# Patient Record
Sex: Male | Born: 1960 | Race: White | Hispanic: No | Marital: Single | Smoking: Current every day smoker
Health system: Southern US, Community
[De-identification: ages and names within clinical notes are randomized; demographics above are authoritative.]

## PROBLEM LIST (undated history)

## (undated) DIAGNOSIS — R519 Headache, unspecified: Secondary | ICD-10-CM

## (undated) DIAGNOSIS — K2210 Ulcer of esophagus without bleeding: Secondary | ICD-10-CM

## (undated) DIAGNOSIS — R972 Elevated prostate specific antigen [PSA]: Secondary | ICD-10-CM

## (undated) DIAGNOSIS — D126 Benign neoplasm of colon, unspecified: Secondary | ICD-10-CM

## (undated) DIAGNOSIS — K219 Gastro-esophageal reflux disease without esophagitis: Secondary | ICD-10-CM

## (undated) DIAGNOSIS — I2510 Atherosclerotic heart disease of native coronary artery without angina pectoris: Secondary | ICD-10-CM

## (undated) DIAGNOSIS — Z720 Tobacco use: Secondary | ICD-10-CM

## (undated) DIAGNOSIS — I739 Peripheral vascular disease, unspecified: Secondary | ICD-10-CM

## (undated) DIAGNOSIS — M1990 Unspecified osteoarthritis, unspecified site: Secondary | ICD-10-CM

## (undated) DIAGNOSIS — Z8719 Personal history of other diseases of the digestive system: Secondary | ICD-10-CM

## (undated) DIAGNOSIS — J449 Chronic obstructive pulmonary disease, unspecified: Secondary | ICD-10-CM

## (undated) DIAGNOSIS — R634 Abnormal weight loss: Secondary | ICD-10-CM

## (undated) DIAGNOSIS — E119 Type 2 diabetes mellitus without complications: Secondary | ICD-10-CM

## (undated) DIAGNOSIS — E785 Hyperlipidemia, unspecified: Secondary | ICD-10-CM

## (undated) DIAGNOSIS — M549 Dorsalgia, unspecified: Secondary | ICD-10-CM

## (undated) DIAGNOSIS — I10 Essential (primary) hypertension: Secondary | ICD-10-CM

## (undated) DIAGNOSIS — Z8739 Personal history of other diseases of the musculoskeletal system and connective tissue: Secondary | ICD-10-CM

## (undated) HISTORY — PX: LUMBAR DISC SURGERY: SHX700

## (undated) HISTORY — DX: Ulcer of esophagus without bleeding: K22.10

## (undated) HISTORY — DX: Peripheral vascular disease, unspecified: I73.9

## (undated) HISTORY — PX: BACK SURGERY: SHX140

## (undated) HISTORY — DX: Tobacco use: Z72.0

## (undated) HISTORY — PX: SHOULDER SURGERY: SHX246

## (undated) HISTORY — PX: LUMBAR FUSION: SHX111

## (undated) HISTORY — PX: PELVIC ABCESS DRAINAGE: SHX2189

## (undated) HISTORY — DX: Hyperlipidemia, unspecified: E78.5

## (undated) HISTORY — PX: TOTAL HIP ARTHROPLASTY: SHX124

## (undated) HISTORY — DX: Elevated prostate specific antigen (PSA): R97.20

## (undated) HISTORY — DX: Abnormal weight loss: R63.4

## (undated) HISTORY — DX: Chronic obstructive pulmonary disease, unspecified: J44.9

## (undated) HISTORY — DX: Dorsalgia, unspecified: M54.9

## (undated) HISTORY — DX: Benign neoplasm of colon, unspecified: D12.6

## (undated) HISTORY — DX: Atherosclerotic heart disease of native coronary artery without angina pectoris: I25.10

---

## 2001-05-15 ENCOUNTER — Emergency Department (HOSPITAL_COMMUNITY): Admission: EM | Admit: 2001-05-15 | Discharge: 2001-05-15 | Payer: Self-pay | Admitting: Emergency Medicine

## 2001-05-15 ENCOUNTER — Encounter: Payer: Self-pay | Admitting: Emergency Medicine

## 2001-05-16 ENCOUNTER — Encounter (HOSPITAL_COMMUNITY): Admission: RE | Admit: 2001-05-16 | Discharge: 2001-06-15 | Payer: Self-pay | Admitting: Emergency Medicine

## 2001-05-23 ENCOUNTER — Ambulatory Visit (HOSPITAL_COMMUNITY): Admission: RE | Admit: 2001-05-23 | Discharge: 2001-05-23 | Payer: Self-pay | Admitting: Internal Medicine

## 2001-05-24 DIAGNOSIS — K2210 Ulcer of esophagus without bleeding: Secondary | ICD-10-CM

## 2001-05-24 HISTORY — DX: Ulcer of esophagus without bleeding: K22.10

## 2001-05-24 HISTORY — PX: ESOPHAGOGASTRODUODENOSCOPY: SHX1529

## 2001-06-12 ENCOUNTER — Encounter: Admission: RE | Admit: 2001-06-12 | Discharge: 2001-09-10 | Payer: Self-pay | Admitting: Pulmonary Disease

## 2001-07-15 ENCOUNTER — Encounter: Payer: Self-pay | Admitting: Emergency Medicine

## 2001-07-15 ENCOUNTER — Emergency Department (HOSPITAL_COMMUNITY): Admission: EM | Admit: 2001-07-15 | Discharge: 2001-07-15 | Payer: Self-pay | Admitting: Emergency Medicine

## 2001-10-06 ENCOUNTER — Ambulatory Visit (HOSPITAL_COMMUNITY): Admission: RE | Admit: 2001-10-06 | Discharge: 2001-10-06 | Payer: Self-pay | Admitting: Pulmonary Disease

## 2001-10-17 ENCOUNTER — Ambulatory Visit (HOSPITAL_COMMUNITY): Admission: RE | Admit: 2001-10-17 | Discharge: 2001-10-17 | Payer: Self-pay | Admitting: Pulmonary Disease

## 2001-10-22 ENCOUNTER — Encounter: Payer: Self-pay | Admitting: Neurosurgery

## 2001-10-24 ENCOUNTER — Observation Stay (HOSPITAL_COMMUNITY): Admission: RE | Admit: 2001-10-24 | Discharge: 2001-10-25 | Payer: Self-pay | Admitting: Neurosurgery

## 2001-10-24 ENCOUNTER — Encounter: Payer: Self-pay | Admitting: Neurosurgery

## 2002-01-02 ENCOUNTER — Ambulatory Visit (HOSPITAL_COMMUNITY): Admission: RE | Admit: 2002-01-02 | Discharge: 2002-01-02 | Payer: Self-pay | Admitting: Neurosurgery

## 2002-01-02 ENCOUNTER — Encounter: Payer: Self-pay | Admitting: Neurosurgery

## 2002-01-08 ENCOUNTER — Encounter (HOSPITAL_COMMUNITY): Admission: RE | Admit: 2002-01-08 | Discharge: 2002-02-07 | Payer: Self-pay | Admitting: Neurosurgery

## 2002-03-23 ENCOUNTER — Emergency Department (HOSPITAL_COMMUNITY): Admission: EM | Admit: 2002-03-23 | Discharge: 2002-03-23 | Payer: Self-pay | Admitting: Emergency Medicine

## 2002-03-23 ENCOUNTER — Encounter: Payer: Self-pay | Admitting: Emergency Medicine

## 2002-04-27 ENCOUNTER — Ambulatory Visit (HOSPITAL_COMMUNITY): Admission: RE | Admit: 2002-04-27 | Discharge: 2002-04-27 | Payer: Self-pay | Admitting: Neurosurgery

## 2002-04-27 ENCOUNTER — Encounter: Payer: Self-pay | Admitting: Neurosurgery

## 2002-06-17 ENCOUNTER — Encounter: Payer: Self-pay | Admitting: Neurosurgery

## 2002-06-17 ENCOUNTER — Encounter: Admission: RE | Admit: 2002-06-17 | Discharge: 2002-06-17 | Payer: Self-pay | Admitting: Neurosurgery

## 2002-07-28 ENCOUNTER — Observation Stay (HOSPITAL_COMMUNITY): Admission: RE | Admit: 2002-07-28 | Discharge: 2002-07-29 | Payer: Self-pay | Admitting: Neurosurgery

## 2002-07-28 ENCOUNTER — Encounter: Payer: Self-pay | Admitting: Neurosurgery

## 2002-08-02 ENCOUNTER — Emergency Department (HOSPITAL_COMMUNITY): Admission: EM | Admit: 2002-08-02 | Discharge: 2002-08-02 | Payer: Self-pay | Admitting: Emergency Medicine

## 2002-08-02 ENCOUNTER — Encounter: Payer: Self-pay | Admitting: Emergency Medicine

## 2002-08-03 ENCOUNTER — Encounter: Payer: Self-pay | Admitting: Internal Medicine

## 2002-08-03 ENCOUNTER — Ambulatory Visit (HOSPITAL_COMMUNITY): Admission: RE | Admit: 2002-08-03 | Discharge: 2002-08-03 | Payer: Self-pay | Admitting: Internal Medicine

## 2002-09-09 ENCOUNTER — Ambulatory Visit (HOSPITAL_COMMUNITY): Admission: RE | Admit: 2002-09-09 | Discharge: 2002-09-09 | Payer: Self-pay | Admitting: Pulmonary Disease

## 2002-09-23 ENCOUNTER — Ambulatory Visit (HOSPITAL_COMMUNITY): Admission: RE | Admit: 2002-09-23 | Discharge: 2002-09-23 | Payer: Self-pay | Admitting: Neurosurgery

## 2002-09-23 ENCOUNTER — Encounter: Payer: Self-pay | Admitting: Neurosurgery

## 2002-10-07 ENCOUNTER — Ambulatory Visit (HOSPITAL_COMMUNITY): Admission: RE | Admit: 2002-10-07 | Discharge: 2002-10-07 | Payer: Self-pay | Admitting: Pulmonary Disease

## 2003-03-23 ENCOUNTER — Ambulatory Visit (HOSPITAL_COMMUNITY): Admission: RE | Admit: 2003-03-23 | Discharge: 2003-03-23 | Payer: Self-pay | Admitting: Neurosurgery

## 2003-03-23 ENCOUNTER — Encounter: Payer: Self-pay | Admitting: Neurosurgery

## 2003-03-28 ENCOUNTER — Encounter: Payer: Self-pay | Admitting: Emergency Medicine

## 2003-03-28 ENCOUNTER — Emergency Department (HOSPITAL_COMMUNITY): Admission: EM | Admit: 2003-03-28 | Discharge: 2003-03-28 | Payer: Self-pay | Admitting: Emergency Medicine

## 2003-07-07 ENCOUNTER — Encounter
Admission: RE | Admit: 2003-07-07 | Discharge: 2003-10-05 | Payer: Self-pay | Admitting: Physical Medicine and Rehabilitation

## 2004-02-01 ENCOUNTER — Ambulatory Visit (HOSPITAL_COMMUNITY): Admission: RE | Admit: 2004-02-01 | Discharge: 2004-02-01 | Payer: Self-pay | Admitting: Neurosurgery

## 2004-02-16 ENCOUNTER — Observation Stay (HOSPITAL_COMMUNITY): Admission: RE | Admit: 2004-02-16 | Discharge: 2004-02-17 | Payer: Self-pay | Admitting: Neurosurgery

## 2004-12-28 ENCOUNTER — Ambulatory Visit (HOSPITAL_COMMUNITY): Admission: RE | Admit: 2004-12-28 | Discharge: 2004-12-28 | Payer: Self-pay | Admitting: Neurosurgery

## 2005-01-25 ENCOUNTER — Encounter: Admission: RE | Admit: 2005-01-25 | Discharge: 2005-01-25 | Payer: Self-pay | Admitting: Neurosurgery

## 2005-04-11 ENCOUNTER — Emergency Department (HOSPITAL_COMMUNITY): Admission: EM | Admit: 2005-04-11 | Discharge: 2005-04-11 | Payer: Self-pay | Admitting: Emergency Medicine

## 2005-06-08 ENCOUNTER — Ambulatory Visit (HOSPITAL_COMMUNITY): Admission: RE | Admit: 2005-06-08 | Discharge: 2005-06-08 | Payer: Self-pay | Admitting: Pulmonary Disease

## 2005-09-28 ENCOUNTER — Ambulatory Visit (HOSPITAL_COMMUNITY): Admission: RE | Admit: 2005-09-28 | Discharge: 2005-09-28 | Payer: Self-pay | Admitting: Neurosurgery

## 2005-11-06 ENCOUNTER — Ambulatory Visit (HOSPITAL_COMMUNITY): Admission: RE | Admit: 2005-11-06 | Discharge: 2005-11-06 | Payer: Self-pay | Admitting: Neurosurgery

## 2006-02-25 ENCOUNTER — Ambulatory Visit (HOSPITAL_COMMUNITY): Admission: RE | Admit: 2006-02-25 | Discharge: 2006-02-25 | Payer: Self-pay | Admitting: Pulmonary Disease

## 2008-09-13 ENCOUNTER — Ambulatory Visit (HOSPITAL_COMMUNITY): Admission: RE | Admit: 2008-09-13 | Discharge: 2008-09-13 | Payer: Self-pay | Admitting: Pulmonary Disease

## 2008-10-04 ENCOUNTER — Ambulatory Visit (HOSPITAL_COMMUNITY): Admission: RE | Admit: 2008-10-04 | Discharge: 2008-10-04 | Payer: Self-pay | Admitting: Neurosurgery

## 2008-10-12 ENCOUNTER — Inpatient Hospital Stay (HOSPITAL_COMMUNITY): Admission: RE | Admit: 2008-10-12 | Discharge: 2008-10-14 | Payer: Self-pay | Admitting: Neurosurgery

## 2008-11-17 ENCOUNTER — Encounter (HOSPITAL_COMMUNITY): Admission: RE | Admit: 2008-11-17 | Discharge: 2008-12-17 | Payer: Self-pay | Admitting: Neurosurgery

## 2009-03-11 ENCOUNTER — Emergency Department (HOSPITAL_COMMUNITY): Admission: EM | Admit: 2009-03-11 | Discharge: 2009-03-11 | Payer: Self-pay | Admitting: Internal Medicine

## 2009-07-13 ENCOUNTER — Ambulatory Visit (HOSPITAL_COMMUNITY): Admission: RE | Admit: 2009-07-13 | Discharge: 2009-07-13 | Payer: Self-pay | Admitting: Orthopedic Surgery

## 2009-09-26 ENCOUNTER — Emergency Department (HOSPITAL_COMMUNITY): Admission: EM | Admit: 2009-09-26 | Discharge: 2009-09-26 | Payer: Self-pay | Admitting: Emergency Medicine

## 2010-08-27 ENCOUNTER — Encounter: Payer: Self-pay | Admitting: Orthopedic Surgery

## 2010-08-27 ENCOUNTER — Encounter: Payer: Self-pay | Admitting: Neurosurgery

## 2010-09-13 ENCOUNTER — Inpatient Hospital Stay (HOSPITAL_COMMUNITY)
Admission: EM | Admit: 2010-09-13 | Discharge: 2010-09-17 | DRG: 728 | Disposition: A | Discharge status: Home Health | Payer: Medicare Other | Attending: Pulmonary Disease | Admitting: Pulmonary Disease

## 2010-09-13 DIAGNOSIS — J449 Chronic obstructive pulmonary disease, unspecified: Secondary | ICD-10-CM | POA: Diagnosis present

## 2010-09-13 DIAGNOSIS — M542 Cervicalgia: Secondary | ICD-10-CM | POA: Diagnosis present

## 2010-09-13 DIAGNOSIS — J4489 Other specified chronic obstructive pulmonary disease: Secondary | ICD-10-CM | POA: Diagnosis present

## 2010-09-13 DIAGNOSIS — N498 Inflammatory disorders of other specified male genital organs: Principal | ICD-10-CM | POA: Diagnosis present

## 2010-09-13 DIAGNOSIS — G8929 Other chronic pain: Secondary | ICD-10-CM | POA: Diagnosis present

## 2010-09-13 DIAGNOSIS — M549 Dorsalgia, unspecified: Secondary | ICD-10-CM | POA: Diagnosis present

## 2010-09-13 DIAGNOSIS — E119 Type 2 diabetes mellitus without complications: Secondary | ICD-10-CM | POA: Diagnosis present

## 2010-09-13 DIAGNOSIS — F17200 Nicotine dependence, unspecified, uncomplicated: Secondary | ICD-10-CM | POA: Diagnosis present

## 2010-09-13 LAB — URINALYSIS, ROUTINE W REFLEX MICROSCOPIC
Hgb urine dipstick: NEGATIVE
Specific Gravity, Urine: 1.015 (ref 1.005–1.030)
Urine Glucose, Fasting: >1000 mg/dL — AB
Urobilinogen, UA: 0.2 mg/dL (ref 0.0–1.0)
pH: 5.5 (ref 5.0–8.0)

## 2010-09-13 LAB — CBC
HCT: 42.7 % (ref 39.0–52.0)
RDW: 12.4 % (ref 11.5–15.5)

## 2010-09-13 LAB — DIFFERENTIAL
Basophils Relative: 0 % (ref 0–1)
Eosinophils Absolute: 0.2 10*3/uL (ref 0.0–0.7)
Eosinophils Relative: 2 % (ref 0–5)
Lymphocytes Relative: 21 % (ref 12–46)

## 2010-09-13 LAB — URINE MICROSCOPIC-ADD ON

## 2010-09-13 LAB — BASIC METABOLIC PANEL
Calcium: 9.1 mg/dL (ref 8.4–10.5)
GFR calc Af Amer: >60 mL/min (ref >60)
GFR calc non Af Amer: >60 mL/min (ref >60)
Glucose, Bld: 241 mg/dL — ABNORMAL HIGH (ref 70–99)
Sodium: 134 mEq/L — ABNORMAL LOW (ref 135–145)

## 2010-09-14 LAB — GLUCOSE, CAPILLARY
Glucose-Capillary: 333 mg/dL — ABNORMAL HIGH (ref 70–99)
Glucose-Capillary: 271 mg/dL — ABNORMAL HIGH (ref 70–99)

## 2010-09-15 LAB — GLUCOSE, CAPILLARY
Glucose-Capillary: 196 mg/dL — ABNORMAL HIGH (ref 70–99)
Glucose-Capillary: 224 mg/dL — ABNORMAL HIGH (ref 70–99)
Glucose-Capillary: 183 mg/dL — ABNORMAL HIGH (ref 70–99)
Glucose-Capillary: 280 mg/dL — ABNORMAL HIGH (ref 70–99)

## 2010-09-15 LAB — SURGICAL PCR SCREEN: Staphylococcus aureus: NEGATIVE

## 2010-09-16 LAB — GLUCOSE, CAPILLARY: Glucose-Capillary: 231 mg/dL — ABNORMAL HIGH (ref 70–99)

## 2010-09-17 LAB — CULTURE, ROUTINE-ABSCESS

## 2010-09-19 LAB — CULTURE, BLOOD (ROUTINE X 2): Culture: NO GROWTH

## 2010-09-20 LAB — ANAEROBIC CULTURE

## 2010-09-27 NOTE — Op Note (Signed)
  NAMEAADIN, GAUT                 ACCOUNT NO.:  [id number]  MEDICAL RECORD NO.:  [id number]           PATIENT TYPE:  I  LOCATION:  A308                          FACILITY:  [facility]  PHYSICIAN:  Ky Barban, M.D.DATE OF BIRTH:  Dec 05, 1960  DATE OF PROCEDURE: DATE OF DISCHARGE:                              OPERATIVE REPORT   PREOPERATIVE DIAGNOSIS:  Left scrotal abscess.  POSTOPERATIVE DIAGNOSIS:  Left scrotal abscess.  PROCEDURE:  I and D of scrotal abscess.  ANESTHESIA:  General.  PROCEDURE IN DETAIL:  The patient under general anesthesia after usual prep and drape.  The fluctuant mass which is in the left scrotal upper end was incised, purulent material about 10 mL came out, sent for cultures.  There is lot of induration around it, but no other collection of abscess.  The wound was packed.  Dressing applied.  The patient left the operating room in satisfactory condition.     Ky Barban, M.D.     MIJ/MEDQ  D:  09/15/2010  T:  09/16/2010  Job:  [id number]  Electronically Signed by Alleen Borne M.D. on 09/27/2010 11:56:28 AM

## 2010-09-27 NOTE — Consult Note (Signed)
  [name], [name]                 ACCOUNT NO.:  [id number]  MEDICAL RECORD NO.:  [id number]           PATIENT TYPE:  LOCATION:                                 FACILITY:  PHYSICIAN:  Ky Barban, M.D.    DATE OF BIRTH:  DATE OF CONSULTATION:  09/14/2010 DATE OF DISCHARGE:                                CONSULTATION   CHIEF COMPLAINT:  Left scrotal abscess.  HISTORY:  A 50 year old gentleman presented with swelling of his left hemiscrotum over the last 2-3 days, came to the emergency room, he is diabetic, takes oral medications, and he is admitted in the hospital for further management and I was asked to see him today.  No fever, chills or history of any urological problem; only problem he has non-insulin dependent diabetes.  PHYSICAL EXAMINATION:  VITAL SIGNS:  Blood pressure 150/80 and temperature is normal. ABDOMEN:  Soft, flat.  Liver, spleen, kidneys not palpable.  No CVA tenderness. EXTERNAL GENITALIA:  Circumcised, meatus adequate.  Testicles feeling normal.  There is tenderness and swelling of the left scrotum near its junction with the left thigh and there is a fluctuant mass with some redness on top and draining sinus.  Testicle appears to be normal. RECTAL:  Deferred.  LAB DATA:  WBC count is 11,000, hematocrit 42.7.  PAST SURGICAL HISTORY:  Surgery on his right shoulder.  SOCIAL HISTORY:  He does smoke, occasionally drinks.  IMPRESSION:  Left scrotal abscess.  Culture from the site has been done which is pending and I want to give him antibiotic today.  We just started him on insulin also.  I will do an incision and drainage of this abscess, although it is not a big abscess, but it needs to be drained, so that it can start healing.  He has been started on vancomycin. Continue vancomycin until the culture report comes back.  I appreciate Dr. Juanetta Gosling for letting to see this patient.     Ky Barban, M.D.     MIJ/MEDQ  D:  09/14/2010  T:   09/15/2010  Job:  [id number]  cc:   Ramon Dredge L. Juanetta Gosling, M.D. Fax: [phone]  Electronically Signed by Alleen Borne M.D. on 09/27/2010 11:56:24 AM

## 2010-10-02 NOTE — Progress Notes (Signed)
  NAMEAVROHOM, MCKELVIN                 ACCOUNT NO.:  [id number]  MEDICAL RECORD NO.:  [id number]           PATIENT TYPE:  LOCATION:                                 FACILITY:  PHYSICIAN:  Lawernce Earll L. Juanetta Gosling, M.D.DATE OF BIRTH:  1961-07-19  DATE OF PROCEDURE: DATE OF DISCHARGE:                                PROGRESS NOTE   Mr. Towell says he feels great and wants to go home.  He has had very little drainage from his wound.  His exam shows temperature is 97.7, pulse 61, respirations 18, blood pressure 153/83, and O2 sats 90% on room air.  His chest is clear.  His heart is regular.  His abdomen is soft.  ASSESSMENT:  I think it is okay for him to be discharged.  He will have daily dressings by [facility], continue with his meds and treatments. I have already done his discharge summary yesterday.  There are no changes.     Wayne Wicklund L. Juanetta Gosling, M.D.     ELH/MEDQ  D:  09/17/2010  T:  09/18/2010  Job:  [id number]  Electronically Signed by Kari Baars M.D. on 10/02/2010 08:48:15 AM

## 2010-10-02 NOTE — Discharge Summary (Signed)
  NAMELATRAVION, GRAVES                 ACCOUNT NO.:  [id number]  MEDICAL RECORD NO.:  [id number]           PATIENT TYPE:  I  LOCATION:  A308                          FACILITY:  [facility]  PHYSICIAN:  Graysen Woodyard L. Juanetta Gosling, M.D.DATE OF BIRTH:  1961-01-11  DATE OF ADMISSION:  09/13/2010 DATE OF DISCHARGE:  LH                              DISCHARGE SUMMARY   HISTORY:  Mr. Mages is a 50 year old who came to the emergency room because of scrotal abscess.  FINAL DISCHARGE DIAGNOSES: 1. Recurrent scrotal abscess. 2. Diabetes. 3. Chronic back and neck pain, status post multiple surgeries. 4. Chronic obstructive pulmonary disease. 5. History of osteoarthritis of the shoulder, status post surgery.  Mr. Eberwein is a 50 year old who came to the emergency room with a scrotal abscess.  He has had these in the past.  He had called my office about 2 or 3 days ago, was started on doxycycline because he was not able to get to the office, but he has continued having trouble, came to the emergency room and was found to have a scrotal abscess.  His exam showed a well-developed, well-nourished male, in no acute distress.  His temperature was 101.  His neck was supple.  His chest was relatively clear with some rhonchi.  Heart was regular in abscessing his scrotum.  Assessment then is that he had a scrotal abscess.  He was started on intravenous antibiotics, had consultation with Dr. Jerre Simon, the urologist and was taken to surgery with drainage of the abscess on the pants.  He has much improved since then and is being discharged home on Augmentin 875 mg b.i.d. x7 days, Actos 45 mg daily, Crestor 20 mg daily, glyburide 5 mg t.i.d., metformin 2 tablets b.i.d. 500 mg and Tylox 5/500 four times a day as needed for pain.  He is learned how to give himself injections of insulin while he was in the hospital as needed and he may need to do that at a later date.  At this point, he is much better and he is being discharged  home in improved condition.     Louden Houseworth L. Juanetta Gosling, M.D.     ELH/MEDQ  D:  09/16/2010  T:  09/17/2010  Job:  [id number]  Electronically Signed by Kari Baars M.D. on 10/02/2010 08:47:55 AM

## 2010-10-02 NOTE — Progress Notes (Signed)
  NAMEJAIDEEP, POLLACK                 ACCOUNT NO.:  [id number]  MEDICAL RECORD NO.:  [id number]           PATIENT TYPE:  I  LOCATION:  A308                          FACILITY:  [facility]  PHYSICIAN:  Daquana Paddock L. Juanetta Gosling, M.D.DATE OF BIRTH:  26-May-1961  DATE OF PROCEDURE: DATE OF DISCHARGE:                                PROGRESS NOTE   Mr. Dingee was admitted last night with what appears to be a scrotal abscess.  He is diabetic, has chronic low back pain, he has some COPD. He says he feels a little better.  He is still having significant pain.  PHYSICAL EXAMINATION:  GENERAL:  His exam shows that he looks a little more comfortable. VITAL SIGNS:  His temperature is 97.5, pulse 57, respirations 18, blood pressure 126/75, O2 sat is 92% on room air. CHEST:  Clear.  He does look more comfortable.  ASSESSMENT:  I think he is better, but still with problems.  He has diabetes, which is being treated.  He has chronic obstructive pulmonary disease, tobacco use disorder.  The abscess, and I have asked for Urology consultation.     Yoshiye Kraft L. Juanetta Gosling, M.D.     ELH/MEDQ  D:  09/14/2010  T:  09/15/2010  Job:  [id number]  Electronically Signed by Kari Baars M.D. on 10/02/2010 08:48:03 AM

## 2010-10-02 NOTE — Progress Notes (Signed)
  [name], [name]                 ACCOUNT NO.:  [id number]  MEDICAL RECORD NO.:  [id number]           PATIENT TYPE:  I  LOCATION:  A308                          FACILITY:  [facility]  PHYSICIAN:  Carlesha Seiple L. Juanetta Gosling, M.D.DATE OF BIRTH:  28-Jan-1961  DATE OF PROCEDURE:  09/15/2010 DATE OF DISCHARGE:                                PROGRESS NOTE   [name] is set for his surgery.  He is to have drainage of the scrotal abscess.  He has no new complaints.  Says he is feeling okay.  He is still having some pain in his groin.  His exam shows his temperature is 97.8, pulse 72, respirations 16, blood pressure 136/86, and O2 sats 98% on room air.  He is awake and alert, looks fairly comfortable.  His chest shows rhonchi bilaterally.  His heart is regular without gallop. His abdomen is soft.  ASSESSMENT:  I think he is overall somewhat better.  He is continuing to receive IV antibiotics, etc.  He is going to have surgery later today.     Linton Stolp L. Juanetta Gosling, M.D.     ELH/MEDQ  D:  09/16/2010  T:  09/16/2010  Job:  [id number]  Electronically Signed by Kari Baars M.D. on 10/02/2010 08:48:07 AM

## 2010-10-02 NOTE — Progress Notes (Signed)
  NAMELINVILLE, DECAROLIS                 ACCOUNT NO.:  [id number]  MEDICAL RECORD NO.:  [id number]           PATIENT TYPE:  LOCATION:                                 FACILITY:  PHYSICIAN:  Daimien Patmon L. Juanetta Gosling, M.D.DATE OF BIRTH:  10/18/1960  DATE OF PROCEDURE: DATE OF DISCHARGE:                                PROGRESS NOTE   [name] is better, he says.  He is hopeful that he is going to be discharged home and I will go ahead and make arrangements in case Dr. Jerre Simon thinks it is okay for him to go.  I am not sure how long he is going to remain in the hospital after his surgery, so depending on what Dr. Rudean Haskell opinion is, I may have him able to be discharged later today.  He says he feels well.  His blood sugars are better.  He has been doing diabetic teaching and he has learned how to give himself insulin if that is needed at home.  He has much less problems with his sugar now.  His breathing is doing okay.  He has no other new complaints.  His exam shows that he is awake and alert.  His chest is clear.  Heart is regular.  His abdomen is soft.  I did not examine his surgical wound.  ASSESSMENT:  He had surgery for an abscess in his scrotum.  He seems to be doing okay and I do not think there is anything to add at this point. He has diabetes which is stable.  He has COPD which is stable.  My plan then is going to be to have him ready for discharge if he is okay.  Continue with everything else and follow him either at home or here in the hospital depending on whether he is discharged or not.     [name] L. Juanetta Gosling, M.D.     ELH/MEDQ  D:  09/16/2010  T:  09/17/2010  Job:  [id number]  Electronically Signed by Kari Baars M.D. on 10/02/2010 08:48:11 AM

## 2010-10-02 NOTE — H&P (Signed)
  [name], [name]                 ACCOUNT NO.:  [id number]  MEDICAL RECORD NO.:  [id number]           PATIENT TYPE:  I  LOCATION:  A308                          FACILITY:  [facility]  PHYSICIAN:  Anivea Velasques L. Juanetta Gosling, M.D.DATE OF BIRTH:  October 31, 1960  DATE OF ADMISSION:  09/13/2010 DATE OF DISCHARGE:  LH                             HISTORY & PHYSICAL   REASON FOR ADMISSION:  Scrotal abscess.  HISTORY:  [name] is a 50 year old who came to the emergency room because of a scrotal abscess.  He has had this for several days.  He called my office, was advised to come in and was started on doxycycline in the meantime.  Yesterday, he said he got up and his abscess burst so we thought it did not need any further attention, but then throughout today he had more fevers, his blood sugar has gone and he has had increasing problems with his blood sugar.  He says it is very painful.  His past medical history is positive for diabetes, chronic back and neck pain.  He has had multiple surgeries on his back and neck.  He has a history of COPD and he also has surgery on his right shoulder.  His social history shows that he smokes about a packet of cigarettes daily.  Drinks alcohol occasionally.  He is not married.  His review of systems except as mentioned is negative.  His medication list is not available yet.  His physical examination shows a well-developed, well-nourished male who is in no acute distress.  His temperature is about 101, blood pressure 120/70, pulse 90, respirations 16.  He is afebrile.  His temperature is as mentioned.  His mucous membranes are slightly dry.  His neck is supple without masses.  His chest is fairly clear with some rhonchi. Heart is regular without murmur, gallop or rub.  Abdomen is soft.  He has an abscess.  Assessment then is that he has a abscess in his genital region with him being diabetic.  Of course, this is a quite worrisome, so he is going to be admitted  for IV antibiotics and I will ask for Urology consultation.     Domenique Southers L. Juanetta Gosling, M.D.    ELH/MEDQ  D:  09/13/2010  T:  09/14/2010  Job:  [id number]  Electronically Signed by Kari Baars M.D. on 10/02/2010 08:48:00 AM

## 2010-10-25 LAB — DIFFERENTIAL
Basophils Absolute: 0.1 10*3/uL (ref 0.0–0.1)
Lymphocytes Relative: 24 % (ref 12–46)
Neutro Abs: 5.4 10*3/uL (ref 1.7–7.7)
Neutrophils Relative %: 63 % (ref 43–77)

## 2010-10-25 LAB — POCT CARDIAC MARKERS
CKMB, poc: <1 ng/mL — ABNORMAL LOW (ref 1.0–8.0)
Myoglobin, poc: 23.6 ng/mL (ref 12–200)

## 2010-10-25 LAB — COMPREHENSIVE METABOLIC PANEL
BUN: 17 mg/dL (ref 6–23)
CO2: 27 mEq/L (ref 19–32)
Chloride: 98 mEq/L (ref 96–112)
Creatinine, Ser: 0.83 mg/dL (ref 0.4–1.5)
GFR calc non Af Amer: >60 mL/min (ref >60)
Total Bilirubin: 0.4 mg/dL (ref 0.3–1.2)

## 2010-10-25 LAB — CBC
HCT: 46 % (ref 39.0–52.0)
MCV: 89.4 fL (ref 78.0–100.0)
Platelets: 175 10*3/uL (ref 150–400)
RBC: 5.14 MIL/uL (ref 4.22–5.81)
WBC: 8.6 10*3/uL (ref 4.0–10.5)

## 2010-10-25 LAB — D-DIMER, QUANTITATIVE: D-Dimer, Quant: 0.76 ug/mL-FEU — ABNORMAL HIGH (ref 0.00–0.48)

## 2010-10-31 ENCOUNTER — Inpatient Hospital Stay (HOSPITAL_COMMUNITY)
Admission: EM | Admit: 2010-10-31 | Discharge: 2010-11-02 | DRG: 728 | Disposition: A | Discharge status: Home / Self Care | Payer: Medicare Other | Attending: Pulmonary Disease | Admitting: Pulmonary Disease

## 2010-10-31 DIAGNOSIS — L02219 Cutaneous abscess of trunk, unspecified: Secondary | ICD-10-CM | POA: Diagnosis present

## 2010-10-31 DIAGNOSIS — J449 Chronic obstructive pulmonary disease, unspecified: Secondary | ICD-10-CM | POA: Diagnosis present

## 2010-10-31 DIAGNOSIS — E119 Type 2 diabetes mellitus without complications: Secondary | ICD-10-CM | POA: Diagnosis present

## 2010-10-31 DIAGNOSIS — E785 Hyperlipidemia, unspecified: Secondary | ICD-10-CM | POA: Diagnosis present

## 2010-10-31 DIAGNOSIS — G8929 Other chronic pain: Secondary | ICD-10-CM | POA: Diagnosis present

## 2010-10-31 DIAGNOSIS — N498 Inflammatory disorders of other specified male genital organs: Principal | ICD-10-CM | POA: Diagnosis present

## 2010-10-31 DIAGNOSIS — IMO0002 Reserved for concepts with insufficient information to code with codable children: Secondary | ICD-10-CM | POA: Diagnosis present

## 2010-10-31 DIAGNOSIS — J4489 Other specified chronic obstructive pulmonary disease: Secondary | ICD-10-CM | POA: Diagnosis present

## 2010-10-31 LAB — BASIC METABOLIC PANEL
Calcium: 9.2 mg/dL (ref 8.4–10.5)
Creatinine, Ser: 0.86 mg/dL (ref 0.4–1.5)
GFR calc non Af Amer: >60 mL/min (ref >60)
Glucose, Bld: 390 mg/dL — ABNORMAL HIGH (ref 70–99)
Sodium: 131 mEq/L — ABNORMAL LOW (ref 135–145)

## 2010-10-31 LAB — URINALYSIS, ROUTINE W REFLEX MICROSCOPIC
Bilirubin Urine: NEGATIVE
Ketones, ur: NEGATIVE mg/dL
Nitrite: NEGATIVE
Protein, ur: NEGATIVE mg/dL
Specific Gravity, Urine: 1.02 (ref 1.005–1.030)
Urobilinogen, UA: 0.2 mg/dL (ref 0.0–1.0)

## 2010-10-31 LAB — DIFFERENTIAL
Basophils Absolute: 0 10*3/uL (ref 0.0–0.1)
Basophils Relative: 0 % (ref 0–1)
Eosinophils Relative: 1 % (ref 0–5)
Monocytes Absolute: 1.1 10*3/uL — ABNORMAL HIGH (ref 0.1–1.0)

## 2010-10-31 LAB — CBC
HCT: 45.3 % (ref 39.0–52.0)
MCH: 31.5 pg (ref 26.0–34.0)
MCHC: 34.7 g/dL (ref 30.0–36.0)
RDW: 12.6 % (ref 11.5–15.5)

## 2010-10-31 LAB — URINE MICROSCOPIC-ADD ON

## 2010-11-01 LAB — BASIC METABOLIC PANEL
BUN: 7 mg/dL (ref 6–23)
CO2: 26 mEq/L (ref 19–32)
Chloride: 102 mEq/L (ref 96–112)
Glucose, Bld: 207 mg/dL — ABNORMAL HIGH (ref 70–99)
Potassium: 3.7 mEq/L (ref 3.5–5.1)
Sodium: 136 mEq/L (ref 135–145)

## 2010-11-01 LAB — GLUCOSE, CAPILLARY: Glucose-Capillary: 214 mg/dL — ABNORMAL HIGH (ref 70–99)

## 2010-11-02 LAB — CBC
HCT: 46.2 % (ref 39.0–52.0)
MCV: 91.5 fL (ref 78.0–100.0)
Platelets: 169 10*3/uL (ref 150–400)
RBC: 5.05 MIL/uL (ref 4.22–5.81)
RDW: 12.5 % (ref 11.5–15.5)
WBC: 9 10*3/uL (ref 4.0–10.5)

## 2010-11-02 LAB — GLUCOSE, CAPILLARY
Glucose-Capillary: 203 mg/dL — ABNORMAL HIGH (ref 70–99)
Glucose-Capillary: 98 mg/dL (ref 70–99)

## 2010-11-02 LAB — DIFFERENTIAL
Basophils Absolute: 0 10*3/uL (ref 0.0–0.1)
Lymphocytes Relative: 27 % (ref 12–46)
Lymphs Abs: 2.5 10*3/uL (ref 0.7–4.0)
Neutrophils Relative %: 60 % (ref 43–77)

## 2010-11-04 LAB — CULTURE, ROUTINE-ABSCESS

## 2010-11-05 LAB — CULTURE, BLOOD (ROUTINE X 2): Culture: NO GROWTH

## 2010-11-06 LAB — CULTURE, ROUTINE-ABSCESS

## 2010-11-06 NOTE — Op Note (Signed)
  [name], BAUMGARDNER                 ACCOUNT NO.:  [id number]  MEDICAL RECORD NO.:  [id number]           PATIENT TYPE:  I  LOCATION:  A320                          FACILITY:  [facility]  PHYSICIAN:  Ky Barban, M.D.DATE OF BIRTH:  May 24, 1961  DATE OF PROCEDURE:  11/02/2010 DATE OF DISCHARGE:  11/02/2010                              OPERATIVE REPORT   PREOPERATIVE DIAGNOSIS:  Left scrotal abscess.  POSTOPERATIVE DIAGNOSIS:  Left scrotal abscess.  PROCEDURE:  I and D and packing of his scrotal abscess.  ANESTHESIA:  General.  The patient under general endotracheal anesthesia.  Few weeks ago, he underwent I and D of the same abscess, he was doing fine, but apparently the opening closed on and he developed the abscess again.  He was started on IV vancomycin.  The abscess drainage has subsided.  The swelling is still there.  Redness has subsided.  I went ahead and opened up the previously made incision, that is only 1 or 2 mL of pus-like like fluid came out for which cultures I have taken.  There was some necrotic tissue, which I cleaned off.  The whole area size of a small oranges indurated.  I think it is because of this infection, but I do not feel any other collection.  The wound was packed again with Iodoform gauze, dressing applied and the patient left the operating room in satisfactory condition.     Ky Barban, M.D.     MIJ/MEDQ  D:  11/02/2010  T:  11/03/2010  Job:  [id number]  cc:   Ramon Dredge L. Juanetta Gosling, M.D. Fax: [phone]  Electronically Signed by Alleen Borne M.D. on 11/06/2010 01:10:00 PM

## 2010-11-07 LAB — ANAEROBIC CULTURE

## 2010-11-09 NOTE — Progress Notes (Signed)
  NAMEBRYANN, MCNEALY                 ACCOUNT NO.:  [id number]  MEDICAL RECORD NO.:  [id number]           PATIENT TYPE:  I  LOCATION:  A320                          FACILITY:  [facility]  PHYSICIAN:  Alica Shellhammer L. Juanetta Gosling, M.D.DATE OF BIRTH:  Oct 11, 1960  DATE OF PROCEDURE: DATE OF DISCHARGE:                                PROGRESS NOTE   Mr. Mcnease was admitted last night with another abscess in his scrotum. He has also got some cellulitis of the surrounding tissue.  Overall, he seems to be doing a little bit better.  This morning, he is afebrile with a temperature of 98.1, pulse 74, respirations 20, blood pressure 132/74, O2 sat 94% on room air.  His sugar 214.  Chest is clear.  Heart is regular.  Abdomen soft.  His scrotum is a little better based on the description from last night.  White count is 12,000, hemoglobin 15.7, platelets 149.  ASSESSMENT:  He has got a scrotal abscess.  PLAN:  We are waiting for consultation from Dr. Jerre Simon.  I think he is probably going to have to have surgical drainage again.     Caitlyn Buchanan L. Juanetta Gosling, M.D.     ELH/MEDQ  D:  11/01/2010  T:  11/01/2010  Job:  [id number]  Electronically Signed by Kari Baars M.D. on 11/09/2010 08:50:55 AM

## 2010-11-09 NOTE — Progress Notes (Signed)
  [name], [name]                 ACCOUNT NO.:  [id number]  MEDICAL RECORD NO.:  [id number]           PATIENT TYPE:  I  LOCATION:  A320                          FACILITY:  [facility]  PHYSICIAN:  Tayron Hunnell L. Juanetta Gosling, M.D.DATE OF BIRTH:  1961-04-05  DATE OF PROCEDURE: DATE OF DISCHARGE:                                PROGRESS NOTE   [name] is set for surgery this morning.  He has no new complaints.  PHYSICAL EXAMINATION:  Temperature is 97.6, pulse 67, respirations 20, blood pressure 141/88, O2 sats 90% on room air.  He looks better.  He still has some cellulitis around the area of the abscess, but overall I think he is improved.  ASSESSMENT:  He is better.  PLAN:  He is going to continue with his IV antibiotics, have surgery today.     Aveya Beal L. Juanetta Gosling, M.D.     ELH/MEDQ  D:  11/02/2010  T:  11/02/2010  Job:  [id number]  Electronically Signed by [name] M.D. on 11/09/2010 08:50:58 AM

## 2010-11-09 NOTE — Progress Notes (Signed)
  NAMEORBIN, MAYEUX                 ACCOUNT NO.:  [id number]  MEDICAL RECORD NO.:  [id number]           PATIENT TYPE:  I  LOCATION:  A320                          FACILITY:  [facility]  PHYSICIAN:  Shayli Altemose L. Juanetta Gosling, M.D.DATE OF BIRTH:  [birth year]  DATE OF PROCEDURE: DATE OF DISCHARGE:  11/02/2010                                PROGRESS NOTE   Mr. Coles has had his surgery.  Dr. Jerre Simon said it is okay for him to go home.  He has pain medication and he has doxycycline at home.  He is going to see Dr. Jerre Simon on Monday, so I think that is reasonable.  I will plan to go ahead and discharge him.  Please see discharge summary for details.     Naima Veldhuizen L. Juanetta Gosling, M.D.     ELH/MEDQ  D:  11/02/2010  T:  11/03/2010  Job:  [id number]  Electronically Signed by Kari Baars M.D. on 11/09/2010 08:51:02 AM

## 2010-11-09 NOTE — H&P (Signed)
  NAMEBOONE, GEAR                 ACCOUNT NO.:  [id number]  MEDICAL RECORD NO.:  [id number]           PATIENT TYPE:  I  LOCATION:  A320                          FACILITY:  [facility]  PHYSICIAN:  Melvyn Novas, MDDATE OF BIRTH:  [birth year]  DATE OF ADMISSION:  10/31/2010 DATE OF DISCHARGE:  LH                             HISTORY & PHYSICAL   The patient is a 50 year old white male, patient of Dr. Juanetta Gosling, who was recently hospitalized due to scrotal abscess with an incision and drainage performed by Dr. Jerre Simon and apparently he was doing well, discharged on Bactrim; and over the last 3 days had redness, tenderness, and spreading erythema in the scrotum around the site of incision up to the anterior pubic area.  He denies any burning, dysuria, frequency, or hematuria.  He denies any fever, chills, however, he is admitted for culture, intravenous antibiotics in the form of vancomycin and blood cultures and surgical consult with Urology for possible surgical intervention when they deem clinically appropriate.  PAST MEDICAL HISTORY:  Significant for COPD, type 2 diabetes, chronic back pain, and degenerative disk disease.  PAST SURGICAL HISTORY:  Remarkable for cervical spinal fusion as well as L-spinal fusion preceded by lumbar laminectomy, right shoulder surgery I believe for rotator cuff issues, and aforementioned I and D of the scrotum.  CURRENT MEDICATIONS: 1. Actos 30 mg p.o. daily. 2. Doxycycline 100 mg p.o. b.i.d. started today via telephone. 3. Glyburide 10 mg p.o. daily. 4. Metformin 500 mg p.o. b.i.d. 5. Crestor 10 mg p.o. daily.  He has no known allergies.  SOCIAL HISTORY:  He is married.  He smokes 1-1/2 packs per day for 35 years.  PHYSICAL EXAMINATION:  VITAL SIGNS:  Temperature 98.4, blood pressure 154/88, pulse 93 and regular, respiratory rate is 20, O2 sat is 93%. EYES:  Extraocular movements intact.  Sclerae clear.  Conjunctivae pink. NECK:  No JVD,  no carotid bruits.  No thyromegaly or thyroid bruits. LUNGS:  Prolonged inspiratory and expiratory phase, scattered rhonchi. No rales or wheeze appreciable. HEART:  Regular rhythm.  No murmurs, gallops, heaves, or rubs. ABDOMEN:  Soft and nontender.  Bowel sounds normoactive.  No guarding, rebound, mass, or organomegaly. EXTERNAL GENITALIA:  Anterior pubic area reveals diffuse erythema and tenderness.  Scrotal area reveals swelling, tenderness, erythema, and induration consistent with abscess. EXTREMITIES:  No clubbing, cyanosis, or edema. NEUROLOGIC:  Cranial nerves grossly intact.  The patient moves all four extremities.  Plantars downgoing.  IMPRESSION: 1. Recurrent scrotal abscess. 2. Expanding cellulitis to anterior pubic area. 3. Diabetes. 4. Chronic obstructive pulmonary disease. 5. Hyperlipidemia.  The plan at present is to perform blood cultures, give IV vancomycin per protocol, obtain surgical consultation with Dr. Jerre Simon for consideration of surgical incision and drainage if they deem clinically necessary and monitoring of glucoses.  I will make further recommendations as the database expands.     Melvyn Novas, MD     RMD/MEDQ  D:  10/31/2010  T:  11/01/2010  Job:  [id number]  Electronically Signed by Oval Linsey MD on 11/09/2010 06:47:00 AM

## 2010-11-09 NOTE — Discharge Summary (Signed)
  [name], [name]                 ACCOUNT NO.:  [id number]  MEDICAL RECORD NO.:  [id number]           PATIENT TYPE:  I  LOCATION:  A320                          FACILITY:  [facility]  PHYSICIAN:  Zula Hovsepian L. Juanetta Gosling, M.D.DATE OF BIRTH:  Oct 22, 1960  DATE OF ADMISSION:  10/31/2010 DATE OF DISCHARGE:  03/29/2012LH                              DISCHARGE SUMMARY   FINAL DISCHARGE DIAGNOSES: 1. Recurrent scrotal abscess. 2. Chronic obstructive pulmonary disease. 3. Diabetes. 4. Chronic low back pain. 5. Degenerative disk disease. 6. Hyperlipidemia.  HISTORY:  [name] is a 50 year old who came to the emergency room after having had a scrotal abscess.  He had an I and D done.  He was discharged home on Bactrim, but then about 6 weeks after his discharge he had increasing redness.  Apparently, he has had multiple episodes of scrotal abscess in the past.  Exam on admission showed that he was awake and alert.  Temperature 98.4.  He had swelling of the scrotum with some induration.  HOSPITAL COURSE:  He was started on vancomycin.  Dr. Jerre Simon took him to the operating room on the 29th and drained the area again and said that it was okay for Mr. Akhtar to be discharged home.  If he had antibiotics, pain medication, etc. at home which he does, so I am going to discharge him home on his previous medications which are metformin 500 mg 2 tablets b.i.d., doxycycline 100 mg b.i.d., Crestor 10 mg daily.  It is listed as hydrocodone/APAP, but it is actually Tylox which is oxycodone APAP 5/500 q.i.d. p.r.n. pain, albuterol inhaler on a p.r.n. basis, glyburide 5 mg he takes two in the morning one in the evening and Actos 30 mg daily.  He will follow with Dr. Jerre Simon on November 06, 2010, and he will follow in my office in about 2 weeks.     Pama Roskos L. Juanetta Gosling, M.D.     ELH/MEDQ  D:  11/02/2010  T:  11/03/2010  Job:  [id number]  cc:   Ky Barban, M.D. Fax: [phone]  Electronically Signed by  Kari Baars M.D. on 11/09/2010 08:50:52 AM

## 2010-11-16 LAB — CBC
Hemoglobin: 16.9 g/dL (ref 13.0–17.0)
MCHC: 35.1 g/dL (ref 30.0–36.0)
MCV: 90.5 fL (ref 78.0–100.0)
RBC: 5.31 MIL/uL (ref 4.22–5.81)
WBC: 8 10*3/uL (ref 4.0–10.5)

## 2010-11-16 LAB — BASIC METABOLIC PANEL WITH GFR
BUN: 9 mg/dL (ref 6–23)
CO2: 25 meq/L (ref 19–32)
Calcium: 9.8 mg/dL (ref 8.4–10.5)
Chloride: 103 meq/L (ref 96–112)
Creatinine, Ser: 0.77 mg/dL (ref 0.4–1.5)
GFR calc non Af Amer: >60 mL/min
Glucose, Bld: 189 mg/dL — ABNORMAL HIGH (ref 70–99)
Potassium: 4.6 meq/L (ref 3.5–5.1)
Sodium: 137 meq/L (ref 135–145)

## 2010-11-16 LAB — GLUCOSE, CAPILLARY
Glucose-Capillary: 184 mg/dL — ABNORMAL HIGH (ref 70–99)
Glucose-Capillary: 189 mg/dL — ABNORMAL HIGH (ref 70–99)
Glucose-Capillary: 258 mg/dL — ABNORMAL HIGH (ref 70–99)

## 2010-11-16 LAB — ABO/RH: ABO/RH(D): O POS

## 2010-11-16 LAB — TYPE AND SCREEN: Antibody Screen: NEGATIVE

## 2010-12-19 NOTE — Discharge Summary (Signed)
[name], [name]                 ACCOUNT NO.:  [id number]   MEDICAL RECORD NO.:  [id number]          PATIENT TYPE:  INP   LOCATION:  3011                         FACILITY:  [facility]   PHYSICIAN:  Hilda Lias, M.D.   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   FINAL DIAGNOSIS:  Recurrent L3-L4 herniated disk with osteophyte  compromising the L3 nerve root.   CLINICAL HISTORY:  [name] is a gentleman who in the past underwent  twice diskectomy at the level of L3-4 in the left side extra foramina.  The patient did well, but later he had been complaining of more pain.  X-  ray showed that he has a large osteophytes compromising the L3 nerve  root as well as scar tissue and possibility of recurrent disk.  Because  of that surgery was advised.  Laboratory normal.   COURSE IN THE HOSPITAL:  The patient was taken to surgery and  decompression of the thecal sac as well as the L3-L4 nerve root in the  left side with diskectomy from the level was done.  Pedicle screw was  achieved.  Today, based on 48-hour history, much better.  He feels that  he is ready to go home.  We discharged him, to be followed by me.   CONDITION ON DISCHARGE:  Improvement.   MEDICATIONS:  Percocet, diazepam, and Neurontin.   DIET:  Regular, although he is to have a modified carbohydrate diet.   ACTIVITY:  Not to drive for 3 weeks.   FOLLOWUP:  To be seen by me in 4 weeks.           ______________________________  Hilda Lias, M.D.     EB/MEDQ  D:  10/14/2008  T:  10/14/2008  Job:  [id number]

## 2010-12-19 NOTE — H&P (Signed)
NAMECACE, OSORTO                 ACCOUNT NO.:  [id number]   MEDICAL RECORD NO.:  [id number]          PATIENT TYPE:  INP   LOCATION:  3011                         FACILITY:  [facility]   PHYSICIAN:  Hilda Lias, M.D.   DATE OF BIRTH:  10/16/1960   DATE OF ADMISSION:  10/12/2008  DATE OF DISCHARGE:                              HISTORY & PHYSICAL   [name] is a gentleman who underwent cervical diskectomy for  myelopathy as well as a lumbar diskectomy at the L2-3, x2.  Nevertheless, the patient did well after surgery in the lumbar area, but  he continued to have more pain down to the left leg associated with  squeezing of the left thigh.  He has failed with every single  conservative treatment including epidural injection and medication.  We  did a myelogram which showed that he has a scar tissue __________but a  large osteophyte compromising the L3 nerve root.  Thus he wanted to  proceed with surgery.   PAST MEDICAL HISTORY:  Lumbar diskectomy x2, cervical diskectomy at 3-4  for myelopathy.   REVIEW OF SYSTEMS:  Positive for diabetes, hypertension, chronic  bronchitis.   SOCIAL HISTORY:  Smokes and he does not drink.   FAMILY HISTORY:  Unremarkable.   PHYSICAL EXAMINATION:  HEAD, EARS, NOSE, AND THROAT:  Normal.  NECK:  There is a scar anteriorly.  He has good flexibility.  LUNGS:  There are some mild rhonchi bilaterally.  CARDIOVASCULAR:  Unremarkable.  ABDOMEN:  Unremarkable.  EXTREMITIES:  Unremarkable.  NEUROLOGIC:  He has weakness of the left quadriceps and iliopsoas.  Femoral stretch maneuver is positive on the left side, negative on the  right side.  The MRI and myelogram showed that he has osteophyte in the  level of 3-4, scar tissue __________.   IMPRESSION:  Recurrent 3-4 herniated disk.  Osteophyte compromising the  foramen.   RECOMMENDATIONS:  The patient is to be admitted.  The procedure will be  decompression on the left side with facetectomy, decompression  of the L3  and L4 nerve root and transverse graft.  We are going to use pedicle  screw for L3-L4 bilaterally.  Also, posterolateral __________.  The  patient knows about the risks such as infection, CSF leak, no  improvement whatsoever, and need for surgery.            ______________________________  Hilda Lias, M.D.     EB/MEDQ  D:  10/12/2008  T:  10/13/2008  Job:  [id number]

## 2010-12-19 NOTE — Op Note (Signed)
[name], [name]                 ACCOUNT NO.:  [id number]   MEDICAL RECORD NO.:  [id number]          PATIENT TYPE:  INP   LOCATION:  3011                         FACILITY:  [facility]   PHYSICIAN:  Hilda Lias, M.D.   DATE OF BIRTH:  1961-04-09   DATE OF PROCEDURE:  10/12/2008  DATE OF DISCHARGE:                               OPERATIVE REPORT   PREOPERATIVE DIAGNOSIS:  Recurrent left L3-L4 extraforaminal herniated  disk with a foraminal stenosis secondary to osteophyte scar tissue.   POSTOPERATIVE DIAGNOSIS:  Recurrent left L3-L4 extraforaminal herniated  disk with a foraminal stenosis secondary to osteophyte scar tissue.   PROCEDURES:  1. Left L3 facetectomy.  2. Left L3 hemi-laminectomy.  3. Removal of spinous process.  4. Left L3-L4 total diskectomy with a fusion using a single cage      __________ type with autograft and BMP.  5. Pedicle screws in L3-L4.  6. Posterolateral arthrodesis L3-4 on right side.  7. Lysis of adhesion.  8. Decompression of the L3 and L4 nerve root.  9. Microscope.  10.Cell Saver.  11.C-arm.   SURGEON:  Hilda Lias, MD   ASSISTANT:  Coletta Memos, MD   CLINICAL HISTORY:  Mr. Shrewsberry is a gentleman who in the past underwent  cervical fusion because of myelopathy and lately he has had 2 T3-4  extraforaminal diskectomy.  Nevertheless, he came back with the same  problem of back and left leg pain associated with weakness.  X-rays  showed that he has a recurrent herniated disk associated with scar  tissue and osteophyte compromising the L3 nerve root.  Surgery was  advised and we talked about decompression versus fusion.  At the end, we  agreed because this was the third intervention of the same area to  proceed with fusion.   DESCRIPTION OF PROCEDURE:  The patient was taken to the OR and he was  positioned in a prone manner.  The back was cleaned with DuraPrep.  Midline incision from L2 to L3-L4 was made and muscle was retracted  laterally.  In  the right side we went all the way laterally until we  were able to see the transverse process of L3-4.  On the left side we  went laterally to see the same findings.  At the level of L3-4 in the  left side was mostly quite a bit of scar tissue up to the point there  was almost no anatomy.  We proceeded with removal of the spinous process  of L3, the lamina of L3, and then the facet of L3.  We did lysis of  adhesions.  The L4 nerve root was cleaned easily.  The L3 was more  difficult secondary to adhesion.  We went all the way laterally until we  were able to open up and used lysis of adhesions to decompress the nerve  root.  At the end, there was nothing compromising to this itself,  although quite a bit of scar tissue was still surrounding the nerve.  There was no osteophyte at the end, then we entered the disk space from  the left side.  Total gross diskectomy laterally and medial was  achieved.  Then, a cage of U-shape with autograft and BMP inside was  introduced.  It was 13 x 25.  The rest of this the disk was filled up  with autograft.  Then using the C-arm first in AP view and later on  lateral view, we were able to brought the pedicle L3-4.  At the end, we  introduced 4 screws.  Prior to introduction of the screws, it was felt  the bone itself to be sure that was surrounded by bone.  These screws  were 6.5 x 45.  They were connected with rod and caps..  In the right  side, we went laterally.  We drilled the lateral aspect of the facet of  L3-4 as well as transverse process and autograft and BMP was used for  arthrodesis.  Valsalva maneuver was negative.  The area was irrigated  and closed with Vicryl and Steri-Strips.           ______________________________  Hilda Lias, M.D.     EB/MEDQ  D:  10/12/2008  T:  10/13/2008  Job:  [id number]

## 2010-12-22 NOTE — Op Note (Signed)
[facility]  Patient:    [name], [name] Visit Number: [id number] MRN: [id number]          Service Type: SUR Location: 3000 3018 01 Attending Physician:  Danella Penton Dictated by:   Tanya Nones. Jeral Fruit, M.D. Proc. Date: 10/24/01 Admit Date:  10/24/2001 Discharge Date: 10/25/2001                             Operative Report  PREOPERATIVE DIAGNOSIS:  C3-C4 herniated disc with myelopathy and gliosis of the spinal cord.  POSTOPERATIVE DIAGNOSIS:  C3-C4 herniated disc with myelopathy and gliosis of the spinal cord.  PROCEDURE:  Anterior C3-C4 diskectomy with decompression of spinal cord, Allograft plate and microscopic surgery.  SURGEON:  Tanya Nones. Jeral Fruit, M.D.  ASSISTANT:  Cristi Loron, M.D.  INDICATIONS:  Mr. Bloyd is a 50 year old gentleman who is obese, hypertensive and diuretic who had been seen by me because of weakness in the upper extremity and difficulty to walk.  The x-ray showed that he has a severe case of herniated disc at L3-L4 with changes in the spinal cord.  Surgery was advised.  Risks were explained in the History and Physical.  DESCRIPTION OF PROCEDURE:  The patient was taken to the OR and intubation was done in neutral position.  Traction with 10 pounds was applied to the neck. The left side of the neck was prepped with Betadine and transverse incision was made through the skin, platysma and down to cervical spine.  X-rays showed indeed we were at the level C3-C4.  Then we opened the anterior ligament and with the microscope, we did a total diskectomy.  The patient had quite a bit of bone spurs and some herniated disc going below the posterior ligament.  The posterior ligament was opened and decompression of the spinal cord was achieved with bilateral foraminotomy.  The cord at this level was found to be flat.  Having done this, the end plate was drilled and a piece of bone graft of 8 mm iliac crest was  inserted followed by a plate with four screws.  X-ray of the spine showed good position of the graft and the plate.  From then on, the area was irrigated and closed with Vicryl and Steri-Strips. Dictated by: Charlton Haws. Jeral Fruit, M.D. Attending Physician:  Danella Penton DD:  10/24/01 TD:  10/27/01 Job: [id number]

## 2010-12-22 NOTE — Op Note (Signed)
[facility]  Patient:    [name], REAMER Visit Number: [id number] MRN: [id number]          Service Type: DSU Location: DAY Attending Physician:  Jonathon Bellows Dictated by:   Roetta Sessions, M.D. Admit Date:  05/23/2001   CC:         Kari Baars, M.D.   Operative Report  PROCEDURE:  Diagnostic esophagogastroduodenoscopy.  ENDOSCOPIST:  Roetta Sessions, M.D.  INDICATION FOR PROCEDURE:  Patient is a 50 year old gentleman with a one-week history of progressing odynophagia and dysphagia.  It is notable that this gentleman starting taking Indocin 24 to 48 hours prior to the onset of his symptoms and he may not have gotten one of his Indocin dosings into his stomach the night before the onset of his symptoms, as he took a small sip of water and immediately went to bed.  EGD is now being done to further evaluate his symptoms.  Approach had been discussed with Mr. Finklea in the office yesterday.  Potential risks, benefits and alternatives have been reviewed, questions answered and he is agreeable.  Please see my dictated consultation note for more information.  PROCEDURE NOTE:  O2 saturation, blood pressure, pulse and respirations were monitored throughout the entire procedure.  CONSCIOUS SEDATION:  IV Versed and Demerol; Cetacaine spray for topical oropharyngeal anesthesia.  INSTRUMENT:  Olympus gastroscope.  FINDINGS:  Examination of the tubular esophagus revealed a distal esophageal ulcer, approximately a 5-mm ulcer.  There were multiple distal erosions.  The mucosa of the last 2 cm of the esophagus appeared quite inflamed.  There was no Barretts esophagus; no ring stricture or neoplasm; there was no candidal esophagitis.  More proximal mucosa appeared normal.  EG junction was easily traversed.  Stomach:  Gastric cavity was empty and insufflated well with air.  Thorough examination of the gastric mucosa including a retroflexed view of the  proximal stomach and esophagogastric junction demonstrated only a small-to-moderate-size hiatal hernia.  Pyloric channel was patent and easily traversed.  Duodenum:  The bulb and second portion appeared normal.  Therapeutic/diagnostic maneuvers performed:  None.  The patient tolerated the procedure well and was reacted at endoscopy.  IMPRESSION:  Distal esophageal erosion consistent with an ulcer most consistent with pill-induced injury.  Small-to-moderate-size hiatal hernia. Remainder of upper gastrointestinal tract appeared normal.  RECOMMENDATIONS: 1. Prevacid 30 mg orally daily, contents of each capsule dissolved in 4 ounces    of apple juice, 30 minutes before breakfast. 2. Tylenol No. 3 elixir two teaspoons q.6h. p.r.n. pain. 3. Carafate 1 g slurries q.i.d. x 5 days.  I told Mr. Peruski that his symptoms will likely get better but will take a number of days to steadily and slowly improve.  He should be feeling considerably better, however, in two to three days.  He should refrain from taking Indocin.  In the future, he should take adequate amounts of liquids when ingesting pills and should stay upright for 30 minutes.  His outlook should be excellent.  If he is not much better by the middle of next week, he is to let me know.  I would like to thank Dr. Kari Baars for allowing me to see this nice gentleman today. Dictated by:   Roetta Sessions, M.D. Attending Physician:  Jonathon Bellows DD:  05/24/01 TD:  05/24/01 Job: [id number]

## 2010-12-22 NOTE — Consult Note (Signed)
[facility]  Patient:    [name], [name] Visit Number: [id number] MRN: [id number]          Service Type: REC Location: SPCL Attending Physician:  Hilario Quarry Dictated by:   Roetta Sessions, M.D. Proc. Date: 05/22/01 Admit Date:  05/16/2001   CC:         Kari Baars, M.D.                          Consultation Report  DATE OF BIRTH:  10/30/1960  REASON FOR CONSULTATION:  Dysphagia, odynophagia.  HISTORY OF PRESENT ILLNESS:  [name] is a pleasant 50 year old gentleman referred through the courtesy of Dr. Shaune Pollack to evaluate a one-week history of esophageal dysphagia and prominent symptoms of odynophagia.  [name] tells me he started having a flare of gout in his right ankle a little over one week ago.  Was referred to Dr. Hilda Lias by Dr. Juanetta Gosling, was started on Indocin.  It sounds like within 24-48 hours of being on Indocin, he started having a retrosternal burning and pain, certainly worse when he consumed solids and liquids.  Also perception that food and liquids are hanging behind his xiphoid process although, apparently, he has regurgitated some.  It sounds as though he has been able to get some things down.  He has not had any trouble controlling his oral secretions.  He also tells me that his symptoms started rather acutely one week ago.  He says two weeks ago and prior to that he had absolutely no problems with odynophagia or dysphagia, had rarely had any typical reflux symptoms.  He thinks he has lost some weight over the past week but is unable to quantify it.  Has not had any melena or rectal bleeding.  No diarrhea or constipation. He denies any abdominal pain.  He does have a long-term alcohol and tobacco exposure, in the way of drinking about eight beers weekly and smoking a pack of cigarettes per day and has done so for some time.  PAST MEDICAL HISTORY:  Unremarkable except for recent diagnosis of gout.  PAST  SURGICAL HISTORY:  None.  CURRENT MEDICATIONS: 1. Colchicine 0.6 mg daily. 2. Tylox p.r.n. 3. Indocin 50 mg orally b.i.d.  ALLERGIES:  No known drug allergies.  FAMILY HISTORY:  Negative for chronic GI or liver disease.  Mother and father are alive, in good health.  Does not have any siblings.  No history of GI malignancy.  SOCIAL HISTORY:  The patient is single, has one child.  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  He is from [place].  One pack of cigarettes per day.  Approximately eight beers weekly.  REVIEW OF SYSTEMS:  Does not have any chest pain, palpitations on exertion. No fever, chills.  No yellow jaundice, clay-colored stools, dark-colored urine.  Some possible weight loss during this past one week.  PHYSICAL EXAMINATION:  GENERAL:  Pleasant 50 year old gentleman resting comfortably.  He is somewhat tan.  VITAL SIGNS:  Weight 215 pounds, height 5 feet 11 inches.  Temperature 98.7, BP 130/80, pulse 80.  SKIN:  Tan.  No jaundice.  No cutaneous stigmata of chronic liver disease.  HEENT:  No scleral icterus.  Conjunctivae are pink.  Oral cavity, no lesions. JVD is not prominent.  CHEST, LUNGS:  Clear to auscultation.  CARDIAC:  Regular rate and rhythm.  Without murmur, gallop, or rub.  ABDOMEN:  Nondistended.  Positive bowel  sounds.  Soft.  He does have subxiphoid tenderness to palpation.  No appreciable mass or organomegaly.  EXTREMITIES:  No edema.  IMPRESSION:  [name] is a pleasant 50 year old gentleman with a one-week history of prominent esophageal dysphagia and odynophagia.  This is at least tentatively associated with initiation of Indocin therapy.  With the acute onset of his symptoms, I am somewhat concerned that somehow he developed a pill impaction, possibly secondary to Indocin, which has produced an esophageal ulcer that would be entirely consistent with his clinical presentation.  Entities such as of esophageal cancer or  peptic stricture would be much less likely given the rather abrupt/acute onset of his symptoms last week.  Odynophagia would be out of proportion for those latter entities as well.  There is no reason to think he has an infectious process such as Candida esophagitis.  He needs an urgent EGD.  RECOMMENDATIONS:  I have offered the patient the EGD tomorrow.  I have discussed the approach with [name].  Potential risks, benefits, alternatives have been reviewed.  Questions answered.  He is agreeable.  I feel he is relatively low risk for conscious sedation with Versed and Demerol. Further recommendations to follow.  I would like to thank Dr. Shaune Pollack for allowing me to see this nice gentleman today. Dictated by:   Roetta Sessions, M.D. Attending Physician:  Hilario Quarry DD:  05/22/01 TD:  05/22/01 Job: [id number]

## 2010-12-22 NOTE — Assessment & Plan Note (Signed)
[name] is back in for a recheck today.   PROBLEM LIST:  1. Status post cervical fusion at C3-4.  2. 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  3. Chronic neck pain.   CURRENT MEDICATIONS:  1. Benicar 20 mg daily.  2. Aciphex 20 mg daily.  3. Norco 7.5/325 q.6h.  4. Actos.   ALLERGIES:  The patient denies any new drug allergies.   The results of [name]' last scan were reviewed.  He had an MRI done  March 23, 2003.  At that time, there was some right C3-4 neuroforaminal  narrowing noted which would be due to the facet hypertrophy.  He also has a  2 mm retrolisthesis at C4-5, increased cord signal was noted at C3-4 which  is consistent with myelopathy, contusion, hemorrhage or infarct.  The  overall results were reviewed by the radiologist.  __________ deformity at  C2-3, status post anterior fusion at C3-4 and the cord myelopathy versus  prior injury at C3-4 with cord atrophy.  Diffuse bulge and end plate spring  at C6-7.   [name] is a 50 year old gentleman whom we are seeing for his neck pain  and left shoulder pain.  He reports he has continued to have pain pretty  much everyday.  He reports its level is approximately an 8 on the average  and stays there pretty much all day without really waxing or waning.  He is  basically independent with his activities of daily living.  He does have  some difficulty buttoning with his left hand because of incoordination.  He  continues to take his pain medications and get some relief from them.  He  denies any suicidal ideation or wish to harm others.  He denies any problems  controlling bowel or bladder.  He denies any new gait problems or balance  problems.  He denies any new numbness, tingling, or weakness.   PHYSICAL EXAMINATION:  Again, he is pleasant and cooperative.  He sits again  with his head somewhat laterally flexed to the right.  He has limitations  mainly with rotation of his neck to the left as well as he is quite  limited  with left lateral flexion as well.  Forward flexion is within normal limits.  His gait is normal in the room.  He is able to tandem walk without any  difficulty.  He is able to walk on heels and toes as well today.  Seated  reflexes in the upper extremity are 2+ overall.  He has 3+ at the right  knee, 2+ at the left knee and 2+ at both Achilles tendons.  Toes are  downgoing.  No clonus is noted.  Sensation is diminished in the left arm, C5-  C8, decreased motor strength was noted in the left external rotators, wrist  extensors as well as biceps.   1. 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  2. Status post C3-4 cervical fusion.  3. Cervical spondylosis.  4. Evidence of cervical myelopathy at C3-4 with increased cord signal per     last MRI.  5. Probable neuropathic as well as nociceptive neck and shoulder pain.   PLAN:  Will continue patient on Norco 7.5/325 q.6h. Will consider adding  Neurontin as well at the next visit.  He would like to have something more  definitively done to alleviate the pain.  Given his weakness in the shoulder  external rotators as well as biceps, will consider a left C5 root injection,  although this gentleman  probably has multiple pain generators in his  cervical spine.   Please note, this gentleman is diabetic and may need close observation of  his blood sugar if procedure involving a steroid is used.      Brantley Stage, M.D.   DMK/MedQ  D:  08/18/2003 12:55:38  T:  08/18/2003 13:36:28  Job #:  [id number]   cc:   Hilda Lias, M.D.  [address]  [place], [place] [address]  Fax: [phone]   Celene Kras, MD  Fax: [phone]   Oneal Deputy. Juanetta Gosling, M.D.  [address]  [place]  [place] [address]  Fax: [phone]

## 2010-12-22 NOTE — Consult Note (Signed)
NAME:  [name], [name]                           ACCOUNT NO.:  [id number]   MEDICAL RECORD NO.:  [id number]                   PATIENT TYPE:  EMS   LOCATION:  ED                                   FACILITY:  [facility]   PHYSICIAN:  Hanley Hays. Dechurch, M.D.           DATE OF BIRTH:  04/20/1961   DATE OF CONSULTATION:  DATE OF DISCHARGE:                                   CONSULTATION   REASON FOR REFERRAL:  Left lower extremity pain.   HISTORY OF PRESENT ILLNESS:  This is a 50 year old Caucasian male who  underwent L5 laminectomy six days prior to presentation who had an  uneventful postoperative course who was ambulatory the same day  postoperatively and has been at home where he has been doing well,  mobilizing around the house. He has noticed some left knee instability.  Yesterday, he developed increasing pain in the left knee which he stated  arose from about mid medial thigh crossing the knee and radiating down the  medial part of the leg. There was no paresthesias. He described as a deep  aching pain. It felt better if he elevated the leg, felt as if the knee was  going to give out as he was walking about. He has had no falls. He has had  no trauma. He had no previous knee problems. He did have left lower  extremity radiculopathy secondary to his herniated disk which has since  resolved since his surgery. He had no obvious swelling to the foot although  he noted the foot felt tight last p.m. but notes no swelling now. He was  referred to evaluate for admission because of his knee pain and the concern  that a DVT may be present.   PAST MEDICAL HISTORY:  The patient has a past medical history of diabetes,  hypertension, history of anterior cervical neck procedure earlier this year  without complication. No previous history of DVT or PE. No clotting  abnormalities. No problems with surgery or general anesthesia.   FAMILY HISTORY:  His family medical history is unremarkable.   MEDICATIONS:  His medications include a blood pressure medication which he  does not recall the name of, Actos, prednisone, and some Percocet which he  used postoperatively.   REVIEW OF SYMPTOMS:  The patient's review of systems is as noted above. He  denies any GI or GU complaints and, until yesterday, no joint complaints.  His glucoses have been well controlled.   ALLERGIES:  The patient has an allergy to Indocin which causes rash.   SOCIAL HISTORY:  The patient smokes one and a half packs per day. No alcohol  use or abuse.   PHYSICAL EXAMINATION:  The exam is limited.  NECK:  Supple.  MENTAL STATUS:  Intact.  LUNGS:  With a few rhonchi, equal breath sounds bilaterally.  HEART:  Regular.  ABDOMEN:  Obese.  EXTREMITIES:  Without clubbing or  cyanosis. Absolutely no edema is present.  There is some crepitance about the left knee. He has full range of motion.  He is able to stand. He has pain when he places his left heel down on the  ground but the strength seems to be relatively well preserved. The distal  pulses of both lower extremities are intact. There is no erythema or  phlebitis noted as well.   LABORATORY DATA:  White count 12.5, hemoglobin 15.8, hematocrit 44. D-dimer  0.26. Electrolytes normal. Glucose 198. X-rays to my review reveal no frank  abnormality, pending radiology review.   ASSESSMENT/PLAN:  Left knee pain. I feel that it is unlikely this presents  DVT, even early DVT. As it is now 0100, we will be able to do an outpatient  Doppler exam at 0700, and if that is negative, then proceed with further  evaluation, either with orthopedics to followup with his neurosurgeon as it  may be related to possible nerve compression although this, too, would be  unlikely. He was given Percocet in a prescription for #20 to take over the  next couple of days until this can be fully evaluated. There did not seem to  be any indication for hospitalization at this time. The plan was  discussed  with the patient who seemed to have reasonably understanding. Again, it was  felt that it was unlikely this presented DVT but is reasonable to fully  assess. He will follow up with his regular medications. He will follow up  with Dr. Juanetta Gosling as well.                                               Hanley Hays Josefine Class, M.D.    FED/MEDQ  D:  08/03/2002  T:  08/03/2002  Job:  [id number]   cc:   M.D. Juanetta Gosling

## 2010-12-22 NOTE — H&P (Signed)
[name], [name]                           ACCOUNT NO.:  [id number]   MEDICAL RECORD NO.:  [id number]                   PATIENT TYPE:  INP   LOCATION:  3010                                 FACILITY:  [facility]   PHYSICIAN:  Hilda Lias, M.D.                DATE OF BIRTH:  05-Apr-1961   DATE OF ADMISSION:  07/28/2002  DATE OF DISCHARGE:                                HISTORY & PHYSICAL   HISTORY OF PRESENT ILLNESS:  The patient came to [place] in the past and  underwent anterior cervical diskectomy.  Lately has been complaining of back  pain with radiation to the leg and he is not any better.  The patient  underwent anterior cervical diskectomy because of myelopathy.  Now he is  complaining of pain down to the left leg which is getting worse.  He feels  that he is getting weak, and he had quite a bit of burning sensation in the  left leg.  We knew by x-ray that he has extraforaminal disk at the level of  L3-4 on the left side.   PAST MEDICAL HISTORY:  Cervical fusion.   ALLERGIES:  No known drug allergies except NONSTEROIDAL ANTI-INFLAMMATORIES.   SOCIAL HISTORY:  He does not smoke and he does not drink.   CURRENT MEDICATIONS:  At the present time, he takes some medication for gout  and GI problems.   REVIEW OF SYSTEMS:  Positive for bronchitis, high blood pressure, diabetes,  and neck pain.   PHYSICAL EXAMINATION:  HEENT:  Normal.  NECK:  He has a well-healed scar on the neck.  He is able to flex 10 degrees  with minimal discomfort.  LUNGS:  Some rhonchi bilaterally.  CARDIAC:  Heart sounds normal.  ABDOMEN:  Normal.  EXTREMITIES:  Normal pulses.  NEUROLOGICAL:  Mental status normal.  Cranial nerves normal.  Strength is  5/5 except for weakness of the left quadriceps and the left iliopsoas.  Reflexes symmetrical with decreased ankle jerk.  Stabilized stretch  maneuver is positive on the left side, negative on the right side.   LABORATORY DATA:  MRI showed that he has a  herniated disk at the level of L3-  4 extraforaminal.   CLINICAL IMPRESSION:  L3-4 extraforaminal herniated disk.   PLAN:  The patient is admitted for surgery. We are going to proceed with L3-  4 extraforaminal diskectomy using the Medrix as well as C-arm.  The risks  were explained to him and the lady who came with him which include no  improvement whatsoever, infection, __________, bleeding, the need for  further surgery, damage to the ureter.  The patient declined more opinions.  Hilda Lias, M.D.    EB/MEDQ  D:  07/28/2002  T:  07/28/2002  Job:  [id number]

## 2010-12-22 NOTE — Group Therapy Note (Signed)
REFERRING PHYSICIAN:  Hilda Lias, M.D.   REASON FOR CONSULTATION:  Neck pain.   CHIEF COMPLAINT:  Left-sided neck pain and arm and hand intermittent  numbness.   [name] is a 50 year old gentleman who presents to our pain and  rehabilitative clinic with complaints of a deep achy pain in the left side  of his neck and some intermittent left upper extremity numbness.  He reports  that sometimes he drops things and does not even know it, such as a  cigarette.   He has been seen by Dr. Jeral Fruit.  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   [name] reports that his pain is fairly constant.  It is about an 8 on the  average of a scale of 10.  Some days it does go up to a 10.  His good days  are about a 6 on a scale of 10.  He feels that his left upper extremity is a  little bit clumsier.  He reports some intermittent weakness and numbness and  tingling into the left upper extremity.  Exacerbating factors include  prolonged sitting.  He improves if he is able to lay down and tilt his head  to the right side.  He has had no physical therapy.  He has had a Medrol  Dose-Pak, which did not help that much.  He has had some studies on his  lumbar spine.  Apparently he has some stenosis and disk disease.   The patient denies any recent trauma.   Activities of daily living are independent.  He is able to walk about 30  minutes twice a day.  He denies any problems with sleeping.  He believes he  is somewhat depressed, but believes he is coping okay with some of his  limitations.   MEDICATIONS:  Medications at this time include:  1. Actos 30 mg daily.  2. Benicar 20 mg daily.  3. Vicodin 7.5/500 mg four to five times a day.  4. Aciphex 20 mg daily.   ALLERGIES:  He reports that he has an intolerance to INDOCIN.   PAST SURGICAL HISTORY:  Remarkable for lumbar surgery back in December of  2003 and  neck fusion in March of 2003.   FAMILY HISTORY:  Remarkable for cancer, diabetes, hypertension, and multiple  neck surgeries with his mother.  Both grandparents have had lung cancer.   SOCIAL HISTORY:  His average day currently involves basically sitting and  intermittently laying down throughout the day.  He is currently not working.  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  He last worked in March of 2003.  He lives with his girlfriend.  He is single.  He occasionally drinks alcohol.  He does not use caffeine.  He has been smoking a pack a day for about 25 years.  Denies illicit or  recreational drug use.  He denies harm to self or others.   PHYSICAL EXAMINATION:  On exam, he was oriented x 3.  He is a well-  developed, well-nourished gentleman.  He appeared in no distress, however,  throughout the interview he did hold his head laterally flexed to the right  throughout the entire time he was here.  This was approximately 40 minutes.  He was cooperative and appropriate, maybe somewhat frustrated with his neck  pain.  The skin was remarkable for  a well-healed scar over the right  anterior part of his neck.  He had extremely limited cervical rotation to  the left.  He lacked about 50% of left lateral flexion.  The rest of his  range of motion was within normal limits.  Reflexes in the upper and lower  extremities were 2+ throughout with the exception of 3+ at the right knee.  Toes were downgoing.  No clonus was noted.  Sensation was diminished in the  C5, C6, C7, and C8 dermatomes on the left and intact throughout the  dermatomes on the left.  Straight leg raise is negative.  Motor strength was  5/5 in both upper and lower extremities.  His gait was normal.  Able to heel  and toe walk without any difficulty.  Romberg test was negative.  Tandem  gait was normal.  Position sense was intact.  He was noted to have atrophy  of his right calf compared  to the left.  No significant tenderness to  palpation in the cervical, paraspinal, and scapular musculature.   IMPRESSION:  1. Status post cervical fusion of C3-4.  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  3. Chronic neck pain.   PLAN:  1. Will obtain previous cervical studies.  2. Will refill medications today.  3. The patient was given a prescription for Norco 7.5/325 mg 124 units with     no refills.  4. Will have him follow back up with me after we obtain his spine films.  We     will review them and possibly consider a selective nerve root infection.  5. Will also give him some samples today of Lidoderm, as well as Biofreeze.     Brantley Stage, M.D.   DMK/MedQ  D:  07/14/2003 17:45:52  T:  07/14/2003 18:58:12  Job #:  [id number]   cc:   Hilda Lias, M.D.  [address]  [place], [place] [address]  Fax: [phone]

## 2010-12-22 NOTE — H&P (Signed)
NAMERON, BESKE                           ACCOUNT NO.:  [id number]   MEDICAL RECORD NO.:  [id number]                   PATIENT TYPE:  INP   LOCATION:  2899                                 FACILITY:  [facility]   PHYSICIAN:  Hilda Lias, M.D.                DATE OF BIRTH:  Jan 29, 1961   DATE OF ADMISSION:  02/16/2004  DATE OF DISCHARGE:                                HISTORY & PHYSICAL   [name] is a gentleman who had been complaining of neck pain and also back  pain with radiation to the left leg.  In the past he underwent fusion at the  level of C3-4 and diskectomy extraforaminal.  Because of persistence of the  pain, we did a cervical and lumbar myelogram.  He feels that the pain at the  left leg is worse.  X-rays show osteophyte plus possible recurrent disk at  the level of C3-4, extraforaminal.  Surgery was advised.   PAST MEDICAL HISTORY:  C3-4 diskectomy, cervical fusion.   REVIEW OF SYSTEMS:  Positive for bronchitis, high blood pressure, diabetes.   FAMILY HISTORY:  Unremarkable.   SOCIAL HISTORY:  He smokes a pack a day.  He does not drink.   PHYSICAL EXAMINATION:  He is able to __________.  CHEST:  Lungs clear.  CARDIAC:  Heart sounds normal.  ABDOMEN:  Normal.  EXTREMITIES:  Normal grossly.  NEUROLOGIC:  Vital signs are normal.  Currently normal strength.  He has  weakness of the quadriceps and a burning sensation in the left leg.   CLINICAL IMPRESSION:  Left L3-4 herniated disk, extraforaminal.   RECOMMENDATIONS:  The patient is being admitted for surgery.  He knows about  the risks, such as no improvement whatsoever, need for further surgery which  might require fusion, CSF leak, infection.  The patient declined more  opinion.                                                Hilda Lias, M.D.    EB/MEDQ  D:  02/16/2004  T:  02/16/2004  Job:  [id number]

## 2010-12-22 NOTE — Assessment & Plan Note (Signed)
Mr. Brunsman is back in clinic today.  He is a 50 year old gentleman who is  status post C3-4 cervical fusion by Dr. Jeral Fruit which was done in March of  2003.  Prior to that, he had had some lumbar spine surgery in December of  2003 as well.   PROBLEM LIST:  1. Klippel-Feil at C2-3.  2. Status post cervical fusion at C3-4.  3. Chronic neck pain.   MEDICATIONS:  1. The medications being filled at this clinic include Norco 7.5/325.  2. Other medications being taken care of by  his primary care physician     include Actos, Benicar, and Aciphex.   HISTORY OF PRESENT ILLNESS:  [name] is a 50 year old gentleman who  continues to complain of neck pain especially left-sided neck pain as well  as pain into the left shoulder.  He reports his average daily pain to be  about an eight on a scale of 10.  He  denies any problems with bowel or  bladder control and he denies any new numbness, tingling or weakness.  He  does report he has difficulty doing buttons with his left hand.  He spends  most of his day sitting or lying down.  He is able to walk his dogs twice a  day.  He is independent with all of his activities of daily living with the  exception that he does need help occasionally with buttons.  He denies any  suicidal ideation or desire to harm others.   ALLERGIES:  He denies any new drug allergies.   PHYSICAL EXAMINATION:  He again sits with his head laterally flexed to the  right.  His blood pressure is 157/96, pulse 68, he is 95% saturation on room  air.  He is able to walk in the room without difficulty.  Tandem gait is  normal.  Heel-toe walking is within normal limits. Seated, reflexes are 2+  throughout with the exception of the right patellar tendon is 3+.  Toes are  downgoing.  No clonus is noted.  Sensation is decreased in the left arm from  C5 to C8 dermatome.  He has decreased motor strength, 4+/5 in the left  external rotators, wrist extensors and biceps.  Otherwise  strength  throughout is in the 5/5 range.  He has some incoordination with fingers on  the left hand.   Sensory examination reveals he had decreased sensation from C5 through C8 on  the left upper extremity.   IMPRESSION:  1. Status post cervical fusion at C3-4.  2. 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  3. 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   PLAN:  Will refill his Norco today.  Also discussed possibility of him  following up with Dr. Stevphen Rochester for cervical injection.  Would consider  initially left C5 root injection.  This gentleman does have strong  possibility of having multiple pain generators.   Also note, this gentleman is diabetic.  If he does have a procedure  involving cortisone, would need to follow up on his blood sugars closely  afterward.      Brantley Stage, M.D.   DMK/MedQ  D:  08/18/2003 13:07:29  T:  08/18/2003 14:02:48  Job #:  [id number]   cc:   Ramon Dredge L. Juanetta Gosling, M.D.  [address]  [place]  [place] [address]  Fax: [phone]   Hilda Lias, M.D.  [address]  [place], [place] [address]  Fax: [phone]

## 2010-12-22 NOTE — Op Note (Signed)
NAMEBOWDEN, BOODY                           ACCOUNT NO.:  [id number]   MEDICAL RECORD NO.:  [id number]                   PATIENT TYPE:  INP   LOCATION:  2899                                 FACILITY:  [facility]   PHYSICIAN:  Hilda Lias, M.D.                DATE OF BIRTH:  05-Oct-1960   DATE OF PROCEDURE:  02/16/2004  DATE OF DISCHARGE:                                 OPERATIVE REPORT   PREOPERATIVE DIAGNOSIS:  Recurrent left L3-4 extraforaminal herniated disk.   POSTOPERATIVE DIAGNOSIS:  Recurrent left L3-4 extraforaminal herniated disk.   OPERATION PERFORMED:  Left L3-L4 extraforaminal diskectomy, decompression of  the L3 nerve root.  Diskectomy, lysis of adhesions.  Microscope.   SURGEON:  Hilda Lias, M.D.   ASSISTANT:  Clydene Fake, M.D.   ANESTHESIA:  General.   INDICATIONS FOR PROCEDURE:  The patient was admitted because of back and  left leg pain.  In the past, he underwent surgery at the level 3-4 on the  left side.  Now he has a recurrent disk with pain going to the left leg.  Surgery was advised.   DESCRIPTION OF PROCEDURE:  The patient was taken to the operating room.  He  was positioned in a prone manner.  A half-moon incision in the skin  involving the previous scar was made.  Muscle was retracted medial and  laterally.  We did an x-ray that showed that indeed we were at the level of  3-4.  The patient had quite a bit of fibrosis.  Lysis was accomplished.  At  the beginning, the L3 nerve root was completely attached to lateral wall of  the spine.  Lysis was achieved.  We were able to mobilize the nerve root.  Indeed, there were two components.  One was a large osteophyte which was  drilled away.  Second, scar tissue which lysis was accomplished and disk  material.  We entered the disk space and diskectomy was accomplished.  At  the end the nerve root was mobile.  Valsalva maneuver was negative.  Fentanyl and Depo-Medrol were left and the wound was closed  with Vicryl and  Steri-Strips.                                               Hilda Lias, M.D.    EB/MEDQ  D:  02/16/2004  T:  02/16/2004  Job:  [id number]

## 2010-12-22 NOTE — Op Note (Signed)
   [name], [name]                           ACCOUNT NO.:  [id number]   MEDICAL RECORD NO.:  [id number]                   PATIENT TYPE:  INP   LOCATION:  3172                                 FACILITY:  [facility]   PHYSICIAN:  Hilda Lias, M.D.                DATE OF BIRTH:  1960-11-22   DATE OF PROCEDURE:  07/28/2002  DATE OF DISCHARGE:                                 OPERATIVE REPORT   PREOPERATIVE DIAGNOSIS:  Left L3-4 extra foraminal herniated disc with L3  radiculopathy.   POSTOPERATIVE DIAGNOSIS:  Left L3-4 extra foraminal herniated disc with L3  radiculopathy.   OPERATION:  Left L3-4 extra foraminal diskectomy using the C-arm, Medix and  the microscope.   SURGEON:  Hilda Lias, M.D.   CLINICAL HISTORY:  The patient has been admitted because of back and left  leg pain which has failed conservative treatment.  MRI shows a disc at L3-4  extra foraminal compromising the L3 nerve root.  The patient wants to go  ahead with surgery.   DESCRIPTION OF PROCEDURE:  The patient was taken to the operating room.  The  patient was positioned in a prone manner.  The back was prepped with  Betadine.  With the C-arm we were able to localize in an AP view the area of  3-4 on the left side.  This was confirmed using the lateral view.  Then  infiltration with Xylocaine was made and incision was made through the skin  and subcutaneous tissue and muscle plane.  We identified the transverse  process of 3-4 as well as the facet.  We drilled the upper lateral aspect of  the facet after removing the transverse ligament.  We found that the L3  nerve root was swollen with quite a bit of scar tissue and almost glued to  the floor.  Lysis of adhesions were made in order to retract the L3 nerve  root.  Indeed there was a right herniated disc with two components, one  calcified and the other was soft.  Using the drill we removed the soft part  and then with the osteophyte remover we removed the  calcified part.  We  entered the disc space and degenerative disc were removed.  At the end, we  had good decompression of the L3 nerve root.  Having done this, the Valsalva  maneuver was negative.  The area was irrigated.  Fentanyl and Depo-Medrol  epidural was placed and the wound was closed with Vicryl and Dermabond.                                               Hilda Lias, M.D.    EB/MEDQ  D:  07/28/2002  T:  07/28/2002  Job:  [id number]

## 2010-12-22 NOTE — Procedures (Signed)
NAME:  SABINO, DENNING                           ACCOUNT NO.:  [id number]   MEDICAL RECORD NO.:  [id number]                   PATIENT TYPE:  REC   LOCATION:  [facility]                                  FACILITY:  [facility]   PHYSICIAN:  Celene Kras, MD                     DATE OF BIRTH:  Dec 27, 1960   DATE OF PROCEDURE:  08/31/2003  DATE OF DISCHARGE:                                 OPERATIVE REPORT   HISTORY OF PRESENT ILLNESS:  Yitzhak [facility]wan [facility].  I evaluated him with the health and history form, and a  14 point review of systems.  I reviewed the available imaging, reviewed the  record and reviewed progress to date.   1. Granvel comes to [place] with decline in functional indices and quality of life     indices secondary to cervicalgia.  He has had a cervical fusion, when     describing a mechanical pain, superior and inferior to his infusion     sites.  I believe it is C5-6, C6-7.  He has been evaluated by     Neurosurgery and felt to be nonsurgical.  Conservative management is     requested.  2. He has also had surgery to his lumbar spine.  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  He relates that most     activities of daily living are impaired secondary to his pain.  3. I will go ahead and proceed with cervical epidural.  Consider     transforaminal injection and follow up.  He lateralizes to the left and     his C5-6, C6-7 distribution.  The MRI reveals multilevel disease.  He     does have a facetal overlay.  Provocative antagonism of cervical spine     exacerbates the cervical components, particularly lateralizing to the     left.  Grip strength is intact, and I do not see any obvious neurological     deficits, and I do think that translaminar injection is warranted to     begin both from a safety risk reward benefit and as a broad brush stroke     as he has multilevel disease.  4. He is a cigarette smoker, and I cautioned.   He will see Primary Care for     lifestyle enhancements.  He is also obese and fairly sedentary, and     recommend lifestyle enhancements for best outcome.   OBJECTIVE:  Diffuse paracervical, suprascapular and paralumbar myofascial  discomfort, pain over PSIS, notable pain on extension, well-healed incision.  His pain in the cervical spine extends to the suprascapula, levator scapular  region with positive cervical facetal compression tests, right greater than  left.  Grip strength is fine.  He is intact neurologically to motor and  sensory and  reflexes.   IMPRESSION:  Degenerative spine disease, cervical spine.  Degenerative spine  disease, lumbar spine.  Poor overall health characteristics.   PLAN:  Cervical epidural.  He has consented.   Patient taken to the fluoroscopy suite and placed in the upright position.  Neck prepped and draped in the usual fashion using a __________ needle  advanced into the C7-T1 inner space without any evidence of CSF,  hemiparesthesia, test block __________.  Followed with 20 mg of Aristocort  and flushed needle.   Tolerated procedure well.  No complications throughout procedure.  Reduce  dose of steroid as we will monitor his blood sugar, and he is cautioned as  potential escalation.  He does not reveal any signs of neuropathy, but  certainly peripheral neuropathy will be in our differential diagnosis, and  he is instructed to maintain contact with primary care.   DISCHARGE INSTRUCTIONS:  Discharge instructions given.  Consider facetal  injection if positive for provocative block.  Consideration for  radiofrequency neural ablation to decrease the frequency of interventional  procedures.  This might also help with the multilevel disease and  lateralizing symptoms.  Described in laymen's terms.  Discharge instructions  given.                                                Celene Kras, MD    HH/MEDQ  D:  08/31/2003 11:13:11  T:  08/31/2003  11:57:28  Job:  [id number]

## 2010-12-22 NOTE — H&P (Signed)
[place]  Patient:    [name], [name] Visit Number: [id number] MRN: [id number]          Service Type: SUR Location: 3000 [id number] Attending Physician:  Danella Penton Dictated by:   Tanya Nones. Jeral Fruit, M.D. Admit Date:  10/24/2001                           History and Physical  HISTORY OF PRESENT ILLNESS:  The patient was seen as emergency two days in my office because of weakness in the left arm up to the point that he is dropping things.  He also has achings in both legs, difficulty looking up and all the symptoms are progressively getting worse.  He denies suffering any problem with bladder or bowel.  He has difficulty with equilibrium.  He had an MRI and he was sent to [place] as an emergency.  ALLERGIES:  He is allergic to NONSTEROIDAL ANTI-INFLAMMATORY.  SOCIAL HISTORY:  He smokes less than a pack a day and he had been doing that since he was 50 years old.  Patient does not drink.  He is 5 feet 11 inches. He is 207 pounds.  REVIEW OF SYSTEMS:  Positive for bronchitis, high blood pressure, diabetes, neck pain, arm pain, leg pain and he has some infection going on in the groin which is taken care of by his physician.  PHYSICAL EXAMINATION:  HEENT:  Normal.  NECK:  He is able to flex but extension and lateralization produce some pain. There are no bruits in the carotids.  LUNGS:  There are some rhonchi bilaterally.  HEART:  Heart sounds normal.  ABDOMEN:  Normal.  EXTREMITIES:  Normal pulses.  SKIN:  There is some infection in the right groin.  NEUROLOGIC:  Patient is oriented x3.  Cranial nerves are normal.  Strength in the upper extremities:  I can break easily both deltoids, both biceps, both wrist extensors and the right triceps but not the left one.  He has weakness of the thenar muscles bilaterally.  In the lower extremities, I cannot find any weakness.  He has reflexes that are 2+ with a Babinski in the right  side. Coordination in the upper extremities is normal.  Sensation was grossly normal.  LABORATORY AND ACCESSORY DATA:  MRI showed that he has a large herniated disk at the level of 3-4 with already changes in the spinal cord itself.  He also has a minor spondylosis below.  CLINICAL IMPRESSION: 1. 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 2. Rule out carpal tunnel syndrome. 3. Diabetes. 4. Hypertension. 5. Chronic bronchitis.  RECOMMENDATIONS:  The patient is being admitted for anterior cervical diskectomy.  He knows about the risks such as paralysis, infection, damage to the vocal cords, damage to the esophagus, need for further surgery, damage to the trachea and stroke. Dictated by:   Tanya Nones. Jeral Fruit, M.D. Attending Physician:  Danella Penton DD:  10/24/01 TD:  10/25/01 Job: [phone] [id number]

## 2011-01-24 ENCOUNTER — Other Ambulatory Visit (HOSPITAL_COMMUNITY): Payer: Self-pay | Admitting: Pulmonary Disease

## 2011-01-24 ENCOUNTER — Ambulatory Visit (HOSPITAL_COMMUNITY)
Admission: RE | Admit: 2011-01-24 | Discharge: 2011-01-24 | Disposition: A | Discharge status: Home / Self Care | Payer: Medicare Other | Source: Ambulatory Visit | Attending: Pulmonary Disease | Admitting: Pulmonary Disease

## 2011-01-24 DIAGNOSIS — M25551 Pain in right hip: Secondary | ICD-10-CM

## 2011-01-24 DIAGNOSIS — M25559 Pain in unspecified hip: Secondary | ICD-10-CM | POA: Insufficient documentation

## 2011-02-06 ENCOUNTER — Emergency Department (HOSPITAL_COMMUNITY)
Admission: EM | Admit: 2011-02-06 | Discharge: 2011-02-06 | Discharge status: Against Medical Advice | Payer: Medicare Other | Attending: Emergency Medicine | Admitting: Emergency Medicine

## 2011-02-06 DIAGNOSIS — L02219 Cutaneous abscess of trunk, unspecified: Secondary | ICD-10-CM | POA: Insufficient documentation

## 2011-02-06 DIAGNOSIS — E119 Type 2 diabetes mellitus without complications: Secondary | ICD-10-CM | POA: Insufficient documentation

## 2011-02-06 DIAGNOSIS — E7800 Pure hypercholesterolemia, unspecified: Secondary | ICD-10-CM | POA: Insufficient documentation

## 2011-02-06 LAB — BASIC METABOLIC PANEL
BUN: 11 mg/dL (ref 6–23)
Calcium: 9.4 mg/dL (ref 8.4–10.5)
Creatinine, Ser: 0.82 mg/dL (ref 0.50–1.35)
GFR calc Af Amer: >60 mL/min (ref >60)
GFR calc non Af Amer: >60 mL/min (ref >60)
Glucose, Bld: 331 mg/dL — ABNORMAL HIGH (ref 70–99)
Potassium: 3.9 mEq/L (ref 3.5–5.1)

## 2011-02-06 LAB — CBC
HCT: 44.9 % (ref 39.0–52.0)
Hemoglobin: 15.6 g/dL (ref 13.0–17.0)
MCH: 31.5 pg (ref 26.0–34.0)
MCHC: 34.7 g/dL (ref 30.0–36.0)
MCV: 90.7 fL (ref 78.0–100.0)
RDW: 12.3 % (ref 11.5–15.5)

## 2011-02-06 LAB — DIFFERENTIAL
Basophils Absolute: 0.1 10*3/uL (ref 0.0–0.1)
Eosinophils Relative: 2 % (ref 0–5)
Lymphocytes Relative: 26 % (ref 12–46)
Monocytes Absolute: 0.9 10*3/uL (ref 0.1–1.0)
Monocytes Relative: 9 % (ref 3–12)

## 2011-06-05 ENCOUNTER — Other Ambulatory Visit (HOSPITAL_COMMUNITY): Payer: Self-pay | Admitting: Pulmonary Disease

## 2011-06-05 ENCOUNTER — Ambulatory Visit (HOSPITAL_COMMUNITY)
Admission: RE | Admit: 2011-06-05 | Discharge: 2011-06-05 | Disposition: A | Discharge status: Home / Self Care | Payer: Medicare Other | Source: Ambulatory Visit | Attending: Pulmonary Disease | Admitting: Pulmonary Disease

## 2011-06-05 DIAGNOSIS — Z981 Arthrodesis status: Secondary | ICD-10-CM | POA: Insufficient documentation

## 2011-06-05 DIAGNOSIS — M545 Low back pain, unspecified: Secondary | ICD-10-CM | POA: Insufficient documentation

## 2011-06-12 ENCOUNTER — Telehealth: Payer: Self-pay

## 2011-06-12 NOTE — Telephone Encounter (Signed)
LMOM for pt to call. 

## 2011-06-18 ENCOUNTER — Other Ambulatory Visit: Payer: Self-pay

## 2011-06-18 DIAGNOSIS — Z139 Encounter for screening, unspecified: Secondary | ICD-10-CM

## 2011-06-18 NOTE — Telephone Encounter (Signed)
Gastroenterology Pre-Procedure Form   Request Date: 06/18/2011        Requesting Physician: Dr. Juanetta Gosling     PATIENT INFORMATION:  [name] is a 50 y.o., male (DOB=[birth year]).  PROCEDURE: Procedure(s) requested: colonoscopy Procedure Reason: screening for colon cancer  PATIENT REVIEW QUESTIONS: The patient reports the following:   1. Diabetes Melitis: yes  2. Joint replacements in the past 12 months: no 3. Major health problems in the past 3 months: no 4. Has an artificial valve or MVP:no 5. Has been advised in past to take antibiotics in advance of a procedure like teeth cleaning: no}    MEDICATIONS & ALLERGIES:    Patient reports the following regarding taking any blood thinners:   Plavix? no Aspirin?no Coumadin?  no  Patient confirms/reports the following medications:  Current Outpatient Prescriptions  Medication Sig Dispense Refill  . glyBURIDE (DIABETA) 5 MG tablet Take 5 mg by mouth 3 (three) times daily.        . Liraglutide (VICTOZA [place]) Inject into the skin. 1.8 units at bedtime       . pioglitazone (ACTOS) 45 MG tablet Take 45 mg by mouth daily. At bedtime       . rosuvastatin (CRESTOR) 20 MG tablet Take 20 mg by mouth daily.          Patient confirms/reports the following allergies:  No Known Allergies  Patient is appropriate to schedule for requested procedure(s): yes  AUTHORIZATION INFORMATION Primary Insurance: ID #:   Group #:  Pre-Cert / Auth required: Pre-Cert / Auth #:   Secondary Insurance: ID #:   Group #:  Pre-Cert / Auth required: Pre-Cert / Auth #:  No orders of the defined types were placed in this encounter.    SCHEDULE INFORMATION: Procedure has been scheduled as follows:  Date: 07/18/2011       Time: 8:30 AM  Location: [facility] Short Stay  This Gastroenterology Pre-Precedure Form is being routed to the following provider(s) for review: R. Roetta Sessions, MD

## 2011-06-18 NOTE — Telephone Encounter (Signed)
Rx and instructions mailed to pt.  

## 2011-06-18 NOTE — Telephone Encounter (Signed)
OK for colonoscopy. Take half of your diabetes medications the day prior to the colonoscopy (date of prep) Early morning appointment-hold diabetes medications day of procedure Bring all your medications and/or any insulin to the hospital the day of the procedure. Follow blood sugars, call us or your PCP if any problems.

## 2011-07-11 ENCOUNTER — Encounter (HOSPITAL_COMMUNITY): Payer: Self-pay | Admitting: Pharmacy Technician

## 2011-07-17 MED ORDER — SODIUM CHLORIDE 0.45 % IV SOLN: Freq: Once | INTRAVENOUS | Status: DC

## 2011-07-18 ENCOUNTER — Encounter (HOSPITAL_COMMUNITY): Admission: RE | Payer: Self-pay | Source: Ambulatory Visit

## 2011-07-18 ENCOUNTER — Telehealth: Payer: Self-pay | Admitting: Gastroenterology

## 2011-07-18 ENCOUNTER — Ambulatory Visit (HOSPITAL_COMMUNITY): Admission: RE | Admit: 2011-07-18 | Payer: Medicare Other | Source: Ambulatory Visit | Admitting: Internal Medicine

## 2011-07-18 SURGERY — COLONOSCOPY
Anesthesia: Moderate Sedation

## 2011-07-18 MED ORDER — MEPERIDINE HCL 100 MG/ML IJ SOLN
INTRAMUSCULAR | Status: AC
  Filled 2011-07-18: qty 2

## 2011-07-18 MED ORDER — MIDAZOLAM HCL 5 MG/5ML IJ SOLN
INTRAMUSCULAR | Status: AC
  Filled 2011-07-18: qty 10

## 2011-07-18 NOTE — Telephone Encounter (Signed)
Per Shawna Orleans in short stay- pt was a no show for procedure- they attempted to contact pt with no success-

## 2011-08-01 ENCOUNTER — Telehealth: Payer: Self-pay

## 2011-08-01 NOTE — Telephone Encounter (Signed)
Staff message from [name], per [place] in [place] Stay, pt no showed for procedure on 07/18/2011. Mailing a letter to pt to call and reschedule. Will need to be re-triaged.

## 2011-08-17 ENCOUNTER — Encounter: Payer: Self-pay | Admitting: Gastroenterology

## 2011-08-17 ENCOUNTER — Ambulatory Visit (INDEPENDENT_AMBULATORY_CARE_PROVIDER_SITE_OTHER): Payer: Medicare Other | Admitting: Internal Medicine

## 2011-08-17 ENCOUNTER — Encounter: Payer: Self-pay | Admitting: Internal Medicine

## 2011-08-17 DIAGNOSIS — R112 Nausea with vomiting, unspecified: Secondary | ICD-10-CM | POA: Insufficient documentation

## 2011-08-17 NOTE — Progress Notes (Signed)
Primary Care Physician:  Fredirick Maudlin, MD, MD Primary Gastroenterologist:  Dr. Jena Gauss  Pre-Procedure History & Physical: HPI:  [name] is a 51 y.o. male here for evaluation of postprandial nausea and vomiting. Patient was originally slated to have a colonoscopy in December of last year but started throwing up the prep / could not take it.   Has lost 11 pounds in the past 3 months per his report. No trouble with liquids however, when he takes in a meal oftentimes it will go easily but it will backup and he will vomit within 5 minutes after finishing a meal. No associated abdominal pain does really have any reflux symptoms and no dysphagia or odynophagia. Long-standing diabetes history. Sporadic control of blood sugars. He tells me in the recent past blood sugars range from 50-500. Cannot get refracted at the eye doctor because of markedly fluctuating blood sugars. He tells me his hemoglobin A1c was "higher than 7 recently". He scheduled to see the endocrinologist in the near future for diabetes management..  No prior colonoscopy. Family history positive for lung cancer. Underwent EGD for reflux like symptoms back in 2002; he was found to have a hiatal hernia and what appeared to be pill-induced distal esophageal injury.  Past Medical History  Diagnosis Date  . Esophageal erosions 05/24/01    egd by Dr. Jena Gauss  . Diabetes mellitus     type 2  . Hypercholesteremia   . Back pain   . COPD (chronic obstructive pulmonary disease)   . Hyperlipidemia     Past Surgical History  Procedure Date  . Esophagogastroduodenoscopy 05/24/01    by Dr. Jena Gauss  . Back surgery   . Shoulder surgery     left  . Pelvic abcess drainage     Prior to Admission medications   Medication Sig Start Date End Date Taking? Authorizing Provider  glyBURIDE (DIABETA) 5 MG tablet Take 5 mg by mouth 3 (three) times daily. Pt takes one and a half tablets daily   Yes Historical Provider, MD  Liraglutide (VICTOZA [place])  Inject into the skin. 1.8 units at bedtime    Yes Historical Provider, MD  pioglitazone (ACTOS) 45 MG tablet Take 45 mg by mouth at bedtime.    Yes Historical Provider, MD  promethazine (PHENERGAN) 25 MG tablet Take 25 mg by mouth every 6 (six) hours as needed.   Yes Historical Provider, MD  rosuvastatin (CRESTOR) 20 MG tablet Take 20 mg by mouth daily.     Yes Historical Provider, MD    Allergies as of 08/17/2011  . (No Known Allergies)    No family history on file.  History   Social History  . Marital Status: Single    Spouse Name: N/A    Number of Children: N/A  . Years of Education: N/A   Occupational History  . Not on file.   Social History Main Topics  . Smoking status: Current Everyday Smoker  . Smokeless tobacco: Not on file   Comment: Smokes one and a half packs of ciggarettes daily  . Alcohol Use: Not on file  . Drug Use: Not on file  . Sexually Active: Not on file   Other Topics Concern  . Not on file   Social History Narrative  . No narrative on file    Review of Systems: See HPI, otherwise negative ROS  Physical Exam: BP 158/88  Pulse 64  Temp(Src) 96.2 F (35.7 C) (Tympanic)  Ht 5\' 11"  (1.803 m)  Wt 196  lb 9.6 oz (89.177 kg)  BMI 27.42 kg/m2 General:   Alert,  Well-developed, well-nourished, pleasant and cooperative in NAD. accompanied by his wife Skin:  Intact without significant lesions or rashes. Eyes:  Sclera clear, no icterus.   Conjunctiva pink. Ears:  Normal auditory acuity. Nose:  No deformity, discharge,  or lesions. Mouth:  No deformity or lesions. Neck:  Supple; no masses or thyromegaly. No significant cervical adenopathy. Lungs:  Clear throughout to auscultation.   No wheezes, crackles, or rhonchi. No acute distress. Heart:  Regular rate and rhythm; no murmurs, clicks, rubs,  or gallops. Abdomen: Non-distended, normal bowel sounds. No succussion splash Soft and nontender without appreciable mass or hepatosplenomegaly.  Pulses:   Normal pulses noted. Extremities:  Without clubbing or edema.  Impression/Plan:

## 2011-08-17 NOTE — Patient Instructions (Addendum)
EGD and colonoscopy in the near future at the hospital  Keep appointment with diabetic specialist.  Further recommendations to follow after we get your endoscopies done  Will change your colonoscopy prep and give you some medicine called Reglan to help keep prep down

## 2011-08-17 NOTE — Assessment & Plan Note (Addendum)
Pleasant 51 year old gentleman with postprandial nausea and vomiting of a couple of months duration. Associated with 11 pound weight loss. This is in a setting of poorly controlled diabetes. He has no associated abdominal pain.  I suspect gastroparesis. Doubt mechanical obstruction. He does need further evaluation, however.  I doubt occult gallbladder pathology.  He also needs another attempt at first ever average risk colon cancer screening as well.  Recommendations: Plan for diagnostic EGD and screening colonoscopy in the near future.The risks, benefits, limitations, alternatives and imponderables have been reviewed with the patient. Questions have been answered. All parties are agreeable.

## 2011-08-27 MED ORDER — SODIUM CHLORIDE 0.45 % IV SOLN
Freq: Once | INTRAVENOUS | Status: AC
  Administered 2011-08-28: 10:00:00 via INTRAVENOUS

## 2011-08-28 ENCOUNTER — Ambulatory Visit (HOSPITAL_COMMUNITY)
Admission: RE | Admit: 2011-08-28 | Discharge: 2011-08-28 | Disposition: A | Discharge status: Home / Self Care | Payer: Medicare Other | Source: Ambulatory Visit | Attending: Internal Medicine | Admitting: Internal Medicine

## 2011-08-28 ENCOUNTER — Other Ambulatory Visit: Payer: Self-pay | Admitting: Internal Medicine

## 2011-08-28 ENCOUNTER — Encounter (HOSPITAL_COMMUNITY)
Admission: RE | Disposition: A | Discharge status: Home / Self Care | Payer: Self-pay | Source: Ambulatory Visit | Attending: Internal Medicine

## 2011-08-28 ENCOUNTER — Encounter (HOSPITAL_COMMUNITY): Payer: Self-pay

## 2011-08-28 DIAGNOSIS — K620 Anal polyp: Secondary | ICD-10-CM

## 2011-08-28 DIAGNOSIS — E7800 Pure hypercholesterolemia, unspecified: Secondary | ICD-10-CM | POA: Insufficient documentation

## 2011-08-28 DIAGNOSIS — D126 Benign neoplasm of colon, unspecified: Secondary | ICD-10-CM

## 2011-08-28 DIAGNOSIS — D129 Benign neoplasm of anus and anal canal: Secondary | ICD-10-CM | POA: Insufficient documentation

## 2011-08-28 DIAGNOSIS — R112 Nausea with vomiting, unspecified: Secondary | ICD-10-CM | POA: Insufficient documentation

## 2011-08-28 DIAGNOSIS — D128 Benign neoplasm of rectum: Secondary | ICD-10-CM | POA: Insufficient documentation

## 2011-08-28 DIAGNOSIS — K2100 Gastro-esophageal reflux disease with esophagitis, without bleeding: Secondary | ICD-10-CM | POA: Insufficient documentation

## 2011-08-28 DIAGNOSIS — K621 Rectal polyp: Secondary | ICD-10-CM

## 2011-08-28 DIAGNOSIS — Z1211 Encounter for screening for malignant neoplasm of colon: Secondary | ICD-10-CM | POA: Insufficient documentation

## 2011-08-28 DIAGNOSIS — J4489 Other specified chronic obstructive pulmonary disease: Secondary | ICD-10-CM | POA: Insufficient documentation

## 2011-08-28 DIAGNOSIS — J449 Chronic obstructive pulmonary disease, unspecified: Secondary | ICD-10-CM | POA: Insufficient documentation

## 2011-08-28 DIAGNOSIS — E119 Type 2 diabetes mellitus without complications: Secondary | ICD-10-CM | POA: Insufficient documentation

## 2011-08-28 HISTORY — DX: Benign neoplasm of colon, unspecified: D12.6

## 2011-08-28 HISTORY — PX: ESOPHAGOGASTRODUODENOSCOPY: SHX1529

## 2011-08-28 HISTORY — PX: COLONOSCOPY W/ POLYPECTOMY: SHX1380

## 2011-08-28 SURGERY — COLONOSCOPY WITH ESOPHAGOGASTRODUODENOSCOPY (EGD)
Anesthesia: Moderate Sedation

## 2011-08-28 MED ORDER — BUTAMBEN-TETRACAINE-BENZOCAINE 2-2-14 % EX AERO
INHALATION_SPRAY | CUTANEOUS | Status: DC | PRN
  Administered 2011-08-28: 2 via TOPICAL

## 2011-08-28 MED ORDER — MEPERIDINE HCL 100 MG/ML IJ SOLN
INTRAMUSCULAR | Status: AC
  Filled 2011-08-28: qty 2

## 2011-08-28 MED ORDER — MIDAZOLAM HCL 5 MG/5ML IJ SOLN
INTRAMUSCULAR | Status: AC
  Filled 2011-08-28: qty 10

## 2011-08-28 MED ORDER — MIDAZOLAM HCL 5 MG/5ML IJ SOLN
INTRAMUSCULAR | Status: DC | PRN
  Administered 2011-08-28: 1 mg via INTRAVENOUS
  Administered 2011-08-28: 2 mg via INTRAVENOUS
  Administered 2011-08-28: 1 mg via INTRAVENOUS

## 2011-08-28 MED ORDER — MEPERIDINE HCL 100 MG/ML IJ SOLN
INTRAMUSCULAR | Status: DC | PRN
  Administered 2011-08-28: 25 mg via INTRAVENOUS
  Administered 2011-08-28: 50 mg via INTRAVENOUS
  Administered 2011-08-28: 25 mg via INTRAVENOUS

## 2011-08-28 NOTE — Op Note (Signed)
[facility] [address] [place], [place]  [address]  COLONOSCOPY PROCEDURE REPORT  PATIENT:  [name], [name]  MR#:  [id number] BIRTHDATE:  [birth year], 50 yrs. old  GENDER:  male ENDOSCOPIST:  R. Roetta Sessions, MD FACP [facility] REF. BY:  Kari Baars, M.D. PROCEDURE DATE:  08/28/2011 PROCEDURE:  colonoscopy with biopsy, polyp ablation and snare polypectomy  INDICATIONS:  first ever average risk screening colonoscopy  INFORMED CONSENT:  The risks, benefits, alternatives and imponderables including but not limited to bleeding, perforation as well as the possibility of a missed lesion have been reviewed. The potential for biopsy, lesion removal, etc. have also been discussed.  Questions have been answered.  All parties agreeable. Please see the history and physical in the medical record for more information.  MEDICATIONS:  Versed 4 mg IV and Demerol 100 mg IV in divided doses.  DESCRIPTION OF PROCEDURE:  After a digital rectal exam was performed, the EC-[id number] ([id number]) colonoscope was advanced from the anus through the rectum and colon to the area of the cecum, ileocecal valve and appendiceal orifice.  The cecum was deeply intubated.  These structures were well-seen and photographed for the record.  From the level of the cecum and ileocecal valve, the scope was slowly and cautiously withdrawn.  The mucosal surfaces were carefully surveyed utilizing scope tip deflection to facilitate fold flattening as needed.  The scope was pulled down into the rectum where a thorough examination including retroflexion was performed. <<PROCEDUREIMAGES>>  FINDINGS:   suboptimal prep but doable.  multiple diminutive rectal polyps. 2 Ascending colon polyps - one diminutive in nature and the other approximately 6 mm in dimensions(sessile). The remainder of the colonic mucosa appeared normal.  THERAPEUTIC / DIAGNOSTIC MANEUVERS PERFORMED:    the diminutive polyps in the rectum were ablated  with the hot snare. The ascending colon polyps were removed with cold biopsy and hot snare technique  COMPLICATIONS:  none  CECAL WITHDRAWAL TIME:10 minutes  IMPRESSION:  Rectal and colonic polyps treated as described above  RECOMMENDATIONS:  Followup on pathology  ______________________________ R. Roetta Sessions, MD Caleen Essex  CC:  Kari Baars, M.D.  n. eSIGNED:   R. Roetta Sessions at 08/28/2011 12:20 PM  Darlyn Chamber, [phone]

## 2011-08-28 NOTE — Op Note (Signed)
[facility] [address] [place], [place]  [address]  ENDOSCOPY PROCEDURE REPORT  PATIENT:  [name], [name]  MR#:  [id number] BIRTHDATE:  12-16-1960, 50 yrs. old  GENDER:  male  ENDOSCOPIST:  R. Roetta Sessions, MD Caleen Essex Referred by:  Kari Baars, M.D.  PROCEDURE DATE:  08/28/2011 PROCEDURE:  diagnostic EGD  INDICATIONS:  nausea and vomiting in the setting of poorly controlled blood glucose  INFORMED CONSENT:   The risks, benefits, limitations, alternatives and imponderables have been discussed.  The potential for biopsy, esophogeal dilation, etc. have also been reviewed.  Questions have been answered.  All parties agreeable.  Please see the history and physical in the medical record for more information.  MEDICATIONS:   Demerol 75 mg IV and Versed 3 mg IV in divided doses. Cetacaine spray.  DESCRIPTION OF PROCEDURE:   The EG-[id number] ([id number]) endoscope was introduced through the mouth and advanced to the second portion of the duodenum without difficulty or limitations.  The mucosal surfaces were surveyed very carefully during advancement of the scope and upon withdrawal.  Retroflexion view of the proximal stomach and esophagogastric junction was performed.  <<PROCEDUREIMAGES>>  FINDINGS:  distal esophageal erosions with mucosal breaks approximately 4  mm up into the distal esophagus. No Barrett's esophagus. No neoplasm. EG junction easily traversed. Minimal bile-stained gastric residue no gastric mucosal abnormalities except for couple tiny antral erosions; pylorusor easily traversed. Examination of first and  second portion of     the duodenum revealed no abnormalities.  THERAPEUTIC / DIAGNOSTIC MANEUVERS PERFORMED:  none  COMPLICATIONS:   None  IMPRESSION:   Erosive reflux esophagitis. Minimal gastric erosions.   I suspect varying degrees of abnormal gastric emptying from time to                   time in the setting of markedly fluctuating blood  sugars, exacerbating GERD.  RECOMMENDATIONS:  Begin PPI. Tighter control of blood sugars. See colonoscopy report.  ______________________________ R. Roetta Sessions, MD Caleen Essex  CC:  n. eSIGNED:   R. Casimiro Needle Rourk at 08/28/2011 11:54 AM  Darlyn Chamber, [phone]

## 2011-08-28 NOTE — H&P (Signed)
  I have seen & examined the patient prior to the procedure(s) today and reviewed the history and physical/consultation.  There have been no changes.  After consideration of the risks, benefits, alternatives and imponderables, the patient has consented to the procedure(s).   

## 2011-09-01 ENCOUNTER — Encounter: Payer: Self-pay | Admitting: Internal Medicine

## 2011-09-05 ENCOUNTER — Telehealth: Payer: Self-pay

## 2011-09-05 NOTE — Telephone Encounter (Signed)
Pt called with stomach cramps and nausea. He has no appetite. He is loses weight. Please advies

## 2011-09-06 NOTE — Telephone Encounter (Signed)
Call pt and made him an office appointment with AS on Monday at 10:30

## 2011-09-06 NOTE — Telephone Encounter (Signed)
Ok sorry I did not know he had his test done which were your recommendations per office note. Pt is still having N/V and cramps in stomach. Please advise what to do next

## 2011-09-06 NOTE — Telephone Encounter (Signed)
This is going to be best handled with an office visit. Please get him set up to see one of the extenders the first of next week.

## 2011-09-06 NOTE — Telephone Encounter (Signed)
See my last office note for my impression/ recommendations.

## 2011-09-06 NOTE — Telephone Encounter (Signed)
We will get him schedule him for is TCS and EGD. What prep do you want him to have?

## 2011-09-10 ENCOUNTER — Telehealth: Payer: Self-pay | Admitting: Gastroenterology

## 2011-09-10 ENCOUNTER — Ambulatory Visit: Payer: Medicare Other | Admitting: Gastroenterology

## 2011-09-10 NOTE — Telephone Encounter (Signed)
Pt was a no show

## 2011-10-24 ENCOUNTER — Other Ambulatory Visit (HOSPITAL_COMMUNITY): Payer: Self-pay | Admitting: Pulmonary Disease

## 2011-10-24 ENCOUNTER — Ambulatory Visit (HOSPITAL_COMMUNITY)
Admission: RE | Admit: 2011-10-24 | Discharge: 2011-10-24 | Disposition: A | Discharge status: Home / Self Care | Payer: Medicare Other | Source: Ambulatory Visit | Attending: Pulmonary Disease | Admitting: Pulmonary Disease

## 2011-10-24 DIAGNOSIS — J449 Chronic obstructive pulmonary disease, unspecified: Secondary | ICD-10-CM | POA: Insufficient documentation

## 2011-10-24 DIAGNOSIS — Z87891 Personal history of nicotine dependence: Secondary | ICD-10-CM | POA: Insufficient documentation

## 2011-10-24 DIAGNOSIS — R059 Cough, unspecified: Secondary | ICD-10-CM | POA: Insufficient documentation

## 2011-10-24 DIAGNOSIS — J4489 Other specified chronic obstructive pulmonary disease: Secondary | ICD-10-CM | POA: Insufficient documentation

## 2011-10-24 DIAGNOSIS — R05 Cough: Secondary | ICD-10-CM | POA: Insufficient documentation

## 2011-11-30 ENCOUNTER — Other Ambulatory Visit: Payer: Self-pay | Admitting: Neurosurgery

## 2011-11-30 DIAGNOSIS — M542 Cervicalgia: Secondary | ICD-10-CM

## 2011-11-30 DIAGNOSIS — M5410 Radiculopathy, site unspecified: Secondary | ICD-10-CM

## 2011-12-06 ENCOUNTER — Ambulatory Visit
Admission: RE | Admit: 2011-12-06 | Discharge: 2011-12-06 | Disposition: A | Discharge status: Home / Self Care | Payer: Medicare Other | Source: Ambulatory Visit | Attending: Neurosurgery | Admitting: Neurosurgery

## 2011-12-06 VITALS — BP 155/75 | HR 61

## 2011-12-06 DIAGNOSIS — M5410 Radiculopathy, site unspecified: Secondary | ICD-10-CM

## 2011-12-06 DIAGNOSIS — M542 Cervicalgia: Secondary | ICD-10-CM

## 2011-12-06 MED ORDER — DIAZEPAM 5 MG PO TABS
10.0000 mg | ORAL_TABLET | Freq: Once | ORAL | Status: AC
  Administered 2011-12-06: 10 mg via ORAL

## 2011-12-06 MED ORDER — IOHEXOL 300 MG/ML  SOLN
10.0000 mL | Freq: Once | INTRAMUSCULAR | Status: AC | PRN
  Administered 2011-12-06: 10 mL via INTRATHECAL

## 2011-12-06 NOTE — Discharge Instructions (Signed)

## 2011-12-06 NOTE — Telephone Encounter (Signed)
First no-show. Needs routine OV.

## 2011-12-06 NOTE — Progress Notes (Signed)
Procedure and discharge instructions explained to patient.  Informed consent obtained.  jkl

## 2011-12-12 ENCOUNTER — Encounter: Payer: Self-pay | Admitting: Internal Medicine

## 2011-12-12 NOTE — Telephone Encounter (Signed)
Mailed letter to patient to call office to set up OV °

## 2011-12-18 ENCOUNTER — Encounter (HOSPITAL_COMMUNITY): Payer: Self-pay | Admitting: Pharmacy Technician

## 2011-12-18 ENCOUNTER — Other Ambulatory Visit: Payer: Self-pay | Admitting: Neurosurgery

## 2011-12-18 ENCOUNTER — Encounter (HOSPITAL_COMMUNITY): Payer: Self-pay | Admitting: *Deleted

## 2011-12-18 NOTE — Progress Notes (Addendum)
I called and left a voice message with Shanda Bumps at Dr Cassandria Santee office requesting tha she ask Dr Jeral Fruit to sign orders.

## 2011-12-19 ENCOUNTER — Encounter (HOSPITAL_COMMUNITY)
Admission: RE | Disposition: A | Discharge status: Home / Self Care | Payer: Self-pay | Source: Ambulatory Visit | Attending: Neurosurgery

## 2011-12-19 ENCOUNTER — Encounter (HOSPITAL_COMMUNITY): Payer: Self-pay | Admitting: *Deleted

## 2011-12-19 ENCOUNTER — Encounter (HOSPITAL_COMMUNITY): Payer: Self-pay | Admitting: Critical Care Medicine

## 2011-12-19 ENCOUNTER — Inpatient Hospital Stay (HOSPITAL_COMMUNITY)
Admission: RE | Admit: 2011-12-19 | Discharge: 2011-12-20 | DRG: 473 | Disposition: A | Discharge status: Home / Self Care | Payer: Medicare Other | Source: Ambulatory Visit | Attending: Neurosurgery | Admitting: Neurosurgery

## 2011-12-19 ENCOUNTER — Ambulatory Visit (HOSPITAL_COMMUNITY): Payer: Medicare Other

## 2011-12-19 ENCOUNTER — Ambulatory Visit (HOSPITAL_COMMUNITY): Payer: Medicare Other | Admitting: Critical Care Medicine

## 2011-12-19 DIAGNOSIS — J4489 Other specified chronic obstructive pulmonary disease: Secondary | ICD-10-CM | POA: Diagnosis present

## 2011-12-19 DIAGNOSIS — M47812 Spondylosis without myelopathy or radiculopathy, cervical region: Principal | ICD-10-CM | POA: Diagnosis present

## 2011-12-19 DIAGNOSIS — F17200 Nicotine dependence, unspecified, uncomplicated: Secondary | ICD-10-CM | POA: Diagnosis present

## 2011-12-19 DIAGNOSIS — J449 Chronic obstructive pulmonary disease, unspecified: Secondary | ICD-10-CM | POA: Diagnosis present

## 2011-12-19 DIAGNOSIS — E119 Type 2 diabetes mellitus without complications: Secondary | ICD-10-CM | POA: Diagnosis present

## 2011-12-19 DIAGNOSIS — K219 Gastro-esophageal reflux disease without esophagitis: Secondary | ICD-10-CM | POA: Diagnosis present

## 2011-12-19 HISTORY — DX: Gastro-esophageal reflux disease without esophagitis: K21.9

## 2011-12-19 HISTORY — DX: Unspecified osteoarthritis, unspecified site: M19.90

## 2011-12-19 HISTORY — PX: ANTERIOR CERVICAL DECOMP/DISCECTOMY FUSION: SHX1161

## 2011-12-19 HISTORY — DX: Personal history of other diseases of the digestive system: Z87.19

## 2011-12-19 LAB — CBC
MCH: 32.7 pg (ref 26.0–34.0)
MCHC: 36.2 g/dL — ABNORMAL HIGH (ref 30.0–36.0)
Platelets: 153 10*3/uL (ref 150–400)
RDW: 12 % (ref 11.5–15.5)

## 2011-12-19 LAB — BASIC METABOLIC PANEL
BUN: 10 mg/dL (ref 6–23)
Calcium: 9.7 mg/dL (ref 8.4–10.5)
Creatinine, Ser: 0.7 mg/dL (ref 0.50–1.35)
GFR calc non Af Amer: >90 mL/min (ref >90)
Glucose, Bld: 241 mg/dL — ABNORMAL HIGH (ref 70–99)
Sodium: 136 mEq/L (ref 135–145)

## 2011-12-19 LAB — SURGICAL PCR SCREEN: MRSA, PCR: NEGATIVE

## 2011-12-19 LAB — GLUCOSE, CAPILLARY: Glucose-Capillary: 567 mg/dL (ref 70–99)

## 2011-12-19 SURGERY — ANTERIOR CERVICAL DECOMPRESSION/DISCECTOMY FUSION 2 LEVELS
Anesthesia: General | Site: Neck

## 2011-12-19 MED ORDER — LIDOCAINE HCL 4 % MT SOLN
OROMUCOSAL | Status: DC | PRN
  Administered 2011-12-19: 4 mL via TOPICAL

## 2011-12-19 MED ORDER — SODIUM CHLORIDE 0.9 % IJ SOLN: 3.0000 mL | INTRAMUSCULAR | Status: DC | PRN

## 2011-12-19 MED ORDER — PROPOFOL 10 MG/ML IV EMUL
INTRAVENOUS | Status: DC | PRN
  Administered 2011-12-19: 200 mg via INTRAVENOUS

## 2011-12-19 MED ORDER — SODIUM CHLORIDE 0.9 % IV SOLN: 250.0000 mL | INTRAVENOUS | Status: DC

## 2011-12-19 MED ORDER — DIAZEPAM 5 MG PO TABS
5.0000 mg | ORAL_TABLET | Freq: Four times a day (QID) | ORAL | Status: DC | PRN
  Administered 2011-12-19 – 2011-12-20 (×2): 5 mg via ORAL
  Filled 2011-12-19 (×2): qty 1

## 2011-12-19 MED ORDER — LACTATED RINGERS IV SOLN
INTRAVENOUS | Status: DC | PRN
  Administered 2011-12-19 (×2): via INTRAVENOUS

## 2011-12-19 MED ORDER — PHENYLEPHRINE HCL 10 MG/ML IJ SOLN
INTRAMUSCULAR | Status: DC | PRN
  Administered 2011-12-19 (×2): 40 ug via INTRAVENOUS
  Administered 2011-12-19: 80 ug via INTRAVENOUS

## 2011-12-19 MED ORDER — FENTANYL CITRATE 0.05 MG/ML IJ SOLN
INTRAMUSCULAR | Status: DC | PRN
  Administered 2011-12-19: 100 ug via INTRAVENOUS
  Administered 2011-12-19 (×4): 50 ug via INTRAVENOUS

## 2011-12-19 MED ORDER — GLYCOPYRROLATE 0.2 MG/ML IJ SOLN
INTRAMUSCULAR | Status: DC | PRN
  Administered 2011-12-19: 0.6 mg via INTRAVENOUS

## 2011-12-19 MED ORDER — LABETALOL HCL 5 MG/ML IV SOLN
INTRAVENOUS | Status: AC
  Administered 2011-12-19: 10 mg
  Filled 2011-12-19: qty 4

## 2011-12-19 MED ORDER — METHYLPREDNISOLONE SODIUM SUCC 125 MG IJ SOLR
INTRAMUSCULAR | Status: DC | PRN
  Administered 2011-12-19: 125 mg via INTRAVENOUS

## 2011-12-19 MED ORDER — HYDROMORPHONE HCL PF 1 MG/ML IJ SOLN: 0.2500 mg | INTRAMUSCULAR | Status: DC | PRN

## 2011-12-19 MED ORDER — NEOSTIGMINE METHYLSULFATE 1 MG/ML IJ SOLN
INTRAMUSCULAR | Status: DC | PRN
  Administered 2011-12-19: 4 mg via INTRAVENOUS

## 2011-12-19 MED ORDER — ACETAMINOPHEN 650 MG RE SUPP: 650.0000 mg | RECTAL | Status: DC | PRN

## 2011-12-19 MED ORDER — MENTHOL 3 MG MT LOZG: 1.0000 | LOZENGE | OROMUCOSAL | Status: DC | PRN

## 2011-12-19 MED ORDER — ONDANSETRON HCL 4 MG/2ML IJ SOLN
INTRAMUSCULAR | Status: DC | PRN
  Administered 2011-12-19: 4 mg via INTRAVENOUS

## 2011-12-19 MED ORDER — MUPIROCIN 2 % EX OINT
TOPICAL_OINTMENT | CUTANEOUS | Status: AC
  Administered 2011-12-19: 1
  Filled 2011-12-19: qty 22

## 2011-12-19 MED ORDER — METFORMIN HCL 500 MG PO TABS
1000.0000 mg | ORAL_TABLET | Freq: Two times a day (BID) | ORAL | Status: DC
  Administered 2011-12-19 – 2011-12-20 (×2): 1000 mg via ORAL
  Filled 2011-12-19 (×6): qty 2

## 2011-12-19 MED ORDER — OXYCODONE-ACETAMINOPHEN 5-325 MG PO TABS
1.0000 | ORAL_TABLET | ORAL | Status: DC | PRN
  Administered 2011-12-19 – 2011-12-20 (×2): 2 via ORAL
  Filled 2011-12-19 (×2): qty 2

## 2011-12-19 MED ORDER — CEFAZOLIN SODIUM 1-5 GM-% IV SOLN
INTRAVENOUS | Status: AC
  Administered 2011-12-19: 1 g via INTRAVENOUS
  Filled 2011-12-19: qty 50

## 2011-12-19 MED ORDER — PHENOL 1.4 % MT LIQD: 1.0000 | OROMUCOSAL | Status: DC | PRN

## 2011-12-19 MED ORDER — LABETALOL HCL 5 MG/ML IV SOLN
5.0000 mg | INTRAVENOUS | Status: AC | PRN
  Administered 2011-12-19 (×4): 5 mg via INTRAVENOUS

## 2011-12-19 MED ORDER — INSULIN ASPART 100 UNIT/ML ~~LOC~~ SOLN
0.0000 [IU] | SUBCUTANEOUS | Status: DC
  Administered 2011-12-20: 15 [IU] via SUBCUTANEOUS
  Administered 2011-12-20: 7 [IU] via SUBCUTANEOUS

## 2011-12-19 MED ORDER — MORPHINE SULFATE 4 MG/ML IJ SOLN
INTRAMUSCULAR | Status: AC
  Administered 2011-12-19: 4 mg
  Filled 2011-12-19: qty 1

## 2011-12-19 MED ORDER — 0.9 % SODIUM CHLORIDE (POUR BTL) OPTIME
TOPICAL | Status: DC | PRN
  Administered 2011-12-19: 1000 mL

## 2011-12-19 MED ORDER — CEFAZOLIN SODIUM 1-5 GM-% IV SOLN
1.0000 g | Freq: Three times a day (TID) | INTRAVENOUS | Status: AC
  Administered 2011-12-19 – 2011-12-20 (×2): 1 g via INTRAVENOUS
  Filled 2011-12-19 (×2): qty 50

## 2011-12-19 MED ORDER — ACETAMINOPHEN 325 MG PO TABS: 650.0000 mg | ORAL_TABLET | ORAL | Status: DC | PRN

## 2011-12-19 MED ORDER — LIDOCAINE HCL (CARDIAC) 20 MG/ML IV SOLN
INTRAVENOUS | Status: DC | PRN
  Administered 2011-12-19: 100 mg via INTRAVENOUS

## 2011-12-19 MED ORDER — THROMBIN 5000 UNITS EX KIT
PACK | CUTANEOUS | Status: DC | PRN
  Administered 2011-12-19 (×3): 5000 [IU] via TOPICAL

## 2011-12-19 MED ORDER — ARTIFICIAL TEARS OP OINT
TOPICAL_OINTMENT | OPHTHALMIC | Status: DC | PRN
  Administered 2011-12-19: 1 via OPHTHALMIC

## 2011-12-19 MED ORDER — VECURONIUM BROMIDE 10 MG IV SOLR
INTRAVENOUS | Status: DC | PRN
  Administered 2011-12-19: 1 mg via INTRAVENOUS

## 2011-12-19 MED ORDER — HEMOSTATIC AGENTS (NO CHARGE) OPTIME
TOPICAL | Status: DC | PRN
  Administered 2011-12-19: 1 via TOPICAL

## 2011-12-19 MED ORDER — IPRATROPIUM BROMIDE HFA 17 MCG/ACT IN AERS
INHALATION_SPRAY | RESPIRATORY_TRACT | Status: DC | PRN
  Administered 2011-12-19 (×2): 2 via RESPIRATORY_TRACT

## 2011-12-19 MED ORDER — MIDAZOLAM HCL 5 MG/5ML IJ SOLN
INTRAMUSCULAR | Status: DC | PRN
  Administered 2011-12-19: 2 mg via INTRAVENOUS

## 2011-12-19 MED ORDER — ONDANSETRON HCL 4 MG/2ML IJ SOLN: 4.0000 mg | INTRAMUSCULAR | Status: DC | PRN

## 2011-12-19 MED ORDER — SODIUM CHLORIDE 0.9 % IJ SOLN
3.0000 mL | Freq: Two times a day (BID) | INTRAMUSCULAR | Status: DC
  Administered 2011-12-19: 3 mL via INTRAVENOUS

## 2011-12-19 MED ORDER — LORAZEPAM 2 MG/ML IJ SOLN: 1.0000 mg | Freq: Once | INTRAMUSCULAR | Status: DC | PRN

## 2011-12-19 MED ORDER — ROCURONIUM BROMIDE 100 MG/10ML IV SOLN
INTRAVENOUS | Status: DC | PRN
  Administered 2011-12-19: 50 mg via INTRAVENOUS

## 2011-12-19 MED ORDER — INSULIN ASPART 100 UNIT/ML ~~LOC~~ SOLN
6.0000 [IU] | Freq: Once | SUBCUTANEOUS | Status: AC
  Administered 2011-12-19: 6 [IU] via SUBCUTANEOUS

## 2011-12-19 MED ORDER — MORPHINE SULFATE 2 MG/ML IJ SOLN: 2.0000 mg | INTRAMUSCULAR | Status: DC | PRN

## 2011-12-19 MED ORDER — INSULIN ASPART 100 UNIT/ML ~~LOC~~ SOLN
18.0000 [IU] | Freq: Once | SUBCUTANEOUS | Status: AC
  Administered 2011-12-19: 18 [IU] via SUBCUTANEOUS

## 2011-12-19 MED ORDER — SIMVASTATIN 40 MG PO TABS
40.0000 mg | ORAL_TABLET | Freq: Every day | ORAL | Status: DC
  Administered 2011-12-19: 40 mg via ORAL
  Filled 2011-12-19 (×2): qty 1

## 2011-12-19 SURGICAL SUPPLY — 57 items
APL SKNCLS STERI-STRIP NONHPOA (GAUZE/BANDAGES/DRESSINGS) ×1
BANDAGE GAUZE ELAST BULKY 4 IN (GAUZE/BANDAGES/DRESSINGS) ×4 IMPLANT
BENZOIN TINCTURE PRP APPL 2/3 (GAUZE/BANDAGES/DRESSINGS) ×2 IMPLANT
BIT DRILL SM SPINE QC 12 (BIT) ×1 IMPLANT
BLADE ULTRA TIP 2M (BLADE) ×2 IMPLANT
BUR BARREL STRAIGHT FLUTE 4.0 (BURR) IMPLANT
BUR MATCHSTICK NEURO 3.0 LAGG (BURR) ×2 IMPLANT
CANISTER SUCTION 2500CC (MISCELLANEOUS) ×2 IMPLANT
CLOTH BEACON ORANGE TIMEOUT ST (SAFETY) ×2 IMPLANT
CONT SPEC 4OZ CLIKSEAL STRL BL (MISCELLANEOUS) ×2 IMPLANT
COVER MAYO STAND STRL (DRAPES) ×2 IMPLANT
DRAIN JACKSON PRATT 10MM FLAT (MISCELLANEOUS) ×1 IMPLANT
DRAPE LAPAROTOMY 100X72 PEDS (DRAPES) ×2 IMPLANT
DRAPE MICROSCOPE LEICA (MISCELLANEOUS) ×2 IMPLANT
DRAPE POUCH INSTRU U-SHP 10X18 (DRAPES) ×2 IMPLANT
DURAPREP 6ML APPLICATOR 50/CS (WOUND CARE) ×2 IMPLANT
ELECT REM PT RETURN 9FT ADLT (ELECTROSURGICAL) ×2
ELECTRODE REM PT RTRN 9FT ADLT (ELECTROSURGICAL) ×1 IMPLANT
EVACUATOR SILICONE 100CC (DRAIN) ×1 IMPLANT
GAUZE SPONGE 4X4 16PLY XRAY LF (GAUZE/BANDAGES/DRESSINGS) IMPLANT
GLOVE BIOGEL M 8.0 STRL (GLOVE) ×2 IMPLANT
GLOVE BIOGEL PI IND STRL 7.5 (GLOVE) IMPLANT
GLOVE BIOGEL PI INDICATOR 7.5 (GLOVE) ×2
GLOVE ECLIPSE 6.5 STRL STRAW (GLOVE) ×1 IMPLANT
GLOVE ECLIPSE 7.5 STRL STRAW (GLOVE) ×3 IMPLANT
GLOVE EXAM NITRILE LRG STRL (GLOVE) IMPLANT
GLOVE EXAM NITRILE MD LF STRL (GLOVE) IMPLANT
GLOVE EXAM NITRILE XL STR (GLOVE) IMPLANT
GLOVE EXAM NITRILE XS STR PU (GLOVE) IMPLANT
GOWN BRE IMP SLV AUR LG STRL (GOWN DISPOSABLE) ×1 IMPLANT
GOWN BRE IMP SLV AUR XL STRL (GOWN DISPOSABLE) ×3 IMPLANT
GOWN STRL REIN 2XL LVL4 (GOWN DISPOSABLE) IMPLANT
HEAD HALTER (SOFTGOODS) ×2 IMPLANT
HEMOSTAT POWDER KIT SURGIFOAM (HEMOSTASIS) IMPLANT
HEMOSTAT POWDER SURGIFOAM 1G (HEMOSTASIS) ×1 IMPLANT
KIT BASIN OR (CUSTOM PROCEDURE TRAY) ×2 IMPLANT
KIT ROOM TURNOVER OR (KITS) ×2 IMPLANT
NDL SPNL 22GX3.5 QUINCKE BK (NEEDLE) ×1 IMPLANT
NEEDLE SPNL 22GX3.5 QUINCKE BK (NEEDLE) ×2 IMPLANT
NS IRRIG 1000ML POUR BTL (IV SOLUTION) ×2 IMPLANT
PACK LAMINECTOMY NEURO (CUSTOM PROCEDURE TRAY) ×2 IMPLANT
PATTIES SURGICAL .5 X1 (DISPOSABLE) ×2 IMPLANT
PLATE ANT CERV XTEND 2 LV 32 (Plate) ×1 IMPLANT
PUTTY DBX 1CC (Putty) ×2 IMPLANT
PUTTY DBX 1CC DEPUY (Putty) IMPLANT
RUBBERBAND STERILE (MISCELLANEOUS) ×4 IMPLANT
SCREW XTD VAR 4.2 SELF TAP 12 (Screw) ×6 IMPLANT
SPACER ACDF SM LORDOTIC 7 (Spacer) ×2 IMPLANT
SPONGE GAUZE 4X4 12PLY (GAUZE/BANDAGES/DRESSINGS) ×2 IMPLANT
SPONGE INTESTINAL PEANUT (DISPOSABLE) ×2 IMPLANT
SPONGE SURGIFOAM ABS GEL SZ50 (HEMOSTASIS) ×2 IMPLANT
STRIP CLOSURE SKIN 1/2X4 (GAUZE/BANDAGES/DRESSINGS) ×2 IMPLANT
SUT VIC AB 3-0 SH 8-18 (SUTURE) ×3 IMPLANT
SYR 20ML ECCENTRIC (SYRINGE) ×2 IMPLANT
TOWEL OR 17X24 6PK STRL BLUE (TOWEL DISPOSABLE) ×2 IMPLANT
TOWEL OR 17X26 10 PK STRL BLUE (TOWEL DISPOSABLE) ×2 IMPLANT
WATER STERILE IRR 1000ML POUR (IV SOLUTION) ×2 IMPLANT

## 2011-12-19 NOTE — Anesthesia Preprocedure Evaluation (Addendum)
Anesthesia Evaluation  Patient identified by MRN, date of birth, ID band Patient awake    Reviewed: Allergy & Precautions, H&P , NPO status , Patient's Chart, lab work & pertinent test results  Airway Mallampati: II TM Distance: >3 FB Neck ROM: Full    Dental   Pulmonary COPDCurrent Smoker,  + rhonchi         Cardiovascular     Neuro/Psych    GI/Hepatic hiatal hernia, GERD-  ,  Endo/Other  Diabetes mellitus-, Oral Hypoglycemic Agents  Renal/GU      Musculoskeletal   Abdominal   Peds  Hematology   Anesthesia Other Findings hoarse  Reproductive/Obstetrics                          Anesthesia Physical Anesthesia Plan  ASA: III  Anesthesia Plan: General   Post-op Pain Management:    Induction: Intravenous  Airway Management Planned: Oral ETT  Additional Equipment:   Intra-op Plan:   Post-operative Plan: Extubation in OR  Informed Consent: I have reviewed the patients History and Physical, chart, labs and discussed the procedure including the risks, benefits and alternatives for the proposed anesthesia with the patient or authorized representative who has indicated his/her understanding and acceptance.   Dental advisory given  Plan Discussed with: CRNA and Surgeon  Anesthesia Plan Comments: (novolog 6 units sq for glu 254 preop)      Anesthesia Quick Evaluation

## 2011-12-19 NOTE — H&P (Signed)
[name] is an 51 y.o. male.   Chief Complaint: neck pain. HPI: patient who in the past had cervical fusion at the level of c34. Lately he is having neck pain with radiation to shoulder with any type of movement specially with lateralization.last week, i gave him an aspen brace to wear on and to notice increase or decreasof pain . The pain was gone when he was wearing it and surfaced when it was off. Because of clinically and radiological findings he is admitted for surgery   Past Medical History  Diagnosis Date  . Esophageal erosions 05/24/01    egd by Dr. Jena Gauss  . Diabetes mellitus     type 2  . Hypercholesteremia   . Back pain   . COPD (chronic obstructive pulmonary disease)   . Hyperlipidemia   . GERD (gastroesophageal reflux disease)   . H/O hiatal hernia   . Arthritis     Past Surgical History  Procedure Date  . Esophagogastroduodenoscopy 05/24/01    by Dr. Jena Gauss  . Back surgery     x3  . Shoulder surgery     right  . Pelvic abcess drainage     Family History  Problem Relation Age of Onset  . Anesthesia problems Neg Hx    Social History:  reports that he has been smoking.  He does not have any smokeless tobacco history on file. He reports that he drinks alcohol. He reports that he does not use illicit drugs.  Allergies: No Known Allergies  No prescriptions prior to admission    No results found for this or any previous visit (from the past 48 hour(s)). No results found.  Review of Systems  Constitutional: Negative.   HENT: Positive for neck pain.   Eyes: Negative.   Respiratory: Negative.   Cardiovascular: Negative.   Genitourinary: Negative.   Skin: Negative.   Neurological: Positive for headaches.  Endo/Heme/Allergies: Negative.   Psychiatric/Behavioral: Negative.     Height 5\' 11"  (1.803 m), weight 87.544 kg (193 lb). Physical Exam hent, nl. Neck, anterior scar. Pain goes to both shoulders associated with mobility.cv,nl. Lungs, ronchii  bilaterally.. Abdomen, nl. Extremities, nl.NEURO mild weakness of deltoids dtr,nl. Sensory ,nl..xrays of the cervical spine showed, step off at c45 and 56. Myelogram showed fusion at c34. Spondylosis at c45 56.    Assessment/Plan Decompression aand fusion at c45 and 56. Patient aware of risks and benefits of the surgery  Whitman Meinhardt M 12/19/2011, 10:39 AM

## 2011-12-19 NOTE — Preoperative (Signed)
Beta Blockers   Reason not to administer Beta Blockers:Not Applicable 

## 2011-12-19 NOTE — Transfer of Care (Signed)
Immediate Anesthesia Transfer of Care Note  Patient: [name]  Procedure(s) Performed: Procedure(s) (LRB): ANTERIOR CERVICAL DECOMPRESSION/DISCECTOMY FUSION 2 LEVELS (N/A)  Patient Location: PACU  Anesthesia Type: General  Level of Consciousness: awake, alert  and oriented  Airway & Oxygen Therapy: Patient Spontanous Breathing and Patient connected to nasal cannula oxygen  Post-op Assessment: Report given to PACU RN, Post -op Vital signs reviewed and stable and Patient moving all extremities X 4  Post vital signs: Reviewed and stable  Complications: No apparent anesthesia complications

## 2011-12-19 NOTE — Progress Notes (Signed)
NOTIFIED DR Gypsy Balsam OF EKG DONE TODAY, HE WILL EVALUATE IN OR.  DR Gypsy Balsam ALSO MADE AWARE OF CBG 253 AND ORDER WAS RECEIVED BY ANN, RN.

## 2011-12-19 NOTE — Progress Notes (Signed)
Dr. Gypsy Balsam notified of CBG 253- order received to give 6 units Novolog SQ.

## 2011-12-19 NOTE — Anesthesia Postprocedure Evaluation (Signed)
  Anesthesia Post-op Note  Patient: [name]  Procedure(s) Performed: Procedure(s) (LRB): ANTERIOR CERVICAL DECOMPRESSION/DISCECTOMY FUSION 2 LEVELS (N/A)  Patient Location: PACU  Anesthesia Type: General  Level of Consciousness: awake  Airway and Oxygen Therapy: Patient Spontanous Breathing  Post-op Pain: mild  Post-op Assessment: Post-op Vital signs reviewed, Patient's Cardiovascular Status Stable, Respiratory Function Stable, Patent Airway, No signs of Nausea or vomiting and Pain level controlled  Post-op Vital Signs: stable  Complications: No apparent anesthesia complications

## 2011-12-19 NOTE — Anesthesia Procedure Notes (Signed)
Procedure Name: Intubation Date/Time: 12/19/2011 12:25 PM Performed by: Elon Alas Pre-anesthesia Checklist: Patient identified, Timeout performed, Emergency Drugs available, Suction available and Patient being monitored Patient Re-evaluated:Patient Re-evaluated prior to inductionOxygen Delivery Method: Circle system utilized Preoxygenation: Pre-oxygenation with 100% oxygen Intubation Type: IV induction and Cricoid Pressure applied Ventilation: Mask ventilation without difficulty Laryngoscope Size: Mac and 4 Grade View: Grade I Tube type: Oral Tube size: 7.5 mm Number of attempts: 1 Airway Equipment and Method: Stylet and LTA kit utilized Placement Confirmation: ETT inserted through vocal cords under direct vision,  positive ETCO2 and breath sounds checked- equal and bilateral Secured at: 23 cm Tube secured with: Tape Dental Injury: Teeth and Oropharynx as per pre-operative assessment

## 2011-12-19 NOTE — Progress Notes (Signed)
Decompression and fusion was done at c45 56. Op note [id number]

## 2011-12-20 ENCOUNTER — Encounter (HOSPITAL_COMMUNITY): Payer: Self-pay | Admitting: Neurosurgery

## 2011-12-20 LAB — GLUCOSE, CAPILLARY
Glucose-Capillary: 475 mg/dL — ABNORMAL HIGH (ref 70–99)
Glucose-Capillary: 334 mg/dL — ABNORMAL HIGH (ref 70–99)

## 2011-12-20 NOTE — Care Management Note (Signed)
    Page 1 of 1   12/20/2011     4:33:08 PM   CARE MANAGEMENT NOTE 12/20/2011  Patient:  [name], [name]   Account Number:  [id number]  Date Initiated:  12/20/2011  Documentation initiated by:  Anette Guarneri  Subjective/Objective Assessment:   POD#1 s/p ACDF  ambulatory, independent pta  no DME or HH needs.     Action/Plan:   d/c home self care   Anticipated DC Date:  12/20/2011   Anticipated DC Plan:           Choice offered to / List presented to:             Status of service:  Completed, signed off Medicare Important Message given?   (If response is "NO", the following Medicare IM given date fields will be blank) Date Medicare IM given:   Date Additional Medicare IM given:    Discharge Disposition:  HOME/SELF CARE  Per UR Regulation:  Reviewed for med. necessity/level of care/duration of stay  If discussed at Long Length of Stay Meetings, dates discussed:    Comments:

## 2011-12-20 NOTE — Discharge Instructions (Signed)
Wound Care °Leave incision open to air. °You may shower. °Do not scrub directly on incision.  °Leave steri-strips on neck.  They will fall off themselves. °Do not put any creams, lotions, or ointments on incision. °Activity °Walk each and every day, increasing distance each day. °No lifting greater than 5 lbs.  Avoid excessive neck motion. °No driving for 2 weeks; may ride as a passenger locally. °Wear neck brace at all times except when showering.  If provided soft collar, may wear for comfort unless otherwise instructed. °Diet °Resume your normal diet.  °Return to Work °Will be discussed at you follow up appointment. °Call Your Doctor If Any of These Occur °Redness, drainage, or swelling at the wound.  °Temperature greater than 101 degrees. °Severe pain not relieved by pain medication. °Increased difficulty swallowing. °Incision starts to come apart. °Follow Up Appt °Call today for appointment in 3-4 weeks ([phone]) or for problems.  If you have any hardware placed in your spine, you will need an x-ray before your appointment. ° °

## 2011-12-20 NOTE — Discharge Summary (Signed)
Physician Discharge Summary  Patient ID: [name] MRN: [id number] DOB/AGE: Aug 18, 1960 51 y.o.  Admit date: 12/19/2011 Discharge date: 12/20/2011  Admission Diagnoses:ddd c45 56,. dm  Discharge Diagnoses: same   Discharged Condition: no pain, no weakness  Hospital Course: c45 56 fusion 12/19/11  Consults: none  Significant Diagnostic Studies: myelogram  Treatments: surgery  Discharge Exam: Blood pressure 135/78, pulse 74, temperature 97.4 F (36.3 C), temperature source Oral, resp. rate 16, height 5\' 11"  (1.803 m), weight 87.544 kg (193 lb), SpO2 94.00%. No weakness. No pain  Disposition: home. To call me to have drain removed  Discharge Orders    Future Appointments: Provider: Department: Dept Phone: Center:   12/27/2011 9:30 AM Joselyn Arrow, NP [facility] [phone] [facility]     Medication List  As of 12/20/2011  9:08 AM   ASK your doctor about these medications         dexlansoprazole 60 MG capsule   Commonly known as: DEXILANT   Take 60 mg by mouth daily.      metFORMIN 1000 MG tablet   Commonly known as: GLUCOPHAGE   Take 1,000 mg by mouth 2 (two) times daily with a meal.      oxyCODONE-acetaminophen 5-500 MG per capsule   Commonly known as: TYLOX   Take 1 capsule by mouth every 4 (four) hours as needed. QID      simvastatin 40 MG tablet   Commonly known as: ZOCOR   Take 40 mg by mouth daily.             Signed: Karn Cassis 12/20/2011, 9:08 AM

## 2011-12-20 NOTE — Progress Notes (Signed)
Utilization review completed. Brittain Hosie, RN, BSN. 12/20/11  

## 2011-12-20 NOTE — Progress Notes (Signed)
Patient ID: [name], male   DOB: August 10, 1960, 51 y.o.   MRN: [id number] No pain. Swallows well. No weakness. Still some drainage. Wants to go home. Plan, dc today and to come back in 24-48 hours to remove drain. i am on call over the weekend.

## 2011-12-20 NOTE — Op Note (Signed)
NAMELECIL, TAPP NO.:  [id number]  MEDICAL RECORD NO.:  [id number]  LOCATION:  3535                         FACILITY:  [facility]  PHYSICIAN:  Hilda Lias, M.D.   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  PROCEDURE:  C4-5, C5-6 diskectomy, decompression of the spinal cord, bilateral foraminotomy, lysis of adhesion.  Cages, plate, microscope.  SURGEON:  Hilda Lias, M.D.  ASSISTANT:  Hewitt Shorts, M.D.  HISTORY OF PRESENT ILLNESS:  Mr. Nouri is a 51 year old gentleman who about 13 years ago had a fusion at the level of C3-C4.  Lately, he had been complaining of neck pain which mostly is associated with movement. He has mild weakness of the deltoid.  We did a myelogram which shows spondylosis at the level of 4-5, 5-6.  The myelogram was most impressive with plain x-ray while standing__________ CT scan, showed spondylosis  __________ Aspen brace, utilized and then being immobilized, the pain went completely away.  From then on, surgery was advised.  He knew the risk of the surgery.  PROCEDURE:  The patient was taken to the OR and after intubation, the left side of the neck was cleaned with DuraPrep.  A transverse incision from the previous one was made through the skin, subcutaneous tissue, and platysma.  We found quite a bit of adhesion from previous surgery. Lysis was accomplished.  We were able to go straight to the cervical spine.  The patient had so much adhesions, we were unable to see the old plate.  X-ray showed that the needle was at the level of C4-5 and the spreader was at the level of C5-6.  With the drill, we drilled the calcified ligament at both  levels.  We entered the disk at the level of C4-5.  The patient had quite a bit of degenerative disk disease.  It was difficult for [place] to identify posterior ligament.  The patient had quite a bit of adhesion and finally we were able to get a space between the dura mater and and the posterior ligament__________.  Decompression of the spinal cord as well as the C4-5 level was achieved.  The same procedure was done with the same finding at the level of C5-6.  From then on, the endplates were drilled. Two cages of 7 mm height, lordotic, DBX and autograft in fact was inserted.  This was followed by using a plate from C4 to C7. Lateral cervical spine showed good position of the cage and screws.  The area was irrigated.  The drain was closed.  The wound was closed with Vicryl and Steri-Strips.  The patient is going to go to PACU and _he needs to wear the_________ brace for at least 6 weeks.          ______________________________ Hilda Lias, M.D.     EB/MEDQ  D:  12/19/2011  T:  12/20/2011  Job:  [id number] while standing

## 2011-12-20 NOTE — Progress Notes (Signed)
PT. UP AD LIB IN HALL WITH STEADY GAIT, TOLERATING DIET AND PO PAIN MEDICATIONS. PT. VOIDING WITHOUT DIFFICULTIES.  V/S AT 0812 T=97.4, P=74, R=16, B/P=135/78, SAT=94% ON ROOM AIR.  PT. VERBALIZED UNDERSTANDING OF D/C ORDERS. SPOUSE AND STAFF ASSISTING PT. DOWN TO CAR VIA W/C. NO DISTRESS NOTED.   CHRIS Sadiq Mccauley RN

## 2011-12-21 LAB — GLUCOSE, CAPILLARY: Glucose-Capillary: 253 mg/dL — ABNORMAL HIGH (ref 70–99)

## 2011-12-25 ENCOUNTER — Emergency Department (HOSPITAL_COMMUNITY): Payer: Medicare Other

## 2011-12-25 ENCOUNTER — Emergency Department (HOSPITAL_COMMUNITY)
Admission: EM | Admit: 2011-12-25 | Discharge: 2011-12-26 | Discharge status: Against Medical Advice | Payer: Medicare Other | Attending: Emergency Medicine | Admitting: Emergency Medicine

## 2011-12-25 ENCOUNTER — Encounter (HOSPITAL_COMMUNITY): Payer: Self-pay | Admitting: *Deleted

## 2011-12-25 DIAGNOSIS — J4489 Other specified chronic obstructive pulmonary disease: Secondary | ICD-10-CM | POA: Insufficient documentation

## 2011-12-25 DIAGNOSIS — R059 Cough, unspecified: Secondary | ICD-10-CM | POA: Insufficient documentation

## 2011-12-25 DIAGNOSIS — M542 Cervicalgia: Secondary | ICD-10-CM | POA: Insufficient documentation

## 2011-12-25 DIAGNOSIS — E878 Other disorders of electrolyte and fluid balance, not elsewhere classified: Secondary | ICD-10-CM | POA: Insufficient documentation

## 2011-12-25 DIAGNOSIS — R1310 Dysphagia, unspecified: Secondary | ICD-10-CM | POA: Insufficient documentation

## 2011-12-25 DIAGNOSIS — E860 Dehydration: Secondary | ICD-10-CM

## 2011-12-25 DIAGNOSIS — J449 Chronic obstructive pulmonary disease, unspecified: Secondary | ICD-10-CM | POA: Insufficient documentation

## 2011-12-25 DIAGNOSIS — G8918 Other acute postprocedural pain: Secondary | ICD-10-CM | POA: Insufficient documentation

## 2011-12-25 DIAGNOSIS — E871 Hypo-osmolality and hyponatremia: Secondary | ICD-10-CM

## 2011-12-25 DIAGNOSIS — R05 Cough: Secondary | ICD-10-CM | POA: Insufficient documentation

## 2011-12-25 DIAGNOSIS — E119 Type 2 diabetes mellitus without complications: Secondary | ICD-10-CM | POA: Insufficient documentation

## 2011-12-25 DIAGNOSIS — K219 Gastro-esophageal reflux disease without esophagitis: Secondary | ICD-10-CM | POA: Insufficient documentation

## 2011-12-25 DIAGNOSIS — F17200 Nicotine dependence, unspecified, uncomplicated: Secondary | ICD-10-CM | POA: Insufficient documentation

## 2011-12-25 DIAGNOSIS — E785 Hyperlipidemia, unspecified: Secondary | ICD-10-CM | POA: Insufficient documentation

## 2011-12-25 LAB — DIFFERENTIAL
Basophils Relative: 0 % (ref 0–1)
Eosinophils Relative: 2 % (ref 0–5)
Monocytes Absolute: 1.1 10*3/uL — ABNORMAL HIGH (ref 0.1–1.0)
Monocytes Relative: 10 % (ref 3–12)
Neutro Abs: 7.5 10*3/uL (ref 1.7–7.7)

## 2011-12-25 LAB — CBC
HCT: 45.3 % (ref 39.0–52.0)
Hemoglobin: 16.2 g/dL (ref 13.0–17.0)
MCH: 32.5 pg (ref 26.0–34.0)
MCHC: 35.8 g/dL (ref 30.0–36.0)
MCV: 90.8 fL (ref 78.0–100.0)

## 2011-12-25 LAB — COMPREHENSIVE METABOLIC PANEL
Albumin: 3.7 g/dL (ref 3.5–5.2)
BUN: 17 mg/dL (ref 6–23)
Chloride: 89 mEq/L — ABNORMAL LOW (ref 96–112)
Creatinine, Ser: 0.6 mg/dL (ref 0.50–1.35)
GFR calc non Af Amer: >90 mL/min (ref >90)
Total Bilirubin: 0.6 mg/dL (ref 0.3–1.2)

## 2011-12-25 MED ORDER — MORPHINE SULFATE 4 MG/ML IJ SOLN
4.0000 mg | Freq: Once | INTRAMUSCULAR | Status: AC
  Administered 2011-12-25: 4 mg via INTRAVENOUS
  Filled 2011-12-25: qty 1

## 2011-12-25 MED ORDER — SODIUM CHLORIDE 0.9 % IV SOLN: INTRAVENOUS | Status: DC

## 2011-12-25 MED ORDER — SODIUM CHLORIDE 0.9 % IV BOLUS (SEPSIS)
1000.0000 mL | Freq: Once | INTRAVENOUS | Status: AC
  Administered 2011-12-25: 1000 mL via INTRAVENOUS

## 2011-12-25 MED ORDER — ONDANSETRON HCL 4 MG/2ML IJ SOLN
4.0000 mg | Freq: Once | INTRAMUSCULAR | Status: AC
  Administered 2011-12-25: 4 mg via INTRAVENOUS
  Filled 2011-12-25: qty 2

## 2011-12-25 MED ORDER — IOHEXOL 300 MG/ML  SOLN
75.0000 mL | Freq: Once | INTRAMUSCULAR | Status: AC | PRN
  Administered 2011-12-25: 75 mL via INTRAVENOUS

## 2011-12-25 MED ORDER — HYDROMORPHONE HCL PF 1 MG/ML IJ SOLN
1.0000 mg | Freq: Once | INTRAMUSCULAR | Status: AC
  Administered 2011-12-26: 1 mg via INTRAVENOUS
  Filled 2011-12-25: qty 1

## 2011-12-25 NOTE — ED Provider Notes (Cosign Needed)
History  This chart was scribed for Ward Givens, MD by Bennett Scrape. This patient was seen in room APA15/APA15 and the patient's care was started at 8:46PM.  CSN: [id number]  Arrival date & time 12/25/11  2028   First MD Initiated Contact with Patient 12/25/11 2046      Chief Complaint  Patient presents with  . Dysphagia    The history is provided by the patient and the spouse. No language interpreter was used.    [name] is a 51 y.o. male who presents to the Emergency Department complaining of 3 days of gradual onset, non-changing, constant dysphagia. Wife states that pt had an anterior cervical discectomy fusion 6 days ago by Dr. Jeral Fruit. She states that he was discharged from the hospital with a back brace and one drainage tube in his neck. Wife states that the drainage tube did drain quite a bit of fluid. Pt states that he had the tube removed 3 days ago. Wife states that liquids, food and medications are now getting stuck about halfway down and the pt coughs it back up. Pt states that he has not been able to eat, drink or take his medications in 3 days and is "starving" and "in a lot of pain". Wife also reports that pt's "breath smells like infection". Pt is concerned that he developed some kind of infection or blockage when the drainage tube was removed. Pt denies fever, nausea, emesis and HA as associated symptoms. He has a h/o DM, COPD, GERD and HLD. He is a current everyday smoker and occasional alcohol user.  PCP is Dr. Juanetta Gosling.  Neurosurgery Dr Jeral Fruit   Past Medical History  Diagnosis Date  . Esophageal erosions 05/24/01    egd by Dr. Jena Gauss  . Diabetes mellitus     type 2  . Hypercholesteremia   . Back pain   . COPD (chronic obstructive pulmonary disease)   . Hyperlipidemia   . GERD (gastroesophageal reflux disease)   . H/O hiatal hernia   . Arthritis     Past Surgical History  Procedure Date  . Esophagogastroduodenoscopy 05/24/01    by Dr. Jena Gauss  . Back  surgery     x3  . Shoulder surgery     right  . Pelvic abcess drainage   . Anterior cervical decomp/discectomy fusion 12/19/2011    Procedure: ANTERIOR CERVICAL DECOMPRESSION/DISCECTOMY FUSION 2 LEVELS;  Surgeon: Karn Cassis, MD;  Location: [facility];  Service: Neurosurgery;  Laterality: N/A;  Cervical four-five Cervical five-six  Anterior cervical decompression/diskectomy, fusion, plate    Family History  Problem Relation Age of Onset  . Anesthesia problems Neg Hx     History  Substance Use Topics  . Smoking status: Current Everyday Smoker -- 1.5 packs/day  . Smokeless tobacco: Not on file   Comment: Smokes one and a half packs of ciggarettes daily  . Alcohol Use: Yes     occasiona  Lives with spouse Lives at home     Review of Systems  Constitutional: Negative for fever and chills.  HENT: Positive for trouble swallowing and neck pain (from the surgery). Negative for congestion.   Respiratory: Positive for cough. Negative for shortness of breath.   Gastrointestinal: Negative for nausea and vomiting.  All other systems reviewed and are negative.    Allergies  Review of patient's allergies indicates no known allergies.  Home Medications   Current Outpatient Rx  Name Route Sig Dispense Refill  . DEXLANSOPRAZOLE 60 MG PO  CPDR Oral Take 60 mg by mouth daily.    Marland Kitchen SIMVASTATIN 40 MG PO TABS Oral Take 40 mg by mouth daily.      Triage Vitals: BP 156/92  Pulse 105  Temp(Src) 97.8 F (36.6 C) (Oral)  Resp 18  Ht 5\' 11"  (1.803 m)  Wt 193 lb (87.544 kg)  BMI 26.92 kg/m2  SpO2 95%  Vital signs normal except tachycardia  Physical Exam  Nursing note and vitals reviewed. Constitutional: He is oriented to person, place, and time. He appears well-developed and well-nourished. No distress.  HENT:  Head: Normocephalic and atraumatic.  Right Ear: External ear normal.  Left Ear: External ear normal.  Nose: Nose normal.       Dry mucus membranes  Eyes:  Conjunctivae are normal. Pupils are equal, round, and reactive to light.  Neck: Normal range of motion. Neck supple. No tracheal deviation present.       Patient has swelling on the left side of his neck, his surgical wound appears to be healing.  Cardiovascular: Normal rate, regular rhythm and normal heart sounds.   Pulmonary/Chest: Effort normal and breath sounds normal. No respiratory distress. He has no wheezes. He has no rales. He exhibits no tenderness.  Musculoskeletal: He exhibits no edema.       Pt is in a back brace  Neurological: He is alert and oriented to person, place, and time. No cranial nerve deficit.  Skin: Skin is warm and dry.       Well-healing surgical scars on his neck, no sign of infection  Psychiatric: He has a normal mood and affect. His behavior is normal.    ED Course  Procedures (including critical care time)  DIAGNOSTIC STUDIES: Oxygen Saturation is 95% on room air, adequate by my interpretation.    COORDINATION OF CARE: 9:04PM-Discussed treatment plan which includes a CT scan of the neck with pt and pt agreed to plan.  00:50 Dr Gerlene Fee will discuss CT with radiology 00:52 Dr Gerlene Fee, give decadron 10 mg IV now and again at 4 am, if not better, call back and will admit.   01:30 now demanding to go outside and smoke, he was advised this was a smoke-free facility and we cannot let him go outside to smoke with an IV in place. Patient has drank a whole cup of fluid and states he's feeling better. Patient will be allowed to sign out AMA   Medications  0.9 %  sodium chloride infusion (not administered)  dexamethasone (DECADRON) injection 10 mg (not administered)  dexamethasone (DECADRON) injection 10 mg (not administered)  sodium chloride 0.9 % bolus 1,000 mL (1000 mL Intravenous Given 12/25/11 2133)  morphine 4 MG/ML injection 4 mg (4 mg Intravenous Given 12/25/11 2134)  ondansetron (ZOFRAN) injection 4 mg (4 mg Intravenous Given 12/25/11 2134)  iohexol  (OMNIPAQUE) 300 MG/ML solution 75 mL (75 mL Intravenous Contrast Given 12/25/11 2236)  morphine 4 MG/ML injection 4 mg (4 mg Intravenous Given 12/25/11 2245)  HYDROmorphone (DILAUDID) injection 1 mg (1 mg Intravenous Given 12/26/11 0006)      Results for orders placed during the hospital encounter of 12/25/11  CBC      Component Value Range   WBC 11.4 (*) 4.0 - 10.5 (K/uL)   RBC 4.99  4.22 - 5.81 (MIL/uL)   Hemoglobin 16.2  13.0 - 17.0 (g/dL)   HCT 19.1  47.8 - 29.5 (%)   MCV 90.8  78.0 - 100.0 (fL)   MCH 32.5  26.0 - 34.0 (  pg)   MCHC 35.8  30.0 - 36.0 (g/dL)   RDW 40.9  81.1 - 91.4 (%)   Platelets 175  150 - 400 (K/uL)  DIFFERENTIAL      Component Value Range   Neutrophils Relative 65  43 - 77 (%)   Neutro Abs 7.5  1.7 - 7.7 (K/uL)   Lymphocytes Relative 23  12 - 46 (%)   Lymphs Abs 2.6  0.7 - 4.0 (K/uL)   Monocytes Relative 10  3 - 12 (%)   Monocytes Absolute 1.1 (*) 0.1 - 1.0 (K/uL)   Eosinophils Relative 2  0 - 5 (%)   Eosinophils Absolute 0.2  0.0 - 0.7 (K/uL)   Basophils Relative 0  0 - 1 (%)   Basophils Absolute 0.0  0.0 - 0.1 (K/uL)  COMPREHENSIVE METABOLIC PANEL      Component Value Range   Sodium 131 (*) 135 - 145 (mEq/L)   Potassium 3.6  3.5 - 5.1 (mEq/L)   Chloride 89 (*) 96 - 112 (mEq/L)   CO2 29  19 - 32 (mEq/L)   Glucose, Bld 289 (*) 70 - 99 (mg/dL)   BUN 17  6 - 23 (mg/dL)   Creatinine, Ser 7.82  0.50 - 1.35 (mg/dL)   Calcium 95.6  8.4 - 10.5 (mg/dL)   Total Protein 8.4 (*) 6.0 - 8.3 (g/dL)   Albumin 3.7  3.5 - 5.2 (g/dL)   AST 16  0 - 37 (U/L)   ALT 21  0 - 53 (U/L)   Alkaline Phosphatase 126 (*) 39 - 117 (U/L)   Total Bilirubin 0.6  0.3 - 1.2 (mg/dL)   GFR calc non Af Amer >90  >90 (mL/min)   GFR calc Af Amer >90  >90 (mL/min)   Laboratory interpretation all normal except -oh natremia and low chloride consistent with dehydration, mild leukocytosis hyperglycemia,   Dg Cervical Spine 2-3 Views  12/19/2011  *RADIOLOGY REPORT*  Clinical Data: C4-6  ACDF.  CERVICAL SPINE - 2-3 VIEW  Comparison: Post-myelogram CT scan 12/06/2011.  Findings: Klippel Feil deformity with fusion of the C2-3 level is again seen.  Prior C3-4 fusion is again noted.  Provided images demonstrate localization of C4-5 and placement anterior plate and screws with interbody spacers at C4-6.  IMPRESSION: C4-6 ACDF.  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  Original Report Authenticated By: Bernadene Bell. Maricela Curet, M.D.    Dg Chest 2 View  12/25/2011  *RADIOLOGY REPORT*  Clinical Data: Difficulty swallowing following cervical spine surgery 6 days ago.  Smoker with cough.  CHEST - 2 VIEW  Comparison: Two-view chest x-ray 10/24/2011 and 06/08/2005.  Findings: Cardiomediastinal silhouette unremarkable, unchanged. Lungs clear.  Bronchovascular markings normal.  Pulmonary vascularity normal.  No pneumothorax.  No pleural effusions. Degenerative changes throughout the thoracic spine.  No significant interval change.  IMPRESSION: No acute cardiopulmonary disease.  Stable examination.  Original Report Authenticated By: Arnell Sieving, M.D.   Ct Soft Tissue Neck W Contrast  12/25/2011  *RADIOLOGY REPORT*  Clinical Data: Dysphagia and difficulty swallowing after cervical spine surgery 6 days ago.  CT NECK WITH CONTRAST  Technique:  Multidetector CT imaging of the neck was performed with intravenous contrast.  Contrast: 75mL OMNIPAQUE IOHEXOL 300 MG/ML.  Comparison: None.  Findings: Metallic beam hardening streak artifact from the cervical spine hardware.  Gas in the retropharyngeal soft tissues anterior to the spine at the operative site, as expected.  No associated fluid collection at this time  to confirm abscess.  The gas compresses the posterior pharynx, and likely accounts for the current symptoms.  Visualized cervical and upper thoracic esophagus normal in appearance.  Mild atherosclerosis involving the internal carotid arteries bilaterally, with no visible stenosis.   Parotid and submandibular salivary glands normal in appearance.  No significant lymphadenopathy in the neck.  Thyroid gland normal in appearance.  Mucous retention cysts in both the maxillary sinuses.  Mastoid air cells well-aerated.  Visualized lung apices clear.  Visualized superior mediastinum unremarkable apart from mild atherosclerosis involving the aortic arch.  IMPRESSION:  1.  Gas in the retropharyngeal soft tissues at the operative site in the lower cervical spine, without an associated fluid collection to suggest abscess. 2.  Severe multifactorial spinal stenosis at C3. 3.  No gas in the epidural space to suggest an epidural abscess.  If symptoms persist, an esophagram utilizing water soluble contrast would be suggested in further evaluation.  Original Report Authenticated By: Arnell Sieving, M.D.     1. Dysphagia   2. Dehydration   3. Post-op pain   4. Hyponatremia   5. Serum chloride decreased    Disposition signed out AMA   Devoria Albe, MD, FACEP   MDM    I personally performed the services described in this documentation, which was scribed in my presence. The recorded information has been reviewed and considered. Devoria Albe, MD, FACEP        Ward Givens, MD 12/26/11 1610  Ward Givens, MD 12/26/11 [id number]

## 2011-12-25 NOTE — ED Notes (Addendum)
Patient sitting upright in bed. Neck brace in place from surgery last Wednesday. Patient complaining of neck pain radiating down bilateral arms. Patient states "I am unable to swallow anything since my surgery, and I am unable to swallow my medication so I haven't had my medication since my surgery."

## 2011-12-25 NOTE — ED Notes (Signed)
Recent neck surgery, has back brace in place, dysphagia x 3 days, everything keeps coming back up

## 2011-12-26 ENCOUNTER — Encounter (HOSPITAL_COMMUNITY): Payer: Self-pay

## 2011-12-26 MED ORDER — DEXAMETHASONE SODIUM PHOSPHATE 4 MG/ML IJ SOLN: 10.0000 mg | Freq: Once | INTRAMUSCULAR | Status: DC

## 2011-12-26 MED ORDER — DEXAMETHASONE SODIUM PHOSPHATE 4 MG/ML IJ SOLN
10.0000 mg | Freq: Once | INTRAMUSCULAR | Status: AC
  Administered 2011-12-26: 10 mg via INTRAVENOUS
  Filled 2011-12-26: qty 1

## 2011-12-26 NOTE — ED Notes (Signed)
Patient states he wants to leave. States, "I'm ready to go home. I don't want to stay here any longer for anything else." Advised Dr Lynelle Doctor.

## 2011-12-27 ENCOUNTER — Ambulatory Visit: Payer: Medicare Other | Admitting: Urgent Care

## 2012-01-17 ENCOUNTER — Telehealth: Payer: Self-pay | Admitting: Urgent Care

## 2012-01-17 ENCOUNTER — Encounter: Payer: Self-pay | Admitting: Urgent Care

## 2012-01-17 ENCOUNTER — Ambulatory Visit (INDEPENDENT_AMBULATORY_CARE_PROVIDER_SITE_OTHER): Payer: Medicare Other | Admitting: Urgent Care

## 2012-01-17 VITALS — BP 140/90 | HR 78 | Temp 98.1°F | Ht 71.0 in | Wt 180.0 lb

## 2012-01-17 DIAGNOSIS — E119 Type 2 diabetes mellitus without complications: Secondary | ICD-10-CM

## 2012-01-17 DIAGNOSIS — Z720 Tobacco use: Secondary | ICD-10-CM

## 2012-01-17 DIAGNOSIS — I10 Essential (primary) hypertension: Secondary | ICD-10-CM

## 2012-01-17 DIAGNOSIS — R160 Hepatomegaly, not elsewhere classified: Secondary | ICD-10-CM | POA: Insufficient documentation

## 2012-01-17 DIAGNOSIS — Z860100 Personal history of colon polyps, unspecified: Secondary | ICD-10-CM

## 2012-01-17 DIAGNOSIS — R634 Abnormal weight loss: Secondary | ICD-10-CM | POA: Insufficient documentation

## 2012-01-17 DIAGNOSIS — R112 Nausea with vomiting, unspecified: Secondary | ICD-10-CM

## 2012-01-17 DIAGNOSIS — F17200 Nicotine dependence, unspecified, uncomplicated: Secondary | ICD-10-CM

## 2012-01-17 DIAGNOSIS — R059 Cough, unspecified: Secondary | ICD-10-CM

## 2012-01-17 DIAGNOSIS — R05 Cough: Secondary | ICD-10-CM

## 2012-01-17 NOTE — Assessment & Plan Note (Signed)
Chronic nonproductive cough-recent chest x-ray benign Quit smoking 1-800-quit now for help quitting smoking

## 2012-01-17 NOTE — Progress Notes (Signed)
Referring Provider: Fredirick Maudlin, MD Primary Care Physician:  Fredirick Maudlin, MD Primary Gastroenterologist:  Dr. Jena Gauss  Chief Complaint  Patient presents with  . Follow-up    HPI:  [name] is a 51 y.o. male here for follow up for abdominal pain and unintentional weight loss.  He has had a recent EGD and colonoscopy by Dr. Jena Gauss in January 2013. His nausea & abdominal pain has resolved. He is taking Dexilant 60mg  daily.  He was found to have tubular adenomas removed on last colonoscopy.   He continues to lose a significant amount of weight (see below) 16.6 pounds in the past 5 months. He has had recent neck surgery. Denies any lower GI symptoms including constipation, diarrhea, rectal bleeding, melena.  Recent CXR normal.  Does not believe thyroid has been checked recently.  C/o anorexia.  Drinks lots of liquids.  Blood sugars too high-200-500s.  Seeing Dr Fransico Him.  Not taking insulin as directed. He has had a chronic nonproductive cough. Denies any shortness of breath or wheezing. He continues to smoke.  Vitals - 1 value per visit 01/17/2012 12/26/2011 12/25/2011 12/20/2011  Weight (lb) 180  193    Vitals - 1 value per visit 12/19/2011 12/18/2011 12/06/2011 08/28/2011 08/17/2011  Weight (lb)  193   196.6   Past Medical History  Diagnosis Date  . Esophageal erosions 05/24/01    egd by Dr. Jena Gauss  . Diabetes mellitus     type 2  . Hypercholesteremia   . Back pain   . COPD (chronic obstructive pulmonary disease)   . Hyperlipidemia   . GERD (gastroesophageal reflux disease)   . H/O hiatal hernia   . Arthritis   . Tubular adenoma of colon 08/28/11    Past Surgical History  Procedure Date  . Esophagogastroduodenoscopy 05/24/01    by Dr. Jena Gauss  . Back surgery     x3  . Shoulder surgery     right  . Pelvic abcess drainage   . Anterior cervical decomp/discectomy fusion 12/19/2011    Procedure: ANTERIOR CERVICAL DECOMPRESSION/DISCECTOMY FUSION 2 LEVELS;  Surgeon: Karn Cassis, MD;   Location: [facility];  Service: Neurosurgery;  Laterality: N/A;  Cervical four-five Cervical five-six  Anterior cervical decompression/diskectomy, fusion, plate  . Esophagogastroduodenoscopy 08/28/11    Rourk-erosive reflux esophagitis, gastric erosions  . Colonoscopy w/ polypectomy 08/28/11    Rourk-tubular adenomas removed from ascending colon, suboptimal prep, diminutive rectal polyps    Current Outpatient Prescriptions  Medication Sig Dispense Refill  . dexlansoprazole (DEXILANT) 60 MG capsule Take 60 mg by mouth daily.      . diazepam (VALIUM) 5 MG tablet Take 5 mg by mouth every 6 (six) hours as needed. For anxiety/muscle relaxant      . metFORMIN (GLUCOPHAGE) 1000 MG tablet Take 1,000 mg by mouth 2 (two) times daily.      Marland Kitchen oxyCODONE-acetaminophen (TYLOX) 5-500 MG per capsule Take 1 capsule by mouth 4 (four) times daily.      . simvastatin (ZOCOR) 40 MG tablet Take 40 mg by mouth at bedtime.         Allergies as of 01/17/2012  . (No Known Allergies)    Review of Systems: See history of present illness, otherwise negative review of systems  Physical Exam: BP 157/96  Pulse 78  Temp 98.1 F (36.7 C) (Temporal)  Ht 5\' 11"  (1.803 m)  Wt 180 lb (81.647 kg)  BMI 25.10 kg/m2 General:   Alert,  Well-developed, pleasant and cooperative in  NAD.  Accompanied by his girlfriend. Eyes:  Sclera clear, no icterus.   Conjunctiva pink. Mouth:  No deformity or lesions, oropharynx pink and moist. Neck:  C-collar intact. Heart:  Regular rate and rhythm; no murmurs, clicks, rubs,  or gallops. Chest: Decreased breath sounds bilaterally. Chronic emphysematous changes. No acute distress. Abdomen:  Normal bowel sounds.  No bruits.  Soft, non-tender and non-distended.  Liver is palpable 4 finger breaths below the right costal margin.   No guarding or rebound tenderness.   Rectal:  Deferred. Msk:  Symmetrical without gross deformities.  Pulses:  Normal pulses noted. Extremities: + Clubbing. No  edema Neurologic:  Alert and oriented x4;  grossly normal neurologically. Skin:  Intact without significant lesions or rashes.

## 2012-01-17 NOTE — Assessment & Plan Note (Signed)
New hepatomegaly found on exam today. He does have history of chronic daily alcohol use and reports small amounts of beer. ? Underlying alcoholic liver disease. Given unintentional weight loss he will have CT scan as above.

## 2012-01-17 NOTE — Assessment & Plan Note (Signed)
[name] is a pleasant 51 y.o. male with chronic GERD (erosive esophagitis), diabetes mellitus, chronic cough and tobacco he use who presents with significant weight loss since January 2013. EGD and colonoscopy without cause of his weight loss. He has lost over 16 pounds the past 5 months. His diabetes mellitus is poorly controlled and this may very well be the culprit of his unintentional weight loss.  CT of chest abdomen and pelvis with IV and oral contrast to look for occult malignancy If this is benign, he would need tight control of his diabetes through Dr. Fransico Him Check TSH

## 2012-01-17 NOTE — Telephone Encounter (Signed)
NOTE: This [facility] member does not require prior authorization for OUTPATIENT Radiology through [facility] or [facility] at this time.

## 2012-01-17 NOTE — Patient Instructions (Addendum)
Next colonoscopy January 2018 Check you BP at home & call Dr. Fredirick Maudlin, MD if it remains elevated over 145/85. You need better control of your blood sugars. Be sure to discuss this with Dr. Juanetta Gosling and Dr. Fransico Him.  This may be contributing to your weight loss. 1-800-quit-now for help quitting smoking Get your lab work today I will call you with the results of your CT scan and blood work

## 2012-01-17 NOTE — Assessment & Plan Note (Signed)
Resolved

## 2012-01-17 NOTE — Progress Notes (Signed)
Faxed to PCP

## 2012-01-17 NOTE — Assessment & Plan Note (Addendum)
Followup with Dr. Fredirick Maudlin, MD and Dr. Fransico Him for tighter blood sugar control. He's also advised to check his blood pressures at home and call Dr. Juanetta Gosling if they remain elevated

## 2012-01-18 LAB — TSH: TSH: 1.762 u[IU]/mL (ref 0.350–4.500)

## 2012-01-21 ENCOUNTER — Ambulatory Visit (HOSPITAL_COMMUNITY): Payer: Medicare Other

## 2012-01-24 ENCOUNTER — Ambulatory Visit (HOSPITAL_COMMUNITY)
Admission: RE | Admit: 2012-01-24 | Discharge: 2012-01-24 | Disposition: A | Discharge status: Home / Self Care | Payer: Medicare Other | Source: Ambulatory Visit | Attending: Urgent Care | Admitting: Urgent Care

## 2012-01-24 DIAGNOSIS — Z720 Tobacco use: Secondary | ICD-10-CM

## 2012-01-24 DIAGNOSIS — R634 Abnormal weight loss: Secondary | ICD-10-CM | POA: Insufficient documentation

## 2012-01-24 DIAGNOSIS — R05 Cough: Secondary | ICD-10-CM | POA: Insufficient documentation

## 2012-01-24 DIAGNOSIS — R911 Solitary pulmonary nodule: Secondary | ICD-10-CM | POA: Insufficient documentation

## 2012-01-24 DIAGNOSIS — F17200 Nicotine dependence, unspecified, uncomplicated: Secondary | ICD-10-CM | POA: Insufficient documentation

## 2012-01-24 DIAGNOSIS — R059 Cough, unspecified: Secondary | ICD-10-CM | POA: Insufficient documentation

## 2012-01-24 MED ORDER — IOHEXOL 300 MG/ML  SOLN
100.0000 mL | Freq: Once | INTRAMUSCULAR | Status: AC | PRN
  Administered 2012-01-24: 100 mL via INTRAVENOUS

## 2012-01-25 NOTE — Progress Notes (Signed)
Quick Note:  Please call pt. His CT scans look OK. Nothing to explain his weight loss so that is great. I suspect his weight loss is due to poorly controlled DM & he should FU w/ Dr Fransico Him or Fredirick Maudlin, MD for this. He did have a very small nodule (5mm) in left lung. Since he is a smoker, he will need FU Chest CT 6 months. Stop Smoking! Please arrange CT chest 6 mo. OV 3 months FU weight loss Thanks ZO:[email or web address] L, MD    ______

## 2012-01-28 NOTE — Progress Notes (Signed)
TSH & Ct were faxed to PCP

## 2012-02-27 ENCOUNTER — Telehealth: Payer: Self-pay

## 2012-02-27 NOTE — Telephone Encounter (Signed)
Pt called wanting to know about the lung scan that he was supo to have. I did not see anything about a lung scan only a 6 month follow CT scan. Can you please look and let me know in the morning.

## 2012-02-28 NOTE — Telephone Encounter (Signed)
Chest CT 6 mo (see CT report)

## 2012-02-28 NOTE — Telephone Encounter (Signed)
Pt aware.

## 2012-04-22 ENCOUNTER — Encounter: Payer: Self-pay | Admitting: Internal Medicine

## 2012-06-30 ENCOUNTER — Other Ambulatory Visit: Payer: Self-pay

## 2012-06-30 MED ORDER — DEXLANSOPRAZOLE 60 MG PO CPDR: 60.0000 mg | DELAYED_RELEASE_CAPSULE | Freq: Every day | ORAL | Status: DC

## 2012-07-02 ENCOUNTER — Other Ambulatory Visit (HOSPITAL_COMMUNITY): Payer: Self-pay | Admitting: Pulmonary Disease

## 2012-07-02 ENCOUNTER — Ambulatory Visit (HOSPITAL_COMMUNITY)
Admission: RE | Admit: 2012-07-02 | Discharge: 2012-07-02 | Disposition: A | Discharge status: Home / Self Care | Payer: Medicare Other | Source: Ambulatory Visit | Attending: Pulmonary Disease | Admitting: Pulmonary Disease

## 2012-07-02 DIAGNOSIS — J3489 Other specified disorders of nose and nasal sinuses: Secondary | ICD-10-CM | POA: Insufficient documentation

## 2012-07-02 DIAGNOSIS — R059 Cough, unspecified: Secondary | ICD-10-CM | POA: Insufficient documentation

## 2012-07-02 DIAGNOSIS — R05 Cough: Secondary | ICD-10-CM

## 2012-07-02 DIAGNOSIS — R0989 Other specified symptoms and signs involving the circulatory and respiratory systems: Secondary | ICD-10-CM

## 2012-07-07 ENCOUNTER — Other Ambulatory Visit: Payer: Self-pay | Admitting: Internal Medicine

## 2012-07-07 DIAGNOSIS — R911 Solitary pulmonary nodule: Secondary | ICD-10-CM

## 2012-07-10 ENCOUNTER — Other Ambulatory Visit: Payer: Self-pay

## 2012-07-10 ENCOUNTER — Other Ambulatory Visit: Payer: Self-pay | Admitting: Urgent Care

## 2012-07-10 ENCOUNTER — Telehealth: Payer: Self-pay

## 2012-07-10 DIAGNOSIS — Z139 Encounter for screening, unspecified: Secondary | ICD-10-CM

## 2012-07-10 NOTE — Telephone Encounter (Signed)
Lab order done and faxed to lab.

## 2012-07-10 NOTE — Telephone Encounter (Signed)
Message copied by Myra Rude on Thu Jul 10, 2012  1:59 PM ------      Message from: Ervin Knack A      Created: Thu Jul 10, 2012 11:50 AM      Regarding: CREAT       Patient needs a creat faxed to Lab for CT on 12/11 Thanks!!

## 2012-07-14 ENCOUNTER — Ambulatory Visit (INDEPENDENT_AMBULATORY_CARE_PROVIDER_SITE_OTHER): Payer: Medicare Other | Admitting: Gastroenterology

## 2012-07-14 ENCOUNTER — Encounter: Payer: Self-pay | Admitting: Gastroenterology

## 2012-07-14 VITALS — BP 154/90 | HR 80 | Temp 97.6°F | Ht 71.0 in | Wt 179.0 lb

## 2012-07-14 DIAGNOSIS — R1310 Dysphagia, unspecified: Secondary | ICD-10-CM

## 2012-07-14 MED ORDER — DEXLANSOPRAZOLE 60 MG PO CPDR: 60.0000 mg | DELAYED_RELEASE_CAPSULE | Freq: Every day | ORAL | Status: DC

## 2012-07-14 NOTE — Progress Notes (Signed)
Referring Provider: Fredirick Maudlin, MD Primary Care Physician:  Fredirick Maudlin, MD Primary GI: Dr. Jena Gauss   Chief Complaint  Patient presents with  . Dysphagia    HPI:   51 year old male presenting in follow-up today, last seen June 2013. Hx of abdominal pain, unintentional wt loss. Wt stable from June. However, down a total of 17 lbs since Jan 2013 overall. Last TCS/EGD in Jan 2013 with erosive reflux esophagitis, tubular adenomas.  Dysphagia X 1 week with solid food. No pill dysphagia or liquids. No odynophagia. About 1.5 months ago noted regurgitating. States would eat dinner then about 15 minutes later sounded like a water hose in the bathroom. No changes in appetite. No rectal bleeding, no abdominal pain.  GERD controlled. Diabetes not adequately controlled. Does not see Dr. Fransico Him anymore. Managed by Dr. Juanetta Gosling.   Recent CT without etiology for wt loss. TSH normal. Lung nodule noted on CT, with recent CT for follow-up noting resolved.   Past Medical History  Diagnosis Date  . Esophageal erosions 05/24/01    egd by Dr. Jena Gauss  . Diabetes mellitus     type 2  . Hypercholesteremia   . Back pain   . COPD (chronic obstructive pulmonary disease)   . Hyperlipidemia   . GERD (gastroesophageal reflux disease)   . H/O hiatal hernia   . Arthritis   . Tubular adenoma of colon 08/28/11  . Unintentional weight loss   . Tobacco abuse     Past Surgical History  Procedure Date  . Esophagogastroduodenoscopy 05/24/01    by Dr. Jena Gauss  . Back surgery     x3  . Shoulder surgery     right  . Pelvic abcess drainage   . Anterior cervical decomp/discectomy fusion 12/19/2011    Procedure: ANTERIOR CERVICAL DECOMPRESSION/DISCECTOMY FUSION 2 LEVELS;  Surgeon: Karn Cassis, MD;  Location: [facility];  Service: Neurosurgery;  Laterality: N/A;  Cervical four-five Cervical five-six  Anterior cervical decompression/diskectomy, fusion, plate  . Esophagogastroduodenoscopy 08/28/11   Rourk-erosive reflux esophagitis, gastric erosions  . Colonoscopy w/ polypectomy 08/28/11    Rourk-tubular adenomas removed from ascending colon, suboptimal prep, diminutive rectal polyps    Current Outpatient Prescriptions  Medication Sig Dispense Refill  . cefUROXime (CEFTIN) 500 MG tablet Take 500 mg by mouth 2 (two) times daily.       . CYMBALTA 60 MG capsule Take 60 mg by mouth daily.       Marland Kitchen dexlansoprazole (DEXILANT) 60 MG capsule Take 1 capsule (60 mg total) by mouth daily.  30 capsule  11  . metFORMIN (GLUCOPHAGE) 1000 MG tablet Take 1,000 mg by mouth 2 (two) times daily.      Marland Kitchen NOVOLOG FLEXPEN 100 UNIT/ML injection Inject 40 Units into the skin. 40 units at night 10 units at night      . simvastatin (ZOCOR) 40 MG tablet Take 40 mg by mouth at bedtime.       . Tamsulosin HCl (FLOMAX) 0.4 MG CAPS Take 0.4 mg by mouth daily.       . [DISCONTINUED] dexlansoprazole (DEXILANT) 60 MG capsule Take 1 capsule (60 mg total) by mouth daily.  30 capsule  3  . diazepam (VALIUM) 5 MG tablet Take 5 mg by mouth every 6 (six) hours as needed. For anxiety/muscle relaxant      . oxyCODONE-acetaminophen (TYLOX) 5-500 MG per capsule Take 1 capsule by mouth 4 (four) times daily.        Allergies as of 07/14/2012  . (  No Known Allergies)    Family History  Problem Relation Age of Onset  . Anesthesia problems Neg Hx   . Colon cancer Neg Hx     History   Social History  . Marital Status: Single    Spouse Name: N/A    Number of Children: 1  . Years of Education: N/A   Occupational History  . disabled    Social History Main Topics  . Smoking status: Current Every Day Smoker -- 1.5 packs/day for 35 years    Types: Cigarettes  . Smokeless tobacco: None     Comment: Smokes one and a half packs of ciggarettes daily  . Alcohol Use: Yes     Comment: occasional, 12 pk beer per wk   . Drug Use: No  . Sexually Active: None   Other Topics Concern  . None   Social History Narrative   Lives w/  girlfriend    Review of Systems: Gen: SEE HPI CV: Denies chest pain, palpitations, syncope, peripheral edema, and claudication. Resp: Denies dyspnea at rest, cough, wheezing, coughing up blood, and pleurisy. GI: SEE HPI Derm: Denies rash, itching, dry skin Psych: Denies depression, anxiety, memory loss, confusion. No homicidal or suicidal ideation.  Heme: Denies bruising, bleeding, and enlarged lymph nodes.  Physical Exam: BP 154/90  Pulse 80  Temp 97.6 F (36.4 C) (Temporal)  Ht 5\' 11"  (1.803 m)  Wt 179 lb (81.194 kg)  BMI 24.97 kg/m2 General:   Alert and oriented. No distress noted. Pleasant and cooperative.  Head:  Normocephalic and atraumatic. Eyes:  Conjuctiva clear without scleral icterus. Mouth:  Oral mucosa pink and moist. Good dentition. No lesions. Heart:  S1, S2 present without murmurs, rubs, or gallops. Regular rate and rhythm. Abdomen:  +BS, soft, non-tender and non-distended. No rebound or guarding. No HSM or masses noted. Msk:  Symmetrical without gross deformities. Normal posture. Extremities:  Without edema. Neurologic:  Alert and  oriented x4;  grossly normal neurologically. Skin:  Intact without significant lesions or rashes. Psych:  Alert and cooperative. Normal mood and affect.

## 2012-07-14 NOTE — Patient Instructions (Addendum)
We have provided samples of Dexilant. I'd like you to take Dexilant twice a day for 14 days, then resume once per day.  I have scheduled an xray study of your esophagus. I'd like to see how things look first before scheduling an upper endoscopy.   Further recommendations in the near future.

## 2012-07-16 ENCOUNTER — Ambulatory Visit (HOSPITAL_COMMUNITY)
Admission: RE | Admit: 2012-07-16 | Discharge: 2012-07-16 | Disposition: A | Discharge status: Home / Self Care | Payer: Medicare Other | Source: Ambulatory Visit | Attending: Internal Medicine | Admitting: Internal Medicine

## 2012-07-16 ENCOUNTER — Ambulatory Visit (HOSPITAL_COMMUNITY)
Admission: RE | Admit: 2012-07-16 | Discharge: 2012-07-16 | Disposition: A | Discharge status: Home / Self Care | Payer: Medicare Other | Source: Ambulatory Visit | Attending: Gastroenterology | Admitting: Gastroenterology

## 2012-07-16 DIAGNOSIS — R1310 Dysphagia, unspecified: Secondary | ICD-10-CM | POA: Insufficient documentation

## 2012-07-16 DIAGNOSIS — R911 Solitary pulmonary nodule: Secondary | ICD-10-CM

## 2012-07-16 DIAGNOSIS — I2510 Atherosclerotic heart disease of native coronary artery without angina pectoris: Secondary | ICD-10-CM | POA: Insufficient documentation

## 2012-07-16 MED ORDER — IOHEXOL 300 MG/ML  SOLN
80.0000 mL | Freq: Once | INTRAMUSCULAR | Status: AC | PRN
  Administered 2012-07-16: 80 mL via INTRAVENOUS

## 2012-07-20 DIAGNOSIS — R1310 Dysphagia, unspecified: Secondary | ICD-10-CM | POA: Insufficient documentation

## 2012-07-20 NOTE — Assessment & Plan Note (Signed)
New onset. Last EGD in Jan 2013 with erosive esophagitis. UGI/BPE to assess for any structural etiology. Query dysphagia possibly r/t uncontrolled GERD. Increase Dexilant to BID X 14 days, then resume once daily. Also notes regurgitation of food, vomiting after meals at times. ?underlying diabetic gastroparesis? May ultimately need repeat EGD with dilation as appropriate. Further recommendations after BPE.

## 2012-07-21 ENCOUNTER — Telehealth: Payer: Self-pay

## 2012-07-21 NOTE — Telephone Encounter (Signed)
Pt called back- ( I had called him today and gave him his ct results) he wanted to let AS know that he continues to loose weight and is getting concerned about it.

## 2012-07-21 NOTE — Progress Notes (Signed)
Quick Note:  Tried to call pt- LMOM ______ 

## 2012-07-21 NOTE — Progress Notes (Signed)
Quick Note:  Pt aware, he stated he has no prior hx of CAD. He stated he will call pcp to make appt.  Dawn, please cc pcp. ______

## 2012-07-21 NOTE — Progress Notes (Signed)
Results faxed to PCP 

## 2012-07-21 NOTE — Progress Notes (Signed)
Faxed to PCP

## 2012-08-07 ENCOUNTER — Telehealth: Payer: Self-pay | Admitting: Internal Medicine

## 2012-08-07 NOTE — Progress Notes (Signed)
Quick Note:  Normal UGI/BPE.  No difficulties with barium tablet.   Is pt still regurgitating food? If so, needs EGD/ED.  May need GES in future as well. ______

## 2012-08-07 NOTE — Telephone Encounter (Signed)
Normal UGI/BPE.  No difficulties with barium tablet.   Is pt still regurgitating food? If so, needs EGD/ED.  May need GES in future as well.

## 2012-08-07 NOTE — Telephone Encounter (Signed)
Pt was returning call to [facility]. I told him that she wasn't available at the moment and would have her call him back at [phone]

## 2012-08-11 NOTE — Progress Notes (Signed)
Quick Note:  Pt is aware. Pt stated he was doing good right now and he will call back if he has any more problems. ______

## 2012-08-12 ENCOUNTER — Other Ambulatory Visit: Payer: Self-pay | Admitting: Internal Medicine

## 2012-08-13 NOTE — Telephone Encounter (Signed)
Documented separately in epic: Pt doing better.    Let's offer visit for February/March. Darl Pikes, please have pt return in Feb/March.

## 2012-08-19 ENCOUNTER — Encounter (HOSPITAL_COMMUNITY)
Admission: RE | Disposition: A | Discharge status: Home / Self Care | Payer: Self-pay | Source: Ambulatory Visit | Attending: Internal Medicine

## 2012-08-19 ENCOUNTER — Encounter (HOSPITAL_COMMUNITY): Payer: Medicare Other

## 2012-08-19 ENCOUNTER — Ambulatory Visit (HOSPITAL_COMMUNITY)
Admission: RE | Admit: 2012-08-19 | Discharge: 2012-08-19 | Disposition: A | Discharge status: Home / Self Care | Payer: Medicare Other | Source: Ambulatory Visit | Attending: Internal Medicine | Admitting: Internal Medicine

## 2012-08-19 ENCOUNTER — Encounter (HOSPITAL_COMMUNITY): Payer: Self-pay | Admitting: Cardiothoracic Surgery

## 2012-08-19 ENCOUNTER — Other Ambulatory Visit: Payer: Self-pay | Admitting: *Deleted

## 2012-08-19 ENCOUNTER — Encounter: Payer: Self-pay | Admitting: Gastroenterology

## 2012-08-19 ENCOUNTER — Ambulatory Visit (HOSPITAL_COMMUNITY)
Admission: RE | Admit: 2012-08-19 | Discharge: 2012-08-19 | Disposition: A | Discharge status: Home / Self Care | Payer: Medicare Other | Source: Ambulatory Visit | Attending: Cardiothoracic Surgery | Admitting: Cardiothoracic Surgery

## 2012-08-19 ENCOUNTER — Encounter (HOSPITAL_COMMUNITY): Payer: Self-pay | Admitting: Pharmacy Technician

## 2012-08-19 DIAGNOSIS — R0609 Other forms of dyspnea: Secondary | ICD-10-CM | POA: Insufficient documentation

## 2012-08-19 DIAGNOSIS — J449 Chronic obstructive pulmonary disease, unspecified: Secondary | ICD-10-CM | POA: Insufficient documentation

## 2012-08-19 DIAGNOSIS — R0989 Other specified symptoms and signs involving the circulatory and respiratory systems: Secondary | ICD-10-CM | POA: Insufficient documentation

## 2012-08-19 DIAGNOSIS — I2510 Atherosclerotic heart disease of native coronary artery without angina pectoris: Secondary | ICD-10-CM

## 2012-08-19 DIAGNOSIS — R5381 Other malaise: Secondary | ICD-10-CM | POA: Insufficient documentation

## 2012-08-19 DIAGNOSIS — E119 Type 2 diabetes mellitus without complications: Secondary | ICD-10-CM | POA: Insufficient documentation

## 2012-08-19 DIAGNOSIS — J4489 Other specified chronic obstructive pulmonary disease: Secondary | ICD-10-CM | POA: Insufficient documentation

## 2012-08-19 DIAGNOSIS — I10 Essential (primary) hypertension: Secondary | ICD-10-CM | POA: Insufficient documentation

## 2012-08-19 DIAGNOSIS — E785 Hyperlipidemia, unspecified: Secondary | ICD-10-CM | POA: Insufficient documentation

## 2012-08-19 DIAGNOSIS — Z794 Long term (current) use of insulin: Secondary | ICD-10-CM | POA: Insufficient documentation

## 2012-08-19 DIAGNOSIS — F17200 Nicotine dependence, unspecified, uncomplicated: Secondary | ICD-10-CM | POA: Insufficient documentation

## 2012-08-19 DIAGNOSIS — N492 Inflammatory disorders of scrotum: Secondary | ICD-10-CM | POA: Insufficient documentation

## 2012-08-19 HISTORY — PX: CARDIAC CATHETERIZATION: SHX172

## 2012-08-19 HISTORY — PX: LEFT HEART CATHETERIZATION WITH CORONARY ANGIOGRAM: SHX5451

## 2012-08-19 LAB — SURGICAL PCR SCREEN
MRSA, PCR: NEGATIVE
Staphylococcus aureus: NEGATIVE

## 2012-08-19 LAB — PULMONARY FUNCTION TEST

## 2012-08-19 LAB — GLUCOSE, CAPILLARY
Glucose-Capillary: 252 mg/dL — ABNORMAL HIGH (ref 70–99)
Glucose-Capillary: 215 mg/dL — ABNORMAL HIGH (ref 70–99)

## 2012-08-19 SURGERY — LEFT HEART CATHETERIZATION WITH CORONARY ANGIOGRAM
Anesthesia: LOCAL

## 2012-08-19 MED ORDER — IPRATROPIUM-ALBUTEROL 18-103 MCG/ACT IN AERO: 2.0000 | INHALATION_SPRAY | RESPIRATORY_TRACT | Status: DC

## 2012-08-19 MED ORDER — ACETAMINOPHEN 325 MG PO TABS: 650.0000 mg | ORAL_TABLET | ORAL | Status: DC | PRN

## 2012-08-19 MED ORDER — SODIUM CHLORIDE 0.9 % IV SOLN: 1.0000 mL/kg/h | INTRAVENOUS | Status: AC

## 2012-08-19 MED ORDER — OXYCODONE-ACETAMINOPHEN 5-325 MG PO TABS: 1.0000 | ORAL_TABLET | ORAL | Status: DC | PRN

## 2012-08-19 MED ORDER — ALBUTEROL SULFATE (5 MG/ML) 0.5% IN NEBU
2.5000 mg | INHALATION_SOLUTION | Freq: Once | RESPIRATORY_TRACT | Status: AC
  Administered 2012-08-19: 2.5 mg via RESPIRATORY_TRACT

## 2012-08-19 MED ORDER — MIDAZOLAM HCL 2 MG/2ML IJ SOLN
INTRAMUSCULAR | Status: AC
  Filled 2012-08-19: qty 2

## 2012-08-19 MED ORDER — HYDRALAZINE HCL 20 MG/ML IJ SOLN: 10.0000 mg | INTRAMUSCULAR | Status: DC | PRN

## 2012-08-19 MED ORDER — NITROGLYCERIN 0.2 MG/ML ON CALL CATH LAB
INTRAVENOUS | Status: AC
  Filled 2012-08-19: qty 1

## 2012-08-19 MED ORDER — ONDANSETRON HCL 4 MG/2ML IJ SOLN: 4.0000 mg | Freq: Four times a day (QID) | INTRAMUSCULAR | Status: DC | PRN

## 2012-08-19 MED ORDER — SODIUM CHLORIDE 0.9 % IJ SOLN: 3.0000 mL | INTRAMUSCULAR | Status: DC | PRN

## 2012-08-19 MED ORDER — MUPIROCIN 2 % EX OINT: TOPICAL_OINTMENT | Freq: Two times a day (BID) | CUTANEOUS | Status: DC

## 2012-08-19 MED ORDER — MUPIROCIN 2 % EX OINT
TOPICAL_OINTMENT | CUTANEOUS | Status: AC
  Filled 2012-08-19: qty 22

## 2012-08-19 MED ORDER — SODIUM CHLORIDE 0.9 % IV SOLN
INTRAVENOUS | Status: DC
  Administered 2012-08-19: 09:00:00 via INTRAVENOUS

## 2012-08-19 MED ORDER — LIDOCAINE HCL (PF) 1 % IJ SOLN
INTRAMUSCULAR | Status: AC
  Filled 2012-08-19: qty 30

## 2012-08-19 MED ORDER — HEPARIN (PORCINE) IN NACL 2-0.9 UNIT/ML-% IJ SOLN
INTRAMUSCULAR | Status: AC
  Filled 2012-08-19: qty 1000

## 2012-08-19 MED ORDER — FENTANYL CITRATE 0.05 MG/ML IJ SOLN
INTRAMUSCULAR | Status: AC
  Filled 2012-08-19: qty 2

## 2012-08-19 MED ORDER — SODIUM CHLORIDE 0.9 % IV SOLN: 250.0000 mL | INTRAVENOUS | Status: DC | PRN

## 2012-08-19 MED ORDER — ASPIRIN 81 MG PO CHEW
324.0000 mg | CHEWABLE_TABLET | ORAL | Status: AC
  Administered 2012-08-19: 324 mg via ORAL
  Filled 2012-08-19: qty 4

## 2012-08-19 MED ORDER — SODIUM CHLORIDE 0.9 % IJ SOLN: 3.0000 mL | Freq: Two times a day (BID) | INTRAMUSCULAR | Status: DC

## 2012-08-19 NOTE — CV Procedure (Signed)
[facility]     CARDIAC CATHETERIZATION REPORT  [name]   [id number] [birth year]  Performing Cardiologist: Chrystie Nose Primary Physician: Fredirick Maudlin, MD Primary Cardiologist: Dr. Rennis Golden  Procedures Performed:  Left Heart Catheterization via 5 Fr right femoral artery access  Left Ventriculography, (RAO/LAO) 15 ml/sec for 30 ml total contrast  Native Coronary Angiography  Pedicle LIMA angiography   Indication(s): Fatigue, dyspnea, multivessel calcified CAD on CT scan  History: 52 y.o. male with a history of IDDM, HTN, HPL, long-standing tobacco abuse and recurrent scrotal abscess, presented for evaluation of CAD after a chest CT showed calcified LM and 3V CAD with aortic atherosclerosis. He denies angina, but has had increasing fatigue, dyspnea and low energy level. Due to concern about balanced ischemia with nuclear stress testing, he was referred directly for cardiac catheterization.  Consent: The procedure with Risks/Benefits/Alternatives and Indications was reviewed with the patient (and family).  All questions were answered.    Risks / Complications include, but not limited to: Death, MI, CVA/TIA, VF/VT (with defibrillation), Bradycardia (need for temporary pacer placement), contrast induced nephropathy, bleeding / bruising / hematoma / pseudoaneurysm, vascular or coronary injury (with possible emergent CT or Vascular Surgery), adverse medication reactions, infection.    The patient (and family) voice understanding and agree to proceed.    Risks of procedure as well as the alternatives and risks of each were explained to the (patient/caregiver).  Consent for procedure obtained. Consent for signed by MD and patient with RN witness -- placed on chart.  Procedure: The patient was brought to the 2nd Floor [facility] in the fasting state and prepped and draped in the usual sterile fashion for (Right groin or radial)  access.  A modified Allen's test with plethysmography was performed on the right wrist demonstrating adequate Ulnar Artery collateral flow.    Sterile technique was used including antiseptics, cap, gloves, gown, hand hygiene, mask and sheet.  Skin prep: Chlorhexidine;  Time Out: Verified patient identification, verified procedure, site/side was marked, verified correct patient position, special equipment/implants available, medications/allergies/relevent history reviewed, required imaging and test results available.  Performed  An attempt was made to cannulate the right radial artery, however, unsuccessfully. Attention was then turned to the femoral artery. The right femoral head was identified using tactile and fluoroscopic technique.  The right groin was anesthetized with 1% subcutaneous Lidocaine.  The right Common Femoral Artery was accessed using the Modified Seldinger Technique with placement of (5 Fr) sheath using the Seldinger technique.  The sheath was aspirated and flushed.  A 5 Fr JL4 Catheter was advanced of over a Standard J wire into the ascending Aorta.  The catheter was used to engage the left coronary artery.  Multiple cineangiographic views of the left coronary artery system(s) were performed. A 5 Fr JR4 Catheter was advanced of over a Safety J wire into the ascending Aorta.  The catheter was used to engage the right coronary artery and a non-selective shot of the native LIMA was obtained.  Multiple cineangiographic views of the right coronary artery system(s) were performed. This catheter was then exchanged over the Standard J wire for an angled Pigtail catheter that was advanced across the Aortic Valve.  LV hemodynamics were measured (and) Left Ventriculography was performed.  LV hemodynamics were then re-sampled, and the catheter was pulled back across the Aortic Valve for measurement of "pull-back" gradient.  The catheter and the wire was removed completely out of the body.  The  patient was transferred to the holding area where the sheath was removed with manual pressure held for hemostasis.   The patient was transported to the cath lab holding area in stable condition.   The patient  was stable before, during and following the procedure.   Patient did tolerate procedure well. There were not complications. EBL: <10 cc  Medications:  Premedication: none  Sedation: 3 mg IV Versed, 50 IV mcg Fentanyl  Contrast:  100 cc Omnipaque  Hemodynamics:  Central Aortic Pressure / Mean Aortic Pressure: 153/82   LV Pressure / LV End diastolic Pressure:  15  Left Ventriculography:  EF:  60-65%  Wall Motion: normal  Coronary Angiographic Data:  Left Main:  4.0 vessel, tapers to 2.0 distally with heavy eccentric calcification (50% stenosis)  Left Anterior Descending (LAD):  There is a tubular 50-60% proximal LAD stenosis, however, the distal vessel is preserved and reaches around the apex.  1st diagonal (D1):  Moderate sized vessel with 80% ostial stenosis.  Circumflex (LCx):  There is 80% calcified ostial stenosis of the LCX - the vessel appears to abruptly occluded in the mid-distal segment, with numerous OM vessels and collaterals thereafter.  Ramus Intermedius:  True ramus vessel with moderate proximal disease  Right Coronary Artery: Dominant vessel. Heavily calcified throughout. There is a proximal 80% stenosis and a distal 90% stenosis prior to the genu.  posterior descending artery: extensive distribution, 2 large branches, mild to moderate ostial disease of the first branch.  posterior lateral branch:  No significant stenoses.  LIMA - Non-selective visualization of the LIMA demonstrates it may be suitable for bypass.  Impression: 1.  Calcified 50% distal LM stenosis. 2.  Severe ostial LCx stenosis with mid to distal vessel occlusion. 3.  Severe Proximal and distal RCA stenoses which are heavily calcified. 4.  Tubular moderate proximal LAD disease. 5.   Normal LV function.  Plan: 1.  Multivessel and LM disease, heavily calcified in a diabetic.  CABG evaluation is warranted. 2.  Asked TCTS to evaluate - probably okay for discharge, but would be good to revascularize sooner than later.  The case and results was discussed with the patient (and family). The case and results was not discussed with the patient's PCP. The case and results was discussed with the patient's Cardiologist.  Time Spend Directly with Patient:  45 minutes  Chrystie Nose, MD, [facility] Attending Cardiologist [facility]  Saintclair Schroader C 08/19/2012, 12:18 PM

## 2012-08-19 NOTE — Telephone Encounter (Signed)
Pt is aware of OV on 3/3 at 0800 with AS and appt card was mailed

## 2012-08-19 NOTE — H&P (Signed)
     [facility]          INTERVAL PROCEDURE H&P   History and Physical Interval Note:  08/19/2012 10:04 AM  Fatigue, dyspnea, 3V calcified CAD by CT, including LM, IDDM, HTN, HPL. Plan radial approach.  [name] has presented today for their planned procedure. The various methods of treatment have been discussed with the patient and family. After consideration of risks, benefits and other options for treatment, the patient has consented to the procedure.  The patients' outpatient history has been reviewed, patient examined, and no change in status from most recent office note within the past 30 days. I have reviewed the patients' chart and labs and will proceed as planned. Questions were answered to the patient's satisfaction.   Chrystie Nose, MD, [facility] Attending Cardiologist [facility]  HILTY,Kenneth C 08/19/2012, 10:04 AM

## 2012-08-19 NOTE — Consult Note (Signed)
[address]            [place] [address]          [phone]      [facility] Medical Record #[id number] Date of Birth: 03/02/1961  Referring: Dr Rennis Golden Primary Care: Fredirick Maudlin, MD  Chief Complaint:   Abnormal calcium on ct scan of chest   History of Present Illness:    Patient is a 52 year old disabled male with long-standing tobacco abuse and poorly controlled diabetes. He notes that 2 years ago a lung nodule was noted on a CT scan of the chest. Recently he returned for followup CT to evaluate the nodule, the nodule had disappeared but was commented that he had significant calcification of the left main coronary vessels. He is referred to cardiology and cardiac catheterization was performed today. The patient has no definite history of myocardial infarction. He does have known underlying COPD, shortness of breath with exertion and fatigue. He denies definite angina.       Current Activity/ Functional Status: Patient is independent with mobility/ambulation, transfers, ADL's, IADL's.   Past Medical History  Diagnosis Date  . Esophageal erosions 05/24/01    egd by Dr. Jena Gauss  . Diabetes mellitus     type 2 poorly controlled for 5 years HGBA1C  Reported as 11  . Hypercholesteremia   . Back pain   . COPD (chronic obstructive pulmonary disease) pft' pending   . Hyperlipidemia   . GERD (gastroesophageal reflux disease)   . H/O hiatal hernia   . Arthritis   . Tubular adenoma of colon 08/28/11  . Unintentional weight loss   . Tobacco abuse     Past Surgical History  Procedure Date  . Esophagogastroduodenoscopy 05/24/01    by Dr. Jena Gauss  . Back surgery     x3  . Shoulder surgery     right  . Pelvic abcess drainage   . Anterior cervical decomp/discectomy fusion 12/19/2011    Procedure: ANTERIOR CERVICAL DECOMPRESSION/DISCECTOMY FUSION 2 LEVELS;  Surgeon: Karn Cassis, MD;  Location: [facility];  Service:  Neurosurgery;  Laterality: N/A;  Cervical four-five Cervical five-six  Anterior cervical decompression/diskectomy, fusion, plate  . Esophagogastroduodenoscopy 08/28/11    Rourk-erosive reflux esophagitis, gastric erosions  . Colonoscopy w/ polypectomy 08/28/11    Rourk-tubular adenomas removed from ascending colon, suboptimal prep, diminutive rectal polyps    Family History  Problem Relation Age of Onset  . Anesthesia problems Neg Hx   . Colon cancer Neg Hx     History   Social History  . Marital Status: Single    Spouse Name: N/A    Number of Children: 1  . Years of Education: N/A   Occupational History  . disabled  due to back problems    Social History Main Topics  . Smoking status: Current Every Day Smoker -- 1.5 packs/day for 35 years    Types: Cigarettes  . Smokeless tobacco: Not on file     Comment: Smokes one and a half packs of ciggarettes daily  . Alcohol Use: Yes     Comment: occasional, 12 pk beer per wk   . Drug Use: No  .      Social History Narrative   Lives w/ girlfriend    History  Smoking status  . Current Every Day Smoker -- 1.5 packs/day for 35 years  .  Types: Cigarettes  Smokeless tobacco  . Not on file    Comment: Smokes one and a half packs of ciggarettes daily    History  Alcohol Use  . Yes    Comment: occasional, 12 pk beer per wk      No Known Allergies  Current Facility-Administered Medications  Medication Dose Route Frequency Provider Last Rate Last Dose  . 0.9 %  sodium chloride infusion  250 mL Intravenous PRN Chrystie Nose, MD      . 0.9 %  sodium chloride infusion   Intravenous Continuous Chrystie Nose, MD 75 mL/hr at 08/19/12 [phone]    . 0.9 %  sodium chloride infusion  1 mL/kg/hr Intravenous Continuous Chrystie Nose, MD 77.6 mL/hr at 08/19/12 1331 1 mL/kg/hr at 08/19/12 1331  . acetaminophen (TYLENOL) tablet 650 mg  650 mg Oral Q4H PRN Chrystie Nose, MD      . hydrALAZINE (APRESOLINE) injection 10 mg  10 mg  Intravenous Q2H PRN Chrystie Nose, MD      . ondansetron [place]) injection 4 mg  4 mg Intravenous Q6H PRN Chrystie Nose, MD      . oxyCODONE-acetaminophen (PERCOCET/ROXICET) 5-325 MG per tablet 1-2 tablet  1-2 tablet Oral Q4H PRN Chrystie Nose, MD      . sodium chloride 0.9 % injection 3 mL  3 mL Intravenous Q12H Chrystie Nose, MD      . sodium chloride 0.9 % injection 3 mL  3 mL Intravenous PRN Chrystie Nose, MD       Medications Prior to Admission  Medication Sig Dispense Refill  . CYMBALTA 60 MG capsule Take 60 mg by mouth daily.       Marland Kitchen dexlansoprazole (DEXILANT) 60 MG capsule Take 1 capsule (60 mg total) by mouth daily.  30 capsule  11  . metFORMIN (GLUCOPHAGE) 1000 MG tablet Take 1,000 mg by mouth 2 (two) times daily.      Marland Kitchen NOVOLOG FLEXPEN 100 UNIT/ML injection Inject 40 Units into the skin at bedtime.       Marland Kitchen oxyCODONE-acetaminophen (PERCOCET/ROXICET) 5-325 MG per tablet Take 1 tablet by mouth every 4 (four) hours as needed. For pain      . simvastatin (ZOCOR) 40 MG tablet Take 40 mg by mouth at bedtime.       . Tamsulosin HCl (FLOMAX) 0.4 MG CAPS Take 0.4 mg by mouth daily.           Review of Systems:     Cardiac Review of Systems: Y or N  Chest Pain [  n  ]  Resting SOB [  n ] Exertional SOB  [ y ]  Pollyann Kennedy Milo.Brash  ]   Pedal Edema [ n  ]    Palpitations [ n ] Syncope  [ n ]   Presyncope [n   ]  General Review of Systems: [Y] = yes [  ]=no Constitional: recent weight change [ loss of wt after neck surgery ]; anorexia [  ]; fatigue [ y ]; nausea [  ]; night sweats [  ]; fever [n  ]; or chills [ n ];  Dental: poor dentition[ y ]; Last Dentist visit: years  Eye : blurred vision [  ]; diplopia [   ]; vision changes [n  ];  Amaurosis fugax[n  ]; Resp: cough [ every morning ];  wheezing[ y ];  hemoptysis[n  ]; shortness of breath[ y ];  paroxysmal nocturnal dyspnea[ y ]; dyspnea on exertion[ y ]; or orthopnea[  ];  GI:  gallstones[  ], vomiting[  ];  dysphagia[  ]; melena[  ];  hematochezia [  ]; heartburn[  ];   Hx of  Colonoscopy[  ];             GU: kidney stones [  ]; hematuria[n  ];   dysuria [  ];  nocturia[  ];  history of     obstruction [  ];history of scrotal abscess              Skin: rash, swelling[  ];, hair loss[  ];  peripheral edema[  ];  or itching[  ]; Musculosketetal: myalgias[ y ];  joint swelling[  ];  joint erythema[  ];  joint pain[  ];  back pain[  ];  Heme/Lymph: bruising[  ];  bleeding[  ];  anemia[  ];  Neuro: TIA[  n];  headaches[  ];  stroke[n  ];  vertigo[  ];  seizures[ n ];   paresthesias[  ];  difficulty walking[ y ];  Psych:depression[  ]; anxiety[  ];  Endocrine: diabetes[y  ];  thyroid dysfunction[ n ];  Immunizations: Flu [ y ]; Pneumococcal[y  ];  Other:  Physical Exam: BP 151/86  Pulse 75  Temp 97.4 F (36.3 C) (Oral)  Resp 20  Ht 5\' 11"  (1.803 m)  Wt 171 lb (77.565 kg)  BMI 23.85 kg/m2  SpO2 94%  General appearance: alert, cooperative, appears older than stated age and no distress Neurologic: intact Heart: regular rate and rhythm, S1, S2 normal, no murmur, click, rub or gallop and normal apical impulse Lungs: diminished breath sounds bibasilar and rhonchi bilaterally Abdomen: soft, non-tender; bowel sounds normal; no masses,  no organomegaly Extremities: extremities normal, atraumatic, no cyanosis or edema and Homans sign is negative, no sign of DVT no active scrotal infection, no carotid bruits no cervical or supraclavicular adenopathy.   Diagnostic Studies & Laboratory data:     Recent Radiology Findings:   RADIOLOGY REPORT*  Clinical Data: Pulmonary nodule, 46-month follow-up examination.  CT CHEST WITH CONTRAST  Technique: Multidetector CT imaging of the chest was performed  following the standard protocol during bolus administration of  intravenous contrast.    Contrast: 80mL OMNIPAQUE IOHEXOL 300 MG/ML SOLN  Comparison: Chest CT 01/24/2012.  Findings:  Mediastinum: Heart size is normal. There is no significant  pericardial fluid, thickening or pericardial calcification. There  is atherosclerosis of the thoracic aorta, the great vessels of the  mediastinum and the coronary arteries, including calcified  atherosclerotic plaque in the left main, left anterior descending,  left circumflex and right coronary arteries. No pathologically  enlarged mediastinal or hilar lymph nodes. Esophagus is  unremarkable in appearance. Incidental note is made of the  separate origin of the left vertebral artery directly off the  aortic arch (normal anatomical variant).  Lungs/Pleura: Previously noted 5 mm nodule in the inferior segment  of the lingula is no longer identified. There is a focal area of  scarring in the inferior segment of the lingula. There is a 4 mm  nodule in the periphery of the right lower  lobe (image 42 of series  3) that is unchanged in retrospect compared to remote prior  examination 09/26/2009, and can be considered radiographically  benign requiring no imaging follow-up. No other larger more  suspicious appearing pulmonary nodules or masses are otherwise  identified. No consolidative airspace disease. No pleural  effusions.  Upper Abdomen: Unremarkable.  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 There are no aggressive appearing  lytic or blastic lesions noted in the visualized portions of the  skeleton.  IMPRESSION:  1. The previously noted 5-mm lingular nodule has resolved  compatible with a benign etiology on the prior examination. There  are no suspicious appearing pulmonary nodules or masses on today's  examination.  2. Atherosclerosis, including left main and three-vessel coronary  artery disease. Please  note that although the presence of coronary  artery calcium documents the presence of coronary artery disease,  the severity of this disease and any potential stenosis cannot be  assessed on this non-gated CT examination. Assessment for  potential risk factor modification, dietary therapy or  pharmacologic therapy may be warranted, if clinically indicated.  Original Report Authenticated By: Trudie Reed, M.D.   CATH:  THEORDORE CISNERO [id number]  November 15, 1960  Performing Cardiologist: Chrystie Nose  Primary Physician: Fredirick Maudlin, MD  Primary Cardiologist: Dr. Rennis Golden  Procedures Performed:  Left Heart Catheterization via 5 Fr right femoral artery access  Left Ventriculography, (RAO/LAO) 15 ml/sec for 30 ml total contrast  Native Coronary Angiography  Pedicle LIMA angiography  Indication(s): Fatigue, dyspnea, multivessel calcified CAD on CT scan  History: 52 y.o. male with a history of IDDM, HTN, HPL, long-standing tobacco abuse and recurrent scrotal abscess, presented for evaluation of CAD after a chest CT showed calcified LM and 3V CAD with aortic atherosclerosis. He denies angina, but has had increasing fatigue, dyspnea and low energy level. Due to concern about balanced ischemia with nuclear stress testing, he was referred directly for cardiac catheterization.  Consent: The procedure with Risks/Benefits/Alternatives and Indications was reviewed with the patient (and family). All questions were answered.  Risks / Complications include, but not limited to: Death, MI, CVA/TIA, VF/VT (with defibrillation), Bradycardia (need for temporary pacer placement), contrast induced nephropathy, bleeding / bruising / hematoma / pseudoaneurysm, vascular or coronary injury (with possible emergent CT or Vascular Surgery), adverse medication reactions, infection.  The patient (and family) voice understanding and agree to proceed.  Risks of procedure as well as the alternatives and risks of each were  explained to the (patient/caregiver). Consent for procedure obtained.  Consent for signed by MD and patient with RN witness -- placed on chart.  Procedure: The patient was brought to the 2nd Floor [facility] in the fasting state and prepped and draped in the usual sterile fashion for (Right groin or radial) access. A modified Allen's test with plethysmography was performed on the right wrist demonstrating adequate Ulnar Artery collateral flow.  Sterile technique was used including antiseptics, cap, gloves, gown, hand hygiene, mask and sheet.  Skin prep: Chlorhexidine;  Time Out: Verified patient identification, verified procedure, site/side was marked, verified correct patient position, special equipment/implants available, medications/allergies/relevent history reviewed, required imaging and test results available. Performed  An attempt was made to cannulate the right radial artery, however, unsuccessfully. Attention was then turned to the femoral artery. The right femoral head was identified using tactile and fluoroscopic technique. The right groin was anesthetized with  1% subcutaneous Lidocaine. The right Common Femoral Artery was accessed using the Modified Seldinger Technique with placement of (5 Fr) sheath using the Seldinger technique. The sheath was aspirated and flushed. A 5 Fr JL4 Catheter was advanced of over a Standard J wire into the ascending Aorta. The catheter was used to engage the left coronary artery. Multiple cineangiographic views of the left coronary artery system(s) were performed. A 5 Fr JR4 Catheter was advanced of over a Safety J wire into the ascending Aorta. The catheter was used to engage the right coronary artery and a non-selective shot of the native LIMA was obtained. Multiple cineangiographic views of the right coronary artery system(s) were performed. This catheter was then exchanged over the Standard J wire for an angled Pigtail catheter that was  advanced across the Aortic Valve. LV hemodynamics were measured (and) Left Ventriculography was performed. LV hemodynamics were then re-sampled, and the catheter was pulled back across the Aortic Valve for measurement of "pull-back" gradient. The catheter and the wire was removed completely out of the body.  The patient was transferred to the holding area where the sheath was removed with manual pressure held for hemostasis.  The patient was transported to the cath lab holding area in stable condition.  The patient was stable before, during and following the procedure.  Patient did tolerate procedure well.  There were not complications.  EBL: <10 cc  Medications:  Premedication: none  Sedation: 3 mg IV Versed, 50 IV mcg Fentanyl  Contrast: 100 cc Omnipaque  Hemodynamics:  Central Aortic Pressure / Mean Aortic Pressure: 153/82  LV Pressure / LV End diastolic Pressure: 15  Left Ventriculography:  EF: 60-65%  Wall Motion: normal  Coronary Angiographic Data:  Left Main: 4.0 vessel, tapers to 2.0 distally with heavy eccentric calcification (50% stenosis)  Left Anterior Descending (LAD): There is a tubular 50-60% proximal LAD stenosis, however, the distal vessel is preserved and reaches around the apex.  1st diagonal (D1): Moderate sized vessel with 80% ostial stenosis.  Circumflex (LCx): There is 80% calcified ostial stenosis of the LCX - the vessel appears to abruptly occluded in the mid-distal segment, with numerous OM vessels and collaterals thereafter.  Ramus Intermedius: True ramus vessel with moderate proximal disease  Right Coronary Artery: Dominant vessel. Heavily calcified throughout. There is a proximal 80% stenosis and a distal 90% stenosis prior to the genu.  posterior descending artery: extensive distribution, 2 large branches, mild to moderate ostial disease of the first branch.  posterior lateral branch: No significant stenoses. LIMA - Non-selective visualization of the LIMA  demonstrates it may be suitable for bypass.  Impression:  1. Calcified 50% distal LM stenosis.  2. Severe ostial LCx stenosis with mid to distal vessel occlusion.  3. Severe Proximal and distal RCA stenoses which are heavily calcified.  4. Tubular moderate proximal LAD disease.  5. Normal LV function.  Plan:  1. Multivessel and LM disease, heavily calcified in a diabetic. CABG evaluation is warranted.  2. Asked TCTS to evaluate - probably okay for discharge, but would be good to revascularize sooner than later.  The case and results was discussed with the patient (and family).  The case and results was not discussed with the patient's PCP.  The case and results was discussed with the patient's Cardiologist.  Time Spend Directly with Patient:  45 minutes  Chrystie Nose, MD, [facility]  Attending Cardiologist  [facility]  HILTY,Kenneth C  08/19/2012, 12:18 PM  Recent Lab Findings: Lab Results  Component Value Date   WBC 11.4* 12/25/2011   HGB 16.2 12/25/2011   HCT 45.3 12/25/2011   PLT 175 12/25/2011   GLUCOSE 289* 12/25/2011   ALT 21 12/25/2011   AST 16 12/25/2011   NA 131* 12/25/2011   K 3.6 12/25/2011   CL 89* 12/25/2011   CREATININE 0.60 12/25/2011   BUN 17 12/25/2011   CO2 29 12/25/2011   TSH 1.762 01/17/2012   No results found for this basename: HGBA1C     Assessment / Plan:     52 year old diabetic male with long-standing tobacco abuse, alcohol abuse, and poorly controlled diabetes who presents with cardiac catheterization was significant 3 vessel coronary artery disease including left main obstruction. In addition the patient has COPD PFTs are pending Long-standing diabetes Hypertension Hyperlipidemia  I discussed with the patient the cardiac catheterization films and agree with Dr. Rennis Golden being diabetic and with 3 vessel coronary artery disease including left main obstruction coronary artery bypass grafting offers the best treatment option. I  discussed with the patient in detail the need for them to stop smoking, stop consuming alcohol which is obviously aggravating his diabetic control, and needs better control of his blood glucose, which was 289 on admission today.  Tentatively plan to proceed with coronary artery bypass grafting on January 17, the risks and options of treatment have been discussed with the patient in detail including the increased risk of infection and respiratory problems postoperatively because of his associated medical problems.    Delight Ovens MD  Beeper [phone] Office [phone] 08/19/2012 2:32 PM

## 2012-08-20 ENCOUNTER — Ambulatory Visit (HOSPITAL_COMMUNITY)
Admission: RE | Admit: 2012-08-20 | Discharge: 2012-08-20 | Disposition: A | Discharge status: Home / Self Care | Payer: Medicare Other | Source: Ambulatory Visit | Attending: Cardiothoracic Surgery | Admitting: Cardiothoracic Surgery

## 2012-08-20 ENCOUNTER — Other Ambulatory Visit (HOSPITAL_COMMUNITY): Payer: Self-pay | Admitting: Cardiothoracic Surgery

## 2012-08-20 ENCOUNTER — Encounter (HOSPITAL_COMMUNITY)
Admission: RE | Admit: 2012-08-20 | Discharge: 2012-08-20 | Disposition: A | Discharge status: Home / Self Care | Payer: Medicare Other | Source: Ambulatory Visit | Attending: Cardiothoracic Surgery | Admitting: Cardiothoracic Surgery

## 2012-08-20 ENCOUNTER — Encounter (HOSPITAL_COMMUNITY): Payer: Self-pay

## 2012-08-20 ENCOUNTER — Encounter (HOSPITAL_COMMUNITY): Payer: Medicare Other

## 2012-08-20 VITALS — BP 159/89 | HR 70 | Temp 98.6°F | Resp 20 | Ht 71.0 in | Wt 181.4 lb

## 2012-08-20 DIAGNOSIS — Z951 Presence of aortocoronary bypass graft: Secondary | ICD-10-CM

## 2012-08-20 DIAGNOSIS — Z01810 Encounter for preprocedural cardiovascular examination: Secondary | ICD-10-CM

## 2012-08-20 DIAGNOSIS — E119 Type 2 diabetes mellitus without complications: Secondary | ICD-10-CM

## 2012-08-20 DIAGNOSIS — I2510 Atherosclerotic heart disease of native coronary artery without angina pectoris: Secondary | ICD-10-CM

## 2012-08-20 LAB — PROTIME-INR
INR: 0.92 (ref 0.00–1.49)
Prothrombin Time: 12.3 seconds (ref 11.6–15.2)

## 2012-08-20 LAB — COMPREHENSIVE METABOLIC PANEL
ALT: 33 U/L (ref 0–53)
AST: 22 U/L (ref 0–37)
Albumin: 4 g/dL (ref 3.5–5.2)
Alkaline Phosphatase: 135 U/L — ABNORMAL HIGH (ref 39–117)
BUN: 15 mg/dL (ref 6–23)
CO2: 26 mEq/L (ref 19–32)
Calcium: 9.9 mg/dL (ref 8.4–10.5)
Chloride: 96 mEq/L (ref 96–112)
Creatinine, Ser: 0.62 mg/dL (ref 0.50–1.35)
GFR calc Af Amer: >90 mL/min (ref >90)
GFR calc non Af Amer: >90 mL/min (ref >90)
Glucose, Bld: 258 mg/dL — ABNORMAL HIGH (ref 70–99)
Potassium: 4.3 mEq/L (ref 3.5–5.1)
Sodium: 135 mEq/L (ref 135–145)
Total Bilirubin: 0.4 mg/dL (ref 0.3–1.2)
Total Protein: 8.1 g/dL (ref 6.0–8.3)

## 2012-08-20 LAB — HEMOGLOBIN A1C
Hgb A1c MFr Bld: 10.7 % — ABNORMAL HIGH (ref <5.7)
Mean Plasma Glucose: 260 mg/dL — ABNORMAL HIGH (ref <117)

## 2012-08-20 LAB — BLOOD GAS, ARTERIAL
Acid-Base Excess: 3.5 mmol/L — ABNORMAL HIGH (ref 0.0–2.0)
Bicarbonate: 27.5 mEq/L — ABNORMAL HIGH (ref 20.0–24.0)
FIO2: 0.21 %
O2 Saturation: 93 %
Patient temperature: 98.6
TCO2: 28.8 mmol/L (ref 0–100)
pCO2 arterial: 41.9 mmHg (ref 35.0–45.0)
pH, Arterial: 7.433 (ref 7.350–7.450)
pO2, Arterial: 60.8 mmHg — ABNORMAL LOW (ref 80.0–100.0)

## 2012-08-20 LAB — URINALYSIS, ROUTINE W REFLEX MICROSCOPIC
Bilirubin Urine: NEGATIVE
Glucose, UA: >1000 mg/dL — AB
Hgb urine dipstick: NEGATIVE
Ketones, ur: NEGATIVE mg/dL
Leukocytes, UA: NEGATIVE
Nitrite: NEGATIVE
Protein, ur: NEGATIVE mg/dL
Specific Gravity, Urine: 1.019 (ref 1.005–1.030)
Urobilinogen, UA: 0.2 mg/dL (ref 0.0–1.0)
pH: 6 (ref 5.0–8.0)

## 2012-08-20 LAB — APTT: aPTT: 28 seconds (ref 24–37)

## 2012-08-20 LAB — TYPE AND SCREEN
ABO/RH(D): O POS
Antibody Screen: NEGATIVE

## 2012-08-20 LAB — CBC
HCT: 48.9 % (ref 39.0–52.0)
Hemoglobin: 17.6 g/dL — ABNORMAL HIGH (ref 13.0–17.0)
MCH: 32.9 pg (ref 26.0–34.0)
MCHC: 36 g/dL (ref 30.0–36.0)
MCV: 91.4 fL (ref 78.0–100.0)
Platelets: 158 10*3/uL (ref 150–400)
RBC: 5.35 MIL/uL (ref 4.22–5.81)
RDW: 12 % (ref 11.5–15.5)
WBC: 10.1 10*3/uL (ref 4.0–10.5)

## 2012-08-20 LAB — URINE MICROSCOPIC-ADD ON

## 2012-08-20 NOTE — Progress Notes (Signed)
Pt said that he had PFT 08/19/12 while in [facility].

## 2012-08-20 NOTE — Pre-Procedure Instructions (Signed)
[name]  08/20/2012   Your procedure is scheduled on:  January 17th.  Report to [facility] at 5:30 AM.  Call this number if you have problems the morning of surgery: [phone]   Remember:   Do not eat food or drink liquids after midnight.     Take these medicines the morning of surgery with A SIP OF WATER: Cymbalta, Dexlansoprazole (Dexilant), Tamsulosin (Flomax).  Use inhaler.   Do not wear jewelry, m  ake-up or nail polish.  Do not wear lotions, powders, or perfumes. You may wear deodorant.  Do not shave 48 hours prior to surgery. Men may shave face and neck.  Do not bring valuables to the hospital.  Contacts, dentures or bridgework may not be worn into surgery.  Leave suitcase in the car. After surgery it may be brought to your room.  For patients admitted to the hospital, checkout time is 11:00 AM the day of  discharge.   Patients discharged the day of surgery will not be allowed to drive home.  Name and phone number of your driver: NA   Special Instructions: Shower using CHG 2 nights before surgery and the night before surgery.  If you shower the day of surgery use CHG.  Use special wash - you have one bottle of CHG for all showers.  You should use approximately 1/3 of the bottle for each shower.   Please read over the following fact sheets that you were given: Pain Booklet, Coughing and Deep Breathing, Blood Transfusion Information and Surgical Site Infection Prevention And Incentive Spirometry.

## 2012-08-20 NOTE — Progress Notes (Signed)
VASCULAR LAB PRELIMINARY  PRELIMINARY  PRELIMINARY  PRELIMINARY  Pre-op Cardiac Surgery  Carotid Findings:  Bilateral:  No evidence of hemodynamically significant internal carotid artery stenosis.   Vertebral artery flow is antegrade.     Upper Extremity Right Left  Brachial Pressures 145 Triphasic 144 Triphasic  Radial Waveforms Triphasic Triphasic  Ulnar Waveforms Triphasic Triphasic  Palmar Arch (Allen's Test) Normal Abnormal   Findings:  Doppler waveforms on the right remained normal with both radial and ulnar compressions. Left Doppler waveforms remained normal with radial compression and reversed with ulnar compression    Lower  Extremity Right Left  Dorsalis Pedis 74 Dampened Monophasic 94 Monophasic      Posterior Tibial 67 Dampened Monophasic 104 Dampened Monophasic  Ankle/Brachial Indices 0.50 0.70    Findings:  ABIs on the right indicate a moderate to severe reduction in arterial flow. Left  ABIs indicate a moderate reduction in arterial flow.   Altair Appenzeller, RVS 08/20/2012, 4:14 PM

## 2012-08-21 MED ORDER — VANCOMYCIN HCL 10 G IV SOLR
1250.0000 mg | INTRAVENOUS | Status: AC
  Administered 2012-08-22: 1250 mg via INTRAVENOUS
  Filled 2012-08-21 (×2): qty 1250

## 2012-08-21 MED ORDER — DOPAMINE-DEXTROSE 3.2-5 MG/ML-% IV SOLN
2.0000 ug/kg/min | INTRAVENOUS | Status: DC
  Filled 2012-08-21: qty 250

## 2012-08-21 MED ORDER — DEXTROSE 5 % IV SOLN
1.5000 g | INTRAVENOUS | Status: AC
  Administered 2012-08-22: 1.5 g via INTRAVENOUS
  Administered 2012-08-22: .75 g via INTRAVENOUS
  Filled 2012-08-21 (×2): qty 1.5

## 2012-08-21 MED ORDER — EPINEPHRINE HCL 1 MG/ML IJ SOLN
0.5000 ug/min | INTRAVENOUS | Status: DC
  Filled 2012-08-21: qty 4

## 2012-08-21 MED ORDER — SODIUM CHLORIDE 0.9 % IV SOLN
INTRAVENOUS | Status: AC
  Administered 2012-08-22: 14 mL/h via INTRAVENOUS
  Filled 2012-08-21: qty 40

## 2012-08-21 MED ORDER — POTASSIUM CHLORIDE 2 MEQ/ML IV SOLN
80.0000 meq | INTRAVENOUS | Status: DC
  Filled 2012-08-21: qty 40

## 2012-08-21 MED ORDER — PHENYLEPHRINE HCL 10 MG/ML IJ SOLN
30.0000 ug/min | INTRAVENOUS | Status: AC
  Administered 2012-08-22: 10 ug/min via INTRAVENOUS
  Filled 2012-08-21: qty 2

## 2012-08-21 MED ORDER — NITROGLYCERIN IN D5W 200-5 MCG/ML-% IV SOLN
2.0000 ug/min | INTRAVENOUS | Status: AC
  Administered 2012-08-22: 5 ug/min via INTRAVENOUS
  Filled 2012-08-21: qty 250

## 2012-08-21 MED ORDER — MAGNESIUM SULFATE 50 % IJ SOLN
40.0000 meq | INTRAMUSCULAR | Status: DC
  Filled 2012-08-21: qty 10

## 2012-08-21 MED ORDER — DEXMEDETOMIDINE HCL IN NACL 400 MCG/100ML IV SOLN
0.1000 ug/kg/h | INTRAVENOUS | Status: AC
  Administered 2012-08-22: 0.2 ug/kg/h via INTRAVENOUS
  Filled 2012-08-21: qty 100

## 2012-08-21 MED ORDER — NITROGLYCERIN 5 MG/ML IV SOLN
INTRAVENOUS | Status: AC
  Administered 2012-08-22: 09:00:00
  Filled 2012-08-21: qty 2.5

## 2012-08-21 MED ORDER — DEXTROSE 5 % IV SOLN
750.0000 mg | INTRAVENOUS | Status: DC
  Filled 2012-08-21: qty 750

## 2012-08-21 MED ORDER — SODIUM CHLORIDE 0.9 % IV SOLN
INTRAVENOUS | Status: AC
  Administered 2012-08-22: 4.7 [IU]/h via INTRAVENOUS
  Filled 2012-08-21: qty 1

## 2012-08-21 NOTE — Progress Notes (Signed)
Pt for procedure on tomorrow(08/22/2012).  Noted that results of PFT(ordered on 08/19/2012) were not in EPIC or PAT.  Respiratory called.  Stated that test was done portable on 08/19/2012-she stated she would fax results to Short Stay.

## 2012-08-22 ENCOUNTER — Encounter (HOSPITAL_COMMUNITY): Payer: Self-pay

## 2012-08-22 ENCOUNTER — Inpatient Hospital Stay (HOSPITAL_COMMUNITY): Payer: Medicare Other | Admitting: Certified Registered Nurse Anesthetist

## 2012-08-22 ENCOUNTER — Inpatient Hospital Stay (HOSPITAL_COMMUNITY)
Admission: RE | Admit: 2012-08-22 | Discharge: 2012-08-26 | DRG: 236 | Disposition: A | Discharge status: Home / Self Care | Payer: Medicare Other | Source: Ambulatory Visit | Attending: Cardiothoracic Surgery | Admitting: Cardiothoracic Surgery

## 2012-08-22 ENCOUNTER — Encounter (HOSPITAL_COMMUNITY)
Admission: RE | Disposition: A | Discharge status: Home / Self Care | Payer: Self-pay | Source: Ambulatory Visit | Attending: Cardiothoracic Surgery

## 2012-08-22 ENCOUNTER — Encounter (HOSPITAL_COMMUNITY): Payer: Self-pay | Admitting: Certified Registered Nurse Anesthetist

## 2012-08-22 ENCOUNTER — Inpatient Hospital Stay (HOSPITAL_COMMUNITY): Payer: Medicare Other

## 2012-08-22 DIAGNOSIS — Z794 Long term (current) use of insulin: Secondary | ICD-10-CM

## 2012-08-22 DIAGNOSIS — Z87891 Personal history of nicotine dependence: Secondary | ICD-10-CM

## 2012-08-22 DIAGNOSIS — Z951 Presence of aortocoronary bypass graft: Secondary | ICD-10-CM

## 2012-08-22 DIAGNOSIS — Z01812 Encounter for preprocedural laboratory examination: Secondary | ICD-10-CM

## 2012-08-22 DIAGNOSIS — J4489 Other specified chronic obstructive pulmonary disease: Secondary | ICD-10-CM | POA: Diagnosis present

## 2012-08-22 DIAGNOSIS — D62 Acute posthemorrhagic anemia: Secondary | ICD-10-CM | POA: Diagnosis not present

## 2012-08-22 DIAGNOSIS — E119 Type 2 diabetes mellitus without complications: Secondary | ICD-10-CM | POA: Diagnosis present

## 2012-08-22 DIAGNOSIS — R160 Hepatomegaly, not elsewhere classified: Secondary | ICD-10-CM | POA: Diagnosis present

## 2012-08-22 DIAGNOSIS — K219 Gastro-esophageal reflux disease without esophagitis: Secondary | ICD-10-CM | POA: Diagnosis present

## 2012-08-22 DIAGNOSIS — Z7982 Long term (current) use of aspirin: Secondary | ICD-10-CM

## 2012-08-22 DIAGNOSIS — I10 Essential (primary) hypertension: Secondary | ICD-10-CM | POA: Diagnosis present

## 2012-08-22 DIAGNOSIS — I2510 Atherosclerotic heart disease of native coronary artery without angina pectoris: Principal | ICD-10-CM | POA: Diagnosis present

## 2012-08-22 DIAGNOSIS — Z79899 Other long term (current) drug therapy: Secondary | ICD-10-CM

## 2012-08-22 DIAGNOSIS — F1010 Alcohol abuse, uncomplicated: Secondary | ICD-10-CM | POA: Diagnosis present

## 2012-08-22 DIAGNOSIS — Z23 Encounter for immunization: Secondary | ICD-10-CM

## 2012-08-22 DIAGNOSIS — I739 Peripheral vascular disease, unspecified: Secondary | ICD-10-CM | POA: Diagnosis present

## 2012-08-22 DIAGNOSIS — E785 Hyperlipidemia, unspecified: Secondary | ICD-10-CM | POA: Diagnosis present

## 2012-08-22 DIAGNOSIS — E7800 Pure hypercholesterolemia, unspecified: Secondary | ICD-10-CM | POA: Diagnosis present

## 2012-08-22 DIAGNOSIS — J449 Chronic obstructive pulmonary disease, unspecified: Secondary | ICD-10-CM | POA: Diagnosis present

## 2012-08-22 DIAGNOSIS — E8779 Other fluid overload: Secondary | ICD-10-CM | POA: Diagnosis not present

## 2012-08-22 DIAGNOSIS — D696 Thrombocytopenia, unspecified: Secondary | ICD-10-CM | POA: Diagnosis not present

## 2012-08-22 DIAGNOSIS — K5900 Constipation, unspecified: Secondary | ICD-10-CM | POA: Diagnosis not present

## 2012-08-22 HISTORY — PX: CORONARY ARTERY BYPASS GRAFT: SHX141

## 2012-08-22 HISTORY — PX: INTRAOPERATIVE TRANSESOPHAGEAL ECHOCARDIOGRAM: SHX5062

## 2012-08-22 LAB — POCT I-STAT 4, (NA,K, GLUC, HGB,HCT)
Glucose, Bld: 214 mg/dL — ABNORMAL HIGH (ref 70–99)
Glucose, Bld: 118 mg/dL — ABNORMAL HIGH (ref 70–99)
Glucose, Bld: 85 mg/dL (ref 70–99)
Glucose, Bld: 103 mg/dL — ABNORMAL HIGH (ref 70–99)
Glucose, Bld: 130 mg/dL — ABNORMAL HIGH (ref 70–99)
Glucose, Bld: 143 mg/dL — ABNORMAL HIGH (ref 70–99)
HCT: 44 % (ref 39.0–52.0)
HCT: 33 % — ABNORMAL LOW (ref 39.0–52.0)
HCT: 41 % (ref 39.0–52.0)
HCT: 35 % — ABNORMAL LOW (ref 39.0–52.0)
HCT: 35 % — ABNORMAL LOW (ref 39.0–52.0)
HCT: 39 % (ref 39.0–52.0)
Hemoglobin: 15 g/dL (ref 13.0–17.0)
Hemoglobin: 11.2 g/dL — ABNORMAL LOW (ref 13.0–17.0)
Hemoglobin: 13.9 g/dL (ref 13.0–17.0)
Hemoglobin: 11.9 g/dL — ABNORMAL LOW (ref 13.0–17.0)
Hemoglobin: 11.9 g/dL — ABNORMAL LOW (ref 13.0–17.0)
Hemoglobin: 13.3 g/dL (ref 13.0–17.0)
Potassium: 3.6 mEq/L (ref 3.5–5.1)
Potassium: 3.4 mEq/L — ABNORMAL LOW (ref 3.5–5.1)
Potassium: 3.8 mEq/L (ref 3.5–5.1)
Potassium: 4.7 mEq/L (ref 3.5–5.1)
Potassium: 3.4 mEq/L — ABNORMAL LOW (ref 3.5–5.1)
Potassium: 3.6 mEq/L (ref 3.5–5.1)
Sodium: 139 mEq/L (ref 135–145)
Sodium: 138 mEq/L (ref 135–145)
Sodium: 140 mEq/L (ref 135–145)
Sodium: 139 mEq/L (ref 135–145)
Sodium: 142 mEq/L (ref 135–145)
Sodium: 143 mEq/L (ref 135–145)

## 2012-08-22 LAB — CREATININE, SERUM
Creatinine, Ser: 0.61 mg/dL (ref 0.50–1.35)
GFR calc Af Amer: >90 mL/min (ref >90)
GFR calc non Af Amer: >90 mL/min (ref >90)

## 2012-08-22 LAB — POCT I-STAT 3, ART BLOOD GAS (G3+)
Acid-Base Excess: 1 mmol/L (ref 0.0–2.0)
Acid-base deficit: 1 mmol/L (ref 0.0–2.0)
Acid-base deficit: 3 mmol/L — ABNORMAL HIGH (ref 0.0–2.0)
Bicarbonate: 27.8 mEq/L — ABNORMAL HIGH (ref 20.0–24.0)
Bicarbonate: 25.9 mEq/L — ABNORMAL HIGH (ref 20.0–24.0)
Bicarbonate: 26.7 mEq/L — ABNORMAL HIGH (ref 20.0–24.0)
O2 Saturation: 100 %
O2 Saturation: 96 %
O2 Saturation: 95 %
Patient temperature: 36
Patient temperature: 37.4
TCO2: 29 mmol/L (ref 0–100)
TCO2: 27 mmol/L (ref 0–100)
TCO2: 29 mmol/L (ref 0–100)
pCO2 arterial: 54.4 mmHg — ABNORMAL HIGH (ref 35.0–45.0)
pCO2 arterial: 50.3 mmHg — ABNORMAL HIGH (ref 35.0–45.0)
pCO2 arterial: 68.9 mmHg (ref 35.0–45.0)
pH, Arterial: 7.317 — ABNORMAL LOW (ref 7.350–7.450)
pH, Arterial: 7.315 — ABNORMAL LOW (ref 7.350–7.450)
pH, Arterial: 7.199 — CL (ref 7.350–7.450)
pO2, Arterial: 384 mmHg — ABNORMAL HIGH (ref 80.0–100.0)
pO2, Arterial: 90 mmHg (ref 80.0–100.0)
pO2, Arterial: 94 mmHg (ref 80.0–100.0)

## 2012-08-22 LAB — POCT I-STAT, CHEM 8
BUN: 9 mg/dL (ref 6–23)
Calcium, Ion: 1.17 mmol/L (ref 1.12–1.23)
Chloride: 106 mEq/L (ref 96–112)
Creatinine, Ser: 0.7 mg/dL (ref 0.50–1.35)
Glucose, Bld: 113 mg/dL — ABNORMAL HIGH (ref 70–99)
HCT: 38 % — ABNORMAL LOW (ref 39.0–52.0)
Hemoglobin: 12.9 g/dL — ABNORMAL LOW (ref 13.0–17.0)
Potassium: 4.1 mEq/L (ref 3.5–5.1)
Sodium: 142 mEq/L (ref 135–145)
TCO2: 25 mmol/L (ref 0–100)

## 2012-08-22 LAB — CBC
HCT: 38.3 % — ABNORMAL LOW (ref 39.0–52.0)
Hemoglobin: 13.1 g/dL (ref 13.0–17.0)
Hemoglobin: 13.4 g/dL (ref 13.0–17.0)
MCH: 31.7 pg (ref 26.0–34.0)
MCH: 31.9 pg (ref 26.0–34.0)
MCHC: 35 g/dL (ref 30.0–36.0)
MCV: 90.1 fL (ref 78.0–100.0)
MCV: 91.2 fL (ref 78.0–100.0)
Platelets: 124 10*3/uL — ABNORMAL LOW (ref 150–400)
RBC: 4.13 MIL/uL — ABNORMAL LOW (ref 4.22–5.81)
RBC: 4.2 MIL/uL — ABNORMAL LOW (ref 4.22–5.81)
RDW: 12.1 % (ref 11.5–15.5)
WBC: 15.8 10*3/uL — ABNORMAL HIGH (ref 4.0–10.5)

## 2012-08-22 LAB — GLUCOSE, CAPILLARY
Glucose-Capillary: 302 mg/dL — ABNORMAL HIGH (ref 70–99)
Glucose-Capillary: 120 mg/dL — ABNORMAL HIGH (ref 70–99)
Glucose-Capillary: 112 mg/dL — ABNORMAL HIGH (ref 70–99)
Glucose-Capillary: 102 mg/dL — ABNORMAL HIGH (ref 70–99)
Glucose-Capillary: 116 mg/dL — ABNORMAL HIGH (ref 70–99)
Glucose-Capillary: 107 mg/dL — ABNORMAL HIGH (ref 70–99)
Glucose-Capillary: 126 mg/dL — ABNORMAL HIGH (ref 70–99)

## 2012-08-22 LAB — PROTIME-INR: Prothrombin Time: 15.7 seconds — ABNORMAL HIGH (ref 11.6–15.2)

## 2012-08-22 LAB — PLATELET COUNT: Platelets: 143 10*3/uL — ABNORMAL LOW (ref 150–400)

## 2012-08-22 LAB — MAGNESIUM: Magnesium: 2.9 mg/dL — ABNORMAL HIGH (ref 1.5–2.5)

## 2012-08-22 LAB — HEMOGLOBIN AND HEMATOCRIT, BLOOD
HCT: 34.7 % — ABNORMAL LOW (ref 39.0–52.0)
Hemoglobin: 12.3 g/dL — ABNORMAL LOW (ref 13.0–17.0)

## 2012-08-22 SURGERY — CORONARY ARTERY BYPASS GRAFTING (CABG)
Anesthesia: General | Site: Chest | Wound class: Clean

## 2012-08-22 MED ORDER — LACTATED RINGERS IV SOLN
INTRAVENOUS | Status: DC
  Administered 2012-08-22 (×2): via INTRAVENOUS

## 2012-08-22 MED ORDER — LACTATED RINGERS IV SOLN
INTRAVENOUS | Status: DC | PRN
  Administered 2012-08-22 (×5): via INTRAVENOUS

## 2012-08-22 MED ORDER — DOCUSATE SODIUM 100 MG PO CAPS
200.0000 mg | ORAL_CAPSULE | Freq: Every day | ORAL | Status: DC
  Administered 2012-08-23: 200 mg via ORAL
  Filled 2012-08-22: qty 2

## 2012-08-22 MED ORDER — SODIUM CHLORIDE 0.9 % IJ SOLN: 3.0000 mL | INTRAMUSCULAR | Status: DC | PRN

## 2012-08-22 MED ORDER — DEXTROSE 5 % IV SOLN
10.0000 mg | INTRAVENOUS | Status: DC | PRN
  Administered 2012-08-22: 50 ug/min via INTRAVENOUS

## 2012-08-22 MED ORDER — ASPIRIN 81 MG PO CHEW: 324.0000 mg | CHEWABLE_TABLET | Freq: Every day | ORAL | Status: DC

## 2012-08-22 MED ORDER — FAMOTIDINE IN NACL 20-0.9 MG/50ML-% IV SOLN
20.0000 mg | Freq: Two times a day (BID) | INTRAVENOUS | Status: AC
  Administered 2012-08-22: 20 mg via INTRAVENOUS

## 2012-08-22 MED ORDER — ROCURONIUM BROMIDE 100 MG/10ML IV SOLN
INTRAVENOUS | Status: DC | PRN
  Administered 2012-08-22: 50 mg via INTRAVENOUS

## 2012-08-22 MED ORDER — DEXMEDETOMIDINE HCL IN NACL 200 MCG/50ML IV SOLN: 0.1000 ug/kg/h | INTRAVENOUS | Status: DC

## 2012-08-22 MED ORDER — IPRATROPIUM-ALBUTEROL 18-103 MCG/ACT IN AERO
2.0000 | INHALATION_SPRAY | RESPIRATORY_TRACT | Status: DC
  Administered 2012-08-22 – 2012-08-25 (×17): 2 via RESPIRATORY_TRACT
  Filled 2012-08-22: qty 14.7

## 2012-08-22 MED ORDER — METOPROLOL TARTRATE 1 MG/ML IV SOLN
2.5000 mg | INTRAVENOUS | Status: DC | PRN
  Administered 2012-08-23: 5 mg via INTRAVENOUS

## 2012-08-22 MED ORDER — VITAMIN B-1 100 MG PO TABS
100.0000 mg | ORAL_TABLET | Freq: Every day | ORAL | Status: DC
  Administered 2012-08-23 – 2012-08-26 (×4): 100 mg via ORAL
  Filled 2012-08-22 (×5): qty 1

## 2012-08-22 MED ORDER — METOPROLOL TARTRATE 25 MG/10 ML ORAL SUSPENSION
12.5000 mg | Freq: Two times a day (BID) | ORAL | Status: DC
  Filled 2012-08-22 (×5): qty 5

## 2012-08-22 MED ORDER — ALBUMIN HUMAN 5 % IV SOLN
250.0000 mL | INTRAVENOUS | Status: AC | PRN
  Administered 2012-08-22 – 2012-08-23 (×3): 250 mL via INTRAVENOUS
  Filled 2012-08-22: qty 250

## 2012-08-22 MED ORDER — INSULIN REGULAR BOLUS VIA INFUSION
0.0000 [IU] | Freq: Three times a day (TID) | INTRAVENOUS | Status: DC
  Administered 2012-08-23: 3.1 [IU] via INTRAVENOUS
  Filled 2012-08-22: qty 10

## 2012-08-22 MED ORDER — ONDANSETRON HCL 4 MG/2ML IJ SOLN: 4.0000 mg | Freq: Four times a day (QID) | INTRAMUSCULAR | Status: DC | PRN

## 2012-08-22 MED ORDER — MORPHINE SULFATE 2 MG/ML IJ SOLN
1.0000 mg | INTRAMUSCULAR | Status: AC | PRN
  Filled 2012-08-22: qty 1
  Filled 2012-08-22: qty 2

## 2012-08-22 MED ORDER — AMINOCAPROIC ACID 250 MG/ML IV SOLN
INTRAVENOUS | Status: DC | PRN
  Administered 2012-08-22: 5 g via INTRAVENOUS

## 2012-08-22 MED ORDER — LACTATED RINGERS IV SOLN: 500.0000 mL | Freq: Once | INTRAVENOUS | Status: AC | PRN

## 2012-08-22 MED ORDER — HEPARIN SODIUM (PORCINE) 1000 UNIT/ML IJ SOLN
INTRAMUSCULAR | Status: DC | PRN
  Administered 2012-08-22: 25000 [IU] via INTRAVENOUS

## 2012-08-22 MED ORDER — SODIUM CHLORIDE 0.9 % IV SOLN
INTRAVENOUS | Status: DC
  Filled 2012-08-22: qty 1

## 2012-08-22 MED ORDER — METOPROLOL TARTRATE 12.5 MG HALF TABLET
12.5000 mg | ORAL_TABLET | Freq: Two times a day (BID) | ORAL | Status: DC
  Administered 2012-08-22 – 2012-08-23 (×3): 12.5 mg via ORAL
  Filled 2012-08-22 (×5): qty 1

## 2012-08-22 MED ORDER — DOPAMINE-DEXTROSE 3.2-5 MG/ML-% IV SOLN: 0.0000 ug/kg/min | INTRAVENOUS | Status: DC

## 2012-08-22 MED ORDER — SODIUM CHLORIDE 0.9 % IJ SOLN
3.0000 mL | Freq: Two times a day (BID) | INTRAMUSCULAR | Status: DC
  Administered 2012-08-23 (×2): 3 mL via INTRAVENOUS

## 2012-08-22 MED ORDER — HEMOSTATIC AGENTS (NO CHARGE) OPTIME
TOPICAL | Status: DC | PRN
  Administered 2012-08-22: 1 via TOPICAL

## 2012-08-22 MED ORDER — VECURONIUM BROMIDE 10 MG IV SOLR
INTRAVENOUS | Status: DC | PRN
  Administered 2012-08-22: 5 mg via INTRAVENOUS
  Administered 2012-08-22: 4 mg via INTRAVENOUS
  Administered 2012-08-22: 5 mg via INTRAVENOUS
  Administered 2012-08-22: 6 mg via INTRAVENOUS

## 2012-08-22 MED ORDER — VANCOMYCIN HCL IN DEXTROSE 1-5 GM/200ML-% IV SOLN
1000.0000 mg | Freq: Once | INTRAVENOUS | Status: AC
  Administered 2012-08-22: 1000 mg via INTRAVENOUS
  Filled 2012-08-22: qty 200

## 2012-08-22 MED ORDER — ACETAMINOPHEN 10 MG/ML IV SOLN
1000.0000 mg | Freq: Once | INTRAVENOUS | Status: AC
  Administered 2012-08-22: 1000 mg via INTRAVENOUS
  Filled 2012-08-22: qty 100

## 2012-08-22 MED ORDER — ALBUMIN HUMAN 5 % IV SOLN
INTRAVENOUS | Status: DC | PRN
  Administered 2012-08-22: 13:00:00 via INTRAVENOUS

## 2012-08-22 MED ORDER — MORPHINE SULFATE 2 MG/ML IJ SOLN
2.0000 mg | INTRAMUSCULAR | Status: DC | PRN
  Administered 2012-08-23 (×4): 2 mg via INTRAVENOUS
  Filled 2012-08-22 (×3): qty 1

## 2012-08-22 MED ORDER — LEVALBUTEROL HCL 0.63 MG/3ML IN NEBU
0.6300 mg | INHALATION_SOLUTION | Freq: Three times a day (TID) | RESPIRATORY_TRACT | Status: DC | PRN
  Filled 2012-08-22: qty 3

## 2012-08-22 MED ORDER — ACETAMINOPHEN 500 MG PO TABS
1000.0000 mg | ORAL_TABLET | Freq: Four times a day (QID) | ORAL | Status: DC
  Administered 2012-08-23 – 2012-08-24 (×6): 1000 mg via ORAL
  Filled 2012-08-22 (×10): qty 2

## 2012-08-22 MED ORDER — ASPIRIN EC 325 MG PO TBEC
325.0000 mg | DELAYED_RELEASE_TABLET | Freq: Every day | ORAL | Status: DC
  Administered 2012-08-23: 325 mg via ORAL
  Filled 2012-08-22 (×2): qty 1

## 2012-08-22 MED ORDER — SODIUM CHLORIDE 0.9 % IV SOLN: INTRAVENOUS | Status: DC

## 2012-08-22 MED ORDER — SIMVASTATIN 40 MG PO TABS
40.0000 mg | ORAL_TABLET | Freq: Every day | ORAL | Status: DC
  Administered 2012-08-23 – 2012-08-26 (×4): 40 mg via ORAL
  Filled 2012-08-22 (×5): qty 1

## 2012-08-22 MED ORDER — PROTAMINE SULFATE 10 MG/ML IV SOLN
INTRAVENOUS | Status: DC | PRN
  Administered 2012-08-22: 50 mg via INTRAVENOUS
  Administered 2012-08-22: 20 mg via INTRAVENOUS
  Administered 2012-08-22: 50 mg via INTRAVENOUS
  Administered 2012-08-22: 10 mg via INTRAVENOUS
  Administered 2012-08-22: 50 mg via INTRAVENOUS
  Administered 2012-08-22: 20 mg via INTRAVENOUS
  Administered 2012-08-22: 50 mg via INTRAVENOUS

## 2012-08-22 MED ORDER — MIDAZOLAM HCL 2 MG/2ML IJ SOLN
2.0000 mg | INTRAMUSCULAR | Status: DC | PRN
  Administered 2012-08-22: 2 mg via INTRAVENOUS
  Filled 2012-08-22 (×2): qty 2

## 2012-08-22 MED ORDER — SODIUM CHLORIDE 0.9 % IJ SOLN
OROMUCOSAL | Status: DC | PRN
  Administered 2012-08-22 (×3): via TOPICAL

## 2012-08-22 MED ORDER — TAMSULOSIN HCL 0.4 MG PO CAPS
0.4000 mg | ORAL_CAPSULE | Freq: Every day | ORAL | Status: DC
  Administered 2012-08-23 – 2012-08-26 (×4): 0.4 mg via ORAL
  Filled 2012-08-22 (×4): qty 1

## 2012-08-22 MED ORDER — BISACODYL 5 MG PO TBEC
10.0000 mg | DELAYED_RELEASE_TABLET | Freq: Every day | ORAL | Status: DC
  Administered 2012-08-23: 10 mg via ORAL
  Filled 2012-08-22: qty 2

## 2012-08-22 MED ORDER — POTASSIUM CHLORIDE 10 MEQ/50ML IV SOLN
10.0000 meq | INTRAVENOUS | Status: AC
  Administered 2012-08-22 (×3): 10 meq via INTRAVENOUS

## 2012-08-22 MED ORDER — PROPOFOL 10 MG/ML IV BOLUS
INTRAVENOUS | Status: DC | PRN
  Administered 2012-08-22: 150 mg via INTRAVENOUS

## 2012-08-22 MED ORDER — 0.9 % SODIUM CHLORIDE (POUR BTL) OPTIME
TOPICAL | Status: DC | PRN
  Administered 2012-08-22: 6000 mL

## 2012-08-22 MED ORDER — BISACODYL 10 MG RE SUPP: 10.0000 mg | Freq: Every day | RECTAL | Status: DC

## 2012-08-22 MED ORDER — CHLORHEXIDINE GLUCONATE 4 % EX LIQD: 30.0000 mL | CUTANEOUS | Status: DC

## 2012-08-22 MED ORDER — INSULIN ASPART 100 UNIT/ML ~~LOC~~ SOLN
SUBCUTANEOUS | Status: AC
  Administered 2012-08-22: 10 [IU] via SUBCUTANEOUS
  Filled 2012-08-22: qty 1

## 2012-08-22 MED ORDER — DEXTROSE 5 % IV SOLN
1.5000 g | Freq: Two times a day (BID) | INTRAVENOUS | Status: DC
  Administered 2012-08-23 (×3): 1.5 g via INTRAVENOUS
  Filled 2012-08-22 (×4): qty 1.5

## 2012-08-22 MED ORDER — ACETAMINOPHEN 160 MG/5ML PO SOLN: 975.0000 mg | Freq: Four times a day (QID) | ORAL | Status: DC

## 2012-08-22 MED ORDER — ARTIFICIAL TEARS OP OINT
TOPICAL_OINTMENT | OPHTHALMIC | Status: DC | PRN
  Administered 2012-08-22: 1 via OPHTHALMIC

## 2012-08-22 MED ORDER — METOPROLOL TARTRATE 12.5 MG HALF TABLET
12.5000 mg | ORAL_TABLET | Freq: Once | ORAL | Status: AC
  Administered 2012-08-22: 12.5 mg via ORAL
  Filled 2012-08-22: qty 1

## 2012-08-22 MED ORDER — PHENYLEPHRINE HCL 10 MG/ML IJ SOLN
0.0000 ug/min | INTRAVENOUS | Status: DC
  Filled 2012-08-22: qty 2

## 2012-08-22 MED ORDER — SODIUM CHLORIDE 0.9 % IV SOLN
INTRAVENOUS | Status: DC | PRN
  Administered 2012-08-22: 12:00:00 via INTRAVENOUS

## 2012-08-22 MED ORDER — FENTANYL CITRATE 0.05 MG/ML IJ SOLN
INTRAMUSCULAR | Status: DC | PRN
  Administered 2012-08-22: 100 ug via INTRAVENOUS
  Administered 2012-08-22: 250 ug via INTRAVENOUS
  Administered 2012-08-22: 50 ug via INTRAVENOUS
  Administered 2012-08-22: 150 ug via INTRAVENOUS
  Administered 2012-08-22: 250 ug via INTRAVENOUS
  Administered 2012-08-22 (×2): 100 ug via INTRAVENOUS
  Administered 2012-08-22: 250 ug via INTRAVENOUS
  Administered 2012-08-22 (×2): 150 ug via INTRAVENOUS
  Administered 2012-08-22 (×2): 100 ug via INTRAVENOUS

## 2012-08-22 MED ORDER — DULOXETINE HCL 60 MG PO CPEP
60.0000 mg | ORAL_CAPSULE | Freq: Every day | ORAL | Status: DC
  Administered 2012-08-23 – 2012-08-26 (×4): 60 mg via ORAL
  Filled 2012-08-22 (×5): qty 1

## 2012-08-22 MED ORDER — PANTOPRAZOLE SODIUM 40 MG PO TBEC
40.0000 mg | DELAYED_RELEASE_TABLET | Freq: Every day | ORAL | Status: DC
  Administered 2012-08-23: 40 mg via ORAL
  Filled 2012-08-22: qty 1

## 2012-08-22 MED ORDER — MAGNESIUM SULFATE 40 MG/ML IJ SOLN
4.0000 g | Freq: Once | INTRAMUSCULAR | Status: AC
  Administered 2012-08-22: 4 g via INTRAVENOUS
  Filled 2012-08-22: qty 100

## 2012-08-22 MED ORDER — SODIUM CHLORIDE 0.9 % IV SOLN: 250.0000 mL | INTRAVENOUS | Status: DC | PRN

## 2012-08-22 MED ORDER — SODIUM CHLORIDE 0.45 % IV SOLN
INTRAVENOUS | Status: DC
  Administered 2012-08-22: 14:00:00 via INTRAVENOUS

## 2012-08-22 MED ORDER — FOLIC ACID 1 MG PO TABS
1.0000 mg | ORAL_TABLET | Freq: Every day | ORAL | Status: DC
  Administered 2012-08-23 – 2012-08-26 (×4): 1 mg via ORAL
  Filled 2012-08-22 (×5): qty 1

## 2012-08-22 MED ORDER — OXYCODONE HCL 5 MG PO TABS
5.0000 mg | ORAL_TABLET | ORAL | Status: DC | PRN
  Administered 2012-08-23 (×2): 10 mg via ORAL
  Administered 2012-08-23 – 2012-08-24 (×3): 5 mg via ORAL
  Filled 2012-08-22: qty 1
  Filled 2012-08-22 (×2): qty 2
  Filled 2012-08-22: qty 1
  Filled 2012-08-22: qty 2

## 2012-08-22 MED ORDER — MIDAZOLAM HCL 5 MG/5ML IJ SOLN
INTRAMUSCULAR | Status: DC | PRN
  Administered 2012-08-22: 2 mg via INTRAVENOUS
  Administered 2012-08-22: 1 mg via INTRAVENOUS
  Administered 2012-08-22: 4 mg via INTRAVENOUS
  Administered 2012-08-22: 1 mg via INTRAVENOUS
  Administered 2012-08-22 (×2): 4 mg via INTRAVENOUS
  Administered 2012-08-22 (×2): 2 mg via INTRAVENOUS

## 2012-08-22 MED ORDER — NITROGLYCERIN IN D5W 200-5 MCG/ML-% IV SOLN: 0.0000 ug/min | INTRAVENOUS | Status: DC

## 2012-08-22 SURGICAL SUPPLY — 110 items
ATTRACTOMAT 16X20 MAGNETIC DRP (DRAPES) ×3 IMPLANT
BAG DECANTER FOR FLEXI CONT (MISCELLANEOUS) ×3 IMPLANT
BANDAGE ELASTIC 4 VELCRO ST LF (GAUZE/BANDAGES/DRESSINGS) ×1 IMPLANT
BANDAGE ELASTIC 6 VELCRO ST LF (GAUZE/BANDAGES/DRESSINGS) ×1 IMPLANT
BANDAGE GAUZE ELAST BULKY 4 IN (GAUZE/BANDAGES/DRESSINGS) ×1 IMPLANT
BLADE STERNUM SYSTEM 6 (BLADE) ×3 IMPLANT
BLADE SURG 11 STRL SS (BLADE) ×1 IMPLANT
BLADE SURG ROTATE 9660 (MISCELLANEOUS) IMPLANT
CANISTER SUCTION 2500CC (MISCELLANEOUS) ×3 IMPLANT
CANN PRFSN .5XCNCT 15X34-48 (MISCELLANEOUS) ×2
CANNULA AORTIC HI-FLOW 6.5M20F (CANNULA) ×3 IMPLANT
CANNULA PRFSN .5XCNCT 15X34-48 (MISCELLANEOUS) ×2 IMPLANT
CANNULA VEN 2 STAGE (MISCELLANEOUS) ×3
CATH CPB KIT GERHARDT (MISCELLANEOUS) ×3 IMPLANT
CATH THORACIC 28FR (CATHETERS) ×3 IMPLANT
CATH THORACIC 36FR (CATHETERS) IMPLANT
CATH THORACIC 36FR RT ANG (CATHETERS) IMPLANT
CLIP FOGARTY SPRING 6M (CLIP) ×1 IMPLANT
CLIP TI MEDIUM 24 (CLIP) IMPLANT
CLIP TI WIDE RED SMALL 24 (CLIP) IMPLANT
CLOTH BEACON ORANGE TIMEOUT ST (SAFETY) ×3 IMPLANT
COVER SURGICAL LIGHT HANDLE (MISCELLANEOUS) ×3 IMPLANT
CRADLE DONUT ADULT HEAD (MISCELLANEOUS) ×3 IMPLANT
DRAIN CHANNEL 28F RND 3/8 FF (WOUND CARE) ×3 IMPLANT
DRAPE CARDIOVASCULAR INCISE (DRAPES) ×3
DRAPE SLUSH/WARMER DISC (DRAPES) ×3 IMPLANT
DRAPE SRG 135X102X78XABS (DRAPES) ×2 IMPLANT
DRSG COVADERM 4X14 (GAUZE/BANDAGES/DRESSINGS) ×1 IMPLANT
ELECT BLADE 4.0 EZ CLEAN MEGAD (MISCELLANEOUS) ×3
ELECT REM PT RETURN 9FT ADLT (ELECTROSURGICAL) ×6
ELECTRODE BLDE 4.0 EZ CLN MEGD (MISCELLANEOUS) ×2 IMPLANT
ELECTRODE REM PT RTRN 9FT ADLT (ELECTROSURGICAL) ×4 IMPLANT
GLOVE BIO SURGEON STRL SZ 6 (GLOVE) ×2 IMPLANT
GLOVE BIO SURGEON STRL SZ 6.5 (GLOVE) ×11 IMPLANT
GLOVE BIO SURGEON STRL SZ7 (GLOVE) IMPLANT
GLOVE BIO SURGEON STRL SZ7.5 (GLOVE) IMPLANT
GLOVE BIOGEL PI IND STRL 6 (GLOVE) IMPLANT
GLOVE BIOGEL PI IND STRL 6.5 (GLOVE) IMPLANT
GLOVE BIOGEL PI IND STRL 7.0 (GLOVE) IMPLANT
GLOVE BIOGEL PI INDICATOR 6 (GLOVE) ×1
GLOVE BIOGEL PI INDICATOR 6.5 (GLOVE) ×2
GLOVE BIOGEL PI INDICATOR 7.0 (GLOVE)
GOWN STRL NON-REIN LRG LVL3 (GOWN DISPOSABLE) ×16 IMPLANT
HEMOSTAT POWDER SURGIFOAM 1G (HEMOSTASIS) ×10 IMPLANT
HEMOSTAT SURGICEL 2X14 (HEMOSTASIS) ×3 IMPLANT
INSERT FOGARTY 61MM (MISCELLANEOUS) IMPLANT
INSERT FOGARTY XLG (MISCELLANEOUS) IMPLANT
KIT BASIN OR (CUSTOM PROCEDURE TRAY) ×3 IMPLANT
KIT ROOM TURNOVER OR (KITS) ×3 IMPLANT
KIT SUCTION CATH 14FR (SUCTIONS) ×6 IMPLANT
KIT VASOVIEW W/TROCAR VH 2000 (KITS) ×3 IMPLANT
LEAD PACING MYOCARDI (MISCELLANEOUS) ×3 IMPLANT
MARKER GRAFT CORONARY BYPASS (MISCELLANEOUS) ×9 IMPLANT
MARKER SKIN DUAL TIP RULER LAB (MISCELLANEOUS) ×1 IMPLANT
NS IRRIG 1000ML POUR BTL (IV SOLUTION) ×16 IMPLANT
PACK OPEN HEART (CUSTOM PROCEDURE TRAY) ×3 IMPLANT
PAD ARMBOARD 7.5X6 YLW CONV (MISCELLANEOUS) ×6 IMPLANT
PENCIL BUTTON HOLSTER BLD 10FT (ELECTRODE) ×3 IMPLANT
PUNCH AORTIC ROTATE 4.0MM (MISCELLANEOUS) IMPLANT
PUNCH AORTIC ROTATE 4.5MM 8IN (MISCELLANEOUS) ×1 IMPLANT
PUNCH AORTIC ROTATE 5MM 8IN (MISCELLANEOUS) IMPLANT
SET CARDIOPLEGIA MPS 5001102 (MISCELLANEOUS) ×1 IMPLANT
SOLUTION ANTI FOG 6CC (MISCELLANEOUS) IMPLANT
SPONGE GAUZE 4X4 12PLY (GAUZE/BANDAGES/DRESSINGS) ×6 IMPLANT
SPONGE LAP 18X18 X RAY DECT (DISPOSABLE) ×2 IMPLANT
SPONGE LAP 4X18 X RAY DECT (DISPOSABLE) IMPLANT
SUT BONE WAX W31G (SUTURE) ×3 IMPLANT
SUT MNCRL AB 4-0 PS2 18 (SUTURE) ×1 IMPLANT
SUT PROLENE 3 0 SH DA (SUTURE) IMPLANT
SUT PROLENE 3 0 SH1 36 (SUTURE) ×3 IMPLANT
SUT PROLENE 4 0 RB 1 (SUTURE)
SUT PROLENE 4 0 SH DA (SUTURE) IMPLANT
SUT PROLENE 4 0 TF (SUTURE) ×6 IMPLANT
SUT PROLENE 4-0 RB1 .5 CRCL 36 (SUTURE) IMPLANT
SUT PROLENE 5 0 C 1 36 (SUTURE) IMPLANT
SUT PROLENE 6 0 C 1 30 (SUTURE) ×2 IMPLANT
SUT PROLENE 6 0 CC (SUTURE) ×8 IMPLANT
SUT PROLENE 7 0 BV 1 (SUTURE) IMPLANT
SUT PROLENE 7 0 BV1 MDA (SUTURE) ×4 IMPLANT
SUT PROLENE 7.0 RB 3 (SUTURE) IMPLANT
SUT PROLENE 8 0 BV175 6 (SUTURE) ×8 IMPLANT
SUT SILK  1 MH (SUTURE)
SUT SILK 1 MH (SUTURE) IMPLANT
SUT SILK 2 0 SH CR/8 (SUTURE) IMPLANT
SUT SILK 3 0 SH CR/8 (SUTURE) IMPLANT
SUT STEEL 6MS V (SUTURE) ×3 IMPLANT
SUT STEEL STERNAL CCS#1 18IN (SUTURE) IMPLANT
SUT STEEL SZ 6 DBL 3X14 BALL (SUTURE) ×3 IMPLANT
SUT VIC AB 1 CTX 18 (SUTURE) ×6 IMPLANT
SUT VIC AB 1 CTX 36 (SUTURE)
SUT VIC AB 1 CTX36XBRD ANBCTR (SUTURE) IMPLANT
SUT VIC AB 2-0 CT1 27 (SUTURE) ×3
SUT VIC AB 2-0 CT1 TAPERPNT 27 (SUTURE) IMPLANT
SUT VIC AB 2-0 CTX 27 (SUTURE) IMPLANT
SUT VIC AB 3-0 SH 27 (SUTURE)
SUT VIC AB 3-0 SH 27X BRD (SUTURE) IMPLANT
SUT VIC AB 3-0 X1 27 (SUTURE) IMPLANT
SUT VICRYL 4-0 PS2 18IN ABS (SUTURE) IMPLANT
SUTURE E-PAK OPEN HEART (SUTURE) ×3 IMPLANT
SYSTEM SAHARA CHEST DRAIN ATS (WOUND CARE) ×3 IMPLANT
TAPE CLOTH SURG 4X10 WHT LF (GAUZE/BANDAGES/DRESSINGS) ×1 IMPLANT
TAPE PAPER 2X10 WHT MICROPORE (GAUZE/BANDAGES/DRESSINGS) ×1 IMPLANT
TOWEL OR 17X24 6PK STRL BLUE (TOWEL DISPOSABLE) ×6 IMPLANT
TOWEL OR 17X26 10 PK STRL BLUE (TOWEL DISPOSABLE) ×6 IMPLANT
TRAY FOLEY IC TEMP SENS 14FR (CATHETERS) ×3 IMPLANT
TUBE FEEDING 8FR 16IN STR KANG (MISCELLANEOUS) ×3 IMPLANT
TUBE SUCT INTRACARD DLP 20F (MISCELLANEOUS) ×3 IMPLANT
TUBING INSUFFLATION 10FT LAP (TUBING) ×3 IMPLANT
UNDERPAD 30X30 INCONTINENT (UNDERPADS AND DIAPERS) ×3 IMPLANT
WATER STERILE IRR 1000ML POUR (IV SOLUTION) ×6 IMPLANT

## 2012-08-22 NOTE — Transfer of Care (Signed)
Immediate Anesthesia Transfer of Care Note  Patient: [name]  Procedure(s) Performed: Procedure(s) (LRB) with comments: CORONARY ARTERY BYPASS GRAFTING (CABG) (N/A) - CABG x four,  using left internal mammary artery and right leg greater saphenous vein harvested endoscopically INTRAOPERATIVE TRANSESOPHAGEAL ECHOCARDIOGRAM (N/A)  Patient Location: ICU  Anesthesia Type:General  Level of Consciousness: sedated and Patient remains intubated per anesthesia plan  Airway & Oxygen Therapy: Patient remains intubated per anesthesia plan and Patient placed on Ventilator (see vital sign flow sheet for setting)  Post-op Assessment: Post -op Vital signs reviewed and stable and report given to [place], [place] 2308  Post vital signs: Reviewed and stable  Complications: No apparent anesthesia complications

## 2012-08-22 NOTE — Anesthesia Postprocedure Evaluation (Signed)
  Anesthesia Post-op Note  Patient: [name]  Procedure(s) Performed: Procedure(s) (LRB) with comments: CORONARY ARTERY BYPASS GRAFTING (CABG) (N/A) - CABG x four,  using left internal mammary artery and right leg greater saphenous vein harvested endoscopically INTRAOPERATIVE TRANSESOPHAGEAL ECHOCARDIOGRAM (N/A)  Patient Location: PACU and SICU  Anesthesia Type:General  Level of Consciousness: sedated  Airway and Oxygen Therapy: Ventilated  Post-op Pain: mild  Post-op Assessment: Post-op Vital signs reviewed  Post-op Vital Signs: Reviewed  Complications: No apparent anesthesia complications

## 2012-08-22 NOTE — H&P (Signed)
[address]            [place] [address]          [phone]     [name] Medical Record #[id number]  Date of Birth: 1961-02-21  Referring: Dr Rennis Golden  Primary Care: Fredirick Maudlin, MD  Chief Complaint: Abnormal calcium on ct scan of chest  History of Present Illness:  Patient is a 52 year old disabled male with long-standing tobacco abuse and poorly controlled diabetes. He notes that 2 years ago a lung nodule was noted on a CT scan of the chest. Recently he returned for followup CT to evaluate the nodule, the nodule had disappeared but was commented that he had significant calcification of the left main coronary vessels. He is referred to cardiology and cardiac catheterization was performed today. The patient has no definite history of myocardial infarction. He does have known underlying COPD, shortness of breath with exertion and fatigue. He denies definite angina.  Current Activity/ Functional Status:  Patient is independent with mobility/ambulation, transfers, ADL's, IADL's.  Past Medical History   Diagnosis  Date   .  Esophageal erosions  05/24/01     egd by Dr. Jena Gauss   .  Diabetes mellitus      type 2 poorly controlled for 5 years HGBA1C Reported as 11   .  Hypercholesteremia    .  Back pain    .  COPD (chronic obstructive pulmonary disease) pft' pending    .  Hyperlipidemia    .  GERD (gastroesophageal reflux disease)    .  H/O hiatal hernia    .  Arthritis    .  Tubular adenoma of colon  08/28/11   .  Unintentional weight loss    .  Tobacco abuse     Past Surgical History   Procedure  Date   .  Esophagogastroduodenoscopy  05/24/01     by Dr. Jena Gauss   .  Back surgery      x3   .  Shoulder surgery      right   .  Pelvic abcess drainage    .  Anterior cervical decomp/discectomy fusion  12/19/2011     Procedure: ANTERIOR CERVICAL DECOMPRESSION/DISCECTOMY FUSION 2 LEVELS; Surgeon: Karn Cassis, MD; Location: [facility]; Service: Neurosurgery; Laterality: N/A; Cervical four-five Cervical five-six Anterior cervical decompression/diskectomy, fusion, plate   .  Esophagogastroduodenoscopy  08/28/11     Rourk-erosive reflux esophagitis, gastric erosions   .  Colonoscopy w/ polypectomy  08/28/11     Rourk-tubular adenomas removed from ascending colon, suboptimal prep, diminutive rectal polyps    Family History   Problem  Relation  Age of Onset   .  Anesthesia problems  Neg Hx    .  Colon cancer  Neg Hx     History    Social History   .  Marital Status:  Single     Spouse Name:  N/A     Number of Children:  1   .  Years of Education:  N/A    Occupational History   .  disabled  due to back problems    Social History Main Topics   .  Smoking status:  Current Every Day Smoker -- 1.5 packs/day for 35 years     Types:  Cigarettes   .  Smokeless tobacco:  Not on file  Comment: Smokes one and a half packs of ciggarettes daily   .  Alcohol Use:  Yes      Comment: occasional, 12 pk beer per wk   .  Drug Use:  No   .      Social History Narrative    Lives w/ girlfriend    History   Smoking status   .  Current Every Day Smoker -- 1.5 packs/day for 35 years   .  Types:  Cigarettes   Smokeless tobacco   .  Not on file     Comment: Smokes one and a half packs of ciggarettes daily    History   Alcohol Use   .  Yes     Comment: occasional, 12 pk beer per wk   No Known Allergies  Current Facility-Administered Medications   Medication  Dose  Route  Frequency  Provider  Last Rate  Last Dose   .  0.9 % sodium chloride infusion  250 mL  Intravenous  PRN  Chrystie Nose, MD     .  0.9 % sodium chloride infusion   Intravenous  Continuous  Chrystie Nose, MD  75 mL/hr at 08/19/12 [phone]    .  0.9 % sodium chloride infusion  1 mL/kg/hr  Intravenous  Continuous  Chrystie Nose, MD  77.6 mL/hr at 08/19/12 1331  1 mL/kg/hr at 08/19/12 1331   .  acetaminophen (TYLENOL) tablet 650 mg  650 mg  Oral  Q4H PRN   Chrystie Nose, MD     .  hydrALAZINE (APRESOLINE) injection 10 mg  10 mg  Intravenous  Q2H PRN  Chrystie Nose, MD     .  ondansetron [facility]) injection 4 mg  4 mg  Intravenous  Q6H PRN  Chrystie Nose, MD     .  oxyCODONE-acetaminophen (PERCOCET/ROXICET) 5-325 MG per tablet 1-2 tablet  1-2 tablet  Oral  Q4H PRN  Chrystie Nose, MD     .  sodium chloride 0.9 % injection 3 mL  3 mL  Intravenous  Q12H  Chrystie Nose, MD     .  sodium chloride 0.9 % injection 3 mL  3 mL  Intravenous  PRN  Chrystie Nose, MD      Medications Prior to Admission   Medication  Sig  Dispense  Refill   .  CYMBALTA 60 MG capsule  Take 60 mg by mouth daily.     Marland Kitchen  dexlansoprazole (DEXILANT) 60 MG capsule  Take 1 capsule (60 mg total) by mouth daily.  30 capsule  11   .  metFORMIN (GLUCOPHAGE) 1000 MG tablet  Take 1,000 mg by mouth 2 (two) times daily.     Marland Kitchen  NOVOLOG FLEXPEN 100 UNIT/ML injection  Inject 40 Units into the skin at bedtime.     Marland Kitchen  oxyCODONE-acetaminophen (PERCOCET/ROXICET) 5-325 MG per tablet  Take 1 tablet by mouth every 4 (four) hours as needed. For pain     .  simvastatin (ZOCOR) 40 MG tablet  Take 40 mg by mouth at bedtime.     .  Tamsulosin HCl (FLOMAX) 0.4 MG CAPS  Take 0.4 mg by mouth daily.     Review of Systems:  Cardiac Review of Systems: Y or N  Chest Pain [ n ] Resting SOB [ n ] Exertional SOB [ y ] Pollyann Kennedy Milo.Brash ]  Pedal Edema [ n ] Palpitations [ n ] Syncope [ n ] Presyncope [  n ]  General Review of Systems: [Y] = yes [ ] =no  Constitional: recent weight change [ loss of wt after neck surgery ]; anorexia [ ] ; fatigue [ y ]; nausea [ ] ; night sweats [ ] ; fever [n ]; or chills [ n ];  Dental: poor dentition[ y ]; Last Dentist visit: years  Eye : blurred vision [ ] ; diplopia [ ] ; vision changes [n ]; Amaurosis fugax[n ];  Resp: cough [ every morning ]; wheezing[ y ]; hemoptysis[n ]; shortness of breath[ y ]; paroxysmal nocturnal dyspnea[ y ]; dyspnea on exertion[ y ]; or orthopnea[  ];  GI: gallstones[ ] , vomiting[ ] ; dysphagia[ ] ; melena[ ] ; hematochezia [ ] ; heartburn[ ] ; Hx of Colonoscopy[ ] ;  GU: kidney stones [ ] ; hematuria[n ]; dysuria [ ] ; nocturia[ ] ; history of obstruction [ ] ;history of scrotal abscess  Skin: rash, swelling[ ] ;, hair loss[ ] ; peripheral edema[ ] ; or itching[ ] ;  Musculosketetal: myalgias[ y ]; joint swelling[ ] ; joint erythema[ ] ;  joint pain[ ] ; back pain[ ] ;  Heme/Lymph: bruising[ ] ; bleeding[ ] ; anemia[ ] ;  Neuro: TIA[ n]; headaches[ ] ; stroke[n ]; vertigo[ ] ; seizures[ n ]; paresthesias[ ] ; difficulty walking[ y ];  Psych:depression[ ] ; anxiety[ ] ;  Endocrine: diabetes[y ]; thyroid dysfunction[ n ];  Immunizations: Flu [ y ]; Pneumococcal[y ];  Other:  Physical Exam: BP 171/92  Pulse 82  Temp 97.8 F (36.6 C) (Oral)  Resp 20  Ht 5\' 11"  (1.803 m)  Wt 181 lb 6.4 oz (82.283 kg)  BMI 25.30 kg/m2  SpO2 94%   General appearance: alert, cooperative, appears older than stated age and no distress  Neurologic: intact  Heart: regular rate and rhythm, S1, S2 normal, no murmur, click, rub or gallop and normal apical impulse  Lungs: diminished breath sounds bibasilar and rhonchi bilaterally  Abdomen: soft, non-tender; bowel sounds normal; no masses, no organomegaly  Extremities: extremities normal, atraumatic, no cyanosis or edema and Homans sign is negative, no sign of DVT  no active scrotal infection, no carotid bruits no cervical or supraclavicular adenopathy.  Diagnostic Studies & Laboratory data:  Recent Radiology Findings:  RADIOLOGY REPORT*  Clinical Data: Pulmonary nodule, 36-month follow-up examination.  CT CHEST WITH CONTRAST  Technique: Multidetector CT imaging of the chest was performed  following the standard protocol during bolus administration of  intravenous contrast.  Contrast: 80mL OMNIPAQUE IOHEXOL 300 MG/ML SOLN  Comparison: Chest CT 01/24/2012.  Findings:  Mediastinum: Heart size is normal. There is no significant    pericardial fluid, thickening or pericardial calcification. There  is atherosclerosis of the thoracic aorta, the great vessels of the  mediastinum and the coronary arteries, including calcified  atherosclerotic plaque in the left main, left anterior descending,  left circumflex and right coronary arteries. No pathologically  enlarged mediastinal or hilar lymph nodes. Esophagus is  unremarkable in appearance. Incidental note is made of the  separate origin of the left vertebral artery directly off the  aortic arch (normal anatomical variant).  Lungs/Pleura: Previously noted 5 mm nodule in the inferior segment  of the lingula is no longer identified. There is a focal area of  scarring in the inferior segment of the lingula. There is a 4 mm  nodule in the periphery of the right lower lobe (image 42 of series  3) that is unchanged in retrospect compared to remote prior  examination 09/26/2009, and can be considered radiographically  benign requiring no imaging follow-up. No other larger more  suspicious appearing pulmonary nodules or masses  are otherwise  identified. No consolidative airspace disease. No pleural  effusions.  Upper Abdomen: Unremarkable.  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 There are no aggressive appearing  lytic or blastic lesions noted in the visualized portions of the  skeleton.  IMPRESSION:  1. The previously noted 5-mm lingular nodule has resolved  compatible with a benign etiology on the prior examination. There  are no suspicious appearing pulmonary nodules or masses on today's  examination.  2. Atherosclerosis, including left main and three-vessel coronary  artery disease. Please note that although the presence of coronary  artery calcium documents the presence of coronary artery disease,  the severity of this disease and any potential  stenosis cannot be  assessed on this non-gated CT examination. Assessment for  potential risk factor modification, dietary therapy or  pharmacologic therapy may be warranted, if clinically indicated.  Original Report Authenticated By: Trudie Reed, M.D.  CATH:  DASAN HARDMAN [id number]  1961-05-20  Performing Cardiologist: Chrystie Nose  Primary Physician: Fredirick Maudlin, MD  Primary Cardiologist: Dr. Rennis Golden  Procedures Performed:  Left Heart Catheterization via 5 Fr right femoral artery access  Left Ventriculography, (RAO/LAO) 15 ml/sec for 30 ml total contrast  Native Coronary Angiography  Pedicle LIMA angiography  Indication(s): Fatigue, dyspnea, multivessel calcified CAD on CT scan  History: 52 y.o. male with a history of IDDM, HTN, HPL, long-standing tobacco abuse and recurrent scrotal abscess, presented for evaluation of CAD after a chest CT showed calcified LM and 3V CAD with aortic atherosclerosis. He denies angina, but has had increasing fatigue, dyspnea and low energy level. Due to concern about balanced ischemia with nuclear stress testing, he was referred directly for cardiac catheterization.  Consent: The procedure with Risks/Benefits/Alternatives and Indications was reviewed with the patient (and family). All questions were answered.  Risks / Complications include, but not limited to: Death, MI, CVA/TIA, VF/VT (with defibrillation), Bradycardia (need for temporary pacer placement), contrast induced nephropathy, bleeding / bruising / hematoma / pseudoaneurysm, vascular or coronary injury (with possible emergent CT or Vascular Surgery), adverse medication reactions, infection.  The patient (and family) voice understanding and agree to proceed.  Risks of procedure as well as the alternatives and risks of each were explained to the (patient/caregiver). Consent for procedure obtained.  Consent for signed by MD and patient with RN witness -- placed on chart.  Procedure: The patient  was brought to the 2nd Floor [facility] in the fasting state and prepped and draped in the usual sterile fashion for (Right groin or radial) access. A modified Allen's test with plethysmography was performed on the right wrist demonstrating adequate Ulnar Artery collateral flow.  Sterile technique was used including antiseptics, cap, gloves, gown, hand hygiene, mask and sheet.  Skin prep: Chlorhexidine;  Time Out: Verified patient identification, verified procedure, site/side was marked, verified correct patient position, special equipment/implants available, medications/allergies/relevent history reviewed, required imaging and test results available. Performed  An attempt was made to cannulate the right radial artery, however, unsuccessfully. Attention was then turned to the femoral artery. The right femoral head was identified using tactile and fluoroscopic technique. The right groin was anesthetized with 1% subcutaneous Lidocaine. The right Common Femoral Artery was accessed using the Modified Seldinger Technique with placement of (5 Fr) sheath using the Seldinger technique. The sheath was aspirated and flushed. A 5 Fr JL4 Catheter was advanced of over a Standard  J wire into the ascending Aorta. The catheter was used to engage the left coronary artery. Multiple cineangiographic views of the left coronary artery system(s) were performed. A 5 Fr JR4 Catheter was advanced of over a Safety J wire into the ascending Aorta. The catheter was used to engage the right coronary artery and a non-selective shot of the native LIMA was obtained. Multiple cineangiographic views of the right coronary artery system(s) were performed. This catheter was then exchanged over the Standard J wire for an angled Pigtail catheter that was advanced across the Aortic Valve. LV hemodynamics were measured (and) Left Ventriculography was performed. LV hemodynamics were then re-sampled, and the catheter was  pulled back across the Aortic Valve for measurement of "pull-back" gradient. The catheter and the wire was removed completely out of the body.  The patient was transferred to the holding area where the sheath was removed with manual pressure held for hemostasis.  The patient was transported to the cath lab holding area in stable condition.  The patient was stable before, during and following the procedure.  Patient did tolerate procedure well.  There were not complications.  EBL: <10 cc  Medications:  Premedication: none  Sedation: 3 mg IV Versed, 50 IV mcg Fentanyl  Contrast: 100 cc Omnipaque  Hemodynamics:  Central Aortic Pressure / Mean Aortic Pressure: 153/82  LV Pressure / LV End diastolic Pressure: 15  Left Ventriculography:  EF: 60-65%  Wall Motion: normal  Coronary Angiographic Data:  Left Main: 4.0 vessel, tapers to 2.0 distally with heavy eccentric calcification (50% stenosis)  Left Anterior Descending (LAD): There is a tubular 50-60% proximal LAD stenosis, however, the distal vessel is preserved and reaches around the apex.  1st diagonal (D1): Moderate sized vessel with 80% ostial stenosis.  Circumflex (LCx): There is 80% calcified ostial stenosis of the LCX - the vessel appears to abruptly occluded in the mid-distal segment, with numerous OM vessels and collaterals thereafter.  Ramus Intermedius: True ramus vessel with moderate proximal disease  Right Coronary Artery: Dominant vessel. Heavily calcified throughout. There is a proximal 80% stenosis and a distal 90% stenosis prior to the genu.  posterior descending artery: extensive distribution, 2 large branches, mild to moderate ostial disease of the first branch.  posterior lateral branch: No significant stenoses. LIMA - Non-selective visualization of the LIMA demonstrates it may be suitable for bypass.  Impression:  1. Calcified 50% distal LM stenosis.  2. Severe ostial LCx stenosis with mid to distal vessel occlusion.  3.  Severe Proximal and distal RCA stenoses which are heavily calcified.  4. Tubular moderate proximal LAD disease.  5. Normal LV function.  Plan:  1. Multivessel and LM disease, heavily calcified in a diabetic. CABG evaluation is warranted.  2. Asked TCTS to evaluate - probably okay for discharge, but would be good to revascularize sooner than later.  The case and results was discussed with the patient (and family).  The case and results was not discussed with the patient's PCP.  The case and results was discussed with the patient's Cardiologist.  Time Spend Directly with Patient:  45 minutes  Chrystie Nose, MD, [facility]  Attending Cardiologist  [facility]  HILTY,Kenneth C  08/19/2012, 12:18 PM  Recent Lab Findings:   Lab Results  Component Value Date   HGBA1C 10.7* 08/20/2012   Lab Results  Component Value Date   WBC 10.1 08/20/2012   HGB 17.6* 08/20/2012   HCT 48.9 08/20/2012   PLT 158  08/20/2012   GLUCOSE 258* 08/20/2012   ALT 33 08/20/2012   AST 22 08/20/2012   NA 135 08/20/2012   K 4.3 08/20/2012   CL 96 08/20/2012   CREATININE 0.62 08/20/2012   BUN 15 08/20/2012   CO2 26 08/20/2012   TSH 1.762 01/17/2012   INR 0.92 08/20/2012   HGBA1C 10.7* 08/20/2012    Lab Results   Component  Value  Date    WBC  11.4*  12/25/2011    HGB  16.2  12/25/2011    HCT  45.3  12/25/2011    PLT  175  12/25/2011    GLUCOSE  289*  12/25/2011    ALT  21  12/25/2011    AST  16  12/25/2011    NA  131*  12/25/2011    K  3.6  12/25/2011    CL  89*  12/25/2011    CREATININE  0.60  12/25/2011    BUN  17  12/25/2011    CO2  29  12/25/2011    TSH  1.762  01/17/2012    No results found for this basename: HGBA1C   Assessment / Plan:  52 year old diabetic male with long-standing tobacco abuse, alcohol abuse, and poorly controlled diabetes who presents with cardiac catheterization was significant 3 vessel coronary artery disease including left main obstruction.  In addition the patient has  COPD PFTs are pending  Long-standing diabetes  Hypertension  Hyperlipidemia  I discussed with the patient the cardiac catheterization films and agree with Dr. Rennis Golden being diabetic and with 3 vessel coronary artery disease including left main obstruction coronary artery bypass grafting offers the best treatment option. I discussed with the patient in detail the need for them to stop smoking, stop consuming alcohol which is obviously aggravating his diabetic control, and needs better control of his blood glucose, which was 289 on admission today.  We plan to proceed with coronary artery bypass grafting today, the risks and options of treatment have been discussed with the patient in detail including the increased risk of infection and respiratory problems postoperatively because of his associated medical problems.    The goals risks and alternatives of the planned surgical procedure CABG have been discussed with the patient in detail. The risks of the procedure including death, infection, stroke, myocardial infarction, bleeding, blood transfusion have all been discussed specifically.  I have quoted August Luz a 5 % of perioperative mortality and a complication rate as high as 30 %. The patient's questions have been answered.August Luz is willing  to proceed with the planned procedure.    Delight Ovens MD  Beeper [phone] Office [phone] 08/22/2012 7:09 AM

## 2012-08-22 NOTE — Progress Notes (Signed)
  Echocardiogram Echocardiogram Transesophageal has been performed.  [name] A 08/22/2012, 8:25 AM

## 2012-08-22 NOTE — Progress Notes (Signed)
Spoke with Dr. Sampson Goon, reported that pt. CBG is 302 this a.m. & that he didn't take insulin last p.m. No new orders rec'd .

## 2012-08-22 NOTE — Progress Notes (Signed)
Patient ID: [name], male   DOB: May 30, 1961, 52 y.o.   MRN: [id number]                   [address]            [place] [address]          [phone]     Day of Surgery Procedure(s) (LRB): CORONARY ARTERY BYPASS GRAFTING (CABG) (N/A) INTRAOPERATIVE TRANSESOPHAGEAL ECHOCARDIOGRAM (N/A)  Total Length of Stay:  LOS: 0 days  BP 94/63  Pulse 79  Temp 98.1 F (36.7 C) (Oral)  Resp 18  Ht 5\' 11"  (1.803 m)  Wt 181 lb 6.4 oz (82.283 kg)  BMI 25.30 kg/m2  SpO2 98%  .Intake/Output      01/16 0701 - 01/17 0700 01/17 0701 - 01/18 0700   I.V. (mL/kg)  3423.9 (41.6)   Blood  571   IV Piggyback  650   Total Intake(mL/kg)  4644.9 (56.5)   Urine (mL/kg/hr)  2355 (2.5)   Blood  1400   Chest Tube  130   Total Output  3885   Net  +759.9             . sodium chloride 20 mL/hr at 08/22/12 1700  . sodium chloride 10 mL/hr at 08/22/12 1700  . dexmedetomidine 0.2 mcg/kg/hr (08/22/12 1746)  . DOPamine Stopped (08/22/12 1315)  . insulin (NOVOLIN-R) infusion 0.8 mL/hr at 08/22/12 1700  . lactated ringers 20 mL/hr at 08/22/12 1700  . nitroGLYCERIN Stopped (08/22/12 1315)  . phenylephrine (NEO-SYNEPHRINE) Adult infusion 20 mcg/min (08/22/12 1700)     Lab Results  Component Value Date   WBC 15.5* 08/22/2012   HGB 13.1 08/22/2012   HGB 13.3 08/22/2012   HCT 37.2* 08/22/2012   HCT 39.0 08/22/2012   PLT 119* 08/22/2012   GLUCOSE 143* 08/22/2012   ALT 33 08/20/2012   AST 22 08/20/2012   NA 143 08/22/2012   K 3.6 08/22/2012   CL 96 08/20/2012   CREATININE 0.62 08/20/2012   BUN 15 08/20/2012   CO2 26 08/20/2012   TSH 1.762 01/17/2012   INR 1.28 08/22/2012   HGBA1C 10.7* 08/20/2012   On insulin drip last glocose 116 Not bleeding    Delight Ovens MD  Beeper [phone] Office [phone] 08/22/2012 6:14 PM

## 2012-08-22 NOTE — Progress Notes (Signed)
Utilization Review Completed.Hindy Perrault T11/02/2013  

## 2012-08-22 NOTE — Anesthesia Preprocedure Evaluation (Addendum)
Anesthesia Evaluation  Patient identified by MRN, date of birth, ID band Patient awake    Reviewed: Allergy & Precautions, H&P , NPO status , Patient's Chart, lab work & pertinent test results  Airway       Dental   Pulmonary COPD         Cardiovascular hypertension, + angina     Neuro/Psych    GI/Hepatic hiatal hernia, GERD-  ,  Endo/Other  diabetes  Renal/GU      Musculoskeletal   Abdominal   Peds  Hematology   Anesthesia Other Findings   Reproductive/Obstetrics                          Anesthesia Physical Anesthesia Plan  ASA: IV  Anesthesia Plan: General   Post-op Pain Management:    Induction: Intravenous  Airway Management Planned: Oral ETT  Additional Equipment:   Intra-op Plan:   Post-operative Plan: Post-operative intubation/ventilation  Informed Consent: I have reviewed the patients History and Physical, chart, labs and discussed the procedure including the risks, benefits and alternatives for the proposed anesthesia with the patient or authorized representative who has indicated his/her understanding and acceptance.     Plan Discussed with: CRNA, Anesthesiologist and Surgeon  Anesthesia Plan Comments:         Anesthesia Quick Evaluation

## 2012-08-22 NOTE — Progress Notes (Signed)
Pt violently coughing, bp 180 systolic, unable to calm patient, asynchronus with ventilator support, arms flailing about reaching for ETT, versed 2mg  IV given and precedex restarted at 0.2 for extreme agitation. Will continue to monitor closely.

## 2012-08-22 NOTE — Preoperative (Signed)
Beta Blockers   Reason not to administer Beta Blockers:Not Applicable 

## 2012-08-22 NOTE — Procedures (Signed)
Extubation Procedure Note  Patient Details:   Name: [name] DOB: 15-Jul-1961 MRN: [id number]   Airway Documentation:  Airway 8 mm (Active)  Secured at (cm) 22 cm 08/22/2012  8:00 PM  Measured From Lips 08/22/2012  8:00 PM  Secured Location Center 08/22/2012  7:36 PM  Secured By Caron Presume Tape 08/22/2012  7:36 PM   NIF -60, VC 1.4L.  Good patient effort, best of three attempts.  Patient extubated to 4L [place].  IS started.  Evaluation  O2 sats: stable throughout Complications: No apparent complications Patient did tolerate procedure well. Bilateral Breath Sounds: Clear   Yes  [name] 08/22/2012, 10:12 PM

## 2012-08-22 NOTE — OR Nursing (Signed)
12:15pm 1st call to SICU, call to vol. Desk to inform family off pump.. 2nd call to SICU @ 12:45pm

## 2012-08-22 NOTE — Brief Op Note (Addendum)
08/22/2012  11:15 AM  PATIENT:  [name]  52 y.o. male  PRE-OPERATIVE DIAGNOSIS:  CAD with left main disease  POST-OPERATIVE DIAGNOSIS:  same  PROCEDURE:  INTRAOPERATIVE TRANSESOPHAGEAL ECHOCARDIOGRAM , MEDIAN STERNOTOMY for CORONARY ARTERY BYPASS GRAFTING (CABG) x 4 (LIMA to LAD, SVG to Diagonal1, SVG to RAMUS INTERMEDIATE, and SVG to RCA)  using left internal mammary artery and EVH from LEFT THIGH and LOWER LEG    SURGEON:  Surgeon(s) and Role:    * Delight Ovens, MD - Primary  PHYSICIAN ASSISTANT: Doree Fudge PA-C   ANESTHESIA:   general  EBL:  Total I/O In: -  Out: 700 [Urine:700]   DRAINS:  Chest Tube(s) in the Mediastinal and pleural spaces    COUNTS CORRECT:  YES  DICTATION: .Dragon Dictation  PLAN OF CARE: Admit to inpatient   PATIENT DISPOSITION:  ICU - intubated and hemodynamically stable.   Delay start of Pharmacological VTE agent (>24hrs) due to surgical blood loss or risk of bleeding: yes  PRE OP WEIGHT: 82 kg

## 2012-08-23 ENCOUNTER — Inpatient Hospital Stay (HOSPITAL_COMMUNITY): Payer: Medicare Other

## 2012-08-23 DIAGNOSIS — I739 Peripheral vascular disease, unspecified: Secondary | ICD-10-CM | POA: Diagnosis present

## 2012-08-23 DIAGNOSIS — Z951 Presence of aortocoronary bypass graft: Secondary | ICD-10-CM

## 2012-08-23 LAB — GLUCOSE, CAPILLARY
Glucose-Capillary: 123 mg/dL — ABNORMAL HIGH (ref 70–99)
Glucose-Capillary: 169 mg/dL — ABNORMAL HIGH (ref 70–99)
Glucose-Capillary: 132 mg/dL — ABNORMAL HIGH (ref 70–99)
Glucose-Capillary: 140 mg/dL — ABNORMAL HIGH (ref 70–99)
Glucose-Capillary: 168 mg/dL — ABNORMAL HIGH (ref 70–99)

## 2012-08-23 LAB — CREATININE, SERUM
GFR calc Af Amer: >90 mL/min (ref >90)
GFR calc non Af Amer: >90 mL/min (ref >90)

## 2012-08-23 LAB — POCT I-STAT 3, ART BLOOD GAS (G3+)
Acid-base deficit: 2 mmol/L (ref 0.0–2.0)
Bicarbonate: 24.3 mEq/L — ABNORMAL HIGH (ref 20.0–24.0)
O2 Saturation: 96 %
Patient temperature: 38.3
TCO2: 26 mmol/L (ref 0–100)
pCO2 arterial: 47.9 mmHg — ABNORMAL HIGH (ref 35.0–45.0)
pH, Arterial: 7.319 — ABNORMAL LOW (ref 7.350–7.450)
pO2, Arterial: 96 mmHg (ref 80.0–100.0)

## 2012-08-23 LAB — CBC
HCT: 33.4 % — ABNORMAL LOW (ref 39.0–52.0)
HCT: 37.2 % — ABNORMAL LOW (ref 39.0–52.0)
MCH: 31.9 pg (ref 26.0–34.0)
MCHC: 34.4 g/dL (ref 30.0–36.0)
MCV: 92.5 fL (ref 78.0–100.0)
MCV: 93.2 fL (ref 78.0–100.0)
Platelets: 95 10*3/uL — ABNORMAL LOW (ref 150–400)
RBC: 3.99 MIL/uL — ABNORMAL LOW (ref 4.22–5.81)
RDW: 12.2 % (ref 11.5–15.5)
RDW: 12.2 % (ref 11.5–15.5)
WBC: 13.8 10*3/uL — ABNORMAL HIGH (ref 4.0–10.5)

## 2012-08-23 LAB — POCT I-STAT, CHEM 8
BUN: 5 mg/dL — ABNORMAL LOW (ref 6–23)
Calcium, Ion: 1.21 mmol/L (ref 1.12–1.23)
Chloride: 100 mEq/L (ref 96–112)
Creatinine, Ser: 0.8 mg/dL (ref 0.50–1.35)
Glucose, Bld: 121 mg/dL — ABNORMAL HIGH (ref 70–99)
HCT: 38 % — ABNORMAL LOW (ref 39.0–52.0)
Hemoglobin: 12.9 g/dL — ABNORMAL LOW (ref 13.0–17.0)
Potassium: 3.8 mEq/L (ref 3.5–5.1)
Sodium: 138 mEq/L (ref 135–145)
TCO2: 28 mmol/L (ref 0–100)

## 2012-08-23 LAB — BASIC METABOLIC PANEL
BUN: 8 mg/dL (ref 6–23)
Calcium: 7.9 mg/dL — ABNORMAL LOW (ref 8.4–10.5)
Creatinine, Ser: 0.6 mg/dL (ref 0.50–1.35)
GFR calc Af Amer: >90 mL/min (ref >90)
GFR calc non Af Amer: >90 mL/min (ref >90)

## 2012-08-23 LAB — MAGNESIUM: Magnesium: 2.1 mg/dL (ref 1.5–2.5)

## 2012-08-23 MED ORDER — FUROSEMIDE 10 MG/ML IJ SOLN
40.0000 mg | Freq: Once | INTRAMUSCULAR | Status: AC
  Administered 2012-08-23: 40 mg via INTRAVENOUS
  Filled 2012-08-23: qty 4

## 2012-08-23 MED ORDER — INSULIN ASPART 100 UNIT/ML ~~LOC~~ SOLN
0.0000 [IU] | Freq: Three times a day (TID) | SUBCUTANEOUS | Status: DC
  Administered 2012-08-23: 4 [IU] via SUBCUTANEOUS

## 2012-08-23 MED ORDER — ENOXAPARIN SODIUM 30 MG/0.3ML ~~LOC~~ SOLN
40.0000 mg | Freq: Every day | SUBCUTANEOUS | Status: DC
  Administered 2012-08-23: 40 mg via SUBCUTANEOUS
  Filled 2012-08-23 (×2): qty 0.4

## 2012-08-23 MED ORDER — INSULIN DETEMIR 100 UNIT/ML ~~LOC~~ SOLN
25.0000 [IU] | Freq: Every day | SUBCUTANEOUS | Status: DC
  Administered 2012-08-23 – 2012-08-24 (×2): 25 [IU] via SUBCUTANEOUS

## 2012-08-23 MED ORDER — INSULIN ASPART 100 UNIT/ML ~~LOC~~ SOLN
0.0000 [IU] | SUBCUTANEOUS | Status: DC
  Administered 2012-08-23: 4 [IU] via SUBCUTANEOUS
  Administered 2012-08-23: 2 [IU] via SUBCUTANEOUS

## 2012-08-23 NOTE — Progress Notes (Signed)
Patient ID: [name], male   DOB: October 12, 1960, 52 y.o.   MRN: [id number] [facility] DAILY PROGRESS NOTE                   [address]            [place] [id number]          [id number]      1 Day Post-Op Procedure(s) (LRB): CORONARY ARTERY BYPASS GRAFTING (CABG) (N/A) INTRAOPERATIVE TRANSESOPHAGEAL ECHOCARDIOGRAM (N/A)  Total Length of Stay:  LOS: 1 day   Subjective: Extubated, awake and alert  Objective: Vital signs in last 24 hours: Temp:  [96.8 F (36 C)-100.9 F (38.3 C)] 99 F (37.2 C) (01/18 0900) Pulse Rate:  [61-99] 77  (01/18 0900) Cardiac Rhythm:  [-] Normal sinus rhythm (01/18 0900) Resp:  [10-21] 15  (01/18 0900) BP: (87-136)/(51-71) 99/62 mmHg (01/18 0900) SpO2:  [94 %-99 %] 97 % (01/18 0900) Arterial Line BP: (95-190)/(45-77) 132/51 mmHg (01/18 0900) FiO2 (%):  [39.3 %-51 %] 40 % (01/17 2200) Weight:  [192 lb 10.9 oz (87.4 kg)] 192 lb 10.9 oz (87.4 kg) (01/18 0500)  Filed Weights   08/21/12 1500 08/23/12 0500  Weight: 181 lb 6.4 oz (82.283 kg) 192 lb 10.9 oz (87.4 kg)    Weight change: 11 lb 4.5 oz (5.117 kg)   Hemodynamic parameters for last 24 hours: PAP: (16-42)/(7-25) 24/14 mmHg CO:  [3.6 L/min-6.8 L/min] 5.6 L/min CI:  [1.8 L/min/m2-3.4 L/min/m2] 2.8 L/min/m2  Intake/Output from previous day: 01/17 0701 - 01/18 0700 In: 6590.2 [I.V.:4369.2; Blood:571; IV Piggyback:1650] Out: 5460 [Urine:3450; Blood:1400; Chest Tube:610]  Intake/Output this shift: Total I/O In: 155.9 [I.V.:155.9] Out: 700 [Urine:650; Chest Tube:50]  Current Meds: Scheduled Meds:   . acetaminophen  1,000 mg Oral Q6H   Or  . acetaminophen (TYLENOL) oral liquid 160 mg/5 mL  975 mg Per Tube Q6H  . albuterol-ipratropium  2 puff Inhalation Q4H  . aspirin EC  325 mg Oral Daily   Or  . aspirin  324 mg Per Tube Daily  . bisacodyl  10 mg Oral Daily   Or  . bisacodyl  10 mg Rectal Daily  . cefUROXime (ZINACEF)  IV  1.5 g Intravenous Q12H  . docusate sodium   200 mg Oral Daily  . DULoxetine  60 mg Oral Daily  . enoxaparin  40 mg Subcutaneous QHS  . famotidine (PEPCID) IV  20 mg Intravenous Q12H  . folic acid  1 mg Oral Daily  . insulin aspart  0-24 Units Subcutaneous Q4H  . insulin detemir  25 Units Subcutaneous Q1200  . insulin regular  0-10 Units Intravenous TID WC  . metoprolol tartrate  12.5 mg Oral BID   Or  . metoprolol tartrate  12.5 mg Per Tube BID  . pantoprazole  40 mg Oral Daily  . simvastatin  40 mg Oral QPC breakfast  . sodium chloride  3 mL Intravenous Q12H  . Tamsulosin HCl  0.4 mg Oral Daily  . thiamine  100 mg Oral Daily   Continuous Infusions:   . sodium chloride 20 mL/hr at 08/22/12 1900  . sodium chloride 10 mL/hr at 08/22/12 1900  . dexmedetomidine Stopped (08/22/12 2000)  . DOPamine Stopped (08/22/12 1315)  . insulin (NOVOLIN-R) infusion 1.2 mL/hr at 08/23/12 0900  . lactated ringers 20 mL/hr at 08/23/12 0600  . nitroGLYCERIN Stopped (08/23/12 0630)  . phenylephrine (NEO-SYNEPHRINE) Adult infusion Stopped (08/22/12 2000)   PRN Meds:.sodium chloride, albumin human, levalbuterol,  metoprolol, midazolam, morphine injection, ondansetron (ZOFRAN) IV, oxyCODONE, sodium chloride  General appearance: alert, cooperative and no distress Neurologic: intact Heart: regular rate and rhythm, S1, S2 normal, no murmur, click, rub or gallop and normal apical impulse Lungs: diminished breath sounds bibasilar Abdomen: soft, non-tender; bowel sounds normal; no masses,  no organomegaly Extremities: extremities normal, atraumatic, no cyanosis or edema and Homans sign is negative, no sign of DVT Wound: sternum stable  Lab Results: CBC: Basename 08/23/12 0400 08/22/12 1915  WBC 12.2* 15.8*  HGB 11.5* 13.4  HCT 33.4* 38.3*  PLT 100* 124*   BMET:  Basename 08/23/12 0400 08/22/12 1915 08/22/12 1901 08/20/12 1342  NA 138 -- 142 --  K 3.9 -- 4.1 --  CL 105 -- 106 --  CO2 25 -- -- 26  GLUCOSE 114* -- 113* --  BUN 8 -- 9 --    CREATININE 0.60 0.61 -- --  CALCIUM 7.9* -- -- 9.9    PT/INR:  Basename 08/22/12 1330  LABPROT 15.7*  INR 1.28   Radiology: Dg Chest Portable 1 View In Am  08/23/2012  *RADIOLOGY REPORT*  Clinical Data: Chest pain and congestion.  PORTABLE CHEST - 1 VIEW  Comparison: 08/22/2012  Findings: Postoperative changes in the mediastinum and cervical spine are stable.  Interval removal of the endotracheal tube and enteric tube.  Swan-Ganz catheter, mediastinal drain, and left chest tube remain unchanged.  There appears to be atelectasis in the left lung base.  No residual or recurrent pneumothorax.  The right lung is clear.  IMPRESSION: Atelectasis in the left lung base.  Interval removal of endotracheal and enteric tubes.  Appliances are otherwise stable in position.   Original Report Authenticated By: Burman Nieves, M.D.    Dg Chest Portable 1 View  08/22/2012  *RADIOLOGY REPORT*  Clinical Data: Postop CABG.  PORTABLE CHEST - 1 VIEW  Comparison: 08/20/2012  Findings: 2.  Frontal views.  Endotracheal tube terminates 4.9 cm above carina.  Nasogastric tube extends beyond the  inferior aspect of the film.  Left-sided chest tube.  Right internal jugular Swan- Ganz catheter terminates at the pulmonary outflow tract or proximal most right pulmonary artery.  Interval median sternotomy. Normal heart size.  No pleural effusion or pneumothorax.  No congestive failure.  Clear lungs.  Mildly low lung volumes.  IMPRESSION: Interval median sternotomy, without acute disease.  Appropriate position of support apparatus, without pneumothorax.   Original Report Authenticated By: Jeronimo Greaves, M.D.      Assessment/Plan: S/P Procedure(s) (LRB): CORONARY ARTERY BYPASS GRAFTING (CABG) (N/A) INTRAOPERATIVE TRANSESOPHAGEAL ECHOCARDIOGRAM (N/A) Mobilize Diuresis Diabetes control, start levamir and decrease insulin drip d/c tubes/lines Continue foley due to strict I&O, patient in ICU and urinary output monitoring See  progression orders Expected Acute  Blood - loss Anemia     Lillyian Heidt B 08/23/2012 9:26 AM

## 2012-08-23 NOTE — Progress Notes (Signed)
[facility]  DAILY PROGRESS NOTE   Subjective:  No events overnight. POD #1. Glucose improved with treatment. Off of inotropes. Maintaining sinus rhythm.  Objective:  Temp:  [96.8 F (36 C)-100.9 F (38.3 C)] 98.8 F (37.1 C) (01/18 0645) Pulse Rate:  [61-99] 76  (01/18 0801) Resp:  [10-21] 14  (01/18 0801) BP: (87-136)/(51-71) 126/51 mmHg (01/18 0801) SpO2:  [94 %-99 %] 97 % (01/18 0801) Arterial Line BP: (95-190)/(45-77) 105/47 mmHg (01/18 0645) FiO2 (%):  [39.3 %-51 %] 40 % (01/17 2200) Weight:  [87.4 kg (192 lb 10.9 oz)] 87.4 kg (192 lb 10.9 oz) (01/18 0500) Weight change: 5.117 kg (11 lb 4.5 oz)  Intake/Output from previous day: 01/17 0701 - 01/18 0700 In: 6590.2 [I.V.:4369.2; Blood:571; IV Piggyback:1650] Out: 5460 [Urine:3450; Blood:1400; Chest Tube:610]  Intake/Output from this shift:    Medications: Current Facility-Administered Medications  Medication Dose Route Frequency Provider Last Rate Last Dose  . 0.45 % sodium chloride infusion   Intravenous Continuous Ardelle Balls, PA 20 mL/hr at 08/22/12 1900    . 0.9 %  sodium chloride infusion   Intravenous Continuous Ardelle Balls, PA 10 mL/hr at 08/22/12 1900    . 0.9 %  sodium chloride infusion  250 mL Intravenous PRN Ardelle Balls, PA      . acetaminophen (TYLENOL) tablet 1,000 mg  1,000 mg Oral Q6H Ardelle Balls, PA   1,000 mg at 08/23/12 0542   Or  . acetaminophen (TYLENOL) solution 975 mg  975 mg Per Tube Q6H Donielle Margaretann Loveless, PA      . albumin human 5 % solution 250 mL  250 mL Intravenous Q15 min PRN Ardelle Balls, PA   250 mL at 08/23/12 0329  . albuterol-ipratropium (COMBIVENT) inhaler 2 puff  2 puff Inhalation Q4H Ardelle Balls, PA   2 puff at 08/23/12 0800  . aspirin EC tablet 325 mg  325 mg Oral Daily Ardelle Balls, PA       Or  . aspirin chewable tablet 324 mg  324 mg Per Tube Daily Donielle Margaretann Loveless, PA      . bisacodyl  (DULCOLAX) EC tablet 10 mg  10 mg Oral Daily Ardelle Balls, PA       Or  . bisacodyl (DULCOLAX) suppository 10 mg  10 mg Rectal Daily Donielle Margaretann Loveless, PA      . cefUROXime (ZINACEF) 1.5 g in dextrose 5 % 50 mL IVPB  1.5 g Intravenous Q12H Ardelle Balls, PA   1.5 g at 08/23/12 0022  . dexmedetomidine (PRECEDEX) 200 MCG/50ML infusion  0.1-0.7 mcg/kg/hr Intravenous Continuous Ardelle Balls, PA   0.2 mcg/kg/hr at 08/22/12 1900  . docusate sodium (COLACE) capsule 200 mg  200 mg Oral Daily Ardelle Balls, PA      . DOPamine (INTROPIN) 800 mg in dextrose 5 % 250 mL infusion  0-3 mcg/kg/min Intravenous Continuous Ardelle Balls, PA      . DULoxetine (CYMBALTA) DR capsule 60 mg  60 mg Oral Daily Ardelle Balls, PA      . famotidine (PEPCID) IVPB 20 mg  20 mg Intravenous Q12H Ardelle Balls, PA   20 mg at 08/22/12 1340  . folic acid (FOLVITE) tablet 1 mg  1 mg Oral Daily Donielle Margaretann Loveless, PA      . insulin regular (NOVOLIN R,HUMULIN R) 1 Units/mL in sodium chloride 0.9 % 100 mL infusion   Intravenous Continuous  Ardelle Balls, PA 0.9 mL/hr at 08/22/12 1900 0.9 Units/hr at 08/22/12 1900  . insulin regular bolus via infusion 0-10 Units  0-10 Units Intravenous TID WC Donielle Margaretann Loveless, PA      . lactated ringers infusion   Intravenous Continuous Ardelle Balls, PA 20 mL/hr at 08/23/12 0600    . levalbuterol (XOPENEX) nebulizer solution 0.63 mg  0.63 mg Nebulization Q8H PRN Ardelle Balls, PA      . metoprolol (LOPRESSOR) injection 2.5-5 mg  2.5-5 mg Intravenous Q2H PRN Ardelle Balls, PA   5 mg at 08/23/12 0300  . metoprolol tartrate (LOPRESSOR) tablet 12.5 mg  12.5 mg Oral BID Ardelle Balls, PA   12.5 mg at 08/22/12 2252   Or  . metoprolol tartrate (LOPRESSOR) 25 mg/10 mL oral suspension 12.5 mg  12.5 mg Per Tube BID Ardelle Balls, PA      . midazolam (VERSED) injection 2 mg  2 mg Intravenous Q1H PRN Ardelle Balls, PA   2 mg at 08/22/12 1743  . morphine 2 MG/ML injection 2-5 mg  2-5 mg Intravenous Q1H PRN Ardelle Balls, PA   2 mg at 08/23/12 0542  . nitroGLYCERIN 0.2 mg/mL in dextrose 5 % infusion  0-100 mcg/min Intravenous Continuous Ardelle Balls, PA   10 mcg/min at 08/23/12 0615  . ondansetron (ZOFRAN) injection 4 mg  4 mg Intravenous Q6H PRN Ardelle Balls, PA      . oxyCODONE (Oxy IR/ROXICODONE) immediate release tablet 5-10 mg  5-10 mg Oral Q3H PRN Ardelle Balls, PA   10 mg at 08/23/12 0451  . pantoprazole (PROTONIX) EC tablet 40 mg  40 mg Oral Daily Donielle Margaretann Loveless, PA      . phenylephrine (NEO-SYNEPHRINE) 20,000 mcg in dextrose 5 % 250 mL infusion  0-100 mcg/min Intravenous Continuous Ardelle Balls, PA   10 mcg/min at 08/22/12 1945  . simvastatin (ZOCOR) tablet 40 mg  40 mg Oral QPC breakfast Ardelle Balls, PA      . sodium chloride 0.9 % injection 3 mL  3 mL Intravenous Q12H Donielle Margaretann Loveless, PA      . sodium chloride 0.9 % injection 3 mL  3 mL Intravenous PRN Ardelle Balls, PA      . Tamsulosin HCl (FLOMAX) capsule 0.4 mg  0.4 mg Oral Daily Donielle Margaretann Loveless, PA      . thiamine (VITAMIN B-1) tablet 100 mg  100 mg Oral Daily Ardelle Balls, PA        Physical Exam: General appearance: alert and no distress Neck: no adenopathy, no carotid bruit, no JVD, supple, symmetrical, trachea midline and thyroid not enlarged, symmetric, no tenderness/mass/nodules Lungs: clear to auscultation bilaterally Heart: regular rate and rhythm, S1, S2 normal, no murmur, click, rub or gallop Abdomen: soft, non-tender; bowel sounds normal; no masses,  no organomegaly Extremities: extremities normal, atraumatic, no cyanosis or edema Pulses: 2+ and symmetric  Lab Results: Results for orders placed during the hospital encounter of 08/22/12 (from the past 48 hour(s))  GLUCOSE, CAPILLARY     Status: Abnormal   Collection Time   08/22/12  5:47 AM       Component Value Range Comment   Glucose-Capillary 302 (*) 70 - 99 mg/dL    Comment 1 Notify RN     POCT I-STAT 4, (NA,K, GLUC, HGB,HCT)     Status: Abnormal   Collection Time   08/22/12  8:06 AM  Component Value Range Comment   Sodium 139  135 - 145 mEq/L    Potassium 3.6  3.5 - 5.1 mEq/L    Glucose, Bld 214 (*) 70 - 99 mg/dL    HCT 78.2  95.6 - 21.3 %    Hemoglobin 15.0  13.0 - 17.0 g/dL   POCT I-STAT 4, (NA,K, GLUC, HGB,HCT)     Status: Abnormal   Collection Time   08/22/12  9:38 AM      Component Value Range Comment   Sodium 140  135 - 145 mEq/L    Potassium 3.8  3.5 - 5.1 mEq/L    Glucose, Bld 118 (*) 70 - 99 mg/dL    HCT 08.6  57.8 - 46.9 %    Hemoglobin 13.9  13.0 - 17.0 g/dL   POCT I-STAT 3, BLOOD GAS (G3+)     Status: Abnormal   Collection Time   08/22/12 10:01 AM      Component Value Range Comment   pH, Arterial 7.317 (*) 7.350 - 7.450    pCO2 arterial 54.4 (*) 35.0 - 45.0 mmHg    pO2, Arterial 384.0 (*) 80.0 - 100.0 mmHg    Bicarbonate 27.8 (*) 20.0 - 24.0 mEq/L    TCO2 29  0 - 100 mmol/L    O2 Saturation 100.0      Acid-Base Excess 1.0  0.0 - 2.0 mmol/L    Sample type ARTERIAL     POCT I-STAT 4, (NA,K, GLUC, HGB,HCT)     Status: Abnormal   Collection Time   08/22/12 10:04 AM      Component Value Range Comment   Sodium 138  135 - 145 mEq/L    Potassium 3.4 (*) 3.5 - 5.1 mEq/L    Glucose, Bld 85  70 - 99 mg/dL    HCT 62.9 (*) 52.8 - 52.0 %    Hemoglobin 11.2 (*) 13.0 - 17.0 g/dL   HEMOGLOBIN AND HEMATOCRIT, BLOOD     Status: Abnormal   Collection Time   08/22/12 11:03 AM      Component Value Range Comment   Hemoglobin 12.3 (*) 13.0 - 17.0 g/dL DELTA CHECK NOTED   HCT 34.7 (*) 39.0 - 52.0 %   PLATELET COUNT     Status: Abnormal   Collection Time   08/22/12 11:03 AM      Component Value Range Comment   Platelets 143 (*) 150 - 400 K/uL   POCT I-STAT 4, (NA,K, GLUC, HGB,HCT)     Status: Abnormal   Collection Time   08/22/12 11:05 AM      Component Value  Range Comment   Sodium 139  135 - 145 mEq/L    Potassium 4.7  3.5 - 5.1 mEq/L    Glucose, Bld 103 (*) 70 - 99 mg/dL    HCT 41.3 (*) 24.4 - 52.0 %    Hemoglobin 11.9 (*) 13.0 - 17.0 g/dL   POCT I-STAT 4, (NA,K, GLUC, HGB,HCT)     Status: Abnormal   Collection Time   08/22/12 12:04 PM      Component Value Range Comment   Sodium 142  135 - 145 mEq/L    Potassium 3.4 (*) 3.5 - 5.1 mEq/L    Glucose, Bld 130 (*) 70 - 99 mg/dL    HCT 01.0 (*) 27.2 - 52.0 %    Hemoglobin 11.9 (*) 13.0 - 17.0 g/dL   CBC     Status: Abnormal   Collection Time   08/22/12  1:30  PM      Component Value Range Comment   WBC 15.5 (*) 4.0 - 10.5 K/uL    RBC 4.13 (*) 4.22 - 5.81 MIL/uL    Hemoglobin 13.1  13.0 - 17.0 g/dL    HCT 09.8 (*) 11.9 - 52.0 %    MCV 90.1  78.0 - 100.0 fL    MCH 31.7  26.0 - 34.0 pg    MCHC 35.2  30.0 - 36.0 g/dL    RDW 14.7  82.9 - 56.2 %    Platelets 119 (*) 150 - 400 K/uL PLATELET COUNT CONFIRMED BY SMEAR  PROTIME-INR     Status: Abnormal   Collection Time   08/22/12  1:30 PM      Component Value Range Comment   Prothrombin Time 15.7 (*) 11.6 - 15.2 seconds    INR 1.28  0.00 - 1.49   APTT     Status: Normal   Collection Time   08/22/12  1:30 PM      Component Value Range Comment   aPTT 32  24 - 37 seconds   POCT I-STAT 4, (NA,K, GLUC, HGB,HCT)     Status: Abnormal   Collection Time   08/22/12  1:30 PM      Component Value Range Comment   Sodium 143  135 - 145 mEq/L    Potassium 3.6  3.5 - 5.1 mEq/L    Glucose, Bld 143 (*) 70 - 99 mg/dL    HCT 13.0  86.5 - 78.4 %    Hemoglobin 13.3  13.0 - 17.0 g/dL   POCT I-STAT 3, BLOOD GAS (G3+)     Status: Abnormal   Collection Time   08/22/12  1:39 PM      Component Value Range Comment   pH, Arterial 7.315 (*) 7.350 - 7.450    pCO2 arterial 50.3 (*) 35.0 - 45.0 mmHg    pO2, Arterial 90.0  80.0 - 100.0 mmHg    Bicarbonate 25.9 (*) 20.0 - 24.0 mEq/L    TCO2 27  0 - 100 mmol/L    O2 Saturation 96.0      Acid-base deficit 1.0  0.0 - 2.0  mmol/L    Patient temperature 36.0 C      Sample type ARTERIAL     GLUCOSE, CAPILLARY     Status: Abnormal   Collection Time   08/22/12  2:45 PM      Component Value Range Comment   Glucose-Capillary 120 (*) 70 - 99 mg/dL   GLUCOSE, CAPILLARY     Status: Abnormal   Collection Time   08/22/12  3:43 PM      Component Value Range Comment   Glucose-Capillary 112 (*) 70 - 99 mg/dL   GLUCOSE, CAPILLARY     Status: Abnormal   Collection Time   08/22/12  4:45 PM      Component Value Range Comment   Glucose-Capillary 102 (*) 70 - 99 mg/dL   GLUCOSE, CAPILLARY     Status: Abnormal   Collection Time   08/22/12  5:57 PM      Component Value Range Comment   Glucose-Capillary 116 (*) 70 - 99 mg/dL   GLUCOSE, CAPILLARY     Status: Abnormal   Collection Time   08/22/12  6:55 PM      Component Value Range Comment   Glucose-Capillary 107 (*) 70 - 99 mg/dL   POCT I-STAT, CHEM 8     Status: Abnormal   Collection Time   08/22/12  7:01 PM      Component Value Range Comment   Sodium 142  135 - 145 mEq/L    Potassium 4.1  3.5 - 5.1 mEq/L    Chloride 106  96 - 112 mEq/L    BUN 9  6 - 23 mg/dL    Creatinine, Ser 1.61  0.50 - 1.35 mg/dL    Glucose, Bld 096 (*) 70 - 99 mg/dL    Calcium, Ion 0.45  4.09 - 1.23 mmol/L    TCO2 25  0 - 100 mmol/L    Hemoglobin 12.9 (*) 13.0 - 17.0 g/dL    HCT 81.1 (*) 91.4 - 52.0 %   CBC     Status: Abnormal   Collection Time   08/22/12  7:15 PM      Component Value Range Comment   WBC 15.8 (*) 4.0 - 10.5 K/uL    RBC 4.20 (*) 4.22 - 5.81 MIL/uL    Hemoglobin 13.4  13.0 - 17.0 g/dL    HCT 78.2 (*) 95.6 - 52.0 %    MCV 91.2  78.0 - 100.0 fL    MCH 31.9  26.0 - 34.0 pg    MCHC 35.0  30.0 - 36.0 g/dL    RDW 21.3  08.6 - 57.8 %    Platelets 124 (*) 150 - 400 K/uL   CREATININE, SERUM     Status: Normal   Collection Time   08/22/12  7:15 PM      Component Value Range Comment   Creatinine, Ser 0.61  0.50 - 1.35 mg/dL    GFR calc non Af Amer >90  >90 mL/min    GFR calc Af  Amer >90  >90 mL/min   MAGNESIUM     Status: Abnormal   Collection Time   08/22/12  7:15 PM      Component Value Range Comment   Magnesium 2.9 (*) 1.5 - 2.5 mg/dL   POCT I-STAT 3, BLOOD GAS (G3+)     Status: Abnormal   Collection Time   08/22/12  7:34 PM      Component Value Range Comment   pH, Arterial 7.199 (*) 7.350 - 7.450    pCO2 arterial 68.9 (*) 35.0 - 45.0 mmHg    pO2, Arterial 94.0  80.0 - 100.0 mmHg    Bicarbonate 26.7 (*) 20.0 - 24.0 mEq/L    TCO2 29  0 - 100 mmol/L    O2 Saturation 95.0      Acid-base deficit 3.0 (*) 0.0 - 2.0 mmol/L    Patient temperature 37.4 C      Sample type ARTERIAL      Comment MD NOTIFIED, REPEAT TEST     GLUCOSE, CAPILLARY     Status: Abnormal   Collection Time   08/22/12  7:50 PM      Component Value Range Comment   Glucose-Capillary 126 (*) 70 - 99 mg/dL   GLUCOSE, CAPILLARY     Status: Abnormal   Collection Time   08/22/12  8:58 PM      Component Value Range Comment   Glucose-Capillary 123 (*) 70 - 99 mg/dL   POCT I-STAT 3, BLOOD GAS (G3+)     Status: Abnormal   Collection Time   08/22/12 10:04 PM      Component Value Range Comment   pH, Arterial 7.319 (*) 7.350 - 7.450    pCO2 arterial 47.9 (*) 35.0 - 45.0 mmHg    pO2, Arterial 96.0  80.0 - 100.0 mmHg  Bicarbonate 24.3 (*) 20.0 - 24.0 mEq/L    TCO2 26  0 - 100 mmol/L    O2 Saturation 96.0      Acid-base deficit 2.0  0.0 - 2.0 mmol/L    Patient temperature 38.3 C      Sample type ARTERIAL     GLUCOSE, CAPILLARY     Status: Abnormal   Collection Time   08/22/12 10:06 PM      Component Value Range Comment   Glucose-Capillary 169 (*) 70 - 99 mg/dL   GLUCOSE, CAPILLARY     Status: Abnormal   Collection Time   08/22/12 11:22 PM      Component Value Range Comment   Glucose-Capillary 132 (*) 70 - 99 mg/dL   CBC     Status: Abnormal   Collection Time   08/23/12  4:00 AM      Component Value Range Comment   WBC 12.2 (*) 4.0 - 10.5 K/uL    RBC 3.61 (*) 4.22 - 5.81 MIL/uL     Hemoglobin 11.5 (*) 13.0 - 17.0 g/dL    HCT 21.3 (*) 08.6 - 52.0 %    MCV 92.5  78.0 - 100.0 fL    MCH 31.9  26.0 - 34.0 pg    MCHC 34.4  30.0 - 36.0 g/dL    RDW 57.8  46.9 - 62.9 %    Platelets 100 (*) 150 - 400 K/uL CONSISTENT WITH PREVIOUS RESULT  BASIC METABOLIC PANEL     Status: Abnormal   Collection Time   08/23/12  4:00 AM      Component Value Range Comment   Sodium 138  135 - 145 mEq/L    Potassium 3.9  3.5 - 5.1 mEq/L    Chloride 105  96 - 112 mEq/L    CO2 25  19 - 32 mEq/L    Glucose, Bld 114 (*) 70 - 99 mg/dL    BUN 8  6 - 23 mg/dL    Creatinine, Ser 5.28  0.50 - 1.35 mg/dL    Calcium 7.9 (*) 8.4 - 10.5 mg/dL    GFR calc non Af Amer >90  >90 mL/min    GFR calc Af Amer >90  >90 mL/min   MAGNESIUM     Status: Normal   Collection Time   08/23/12  4:00 AM      Component Value Range Comment   Magnesium 2.3  1.5 - 2.5 mg/dL     Imaging: Dg Chest Portable 1 View In Am  08/23/2012  *RADIOLOGY REPORT*  Clinical Data: Chest pain and congestion.  PORTABLE CHEST - 1 VIEW  Comparison: 08/22/2012  Findings: Postoperative changes in the mediastinum and cervical spine are stable.  Interval removal of the endotracheal tube and enteric tube.  Swan-Ganz catheter, mediastinal drain, and left chest tube remain unchanged.  There appears to be atelectasis in the left lung base.  No residual or recurrent pneumothorax.  The right lung is clear.  IMPRESSION: Atelectasis in the left lung base.  Interval removal of endotracheal and enteric tubes.  Appliances are otherwise stable in position.   Original Report Authenticated By: Burman Nieves, M.D.    Dg Chest Portable 1 View  08/22/2012  *RADIOLOGY REPORT*  Clinical Data: Postop CABG.  PORTABLE CHEST - 1 VIEW  Comparison: 08/20/2012  Findings: 2.  Frontal views.  Endotracheal tube terminates 4.9 cm above carina.  Nasogastric tube extends beyond the  inferior aspect of the film.  Left-sided chest tube.  Right internal  jugular Theone Murdoch catheter  terminates at the pulmonary outflow tract or proximal most right pulmonary artery.  Interval median sternotomy. Normal heart size.  No pleural effusion or pneumothorax.  No congestive failure.  Clear lungs.  Mildly low lung volumes.  IMPRESSION: Interval median sternotomy, without acute disease.  Appropriate position of support apparatus, without pneumothorax.   Original Report Authenticated By: Jeronimo Greaves, M.D.     Assessment:  Active Problems:  Diabetes mellitus  Hypertension  Hepatomegaly  Coronary atherosclerosis of native coronary artery  S/P CABG x 4  PAD (peripheral artery disease)  Plan:  1. Doing well post-CABG.  Good BP control off pressors. CO >5.5. Maintaining sinus. Blood glucose control improved. Note pre-operative LE arterial dopplers with monophasic waveforms and severely reduced ABI's. This will need to be addressed in the future - he does report mild claudication symptoms.  Time Spent Directly with Patient:  15 minutes  Length of Stay:  LOS: 1 day   Chrystie Nose, MD, [facility] Attending Cardiologist [facility]  HILTY,Kenneth C 08/23/2012, 8:04 AM

## 2012-08-23 NOTE — Progress Notes (Signed)
Patient ID: [name], male   DOB: [birth year], 52 y.o.   MRN: [id number]                   [address]            [place] [address]          [phone]     1 Day Post-Op Procedure(s) (LRB): CORONARY ARTERY BYPASS GRAFTING (CABG) (N/A) INTRAOPERATIVE TRANSESOPHAGEAL ECHOCARDIOGRAM (N/A)  Total Length of Stay:  LOS: 1 day  BP 116/73  Pulse 87  Temp 98.8 F (37.1 C) (Oral)  Resp 16  Ht 5\' 11"  (1.803 m)  Wt 192 lb 10.9 oz (87.4 kg)  BMI 26.87 kg/m2  SpO2 93%  .Intake/Output      01/17 0701 - 01/18 0700 01/18 0701 - 01/19 0700   P.O.  600   I.V. (mL/kg) 4369.2 (50) 351.2 (4)   Blood 571    IV Piggyback 1650 50   Total Intake(mL/kg) 6590.2 (75.4) 1001.2 (11.5)   Urine (mL/kg/hr) 3450 (1.6) 2085 (2.2)   Blood 1400    Chest Tube 610 50   Total Output 5460 2135   Net +1130.2 -1133.8             . sodium chloride Stopped (08/23/12 1000)  . sodium chloride 10 mL/hr at 08/22/12 1900  . dexmedetomidine Stopped (08/22/12 2000)  . DOPamine Stopped (08/22/12 1315)  . lactated ringers 20 mL/hr at 08/23/12 0600  . nitroGLYCERIN Stopped (08/23/12 0630)  . phenylephrine (NEO-SYNEPHRINE) Adult infusion Stopped (08/22/12 2000)     Lab Results  Component Value Date   WBC 13.8* 08/23/2012   HGB 12.9* 08/23/2012   HCT 38.0* 08/23/2012   PLT 95* 08/23/2012   GLUCOSE 121* 08/23/2012   ALT 33 08/20/2012   AST 22 08/20/2012   NA 138 08/23/2012   K 3.8 08/23/2012   CL 100 08/23/2012   CREATININE 0.80 08/23/2012   BUN 5* 08/23/2012   CO2 25 08/23/2012   TSH 1.762 01/17/2012   INR 1.28 08/22/2012   HGBA1C 10.7* 08/20/2012   Walked around the unit, glucose with adequate control   Delight Ovens MD  Beeper [phone] Office [phone] 08/23/2012 6:03 PM

## 2012-08-24 ENCOUNTER — Inpatient Hospital Stay (HOSPITAL_COMMUNITY): Payer: Medicare Other

## 2012-08-24 LAB — CBC
HCT: 34.9 % — ABNORMAL LOW (ref 39.0–52.0)
Hemoglobin: 11.7 g/dL — ABNORMAL LOW (ref 13.0–17.0)
MCH: 31.1 pg (ref 26.0–34.0)
MCHC: 33.5 g/dL (ref 30.0–36.0)
MCV: 92.8 fL (ref 78.0–100.0)
Platelets: 94 10*3/uL — ABNORMAL LOW (ref 150–400)
RBC: 3.76 MIL/uL — ABNORMAL LOW (ref 4.22–5.81)
RDW: 12.2 % (ref 11.5–15.5)
WBC: 12.2 10*3/uL — ABNORMAL HIGH (ref 4.0–10.5)

## 2012-08-24 LAB — GLUCOSE, CAPILLARY
Glucose-Capillary: 100 mg/dL — ABNORMAL HIGH (ref 70–99)
Glucose-Capillary: 102 mg/dL — ABNORMAL HIGH (ref 70–99)
Glucose-Capillary: 107 mg/dL — ABNORMAL HIGH (ref 70–99)
Glucose-Capillary: 109 mg/dL — ABNORMAL HIGH (ref 70–99)
Glucose-Capillary: 110 mg/dL — ABNORMAL HIGH (ref 70–99)
Glucose-Capillary: 112 mg/dL — ABNORMAL HIGH (ref 70–99)
Glucose-Capillary: 117 mg/dL — ABNORMAL HIGH (ref 70–99)
Glucose-Capillary: 121 mg/dL — ABNORMAL HIGH (ref 70–99)
Glucose-Capillary: 122 mg/dL — ABNORMAL HIGH (ref 70–99)
Glucose-Capillary: 123 mg/dL — ABNORMAL HIGH (ref 70–99)
Glucose-Capillary: 127 mg/dL — ABNORMAL HIGH (ref 70–99)
Glucose-Capillary: 130 mg/dL — ABNORMAL HIGH (ref 70–99)
Glucose-Capillary: 130 mg/dL — ABNORMAL HIGH (ref 70–99)
Glucose-Capillary: 132 mg/dL — ABNORMAL HIGH (ref 70–99)
Glucose-Capillary: 136 mg/dL — ABNORMAL HIGH (ref 70–99)
Glucose-Capillary: 138 mg/dL — ABNORMAL HIGH (ref 70–99)
Glucose-Capillary: 152 mg/dL — ABNORMAL HIGH (ref 70–99)
Glucose-Capillary: 172 mg/dL — ABNORMAL HIGH (ref 70–99)
Glucose-Capillary: 188 mg/dL — ABNORMAL HIGH (ref 70–99)
Glucose-Capillary: 210 mg/dL — ABNORMAL HIGH (ref 70–99)
Glucose-Capillary: 212 mg/dL — ABNORMAL HIGH (ref 70–99)
Glucose-Capillary: 223 mg/dL — ABNORMAL HIGH (ref 70–99)
Glucose-Capillary: 241 mg/dL — ABNORMAL HIGH (ref 70–99)
Glucose-Capillary: 244 mg/dL — ABNORMAL HIGH (ref 70–99)
Glucose-Capillary: 84 mg/dL (ref 70–99)
Glucose-Capillary: 90 mg/dL (ref 70–99)
Glucose-Capillary: 93 mg/dL (ref 70–99)
Glucose-Capillary: 94 mg/dL (ref 70–99)
Glucose-Capillary: 99 mg/dL (ref 70–99)

## 2012-08-24 LAB — BASIC METABOLIC PANEL
BUN: 6 mg/dL (ref 6–23)
CO2: 30 mEq/L (ref 19–32)
Calcium: 8.7 mg/dL (ref 8.4–10.5)
Chloride: 97 mEq/L (ref 96–112)
Creatinine, Ser: 0.65 mg/dL (ref 0.50–1.35)
GFR calc Af Amer: >90 mL/min (ref >90)
GFR calc non Af Amer: >90 mL/min (ref >90)
Glucose, Bld: 97 mg/dL (ref 70–99)
Potassium: 3.5 mEq/L (ref 3.5–5.1)
Sodium: 134 mEq/L — ABNORMAL LOW (ref 135–145)

## 2012-08-24 MED ORDER — TRAMADOL HCL 50 MG PO TABS: 50.0000 mg | ORAL_TABLET | ORAL | Status: DC | PRN

## 2012-08-24 MED ORDER — SODIUM CHLORIDE 0.9 % IV SOLN
INTRAVENOUS | Status: DC
  Administered 2012-08-24: 13:00:00 via INTRAVENOUS

## 2012-08-24 MED ORDER — OXYCODONE HCL 5 MG PO TABS
5.0000 mg | ORAL_TABLET | ORAL | Status: DC | PRN
  Administered 2012-08-24: 5 mg via ORAL
  Administered 2012-08-24: 10 mg via ORAL
  Administered 2012-08-25: 5 mg via ORAL
  Filled 2012-08-24 (×2): qty 1
  Filled 2012-08-24: qty 2

## 2012-08-24 MED ORDER — GUAIFENESIN ER 600 MG PO TB12
600.0000 mg | ORAL_TABLET | Freq: Two times a day (BID) | ORAL | Status: DC | PRN
  Filled 2012-08-24: qty 1

## 2012-08-24 MED ORDER — PANTOPRAZOLE SODIUM 40 MG PO TBEC
40.0000 mg | DELAYED_RELEASE_TABLET | Freq: Every day | ORAL | Status: DC
  Administered 2012-08-24 – 2012-08-26 (×2): 40 mg via ORAL
  Filled 2012-08-24 (×2): qty 1

## 2012-08-24 MED ORDER — METOPROLOL TARTRATE 12.5 MG HALF TABLET
12.5000 mg | ORAL_TABLET | Freq: Two times a day (BID) | ORAL | Status: DC
  Administered 2012-08-24 (×2): 12.5 mg via ORAL
  Filled 2012-08-24 (×4): qty 1

## 2012-08-24 MED ORDER — ONDANSETRON HCL 4 MG/2ML IJ SOLN: 4.0000 mg | Freq: Four times a day (QID) | INTRAMUSCULAR | Status: DC | PRN

## 2012-08-24 MED ORDER — ASPIRIN EC 325 MG PO TBEC
325.0000 mg | DELAYED_RELEASE_TABLET | Freq: Every day | ORAL | Status: DC
  Administered 2012-08-24 – 2012-08-26 (×3): 325 mg via ORAL
  Filled 2012-08-24 (×3): qty 1

## 2012-08-24 MED ORDER — DEXTROSE 50 % IV SOLN: 25.0000 mL | INTRAVENOUS | Status: DC | PRN

## 2012-08-24 MED ORDER — ENOXAPARIN SODIUM 40 MG/0.4ML ~~LOC~~ SOLN
40.0000 mg | SUBCUTANEOUS | Status: DC
  Administered 2012-08-24 – 2012-08-25 (×2): 40 mg via SUBCUTANEOUS
  Filled 2012-08-24 (×3): qty 0.4

## 2012-08-24 MED ORDER — BISACODYL 5 MG PO TBEC
10.0000 mg | DELAYED_RELEASE_TABLET | Freq: Every day | ORAL | Status: DC | PRN
  Administered 2012-08-24: 10 mg via ORAL
  Filled 2012-08-24: qty 2

## 2012-08-24 MED ORDER — POTASSIUM CHLORIDE CRYS ER 20 MEQ PO TBCR
20.0000 meq | EXTENDED_RELEASE_TABLET | Freq: Two times a day (BID) | ORAL | Status: DC
  Administered 2012-08-24 – 2012-08-25 (×4): 20 meq via ORAL
  Filled 2012-08-24 (×5): qty 1

## 2012-08-24 MED ORDER — INSULIN ASPART 100 UNIT/ML ~~LOC~~ SOLN: 0.0000 [IU] | Freq: Three times a day (TID) | SUBCUTANEOUS | Status: DC

## 2012-08-24 MED ORDER — MOVING RIGHT ALONG BOOK
Freq: Once | Status: AC
  Administered 2012-08-24: 10:00:00
  Filled 2012-08-24: qty 1

## 2012-08-24 MED ORDER — SODIUM CHLORIDE 0.9 % IJ SOLN: 3.0000 mL | INTRAMUSCULAR | Status: DC | PRN

## 2012-08-24 MED ORDER — ALPRAZOLAM 0.25 MG PO TABS: 0.2500 mg | ORAL_TABLET | Freq: Four times a day (QID) | ORAL | Status: DC | PRN

## 2012-08-24 MED ORDER — INSULIN REGULAR BOLUS VIA INFUSION
0.0000 [IU] | Freq: Three times a day (TID) | INTRAVENOUS | Status: DC
  Filled 2012-08-24: qty 10

## 2012-08-24 MED ORDER — SODIUM CHLORIDE 0.9 % IJ SOLN
3.0000 mL | Freq: Two times a day (BID) | INTRAMUSCULAR | Status: DC
  Administered 2012-08-24 – 2012-08-25 (×4): 3 mL via INTRAVENOUS

## 2012-08-24 MED ORDER — DOCUSATE SODIUM 100 MG PO CAPS
200.0000 mg | ORAL_CAPSULE | Freq: Every day | ORAL | Status: DC
  Administered 2012-08-24 – 2012-08-26 (×3): 200 mg via ORAL
  Filled 2012-08-24 (×3): qty 2

## 2012-08-24 MED ORDER — POTASSIUM CHLORIDE 10 MEQ/50ML IV SOLN
10.0000 meq | INTRAVENOUS | Status: AC
  Administered 2012-08-24 (×3): 10 meq via INTRAVENOUS

## 2012-08-24 MED ORDER — ONDANSETRON HCL 4 MG PO TABS: 4.0000 mg | ORAL_TABLET | Freq: Four times a day (QID) | ORAL | Status: DC | PRN

## 2012-08-24 MED ORDER — FUROSEMIDE 40 MG PO TABS
40.0000 mg | ORAL_TABLET | Freq: Every day | ORAL | Status: AC
  Administered 2012-08-24 – 2012-08-26 (×3): 40 mg via ORAL
  Filled 2012-08-24 (×3): qty 1

## 2012-08-24 MED ORDER — SODIUM CHLORIDE 0.9 % IV SOLN: 250.0000 mL | INTRAVENOUS | Status: DC | PRN

## 2012-08-24 MED ORDER — SODIUM CHLORIDE 0.9 % IV SOLN
INTRAVENOUS | Status: DC
  Administered 2012-08-24: 5.4 [IU]/h via INTRAVENOUS
  Filled 2012-08-24: qty 1

## 2012-08-24 MED ORDER — BISACODYL 10 MG RE SUPP: 10.0000 mg | Freq: Every day | RECTAL | Status: DC | PRN

## 2012-08-24 NOTE — Progress Notes (Signed)
Patient transferred from 2308 to 2017 and attached to tele monitor with no complications.  Patients belongings travelled with him, including his eye glasses.

## 2012-08-24 NOTE — Progress Notes (Signed)
Pt ambulated from 2300 to 2000 for his second walk today. Pt tolerated well. Pt is stable, and resting in bed.

## 2012-08-24 NOTE — Progress Notes (Signed)
[facility]  DAILY PROGRESS NOTE   Subjective:  No events overnight. Ambulating well on the unit, but with claudication. Maintaining sinus. No chest pain.  Objective:  Temp:  [97.9 F (36.6 C)-99.6 F (37.6 C)] 98 F (36.7 C) (01/19 0810) Pulse Rate:  [72-113] 83  (01/19 0700) Resp:  [11-24] 12  (01/19 0700) BP: (113-156)/(61-84) 120/72 mmHg (01/19 0700) SpO2:  [92 %-97 %] 94 % (01/19 0919) FiO2 (%):  [28 %-40 %] 28 % (01/19 0919) Weight:  [83.8 kg (184 lb 11.9 oz)] 83.8 kg (184 lb 11.9 oz) (01/19 0600) Weight change: -3.6 kg (-7 lb 15 oz)  Intake/Output from previous day: 01/18 0701 - 01/19 0700 In: 2251.2 [P.O.:1480; I.V.:571.2; IV Piggyback:200] Out: 1610 [RUEAV:4098; Chest Tube:350]  Intake/Output from this shift:    Medications: Current Facility-Administered Medications  Medication Dose Route Frequency Provider Last Rate Last Dose  . 0.9 %  sodium chloride infusion  250 mL Intravenous PRN Delight Ovens, MD      . albuterol-ipratropium (COMBIVENT) inhaler 2 puff  2 puff Inhalation Q4H Ardelle Balls, PA   2 puff at 08/24/12 0809  . ALPRAZolam Prudy Feeler) tablet 0.25 mg  0.25 mg Oral Q6H PRN Delight Ovens, MD      . aspirin EC tablet 325 mg  325 mg Oral Daily Delight Ovens, MD      . bisacodyl (DULCOLAX) EC tablet 10 mg  10 mg Oral Daily PRN Delight Ovens, MD       Or  . bisacodyl (DULCOLAX) suppository 10 mg  10 mg Rectal Daily PRN Delight Ovens, MD      . docusate sodium (COLACE) capsule 200 mg  200 mg Oral Daily Delight Ovens, MD      . DULoxetine (CYMBALTA) DR capsule 60 mg  60 mg Oral Daily Ardelle Balls, PA   60 mg at 08/23/12 1026  . enoxaparin (LOVENOX) injection 40 mg  40 mg Subcutaneous Q24H Delight Ovens, MD      . folic acid (FOLVITE) tablet 1 mg  1 mg Oral Daily Ardelle Balls, PA   1 mg at 08/23/12 1026  . furosemide (LASIX) tablet 40 mg  40 mg Oral Daily Delight Ovens, MD      .  guaiFENesin [facility]) 12 hr tablet 600 mg  600 mg Oral Q12H PRN Delight Ovens, MD      . insulin aspart (novoLOG) injection 0-24 Units  0-24 Units Subcutaneous TID AC & HS Delight Ovens, MD      . insulin detemir (LEVEMIR) injection 25 Units  25 Units Subcutaneous Q1200 Delight Ovens, MD   25 Units at 08/23/12 1216  . metoprolol tartrate (LOPRESSOR) tablet 12.5 mg  12.5 mg Oral BID Delight Ovens, MD      . moving right along book   Does not apply Once Delight Ovens, MD      . ondansetron [facility]) tablet 4 mg  4 mg Oral Q6H PRN Delight Ovens, MD       Or  . ondansetron [facility]) injection 4 mg  4 mg Intravenous Q6H PRN Delight Ovens, MD      . oxyCODONE (Oxy IR/ROXICODONE) immediate release tablet 5-10 mg  5-10 mg Oral Q3H PRN Delight Ovens, MD      . pantoprazole (PROTONIX) EC tablet 40 mg  40 mg Oral QAC breakfast Delight Ovens, MD      .  potassium chloride SA (K-DUR,KLOR-CON) CR tablet 20 mEq  20 mEq Oral BID Delight Ovens, MD      . simvastatin (ZOCOR) tablet 40 mg  40 mg Oral QPC breakfast Ardelle Balls, PA   40 mg at 08/23/12 1026  . sodium chloride 0.9 % injection 3 mL  3 mL Intravenous Q12H Delight Ovens, MD      . sodium chloride 0.9 % injection 3 mL  3 mL Intravenous PRN Delight Ovens, MD      . Tamsulosin HCl [facility]) capsule 0.4 mg  0.4 mg Oral Daily Ardelle Balls, PA   0.4 mg at 08/23/12 1026  . thiamine (VITAMIN B-1) tablet 100 mg  100 mg Oral Daily Ardelle Balls, PA   100 mg at 08/23/12 1026  . traMADol (ULTRAM) tablet 50-100 mg  50-100 mg Oral Q4H PRN Delight Ovens, MD        Physical Exam: General appearance: alert and no distress Neck: no adenopathy, no carotid bruit, no JVD, supple, symmetrical, trachea midline and thyroid not enlarged, symmetric, no tenderness/mass/nodules Lungs: clear to auscultation bilaterally Heart: regular rate and rhythm, S1, S2 normal, no murmur, click, rub or gallop Abdomen:  soft, non-tender; bowel sounds normal; no masses,  no organomegaly Extremities: extremities normal, atraumatic, no cyanosis or edema Pulses: 2+ and symmetric Skin: Skin color, texture, turgor normal. No rashes or lesions Neurologic: Grossly normal  Lab Results: Results for orders placed during the hospital encounter of 08/22/12 (from the past 48 hour(s))  POCT I-STAT 4, (NA,K, GLUC, HGB,HCT)     Status: Abnormal   Collection Time   08/22/12  9:38 AM      Component Value Range Comment   Sodium 140  135 - 145 mEq/L    Potassium 3.8  3.5 - 5.1 mEq/L    Glucose, Bld 118 (*) 70 - 99 mg/dL    HCT 13.0  86.5 - 78.4 %    Hemoglobin 13.9  13.0 - 17.0 g/dL   POCT I-STAT 3, BLOOD GAS (G3+)     Status: Abnormal   Collection Time   08/22/12 10:01 AM      Component Value Range Comment   pH, Arterial 7.317 (*) 7.350 - 7.450    pCO2 arterial 54.4 (*) 35.0 - 45.0 mmHg    pO2, Arterial 384.0 (*) 80.0 - 100.0 mmHg    Bicarbonate 27.8 (*) 20.0 - 24.0 mEq/L    TCO2 29  0 - 100 mmol/L    O2 Saturation 100.0      Acid-Base Excess 1.0  0.0 - 2.0 mmol/L    Sample type ARTERIAL     POCT I-STAT 4, (NA,K, GLUC, HGB,HCT)     Status: Abnormal   Collection Time   08/22/12 10:04 AM      Component Value Range Comment   Sodium 138  135 - 145 mEq/L    Potassium 3.4 (*) 3.5 - 5.1 mEq/L    Glucose, Bld 85  70 - 99 mg/dL    HCT 69.6 (*) 29.5 - 52.0 %    Hemoglobin 11.2 (*) 13.0 - 17.0 g/dL   HEMOGLOBIN AND HEMATOCRIT, BLOOD     Status: Abnormal   Collection Time   08/22/12 11:03 AM      Component Value Range Comment   Hemoglobin 12.3 (*) 13.0 - 17.0 g/dL DELTA CHECK NOTED   HCT 34.7 (*) 39.0 - 52.0 %   PLATELET COUNT     Status: Abnormal   Collection  Time   08/22/12 11:03 AM      Component Value Range Comment   Platelets 143 (*) 150 - 400 K/uL   POCT I-STAT 4, (NA,K, GLUC, HGB,HCT)     Status: Abnormal   Collection Time   08/22/12 11:05 AM      Component Value Range Comment   Sodium 139  135 - 145 mEq/L     Potassium 4.7  3.5 - 5.1 mEq/L    Glucose, Bld 103 (*) 70 - 99 mg/dL    HCT 16.1 (*) 09.6 - 52.0 %    Hemoglobin 11.9 (*) 13.0 - 17.0 g/dL   POCT I-STAT 4, (NA,K, GLUC, HGB,HCT)     Status: Abnormal   Collection Time   08/22/12 12:04 PM      Component Value Range Comment   Sodium 142  135 - 145 mEq/L    Potassium 3.4 (*) 3.5 - 5.1 mEq/L    Glucose, Bld 130 (*) 70 - 99 mg/dL    HCT 04.5 (*) 40.9 - 52.0 %    Hemoglobin 11.9 (*) 13.0 - 17.0 g/dL   CBC     Status: Abnormal   Collection Time   08/22/12  1:30 PM      Component Value Range Comment   WBC 15.5 (*) 4.0 - 10.5 K/uL    RBC 4.13 (*) 4.22 - 5.81 MIL/uL    Hemoglobin 13.1  13.0 - 17.0 g/dL    HCT 81.1 (*) 91.4 - 52.0 %    MCV 90.1  78.0 - 100.0 fL    MCH 31.7  26.0 - 34.0 pg    MCHC 35.2  30.0 - 36.0 g/dL    RDW 78.2  95.6 - 21.3 %    Platelets 119 (*) 150 - 400 K/uL PLATELET COUNT CONFIRMED BY SMEAR  PROTIME-INR     Status: Abnormal   Collection Time   08/22/12  1:30 PM      Component Value Range Comment   Prothrombin Time 15.7 (*) 11.6 - 15.2 seconds    INR 1.28  0.00 - 1.49   APTT     Status: Normal   Collection Time   08/22/12  1:30 PM      Component Value Range Comment   aPTT 32  24 - 37 seconds   POCT I-STAT 4, (NA,K, GLUC, HGB,HCT)     Status: Abnormal   Collection Time   08/22/12  1:30 PM      Component Value Range Comment   Sodium 143  135 - 145 mEq/L    Potassium 3.6  3.5 - 5.1 mEq/L    Glucose, Bld 143 (*) 70 - 99 mg/dL    HCT 08.6  57.8 - 46.9 %    Hemoglobin 13.3  13.0 - 17.0 g/dL   POCT I-STAT 3, BLOOD GAS (G3+)     Status: Abnormal   Collection Time   08/22/12  1:39 PM      Component Value Range Comment   pH, Arterial 7.315 (*) 7.350 - 7.450    pCO2 arterial 50.3 (*) 35.0 - 45.0 mmHg    pO2, Arterial 90.0  80.0 - 100.0 mmHg    Bicarbonate 25.9 (*) 20.0 - 24.0 mEq/L    TCO2 27  0 - 100 mmol/L    O2 Saturation 96.0      Acid-base deficit 1.0  0.0 - 2.0 mmol/L    Patient temperature 36.0 C      Sample  type ARTERIAL  GLUCOSE, CAPILLARY     Status: Abnormal   Collection Time   08/22/12  2:45 PM      Component Value Range Comment   Glucose-Capillary 120 (*) 70 - 99 mg/dL   GLUCOSE, CAPILLARY     Status: Abnormal   Collection Time   08/22/12  3:43 PM      Component Value Range Comment   Glucose-Capillary 112 (*) 70 - 99 mg/dL   GLUCOSE, CAPILLARY     Status: Abnormal   Collection Time   08/22/12  4:45 PM      Component Value Range Comment   Glucose-Capillary 102 (*) 70 - 99 mg/dL   GLUCOSE, CAPILLARY     Status: Abnormal   Collection Time   08/22/12  5:57 PM      Component Value Range Comment   Glucose-Capillary 116 (*) 70 - 99 mg/dL   GLUCOSE, CAPILLARY     Status: Abnormal   Collection Time   08/22/12  6:55 PM      Component Value Range Comment   Glucose-Capillary 107 (*) 70 - 99 mg/dL   POCT I-STAT, CHEM 8     Status: Abnormal   Collection Time   08/22/12  7:01 PM      Component Value Range Comment   Sodium 142  135 - 145 mEq/L    Potassium 4.1  3.5 - 5.1 mEq/L    Chloride 106  96 - 112 mEq/L    BUN 9  6 - 23 mg/dL    Creatinine, Ser 4.09  0.50 - 1.35 mg/dL    Glucose, Bld 811 (*) 70 - 99 mg/dL    Calcium, Ion 9.14  7.82 - 1.23 mmol/L    TCO2 25  0 - 100 mmol/L    Hemoglobin 12.9 (*) 13.0 - 17.0 g/dL    HCT 95.6 (*) 21.3 - 52.0 %   CBC     Status: Abnormal   Collection Time   08/22/12  7:15 PM      Component Value Range Comment   WBC 15.8 (*) 4.0 - 10.5 K/uL    RBC 4.20 (*) 4.22 - 5.81 MIL/uL    Hemoglobin 13.4  13.0 - 17.0 g/dL    HCT 08.6 (*) 57.8 - 52.0 %    MCV 91.2  78.0 - 100.0 fL    MCH 31.9  26.0 - 34.0 pg    MCHC 35.0  30.0 - 36.0 g/dL    RDW 46.9  62.9 - 52.8 %    Platelets 124 (*) 150 - 400 K/uL   CREATININE, SERUM     Status: Normal   Collection Time   08/22/12  7:15 PM      Component Value Range Comment   Creatinine, Ser 0.61  0.50 - 1.35 mg/dL    GFR calc non Af Amer >90  >90 mL/min    GFR calc Af Amer >90  >90 mL/min   MAGNESIUM     Status:  Abnormal   Collection Time   08/22/12  7:15 PM      Component Value Range Comment   Magnesium 2.9 (*) 1.5 - 2.5 mg/dL   POCT I-STAT 3, BLOOD GAS (G3+)     Status: Abnormal   Collection Time   08/22/12  7:34 PM      Component Value Range Comment   pH, Arterial 7.199 (*) 7.350 - 7.450    pCO2 arterial 68.9 (*) 35.0 - 45.0 mmHg    pO2, Arterial 94.0  80.0 -  100.0 mmHg    Bicarbonate 26.7 (*) 20.0 - 24.0 mEq/L    TCO2 29  0 - 100 mmol/L    O2 Saturation 95.0      Acid-base deficit 3.0 (*) 0.0 - 2.0 mmol/L    Patient temperature 37.4 C      Sample type ARTERIAL      Comment MD NOTIFIED, REPEAT TEST     GLUCOSE, CAPILLARY     Status: Abnormal   Collection Time   08/22/12  7:50 PM      Component Value Range Comment   Glucose-Capillary 126 (*) 70 - 99 mg/dL   GLUCOSE, CAPILLARY     Status: Abnormal   Collection Time   08/22/12  8:58 PM      Component Value Range Comment   Glucose-Capillary 123 (*) 70 - 99 mg/dL   POCT I-STAT 3, BLOOD GAS (G3+)     Status: Abnormal   Collection Time   08/22/12 10:04 PM      Component Value Range Comment   pH, Arterial 7.319 (*) 7.350 - 7.450    pCO2 arterial 47.9 (*) 35.0 - 45.0 mmHg    pO2, Arterial 96.0  80.0 - 100.0 mmHg    Bicarbonate 24.3 (*) 20.0 - 24.0 mEq/L    TCO2 26  0 - 100 mmol/L    O2 Saturation 96.0      Acid-base deficit 2.0  0.0 - 2.0 mmol/L    Patient temperature 38.3 C      Sample type ARTERIAL     GLUCOSE, CAPILLARY     Status: Abnormal   Collection Time   08/22/12 10:06 PM      Component Value Range Comment   Glucose-Capillary 169 (*) 70 - 99 mg/dL   GLUCOSE, CAPILLARY     Status: Abnormal   Collection Time   08/22/12 11:22 PM      Component Value Range Comment   Glucose-Capillary 132 (*) 70 - 99 mg/dL   GLUCOSE, CAPILLARY     Status: Abnormal   Collection Time   08/23/12 12:18 AM      Component Value Range Comment   Glucose-Capillary 109 (*) 70 - 99 mg/dL   GLUCOSE, CAPILLARY     Status: Abnormal   Collection Time    08/23/12  1:03 AM      Component Value Range Comment   Glucose-Capillary 132 (*) 70 - 99 mg/dL   GLUCOSE, CAPILLARY     Status: Abnormal   Collection Time   08/23/12  2:03 AM      Component Value Range Comment   Glucose-Capillary 127 (*) 70 - 99 mg/dL   GLUCOSE, CAPILLARY     Status: Abnormal   Collection Time   08/23/12  2:57 AM      Component Value Range Comment   Glucose-Capillary 122 (*) 70 - 99 mg/dL   GLUCOSE, CAPILLARY     Status: Abnormal   Collection Time   08/23/12  3:56 AM      Component Value Range Comment   Glucose-Capillary 112 (*) 70 - 99 mg/dL   CBC     Status: Abnormal   Collection Time   08/23/12  4:00 AM      Component Value Range Comment   WBC 12.2 (*) 4.0 - 10.5 K/uL    RBC 3.61 (*) 4.22 - 5.81 MIL/uL    Hemoglobin 11.5 (*) 13.0 - 17.0 g/dL    HCT 47.8 (*) 29.5 - 52.0 %    MCV 92.5  78.0 - 100.0 fL    MCH 31.9  26.0 - 34.0 pg    MCHC 34.4  30.0 - 36.0 g/dL    RDW 14.7  82.9 - 56.2 %    Platelets 100 (*) 150 - 400 K/uL CONSISTENT WITH PREVIOUS RESULT  BASIC METABOLIC PANEL     Status: Abnormal   Collection Time   08/23/12  4:00 AM      Component Value Range Comment   Sodium 138  135 - 145 mEq/L    Potassium 3.9  3.5 - 5.1 mEq/L    Chloride 105  96 - 112 mEq/L    CO2 25  19 - 32 mEq/L    Glucose, Bld 114 (*) 70 - 99 mg/dL    BUN 8  6 - 23 mg/dL    Creatinine, Ser 1.30  0.50 - 1.35 mg/dL    Calcium 7.9 (*) 8.4 - 10.5 mg/dL    GFR calc non Af Amer >90  >90 mL/min    GFR calc Af Amer >90  >90 mL/min   MAGNESIUM     Status: Normal   Collection Time   08/23/12  4:00 AM      Component Value Range Comment   Magnesium 2.3  1.5 - 2.5 mg/dL   GLUCOSE, CAPILLARY     Status: Abnormal   Collection Time   08/23/12  4:54 AM      Component Value Range Comment   Glucose-Capillary 107 (*) 70 - 99 mg/dL   GLUCOSE, CAPILLARY     Status: Abnormal   Collection Time   08/23/12  5:55 AM      Component Value Range Comment   Glucose-Capillary 130 (*) 70 - 99 mg/dL     GLUCOSE, CAPILLARY     Status: Abnormal   Collection Time   08/23/12  6:52 AM      Component Value Range Comment   Glucose-Capillary 121 (*) 70 - 99 mg/dL   GLUCOSE, CAPILLARY     Status: Abnormal   Collection Time   08/23/12  7:45 AM      Component Value Range Comment   Glucose-Capillary 110 (*) 70 - 99 mg/dL   GLUCOSE, CAPILLARY     Status: Abnormal   Collection Time   08/23/12  8:42 AM      Component Value Range Comment   Glucose-Capillary 100 (*) 70 - 99 mg/dL   GLUCOSE, CAPILLARY     Status: Normal   Collection Time   08/23/12  9:55 AM      Component Value Range Comment   Glucose-Capillary 93  70 - 99 mg/dL   GLUCOSE, CAPILLARY     Status: Abnormal   Collection Time   08/23/12 10:59 AM      Component Value Range Comment   Glucose-Capillary 223 (*) 70 - 99 mg/dL   GLUCOSE, CAPILLARY     Status: Abnormal   Collection Time   08/23/12 11:52 AM      Component Value Range Comment   Glucose-Capillary 188 (*) 70 - 99 mg/dL   GLUCOSE, CAPILLARY     Status: Abnormal   Collection Time   08/23/12  1:00 PM      Component Value Range Comment   Glucose-Capillary 152 (*) 70 - 99 mg/dL   GLUCOSE, CAPILLARY     Status: Abnormal   Collection Time   08/23/12  1:54 PM      Component Value Range Comment   Glucose-Capillary 172 (*) 70 - 99 mg/dL  MAGNESIUM     Status: Normal   Collection Time   08/23/12  5:00 PM      Component Value Range Comment   Magnesium 2.1  1.5 - 2.5 mg/dL   CBC     Status: Abnormal   Collection Time   08/23/12  5:00 PM      Component Value Range Comment   WBC 13.8 (*) 4.0 - 10.5 K/uL    RBC 3.99 (*) 4.22 - 5.81 MIL/uL    Hemoglobin 12.4 (*) 13.0 - 17.0 g/dL    HCT 16.1 (*) 09.6 - 52.0 %    MCV 93.2  78.0 - 100.0 fL    MCH 31.1  26.0 - 34.0 pg    MCHC 33.3  30.0 - 36.0 g/dL    RDW 04.5  40.9 - 81.1 %    Platelets 95 (*) 150 - 400 K/uL CONSISTENT WITH PREVIOUS RESULT  CREATININE, SERUM     Status: Normal   Collection Time   08/23/12  5:00 PM      Component  Value Range Comment   Creatinine, Ser 0.68  0.50 - 1.35 mg/dL    GFR calc non Af Amer >90  >90 mL/min    GFR calc Af Amer >90  >90 mL/min   GLUCOSE, CAPILLARY     Status: Abnormal   Collection Time   08/23/12  5:18 PM      Component Value Range Comment   Glucose-Capillary 140 (*) 70 - 99 mg/dL    Comment 1 Documented in Chart      Comment 2 Notify RN     POCT I-STAT, CHEM 8     Status: Abnormal   Collection Time   08/23/12  5:31 PM      Component Value Range Comment   Sodium 138  135 - 145 mEq/L    Potassium 3.8  3.5 - 5.1 mEq/L    Chloride 100  96 - 112 mEq/L    BUN 5 (*) 6 - 23 mg/dL    Creatinine, Ser 9.14  0.50 - 1.35 mg/dL    Glucose, Bld 782 (*) 70 - 99 mg/dL    Calcium, Ion 9.56  2.13 - 1.23 mmol/L    TCO2 28  0 - 100 mmol/L    Hemoglobin 12.9 (*) 13.0 - 17.0 g/dL    HCT 08.6 (*) 57.8 - 52.0 %   GLUCOSE, CAPILLARY     Status: Abnormal   Collection Time   08/23/12  7:57 PM      Component Value Range Comment   Glucose-Capillary 168 (*) 70 - 99 mg/dL   GLUCOSE, CAPILLARY     Status: Normal   Collection Time   08/24/12 12:11 AM      Component Value Range Comment   Glucose-Capillary 94  70 - 99 mg/dL   GLUCOSE, CAPILLARY     Status: Normal   Collection Time   08/24/12  3:58 AM      Component Value Range Comment   Glucose-Capillary 90  70 - 99 mg/dL   BASIC METABOLIC PANEL     Status: Abnormal   Collection Time   08/24/12  4:00 AM      Component Value Range Comment   Sodium 134 (*) 135 - 145 mEq/L    Potassium 3.5  3.5 - 5.1 mEq/L    Chloride 97  96 - 112 mEq/L    CO2 30  19 - 32 mEq/L    Glucose, Bld 97  70 - 99  mg/dL    BUN 6  6 - 23 mg/dL    Creatinine, Ser 1.30  0.50 - 1.35 mg/dL    Calcium 8.7  8.4 - 86.5 mg/dL    GFR calc non Af Amer >90  >90 mL/min    GFR calc Af Amer >90  >90 mL/min   CBC     Status: Abnormal   Collection Time   08/24/12  4:00 AM      Component Value Range Comment   WBC 12.2 (*) 4.0 - 10.5 K/uL    RBC 3.76 (*) 4.22 - 5.81 MIL/uL     Hemoglobin 11.7 (*) 13.0 - 17.0 g/dL    HCT 78.4 (*) 69.6 - 52.0 %    MCV 92.8  78.0 - 100.0 fL    MCH 31.1  26.0 - 34.0 pg    MCHC 33.5  30.0 - 36.0 g/dL    RDW 29.5  28.4 - 13.2 %    Platelets 94 (*) 150 - 400 K/uL CONSISTENT WITH PREVIOUS RESULT  GLUCOSE, CAPILLARY     Status: Abnormal   Collection Time   08/24/12  8:08 AM      Component Value Range Comment   Glucose-Capillary 117 (*) 70 - 99 mg/dL    Comment 1 Documented in Chart      Comment 2 Notify RN       Imaging: Dg Chest Portable 1 View In Am  08/24/2012  *RADIOLOGY REPORT*  Clinical Data: Postop cardiac surgery  PORTABLE CHEST - 1 VIEW  Comparison: Prior chest x-ray 08/23/2012  Findings: Interval removal of Swan-Ganz catheter.  Right IJ vascular sheath remains in unchanged position.  Stable left thoracostomy tube.  Epicardial pacing leads remain in place. Stable mild cardiomegaly.  Status post median sternotomy with evidence of multivessel CABG.  Left basilar opacity persists and may reflect atelectasis and potentially small residual pleural fluid. No pneumothorax.  The remainder of the lungs are clear.  No acute osseous abnormality.  IMPRESSION:  1.  Interval removal of Swan-Ganz catheter.  Other support apparatus in stable and satisfactory position. 2.  Persistent left basilar opacity which may reflect atelectasis or small residual pleural fluid. 3.  The lungs are otherwise clear.   Original Report Authenticated By: Malachy Moan, M.D.    Dg Chest Portable 1 View In Am  08/23/2012  *RADIOLOGY REPORT*  Clinical Data: Chest pain and congestion.  PORTABLE CHEST - 1 VIEW  Comparison: 08/22/2012  Findings: Postoperative changes in the mediastinum and cervical spine are stable.  Interval removal of the endotracheal tube and enteric tube.  Swan-Ganz catheter, mediastinal drain, and left chest tube remain unchanged.  There appears to be atelectasis in the left lung base.  No residual or recurrent pneumothorax.  The right lung is clear.   IMPRESSION: Atelectasis in the left lung base.  Interval removal of endotracheal and enteric tubes.  Appliances are otherwise stable in position.   Original Report Authenticated By: Burman Nieves, M.D.    Dg Chest Portable 1 View  08/22/2012  *RADIOLOGY REPORT*  Clinical Data: Postop CABG.  PORTABLE CHEST - 1 VIEW  Comparison: 08/20/2012  Findings: 2.  Frontal views.  Endotracheal tube terminates 4.9 cm above carina.  Nasogastric tube extends beyond the  inferior aspect of the film.  Left-sided chest tube.  Right internal jugular Swan- Ganz catheter terminates at the pulmonary outflow tract or proximal most right pulmonary artery.  Interval median sternotomy. Normal heart size.  No pleural effusion or pneumothorax.  No  congestive failure.  Clear lungs.  Mildly low lung volumes.  IMPRESSION: Interval median sternotomy, without acute disease.  Appropriate position of support apparatus, without pneumothorax.   Original Report Authenticated By: Jeronimo Greaves, M.D.     Assessment:  1. Active Problems: 2.  Diabetes mellitus 3.  Hypertension 4.  Hepatomegaly 5.  Coronary atherosclerosis of native coronary artery 6.  S/P CABG x 4 7.  PAD (peripheral artery disease) 8.   Plan:  1. Doing very well. Plan to move off unit today. On lopressor 12.5 mg BID. BP stable. Zocor for dyslipidemia. Levemir insulin.  Fasting BG much improved around 100. Outpatient LE angiogram at some point in the future for claudication once he stops smoking.  Time Spent Directly with Patient:  15 minutes  Length of Stay:  LOS: 2 days   Chrystie Nose, MD, [facility] Attending Cardiologist [facility]  HILTY,Kenneth C 08/24/2012, 9:23 AM

## 2012-08-24 NOTE — Progress Notes (Signed)
Patient ID: [name], male   DOB: Nov 21, 1960, 52 y.o.   MRN: [id number] TCTS DAILY PROGRESS NOTE                   [address]            [place] [address]          [phone]      2 Days Post-Op Procedure(s) (LRB): CORONARY ARTERY BYPASS GRAFTING (CABG) (N/A) INTRAOPERATIVE TRANSESOPHAGEAL ECHOCARDIOGRAM (N/A)  Total Length of Stay:  LOS: 2 days   Subjective: Feels well, no resp difficulity  Objective: Vital signs in last 24 hours: Temp:  [97.9 F (36.6 C)-99.6 F (37.6 C)] 98 F (36.7 C) (01/19 0810) Pulse Rate:  [72-113] 83  (01/19 0700) Cardiac Rhythm:  [-] Normal sinus rhythm (01/19 0750) Resp:  [11-24] 12  (01/19 0700) BP: (113-156)/(61-84) 120/72 mmHg (01/19 0700) SpO2:  [92 %-97 %] 95 % (01/19 0811) FiO2 (%):  [40 %] 40 % (01/19 0811) Weight:  [184 lb 11.9 oz (83.8 kg)] 184 lb 11.9 oz (83.8 kg) (01/19 0600)  Filed Weights   08/21/12 1500 08/23/12 0500 08/24/12 0600  Weight: 181 lb 6.4 oz (82.283 kg) 192 lb 10.9 oz (87.4 kg) 184 lb 11.9 oz (83.8 kg)    Weight change: -7 lb 15 oz (-3.6 kg)   Hemodynamic parameters for last 24 hours:    Intake/Output from previous day: 01/18 0701 - 01/19 0700 In: 2251.2 [P.O.:1480; I.V.:571.2; IV Piggyback:200] Out: 7846 [NGEXB:2841; Chest Tube:350]  Intake/Output this shift:    Current Meds: Scheduled Meds:   . acetaminophen  1,000 mg Oral Q6H   Or  . acetaminophen (TYLENOL) oral liquid 160 mg/5 mL  975 mg Per Tube Q6H  . albuterol-ipratropium  2 puff Inhalation Q4H  . aspirin EC  325 mg Oral Daily   Or  . aspirin  324 mg Per Tube Daily  . bisacodyl  10 mg Oral Daily   Or  . bisacodyl  10 mg Rectal Daily  . cefUROXime (ZINACEF)  IV  1.5 g Intravenous Q12H  . docusate sodium  200 mg Oral Daily  . DULoxetine  60 mg Oral Daily  . enoxaparin  40 mg Subcutaneous QHS  . folic acid  1 mg Oral Daily  . insulin aspart  0-24 Units Subcutaneous Q4H  . insulin detemir  25 Units Subcutaneous Q1200    . metoprolol tartrate  12.5 mg Oral BID   Or  . metoprolol tartrate  12.5 mg Per Tube BID  . pantoprazole  40 mg Oral Daily  . simvastatin  40 mg Oral QPC breakfast  . sodium chloride  3 mL Intravenous Q12H  . Tamsulosin HCl  0.4 mg Oral Daily  . thiamine  100 mg Oral Daily   Continuous Infusions:   . sodium chloride Stopped (08/23/12 1000)  . lactated ringers 20 mL/hr at 08/23/12 0600   PRN Meds:.sodium chloride, levalbuterol, metoprolol, morphine injection, ondansetron (ZOFRAN) IV, oxyCODONE, sodium chloride  General appearance: alert, cooperative and no distress Neurologic: intact Heart: regular rate and rhythm, S1, S2 normal, no murmur, click, rub or gallop Lungs: diminished breath sounds bibasilar Abdomen: soft, non-tender; bowel sounds normal; no masses,  no organomegaly Extremities: extremities normal, atraumatic, no cyanosis or edema and Homans sign is negative, no sign of DVT Wound: sternum stable  Lab Results: CBC: Basename 08/24/12 0400 08/23/12 1731 08/23/12 1700  WBC 12.2* -- 13.8*  HGB 11.7* 12.9* --  HCT 34.9* 38.0* --  PLT 94* -- 95*   BMET:  Basename 08/24/12 0400 08/23/12 1731 08/23/12 0400  NA 134* 138 --  K 3.5 3.8 --  CL 97 100 --  CO2 30 -- 25  GLUCOSE 97 121* --  BUN 6 5* --  CREATININE 0.65 0.80 --  CALCIUM 8.7 -- 7.9*    PT/INR:  Basename 08/22/12 1330  LABPROT 15.7*  INR 1.28   Radiology: Dg Chest Portable 1 View In Am  08/24/2012  *RADIOLOGY REPORT*  Clinical Data: Postop cardiac surgery  PORTABLE CHEST - 1 VIEW  Comparison: Prior chest x-ray 08/23/2012  Findings: Interval removal of Swan-Ganz catheter.  Right IJ vascular sheath remains in unchanged position.  Stable left thoracostomy tube.  Epicardial pacing leads remain in place. Stable mild cardiomegaly.  Status post median sternotomy with evidence of multivessel CABG.  Left basilar opacity persists and may reflect atelectasis and potentially small residual pleural fluid. No  pneumothorax.  The remainder of the lungs are clear.  No acute osseous abnormality.  IMPRESSION:  1.  Interval removal of Swan-Ganz catheter.  Other support apparatus in stable and satisfactory position. 2.  Persistent left basilar opacity which may reflect atelectasis or small residual pleural fluid. 3.  The lungs are otherwise clear.   Original Report Authenticated By: Malachy Moan, M.D.    Dg Chest Portable 1 View In Am  08/23/2012  *RADIOLOGY REPORT*  Clinical Data: Chest pain and congestion.  PORTABLE CHEST - 1 VIEW  Comparison: 08/22/2012  Findings: Postoperative changes in the mediastinum and cervical spine are stable.  Interval removal of the endotracheal tube and enteric tube.  Swan-Ganz catheter, mediastinal drain, and left chest tube remain unchanged.  There appears to be atelectasis in the left lung base.  No residual or recurrent pneumothorax.  The right lung is clear.  IMPRESSION: Atelectasis in the left lung base.  Interval removal of endotracheal and enteric tubes.  Appliances are otherwise stable in position.   Original Report Authenticated By: Burman Nieves, M.D.    Dg Chest Portable 1 View  08/22/2012  *RADIOLOGY REPORT*  Clinical Data: Postop CABG.  PORTABLE CHEST - 1 VIEW  Comparison: 08/20/2012  Findings: 2.  Frontal views.  Endotracheal tube terminates 4.9 cm above carina.  Nasogastric tube extends beyond the  inferior aspect of the film.  Left-sided chest tube.  Right internal jugular Swan- Ganz catheter terminates at the pulmonary outflow tract or proximal most right pulmonary artery.  Interval median sternotomy. Normal heart size.  No pleural effusion or pneumothorax.  No congestive failure.  Clear lungs.  Mildly low lung volumes.  IMPRESSION: Interval median sternotomy, without acute disease.  Appropriate position of support apparatus, without pneumothorax.   Original Report Authenticated By: Jeronimo Greaves, M.D.      Assessment/Plan: S/P Procedure(s) (LRB): CORONARY ARTERY  BYPASS GRAFTING (CABG) (N/A) INTRAOPERATIVE TRANSESOPHAGEAL ECHOCARDIOGRAM (N/A) Mobilize Diuresis Diabetes control d/c tubes/lines Plan for transfer to step-down: see transfer orders     [name] B 08/24/2012 9:06 AM

## 2012-08-25 ENCOUNTER — Encounter (HOSPITAL_COMMUNITY): Payer: Self-pay | Admitting: Cardiothoracic Surgery

## 2012-08-25 ENCOUNTER — Inpatient Hospital Stay (HOSPITAL_COMMUNITY): Payer: Medicare Other

## 2012-08-25 LAB — CBC
HCT: 35 % — ABNORMAL LOW (ref 39.0–52.0)
Hemoglobin: 11.7 g/dL — ABNORMAL LOW (ref 13.0–17.0)
MCH: 30.8 pg (ref 26.0–34.0)
MCHC: 33.4 g/dL (ref 30.0–36.0)
MCV: 92.1 fL (ref 78.0–100.0)
Platelets: 117 10*3/uL — ABNORMAL LOW (ref 150–400)
RBC: 3.8 MIL/uL — ABNORMAL LOW (ref 4.22–5.81)
RDW: 12 % (ref 11.5–15.5)
WBC: 11.7 10*3/uL — ABNORMAL HIGH (ref 4.0–10.5)

## 2012-08-25 LAB — BASIC METABOLIC PANEL
BUN: 8 mg/dL (ref 6–23)
CO2: 28 mEq/L (ref 19–32)
Calcium: 9.1 mg/dL (ref 8.4–10.5)
Chloride: 94 mEq/L — ABNORMAL LOW (ref 96–112)
Creatinine, Ser: 0.57 mg/dL (ref 0.50–1.35)
GFR calc Af Amer: >90 mL/min (ref >90)
GFR calc non Af Amer: >90 mL/min (ref >90)
Glucose, Bld: 104 mg/dL — ABNORMAL HIGH (ref 70–99)
Potassium: 3.6 mEq/L (ref 3.5–5.1)
Sodium: 134 mEq/L — ABNORMAL LOW (ref 135–145)

## 2012-08-25 LAB — GLUCOSE, CAPILLARY
Glucose-Capillary: 105 mg/dL — ABNORMAL HIGH (ref 70–99)
Glucose-Capillary: 106 mg/dL — ABNORMAL HIGH (ref 70–99)
Glucose-Capillary: 184 mg/dL — ABNORMAL HIGH (ref 70–99)
Glucose-Capillary: 185 mg/dL — ABNORMAL HIGH (ref 70–99)
Glucose-Capillary: 191 mg/dL — ABNORMAL HIGH (ref 70–99)
Glucose-Capillary: 85 mg/dL (ref 70–99)

## 2012-08-25 MED ORDER — METOPROLOL TARTRATE 25 MG PO TABS: 25.0000 mg | ORAL_TABLET | Freq: Two times a day (BID) | ORAL | Status: DC

## 2012-08-25 MED ORDER — PNEUMOCOCCAL 13-VAL CONJ VACC IM SUSP: 0.5000 mL | INTRAMUSCULAR | Status: DC

## 2012-08-25 MED ORDER — FOLIC ACID 1 MG PO TABS: 1.0000 mg | ORAL_TABLET | Freq: Every day | ORAL | Status: DC

## 2012-08-25 MED ORDER — POLYETHYLENE GLYCOL 3350 17 G PO PACK
17.0000 g | PACK | Freq: Once | ORAL | Status: AC
  Administered 2012-08-25: 17 g via ORAL
  Filled 2012-08-25: qty 1

## 2012-08-25 MED ORDER — OXYCODONE-ACETAMINOPHEN 5-325 MG PO TABS: 1.0000 | ORAL_TABLET | ORAL | Status: DC | PRN

## 2012-08-25 MED ORDER — METOPROLOL TARTRATE 25 MG PO TABS
25.0000 mg | ORAL_TABLET | Freq: Two times a day (BID) | ORAL | Status: DC
  Administered 2012-08-25 – 2012-08-26 (×3): 25 mg via ORAL
  Filled 2012-08-25 (×4): qty 1

## 2012-08-25 MED ORDER — INSULIN ASPART 100 UNIT/ML ~~LOC~~ SOLN: 0.0000 [IU] | Freq: Every day | SUBCUTANEOUS | Status: DC

## 2012-08-25 MED ORDER — PNEUMOCOCCAL VAC POLYVALENT 25 MCG/0.5ML IJ INJ
0.5000 mL | INJECTION | INTRAMUSCULAR | Status: DC
  Filled 2012-08-25: qty 0.5

## 2012-08-25 MED ORDER — INSULIN ASPART 100 UNIT/ML ~~LOC~~ SOLN
0.0000 [IU] | Freq: Three times a day (TID) | SUBCUTANEOUS | Status: DC
Start: 2012-08-25 — End: 2012-08-26
  Administered 2012-08-26: 3 [IU] via SUBCUTANEOUS

## 2012-08-25 MED ORDER — INFLUENZA VIRUS VACC SPLIT PF IM SUSP: 0.5000 mL | INTRAMUSCULAR | Status: DC | PRN

## 2012-08-25 MED ORDER — LISINOPRIL 2.5 MG PO TABS
2.5000 mg | ORAL_TABLET | Freq: Every day | ORAL | Status: DC
  Filled 2012-08-25 (×2): qty 1

## 2012-08-25 MED ORDER — IPRATROPIUM-ALBUTEROL 18-103 MCG/ACT IN AERO: 2.0000 | INHALATION_SPRAY | Freq: Every day | RESPIRATORY_TRACT | Status: DC | PRN

## 2012-08-25 MED ORDER — GUAIFENESIN ER 600 MG PO TB12: 600.0000 mg | ORAL_TABLET | Freq: Two times a day (BID) | ORAL | Status: DC | PRN

## 2012-08-25 MED ORDER — IPRATROPIUM-ALBUTEROL 18-103 MCG/ACT IN AERO
2.0000 | INHALATION_SPRAY | Freq: Four times a day (QID) | RESPIRATORY_TRACT | Status: DC | PRN
  Filled 2012-08-25: qty 14.7

## 2012-08-25 MED ORDER — ASPIRIN 325 MG PO TBEC: 325.0000 mg | DELAYED_RELEASE_TABLET | Freq: Every day | ORAL | Status: DC

## 2012-08-25 MED FILL — Heparin Sodium (Porcine) Inj 1000 Unit/ML: INTRAMUSCULAR | Qty: 10 | Status: AC

## 2012-08-25 MED FILL — Sodium Chloride Irrigation Soln 0.9%: Qty: 3000 | Status: AC

## 2012-08-25 MED FILL — Heparin Sodium (Porcine) Inj 1000 Unit/ML: INTRAMUSCULAR | Qty: 30 | Status: AC

## 2012-08-25 MED FILL — Electrolyte-R (PH 7.4) Solution: INTRAVENOUS | Qty: 4000 | Status: AC

## 2012-08-25 MED FILL — Potassium Chloride Inj 2 mEq/ML: INTRAVENOUS | Qty: 40 | Status: AC

## 2012-08-25 MED FILL — Sodium Bicarbonate IV Soln 8.4%: INTRAVENOUS | Qty: 50 | Status: AC

## 2012-08-25 MED FILL — Sodium Chloride IV Soln 0.9%: INTRAVENOUS | Qty: 1000 | Status: AC

## 2012-08-25 MED FILL — Magnesium Sulfate Inj 50%: INTRAMUSCULAR | Qty: 10 | Status: AC

## 2012-08-25 MED FILL — Mannitol IV Soln 20%: INTRAVENOUS | Qty: 500 | Status: AC

## 2012-08-25 MED FILL — Lidocaine HCl IV Inj 20 MG/ML: INTRAVENOUS | Qty: 5 | Status: AC

## 2012-08-25 NOTE — Progress Notes (Signed)
CARDIAC REHAB PHASE I   PRE:  Rate/Rhythm: 99SR  BP:  Supine: 140/72  Sitting:   Standing:    SaO2: 89-90%RA  MODE:  Ambulation: 550 ft   POST:  Rate/Rhythem: 123ST  BP:  Supine:   Sitting: 142/70  Standing:    SaO2: 88%RA, had to put on 2L to get to 90% 0910-0948 Pt walked 550 ft on RA with rolling walker and minimal asst. Tolerated well. Did not rest even though I offered several times that we could stop. Denied SOB. Sats low at  88% upon return to room and did not come up with rest. Had to put on 2 L to keep sats at 90%. Encouraged IS. To recliner with call bell. Has rolling walker at home if needed.  [name]

## 2012-08-25 NOTE — Progress Notes (Signed)
[facility]  Subjective: Only complaint is chest wall soreness. Denies CP/SOB. He has been ambulation w/o difficulty.  Objective: Vital signs in last 24 hours: Temp:  [98.8 F (37.1 C)-100.1 F (37.8 C)] 99.5 F (37.5 C) (01/20 0509) Pulse Rate:  [84-101] 84  (01/20 0509) Resp:  [16-20] 18  (01/20 0509) BP: (123-145)/(63-79) 124/78 mmHg (01/20 0509) SpO2:  [88 %-97 %] 89 % (01/20 0805) FiO2 (%):  [2 %-28 %] 2 % (01/20 0421) Weight:  [180 lb 14.4 oz (82.056 kg)] 180 lb 14.4 oz (82.056 kg) (01/20 0509) Last BM Date:  (Pt unsure, preop)  Intake/Output from previous day: 01/19 0701 - 01/20 0700 In: 1285.8 [P.O.:1170; I.V.:115.8] Out: 3300 [Urine:3200; Chest Tube:100] Intake/Output this shift:    Medications Current Facility-Administered Medications  Medication Dose Route Frequency Provider Last Rate Last Dose  . 0.9 %  sodium chloride infusion  250 mL Intravenous PRN Delight Ovens, MD      . 0.9 %  sodium chloride infusion   Intravenous Continuous Delight Ovens, MD 10 mL/hr at 08/24/12 1304    . albuterol-ipratropium (COMBIVENT) inhaler 2 puff  2 puff Inhalation Q4H Ardelle Balls, PA   2 puff at 08/25/12 0805  . ALPRAZolam Prudy Feeler) tablet 0.25 mg  0.25 mg Oral Q6H PRN Delight Ovens, MD      . aspirin EC tablet 325 mg  325 mg Oral Daily Delight Ovens, MD   325 mg at 08/24/12 1001  . bisacodyl (DULCOLAX) EC tablet 10 mg  10 mg Oral Daily PRN Delight Ovens, MD   10 mg at 08/24/12 1000   Or  . bisacodyl (DULCOLAX) suppository 10 mg  10 mg Rectal Daily PRN Delight Ovens, MD      . dextrose 50 % solution 25 mL  25 mL Intravenous PRN Delight Ovens, MD      . docusate sodium (COLACE) capsule 200 mg  200 mg Oral Daily Delight Ovens, MD   200 mg at 08/24/12 1000  . DULoxetine (CYMBALTA) DR capsule 60 mg  60 mg Oral Daily Ardelle Balls, PA   60 mg at 08/24/12 1001  . enoxaparin (LOVENOX) injection 40 mg  40 mg  Subcutaneous Q24H Delight Ovens, MD   40 mg at 08/24/12 1002  . folic acid (FOLVITE) tablet 1 mg  1 mg Oral Daily Ardelle Balls, PA   1 mg at 08/24/12 1001  . furosemide (LASIX) tablet 40 mg  40 mg Oral Daily Delight Ovens, MD   40 mg at 08/24/12 1001  . guaiFENesin (MUCINEX) 12 hr tablet 600 mg  600 mg Oral Q12H PRN Delight Ovens, MD      . influenza  inactive virus vaccine (FLUZONE/FLUARIX) injection 0.5 mL  0.5 mL Intramuscular Prior to discharge Delight Ovens, MD      . insulin aspart (novoLOG) injection 0-15 Units  0-15 Units Subcutaneous TID WC Lenore Cordia, NP      . insulin aspart (novoLOG) injection 0-5 Units  0-5 Units Subcutaneous QHS Lenore Cordia, NP      . metoprolol tartrate (LOPRESSOR) tablet 25 mg  25 mg Oral BID Ardelle Balls, PA      . ondansetron [facility]) tablet 4 mg  4 mg Oral Q6H PRN Delight Ovens, MD       Or  . ondansetron [facility]) injection 4 mg  4 mg Intravenous Q6H PRN Delight Ovens,  MD      . oxyCODONE (Oxy IR/ROXICODONE) immediate release tablet 5-10 mg  5-10 mg Oral Q3H PRN Delight Ovens, MD   10 mg at 08/24/12 1852  . pantoprazole (PROTONIX) EC tablet 40 mg  40 mg Oral QAC breakfast Delight Ovens, MD   40 mg at 08/24/12 1001  . pneumococcal 23 valent vaccine (PNU-IMMUNE) injection 0.5 mL  0.5 mL Intramuscular Tomorrow-1000 Delight Ovens, MD      . polyethylene glycol (MIRALAX / GLYCOLAX) packet 17 g  17 g Oral Once Ardelle Balls, PA      . potassium chloride SA (K-DUR,KLOR-CON) CR tablet 20 mEq  20 mEq Oral BID Delight Ovens, MD   20 mEq at 08/24/12 2125  . simvastatin (ZOCOR) tablet 40 mg  40 mg Oral QPC breakfast Ardelle Balls, PA   40 mg at 08/24/12 1000  . sodium chloride 0.9 % injection 3 mL  3 mL Intravenous Q12H Delight Ovens, MD   3 mL at 08/24/12 2126  . sodium chloride 0.9 % injection 3 mL  3 mL Intravenous PRN Delight Ovens, MD      . Tamsulosin HCl [facility]) capsule 0.4 mg   0.4 mg Oral Daily Ardelle Balls, PA   0.4 mg at 08/24/12 1001  . thiamine (VITAMIN B-1) tablet 100 mg  100 mg Oral Daily Ardelle Balls, PA   100 mg at 08/24/12 1001  . traMADol (ULTRAM) tablet 50-100 mg  50-100 mg Oral Q4H PRN Delight Ovens, MD        PE: General appearance: alert, cooperative and no distress Lungs: clear to auscultation bilaterally Heart: regular rate and rhythm, S1, S2 normal, no murmur, click, rub or gallop Abdomen: soft, non-tender; bowel sounds normal; no masses,  no organomegaly Extremities: no LEE Pulses: 2+ radials, 1+ DPs Skin: warm and dry Neurologic: Grossly normal  Lab Results:   Basename 08/25/12 0544 08/24/12 0400 08/23/12 1731 08/23/12 1700  WBC 11.7* 12.2* -- 13.8*  HGB 11.7* 11.7* 12.9* --  HCT 35.0* 34.9* 38.0* --  PLT 117* 94* -- 95*   BMET  Basename 08/25/12 0544 08/24/12 0400 08/23/12 1731 08/23/12 0400  NA 134* 134* 138 --  K 3.6 3.5 3.8 --  CL 94* 97 100 --  CO2 28 30 -- 25  GLUCOSE 104* 97 121* --  BUN 8 6 5* --  CREATININE 0.57 0.65 0.80 --  CALCIUM 9.1 8.7 -- 7.9*   PT/INR  Basename 08/22/12 1330  LABPROT 15.7*  INR 1.28   Cholesterol No results found for this basename: CHOL in the last 72 hours Cardiac Enzymes No components found with this basename: TROPONIN:3, CKMB:3  Studies/Results: *RADIOLOGY REPORT* 08/25/12 Clinical Data: Followup CABG  CHEST - 2 VIEW  Comparison: Chest radiograph 08/14/2012  Findings: Sternotomy wires overlie stable cardiac silhouette.  There is left basilar atelectasis and small effusion which is  improved compared to prior. Interval removal of left chest tube.  No pneumothorax. Right lung is clear.  IMPRESSION:  Improved left basilar atelectasis and small effusion.  Original Report Authenticated By: Genevive Bi, M.D.   Assessment/Plan  Active Problems:  Diabetes mellitus  Hypertension  Hepatomegaly  Coronary atherosclerosis of native coronary artery  S/P CABG x  4  PAD (peripheral artery disease)  Plan: S/P CABG x 4. POD #3. CP free. F/u CXR today showed left basilar atelectasis and small effusion, which is improved compared to prior study. No pneumothorax. He has been  ambulating around the unit w/o difficulty. He has been mildly hypertensive and has had some sinus tach with rates in the 120's on telemetry. He is currently in NSR. Per CT surgery, his Lopressor will be increased from 12.5 mg to 25 mg PO, BID for better BP and HR control. He is also on ASA and a statin. ? the need to be on an ACE-I as well. His renal function is normal. Cardiologist to follow with further recommendation. Will continue to monitor.    LOS: 3 days    Brittainy M. Delmer Islam 08/25/2012 9:17 AM   Patient seen and examined. Agree with assessment and plan. Agree with increased Beta blocker. Will start low dose ACE-I and titrate as outpatient. Cardiac Rehab.   Lennette Bihari, MD, [facility] 08/25/2012 1:53 PM

## 2012-08-25 NOTE — Progress Notes (Addendum)
                   [address]            [name] [address]          [phone]      3 Days Post-Op Procedure(s) (LRB): CORONARY ARTERY BYPASS GRAFTING (CABG) (N/A) INTRAOPERATIVE TRANSESOPHAGEAL ECHOCARDIOGRAM (N/A)  Subjective: Patient about to eat breakfast. He states he feeling fairly well  Objective: Vital signs in last 24 hours: Temp:  [98 F (36.7 C)-100.1 F (37.8 C)] 99.5 F (37.5 C) (01/20 0509) Pulse Rate:  [76-101] 84  (01/20 0509) Cardiac Rhythm:  [-] Normal sinus rhythm (01/19 1927) Resp:  [14-20] 18  (01/20 0509) BP: (115-145)/(63-79) 124/78 mmHg (01/20 0509) SpO2:  [88 %-97 %] 93 % (01/20 0509) FiO2 (%):  [2 %-40 %] 2 % (01/20 0421) Weight:  [82.056 kg (180 lb 14.4 oz)] 82.056 kg (180 lb 14.4 oz) (01/20 0509)  Pre op weight  82 kg Current Weight  08/25/12 82.056 kg (180 lb 14.4 oz)      Intake/Output from previous day: 01/19 0701 - 01/20 0700 In: 1285.8 [P.O.:1170; I.V.:115.8] Out: 3300 [Urine:3200; Chest Tube:100]   Physical Exam:  Cardiovascular: RRR, no murmurs, gallops, or rubs. Pulmonary: Clear to auscultation bilaterally; no rales, wheezes, or rhonchi. Abdomen: Soft, non tender, bowel sounds present. Extremities: Mild bilateral lower extremity edema. Wounds: Clean and dry.  No erythema or signs of infection.  Lab Results: CBC: Basename 08/25/12 0544 08/24/12 0400  WBC 11.7* 12.2*  HGB 11.7* 11.7*  HCT 35.0* 34.9*  PLT 117* 94*   BMET:  Basename 08/25/12 0544 08/24/12 0400  NA 134* 134*  K 3.6 3.5  CL 94* 97  CO2 28 30  GLUCOSE 104* 97  BUN 8 6  CREATININE 0.57 0.65  CALCIUM 9.1 8.7    PT/INR:  Lab Results  Component Value Date   INR 1.28 08/22/2012   INR 0.92 08/20/2012   ABG:  INR: Will add last result for INR, ABG once components are confirmed Will add last 4 CBG results once components are confirmed  Assessment/Plan:  1. CV - SR. On Lopressor 12.5 bid.Will increase to 25 bid for better blood  pressure and heart rate control 2.  Pulmonary - CXR appears to show stable cardiomegaly, no pneumothorax, small left pleural effusion/atelectasis.Encourage incentive spirometer and wean off oxygen 3. Volume Overload - Continue with Lasix 4.  Acute blood loss anemia - H and H stable at 11.7 and 35 5.Dm-CBGs 105/85/106.Pre op HGA1C 10.7. On Insulin only 6.Thrombocytopenia-platelets up to 117,000 7.Remove EPW in am 8.LOC constipation   ZIMMERMAN,DONIELLE MPA-C 08/25/2012,7:21 AM   poss home in one or two days I have seen and examined August Luz and agree with the above assessment  and plan.  Delight Ovens MD Beeper [phone] Office [phone] 08/25/2012 12:22 PM

## 2012-08-26 ENCOUNTER — Encounter: Payer: Self-pay | Admitting: Cardiothoracic Surgery

## 2012-08-26 LAB — BASIC METABOLIC PANEL
BUN: 12 mg/dL (ref 6–23)
CO2: 28 mEq/L (ref 19–32)
Calcium: 9.5 mg/dL (ref 8.4–10.5)
Creatinine, Ser: 0.59 mg/dL (ref 0.50–1.35)
Glucose, Bld: 183 mg/dL — ABNORMAL HIGH (ref 70–99)

## 2012-08-26 LAB — GLUCOSE, CAPILLARY: Glucose-Capillary: 171 mg/dL — ABNORMAL HIGH (ref 70–99)

## 2012-08-26 MED ORDER — LISINOPRIL 10 MG PO TABS: 10.0000 mg | ORAL_TABLET | Freq: Every day | ORAL | Status: DC

## 2012-08-26 MED ORDER — POTASSIUM CHLORIDE CRYS ER 20 MEQ PO TBCR
20.0000 meq | EXTENDED_RELEASE_TABLET | Freq: Every day | ORAL | Status: DC
Start: 2012-08-26 — End: 2012-08-26
  Administered 2012-08-26: 20 meq via ORAL

## 2012-08-26 MED ORDER — LISINOPRIL 10 MG PO TABS
10.0000 mg | ORAL_TABLET | Freq: Every day | ORAL | Status: DC
  Administered 2012-08-26: 10 mg via ORAL
  Filled 2012-08-26: qty 1

## 2012-08-26 MED ORDER — METFORMIN HCL 500 MG PO TABS
1000.0000 mg | ORAL_TABLET | Freq: Two times a day (BID) | ORAL | Status: DC
  Filled 2012-08-26 (×2): qty 2

## 2012-08-26 MED ORDER — ENOXAPARIN SODIUM 40 MG/0.4ML ~~LOC~~ SOLN: 40.0000 mg | SUBCUTANEOUS | Status: DC

## 2012-08-26 MED ORDER — POTASSIUM CHLORIDE CRYS ER 20 MEQ PO TBCR
20.0000 meq | EXTENDED_RELEASE_TABLET | Freq: Once | ORAL | Status: AC
  Administered 2012-08-26: 20 meq via ORAL

## 2012-08-26 NOTE — Progress Notes (Signed)
SATURATION QUALIFICATIONS: (This note is used to comply with regulatory documentation for home oxygen)  Patient Saturations on Room Air at Rest = 87%  Patient Saturations on Room Air while Ambulating = %  Patient Saturations on 2 Liters of oxygen while Ambulating = 87%   Please briefly explain why patient needs home oxygen: Took O2 3L to keep pt's O2 sat greater than 90% with walking. O2 sat after walk on 3L 94%.

## 2012-08-26 NOTE — Progress Notes (Signed)
EPW and CTS DC'd per MD order and protocol.  Pt tolerated well, no bleeding or ectopy noted, sites remain approximated, steris and betadine applied.  Instructed to remain in bed x 1 hr, frequent vitals initiated.  Will continue to monitor closely. Guerline Happ Genevea  

## 2012-08-26 NOTE — Progress Notes (Addendum)
                   [address]            [place] [address]          [phone]      4 Days Post-Op Procedure(s) (LRB): CORONARY ARTERY BYPASS GRAFTING (CABG) (N/A) INTRAOPERATIVE TRANSESOPHAGEAL ECHOCARDIOGRAM (N/A)  Subjective: Patient had a bowel movement;no complaints this am  Objective: Vital signs in last 24 hours: Temp:  [98.7 F (37.1 C)-98.9 F (37.2 C)] 98.7 F (37.1 C) (01/21 0642) Pulse Rate:  [98-107] 107  (01/21 0642) Cardiac Rhythm:  [-] Normal sinus rhythm (01/20 1920) Resp:  [18-20] 20  (01/21 0642) BP: (137-147)/(81-83) 147/83 mmHg (01/21 0642) SpO2:  [87 %-92 %] 92 % (01/21 0717) Weight:  [79.2 kg (174 lb 9.7 oz)] 79.2 kg (174 lb 9.7 oz) (01/21 0642)  Pre op weight  82 kg Current Weight  08/26/12 79.2 kg (174 lb 9.7 oz)      Intake/Output from previous day: 01/20 0701 - 01/21 0700 In: -  Out: 1975 [Urine:1975]   Physical Exam:  Cardiovascular: Slightly tachy Pulmonary: Clear to auscultation bilaterally; no rales, wheezes, or rhonchi. Abdomen: Soft, non tender, bowel sounds present. Extremities: Mild bilateral lower extremity edema. Wounds: Clean and dry.  No erythema or signs of infection.  Lab Results: CBC:  Basename 08/25/12 0544 08/24/12 0400  WBC 11.7* 12.2*  HGB 11.7* 11.7*  HCT 35.0* 34.9*  PLT 117* 94*   BMET:   Basename 08/26/12 0545 08/25/12 0544  NA 134* 134*  K 3.9 3.6  CL 92* 94*  CO2 28 28  GLUCOSE 183* 104*  BUN 12 8  CREATININE 0.59 0.57  CALCIUM 9.5 9.1    PT/INR:  Lab Results  Component Value Date   INR 1.28 08/22/2012   INR 0.92 08/20/2012   ABG:  INR: Will add last result for INR, ABG once components are confirmed Will add last 4 CBG results once components are confirmed  Assessment/Plan:  1. CV - SR. On Lopressor 25 bid and Lisinopril 2.5 daily. Will increase Lisinopril to 10 daily for better blood pressure control. 2.  Pulmonary - Encourage incentive spirometer and wean off  oxygen 3. Volume Overload - Continue with Lasix 4.  Acute blood loss anemia - H and H stable at 11.7 and 35 5.DM-CBGs 185/191/171.Pre op HGA1C 10.7. On Insulin PRN only.Will restart Metformin 6.Thrombocytopenia-last platelets up to 117,000 7.Remove EPW  8.Supplement potassium 9.Per Dr. Tyrone Sage, discharge later today.  ZIMMERMAN,DONIELLE MPA-C 08/26/2012,7:32 AM   home later today I have seen and examined [name] and agree with the above assessment  and plan.  Delight Ovens MD Beeper [phone] Office [phone] 08/26/2012 9:11 AM

## 2012-08-26 NOTE — Progress Notes (Signed)
[facility]  Subjective: Doing well. No complaints. Denies CP/SOB. Tolerating cardiac rehab ok.  Objective: Vital signs in last 24 hours: Temp:  [98.7 F (37.1 C)-98.9 F (37.2 C)] 98.7 F (37.1 C) (01/21 0642) Pulse Rate:  [98-107] 107  (01/21 0642) Resp:  [18-20] 20  (01/21 0642) BP: (137-147)/(81-83) 147/83 mmHg (01/21 0642) SpO2:  [87 %-92 %] 92 % (01/21 0717) Weight:  [174 lb 9.7 oz (79.2 kg)] 174 lb 9.7 oz (79.2 kg) (01/21 1610) Last BM Date:  (pre op)  Intake/Output from previous day: 01/20 0701 - 01/21 0700 In: -  Out: 1975 [Urine:1975] Intake/Output this shift:    Medications Current Facility-Administered Medications  Medication Dose Route Frequency Provider Last Rate Last Dose  . 0.9 %  sodium chloride infusion  250 mL Intravenous PRN Delight Ovens, MD      . 0.9 %  sodium chloride infusion   Intravenous Continuous Delight Ovens, MD 10 mL/hr at 08/24/12 1304    . albuterol-ipratropium (COMBIVENT) inhaler 2 puff  2 puff Inhalation Q6H PRN Adolm Joseph, RRT      . ALPRAZolam Prudy Feeler) tablet 0.25 mg  0.25 mg Oral Q6H PRN Delight Ovens, MD      . aspirin EC tablet 325 mg  325 mg Oral Daily Delight Ovens, MD   325 mg at 08/25/12 1013  . bisacodyl (DULCOLAX) EC tablet 10 mg  10 mg Oral Daily PRN Delight Ovens, MD   10 mg at 08/24/12 1000   Or  . bisacodyl (DULCOLAX) suppository 10 mg  10 mg Rectal Daily PRN Delight Ovens, MD      . dextrose 50 % solution 25 mL  25 mL Intravenous PRN Delight Ovens, MD      . docusate sodium (COLACE) capsule 200 mg  200 mg Oral Daily Delight Ovens, MD   200 mg at 08/25/12 1013  . DULoxetine (CYMBALTA) DR capsule 60 mg  60 mg Oral Daily Ardelle Balls, PA   60 mg at 08/25/12 1012  . enoxaparin (LOVENOX) injection 40 mg  40 mg Subcutaneous Q24H Donielle M Zimmerman, PA      . folic acid (FOLVITE) tablet 1 mg  1 mg Oral Daily Ardelle Balls, PA   1 mg at  08/25/12 1012  . furosemide (LASIX) tablet 40 mg  40 mg Oral Daily Delight Ovens, MD   40 mg at 08/25/12 1014  . guaiFENesin (MUCINEX) 12 hr tablet 600 mg  600 mg Oral Q12H PRN Delight Ovens, MD      . influenza  inactive virus vaccine (FLUZONE/FLUARIX) injection 0.5 mL  0.5 mL Intramuscular Prior to discharge Delight Ovens, MD      . insulin aspart (novoLOG) injection 0-15 Units  0-15 Units Subcutaneous TID WC Lenore Cordia, NP   3 Units at 08/26/12 [phone]  . insulin aspart (novoLOG) injection 0-5 Units  0-5 Units Subcutaneous QHS Lenore Cordia, NP      . lisinopril (PRINIVIL,ZESTRIL) tablet 10 mg  10 mg Oral Daily Donielle Margaretann Loveless, PA      . metFORMIN (GLUCOPHAGE) tablet 1,000 mg  1,000 mg Oral BID WC Donielle Margaretann Loveless, PA      . metoprolol tartrate (LOPRESSOR) tablet 25 mg  25 mg Oral BID Ardelle Balls, PA   25 mg at 08/25/12 2203  . ondansetron (ZOFRAN) tablet 4 mg  4 mg Oral Q6H PRN Delight Ovens,  MD       Or  . ondansetron (ZOFRAN) injection 4 mg  4 mg Intravenous Q6H PRN Delight Ovens, MD      . oxyCODONE (Oxy IR/ROXICODONE) immediate release tablet 5-10 mg  5-10 mg Oral Q3H PRN Delight Ovens, MD   5 mg at 08/25/12 2203  . pantoprazole (PROTONIX) EC tablet 40 mg  40 mg Oral QAC breakfast Delight Ovens, MD   40 mg at 08/24/12 1001  . pneumococcal 23 valent vaccine (PNU-IMMUNE) injection 0.5 mL  0.5 mL Intramuscular Tomorrow-1000 Delight Ovens, MD      . potassium chloride SA (K-DUR,KLOR-CON) CR tablet 20 mEq  20 mEq Oral Once Ardelle Balls, PA      . potassium chloride SA (K-DUR,KLOR-CON) CR tablet 20 mEq  20 mEq Oral Daily Donielle Margaretann Loveless, PA      . simvastatin (ZOCOR) tablet 40 mg  40 mg Oral QPC breakfast Ardelle Balls, PA   40 mg at 08/25/12 1014  . sodium chloride 0.9 % injection 3 mL  3 mL Intravenous Q12H Delight Ovens, MD   3 mL at 08/25/12 2203  . sodium chloride 0.9 % injection 3 mL  3 mL Intravenous PRN  Delight Ovens, MD      . Tamsulosin HCl [facility]) capsule 0.4 mg  0.4 mg Oral Daily Ardelle Balls, PA   0.4 mg at 08/25/12 1012  . thiamine (VITAMIN B-1) tablet 100 mg  100 mg Oral Daily Ardelle Balls, PA   100 mg at 08/25/12 1012  . traMADol (ULTRAM) tablet 50-100 mg  50-100 mg Oral Q4H PRN Delight Ovens, MD        PE: General appearance: alert, cooperative and no distress Lungs: decreased breath sounds at bilateral bases, upper lung fields clear bilaterally Heart: fast rate, regular rhythm Extremities: no LEE Pulses: 2+ and symmetric Skin: warm and dry Neurologic: Grossly normal  Lab Results:   Basename 08/25/12 0544 08/24/12 0400 08/23/12 1731 08/23/12 1700  WBC 11.7* 12.2* -- 13.8*  HGB 11.7* 11.7* 12.9* --  HCT 35.0* 34.9* 38.0* --  PLT 117* 94* -- 95*   BMET  Basename 08/26/12 0545 08/25/12 0544 08/24/12 0400  NA 134* 134* 134*  K 3.9 3.6 3.5  CL 92* 94* 97  CO2 28 28 30   GLUCOSE 183* 104* 97  BUN 12 8 6   CREATININE 0.59 0.57 0.65  CALCIUM 9.5 9.1 8.7     Assessment/Plan  Active Problems:  Diabetes mellitus  Hypertension  Hepatomegaly  Coronary atherosclerosis of native coronary artery  S/P CABG x 4  PAD (peripheral artery disease)  Plan:  S/P CABG x 4. POD #4. CP free. He is enrolled in cardiac rehab. Pt continues to be mildly hypertensive (SBPs in the upper 140's) and telemetry shows sinus tachycardia with rates in the low 100's. His Lopressor was increased yesterday to 25 mg BID. He was started on a low dose ACE-I yesterday, 2.5 mg lisinopril. Per CT surgery, will increase to 10 mg daily for better BP control. He is also on ASA and a statin. BG elevated today at 183 and is being followed by CT surgery. He will restart Metformin today. We will continue to follow until d/c. MD to follow with further recommendation.    LOS: 4 days    Damin Salido M. Sharol Harness, PA-C 08/26/2012 8:29 AM

## 2012-08-26 NOTE — Progress Notes (Signed)
CARDIAC REHAB PHASE I   PRE:  Rate/Rhythm: 117 ST  BP:  Supine:   Sitting: 132/80  Standing:    SaO2: 87 RA 91 2L  MODE:  Ambulation: 700 ft   POST:  Rate/Rhythem: 114 ST BP:  Supine:   Sitting: 134/80  Standing:    SaO2: 94 3L 1015-1115  On arrival pt on room air, sat 87%. O2 applied 2L sat 91%. Pt able to walk without assistance. O2 sat after 150 feet on 2L 87%, O2 increased to 3L sat improved to 90- 91%. Walked pt on 3L sat after walk 94%. Completed discharge education with pt. He voices understanding. He agrees to [employer]. CRP in [place], will send referral.  Beatrix Fetters

## 2012-08-26 NOTE — Progress Notes (Signed)
Pt sats on RA sitting 85-86%.  Put back on 2L, sats up to 94%.  Will continue to monitor, encouraged IS. [facility] Filter

## 2012-08-26 NOTE — Discharge Summary (Signed)
Physician Discharge Summary  Patient ID: [name] MRN: [id number] DOB/AGE: April 24, 1961 52 y.o.  Admit date: 08/22/2012 Discharge date: 08/26/2012  Admission Diagnoses: 1.Multivessel CAD 2.History of diabetes mellitus 3.History of hypertension 4.History of PAD (peripheral artery disease) 5.History of COPD 6.History of tobacco abuse 7.History of GERD  Discharge Diagnoses:  1.Multivessel CAD 2.History of diabetes mellitus 3.History of hypertension 4.History of PAD (peripheral artery disease) 5.History of COPD 6.History of tobacco abuse 7.History of GERD 8.History of ABL anemia 9.History of thrombocytopenia   Procedure (s):  1.Cardiac catheterization done by Dr. Rennis Golden on 08/19/2012: Left Ventriculography:  EF: 60-65%  Wall Motion: normal  Coronary Angiographic Data:  Left Main: 4.0 vessel, tapers to 2.0 distally with heavy eccentric calcification (50% stenosis)  Left Anterior Descending (LAD): There is a tubular 50-60% proximal LAD stenosis, however, the distal vessel is preserved and reaches around the apex.  1st diagonal (D1): Moderate sized vessel with 80% ostial stenosis.  Circumflex (LCx): There is 80% calcified ostial stenosis of the LCX - the vessel appears to abruptly occluded in the mid-distal segment, with numerous OM vessels and collaterals thereafter.  Ramus Intermedius: True ramus vessel with moderate proximal disease  Right Coronary Artery: Dominant vessel. Heavily calcified throughout. There is a proximal 80% stenosis and a distal 90% stenosis prior to the genu.  posterior descending artery: extensive distribution, 2 large branches, mild to moderate ostial disease of the first branch.  posterior lateral branch: No significant stenoses. LIMA - Non-selective visualization of the LIMA demonstrates it may be suitable for bypass  2.INTRAOPERATIVE TRANSESOPHAGEAL ECHOCARDIOGRAM , MEDIAN STERNOTOMY for  CORONARY ARTERY BYPASS GRAFTING (CABG) x 4 (LIMA to LAD, SVG to  Diagonal1, SVG to RAMUS INTERMEDIATE, and SVG to RCA) using left internal mammary artery and EVH from LEFT THIGH and LOWER LEG by Dr. Tyrone Sage on 08/22/2012.  History of Presenting Illness: This is a 52 year old disabled, Caucasian male with long-standing tobacco abuse and poorly controlled diabetes. He notes that 2 years ago a lung nodule was noted on a CT scan of the chest. Recently, he returned for followup CT to evaluate the nodule, the nodule had disappeared but was commented that he had significant calcification of the left main coronary vessels. He was referred to cardiology and cardiac catheterization was performed on 08/19/2012.. The patient has no definite history of myocardial infarction. He does have known underlying COPD, shortness of breath with exertion and fatigue. He denies definite angina. Results showed 50% stenosis of the left main as well as stenosis of the circumflex, ramus intermediate, and right coronary arteries. A cardiothoracic consultation was obtained with Dr. Tyrone Sage for the consideration of coronary artery bypass grafting surgery. Potential risks, complications, and benefits of the surgery were discussed with the patient and he agreed to proceed. Pre operative carotid duplex US showed no significant internal carotid artery stenosis bilaterally. ABI's were 0.5 on the right and 0.7 on the left. The patient underwent a CABG x 4 on 1/17.  Brief Hospital Course:  He was extubated with difficulty later the evening of surgery. Theone Murdoch, a line, chest tubes and foley were all removed early in his post operative course. He remained afebrile and hemodynamically stable. He was started on Lopressor. He was volume overloaded and diuresed accordingly. He was weaned off the Insulin drip. He was restarted on Metformin as he had taken pre operatively with good control of his glucose. His Inulin will slowly be resumed. His pre op HGA1C was 10.7. He will require follow up after discharge.  He did  have mild ABL anemia. His last H and H was 11.7 and 35. He also had thrombocytopenia. His last platelet count was up to 117,000. He was felt surgically stable for transfer from the ICU to PCTU for further convalescence on 08/24/2012. He has been tolerating a diet and has had a bowel movement. He did have some complaints of claudication with ambulation. He will need an outpatient lower extremity arteriogram in the future. He has been on 2 liters of oxygen via [place]. We will attempt to wean him off of this, but he may need supplemental oxygen upon discharge. Epicardial pacing wires and chest tube sutures will be removed. Per Dr. Tyrone Sage, will discharge later today.  Latest Vital Signs: Blood pressure 147/83, pulse 107, temperature 98.7 F (37.1 C), temperature source Oral, resp. rate 20, height 5\' 11"  (1.803 m), weight 79.2 kg (174 lb 9.7 oz), SpO2 92.00%.  Physical Exam: Cardiovascular: Slightly tachy  Pulmonary: Clear to auscultation bilaterally; no rales, wheezes, or rhonchi.  Abdomen: Soft, non tender, bowel sounds present.  Extremities: Mild bilateral lower extremity edema.  Wounds: Clean and dry. No erythema or signs of infection.   Discharge Condition:Stable  Recent laboratory studies:  Lab Results  Component Value Date   WBC 11.7* 08/25/2012   HGB 11.7* 08/25/2012   HCT 35.0* 08/25/2012   MCV 92.1 08/25/2012   PLT 117* 08/25/2012   Lab Results  Component Value Date   NA 134* 08/26/2012   K 3.9 08/26/2012   CL 92* 08/26/2012   CO2 28 08/26/2012   CREATININE 0.59 08/26/2012   GLUCOSE 183* 08/26/2012      Diagnostic Studies:  Dg Chest 2 View  08/25/2012  *RADIOLOGY REPORT*  Clinical Data: Followup CABG  CHEST - 2 VIEW  Comparison: Chest radiograph 08/14/2012  Findings: Sternotomy wires overlie stable cardiac silhouette. There is left basilar atelectasis and small effusion which is improved compared to prior.  Interval removal of left chest tube. No pneumothorax.  Right lung is clear.   IMPRESSION: Improved left basilar atelectasis and small effusion.   Original Report Authenticated By: Genevive Bi, M.D.     Discharge Orders    Future Appointments: Provider: Department: Dept Phone: Center:   09/16/2012 11:00 AM Vevelyn Royals, RD [facility] [phone] NDM   09/18/2012 12:00 PM Delight Ovens, MD [facility] [place] [phone] [facility]   10/06/2012 8:00 AM Nira Retort, NP [facility] [phone] [facility]     Future Orders Please Complete By Expires   Ambulatory referral to Nutrition and Diabetic Education      Comments:   A1c 10.7% (08/20/12).  S/P CABG.  PCP Dr. Juanetta Gosling.  Took insulin prior to admission.  Patient: Please call the [facility] after discharge to schedule an appointment for diabetes education if you do not hear from the center before discharge  [phone]      Discharge Medications:   Medication List     As of 08/26/2012  8:34 AM    TAKE these medications         albuterol-ipratropium 18-103 MCG/ACT inhaler   Commonly known as: COMBIVENT   Inhale 2 puffs into the lungs every 4 (four) hours.      aspirin 325 MG EC tablet   Take 1 tablet (325 mg total) by mouth daily.      CYMBALTA 60 MG capsule   Generic drug: DULoxetine   Take 60  mg by mouth daily.      dexlansoprazole 60 MG capsule   Commonly known as: DEXILANT   Take 1 capsule (60 mg total) by mouth daily.      folic acid 1 MG tablet   Commonly known as: FOLVITE   Take 1 tablet (1 mg total) by mouth daily. For one month then stop.      guaiFENesin 600 MG 12 hr tablet   Commonly known as: MUCINEX   Take 1 tablet (600 mg total) by mouth every 12 (twelve) hours as needed for congestion.      lisinopril 10 MG tablet   Commonly known as: PRINIVIL,ZESTRIL   Take 1 tablet (10 mg total) by mouth daily.      metFORMIN 1000 MG tablet   Commonly  known as: GLUCOPHAGE   Take 1,000 mg by mouth 2 (two) times daily.      metoprolol tartrate 25 MG tablet   Commonly known as: LOPRESSOR   Take 1 tablet (25 mg total) by mouth 2 (two) times daily.      NOVOLOG FLEXPEN 100 UNIT/ML injection   Generic drug: insulin aspart   Inject 10-40 Units into the skin daily. Take 10 units in the morning, and take 40 units at bedtime      oxyCODONE-acetaminophen 5-325 MG per tablet   Commonly known as: PERCOCET/ROXICET   Take 1 tablet by mouth every 4 (four) hours as needed for pain. For pain      simvastatin 40 MG tablet   Commonly known as: ZOCOR   Take 40 mg by mouth daily after breakfast.      Tamsulosin HCl 0.4 MG Caps   Commonly known as: FLOMAX   Take 0.4 mg by mouth daily.       The patient has been discharged on:   1.Beta Blocker:  Yes [  x ]                              No   [   ]                              If No, reason:  2.Ace Inhibitor/ARB: Yes [ x  ]                                     No  [    ]                                     If No, reason:  3.Statin:   Yes [x   ]                  No  [   ]                  If No, reason:  4.Ecasa:  Yes  [ x  ]                  No   [   ]                  If No, reason:  Follow Up Appointments:     Follow-up Information    Follow up with  Chrystie Nose, MD. (Call for a follow up appointment for 2 weeks)    Contact information:   [address] [place] [address] [phone]       Follow up with GERHARDT,EDWARD B, MD. (PA/LAT CXR to be taken (at [facility] which is in the same building as Dr. Dennie Maizes office) on 09/18/2012 at 11:00 am; Appointment with Dr. Tyrone Sage is on 09/18/2012 at 12:00 pm)    Contact information:   [address] [employer] [address] [place] [address] [phone]       Follow up with HAWKINS,EDWARD L, MD. (Call for a follow up appointment for further diabetes management and HGA1C surveillance )    Contact information:    [address] [place] [address] [phone]          Signed: Hinda Lindor MPA-C 08/26/2012, 8:34 AM

## 2012-08-27 NOTE — Care Management Note (Signed)
    Page 1 of 1   08/27/2012     5:05:31 PM   CARE MANAGEMENT NOTE 08/27/2012  Patient:  [name], [name]   Account Number:  [id number]  Date Initiated:  08/26/2012  Documentation initiated by:  Ayzia Day  Subjective/Objective Assessment:   PT S/P CABG ON 08/22/12.  PTA, PT INDEPENDENT, LIVES WITH SPOUSE.     Action/Plan:   WIFE TO PROVIDE CARE AT DC.  WILL NEED HOME O2 AS CONTINUES TO DESATURATE WITH AMBULATION.   Anticipated DC Date:  08/26/2012   Anticipated DC Plan:  HOME/SELF CARE      DC Planning Services  CM consult      Choice offered to / List presented to:     DME arranged  [facility]      DME agency  [facility]        Status of service:  Completed, signed off Medicare Important Message given?   (If response is "NO", the following Medicare IM given date fields will be blank) Date Medicare IM given:   Date Additional Medicare IM given:    Discharge Disposition:  HOME/SELF CARE  Per UR Regulation:  Reviewed for med. necessity/level of care/duration of stay  If discussed at Long Length of Stay Meetings, dates discussed:    Comments:

## 2012-08-27 NOTE — Op Note (Signed)
[name], OGAWA NO.:  [id number]  MEDICAL RECORD NO.:  [id number]  LOCATION:  2017                         FACILITY:  [facility]  PHYSICIAN:  Sheliah Plane, MD    DATE OF BIRTH:  1961/07/12  DATE OF PROCEDURE:  08/22/2012 DATE OF DISCHARGE:  08/26/2012                              OPERATIVE REPORT   PREOPERATIVE DIAGNOSIS:  Three-vessel coronary occlusive disease with left main obstruction.  POSTOPERATIVE DIAGNOSES:  Three-vessel coronary occlusive disease with left main obstruction.  PROCEDURE:  Coronary artery bypass grafting x4 with left internal mammary to the left anterior descending coronary artery, reverse saphenous vein graft to the diagonal coronary artery, reverse saphenous vein graft to the intermediate coronary artery, reverse saphenous vein graft to the posterior descending coronary artery with left thigh and calf endovein harvesting.  SURGEON:  Sheliah Plane, MD  FIRST ASSISTANT:  Doree Fudge, PA  BRIEF HISTORY:  The patient is a 52 year old male with poorly controlled diabetes and continued smoking history, who has been limited by increasing shortness of breath with exertion and chest discomfort.  His workup was primarily precipitated by CT scan to evaluate previous found lung nodule, and significant amount of calcium in his coronary arteries led to referral to Cardiology.  Cardiac catheterization revealed a 60% left main, 80% LAD, 80% intermediate, a small nondominant circ system with diffuse disease, and 70% stenosis of the right coronary artery. Overall ventricular function appeared preserved because of the patient's symptoms and evidence of left main obstruction, and coronary artery bypass grafting was recommended.  The patient was strongly counseled about control of his diabetes and complete abstinence from cigarette use.  Risks and options of surgery were discussed in detail.  He agreed and signed informed  consent.  DESCRIPTION OF PROCEDURE:  With Swan-Ganz and arterial line monitors were placed.  The patient underwent general endotracheal anesthesia without incident.  The chest and legs prepped with Betadine and draped in usual sterile manner.  Using the [employer] endovein harvesting system, vein was harvested from the left thigh and calf was of good quality and caliber.  Median sternotomy was performed.  Left internal mammary artery was dissected down as pedicle graft.  The distal artery was divided and had good free flow.  Pericardium was opened.  The patient had evidence of left ventricular hypertrophy, but overall ventricular function appeared preserved.  This was confirmed by TEE by Dr. Randa Evens.  The patient was systemically heparinized.  The ascending aorta was cannulated.  The right atrium was cannulated.  An aortic root vent cardioplegia needle was introduced into the ascending aorta.  The patient was placed on cardiopulmonary bypass 2.4 L/min/m2.  Sites of anastomosis were dissected out of the epicardium.  The patient's body temperature was cooled to 32 degrees.  Aortic crossclamp was applied and 700 mL of cold blood potassium cardioplegia was administered antegrade with diastolic arrest of the heart.  Myocardial septal temperatures were mapped throughout crossclamp.  Attention was turned first to the intermediate coronary artery, which was intramyocardial line was the predominant supplier of the lateral wall of the heart.  The vessel was opened and admitted a 1.5 mm probe.  Using a running  7-0 Prolene, distal anastomosis was performed.  The very distal circumflex proper was a very small vessel as it arose from the AV groove and was too small for bypass.  The first diagonal was opened and admitted a 1 mm probe distally.  Using a running 7-0 Prolene, distal anastomosis was performed.  Attention was then turned to the posterior descending, which had a high takeoff off the right  supplying the inferior surface of the heart.  The vessel was opened and admitted 1.5 mm probe.  Using a running 7-0 Prolene, distal anastomosis was performed.  Attention was then turned to the left anterior descending coronary artery, where between the mid and distal third of the vessel was opened and admitted a 1.5 mm probe proximally and a 1 mm probe distally.  Using a running 8-0 Prolene, left internal mammary artery was anastomosed to the left anterior descending coronary artery.  With crossclamp stent in place, three punch aortotomies were performed and each of the 3 vein grafts were anastomosed to the ascending aorta.  Air was evacuated from the grafts and ascending aorta.  Bulldog was removed from the mammary artery with prompt rise in myocardial septal temperature.  Aortic crossclamp was then removed with total cross-clamp time of 85 minutes.  The patient required electrode defibrillation to return to a slow sinus rhythm and was atrially paced to increase rate.  Sites of anastomosis were inspected and free of bleeding.  He was then ventilated and weaned from cardiopulmonary bypass without difficulty.  He remained hemodynamically stable.  He was decannulated in usual fashion.  Protamine sulfate was administered with operative field hemostatic.  Atrial and ventricular pacing wires had been applied.  Graft markers were applied.  The left pleural tube and a Blake mediastinal drain were left in place. Pericardium was loosely reapproximated.  Sternum was closed with #6 stainless steel wire.  Fascia was closed with interrupted 0 Vicryl, running 3-0 Vicryl in subcutaneous tissue, 4-0 subcuticular stitch in skin edges.  Dry dressings were applied.  Sponge and needle count was reported as correct at completion of the procedure.  The patient did not require any intraoperative packed blood products.  Total pump time was 111 minutes.     Sheliah Plane, MD     EG/MEDQ  D:  08/26/2012   T:  08/27/2012  Job:  [id number]  cc:   Italy Hilty, MD

## 2012-09-16 ENCOUNTER — Other Ambulatory Visit: Payer: Self-pay | Admitting: *Deleted

## 2012-09-16 ENCOUNTER — Ambulatory Visit: Payer: Medicare Other | Admitting: *Deleted

## 2012-09-16 DIAGNOSIS — I2510 Atherosclerotic heart disease of native coronary artery without angina pectoris: Secondary | ICD-10-CM

## 2012-09-18 ENCOUNTER — Encounter: Payer: Medicare Other | Admitting: Cardiothoracic Surgery

## 2012-09-23 ENCOUNTER — Ambulatory Visit (INDEPENDENT_AMBULATORY_CARE_PROVIDER_SITE_OTHER): Payer: Self-pay | Admitting: Cardiothoracic Surgery

## 2012-09-23 ENCOUNTER — Ambulatory Visit
Admission: RE | Admit: 2012-09-23 | Discharge: 2012-09-23 | Disposition: A | Discharge status: Home / Self Care | Payer: Medicare Other | Source: Ambulatory Visit | Attending: Cardiothoracic Surgery | Admitting: Cardiothoracic Surgery

## 2012-09-23 ENCOUNTER — Encounter: Payer: Self-pay | Admitting: Cardiothoracic Surgery

## 2012-09-23 VITALS — BP 156/100 | HR 93 | Resp 20 | Ht 71.0 in | Wt 174.0 lb

## 2012-09-23 DIAGNOSIS — I2510 Atherosclerotic heart disease of native coronary artery without angina pectoris: Secondary | ICD-10-CM

## 2012-09-23 DIAGNOSIS — Z951 Presence of aortocoronary bypass graft: Secondary | ICD-10-CM

## 2012-09-23 NOTE — Patient Instructions (Addendum)
Coronary Artery Bypass Grafting  Care After  Refer to this sheet in the next few weeks. These instructions provide you with information on caring for yourself after your procedure. Your caregiver may also give you more specific instructions. Your treatment has been planned according to current medical practices, but problems sometimes occur. Call your caregiver if you have any problems or questions after your procedure.  Recovery from open heart surgery will be different for everyone. Some people feel well after 3 or 4 weeks, while for others it takes longer. After heart surgery, it may be normal to:  Not have an appetite, feel nauseated by the smell of food, or only want to eat a small amount.   Be constipated because of changes in your diet, activity, and medicines. Eat foods high in fiber. Add fresh fruits and vegetables to your diet. Stool softeners may be helpful.   Feel sad or unhappy. You may be frustrated or cranky. You may have good days and bad days. Do not give up. Talk to your caregiver if you do not feel better.   Feel weakness and fatigue. You many need physical therapy or cardiac rehabilitation to get your strength back.   Develop an irregular heartbeat called atrial fibrillation. Symptoms of atrial fibrillation are a fast, irregular heartbeat or feelings of fluttery heartbeats, shortness of breath, low blood pressure, and dizziness. If these symptoms develop, see your caregiver right away.  MEDICATION  Have a list of all the medicines you will be taking when you leave the hospital. For every medicine, know the following:   Name.   Exact dose.   Time of day to be taken.   How often it should be taken.   Why you are taking it.   Ask which medicines should or should not be taken together. If you take more than one heart medicine, ask if it is okay to take them together. Some heart medicines should not be taken at the same time because they may lower your blood pressure too  much.   Narcotic pain medicine can cause constipation. Eat fresh fruits and vegetables. Add fiber to your diet. Stool softener medicine may help relieve constipation.   Keep a copy of your medicines with you at all times.   Do not add or stop taking any medicine until you check with your caregiver.   Medicines can have side effects. Call your caregiver who prescribed the medicine if you:   Start throwing up, have diarrhea, or have stomach pain.   Feel dizzy or lightheaded when you stand up.   Feel your heart is skipping beats or is beating too fast or too slow.   Develop a rash.   Notice unusual bruising or bleeding.  HOME CARE INSTRUCTIONS  After heart surgery, it is important to learn how to take your pulse. Have your caregiver show you how to take your pulse.   Use your incentive spirometer. Ask your caregiver how long after surgery you need to use it.  Care of your chest incision  Tell your caregiver right away if you notice clicking in your chest (sternum).   Support your chest with a pillow or your arms when you take deep breaths and cough.   Follow your caregiver's instructions about when you can bathe or swim.   Protect your incision from sunlight during the first year to keep the scar from getting dark.   Tell your caregiver if you notice:   Increased tenderness of your incision.  Increased redness or swelling around your incision.   Drainage or pus from your incision.  Care of your leg incision(s)  Avoid crossing your legs.   Avoid sitting for long periods of time. Change positions every half hour.   Elevate your leg(s) when you are sitting.   Check your leg(s) daily for swelling. Check the incisions for redness or drainage.   Diet is very important to heart health.   Eat plenty of fresh fruits and vegetables. Meats should be lean cut. Avoid canned, processed, and fried foods.   Talk to a dietician. They can teach you how to make healthy food and  drink choices.  Weight  Weigh yourself every day. This is important because it helps to know if you are retaining fluid that may make your heart and lungs work harder.   Use the same scale each time.   Weigh yourself every morning at the same time. You should do this after you go to the bathroom, but before you eat breakfast.   Your weight will be more accurate if you do not wear any clothes.   Record your weight.   Tell your caregiver if you have gained 2 pounds or more overnight.  Activity Stop any activity at once if you have chest pain, shortness of breath, irregular heartbeats, or dizziness. Get help right away if you have any of these symptoms.  Bathing.  Avoid soaking in a bath or hot tub until your incisions are healed.   Rest. You need a balance of rest and activity.   Exercise. Exercise per your caregiver's advice. You may need physical therapy or cardiac rehabilitation to help strengthen your muscles and build your endurance.   Climbing stairs. Unless your caregiver tells you not to climb stairs, go up stairs slowly and rest if you tire. Do not pull yourself up by the handrail.   Driving a car. Follow your caregiver's advice on when you may drive. You may ride as a passenger at any time. When traveling for long periods of time in a car, get out of the car and walk around for a few minutes every 2 hours.   Lifting. Avoid lifting, pushing, or pulling anything heavier than 10 pounds for 6 weeks after surgery or as told by your caregiver.   Returning to work. Check with your caregiver. People heal at different rates. Most people will be able to go back to work 6 to 12 weeks after surgery.   Sexual activity. You may resume sexual relations as told by your caregiver.  SEEK MEDICAL CARE IF:  Any of your incisions are red, painful, or have any type of drainage coming from them.   You have an oral temperature above 101.5 F .   You have ankle or leg swelling.   You have pain  in your legs.   You have weight gain of 2 or more pounds a day.   You feel dizzy or lightheaded when you stand up.  SEEK IMMEDIATE MEDICAL CARE IF:  You have angina or chest pain that goes to your jaw or arms. Call your local emergency services right away.   You have shortness of breath at rest or with activity.   You have a fast or irregular heartbeat (arrhythmia).   There is a "clicking" in your sternum when you move.   You have numbness or weakness in your arms or legs.  MAKE SURE YOU:  Understand these instructions.   Will watch your condition.   Will  get help right away if you are not doing well or get worse.    No lifting over 25 lbs for 3 months Go to Diabetic class Start Cardiac Rehab Take lopressor 25 mg  twice a day

## 2012-09-23 NOTE — Progress Notes (Signed)
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  POSTOPERATIVE DIAGNOSES: Three-vessel coronary occlusive disease with  left main obstruction.  PROCEDURE: Coronary artery bypass grafting x4 with left internal  mammary to the left anterior descending coronary artery, reverse  saphenous vein graft to the diagonal coronary artery, reverse saphenous  vein graft to the intermediate coronary artery, reverse saphenous vein  graft to the posterior descending coronary artery with left thigh and  calf endovein harvesting.   History of Present Illness:     Doing well post op, some left chest soreness. No chf symptoms or angina. Still smoking but has cut to 3 per day down from 2 packs per day. No leg pain with ambulation.    History  Smoking status  . Current Every Day Smoker -- 1.50 packs/day for 35 years  . Types: Cigarettes  Smokeless tobacco  . Not on file    Comment: Smokes one and a half packs of ciggarettes daily       No Known Allergies  Current Outpatient Prescriptions  Medication Sig Dispense Refill  . albuterol-ipratropium (COMBIVENT) 18-103 MCG/ACT inhaler Inhale 2 puffs into the lungs every 4 (four) hours.  1 Inhaler  1  . aspirin EC 325 MG EC tablet Take 1 tablet (325 mg total) by mouth daily.  30 tablet    . CYMBALTA 60 MG capsule Take 60 mg by mouth daily.       Marland Kitchen dexlansoprazole (DEXILANT) 60 MG capsule Take 1 capsule (60 mg total) by mouth daily.  30 capsule  11  . folic acid (FOLVITE) 1 MG tablet Take 1 tablet (1 mg total) by mouth daily. For one month then stop.  30 tablet  0  . guaiFENesin (MUCINEX) 600 MG 12 hr tablet Take 1  tablet (600 mg total) by mouth every 12 (twelve) hours as needed for congestion.      . insulin aspart (NOVOLOG FLEXPEN) 100 UNIT/ML injection Inject 10-40 Units into the skin daily. Take 10 units in the morning, and take 40 units at bedtime      . lisinopril (PRINIVIL,ZESTRIL) 10 MG tablet Take 1 tablet (10 mg total) by mouth daily.  30 tablet  1  . metFORMIN (GLUCOPHAGE) 1000 MG tablet Take 1,000 mg by mouth 2 (two) times daily.      . metoprolol tartrate (LOPRESSOR) 25 MG tablet Take 1 tablet (25 mg total) by mouth 2 (two) times daily.  60 tablet  1  . oxyCODONE-acetaminophen (PERCOCET/ROXICET) 5-325 MG per tablet Take 1 tablet by mouth every 4 (four) hours as needed for pain. For pain  40 tablet  0  . simvastatin (ZOCOR) 40 MG tablet Take 40 mg by mouth daily after breakfast.       . Tamsulosin HCl (FLOMAX) 0.4 MG CAPS Take 0.4 mg by mouth daily.        No current facility-administered medications for this visit.       Physical Exam: BP 156/100  Pulse 93  Resp 20  Ht  5\' 11"  (1.803 m)  Wt 174 lb (78.926 kg)  BMI 24.28 kg/m2  SpO2 95%  General appearance: alert and cooperative Neurologic: intact Heart: regular rate and rhythm, S1, S2 normal, no murmur, click, rub or gallop and normal apical impulse Lungs: clear to auscultation bilaterally and normal percussion bilaterally Abdomen: soft, non-tender; bowel sounds normal; no masses,  no organomegaly Extremities: extremities normal, atraumatic, no cyanosis or edema and Homans sign is negative, no sign of DVT Wound: sternum stable   Diagnostic Studies & Laboratory data:         Recent Radiology Findings: Dg Chest 2 View  09/23/2012  *RADIOLOGY REPORT*  Clinical Data: Follow up CABG January 2014  CHEST - 2 VIEW  Comparison: Chest x-ray of 08/25/2012  Findings: The small pleural effusions have resolved, and aeration has improved with resolution of basilar atelectasis.  Mild cardiomegaly is stable.  No pneumothorax is seen.  Median  sternotomy sutures appear intact.  There are degenerative changes diffusely throughout the thoracic spine.  IMPRESSION: Resolution of basilar atelectasis and effusions.  No active lung disease.   Original Report Authenticated By: Dwyane Dee, M.D.       Recent Labs: Lab Results  Component Value Date   WBC 11.7* 08/25/2012   HGB 11.7* 08/25/2012   HCT 35.0* 08/25/2012   PLT 117* 08/25/2012   GLUCOSE 183* 08/26/2012   ALT 33 08/20/2012   AST 22 08/20/2012   NA 134* 08/26/2012   K 3.9 08/26/2012   CL 92* 08/26/2012   CREATININE 0.59 08/26/2012   BUN 12 08/26/2012   CO2 28 08/26/2012   TSH 1.762 01/17/2012   INR 1.28 08/22/2012   HGBA1C 10.7* 08/20/2012   Lower Extremity  Right  Left   Dorsalis Pedis  74 Dampened Monophasic  94 Monophasic       Posterior Tibial  67 Dampened Monophasic  104 Dampened Monophasic   Ankle/Brachial Indices  0.50  0.70   Findings: ABIs on the right indicate a moderate to severe reduction in arterial flow. Left ABIs indicate a moderate reduction in arterial flow.    Assessment / Plan:    To see Dr Allyson Sabal about leg circulation problems, ABIs on the right indicate a moderate to severe reduction in arterial flow Hypertensive, will resume lopressor 25 mg  BID D/c home o2 Start  Diabetic education class Start Cardiac Rehab       Sidnie Swalley B 09/23/2012 3:32 PM

## 2012-09-26 ENCOUNTER — Telehealth (HOSPITAL_COMMUNITY): Payer: Self-pay | Admitting: Dietician

## 2012-09-26 NOTE — Telephone Encounter (Addendum)
Received call from [facility] to set up appointment for diabetes education. Appointment scheduled for 10/02/12 at 11 AM.  Noted pt was referred to [facility] s/p discharge from hospitalization at [facility] on 08/22/12.

## 2012-10-02 ENCOUNTER — Telehealth (HOSPITAL_COMMUNITY): Payer: Self-pay | Admitting: Dietician

## 2012-10-02 NOTE — Telephone Encounter (Signed)
Pt was a no-show for appointment scheduled for 10/02/2012 at 11:00 AM. Sent letter to pt home notifying pt of no-show and requesting rescheduling appointment.

## 2012-10-06 ENCOUNTER — Telehealth: Payer: Self-pay | Admitting: Gastroenterology

## 2012-10-06 ENCOUNTER — Ambulatory Visit: Payer: Medicare Other | Admitting: Gastroenterology

## 2012-10-06 NOTE — Telephone Encounter (Signed)
Pt was a no show

## 2012-10-23 ENCOUNTER — Emergency Department (HOSPITAL_COMMUNITY): Payer: PRIVATE HEALTH INSURANCE

## 2012-10-23 ENCOUNTER — Emergency Department (HOSPITAL_COMMUNITY)
Admission: EM | Admit: 2012-10-23 | Discharge: 2012-10-23 | Disposition: A | Discharge status: Home / Self Care | Payer: PRIVATE HEALTH INSURANCE | Attending: Emergency Medicine | Admitting: Emergency Medicine

## 2012-10-23 ENCOUNTER — Encounter (HOSPITAL_COMMUNITY): Payer: Self-pay | Admitting: Emergency Medicine

## 2012-10-23 DIAGNOSIS — K228 Other specified diseases of esophagus: Secondary | ICD-10-CM | POA: Insufficient documentation

## 2012-10-23 DIAGNOSIS — R0602 Shortness of breath: Secondary | ICD-10-CM | POA: Insufficient documentation

## 2012-10-23 DIAGNOSIS — R0789 Other chest pain: Secondary | ICD-10-CM | POA: Insufficient documentation

## 2012-10-23 DIAGNOSIS — Z794 Long term (current) use of insulin: Secondary | ICD-10-CM | POA: Insufficient documentation

## 2012-10-23 DIAGNOSIS — Z951 Presence of aortocoronary bypass graft: Secondary | ICD-10-CM | POA: Insufficient documentation

## 2012-10-23 DIAGNOSIS — K219 Gastro-esophageal reflux disease without esophagitis: Secondary | ICD-10-CM | POA: Insufficient documentation

## 2012-10-23 DIAGNOSIS — E119 Type 2 diabetes mellitus without complications: Secondary | ICD-10-CM | POA: Insufficient documentation

## 2012-10-23 DIAGNOSIS — K2289 Other specified disease of esophagus: Secondary | ICD-10-CM | POA: Insufficient documentation

## 2012-10-23 DIAGNOSIS — F17200 Nicotine dependence, unspecified, uncomplicated: Secondary | ICD-10-CM | POA: Insufficient documentation

## 2012-10-23 DIAGNOSIS — Z7982 Long term (current) use of aspirin: Secondary | ICD-10-CM | POA: Insufficient documentation

## 2012-10-23 DIAGNOSIS — E7800 Pure hypercholesterolemia, unspecified: Secondary | ICD-10-CM | POA: Insufficient documentation

## 2012-10-23 DIAGNOSIS — Z79899 Other long term (current) drug therapy: Secondary | ICD-10-CM | POA: Insufficient documentation

## 2012-10-23 DIAGNOSIS — Z8719 Personal history of other diseases of the digestive system: Secondary | ICD-10-CM | POA: Insufficient documentation

## 2012-10-23 DIAGNOSIS — Z9861 Coronary angioplasty status: Secondary | ICD-10-CM | POA: Insufficient documentation

## 2012-10-23 DIAGNOSIS — E876 Hypokalemia: Secondary | ICD-10-CM | POA: Insufficient documentation

## 2012-10-23 DIAGNOSIS — R112 Nausea with vomiting, unspecified: Secondary | ICD-10-CM | POA: Insufficient documentation

## 2012-10-23 DIAGNOSIS — R1013 Epigastric pain: Secondary | ICD-10-CM | POA: Insufficient documentation

## 2012-10-23 DIAGNOSIS — J4489 Other specified chronic obstructive pulmonary disease: Secondary | ICD-10-CM | POA: Insufficient documentation

## 2012-10-23 DIAGNOSIS — Z8739 Personal history of other diseases of the musculoskeletal system and connective tissue: Secondary | ICD-10-CM | POA: Insufficient documentation

## 2012-10-23 DIAGNOSIS — J449 Chronic obstructive pulmonary disease, unspecified: Secondary | ICD-10-CM | POA: Insufficient documentation

## 2012-10-23 LAB — CBC WITH DIFFERENTIAL/PLATELET
Eosinophils Relative: 2 % (ref 0–5)
HCT: 45.7 % (ref 39.0–52.0)
Lymphocytes Relative: 24 % (ref 12–46)
Lymphs Abs: 2.3 10*3/uL (ref 0.7–4.0)
MCH: 31 pg (ref 26.0–34.0)
MCV: 87.5 fL (ref 78.0–100.0)
Monocytes Absolute: 0.7 10*3/uL (ref 0.1–1.0)
RBC: 5.22 MIL/uL (ref 4.22–5.81)
WBC: 9.4 10*3/uL (ref 4.0–10.5)

## 2012-10-23 LAB — BASIC METABOLIC PANEL
BUN: 10 mg/dL (ref 6–23)
CO2: 26 mEq/L (ref 19–32)
Calcium: 9.7 mg/dL (ref 8.4–10.5)
Creatinine, Ser: 0.68 mg/dL (ref 0.50–1.35)
Glucose, Bld: 252 mg/dL — ABNORMAL HIGH (ref 70–99)

## 2012-10-23 MED ORDER — ALBUTEROL SULFATE (5 MG/ML) 0.5% IN NEBU
2.5000 mg | INHALATION_SOLUTION | Freq: Once | RESPIRATORY_TRACT | Status: AC
  Administered 2012-10-23: 2.5 mg via RESPIRATORY_TRACT
  Filled 2012-10-23: qty 0.5

## 2012-10-23 MED ORDER — POTASSIUM CHLORIDE CRYS ER 20 MEQ PO TBCR
60.0000 meq | EXTENDED_RELEASE_TABLET | Freq: Once | ORAL | Status: AC
  Administered 2012-10-23: 60 meq via ORAL
  Filled 2012-10-23: qty 3

## 2012-10-23 MED ORDER — MORPHINE SULFATE 4 MG/ML IJ SOLN
2.0000 mg | Freq: Once | INTRAMUSCULAR | Status: AC
  Administered 2012-10-23: 2 mg via INTRAVENOUS
  Filled 2012-10-23: qty 1

## 2012-10-23 MED ORDER — ONDANSETRON HCL 4 MG/2ML IJ SOLN
4.0000 mg | Freq: Once | INTRAMUSCULAR | Status: AC
  Administered 2012-10-23: 4 mg via INTRAVENOUS
  Filled 2012-10-23: qty 2

## 2012-10-23 NOTE — ED Provider Notes (Addendum)
History     CSN: [id number]  Arrival date & time 10/23/12  0154   First MD Initiated Contact with Patient 10/23/12 0207      Chief Complaint  Patient presents with  . Chest Pain  . Shortness of Breath    (Consider location/radiation/quality/duration/timing/severity/associated sxs/prior treatment) HPI [name] is a 52 y.o. male with a h/o DM, HTN, GERD, CABG, who presents to the Emergency Department complaining of nausea, blurred vision, vomiting that occurred while playing computer games. He felt "different" and got up from playing and was nauseated and vomited. He sat on the couch and felt hot.  The vomiting resulted in pain in the epigastric area that radiates to his chest.He denies  cough, diarrhea. He is currently nauseated and has left sided chest pain, epigastric pain, and is thirsty.  PCP Dr. Juanetta Gosling Past Medical History  Diagnosis Date  . Esophageal erosions 05/24/01    egd by Dr. Jena Gauss  . Diabetes mellitus     type 2  . Hypercholesteremia   . Back pain   . COPD (chronic obstructive pulmonary disease)   . Hyperlipidemia   . GERD (gastroesophageal reflux disease)   . H/O hiatal hernia   . Arthritis   . Tubular adenoma of colon 08/28/11  . Unintentional weight loss   . Tobacco abuse     Past Surgical History  Procedure Laterality Date  . Esophagogastroduodenoscopy  05/24/01    by Dr. Jena Gauss  . Back surgery      x3  . Shoulder surgery      right  . Pelvic abcess drainage    . Anterior cervical decomp/discectomy fusion  12/19/2011    Procedure: ANTERIOR CERVICAL DECOMPRESSION/DISCECTOMY FUSION 2 LEVELS;  Surgeon: Karn Cassis, MD;  Location: [facility];  Service: Neurosurgery;  Laterality: N/A;  Cervical four-five Cervical five-six  Anterior cervical decompression/diskectomy, fusion, plate  . Esophagogastroduodenoscopy  08/28/11    Rourk-erosive reflux esophagitis, gastric erosions  . Colonoscopy w/ polypectomy  08/28/11    Rourk-tubular adenomas removed  from ascending colon, suboptimal prep, diminutive rectal polyps  . Cardiac catheterization  08/06/12/14  . Coronary artery bypass graft  08/22/2012    Procedure: CORONARY ARTERY BYPASS GRAFTING (CABG);  Surgeon: Delight Ovens, MD;  Location: [facility] OR;  Service: Open Heart Surgery;  Laterality: N/A;  CABG x four,  using left internal mammary artery and right leg greater saphenous vein harvested endoscopically  . Intraoperative transesophageal echocardiogram  08/22/2012    Procedure: INTRAOPERATIVE TRANSESOPHAGEAL ECHOCARDIOGRAM;  Surgeon: Delight Ovens, MD;  Location: [facility] OR;  Service: Open Heart Surgery;  Laterality: N/A;    Family History  Problem Relation Age of Onset  . Anesthesia problems Neg Hx   . Colon cancer Neg Hx     History  Substance Use Topics  . Smoking status: Current Every Day Smoker -- 1.00 packs/day for 35 years    Types: Cigarettes  . Smokeless tobacco: Not on file     Comment: Smokes one and a half packs of ciggarettes daily  . Alcohol Use: 0.0 oz/week     Comment: occasional, 12 pk beer per wk       Review of Systems  Constitutional: Negative for fever.       10 Systems reviewed and are negative for acute change except as noted in the HPI.  HENT: Negative for congestion.   Eyes: Negative for discharge and redness.  Respiratory: Negative for cough and shortness of breath.   Cardiovascular: Positive  for chest pain.  Gastrointestinal: Positive for nausea. Negative for vomiting and abdominal pain.  Musculoskeletal: Negative for back pain.  Skin: Negative for rash.  Neurological: Negative for syncope, numbness and headaches.  Psychiatric/Behavioral:       No behavior change.    Allergies  Review of patient's allergies indicates no known allergies.  Home Medications   Current Outpatient Rx  Name  Route  Sig  Dispense  Refill  . albuterol-ipratropium (COMBIVENT) 18-103 MCG/ACT inhaler   Inhalation   Inhale 2 puffs into the lungs every 4 (four) hours.    1 Inhaler   1   . aspirin EC 325 MG EC tablet   Oral   Take 1 tablet (325 mg total) by mouth daily.   30 tablet      . CYMBALTA 60 MG capsule   Oral   Take 60 mg by mouth daily.          Marland Kitchen dexlansoprazole (DEXILANT) 60 MG capsule   Oral   Take 1 capsule (60 mg total) by mouth daily.   30 capsule   11   . folic acid (FOLVITE) 1 MG tablet   Oral   Take 1 tablet (1 mg total) by mouth daily. For one month then stop.   30 tablet   0   . guaiFENesin (MUCINEX) 600 MG 12 hr tablet   Oral   Take 1 tablet (600 mg total) by mouth every 12 (twelve) hours as needed for congestion.         . insulin aspart (NOVOLOG FLEXPEN) 100 UNIT/ML injection   Subcutaneous   Inject 10-40 Units into the skin daily. Take 10 units in the morning, and take 40 units at bedtime         . lisinopril (PRINIVIL,ZESTRIL) 10 MG tablet   Oral   Take 1 tablet (10 mg total) by mouth daily.   30 tablet   1   . metFORMIN (GLUCOPHAGE) 1000 MG tablet   Oral   Take 1,000 mg by mouth 2 (two) times daily.         . metoprolol tartrate (LOPRESSOR) 25 MG tablet   Oral   Take 1 tablet (25 mg total) by mouth 2 (two) times daily.   60 tablet   1   . oxyCODONE-acetaminophen (PERCOCET/ROXICET) 5-325 MG per tablet   Oral   Take 1 tablet by mouth every 4 (four) hours as needed for pain. For pain   40 tablet   0   . simvastatin (ZOCOR) 40 MG tablet   Oral   Take 40 mg by mouth daily after breakfast.          . Tamsulosin HCl (FLOMAX) 0.4 MG CAPS   Oral   Take 0.4 mg by mouth daily.            BP 142/82  Pulse 77  Temp(Src) 97.4 F (36.3 C) (Oral)  Resp 18  Ht 5\' 11"  (1.803 m)  Wt 171 lb (77.565 kg)  BMI 23.86 kg/m2  SpO2 95%  Physical Exam  Nursing note and vitals reviewed. Constitutional: He appears well-developed and well-nourished.  Awake, alert, nontoxic appearance.  HENT:  Head: Normocephalic and atraumatic.  Right Ear: External ear normal.  Left Ear: External ear normal.   Mouth/Throat: Oropharynx is clear and moist.  Eyes: EOM are normal. Pupils are equal, round, and reactive to light. Right eye exhibits no discharge. Left eye exhibits no discharge.  Neck: Neck supple.  Cardiovascular: Normal rate and intact  distal pulses.   Pulmonary/Chest: Effort normal and breath sounds normal. He exhibits no tenderness.  Abdominal: Soft. Bowel sounds are normal. There is no tenderness. There is no rebound.  Musculoskeletal: He exhibits no tenderness.  Baseline ROM, no obvious new focal weakness.  Neurological:  Mental status and motor strength appears baseline for patient and situation.  Skin: No rash noted.  Psychiatric: He has a normal mood and affect.    ED Course  Procedures (including critical care time) Results for orders placed during the hospital encounter of 10/23/12  CBC WITH DIFFERENTIAL      Result Value Range   WBC 9.4  4.0 - 10.5 K/uL   RBC 5.22  4.22 - 5.81 MIL/uL   Hemoglobin 16.2  13.0 - 17.0 g/dL   HCT 62.1  30.8 - 65.7 %   MCV 87.5  78.0 - 100.0 fL   MCH 31.0  26.0 - 34.0 pg   MCHC 35.4  30.0 - 36.0 g/dL   RDW 84.6  96.2 - 95.2 %   Platelets 232  150 - 400 K/uL   Neutrophils Relative 65  43 - 77 %   Neutro Abs 6.1  1.7 - 7.7 K/uL   Lymphocytes Relative 24  12 - 46 %   Lymphs Abs 2.3  0.7 - 4.0 K/uL   Monocytes Relative 8  3 - 12 %   Monocytes Absolute 0.7  0.1 - 1.0 K/uL   Eosinophils Relative 2  0 - 5 %   Eosinophils Absolute 0.2  0.0 - 0.7 K/uL   Basophils Relative 1  0 - 1 %   Basophils Absolute 0.1  0.0 - 0.1 K/uL  BASIC METABOLIC PANEL      Result Value Range   Sodium 129 (*) 135 - 145 mEq/L   Potassium 3.3 (*) 3.5 - 5.1 mEq/L   Chloride 87 (*) 96 - 112 mEq/L   CO2 26  19 - 32 mEq/L   Glucose, Bld 252 (*) 70 - 99 mg/dL   BUN 10  6 - 23 mg/dL   Creatinine, Ser 8.41  0.50 - 1.35 mg/dL   Calcium 9.7  8.4 - 32.4 mg/dL   GFR calc non Af Amer >90  >90 mL/min   GFR calc Af Amer >90  >90 mL/min  TROPONIN I      Result Value  Range   Troponin I <0.30  <0.30 ng/mL    Dg Chest Portable 1 View  10/23/2012  *RADIOLOGY REPORT*  Clinical Data: Chest pain and shortness of breath tonight.  PORTABLE CHEST - 1 VIEW  Comparison: 05/23/2013  Findings: Stable postoperative changes in the mediastinum. The heart size and pulmonary vascularity are normal. The lungs appear clear and expanded without focal air space disease or consolidation. No blunting of the costophrenic angles.  No pneumothorax.  Mediastinal contours appear intact.  No significant change since previous study.  IMPRESSION: No evidence of active pulmonary disease.   Original Report Authenticated By: Burman Nieves, M.D.     Date: 10/23/2012    0158  Rate: 75  Rhythm: normal sinus rhythm  QRS Axis: normal  Intervals: normal  ST/T Wave abnormalities: normal  Conduction Disutrbances:none  Narrative Interpretation: possible anterior infarct, age undetermined  Old EKG Reviewed: unchanged c/w 08/23/2012   MDM  Patient with nausea, vomiting and epigastric pain with a normal troponin, EKG and chest xray. Given morphine and zofran. Able to take PO fluids. Reviewed results with patient. Labs with slightly low sodium and potassium. Given  potassium while here. IVF while here. Pt stable in ED with no significant deterioration in condition.The patient appears reasonably screened and/or stabilized for discharge and I doubt any other medical condition or other [facility] requiring further screening, evaluation, or treatment in the ED at this time prior to discharge.  MDM Reviewed: nursing note and vitals Interpretation: labs, ECG and x-ray           Nicoletta Dress. Colon Branch, MD 10/23/12 [id number]  Nicoletta Dress. Colon Branch, MD 11/05/12 2355

## 2012-10-23 NOTE — ED Notes (Signed)
Pt c/o left sided chest pain with n/v, dizziness, blurred vision and has a passing out feeling that started about 2 hours ago.

## 2012-11-04 ENCOUNTER — Other Ambulatory Visit: Payer: Self-pay | Admitting: Cardiovascular Disease

## 2012-11-04 ENCOUNTER — Other Ambulatory Visit (HOSPITAL_COMMUNITY): Payer: Self-pay | Admitting: Cardiovascular Disease

## 2012-11-04 DIAGNOSIS — I70219 Atherosclerosis of native arteries of extremities with intermittent claudication, unspecified extremity: Secondary | ICD-10-CM

## 2012-11-05 ENCOUNTER — Encounter (HOSPITAL_COMMUNITY): Payer: Self-pay | Admitting: Pharmacy Technician

## 2012-11-11 ENCOUNTER — Ambulatory Visit (HOSPITAL_COMMUNITY)
Admission: RE | Admit: 2012-11-11 | Discharge: 2012-11-11 | Disposition: A | Discharge status: Home / Self Care | Payer: PRIVATE HEALTH INSURANCE | Source: Ambulatory Visit | Attending: Cardiovascular Disease | Admitting: Cardiovascular Disease

## 2012-11-11 DIAGNOSIS — I70219 Atherosclerosis of native arteries of extremities with intermittent claudication, unspecified extremity: Secondary | ICD-10-CM | POA: Insufficient documentation

## 2012-11-11 NOTE — Progress Notes (Signed)
Arterial Duplex Lower Ext. Completed. [name]  

## 2012-11-17 ENCOUNTER — Encounter (HOSPITAL_COMMUNITY)
Admission: RE | Disposition: A | Discharge status: Home / Self Care | Payer: Self-pay | Source: Ambulatory Visit | Attending: Cardiovascular Disease

## 2012-11-17 ENCOUNTER — Ambulatory Visit (HOSPITAL_COMMUNITY)
Admission: RE | Admit: 2012-11-17 | Discharge: 2012-11-17 | Disposition: A | Discharge status: Home / Self Care | Payer: PRIVATE HEALTH INSURANCE | Source: Ambulatory Visit | Attending: Cardiovascular Disease | Admitting: Cardiovascular Disease

## 2012-11-17 DIAGNOSIS — E7800 Pure hypercholesterolemia, unspecified: Secondary | ICD-10-CM | POA: Insufficient documentation

## 2012-11-17 DIAGNOSIS — I70219 Atherosclerosis of native arteries of extremities with intermittent claudication, unspecified extremity: Secondary | ICD-10-CM | POA: Insufficient documentation

## 2012-11-17 DIAGNOSIS — E119 Type 2 diabetes mellitus without complications: Secondary | ICD-10-CM | POA: Insufficient documentation

## 2012-11-17 DIAGNOSIS — I2510 Atherosclerotic heart disease of native coronary artery without angina pectoris: Secondary | ICD-10-CM | POA: Insufficient documentation

## 2012-11-17 DIAGNOSIS — F17200 Nicotine dependence, unspecified, uncomplicated: Secondary | ICD-10-CM | POA: Insufficient documentation

## 2012-11-17 DIAGNOSIS — I7092 Chronic total occlusion of artery of the extremities: Secondary | ICD-10-CM | POA: Insufficient documentation

## 2012-11-17 HISTORY — PX: LOWER EXTREMITY ANGIOGRAM: SHX5508

## 2012-11-17 LAB — GLUCOSE, CAPILLARY
Glucose-Capillary: 254 mg/dL — ABNORMAL HIGH (ref 70–99)
Glucose-Capillary: 197 mg/dL — ABNORMAL HIGH (ref 70–99)

## 2012-11-17 SURGERY — ANGIOGRAM, LOWER EXTREMITY
Anesthesia: LOCAL

## 2012-11-17 MED ORDER — SODIUM CHLORIDE 0.9 % IV SOLN: 250.0000 mL | INTRAVENOUS | Status: DC | PRN

## 2012-11-17 MED ORDER — MORPHINE SULFATE 2 MG/ML IJ SOLN: 2.0000 mg | INTRAMUSCULAR | Status: DC | PRN

## 2012-11-17 MED ORDER — SODIUM CHLORIDE 0.9 % IJ SOLN: 3.0000 mL | Freq: Two times a day (BID) | INTRAMUSCULAR | Status: DC

## 2012-11-17 MED ORDER — ACETAMINOPHEN 325 MG PO TABS: 650.0000 mg | ORAL_TABLET | ORAL | Status: DC | PRN

## 2012-11-17 MED ORDER — SODIUM CHLORIDE 0.9 % IJ SOLN: 3.0000 mL | INTRAMUSCULAR | Status: DC | PRN

## 2012-11-17 MED ORDER — LIDOCAINE HCL (PF) 1 % IJ SOLN
INTRAMUSCULAR | Status: AC
  Filled 2012-11-17: qty 30

## 2012-11-17 MED ORDER — ASPIRIN EC 325 MG PO TBEC
325.0000 mg | DELAYED_RELEASE_TABLET | Freq: Every day | ORAL | Status: DC
Start: 2012-11-17 — End: 2012-11-17

## 2012-11-17 MED ORDER — ASPIRIN 81 MG PO CHEW
324.0000 mg | CHEWABLE_TABLET | ORAL | Status: AC
  Administered 2012-11-17: 324 mg via ORAL

## 2012-11-17 MED ORDER — SODIUM CHLORIDE 0.9 % IV SOLN
INTRAVENOUS | Status: DC
  Administered 2012-11-17: 10:00:00 via INTRAVENOUS

## 2012-11-17 MED ORDER — HEPARIN (PORCINE) IN NACL 2-0.9 UNIT/ML-% IJ SOLN
INTRAMUSCULAR | Status: AC
  Filled 2012-11-17: qty 500

## 2012-11-17 MED ORDER — ONDANSETRON HCL 4 MG/2ML IJ SOLN: 4.0000 mg | Freq: Four times a day (QID) | INTRAMUSCULAR | Status: DC | PRN

## 2012-11-17 MED ORDER — ASPIRIN 81 MG PO CHEW
CHEWABLE_TABLET | ORAL | Status: AC
  Filled 2012-11-17: qty 4

## 2012-11-17 MED ORDER — FENTANYL CITRATE 0.05 MG/ML IJ SOLN
INTRAMUSCULAR | Status: AC
  Filled 2012-11-17: qty 2

## 2012-11-17 MED ORDER — SODIUM CHLORIDE 0.9 % IV SOLN: INTRAVENOUS | Status: AC

## 2012-11-17 MED ORDER — MIDAZOLAM HCL 2 MG/2ML IJ SOLN
INTRAMUSCULAR | Status: AC
  Filled 2012-11-17: qty 2

## 2012-11-17 MED ORDER — HYDRALAZINE HCL 20 MG/ML IJ SOLN: 10.0000 mg | INTRAMUSCULAR | Status: DC

## 2012-11-17 NOTE — H&P (Signed)
  H & P will be scanned in.  Pt was reexamined and existing H & P reviewed. No changes found.  Runell Gess, MD [facility] 11/17/2012 10:41 AM

## 2012-11-17 NOTE — CV Procedure (Signed)
[name] is a 52 y.o. male    [id number] LOCATION:  FACILITY: [facility]  PHYSICIAN: Nanetta Batty, M.D. 11/27/1960   DATE OF PROCEDURE:  11/17/2012  DATE OF DISCHARGE:  [facility] PV Angiogram    History obtained from chart review.[name] is a 52 year old mildly overweight single Caucasian male father of one referred to me by Dr. [place] Hilty for peripheral vascular evaluation because of claudication and diminished ABIs. This was factors include continued tobacco abuse of one and a half packs per day, diabetes and high cholesterol. He was catheterized by Dr.Chad Hilty  08/19/2012 demonstrating three-vessel coronary artery disease and he ultimately underwent coronary bypass grafting x4 on January 17. His EF was normal. He presents now for angiography and potential percutaneous intervention for lifestyle limiting claudication. Lower with Doppler performed in our office earlier this month revealed ABIs of 0.67 on the right, 0.76 on the left with occluded superficial femoral arteries.   PROCEDURE DESCRIPTION:    The patient was brought to the second floor [facility] cath lab in the postabsorptive state. He was premedicated with Valium 5 mg by mouth. His right groin was prepped and shaved in usual sterile fashion. Xylocaine 1% was used for local anesthesia. A 5 French sheath was inserted into the right common femoral  artery using standard Seldinger technique. A 5 French pigtail catheter was used for abdominal aortography with bifemoral runoff using digital subtraction bolus chase step table technique. Visipaque dye was used for the entirety of the case. Retrograde aortic pressure was monitored during the case.   HEMODYNAMICS:    AO SYSTOLIC/AO DIASTOLIC: 164/71    ANGIOGRAPHIC RESULTS:   1. Abdominal aortogram-renal arteries were normal, the infrarenal abdominal aorta was free of significant atherosclerotic changes.  2: Left lower  extremity-moderately long segment occlusion of the left SFA that was fluoroscopically calcified, and three-vessel runoff.  3: Right lower extremity-moderately long segment occlusion of the mid right SFA that was fluoroscopically calcified with three-vessel runoff.  IMPRESSION:[name] has bilaterally occluded SFAs over a moderately long distance with fluoroscopic ossification and good infrapopliteal runoff. He'll need PTA and stenting specimen is rotational atherectomy for debulking. The sheath was removed and pressure was held on the groin to achieve hemostasis. The patient left the Cath Lab in stable condition. He'll be hydrated and discharged home. I will follow him up in the office in one to 2 weeks to discuss the revascularization timing and strategy.  Runell Gess MD, [facility] 11/17/2012 11:04 AM

## 2012-12-04 ENCOUNTER — Other Ambulatory Visit: Payer: Self-pay | Admitting: *Deleted

## 2012-12-04 DIAGNOSIS — Z01810 Encounter for preprocedural cardiovascular examination: Secondary | ICD-10-CM

## 2012-12-08 NOTE — Addendum Note (Signed)
Addended by: Naba Sneed J on: 12/08/2012 06:36 AM   Modules accepted: Orders  

## 2012-12-19 ENCOUNTER — Encounter (HOSPITAL_COMMUNITY): Payer: Self-pay | Admitting: Pharmacy Technician

## 2012-12-26 ENCOUNTER — Other Ambulatory Visit: Payer: Self-pay | Admitting: Cardiovascular Disease

## 2012-12-26 LAB — CBC
HCT: 49.8 % (ref 39.0–52.0)
Hemoglobin: 17 g/dL (ref 13.0–17.0)
WBC: 8.3 10*3/uL (ref 4.0–10.5)

## 2012-12-26 LAB — APTT: aPTT: 32 seconds (ref 24–37)

## 2012-12-26 LAB — PROTIME-INR: INR: 0.95 (ref <1.50)

## 2012-12-27 LAB — BASIC METABOLIC PANEL
Chloride: 100 mEq/L (ref 96–112)
Creat: 0.89 mg/dL (ref 0.50–1.35)
Potassium: 4.9 mEq/L (ref 3.5–5.3)

## 2013-01-01 MED ORDER — CEFAZOLIN SODIUM-DEXTROSE 2-3 GM-% IV SOLR
2.0000 g | INTRAVENOUS | Status: DC
  Filled 2013-01-01: qty 50

## 2013-01-02 ENCOUNTER — Encounter (HOSPITAL_COMMUNITY)
Admission: RE | Disposition: A | Discharge status: Home / Self Care | Payer: Self-pay | Source: Ambulatory Visit | Attending: Cardiovascular Disease

## 2013-01-02 ENCOUNTER — Ambulatory Visit (HOSPITAL_COMMUNITY)
Admission: RE | Admit: 2013-01-02 | Discharge: 2013-01-03 | Disposition: A | Discharge status: Home / Self Care | Payer: Medicare Other | Source: Ambulatory Visit | Attending: Cardiovascular Disease | Admitting: Cardiovascular Disease

## 2013-01-02 ENCOUNTER — Other Ambulatory Visit: Payer: Self-pay | Admitting: Physician Assistant

## 2013-01-02 ENCOUNTER — Encounter (HOSPITAL_COMMUNITY): Payer: Self-pay | Admitting: General Practice

## 2013-01-02 DIAGNOSIS — Z720 Tobacco use: Secondary | ICD-10-CM | POA: Diagnosis present

## 2013-01-02 DIAGNOSIS — Z79899 Other long term (current) drug therapy: Secondary | ICD-10-CM | POA: Insufficient documentation

## 2013-01-02 DIAGNOSIS — E785 Hyperlipidemia, unspecified: Secondary | ICD-10-CM | POA: Insufficient documentation

## 2013-01-02 DIAGNOSIS — Z951 Presence of aortocoronary bypass graft: Secondary | ICD-10-CM

## 2013-01-02 DIAGNOSIS — E663 Overweight: Secondary | ICD-10-CM | POA: Insufficient documentation

## 2013-01-02 DIAGNOSIS — F17200 Nicotine dependence, unspecified, uncomplicated: Secondary | ICD-10-CM | POA: Insufficient documentation

## 2013-01-02 DIAGNOSIS — E119 Type 2 diabetes mellitus without complications: Secondary | ICD-10-CM | POA: Insufficient documentation

## 2013-01-02 DIAGNOSIS — I2510 Atherosclerotic heart disease of native coronary artery without angina pectoris: Secondary | ICD-10-CM | POA: Diagnosis present

## 2013-01-02 DIAGNOSIS — I739 Peripheral vascular disease, unspecified: Secondary | ICD-10-CM

## 2013-01-02 DIAGNOSIS — I70219 Atherosclerosis of native arteries of extremities with intermittent claudication, unspecified extremity: Secondary | ICD-10-CM | POA: Insufficient documentation

## 2013-01-02 DIAGNOSIS — Z794 Long term (current) use of insulin: Secondary | ICD-10-CM | POA: Diagnosis present

## 2013-01-02 DIAGNOSIS — Z01810 Encounter for preprocedural cardiovascular examination: Secondary | ICD-10-CM

## 2013-01-02 DIAGNOSIS — I10 Essential (primary) hypertension: Secondary | ICD-10-CM | POA: Diagnosis present

## 2013-01-02 HISTORY — DX: Type 2 diabetes mellitus without complications: E11.9

## 2013-01-02 HISTORY — PX: VASCULAR SURGERY: SHX849

## 2013-01-02 HISTORY — PX: ATHERECTOMY: SHX5502

## 2013-01-02 HISTORY — DX: Essential (primary) hypertension: I10

## 2013-01-02 LAB — POCT ACTIVATED CLOTTING TIME
Activated Clotting Time: 263 seconds
Activated Clotting Time: 225 seconds

## 2013-01-02 LAB — GLUCOSE, CAPILLARY
Glucose-Capillary: 184 mg/dL — ABNORMAL HIGH (ref 70–99)
Glucose-Capillary: 159 mg/dL — ABNORMAL HIGH (ref 70–99)
Glucose-Capillary: 279 mg/dL — ABNORMAL HIGH (ref 70–99)

## 2013-01-02 SURGERY — ATHERECTOMY
Anesthesia: LOCAL

## 2013-01-02 MED ORDER — OXYCODONE-ACETAMINOPHEN 5-325 MG PO TABS
1.0000 | ORAL_TABLET | ORAL | Status: DC | PRN
  Administered 2013-01-02 (×3): 1 via ORAL
  Filled 2013-01-02 (×3): qty 1

## 2013-01-02 MED ORDER — MIDAZOLAM HCL 2 MG/2ML IJ SOLN
INTRAMUSCULAR | Status: AC
  Filled 2013-01-02: qty 2

## 2013-01-02 MED ORDER — HEPARIN (PORCINE) IN NACL 2-0.9 UNIT/ML-% IJ SOLN
INTRAMUSCULAR | Status: AC
  Filled 2013-01-02: qty 500

## 2013-01-02 MED ORDER — DIAZEPAM 5 MG PO TABS
ORAL_TABLET | ORAL | Status: AC
  Administered 2013-01-02: 5 mg via ORAL
  Filled 2013-01-02: qty 1

## 2013-01-02 MED ORDER — FENTANYL CITRATE 0.05 MG/ML IJ SOLN
INTRAMUSCULAR | Status: AC
  Filled 2013-01-02: qty 2

## 2013-01-02 MED ORDER — ACETAMINOPHEN 325 MG PO TABS: 650.0000 mg | ORAL_TABLET | ORAL | Status: DC | PRN

## 2013-01-02 MED ORDER — MORPHINE SULFATE 2 MG/ML IJ SOLN: 2.0000 mg | INTRAMUSCULAR | Status: DC | PRN

## 2013-01-02 MED ORDER — CEFAZOLIN SODIUM-DEXTROSE 2-3 GM-% IV SOLR
INTRAVENOUS | Status: AC
  Filled 2013-01-02: qty 50

## 2013-01-02 MED ORDER — CLOPIDOGREL BISULFATE 300 MG PO TABS
ORAL_TABLET | ORAL | Status: AC
  Filled 2013-01-02: qty 1

## 2013-01-02 MED ORDER — GUAIFENESIN ER 600 MG PO TB12
600.0000 mg | ORAL_TABLET | Freq: Two times a day (BID) | ORAL | Status: DC | PRN
  Filled 2013-01-02: qty 1

## 2013-01-02 MED ORDER — ONDANSETRON HCL 4 MG/2ML IJ SOLN: 4.0000 mg | Freq: Four times a day (QID) | INTRAMUSCULAR | Status: DC | PRN

## 2013-01-02 MED ORDER — SODIUM CHLORIDE 0.9 % IV SOLN
INTRAVENOUS | Status: DC
  Administered 2013-01-02: 10:00:00 via INTRAVENOUS

## 2013-01-02 MED ORDER — METFORMIN HCL 500 MG PO TABS
500.0000 mg | ORAL_TABLET | Freq: Two times a day (BID) | ORAL | Status: DC
Start: 2013-01-02 — End: 2013-01-02
  Filled 2013-01-02 (×2): qty 1

## 2013-01-02 MED ORDER — LISINOPRIL 10 MG PO TABS
10.0000 mg | ORAL_TABLET | Freq: Every day | ORAL | Status: DC
  Administered 2013-01-02 – 2013-01-03 (×2): 10 mg via ORAL
  Filled 2013-01-02 (×2): qty 1

## 2013-01-02 MED ORDER — INSULIN ASPART 100 UNIT/ML ~~LOC~~ SOLN
0.0000 [IU] | Freq: Three times a day (TID) | SUBCUTANEOUS | Status: DC
  Administered 2013-01-03: 09:00:00 5 [IU] via SUBCUTANEOUS

## 2013-01-02 MED ORDER — METOPROLOL TARTRATE 25 MG PO TABS
25.0000 mg | ORAL_TABLET | Freq: Two times a day (BID) | ORAL | Status: DC
  Administered 2013-01-02 – 2013-01-03 (×3): 25 mg via ORAL
  Filled 2013-01-02 (×4): qty 1

## 2013-01-02 MED ORDER — DIAZEPAM 5 MG PO TABS: 5.0000 mg | ORAL_TABLET | ORAL | Status: AC

## 2013-01-02 MED ORDER — HYDRALAZINE HCL 20 MG/ML IJ SOLN
10.0000 mg | INTRAMUSCULAR | Status: DC | PRN
  Administered 2013-01-02: 15:00:00 10 mg via INTRAVENOUS
  Filled 2013-01-02: qty 1

## 2013-01-02 MED ORDER — LIDOCAINE HCL (PF) 1 % IJ SOLN
INTRAMUSCULAR | Status: AC
  Filled 2013-01-02: qty 30

## 2013-01-02 MED ORDER — SODIUM CHLORIDE 0.9 % IV SOLN: INTRAVENOUS | Status: AC

## 2013-01-02 MED ORDER — FOLIC ACID 1 MG PO TABS
1.0000 mg | ORAL_TABLET | Freq: Every day | ORAL | Status: DC
  Administered 2013-01-02 – 2013-01-03 (×2): 1 mg via ORAL
  Filled 2013-01-02 (×2): qty 1

## 2013-01-02 MED ORDER — SIMVASTATIN 40 MG PO TABS
40.0000 mg | ORAL_TABLET | Freq: Every day | ORAL | Status: DC
  Administered 2013-01-02: 22:00:00 40 mg via ORAL
  Filled 2013-01-02 (×2): qty 1

## 2013-01-02 MED ORDER — PANTOPRAZOLE SODIUM 40 MG PO TBEC
40.0000 mg | DELAYED_RELEASE_TABLET | Freq: Every day | ORAL | Status: DC
  Administered 2013-01-02 – 2013-01-03 (×2): 40 mg via ORAL
  Filled 2013-01-02 (×2): qty 1

## 2013-01-02 MED ORDER — TAMSULOSIN HCL 0.4 MG PO CAPS
0.4000 mg | ORAL_CAPSULE | Freq: Every day | ORAL | Status: DC
  Administered 2013-01-02 – 2013-01-03 (×2): 0.4 mg via ORAL
  Filled 2013-01-02 (×2): qty 1

## 2013-01-02 MED ORDER — SODIUM CHLORIDE 0.9 % IJ SOLN: 3.0000 mL | INTRAMUSCULAR | Status: DC | PRN

## 2013-01-02 MED ORDER — IPRATROPIUM-ALBUTEROL 20-100 MCG/ACT IN AERS
1.0000 | INHALATION_SPRAY | RESPIRATORY_TRACT | Status: DC
  Filled 2013-01-02: qty 4

## 2013-01-02 MED ORDER — DULOXETINE HCL 60 MG PO CPEP
60.0000 mg | ORAL_CAPSULE | Freq: Every day | ORAL | Status: DC
Start: 2013-01-02 — End: 2013-01-03
  Administered 2013-01-02 – 2013-01-03 (×2): 60 mg via ORAL
  Filled 2013-01-02 (×2): qty 1

## 2013-01-02 MED ORDER — ASPIRIN EC 325 MG PO TBEC
325.0000 mg | DELAYED_RELEASE_TABLET | Freq: Every day | ORAL | Status: DC
  Administered 2013-01-03: 09:00:00 325 mg via ORAL
  Filled 2013-01-02: qty 1

## 2013-01-02 MED ORDER — INSULIN ASPART 100 UNIT/ML ~~LOC~~ SOLN
8.0000 [IU] | Freq: Once | SUBCUTANEOUS | Status: AC
  Administered 2013-01-02: 23:00:00 8 [IU] via SUBCUTANEOUS

## 2013-01-02 MED ORDER — VERAPAMIL HCL 2.5 MG/ML IV SOLN
INTRAVENOUS | Status: AC
  Filled 2013-01-02: qty 2

## 2013-01-02 MED ORDER — IPRATROPIUM-ALBUTEROL 18-103 MCG/ACT IN AERO
2.0000 | INHALATION_SPRAY | RESPIRATORY_TRACT | Status: DC
  Filled 2013-01-02: qty 14.7

## 2013-01-02 MED ORDER — INSULIN ASPART 100 UNIT/ML ~~LOC~~ SOLN
10.0000 [IU] | Freq: Every day | SUBCUTANEOUS | Status: DC
  Administered 2013-01-02: 19:00:00 3 [IU] via SUBCUTANEOUS

## 2013-01-02 MED ORDER — NITROGLYCERIN IN D5W 200-5 MCG/ML-% IV SOLN
INTRAVENOUS | Status: AC
  Filled 2013-01-02: qty 250

## 2013-01-02 MED ORDER — ASPIRIN 81 MG PO CHEW
CHEWABLE_TABLET | ORAL | Status: AC
  Filled 2013-01-02: qty 4

## 2013-01-02 NOTE — CV Procedure (Signed)
[name] is a 52 y.o. male    [id number] LOCATION:  FACILITY: [facility]  PHYSICIAN: Nanetta Batty, M.D. 1961/04/09   DATE OF PROCEDURE:  01/02/2013  DATE OF DISCHARGE:  [facility]  PV Intervention    History obtained from chart review. Mr. Mellinger is a 52 year old mildly overweight single Caucasian male father of one child referred by Dr. [place] healthy for peripheral vascular evaluation because of claudication and diminished ABIs. This was factors include continued tobacco abuse of one pack per day, diabetes and hyperlipidemia. He was catheterized by Dr. Loleta Chance T. 08/19/2012 demonstrate three-vessel disease. He ultimately underwent coronary artery bypass grafting x4 January 17 for LIMA to his LAD, a vein to the diagonal branch, ramus intermedius branch and to the RCA. He tolerated the procedure well. His EF was 50-55% by 2-D echo. Dr. Clista Bernhardt be obtained Dopplers the office that revealed ABIs of 0.5 and 0.7. He complains of left greater than right large of a claudication at one block. I intermittently revealing high-grade segmental calcified mid right and left SFA stenoses. He presents today for percutaneous revascularization using the Viance CTO catheter, diamondback orbital rotational  atherectomy, chocolate balloon and IDEV  Stent.   PROCEDURE DESCRIPTION:    The patient was brought to the second floor [facility] cath lab in the postabsorptive state. He was premedicated with Valium 5 mg by mouth, IV Versed and fentanyl. His left groinwas prepped and shaved in usual sterile fashion. Xylocaine 1% was used for local anesthesia. A 7 French destination sheath was inserted into the left common femoral artery using standard Seldinger technique. A 5 French crossover catheter was used along with an 035 VersiCore wire to obtain contralateral access. A total of 155 cc of contrast was administered to the patient. Total heparin was 6500 units with an initial ACT of 263  falling to 225 by the end of the case.   An 014/300 cm length Sparta core wire was then loaded in the Viance CTO catheter. The catheter was then rotated quickly and the CTLs traversed with mild difficulty ultimately gaining access to the distal vessel beyond the distal cap. The Sparta core wire was advanced into the true lumen, the Viance catheter was removed, and an 014 cross catheter was used to exchange the Sparta for wire for a viper wire. A large NAV  6 distal protection device was then advanced into the above-the-knee popliteal. A 1.5 mm diamondback  bur was then used to debulk the long calcified CTO up to 120,000 rpm's. PTCA was then performed with a millimeter by 120 mm long chocolate balloon throughout the entirety of the diseased segment. There were obvious areas of linear dissection dissection. Following this an IDE V. Stent was then carefully placed and deployed throughout the entirety of the diseased segment. The filter was then retrieved and a completion antegrade was performed revealing a widely patent SFA with three-vessel runoff.  The sheath was then withdrawn back across the bifurcation and secured in place.   HEMODYNAMICS:    AO SYSTOLIC/AO DIASTOLIC: 132/64     IMPRESSION:successful Viance crossing of a long calcified mid right SFA CTO  followed by [employer] orbital rotational arthrectomy and  Debulking of  the long calcified segment with distal protection, PTA using chocolate balloon and finally stenting using IDEV stent with excellent angiographic result. The patient received 300 mg of by mouth Plavix in the lab. The patient will be hydrated overnight, discharged home in the morning on aspirin and Plavix.  He'll obtain followup Dopplers and I will see him back in the office to discuss staged intervention on his left SFA.  Runell Gess MD, [facility] 01/02/2013 1:21 PM

## 2013-01-02 NOTE — Progress Notes (Signed)
Utilization Review Completed Andi Layfield J. Rayshard Schirtzinger, RN, BSN, NCM [phone]  

## 2013-01-02 NOTE — Progress Notes (Addendum)
Site area: right groin  Site Prior to Removal:  Level 0  Pressure Applied For 20 MINUTES    Minutes Beginning at 1640  Manual:   yes  Patient Status During Pull:  stable  Post Pull Groin Site:  Level 0  Post Pull Instructions Given:  yes  Post Pull Pulses Present:  yes  Dressing Applied:  yes  Comments:   

## 2013-01-02 NOTE — H&P (Signed)
  H & P will be scanned in.  Pt was reexamined and existing H & P reviewed. No changes found.  Runell Gess, MD [facility] 01/02/2013 11:25 AM

## 2013-01-03 DIAGNOSIS — I70219 Atherosclerosis of native arteries of extremities with intermittent claudication, unspecified extremity: Secondary | ICD-10-CM

## 2013-01-03 LAB — CBC
MCHC: 34.7 g/dL (ref 30.0–36.0)
Platelets: 139 10*3/uL — ABNORMAL LOW (ref 150–400)
RDW: 12.8 % (ref 11.5–15.5)

## 2013-01-03 LAB — BASIC METABOLIC PANEL
BUN: 13 mg/dL (ref 6–23)
Calcium: 9.4 mg/dL (ref 8.4–10.5)
Creatinine, Ser: 0.82 mg/dL (ref 0.50–1.35)
GFR calc Af Amer: >90 mL/min (ref >90)
GFR calc non Af Amer: >90 mL/min (ref >90)

## 2013-01-03 MED ORDER — METFORMIN HCL 1000 MG PO TABS: 1000.0000 mg | ORAL_TABLET | Freq: Two times a day (BID) | ORAL | Status: DC

## 2013-01-03 MED ORDER — CLOPIDOGREL BISULFATE 75 MG PO TABS
75.0000 mg | ORAL_TABLET | Freq: Every day | ORAL | Status: DC
  Administered 2013-01-03: 75 mg via ORAL
  Filled 2013-01-03: qty 1

## 2013-01-03 MED ORDER — IPRATROPIUM-ALBUTEROL 20-100 MCG/ACT IN AERS: 1.0000 | INHALATION_SPRAY | RESPIRATORY_TRACT | Status: DC | PRN

## 2013-01-03 MED ORDER — CLOPIDOGREL BISULFATE 75 MG PO TABS: 75.0000 mg | ORAL_TABLET | Freq: Every day | ORAL | Status: DC

## 2013-01-03 NOTE — Progress Notes (Signed)
Brief Nutrition Note:  RD pulled to pt for malnutrition screening tool report of 40-50 lb weight loss in the past 6 months. Weight hx does not support this.  Wt Readings from Last 10 Encounters:  01/03/13 185 lb 6.5 oz (84.1 kg)  01/03/13 185 lb 6.5 oz (84.1 kg)  11/17/12 178 lb (80.74 kg)  11/17/12 178 lb (80.74 kg)  10/23/12 171 lb (77.565 kg)  09/23/12 174 lb (78.926 kg)  08/26/12 174 lb 9.7 oz (79.2 kg)  08/26/12 174 lb 9.7 oz (79.2 kg)  08/20/12 181 lb 6.4 oz (82.283 kg)  08/19/12 171 lb (77.565 kg)  01/17/12 180 lbs 08/17/11 196 lbs  Weight loss has been gradual. Not a significant about. Pt reports a good appetite. Chart reviewed, no nutrition interventions warranted at this time. Please reconsult as needed.    Clarene Duke RD, LDN Pager [phone] After Hours pager [phone]

## 2013-01-03 NOTE — Progress Notes (Signed)
Subjective:  RLL feels warm w/o discomfort  Objective:  Temp:  [97 F (36.1 C)-97.9 F (36.6 C)] 97.6 F (36.4 C) (05/31 0430) Pulse Rate:  [53-87] 62 (05/31 0430) Resp:  [18-20] 18 (05/31 0430) BP: (120-194)/(69-113) 141/89 mmHg (05/31 0430) SpO2:  [92 %-97 %] 94 % (05/31 0430) Weight:  [172 lb (78.019 kg)-185 lb 6.5 oz (84.1 kg)] 185 lb 6.5 oz (84.1 kg) (05/31 0000) Weight change:   Intake/Output from previous day: 05/30 0701 - 05/31 0700 In: 345 [I.V.:345] Out: -   Intake/Output from this shift:    Physical Exam: General appearance: alert and no distress Neck: no adenopathy, no carotid bruit, no JVD, supple, symmetrical, trachea midline and thyroid not enlarged, symmetric, no tenderness/mass/nodules Lungs: clear to auscultation bilaterally Heart: regular rate and rhythm, S1, S2 normal, no murmur, click, rub or gallop Extremities: extremities normal, atraumatic, no cyanosis or edema and right foot warmer the left Pulses: Right Dorsalis Pedis present 2+ Right Posterior Tibial present 2+  Lab Results: Results for orders placed during the hospital encounter of 01/02/13 (from the past 48 hour(s))  GLUCOSE, CAPILLARY     Status: Abnormal   Collection Time    01/02/13 10:16 AM      Result Value Range   Glucose-Capillary 199 (*) 70 - 99 mg/dL   Comment 1 Documented in Chart     Comment 2 Notify RN    POCT ACTIVATED CLOTTING TIME     Status: None   Collection Time    01/02/13 11:48 AM      Result Value Range   Activated Clotting Time 263    POCT ACTIVATED CLOTTING TIME     Status: None   Collection Time    01/02/13 12:49 PM      Result Value Range   Activated Clotting Time 225    GLUCOSE, CAPILLARY     Status: Abnormal   Collection Time    01/02/13  1:51 PM      Result Value Range   Glucose-Capillary 184 (*) 70 - 99 mg/dL  POCT ACTIVATED CLOTTING TIME     Status: None   Collection Time    01/02/13  3:40 PM      Result Value Range   Activated Clotting Time 160     GLUCOSE, CAPILLARY     Status: Abnormal   Collection Time    01/02/13  5:17 PM      Result Value Range   Glucose-Capillary 159 (*) 70 - 99 mg/dL  GLUCOSE, CAPILLARY     Status: Abnormal   Collection Time    01/02/13  9:50 PM      Result Value Range   Glucose-Capillary 279 (*) 70 - 99 mg/dL   Comment 1 Documented in Chart     Comment 2 Notify RN    BASIC METABOLIC PANEL     Status: Abnormal   Collection Time    01/03/13  5:20 AM      Result Value Range   Sodium 136  135 - 145 mEq/L   Potassium 4.4  3.5 - 5.1 mEq/L   Chloride 99  96 - 112 mEq/L   CO2 27  19 - 32 mEq/L   Glucose, Bld 213 (*) 70 - 99 mg/dL   BUN 13  6 - 23 mg/dL   Creatinine, Ser 1.61  0.50 - 1.35 mg/dL   Calcium 9.4  8.4 - 09.6 mg/dL   GFR calc non Af Amer >90  >90 mL/min   GFR calc  Af Amer >90  >90 mL/min   Comment:            The eGFR has been calculated     using the CKD EPI equation.     This calculation has not been     validated in all clinical     situations.     eGFR's persistently     <90 mL/min signify     possible Chronic Kidney Disease.  CBC     Status: Abnormal   Collection Time    01/03/13  5:20 AM      Result Value Range   WBC 8.4  4.0 - 10.5 K/uL   RBC 5.01  4.22 - 5.81 MIL/uL   Hemoglobin 15.3  13.0 - 17.0 g/dL   HCT 11.9  14.7 - 82.9 %   MCV 88.0  78.0 - 100.0 fL   MCH 30.5  26.0 - 34.0 pg   MCHC 34.7  30.0 - 36.0 g/dL   RDW 56.2  13.0 - 86.5 %   Platelets 139 (*) 150 - 400 K/uL    Imaging: Imaging results have been reviewed  Assessment/Plan:   1. Active Problems: 2.   * No active hospital problems. * 3.   Time Spent Directly with Patient:  20 minutes  Length of Stay:  LOS: 1 day   S/P RSFA Diamondback orbital rotational atherectomy, PTA (Chocholate Balloon) , and Stenting using an IDEV stent of highly calcified moderately long segment CTO of mid RSFA for lifestyle limiting claudication. Left groin OK. 2+ right PP. Labs OK. D/C home on asa and plavix. RLLE LEA the ROV  with me 2-3 weeks. Will schedule staged LSFA intervention after that.   Runell Gess 01/03/2013, 7:22 AM

## 2013-01-04 NOTE — Discharge Summary (Signed)
Physician Discharge Summary  Patient ID: [name] MRN: [id number] DOB/AGE: 01-25-1961 52 y.o.  Admit date: 01/02/2013 Discharge date: 01/04/2013  Admission Diagnoses: PAD  Discharge Diagnoses:  Principal Problem:   PAD (peripheral artery disease) Active Problems:   Diabetes mellitus   Hypertension   Tobacco abuse   Coronary atherosclerosis of native coronary artery   S/P CABG x 4 LIMA to his LAD, a vein to the diagonal branch, ramus intermedius branch and to the RCA.   Discharged Condition: stable  Hospital Course:   [name] is a 52 year old mildly overweight single Caucasian male father of one child referred by Dr. [place] Hilty for peripheral vascular evaluation because of claudication and diminished ABIs. His risk factors include continued tobacco abuse of one pack per day, diabetes and hyperlipidemia. He was catheterized by Dr. Rennis Golden 08/19/2012 which demonstrate three-vessel disease. He ultimately underwent coronary artery bypass grafting x4 January 17 for LIMA to his LAD, a vein to the diagonal branch, ramus intermedius branch and to the RCA. He tolerated the procedure well. His EF was 50-55% by 2-D echo. LEA Dopplers in the office revealed ABIs of 0.5 and 0.7. He complains of left greater than right large of a claudication at one block. PV angiogram in April revealed high-grade segmental calcified mid right and left SFA stenoses. He presented for percutaneous revascularization using the Viance CTO catheter, diamondback orbital rotational atherectomy, chocolate balloon and IDEV Stent.  The patient underwent successful Viance crossing of a long calcified mid right SFA CTO followed by diamondback orbital rotational arthrectomy and Debulking of the long calcified segment with distal protection, PTA using chocolate balloon and finally stenting using IDEV stent.  He was started on aspirin and Plavix.  He'll followup at the office for lower extremity axial Dopplers will need staged left SFA  intervention. The patient was seen by Dr. Allyson Sabal who felt he was stable for discharge home.   Consults: None  Significant Diagnostic Studies: HEMODYNAMICS:  AO SYSTOLIC/AO DIASTOLIC: 132/64  IMPRESSION:successful Viance crossing of a long calcified mid right SFA CTO followed by [employer] orbital rotational arthrectomy and Debulking of the long calcified segment with distal protection, PTA using chocolate balloon and finally stenting using IDEV stent with excellent angiographic result. The patient received 300 mg of by mouth Plavix in the lab. The patient will be hydrated overnight, discharged home in the morning on aspirin and Plavix. He'll obtain followup Dopplers and I will see him back in the office to discuss staged intervention on his left SFA.  Shaun Gess MD, [facility]  01/02/2013  CBC    Component Value Date/Time   WBC 8.4 01/03/2013 0520   RBC 5.01 01/03/2013 0520   HGB 15.3 01/03/2013 0520   HCT 44.1 01/03/2013 0520   PLT 139* 01/03/2013 0520   MCV 88.0 01/03/2013 0520   MCH 30.5 01/03/2013 0520   MCHC 34.7 01/03/2013 0520   RDW 12.8 01/03/2013 0520   LYMPHSABS 2.3 10/23/2012 0159   MONOABS 0.7 10/23/2012 0159   EOSABS 0.2 10/23/2012 0159   BASOSABS 0.1 10/23/2012 0159    BMET    Component Value Date/Time   NA 136 01/03/2013 0520   K 4.4 01/03/2013 0520   CL 99 01/03/2013 0520   CO2 27 01/03/2013 0520   GLUCOSE 213* 01/03/2013 0520   BUN 13 01/03/2013 0520   CREATININE 0.82 01/03/2013 0520   CREATININE 0.89 12/26/2012 1336   CALCIUM 9.4 01/03/2013 0520   GFRNONAA >90 01/03/2013 0520   GFRAA >90 01/03/2013 8413  Treatments: See above  Discharge Exam: Blood pressure 128/71, pulse 69, temperature 97.9 F (36.6 C), temperature source Oral, resp. rate 18, height 5\' 11"  (1.803 m), weight 185 lb 6.5 oz (84.1 kg), SpO2 94.00%.   Disposition: 01-Home or Self Care      Discharge Orders   Future Orders Complete By Expires     Diet - low sodium heart healthy  As directed      Discharge instructions  As directed     Comments:      No lifting more than a half gallon of milk or driving for three days    Increase activity slowly  As directed         Medication List    TAKE these medications       albuterol-ipratropium 18-103 MCG/ACT inhaler  Commonly known as:  COMBIVENT  Inhale 2 puffs into the lungs every 4 (four) hours.     aspirin EC 325 MG tablet  Take 325 mg by mouth daily.     clopidogrel 75 MG tablet  Commonly known as:  PLAVIX  Take 1 tablet (75 mg total) by mouth daily with breakfast.     CYMBALTA 60 MG capsule  Generic drug:  DULoxetine  Take 60 mg by mouth daily.     dexlansoprazole 60 MG capsule  Commonly known as:  DEXILANT  Take 1 capsule (60 mg total) by mouth daily.     folic acid 1 MG tablet  Commonly known as:  FOLVITE  Take 1 tablet (1 mg total) by mouth daily. For one month then stop.     guaiFENesin 600 MG 12 hr tablet  Commonly known as:  MUCINEX  Take 1 tablet (600 mg total) by mouth every 12 (twelve) hours as needed for congestion.     lisinopril 10 MG tablet  Commonly known as:  PRINIVIL,ZESTRIL  Take 1 tablet (10 mg total) by mouth daily.     metFORMIN 1000 MG tablet  Commonly known as:  GLUCOPHAGE  Take 1 tablet (1,000 mg total) by mouth 2 (two) times daily.     metoprolol tartrate 25 MG tablet  Commonly known as:  LOPRESSOR  Take 1 tablet (25 mg total) by mouth 2 (two) times daily.     NOVOLOG FLEXPEN 100 UNIT/ML injection  Generic drug:  insulin aspart  Inject 10-40 Units into the skin daily. Take 10 units in the morning, and take 40 units at bedtime     oxyCODONE-acetaminophen 5-325 MG per tablet  Commonly known as:  PERCOCET/ROXICET  Take 1 tablet by mouth every 4 (four) hours as needed for pain. For pain     simvastatin 40 MG tablet  Commonly known as:  ZOCOR  Take 40 mg by mouth at bedtime.     tamsulosin 0.4 MG Caps  Commonly known as:  FLOMAX  Take 0.4 mg by mouth daily.       Follow-up  Information   Follow up with Shaun Gess, MD. (Our office will call you with the dates and times for your appts.)    Contact information:   [address] [place] [address] [phone]       Signed: Wilburt Finlay 01/04/2013, 11:10 AM

## 2013-01-04 NOTE — Discharge Summary (Signed)
Runell Gess, M.D., [facility] [address] [place], [place]  [address]  [phone] 01/04/2013 9:42 PM

## 2013-01-07 ENCOUNTER — Ambulatory Visit (HOSPITAL_COMMUNITY)
Admission: RE | Admit: 2013-01-07 | Discharge: 2013-01-07 | Disposition: A | Discharge status: Home / Self Care | Payer: PRIVATE HEALTH INSURANCE | Source: Ambulatory Visit | Attending: Cardiovascular Disease | Admitting: Cardiovascular Disease

## 2013-01-07 DIAGNOSIS — I70219 Atherosclerosis of native arteries of extremities with intermittent claudication, unspecified extremity: Secondary | ICD-10-CM

## 2013-01-07 DIAGNOSIS — I739 Peripheral vascular disease, unspecified: Secondary | ICD-10-CM | POA: Insufficient documentation

## 2013-01-07 NOTE — Progress Notes (Signed)
Right Lower Ext. Arterial Duplex Completed. Marilynne Halsted, RDMS, RVT

## 2013-01-14 ENCOUNTER — Ambulatory Visit (INDEPENDENT_AMBULATORY_CARE_PROVIDER_SITE_OTHER): Payer: Medicare Other | Admitting: Cardiology

## 2013-01-14 ENCOUNTER — Ambulatory Visit: Payer: PRIVATE HEALTH INSURANCE | Admitting: Cardiovascular Disease

## 2013-01-14 ENCOUNTER — Encounter: Payer: Self-pay | Admitting: Internal Medicine

## 2013-01-14 ENCOUNTER — Encounter: Payer: Self-pay | Admitting: Cardiology

## 2013-01-14 VITALS — BP 150/90 | HR 94 | Ht 71.0 in | Wt 181.9 lb

## 2013-01-14 DIAGNOSIS — I739 Peripheral vascular disease, unspecified: Secondary | ICD-10-CM

## 2013-01-14 DIAGNOSIS — F17200 Nicotine dependence, unspecified, uncomplicated: Secondary | ICD-10-CM

## 2013-01-14 DIAGNOSIS — Z951 Presence of aortocoronary bypass graft: Secondary | ICD-10-CM

## 2013-01-14 DIAGNOSIS — E119 Type 2 diabetes mellitus without complications: Secondary | ICD-10-CM

## 2013-01-14 DIAGNOSIS — I10 Essential (primary) hypertension: Secondary | ICD-10-CM

## 2013-01-14 DIAGNOSIS — Z720 Tobacco use: Secondary | ICD-10-CM

## 2013-01-14 NOTE — Progress Notes (Signed)
01/14/2013   PCP: Fredirick Maudlin, MD   Chief Complaint  Patient presents with  . Follow-up    post PTA, post doppler done last wednesday    Primary Cardiologist:Dr. Ellene Route PV  MD:  Dr. Allyson Sabal  HPI:  52 year old mildly overweight single, though lives with his girlfriend, Caucasian male father of one child seen for peripheral vascular disease because of claudication and diminished ABIs. Risk factors include continued tobacco abuse of one pack -2 pks per day, diabetes and hyperlipidemia. He was catheterized by Dr. Marinus Maw 08/19/2012 demonstrated three-vessel disease. He ultimately underwent coronary artery bypass grafting x4 January 17 for LIMA to his LAD, a vein to the diagonal branch, ramus intermedius branch and to the RCA. He tolerated the procedure well. His EF was 50-55% by 2-D echo. Dopplers  revealed ABIs of 0.5 and 0.7. He complaind of left greater than right claudication at one block.   He underwent  percutaneous revascularization using the Viance CTO catheter, diamondback orbital rotational atherectomy, chocolate balloon and IDEV Stent successfully to the mid Rt SFA.    He is back today for follow up and plan his staged intervention of his Lt SFA.     Denies chest pain or shortness of breath.  Does not wish to stop smoking at this point.  Post procedure Dopplers of Rt patent right SFA stent with moderate plaque representative velocities.  ABI 0.93 marked improvement.  Patient is on Plavix and aspirin.   No Known Allergies  Current Outpatient Prescriptions  Medication Sig Dispense Refill  . aspirin EC 325 MG tablet Take 325 mg by mouth daily.      . clopidogrel (PLAVIX) 75 MG tablet Take 1 tablet (75 mg total) by mouth daily with breakfast.  30 tablet  11  . CYMBALTA 60 MG capsule Take 60 mg by mouth daily.       Marland Kitchen dexlansoprazole (DEXILANT) 60 MG capsule Take 1 capsule (60 mg total) by mouth daily.  30 capsule  11  . folic acid (FOLVITE) 1 MG tablet Take 1 tablet (1  mg total) by mouth daily. For one month then stop.  30 tablet  0  . guaiFENesin (MUCINEX) 600 MG 12 hr tablet Take 1 tablet (600 mg total) by mouth every 12 (twelve) hours as needed for congestion.      . insulin aspart (NOVOLOG FLEXPEN) 100 UNIT/ML injection Inject 10-40 Units into the skin daily. Take 10 units in the morning, and take 40 units at bedtime      . lisinopril (PRINIVIL,ZESTRIL) 10 MG tablet Take 1 tablet (10 mg total) by mouth daily.  30 tablet  1  . metFORMIN (GLUCOPHAGE) 1000 MG tablet Take 1 tablet (1,000 mg total) by mouth 2 (two) times daily.      . metoprolol tartrate (LOPRESSOR) 25 MG tablet Take 1 tablet (25 mg total) by mouth 2 (two) times daily.  60 tablet  1  . oxyCODONE-acetaminophen (PERCOCET/ROXICET) 5-325 MG per tablet Take 1 tablet by mouth every 4 (four) hours as needed for pain. For pain  40 tablet  0  . simvastatin (ZOCOR) 40 MG tablet Take 40 mg by mouth at bedtime.       . Tamsulosin HCl (FLOMAX) 0.4 MG CAPS Take 0.4 mg by mouth daily.       Marland Kitchen zolpidem (AMBIEN) 10 MG tablet Take 10 mg by mouth at bedtime as needed for sleep.       No current facility-administered medications for this  visit.    Past Medical History  Diagnosis Date  . Esophageal erosions 05/24/01    egd by Dr. Jena Gauss  . COPD (chronic obstructive pulmonary disease)   . Hyperlipidemia   . GERD (gastroesophageal reflux disease)   . H/O hiatal hernia   . Tubular adenoma of colon 08/28/11  . Unintentional weight loss   . Hypertension   . Type II diabetes mellitus   . Arthritis     "some; in hips sometimes" (01/02/2013)  . Back pain     "w/prolonged walking or standing" (01/02/2013)  . PAD (peripheral artery disease)     LEA DOPPLER, 11/11/2012 - moderate arterial insufficiency to lower extremities at rest, BILATERAL SFA-demonstrates occlusive disease with reconstitution of flow  . CAD (coronary artery disease)     CABG X 4 08/2012    Past Surgical History  Procedure Laterality Date  .  Esophagogastroduodenoscopy  05/24/01    by Dr. Jena Gauss  . Back surgery    . Shoulder surgery Right   . Pelvic abcess drainage    . Anterior cervical decomp/discectomy fusion  12/19/2011    Procedure: ANTERIOR CERVICAL DECOMPRESSION/DISCECTOMY FUSION 2 LEVELS;  Surgeon: Karn Cassis, MD;  Location: [facility];  Service: Neurosurgery;  Laterality: N/A;  Cervical four-five Cervical five-six  Anterior cervical decompression/diskectomy, fusion, plate  . Esophagogastroduodenoscopy  08/28/11    Rourk-erosive reflux esophagitis, gastric erosions  . Colonoscopy w/ polypectomy  08/28/11    Rourk-tubular adenomas removed from ascending colon, suboptimal prep, diminutive rectal polyps  . Coronary artery bypass graft  08/22/2012    Procedure: CORONARY ARTERY BYPASS GRAFTING (CABG);  Surgeon: Delight Ovens, MD;  Location: [facility] OR;  Service: Open Heart Surgery;  Laterality: N/A;  CABG x four,  using left internal mammary artery and right leg greater saphenous vein harvested endoscopically  . Intraoperative transesophageal echocardiogram  08/22/2012    Procedure: INTRAOPERATIVE TRANSESOPHAGEAL ECHOCARDIOGRAM;  Surgeon: Delight Ovens, MD;  Location: [facility] OR;  Service: Open Heart Surgery;  Laterality: N/A;  . Vascular surgery Right 01/02/2013    diamondback orbital rotational  atherectomy, chocolate balloon and IDEV  Stent.  . Lumbar fusion  ~ 2011  . Lumbar disc surgery    . Cardiac catheterization  08/19/2012    CABG warranted; Severe ostial LCx stenosis with mid to distal vessel occlusion    ZOX:WRUEAVW:UJ colds or fevers, no weight changes Skin:no rashes or ulcers HEENT:no blurred vision, no congestion CV:see HPI PUL:see HPI GI:no diarrhea constipation or melena, no indigestion GU:no hematuria, no dysuria MS:no joint pain, + claudication- can walk a block before significant pain. Neuro:no syncope, no lightheadedness Endo: diabetes he states is stable, no thyroid disease  PHYSICAL EXAM BP 150/90   Pulse 94  Ht 5\' 11"  (1.803 m)  Wt 181 lb 14.4 oz (82.509 kg)  BMI 25.38 kg/m2 General:Pleasant affect, NAD Skin:Warm and dry, brisk capillary refill HEENT:normocephalic, sclera clear, mucus membranes moist Neck:supple, no JVD, no bruits  Heart:S1S2 RRR without murmur, gallup, rub or click Lungs:clear without rales, rhonchi, or wheezes WJX:BJYN, non tender, + BS, do not palpate liver spleen or masses Ext:no lower ext edema,  Neuro:alert and oriented, MAE, follows commands, + facial symmetry   ASSESSMENT AND PLAN PAD (peripheral artery disease), Viance crossing of mid rt SFA, with diamond back athrectomy and debulking, chocolate balloon and stent with IDEV.  Need for staged PCI to Lt SFA  Stable post procedure.  Dopplers with improved blood flow.  Pt with some pain, cold feeling  at rt ankle and some occ. Swelling.  Lt leg continues with claudication.  Will discuss with Dr. Allyson Sabal timing of Lt SFA PCI  Hypertension Elevated today for diabetic, but pt stated he has essentially white coat syndrome..   S/P CABG x 4 LIMA to his LAD, a vein to the diagonal branch, ramus intermedius branch and to the RCA. CABG in 1/14, no chest pain.  No SOB  Diabetes mellitus Stated he eats what he wants.    Tobacco abuse Continues to smoke 1.5 to 2 packs a day.  Has no desire to stop. Did explain importance of stopping.

## 2013-01-14 NOTE — Patient Instructions (Signed)
I'll discuss your second procedure with Dr. Allyson Sabal in we will call you tomorrow with the plan.  Of course we would like you to stop smoking.  We will have you either see Dr. Allyson Sabal back or see you in the hospital for the procedure.

## 2013-01-14 NOTE — Assessment & Plan Note (Signed)
CABG in 1/14, no chest pain.  No SOB

## 2013-01-14 NOTE — Assessment & Plan Note (Signed)
Stated he eats what he wants.

## 2013-01-14 NOTE — Assessment & Plan Note (Signed)
Continues to smoke 1.5 to 2 packs a day.  Has no desire to stop. Did explain importance of stopping.

## 2013-01-14 NOTE — Assessment & Plan Note (Addendum)
Stable post procedure.  Dopplers with improved blood flow.  Pt with some pain, cold feeling at rt ankle and some occ. Swelling.  Lt leg continues with claudication.  Will discuss with Dr. Allyson Sabal timing of Lt SFA PCI

## 2013-01-14 NOTE — Assessment & Plan Note (Signed)
Elevated today for diabetic, but pt stated he has essentially white coat syndrome.Marland Kitchen

## 2013-01-15 ENCOUNTER — Telehealth: Payer: Self-pay | Admitting: Cardiology

## 2013-01-15 ENCOUNTER — Encounter: Payer: Self-pay | Admitting: Cardiovascular Disease

## 2013-01-15 NOTE — Telephone Encounter (Signed)
Message forwarded to L. Ingold, NP. 

## 2013-01-15 NOTE — Telephone Encounter (Signed)
Pt needs staged procedure of Lt leg PCI.  Left message for him to call me back.  We will schedule when he calls back.

## 2013-01-15 NOTE — Telephone Encounter (Signed)
Returning Vernona Rieger call from this morning!

## 2013-01-15 NOTE — Telephone Encounter (Signed)
I reached [name] and discussed if he was ready to proceed with the Lt leg.  He is agreeable to proceed.  Also notified [name] that he would have swelling in his Rt leg off and on for 2-3 months due to previous procedure.

## 2013-01-21 ENCOUNTER — Encounter (HOSPITAL_COMMUNITY): Payer: Self-pay | Admitting: Pharmacy Technician

## 2013-01-30 ENCOUNTER — Other Ambulatory Visit: Payer: Self-pay | Admitting: *Deleted

## 2013-01-30 DIAGNOSIS — Z01818 Encounter for other preprocedural examination: Secondary | ICD-10-CM

## 2013-02-01 MED ORDER — CEFAZOLIN SODIUM-DEXTROSE 2-3 GM-% IV SOLR
2.0000 g | INTRAVENOUS | Status: AC
  Filled 2013-02-01: qty 50

## 2013-02-02 ENCOUNTER — Encounter (HOSPITAL_COMMUNITY)
Admission: RE | Disposition: A | Discharge status: Home / Self Care | Payer: Self-pay | Source: Ambulatory Visit | Attending: Cardiovascular Disease

## 2013-02-02 ENCOUNTER — Encounter (HOSPITAL_COMMUNITY): Payer: Self-pay | Admitting: *Deleted

## 2013-02-02 ENCOUNTER — Ambulatory Visit (HOSPITAL_COMMUNITY)
Admission: RE | Admit: 2013-02-02 | Discharge: 2013-02-03 | Disposition: A | Discharge status: Home / Self Care | Payer: PRIVATE HEALTH INSURANCE | Source: Ambulatory Visit | Attending: Cardiovascular Disease | Admitting: Cardiovascular Disease

## 2013-02-02 DIAGNOSIS — I7092 Chronic total occlusion of artery of the extremities: Secondary | ICD-10-CM | POA: Diagnosis not present

## 2013-02-02 DIAGNOSIS — I10 Essential (primary) hypertension: Secondary | ICD-10-CM | POA: Diagnosis present

## 2013-02-02 DIAGNOSIS — I70219 Atherosclerosis of native arteries of extremities with intermittent claudication, unspecified extremity: Secondary | ICD-10-CM | POA: Diagnosis not present

## 2013-02-02 DIAGNOSIS — I739 Peripheral vascular disease, unspecified: Secondary | ICD-10-CM

## 2013-02-02 DIAGNOSIS — Z951 Presence of aortocoronary bypass graft: Secondary | ICD-10-CM | POA: Insufficient documentation

## 2013-02-02 DIAGNOSIS — E785 Hyperlipidemia, unspecified: Secondary | ICD-10-CM | POA: Diagnosis not present

## 2013-02-02 DIAGNOSIS — E119 Type 2 diabetes mellitus without complications: Secondary | ICD-10-CM | POA: Insufficient documentation

## 2013-02-02 DIAGNOSIS — F17200 Nicotine dependence, unspecified, uncomplicated: Secondary | ICD-10-CM | POA: Insufficient documentation

## 2013-02-02 DIAGNOSIS — Z794 Long term (current) use of insulin: Secondary | ICD-10-CM | POA: Diagnosis present

## 2013-02-02 DIAGNOSIS — Z79899 Other long term (current) drug therapy: Secondary | ICD-10-CM | POA: Insufficient documentation

## 2013-02-02 DIAGNOSIS — Z720 Tobacco use: Secondary | ICD-10-CM | POA: Diagnosis present

## 2013-02-02 DIAGNOSIS — I2510 Atherosclerotic heart disease of native coronary artery without angina pectoris: Secondary | ICD-10-CM | POA: Insufficient documentation

## 2013-02-02 DIAGNOSIS — Z01818 Encounter for other preprocedural examination: Secondary | ICD-10-CM

## 2013-02-02 HISTORY — PX: LOWER EXTREMITY ANGIOGRAM: SHX5508

## 2013-02-02 LAB — GLUCOSE, CAPILLARY
Glucose-Capillary: 331 mg/dL — ABNORMAL HIGH (ref 70–99)
Glucose-Capillary: 257 mg/dL — ABNORMAL HIGH (ref 70–99)

## 2013-02-02 LAB — BASIC METABOLIC PANEL
Chloride: 97 mEq/L (ref 96–112)
GFR calc non Af Amer: >90 mL/min (ref >90)
Glucose, Bld: 252 mg/dL — ABNORMAL HIGH (ref 70–99)
Potassium: 4.1 mEq/L (ref 3.5–5.1)
Sodium: 135 mEq/L (ref 135–145)

## 2013-02-02 LAB — PROTIME-INR
INR: 0.94 (ref 0.00–1.49)
Prothrombin Time: 12.4 seconds (ref 11.6–15.2)

## 2013-02-02 LAB — CBC
Hemoglobin: 15.8 g/dL (ref 13.0–17.0)
MCH: 30.6 pg (ref 26.0–34.0)
RBC: 5.17 MIL/uL (ref 4.22–5.81)

## 2013-02-02 LAB — POCT ACTIVATED CLOTTING TIME
Activated Clotting Time: 237 seconds
Activated Clotting Time: 140 seconds

## 2013-02-02 SURGERY — ANGIOGRAM, LOWER EXTREMITY
Anesthesia: LOCAL

## 2013-02-02 MED ORDER — DIAZEPAM 5 MG PO TABS
5.0000 mg | ORAL_TABLET | ORAL | Status: AC
  Administered 2013-02-02: 5 mg via ORAL

## 2013-02-02 MED ORDER — ZOLPIDEM TARTRATE 5 MG PO TABS: 10.0000 mg | ORAL_TABLET | Freq: Every evening | ORAL | Status: DC | PRN

## 2013-02-02 MED ORDER — ONDANSETRON HCL 4 MG/2ML IJ SOLN: 4.0000 mg | Freq: Four times a day (QID) | INTRAMUSCULAR | Status: DC | PRN

## 2013-02-02 MED ORDER — PANTOPRAZOLE SODIUM 40 MG PO TBEC
40.0000 mg | DELAYED_RELEASE_TABLET | Freq: Every day | ORAL | Status: DC
  Administered 2013-02-02: 18:00:00 40 mg via ORAL
  Filled 2013-02-02: qty 1

## 2013-02-02 MED ORDER — HEPARIN (PORCINE) IN NACL 2-0.9 UNIT/ML-% IJ SOLN
INTRAMUSCULAR | Status: AC
  Filled 2013-02-02: qty 500

## 2013-02-02 MED ORDER — SODIUM CHLORIDE 0.9 % IJ SOLN: 3.0000 mL | INTRAMUSCULAR | Status: DC | PRN

## 2013-02-02 MED ORDER — ASPIRIN 81 MG PO CHEW
324.0000 mg | CHEWABLE_TABLET | ORAL | Status: AC
  Administered 2013-02-02: 324 mg via ORAL

## 2013-02-02 MED ORDER — SODIUM CHLORIDE 0.9 % IV SOLN
INTRAVENOUS | Status: AC
  Administered 2013-02-02: 12:00:00 via INTRAVENOUS

## 2013-02-02 MED ORDER — TAMSULOSIN HCL 0.4 MG PO CAPS
0.4000 mg | ORAL_CAPSULE | Freq: Every day | ORAL | Status: DC
  Administered 2013-02-02 – 2013-02-03 (×2): 0.4 mg via ORAL
  Filled 2013-02-02 (×2): qty 1

## 2013-02-02 MED ORDER — ASPIRIN EC 325 MG PO TBEC: 325.0000 mg | DELAYED_RELEASE_TABLET | Freq: Every day | ORAL | Status: DC

## 2013-02-02 MED ORDER — DIAZEPAM 5 MG PO TABS
ORAL_TABLET | ORAL | Status: AC
  Filled 2013-02-02: qty 1

## 2013-02-02 MED ORDER — CLOPIDOGREL BISULFATE 75 MG PO TABS
75.0000 mg | ORAL_TABLET | Freq: Every day | ORAL | Status: DC
  Administered 2013-02-03: 09:00:00 75 mg via ORAL
  Filled 2013-02-02: qty 1

## 2013-02-02 MED ORDER — INSULIN ASPART 100 UNIT/ML ~~LOC~~ SOLN
10.0000 [IU] | Freq: Every day | SUBCUTANEOUS | Status: DC
  Administered 2013-02-03: 10 [IU] via SUBCUTANEOUS

## 2013-02-02 MED ORDER — ACETAMINOPHEN 325 MG PO TABS: 650.0000 mg | ORAL_TABLET | ORAL | Status: DC | PRN

## 2013-02-02 MED ORDER — FENTANYL CITRATE 0.05 MG/ML IJ SOLN
INTRAMUSCULAR | Status: AC
  Filled 2013-02-02: qty 2

## 2013-02-02 MED ORDER — LISINOPRIL 10 MG PO TABS
10.0000 mg | ORAL_TABLET | Freq: Every day | ORAL | Status: DC
  Administered 2013-02-02 – 2013-02-03 (×2): 10 mg via ORAL
  Filled 2013-02-02 (×2): qty 1

## 2013-02-02 MED ORDER — CLOPIDOGREL BISULFATE 75 MG PO TABS: 75.0000 mg | ORAL_TABLET | Freq: Every day | ORAL | Status: DC

## 2013-02-02 MED ORDER — INSULIN ASPART 100 UNIT/ML ~~LOC~~ SOLN
0.0000 [IU] | Freq: Three times a day (TID) | SUBCUTANEOUS | Status: DC
  Administered 2013-02-02: 11 [IU] via SUBCUTANEOUS
  Administered 2013-02-03: 5 [IU] via SUBCUTANEOUS

## 2013-02-02 MED ORDER — MIDAZOLAM HCL 2 MG/2ML IJ SOLN
INTRAMUSCULAR | Status: AC
  Filled 2013-02-02: qty 2

## 2013-02-02 MED ORDER — ASPIRIN EC 325 MG PO TBEC
325.0000 mg | DELAYED_RELEASE_TABLET | Freq: Every day | ORAL | Status: DC
  Administered 2013-02-03: 325 mg via ORAL
  Filled 2013-02-02: qty 1

## 2013-02-02 MED ORDER — METOPROLOL TARTRATE 25 MG PO TABS
25.0000 mg | ORAL_TABLET | Freq: Two times a day (BID) | ORAL | Status: DC
  Administered 2013-02-02 – 2013-02-03 (×3): 25 mg via ORAL
  Filled 2013-02-02 (×5): qty 1

## 2013-02-02 MED ORDER — MORPHINE SULFATE 2 MG/ML IJ SOLN
1.0000 mg | INTRAMUSCULAR | Status: DC | PRN
  Administered 2013-02-02: 15:00:00 1 mg via INTRAVENOUS
  Filled 2013-02-02: qty 1

## 2013-02-02 MED ORDER — LIDOCAINE HCL (PF) 1 % IJ SOLN
INTRAMUSCULAR | Status: AC
  Filled 2013-02-02: qty 30

## 2013-02-02 MED ORDER — OXYCODONE-ACETAMINOPHEN 5-325 MG PO TABS: 1.0000 | ORAL_TABLET | ORAL | Status: DC | PRN

## 2013-02-02 MED ORDER — DULOXETINE HCL 60 MG PO CPEP
60.0000 mg | ORAL_CAPSULE | Freq: Every day | ORAL | Status: DC
  Administered 2013-02-02 – 2013-02-03 (×2): 60 mg via ORAL
  Filled 2013-02-02 (×2): qty 1

## 2013-02-02 MED ORDER — HEPARIN SODIUM (PORCINE) 1000 UNIT/ML IJ SOLN
INTRAMUSCULAR | Status: AC
  Filled 2013-02-02: qty 1

## 2013-02-02 MED ORDER — HEPARIN (PORCINE) IN NACL 2-0.9 UNIT/ML-% IJ SOLN
INTRAMUSCULAR | Status: AC
  Filled 2013-02-02: qty 1000

## 2013-02-02 MED ORDER — ASPIRIN 81 MG PO CHEW
CHEWABLE_TABLET | ORAL | Status: AC
  Filled 2013-02-02: qty 4

## 2013-02-02 MED ORDER — SODIUM CHLORIDE 0.9 % IV SOLN
INTRAVENOUS | Status: DC
  Administered 2013-02-02: 09:00:00 via INTRAVENOUS

## 2013-02-02 MED ORDER — INSULIN ASPART 100 UNIT/ML ~~LOC~~ SOLN
40.0000 [IU] | Freq: Every day | SUBCUTANEOUS | Status: DC
  Administered 2013-02-02: 40 [IU] via SUBCUTANEOUS

## 2013-02-02 MED ORDER — SIMVASTATIN 40 MG PO TABS
40.0000 mg | ORAL_TABLET | Freq: Every day | ORAL | Status: DC
  Administered 2013-02-02: 22:00:00 40 mg via ORAL
  Filled 2013-02-02 (×2): qty 1

## 2013-02-02 NOTE — CV Procedure (Signed)
[name] is a 52 y.o. male    [id number] LOCATION:  FACILITY: [facility]  PHYSICIAN: Nanetta Batty, M.D. 10-21-1960   DATE OF PROCEDURE:  02/02/2013  DATE OF DISCHARGE:   CARDIAC CATHETERIZATION     History obtained from chart review.52 year old mildly overweight single, though lives with his girlfriend, Caucasian male father of one child seen for peripheral vascular disease because of claudication and diminished ABIs. Risk factors include continued tobacco abuse of one pack -2 pks per day, diabetes and hyperlipidemia. He was catheterized by Dr. Marinus Maw 08/19/2012 demonstrated three-vessel disease. He ultimately underwent coronary artery bypass grafting x4 January 17 for LIMA to his LAD, a vein to the diagonal branch, ramus intermedius branch and to the RCA. He tolerated the procedure well. His EF was 50-55% by 2-D echo. Dopplers revealed ABIs of 0.5 and 0.7. He complaind of left greater than right claudication at one block. He underwent percutaneous revascularization using the Viance CTO catheter, diamondback orbital rotational atherectomy, chocolate balloon and IDEV Stent successfully to the mid Rt SFA. He enjoyed an excellent angiographic and clinical result with improvement Dopplers. He presents now for attempt at percutaneous revascularization of his long segment calcified left SFA CTO  for lifestyle limiting claudication.     PROCEDURE DESCRIPTION:    The patient was brought to the second floor [facility] cath lab in the postabsorptive state. He was premedicated with Valium 5 mg by mouth, IV Versed and fentanyl. His right groinwas prepped and shaved in usual sterile fashion. Xylocaine 1% was used for local anesthesia. A 5 French sheath was inserted into the right common femoral  artery using standard Seldinger technique. The patient received 8500 units  of heparin  intravenously.  A total of 146 cc of contrast was administered to the patient. Visipaque was used for the entirety of  the case. Retrograde aortic pressure was monitored during the case.    HEMODYNAMICS:    AO SYSTOLIC/AO DIASTOLIC: 150/80    ANGIOGRAPHIC RESULTS:   Contralateral access was obtained with a 5 [place] crossover catheter and 7 French 55 cm long length Ansel sheath. The ACT was measured at 242. Attempts were made to cross the long calcified left SFA CTL unsuccessfully with the Viance crossing catheter. Following this attempts were made with the angled Navicross &  035 Glidewire unsuccessfully. The procedure was then aborted.  IMPRESSION:unsuccessful attempt at crossing long segment calcified left SFA CTO for lifestyle limiting claudication. The sheath was then withdrawn across the bifurcation and exchanged for a short 7 [place] sheath. The sheath removed once the ACT falls below 170 and pressure will be held on the groin to achieve hemostasis. The patient will be gently hydrated overnight and discharged home in the morning. I will see him back in the office after that to discuss further options.  Runell Gess MD, [facility] 02/02/2013 11:38 AM

## 2013-02-02 NOTE — H&P (Signed)
    Pt was reexamined and existing H & P reviewed. No changes found.  Runell Gess, MD [facility] 02/02/2013 10:09 AM

## 2013-02-02 NOTE — Progress Notes (Signed)
Utilization Review Completed Iniko Robles J. Piedad Standiford, RN, BSN, NCM [phone]  

## 2013-02-03 DIAGNOSIS — I739 Peripheral vascular disease, unspecified: Secondary | ICD-10-CM

## 2013-02-03 DIAGNOSIS — I70219 Atherosclerosis of native arteries of extremities with intermittent claudication, unspecified extremity: Secondary | ICD-10-CM | POA: Diagnosis not present

## 2013-02-03 DIAGNOSIS — I2510 Atherosclerotic heart disease of native coronary artery without angina pectoris: Secondary | ICD-10-CM

## 2013-02-03 DIAGNOSIS — I10 Essential (primary) hypertension: Secondary | ICD-10-CM

## 2013-02-03 DIAGNOSIS — Z01818 Encounter for other preprocedural examination: Secondary | ICD-10-CM

## 2013-02-03 DIAGNOSIS — Z951 Presence of aortocoronary bypass graft: Secondary | ICD-10-CM

## 2013-02-03 LAB — BASIC METABOLIC PANEL
CO2: 27 mEq/L (ref 19–32)
Calcium: 9 mg/dL (ref 8.4–10.5)
Creatinine, Ser: 0.74 mg/dL (ref 0.50–1.35)
GFR calc Af Amer: >90 mL/min (ref >90)
Sodium: 135 mEq/L (ref 135–145)

## 2013-02-03 LAB — CBC
MCV: 87.9 fL (ref 78.0–100.0)
Platelets: 158 10*3/uL (ref 150–400)
RBC: 4.78 MIL/uL (ref 4.22–5.81)
RDW: 12.8 % (ref 11.5–15.5)
WBC: 9.1 10*3/uL (ref 4.0–10.5)

## 2013-02-03 NOTE — Discharge Summary (Signed)
Physician Discharge Summary  Patient ID: [name] MRN: [id number] DOB/AGE: January 10, 1961 52 y.o.  Admit date: 02/02/2013 Discharge date: 02/03/2013  Admission Diagnoses: PAD- lifestyle limiting claudication secondary to left SFA stenosis  Discharge Diagnoses:  Principal Problem:   PAD (peripheral artery disease), Viance crossing of mid rt SFA, with diamond back athrectomy and debulking, chocolate balloon and stent with IDEV.  Unsucessful revascularization Lt SFA  Active Problems:   Diabetes mellitus   Hypertension   Tobacco abuse   Claudication in peripheral vascular disease   Discharged Condition: stable  Hospital Course: The patient is a 44 Caucasian male who presented to [facility] on [facility]/30 to undergo an elective PV angio for evaluation of  peripheral vascular disease, because of claudication and diminished ABIs. Risk factors include continued tobacco abuse of 1-2 pks per day, diabetes and hyperlipidemia. He was catheterized by Dr. Rennis Golden on 08/19/2012 and was found to have three-vessel disease. He ultimately underwent coronary artery bypass grafting x 4 on August 22, 2012. He received a LIMA to his LAD, a vein to the diagonal branch, ramus intermedius branch and to the RCA.  His EF was 50-55% by 2-D echo. As for his PVD, office dopplers revealed ABIs of 0.5 and 0.7. He complained of left greater than right claudication. On 01/02/13, he underwent percutaneous revascularization using the Viance CTO catheter, diamondback orbital rotational atherectomy, chocolate balloon and IDEV Stent successfully to the mid Rt SFA. He he had excellent angiographic and clinical result with improvement Dopplers. On 6/30, He presented back to [facility]  for attempt at percutaneous revascularization of his long segment calcified left SFA. However, this was unsuccessful and the procedure was aborted. The patient left the cath lab in stable condition. He was kept overnight for hydration. Post cath, he had no complications. The  right femoral access site remained stable. His renal function was stable, as well as his vitals. He was last seen and examined by Dr. Allyson Sabal, who determined he was stable for discharge home. He will follow-up with Dr. Allyson Sabal on 7/10 to discuss further options.    Consults: None  Significant Diagnostic Studies:   PV Angio 02/02/13 HEMODYNAMICS:  AO SYSTOLIC/AO DIASTOLIC: 150/80  ANGIOGRAPHIC RESULTS:  Contralateral access was obtained with a 5 [place] crossover catheter and 7 French 55 cm long length Ansel sheath. The ACT was measured at 242. Attempts were made to cross the long calcified left SFA CTL unsuccessfully with the Viance crossing catheter. Following this attempts were made with the angled Navicross & 035 Glidewire unsuccessfully. The procedure was then aborted.   Treatments: See Hospital Course  Discharge Exam: Blood pressure 135/75, pulse 71, temperature 97.7 F (36.5 C), temperature source Oral, resp. rate 18, height 5\' 11"  (1.803 m), weight 181 lb 10.5 oz (82.4 kg), SpO2 93.00%.   Disposition: 01-Home or Self Care  Discharge Orders   Future Appointments Provider Department Dept Phone   02/12/2013 2:45 PM Runell Gess, MD [facility] [place] [phone]   02/17/2013 11:15 AM Chrystie Nose, MD [facility] [place] [phone]   Future Orders Complete By Expires     Diet - low sodium heart healthy  As directed     Discharge instructions  As directed     Comments:      Wait until 02/04/13 to resume Metformin    Driving Restrictions  As directed     Comments:      No driving for 3 days    Increase activity  slowly  As directed     Lifting restrictions  As directed     Comments:      No lifting more than 1/2 gallon of milk for 3 days        Medication List         aspirin EC 325 MG tablet  Take 325 mg by mouth daily.     clopidogrel 75 MG tablet  Commonly known as:  PLAVIX  Take 1 tablet (75 mg total)  by mouth daily with breakfast.     CYMBALTA 60 MG capsule  Generic drug:  DULoxetine  Take 60 mg by mouth daily.     dexlansoprazole 60 MG capsule  Commonly known as:  DEXILANT  Take 1 capsule (60 mg total) by mouth daily.     folic acid 1 MG tablet  Commonly known as:  FOLVITE  Take 1 tablet (1 mg total) by mouth daily. For one month then stop.     guaiFENesin 600 MG 12 hr tablet  Commonly known as:  MUCINEX  Take 1 tablet (600 mg total) by mouth every 12 (twelve) hours as needed for congestion.     lisinopril 10 MG tablet  Commonly known as:  PRINIVIL,ZESTRIL  Take 1 tablet (10 mg total) by mouth daily.     metFORMIN 1000 MG tablet  Commonly known as:  GLUCOPHAGE  Take 1 tablet (1,000 mg total) by mouth 2 (two) times daily.     metoprolol tartrate 25 MG tablet  Commonly known as:  LOPRESSOR  Take 1 tablet (25 mg total) by mouth 2 (two) times daily.     NOVOLOG FLEXPEN 100 UNIT/ML injection  Generic drug:  insulin aspart  Inject 10-40 Units into the skin daily. Take 10 units in the morning, and take 40 units at bedtime     oxyCODONE-acetaminophen 5-325 MG per tablet  Commonly known as:  PERCOCET/ROXICET  Take 1 tablet by mouth every 4 (four) hours as needed for pain. For pain     simvastatin 40 MG tablet  Commonly known as:  ZOCOR  Take 40 mg by mouth at bedtime.     tamsulosin 0.4 MG Caps  Commonly known as:  FLOMAX  Take 0.4 mg by mouth daily.     zolpidem 10 MG tablet  Commonly known as:  AMBIEN  Take 10 mg by mouth at bedtime as needed for sleep.           Follow-up Information   Follow up with Runell Gess, MD On 02/12/2013. (2:45 pm)    Contact information:   [address] [place] [address] [phone]      TIME SPENT ON DISCHARGE, INCLUDING PHYSICIAN TIME: > 30 MINUTES  Signed: Allayne Butcher, PA-C 02/03/2013, 9:18 AM

## 2013-02-03 NOTE — Progress Notes (Signed)
[facility]  Subjective: No groin, back or flank pain. No difficulty ambulating.   Objective: Vital signs in last 24 hours: Temp:  [97.7 F (36.5 C)-98.3 F (36.8 C)] 97.7 F (36.5 C) (07/01 0832) Pulse Rate:  [65-86] 71 (07/01 0832) Resp:  [16-20] 18 (07/01 0832) BP: (118-173)/(63-134) 135/75 mmHg (07/01 0832) SpO2:  [90 %-94 %] 93 % (07/01 0832) Weight:  [181 lb 10.5 oz (82.4 kg)] 181 lb 10.5 oz (82.4 kg) (07/01 0054) Last BM Date: 02/01/13  Intake/Output from previous day: 06/30 0701 - 07/01 0700 In: 2280 [P.O.:1680; I.V.:600] Out: 1865 [Urine:1865] Intake/Output this shift:    Medications Current Facility-Administered Medications  Medication Dose Route Frequency Provider Last Rate Last Dose  . acetaminophen (TYLENOL) tablet 650 mg  650 mg Oral Q4H PRN Runell Gess, MD      . aspirin EC tablet 325 mg  325 mg Oral Daily Runell Gess, MD   325 mg at 02/03/13 0911  . clopidogrel (PLAVIX) tablet 75 mg  75 mg Oral Q breakfast Runell Gess, MD      . DULoxetine (CYMBALTA) DR capsule 60 mg  60 mg Oral Daily Runell Gess, MD   60 mg at 02/02/13 1743  . insulin aspart (novoLOG) injection 0-15 Units  0-15 Units Subcutaneous TID WC Brittainy Simmons, PA-C   5 Units at 02/03/13 0909  . insulin aspart (novoLOG) injection 10 Units  10 Units Subcutaneous QAC breakfast Runell Gess, MD   10 Units at 02/03/13 0910  . insulin aspart (novoLOG) injection 40 Units  40 Units Subcutaneous Q supper Runell Gess, MD   40 Units at 02/02/13 2159  . lisinopril (PRINIVIL,ZESTRIL) tablet 10 mg  10 mg Oral Daily Runell Gess, MD   10 mg at 02/02/13 1742  . metoprolol tartrate (LOPRESSOR) tablet 25 mg  25 mg Oral BID Runell Gess, MD   25 mg at 02/02/13 2200  . morphine 2 MG/ML injection 1 mg  1 mg Intravenous Q1H PRN Runell Gess, MD   1 mg at 02/02/13 1446  . ondansetron (ZOFRAN) injection 4 mg  4 mg Intravenous Q6H PRN Runell Gess,  MD      . oxyCODONE-acetaminophen (PERCOCET/ROXICET) 5-325 MG per tablet 1 tablet  1 tablet Oral Q4H PRN Runell Gess, MD      . pantoprazole (PROTONIX) EC tablet 40 mg  40 mg Oral Daily Runell Gess, MD   40 mg at 02/02/13 1743  . simvastatin (ZOCOR) tablet 40 mg  40 mg Oral QHS Runell Gess, MD   40 mg at 02/02/13 2159  . tamsulosin (FLOMAX) capsule 0.4 mg  0.4 mg Oral Daily Runell Gess, MD   0.4 mg at 02/02/13 1743  . zolpidem (AMBIEN) tablet 10 mg  10 mg Oral QHS PRN Runell Gess, MD        PE: General appearance: alert, cooperative and no distress Lungs: clear to auscultation bilaterally Heart: regular rate and rhythm, S1, S2 normal, no murmur, click, rub or gallop Extremities: no LEE, right groin: soft, non-tender, no ecchymosis, no drainage, no bruit Pulses: 2+ and symmetric 2+ radials, 2+ right DP, 0 Left DP Skin: warm and dry Neurologic: Grossly normal  Lab Results:   Recent Labs  02/02/13 0844 02/03/13 0500  WBC 7.8 9.1  HGB 15.8 14.6  HCT 45.1 42.0  PLT 167 158   BMET  Recent Labs  02/02/13 0844 02/03/13 0500  NA 135 135  K 4.1 4.1  CL 97 100  CO2 26 27  GLUCOSE 252* 215*  BUN 12 11  CREATININE 0.76 0.74  CALCIUM 9.3 9.0   PT/INR  Recent Labs  02/02/13 0844  LABPROT 12.4  INR 0.94    Studies/Results:  PV Angio 02/02/13 HEMODYNAMICS:  AO SYSTOLIC/AO DIASTOLIC: 150/80  ANGIOGRAPHIC RESULTS:  Contralateral access was obtained with a 5 [place] crossover catheter and 7 French 55 cm long length Ansel sheath. The ACT was measured at 242. Attempts were made to cross the long calcified left SFA CTL unsuccessfully with the Viance crossing catheter. Following this attempts were made with the angled Navicross & 035 Glidewire unsuccessfully. The procedure was then aborted.    Assessment/Plan  Principal Problem:   PAD (peripheral artery disease), Viance crossing of mid rt SFA, with diamond back athrectomy and debulking, chocolate balloon  and stent with IDEV.  Unsucessful revascularization Lt SFA  Active Problems:   Diabetes mellitus   Hypertension   Tobacco abuse   Claudication in peripheral vascular disease  Plan: S/P PV angiogram yesterday. The result was an unsuccessful attempt at revascularizing a calcified left SFA for lifestyle limiting claudication. Plan is to have the patient follow-up with Dr. Allyson Sabal in clinic on 7/10 to discuss further options. Labs are WNL. Vitals are stable. Right groin is stable. Plan for discharge today. Pt can resume Metformin on 7/2.     LOS: 1 day    Brittainy M. Delmer Islam 02/03/2013 9:11 AM  Agree with note written by Boyce Medici  PAC  S/P unsuccessful attempt at crossing long segment calcified LSFA CTO. Exam and labs OK. D/C home . ROV with me . CRF modification. Will discuss revascularization options when I see him in the office   Sena Hoopingarner J 02/03/2013 10:14 AM

## 2013-02-12 ENCOUNTER — Encounter: Payer: Self-pay | Admitting: Cardiovascular Disease

## 2013-02-12 ENCOUNTER — Ambulatory Visit (INDEPENDENT_AMBULATORY_CARE_PROVIDER_SITE_OTHER): Payer: Medicare Other | Admitting: Cardiovascular Disease

## 2013-02-12 VITALS — BP 126/82 | HR 80 | Ht 71.0 in | Wt 186.8 lb

## 2013-02-12 DIAGNOSIS — I739 Peripheral vascular disease, unspecified: Secondary | ICD-10-CM

## 2013-02-12 NOTE — Assessment & Plan Note (Signed)
Patient has highly calcified segmental chronic total occlusions of his SFAs bilaterally. I performed right SFA diamondback orbital or tissue atherectomy, PTA and stenting several months ago and brought the patient back last month for attempt at left SFA intervention. I was unable to cross despite multiple attempts using the Viance crossing device. Patient returns today for followup. He continues to have claudication. His followup Dopplers revealed a widely patent right SFA stent. He does reconstitute in the above-the-knee popliteal on the left and has three-vessel runoff of the abdomen anterior tibial. I'm going to refer him to Dr. Hoy Finlay at [facility] in [place] for attempt at retrograde percutaneous revascularization.

## 2013-02-12 NOTE — Progress Notes (Signed)
02/12/2013 [name]   February 20, 1961  [id number]  Primary Physician Fredirick Maudlin, MD Primary Cardiologist: Runell Gess MD Roseanne Reno  HPI:  52 year old mildly overweight single, though lives with his girlfriend, Caucasian male father of one child seen for peripheral vascular disease because of claudication and diminished ABIs. Risk factors include continued tobacco abuse of one pack -2 pks per day, diabetes and hyperlipidemia. He was catheterized by Dr. Marinus Maw 08/19/2012 demonstrated three-vessel disease. He ultimately underwent coronary artery bypass grafting x4 January 17 for LIMA to his LAD, a vein to the diagonal branch, ramus intermedius branch and to the RCA. He tolerated the procedure well. His EF was 50-55% by 2-D echo. Dopplers revealed ABIs of 0.5 and 0.7. He complaind of left greater than right claudication at one block. He underwent percutaneous revascularization using the Viance CTO catheter, diamondback orbital rotational atherectomy, chocolate balloon and IDEV Stent successfully to the mid Rt SFA. He enjoyed an excellent angiographic and clinical result with improvement Dopplers. He underwent an attempt at percutaneous revascularization of his long segment calcified left SFA CTO for lifestyle limiting claudication.I was unable to cross the lesion with a Viance CTO catheter.the patient presents in followup. Continues to have left lower extremity claudication. Also continues to smoke. We discussed options including retrograde attempt at history catheterization by Dr. Hoy Finlay in [place] versus femoropopliteal bypass grafting. Since she's had coronary bypass grafting, I suspect he does not have adequate venous conduits left to perform lower shimmy surgical room randomization and what was thought to require PTFE.    Current Outpatient Prescriptions  Medication Sig Dispense Refill  . aspirin EC 325 MG tablet Take 325 mg by mouth daily.      . clopidogrel (PLAVIX) 75  MG tablet Take 1 tablet (75 mg total) by mouth daily with breakfast.  30 tablet  11  . CYMBALTA 60 MG capsule Take 60 mg by mouth daily.       Marland Kitchen dexlansoprazole (DEXILANT) 60 MG capsule Take 1 capsule (60 mg total) by mouth daily.  30 capsule  11  . folic acid (FOLVITE) 1 MG tablet Take 1 tablet (1 mg total) by mouth daily. For one month then stop.  30 tablet  0  . guaiFENesin (MUCINEX) 600 MG 12 hr tablet Take 1 tablet (600 mg total) by mouth every 12 (twelve) hours as needed for congestion.      Marland Kitchen HUMALOG MIX 75/25 KWIKPEN (75-25) 100 UNIT/ML SUPN Inject 15 units in the morning and 45 units at night.      Marland Kitchen lisinopril (PRINIVIL,ZESTRIL) 10 MG tablet Take 1 tablet (10 mg total) by mouth daily.  30 tablet  1  . metFORMIN (GLUCOPHAGE) 1000 MG tablet Take 1 tablet (1,000 mg total) by mouth 2 (two) times daily.      . metoprolol tartrate (LOPRESSOR) 25 MG tablet Take 1 tablet (25 mg total) by mouth 2 (two) times daily.  60 tablet  1  . oxyCODONE-acetaminophen (PERCOCET/ROXICET) 5-325 MG per tablet Take 1 tablet by mouth every 4 (four) hours as needed for pain. For pain  40 tablet  0  . simvastatin (ZOCOR) 40 MG tablet Take 40 mg by mouth at bedtime.       . Tamsulosin HCl (FLOMAX) 0.4 MG CAPS Take 0.4 mg by mouth daily.       Marland Kitchen zolpidem (AMBIEN) 10 MG tablet Take 10 mg by mouth at bedtime as needed for sleep.       No current facility-administered medications  for this visit.    No Known Allergies  History   Social History  . Marital Status: Single    Spouse Name: N/A    Number of Children: 1  . Years of Education: N/A   Occupational History  . disabled    Social History Main Topics  . Smoking status: Current Every Day Smoker -- 1.50 packs/day for 39 years    Types: Cigarettes  . Smokeless tobacco: Never Used  . Alcohol Use: 7.2 oz/week    12 Cans of beer per week  . Drug Use: No  . Sexually Active: Not Currently   Other Topics Concern  . Not on file   Social History Narrative     Lives w/ girlfriend     Review of Systems: General: negative for chills, fever, night sweats or weight changes.  Cardiovascular: negative for chest pain, dyspnea on exertion, edema, orthopnea, palpitations, paroxysmal nocturnal dyspnea or shortness of breath Dermatological: negative for rash Respiratory: negative for cough or wheezing Urologic: negative for hematuria Abdominal: negative for nausea, vomiting, diarrhea, bright red blood per rectum, melena, or hematemesis Neurologic: negative for visual changes, syncope, or dizziness All other systems reviewed and are otherwise negative except as noted above.    Blood pressure 126/82, pulse 80, height 5\' 11"  (1.803 m), weight 186 lb 12.8 oz (84.732 kg).  General appearance: alert and no distress Neck: no adenopathy, no carotid bruit, no JVD, supple, symmetrical, trachea midline and thyroid not enlarged, symmetric, no tenderness/mass/nodules Lungs: clear to auscultation bilaterally Heart: regular rate and rhythm, S1, S2 normal, no murmur, click, rub or gallop Extremities: extremities normal, atraumatic, no cyanosis or edema and 2 pedal pulse, absent left pedal pulses  EKG not performed today  ASSESSMENT AND PLAN:   Claudication in peripheral vascular disease Patient has highly calcified segmental chronic total occlusions of his SFAs bilaterally. I performed right SFA diamondback orbital or tissue atherectomy, PTA and stenting several months ago and brought the patient back last month for attempt at left SFA intervention. I was unable to cross despite multiple attempts using the Viance crossing device. Patient returns today for followup. He continues to have claudication. His followup Dopplers revealed a widely patent right SFA stent. He does reconstitute in the above-the-knee popliteal on the left and has three-vessel runoff of the abdomen anterior tibial. I'm going to refer him to Dr. Hoy Finlay at [facility] in [place] for attempt at  retrograde percutaneous revascularization.      Runell Gess MD FACP,FACC,FAHA, [facility] 02/12/2013 3:23 PM

## 2013-02-12 NOTE — Patient Instructions (Addendum)
Your physician recommends that you schedule a follow-up appointment in: [place] 3 months  Your physician recommends that you schedule a follow-up appointment in: Dr Allyson Sabal in 6 months

## 2013-02-16 ENCOUNTER — Ambulatory Visit: Payer: PRIVATE HEALTH INSURANCE | Admitting: Internal Medicine

## 2013-02-17 ENCOUNTER — Ambulatory Visit: Payer: PRIVATE HEALTH INSURANCE | Admitting: Internal Medicine

## 2013-04-30 ENCOUNTER — Other Ambulatory Visit (HOSPITAL_COMMUNITY): Payer: Self-pay | Admitting: Cardiology

## 2013-04-30 DIAGNOSIS — I739 Peripheral vascular disease, unspecified: Secondary | ICD-10-CM

## 2013-05-01 ENCOUNTER — Ambulatory Visit (HOSPITAL_COMMUNITY)
Admission: RE | Admit: 2013-05-01 | Discharge: 2013-05-01 | Disposition: A | Discharge status: Home / Self Care | Payer: Medicare Other | Source: Ambulatory Visit | Attending: Cardiology | Admitting: Cardiology

## 2013-05-01 ENCOUNTER — Ambulatory Visit (HOSPITAL_COMMUNITY)
Admission: RE | Admit: 2013-05-01 | Discharge: 2013-05-01 | Disposition: A | Discharge status: Home / Self Care | Payer: Medicare Other | Source: Ambulatory Visit | Attending: Cardiovascular Disease | Admitting: Cardiovascular Disease

## 2013-05-01 DIAGNOSIS — I739 Peripheral vascular disease, unspecified: Secondary | ICD-10-CM | POA: Insufficient documentation

## 2013-05-01 NOTE — Progress Notes (Signed)
Lower Extremity Arterial Duplex Completed. °Brianna L Mazza,RVT °

## 2013-05-01 NOTE — Progress Notes (Signed)
ABI Completed. °Brianna L Mazza,RVT °

## 2013-05-15 ENCOUNTER — Encounter: Payer: Self-pay | Admitting: *Deleted

## 2013-05-15 ENCOUNTER — Telehealth: Payer: Self-pay | Admitting: *Deleted

## 2013-05-15 DIAGNOSIS — I739 Peripheral vascular disease, unspecified: Secondary | ICD-10-CM

## 2013-05-15 NOTE — Telephone Encounter (Signed)
Order placed for repeat lower extremity doppler in 6 months

## 2013-05-15 NOTE — Telephone Encounter (Signed)
Message copied by Marella Bile on Fri May 15, 2013  5:45 PM ------      Message from: Runell Gess      Created: Mon May 11, 2013  2:03 PM       Normal ABI status post bilateral intervention. Repeat in 6 months ------

## 2013-06-23 ENCOUNTER — Ambulatory Visit (INDEPENDENT_AMBULATORY_CARE_PROVIDER_SITE_OTHER): Payer: Medicare Other | Admitting: Urology

## 2013-06-23 DIAGNOSIS — Z125 Encounter for screening for malignant neoplasm of prostate: Secondary | ICD-10-CM

## 2013-06-23 DIAGNOSIS — N529 Male erectile dysfunction, unspecified: Secondary | ICD-10-CM

## 2013-07-20 ENCOUNTER — Ambulatory Visit (HOSPITAL_COMMUNITY)
Admission: RE | Admit: 2013-07-20 | Discharge: 2013-07-20 | Disposition: A | Discharge status: Home / Self Care | Payer: Medicare Other | Source: Ambulatory Visit | Attending: Pulmonary Disease | Admitting: Pulmonary Disease

## 2013-07-20 ENCOUNTER — Other Ambulatory Visit (HOSPITAL_COMMUNITY): Payer: Self-pay | Admitting: Pulmonary Disease

## 2013-07-20 DIAGNOSIS — M25559 Pain in unspecified hip: Secondary | ICD-10-CM

## 2013-07-20 DIAGNOSIS — M1610 Unilateral primary osteoarthritis, unspecified hip: Secondary | ICD-10-CM | POA: Insufficient documentation

## 2013-07-20 DIAGNOSIS — M169 Osteoarthritis of hip, unspecified: Secondary | ICD-10-CM | POA: Insufficient documentation

## 2013-07-21 ENCOUNTER — Emergency Department (HOSPITAL_COMMUNITY)
Admission: EM | Admit: 2013-07-21 | Discharge: 2013-07-21 | Discharge status: Against Medical Advice | Payer: Medicare Other | Attending: Emergency Medicine | Admitting: Emergency Medicine

## 2013-07-21 ENCOUNTER — Encounter (HOSPITAL_COMMUNITY): Payer: Self-pay | Admitting: Emergency Medicine

## 2013-07-21 DIAGNOSIS — J4489 Other specified chronic obstructive pulmonary disease: Secondary | ICD-10-CM | POA: Insufficient documentation

## 2013-07-21 DIAGNOSIS — J449 Chronic obstructive pulmonary disease, unspecified: Secondary | ICD-10-CM | POA: Insufficient documentation

## 2013-07-21 DIAGNOSIS — I2510 Atherosclerotic heart disease of native coronary artery without angina pectoris: Secondary | ICD-10-CM | POA: Insufficient documentation

## 2013-07-21 DIAGNOSIS — R5381 Other malaise: Secondary | ICD-10-CM | POA: Insufficient documentation

## 2013-07-21 DIAGNOSIS — F17200 Nicotine dependence, unspecified, uncomplicated: Secondary | ICD-10-CM | POA: Insufficient documentation

## 2013-07-21 DIAGNOSIS — I10 Essential (primary) hypertension: Secondary | ICD-10-CM | POA: Insufficient documentation

## 2013-07-21 DIAGNOSIS — M545 Low back pain, unspecified: Secondary | ICD-10-CM | POA: Insufficient documentation

## 2013-07-21 DIAGNOSIS — E119 Type 2 diabetes mellitus without complications: Secondary | ICD-10-CM | POA: Insufficient documentation

## 2013-07-21 DIAGNOSIS — Z951 Presence of aortocoronary bypass graft: Secondary | ICD-10-CM | POA: Insufficient documentation

## 2013-07-21 DIAGNOSIS — Z95818 Presence of other cardiac implants and grafts: Secondary | ICD-10-CM | POA: Diagnosis not present

## 2013-07-21 LAB — URINALYSIS, ROUTINE W REFLEX MICROSCOPIC
Leukocytes, UA: NEGATIVE
Nitrite: NEGATIVE
Specific Gravity, Urine: <1.005 — ABNORMAL LOW (ref 1.005–1.030)
pH: 6.5 (ref 5.0–8.0)

## 2013-07-21 LAB — URINE MICROSCOPIC-ADD ON

## 2013-07-21 NOTE — ED Notes (Signed)
Pt called third time for room placement.  No response.

## 2013-07-21 NOTE — ED Notes (Signed)
Pt called second time for room placement. No response

## 2013-07-21 NOTE — ED Notes (Signed)
Pt called once for room placement.  No response 

## 2013-07-21 NOTE — ED Notes (Signed)
Pt c/o lower back pain radiating down both legs x 3 days.

## 2013-08-10 ENCOUNTER — Other Ambulatory Visit: Payer: Self-pay | Admitting: Gastroenterology

## 2013-08-19 ENCOUNTER — Ambulatory Visit: Payer: Medicare Other | Admitting: Internal Medicine

## 2013-08-20 ENCOUNTER — Encounter: Payer: Self-pay | Admitting: Internal Medicine

## 2013-08-20 ENCOUNTER — Ambulatory Visit (INDEPENDENT_AMBULATORY_CARE_PROVIDER_SITE_OTHER): Payer: Medicare Other | Admitting: Internal Medicine

## 2013-08-20 VITALS — BP 140/92 | HR 72 | Ht 71.0 in | Wt 196.9 lb

## 2013-08-20 DIAGNOSIS — E119 Type 2 diabetes mellitus without complications: Secondary | ICD-10-CM

## 2013-08-20 DIAGNOSIS — R600 Localized edema: Secondary | ICD-10-CM

## 2013-08-20 DIAGNOSIS — I10 Essential (primary) hypertension: Secondary | ICD-10-CM

## 2013-08-20 DIAGNOSIS — Z951 Presence of aortocoronary bypass graft: Secondary | ICD-10-CM

## 2013-08-20 DIAGNOSIS — I739 Peripheral vascular disease, unspecified: Secondary | ICD-10-CM

## 2013-08-20 DIAGNOSIS — Z79899 Other long term (current) drug therapy: Secondary | ICD-10-CM

## 2013-08-20 DIAGNOSIS — R609 Edema, unspecified: Secondary | ICD-10-CM

## 2013-08-20 DIAGNOSIS — I2510 Atherosclerotic heart disease of native coronary artery without angina pectoris: Secondary | ICD-10-CM

## 2013-08-20 DIAGNOSIS — E785 Hyperlipidemia, unspecified: Secondary | ICD-10-CM

## 2013-08-20 NOTE — Patient Instructions (Signed)
  We will see you back in follow up in 6 months with Dr Debara Pickett  Dr Debara Pickett has ordered blood work to be done, fasting

## 2013-08-22 ENCOUNTER — Encounter: Payer: Self-pay | Admitting: Internal Medicine

## 2013-08-22 DIAGNOSIS — R600 Localized edema: Secondary | ICD-10-CM | POA: Insufficient documentation

## 2013-08-22 NOTE — Progress Notes (Signed)
OFFICE NOTE  Chief Complaint:  Leg swelling, pain with ambulation  Primary Care Physician: Alonza Bogus, MD  HPI:  [name] is a 53 year old mildly overweight single, though lives with his girlfriend, Caucasian male father of one child seen for peripheral vascular disease because of claudication and diminished ABIs. Risk factors include continued tobacco abuse of one pack -2 pks per day, diabetes and hyperlipidemia. He was catheterized by Dr. Nicanor Bake 08/19/2012 demonstrated three-vessel disease. He ultimately underwent coronary artery bypass grafting x4 January 17 for LIMA to his LAD, a vein to the diagonal branch, ramus intermedius branch and to the RCA. He tolerated the procedure well. His EF was 50-55% by 2-D echo. Dopplers revealed ABIs of 0.5 and 0.7. He complaind of left greater than right claudication at one block. He underwent percutaneous revascularization using the Viance CTO catheter, diamondback orbital rotational atherectomy, chocolate balloon and IDEV Stent successfully to the mid Rt SFA. His main complaints are ongoing lower extremity swelling. This is fatigue and problems with his hips. He denies any chest pain or worsening shortness of breath.  He was recently sent to [facility] for additional peripheral arterial procedures to open the left SFA by Dr. [name] as Dr. [name] had great difficulty in crossing the lesion. Ultimately after 6 hours Dr. [name] was able to get across the lesion and improve arterial flow. Repeat Dopplers were performed recently demonstrating an ABI of 1.1 on the right and 1.0 on the left in September 2014.  He is also wondering today whether he can stop his Plavix.  PMHx:  Past Medical History  Diagnosis Date  . Esophageal erosions 05/24/01    egd by Dr. Gala Romney  . COPD (chronic obstructive pulmonary disease)   . Hyperlipidemia   . GERD (gastroesophageal reflux disease)   . H/O hiatal hernia   . Tubular adenoma of colon 08/28/11  . Unintentional  weight loss   . Hypertension   . Type II diabetes mellitus   . Arthritis     "some; in hips sometimes" (01/02/2013)  . Back pain     "w/prolonged walking or standing" (01/02/2013)  . PAD (peripheral artery disease)     LEA DOPPLER, 11/11/2012 - moderate arterial insufficiency to lower extremities at rest, BILATERAL SFA-demonstrates occlusive disease with reconstitution of flow  . CAD (coronary artery disease)     CABG X 4 08/2012  . Tobacco abuse     Past Surgical History  Procedure Laterality Date  . Esophagogastroduodenoscopy  05/24/01    by Dr. Gala Romney  . Back surgery    . Shoulder surgery Right   . Pelvic abcess drainage    . Anterior cervical decomp/discectomy fusion  12/19/2011    Procedure: ANTERIOR CERVICAL DECOMPRESSION/DISCECTOMY FUSION 2 LEVELS;  Surgeon: Floyce Stakes, MD;  Location: [facility];  Service: Neurosurgery;  Laterality: N/A;  Cervical four-five Cervical five-six  Anterior cervical decompression/diskectomy, fusion, plate  . Esophagogastroduodenoscopy  08/28/11    Rourk-erosive reflux esophagitis, gastric erosions  . Colonoscopy w/ polypectomy  08/28/11    Rourk-tubular adenomas removed from ascending colon, suboptimal prep, diminutive rectal polyps  . Coronary artery bypass graft  08/22/2012    Procedure: CORONARY ARTERY BYPASS GRAFTING (CABG);  Surgeon: Grace Isaac, MD;  Location: [place];  Service: Open Heart Surgery;  Laterality: N/A;  CABG x four,  using left internal mammary artery and right leg greater saphenous vein harvested endoscopically  . Intraoperative transesophageal echocardiogram  08/22/2012    Procedure: INTRAOPERATIVE TRANSESOPHAGEAL  ECHOCARDIOGRAM;  Surgeon: Grace Isaac, MD;  Location: [place];  Service: Open Heart Surgery;  Laterality: N/A;  . Vascular surgery Right 01/02/2013    diamondback orbital rotational  atherectomy, chocolate balloon and IDEV  Stent.  . Lumbar fusion  ~ 2011  . Lumbar disc surgery    . Cardiac catheterization   08/19/2012    CABG warranted; Severe ostial LCx stenosis with mid to distal vessel occlusion    FAMHx:  Family History  Problem Relation Age of Onset  . Anesthesia problems Neg Hx   . Colon cancer Neg Hx     SOCHx:   reports that he has been smoking Cigarettes.  He has a 58.5 pack-year smoking history. He has never used smokeless tobacco. He reports that he does not drink alcohol or use illicit drugs.  ALLERGIES:  No Known Allergies  ROS: A comprehensive review of systems was negative except for: Cardiovascular: positive for lower extremity edema Musculoskeletal: positive for back pain  HOME MEDS: Current Outpatient Prescriptions  Medication Sig Dispense Refill  . aspirin EC 325 MG tablet Take 325 mg by mouth every other day.       . clopidogrel (PLAVIX) 75 MG tablet Take 1 tablet (75 mg total) by mouth daily with breakfast.  30 tablet  11  . CYMBALTA 60 MG capsule Take 60 mg by mouth daily.       Marland Kitchen DEXILANT 60 MG capsule TAKE ONE CAPSULE BY MOUTH DAILY.  30 capsule  5  . guaiFENesin (MUCINEX) 600 MG 12 hr tablet Take 1 tablet (600 mg total) by mouth every 12 (twelve) hours as needed for congestion.      Marland Kitchen HUMALOG MIX 75/25 KWIKPEN (75-25) 100 UNIT/ML SUPN Inject 25 units in the morning and 55 units at night.      Marland Kitchen oxyCODONE-acetaminophen (PERCOCET/ROXICET) 5-325 MG per tablet Take 1 tablet by mouth every 4 (four) hours as needed for pain. For pain  40 tablet  0  . PROAIR HFA 108 (90 BASE) MCG/ACT inhaler as needed.      . promethazine (PHENERGAN) 25 MG tablet as needed.      . simvastatin (ZOCOR) 40 MG tablet Take 40 mg by mouth at bedtime.        No current facility-administered medications for this visit.    LABS/IMAGING: No results found for this or any previous visit (from the past 48 hour(s)). No results found.  VITALS: BP 140/92  Pulse 72  Ht 5\' 11"  (1.803 m)  Wt 196 lb 14.4 oz (89.313 kg)  BMI 27.47 kg/m2  EXAM: General appearance: alert and no  distress Neck: no JVD and supple, symmetrical, trachea midline Lungs: clear to auscultation bilaterally Heart: regular rate and rhythm, S1, S2 normal, no murmur, click, rub or gallop Abdomen: soft, non-tender; bowel sounds normal; no masses,  no organomegaly Extremities: edema 1+ bilaterally Pulses: 2+ and symmetric Skin: Skin color, texture, turgor normal. No rashes or lesions Neurologic: Grossly normal Psych: Mildy irritable  EKG: Normal sinus rhythm at 72, inferior Q waves  ASSESSMENT: 1. Coronary disease status post four-vessel CABG in 2014 2. 60 pack year tobacco user 3. Insulin-dependent diabetes 4. Dyslipidemia 5. Significant PAD status post bilateral SFA intervention 6. Lower extremity edema  PLAN: 1.   Mr. Clarkson is doing fairly well except for some lower extremity swelling. He has been taking Lasix additionally from his wife's prescription for that. He did not have a significant amount of swelling today and most of it is  dependent edema. I do believe this is probably related to peripheral neuropathy as he does have significant back problems as well. I recommended elevation and compression stockings. We will also go ahead and check his lipid profile. Plan to see him back in 6 months or sooner as necessary. With regards to his Plavix, would like him to remain on that for this time as he should be at least on it for 6 months to one year after peripheral stenting.  More data is available recently about the long-term use of dual antiplatelet after coronary intervention and it appears to be favorable at least out to 3 years.  [name], MD, [facility] Attending Cardiologist [facility]  HILTY,Kenneth C 08/22/2013, 10:59 AM

## 2013-08-25 ENCOUNTER — Ambulatory Visit (INDEPENDENT_AMBULATORY_CARE_PROVIDER_SITE_OTHER): Payer: Medicare Other | Admitting: Urology

## 2013-08-25 DIAGNOSIS — N529 Male erectile dysfunction, unspecified: Secondary | ICD-10-CM

## 2013-08-28 ENCOUNTER — Other Ambulatory Visit (HOSPITAL_COMMUNITY): Payer: Self-pay | Admitting: Pulmonary Disease

## 2013-08-28 DIAGNOSIS — M549 Dorsalgia, unspecified: Secondary | ICD-10-CM

## 2013-09-01 ENCOUNTER — Ambulatory Visit (HOSPITAL_COMMUNITY)
Admission: RE | Admit: 2013-09-01 | Discharge: 2013-09-01 | Disposition: A | Discharge status: Home / Self Care | Payer: Medicare Other | Source: Ambulatory Visit | Attending: Pulmonary Disease | Admitting: Pulmonary Disease

## 2013-09-01 DIAGNOSIS — M545 Low back pain, unspecified: Secondary | ICD-10-CM | POA: Insufficient documentation

## 2013-09-01 DIAGNOSIS — M549 Dorsalgia, unspecified: Secondary | ICD-10-CM

## 2013-09-01 DIAGNOSIS — Z981 Arthrodesis status: Secondary | ICD-10-CM | POA: Insufficient documentation

## 2013-09-01 DIAGNOSIS — M5126 Other intervertebral disc displacement, lumbar region: Secondary | ICD-10-CM | POA: Insufficient documentation

## 2013-09-01 LAB — POCT I-STAT CREATININE: Creatinine, Ser: 1 mg/dL (ref 0.50–1.35)

## 2013-09-01 MED ORDER — GADOBENATE DIMEGLUMINE 529 MG/ML IV SOLN
18.0000 mL | Freq: Once | INTRAVENOUS | Status: AC | PRN
  Administered 2013-09-01: 18 mL via INTRAVENOUS

## 2013-09-02 ENCOUNTER — Telehealth: Payer: Self-pay | Admitting: Internal Medicine

## 2013-09-02 NOTE — Telephone Encounter (Signed)
Returned call and pt verified x 2.  Pt informed labs in computer system and he just needs to present fasting to have completed.  Advised he have the lab call if they have any problems.  Pt verbalized understanding and agreed w/ plan.

## 2013-09-02 NOTE — Telephone Encounter (Signed)
Needs a lab order mailed to him . Misplaced the one that he had .Marland Kitchen Please call if you have any questions .Marland Kitchen   Thanks

## 2013-09-09 ENCOUNTER — Telehealth: Payer: Self-pay | Admitting: *Deleted

## 2013-09-09 DIAGNOSIS — E785 Hyperlipidemia, unspecified: Secondary | ICD-10-CM

## 2013-09-09 LAB — NMR LIPOPROFILE WITH LIPIDS
Cholesterol, Total: 219 mg/dL — ABNORMAL HIGH
HDL Particle Number: 23.3 umol/L — ABNORMAL LOW
HDL Size: 10.4 nm
HDL-C: 31 mg/dL — ABNORMAL LOW
LDL (calc): 130 mg/dL — ABNORMAL HIGH
LDL Particle Number: 1462 nmol/L — ABNORMAL HIGH
LDL Size: 19.6 nm — ABNORMAL LOW
LP-IR Score: 52 — ABNORMAL HIGH
Large HDL-P: 5.6 umol/L
Large VLDL-P: 6.7 nmol/L — ABNORMAL HIGH
Small LDL Particle Number: 1290 nmol/L — ABNORMAL HIGH
Triglycerides: 292 mg/dL — ABNORMAL HIGH
VLDL Size: 48.5 nm — ABNORMAL HIGH

## 2013-09-09 LAB — COMPLETE METABOLIC PANEL WITH GFR
ALK PHOS: 88 U/L (ref 39–117)
ALT: 15 U/L (ref 0–53)
AST: 15 U/L (ref 0–37)
Albumin: 4.1 g/dL (ref 3.5–5.2)
BILIRUBIN TOTAL: 0.4 mg/dL (ref 0.2–1.2)
BUN: 9 mg/dL (ref 6–23)
CO2: 27 mEq/L (ref 19–32)
Calcium: 10 mg/dL (ref 8.4–10.5)
Chloride: 101 mEq/L (ref 96–112)
Creat: 0.8 mg/dL (ref 0.50–1.35)
GFR, Est African American: >89 mL/min
GLUCOSE: 85 mg/dL (ref 70–99)
POTASSIUM: 4.4 meq/L (ref 3.5–5.3)
SODIUM: 138 meq/L (ref 135–145)
TOTAL PROTEIN: 7.5 g/dL (ref 6.0–8.3)

## 2013-09-09 MED ORDER — ATORVASTATIN CALCIUM 80 MG PO TABS: 80.0000 mg | ORAL_TABLET | Freq: Every day | ORAL | Status: DC

## 2013-09-09 NOTE — Telephone Encounter (Signed)
Message copied by Fidel Levy on Wed Sep 09, 2013  4:45 PM ------      Message from: [name]      Created: Wed Sep 09, 2013  4:25 PM       Cholesterol is not well controlled.  Cannot increase simvastatin any higher. Can he switch to lipitor 80 mg daily? IF not, we can add zetia 10 mg daily to help him reach his goal.            Dr. Debara Pickett ------

## 2013-09-09 NOTE — Telephone Encounter (Signed)
Patient notified of lab results. Instructed to change from simvastatin to atorvastatin 80mg  daily. Re-check lipids in 3 months. Lab slip mailed to patient

## 2013-10-20 ENCOUNTER — Encounter (HOSPITAL_COMMUNITY): Payer: Medicare Other

## 2013-10-20 ENCOUNTER — Ambulatory Visit (HOSPITAL_COMMUNITY)
Admission: RE | Admit: 2013-10-20 | Discharge: 2013-10-20 | Disposition: A | Discharge status: Home / Self Care | Payer: Medicare Other | Source: Ambulatory Visit | Attending: Cardiovascular Disease | Admitting: Cardiovascular Disease

## 2013-10-20 DIAGNOSIS — I70219 Atherosclerosis of native arteries of extremities with intermittent claudication, unspecified extremity: Secondary | ICD-10-CM

## 2013-10-20 DIAGNOSIS — I739 Peripheral vascular disease, unspecified: Secondary | ICD-10-CM | POA: Insufficient documentation

## 2013-10-20 NOTE — Progress Notes (Signed)
Arterial Duplex Lower Ext. Completed. Nana Vastine, BS, RDMS, RVT  

## 2013-10-23 ENCOUNTER — Telehealth: Payer: Self-pay | Admitting: *Deleted

## 2013-10-23 DIAGNOSIS — I739 Peripheral vascular disease, unspecified: Secondary | ICD-10-CM

## 2013-10-23 NOTE — Telephone Encounter (Signed)
Patient notified of results and of need for repeat doppler in 1 year. LEA duplex ordered

## 2013-10-23 NOTE — Telephone Encounter (Signed)
Message copied by Fidel Levy on Fri Oct 23, 2013  4:55 PM ------      Message from: [name]      Created: Fri Oct 23, 2013  4:52 PM       Leg arterial dopplers look okay - stent is open. Repeat study in 1 year.            Dr. Debara Pickett ------

## 2013-10-26 ENCOUNTER — Telehealth: Payer: Self-pay | Admitting: Internal Medicine

## 2013-12-08 LAB — NMR LIPOPROFILE WITH LIPIDS
Cholesterol, Total: 160 mg/dL (ref <200)
HDL Particle Number: 29.8 umol/L — ABNORMAL LOW (ref >=30.5)
HDL Size: 8.8 nm — ABNORMAL LOW (ref >=9.2)
HDL-C: 40 mg/dL (ref >=40)
LARGE VLDL-P: 4.5 nmol/L — AB (ref <=2.7)
LDL CALC: 84 mg/dL (ref <100)
LDL PARTICLE NUMBER: 1576 nmol/L — AB (ref <1000)
LDL Size: 19.9 nm — ABNORMAL LOW (ref >20.5)
LP-IR SCORE: 67 — AB (ref <=45)
Large HDL-P: 2.1 umol/L — ABNORMAL LOW (ref >=4.8)
Small LDL Particle Number: 1188 nmol/L — ABNORMAL HIGH (ref <=527)
TRIGLYCERIDES: 182 mg/dL — AB (ref <150)
VLDL SIZE: 46.5 nm (ref <=46.6)

## 2013-12-10 ENCOUNTER — Encounter: Payer: Self-pay | Admitting: *Deleted

## 2013-12-15 NOTE — Telephone Encounter (Signed)
Close encounter 

## 2014-01-06 ENCOUNTER — Other Ambulatory Visit: Payer: Self-pay | Admitting: *Deleted

## 2014-01-06 MED ORDER — CLOPIDOGREL BISULFATE 75 MG PO TABS: 75.0000 mg | ORAL_TABLET | Freq: Every day | ORAL | Status: DC

## 2014-01-06 NOTE — Telephone Encounter (Signed)
Rx was sent to pharmacy electronically. 

## 2014-01-29 ENCOUNTER — Other Ambulatory Visit: Payer: Self-pay | Admitting: *Deleted

## 2014-01-29 MED ORDER — ATORVASTATIN CALCIUM 80 MG PO TABS: 80.0000 mg | ORAL_TABLET | Freq: Every day | ORAL | Status: DC

## 2014-01-29 NOTE — Telephone Encounter (Signed)
Rx refill sent to patient pharmacy   

## 2014-02-15 ENCOUNTER — Other Ambulatory Visit: Payer: Self-pay | Admitting: *Deleted

## 2014-02-15 MED ORDER — DEXLANSOPRAZOLE 60 MG PO CPDR: DELAYED_RELEASE_CAPSULE | ORAL | Status: DC

## 2014-03-30 IMAGING — CR DG CHEST 2V
2 series · 2 of 2 positions shown · non-contrast
Comparison: 07/02/2012.

CLINICAL DATA: Preop for cardiac surgery.

CHEST - 2 VIEW

[view not recorded (1 of 2)]
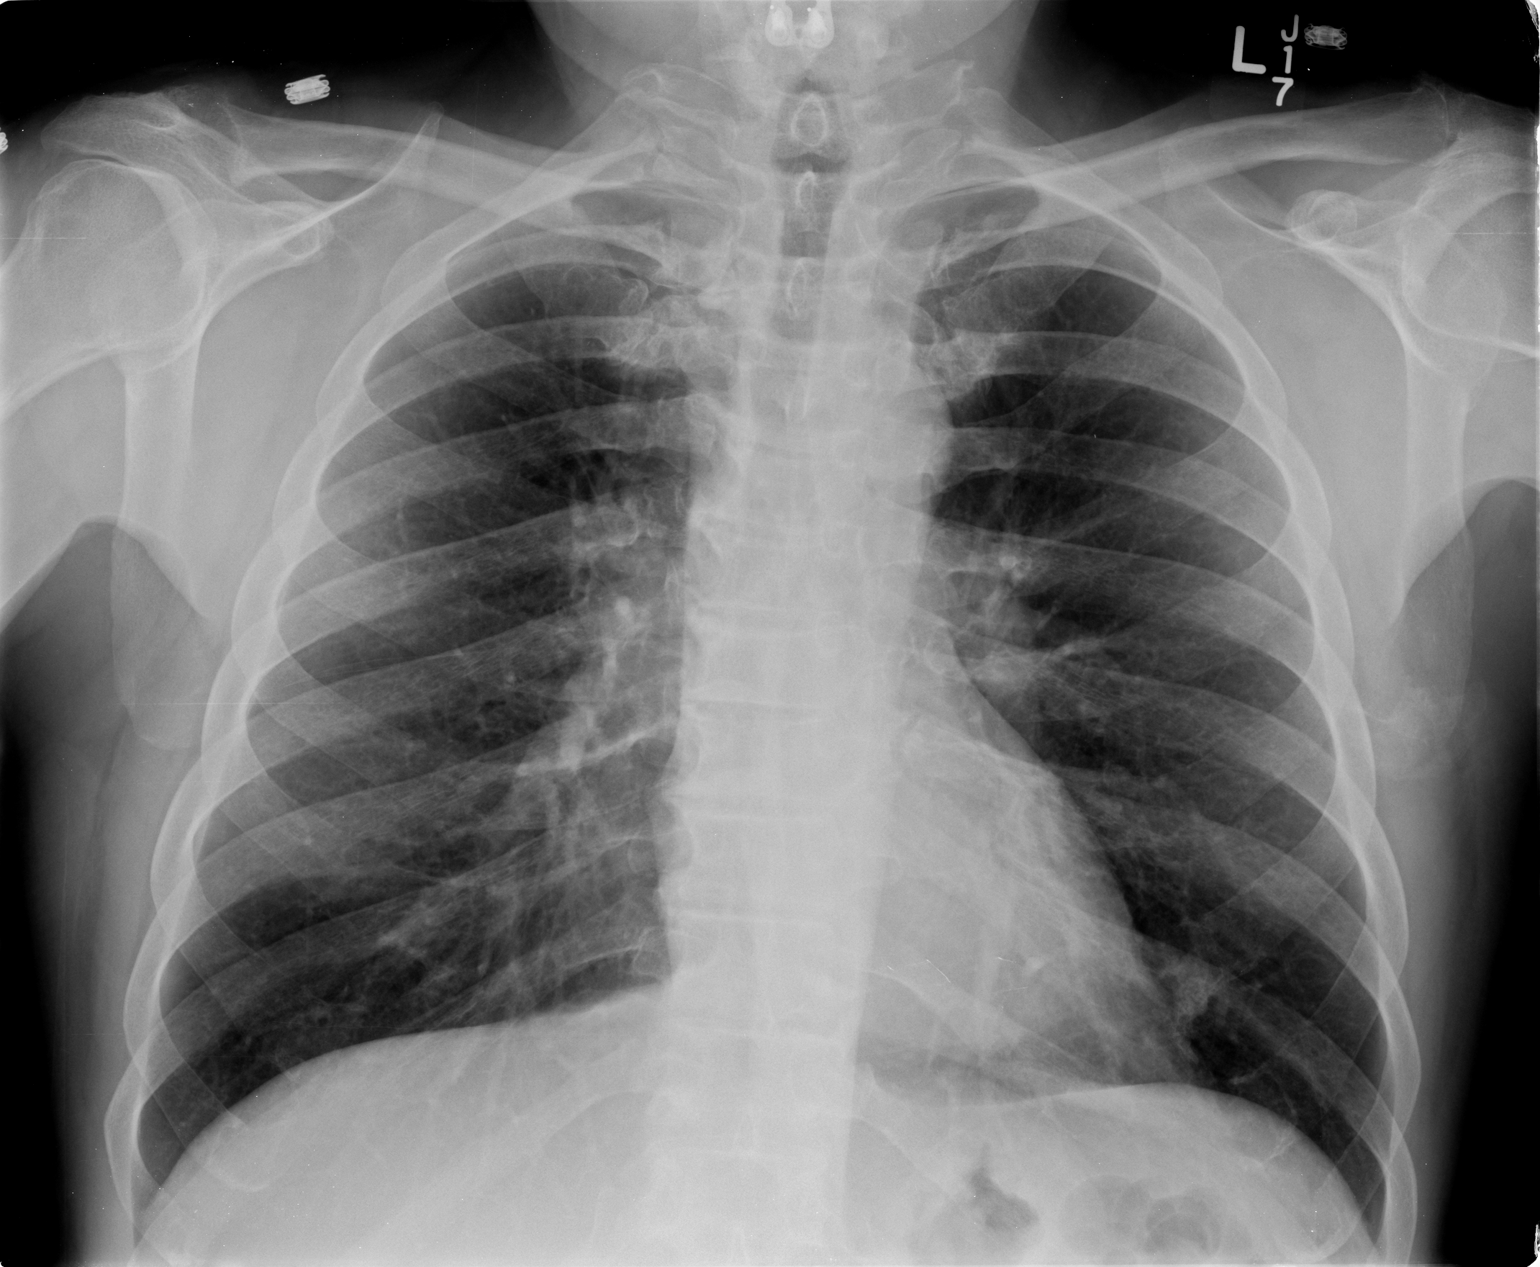

[view not recorded (2 of 2)]
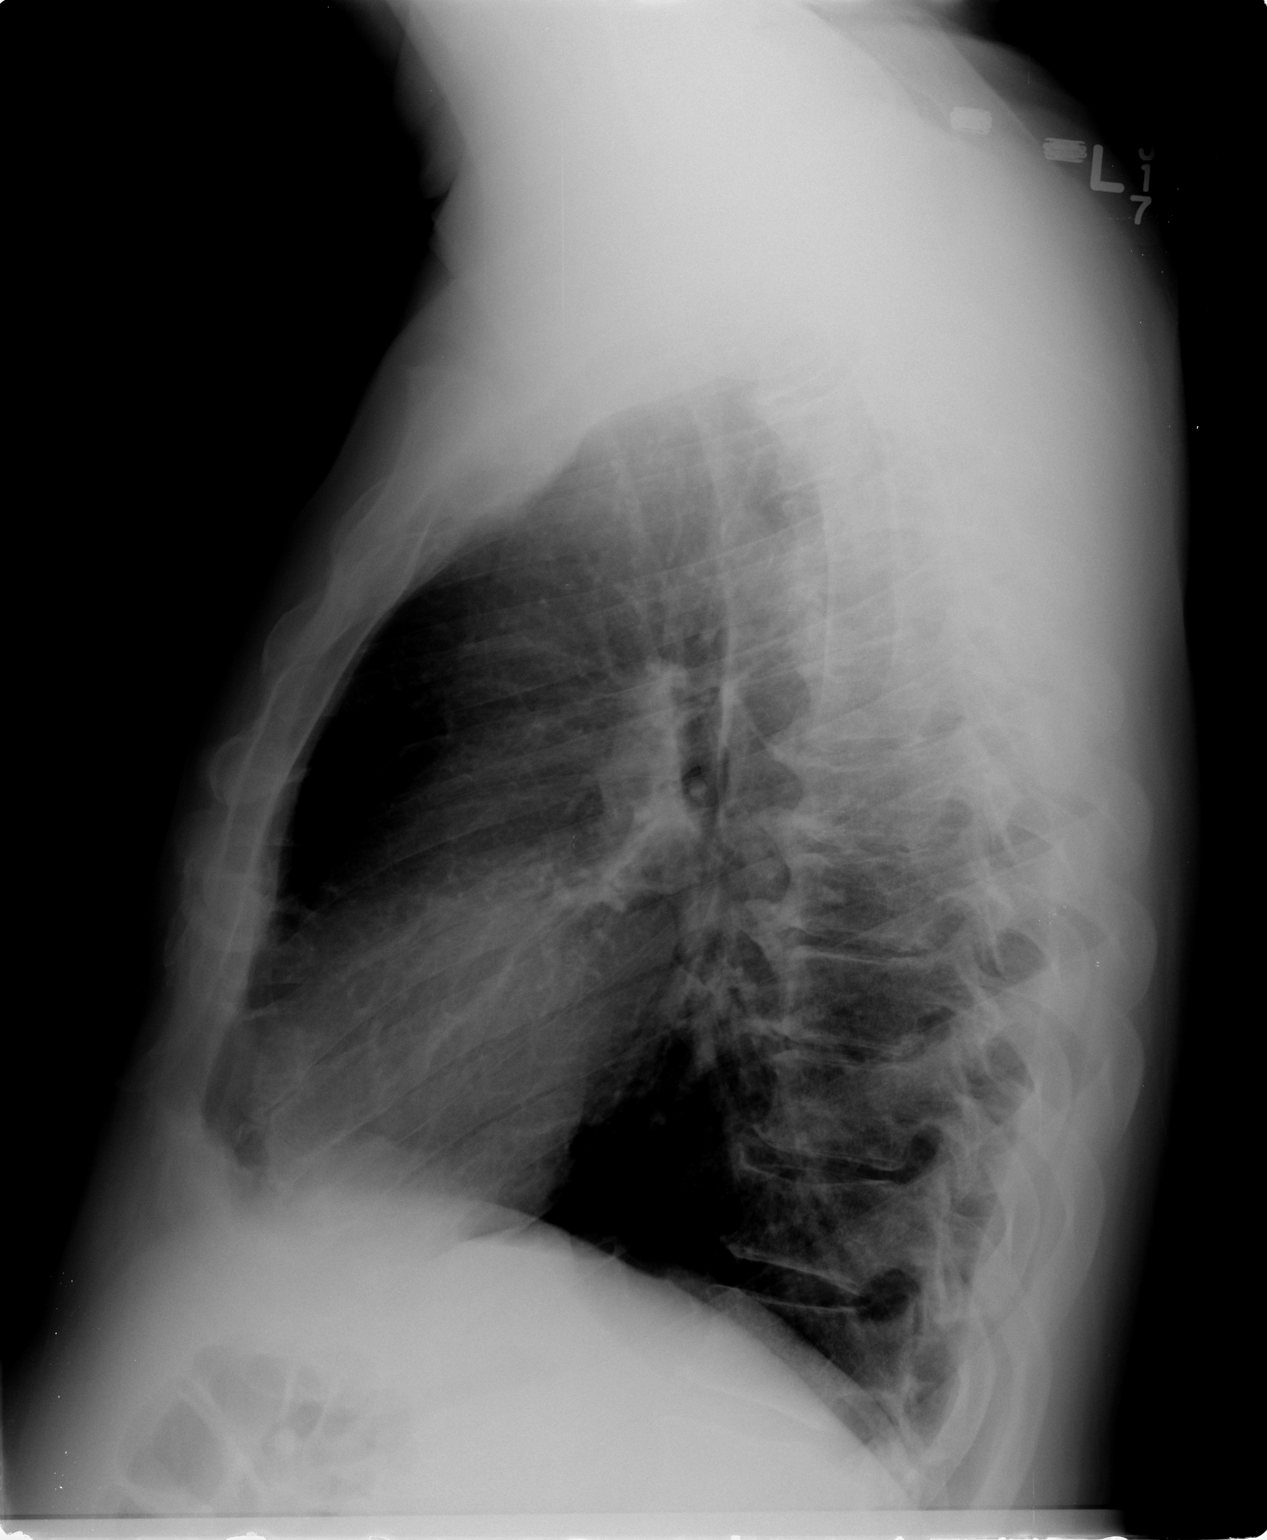

[2 of 2 positions shown; findings below may reference images not displayed]

FINDINGS: The cardiac silhouette, mediastinal and hilar contours
are within normal limits and stable.  The lungs are clear acute
process.  Stable nodular density at the left lung base.  No pleural
effusion or pulmonary edema.  The bony thorax is intact.
IMPRESSION: No acute cardiopulmonary findings.  Stable nodular density in the
left lower lobe.

## 2014-04-29 ENCOUNTER — Encounter: Payer: Self-pay | Admitting: Internal Medicine

## 2014-04-29 ENCOUNTER — Ambulatory Visit (INDEPENDENT_AMBULATORY_CARE_PROVIDER_SITE_OTHER): Payer: Commercial Managed Care - HMO | Admitting: Internal Medicine

## 2014-04-29 VITALS — BP 154/90 | HR 66 | Ht 71.0 in | Wt 189.4 lb

## 2014-04-29 DIAGNOSIS — Z951 Presence of aortocoronary bypass graft: Secondary | ICD-10-CM

## 2014-04-29 DIAGNOSIS — I2510 Atherosclerotic heart disease of native coronary artery without angina pectoris: Secondary | ICD-10-CM

## 2014-04-29 DIAGNOSIS — F17200 Nicotine dependence, unspecified, uncomplicated: Secondary | ICD-10-CM

## 2014-04-29 DIAGNOSIS — E089 Diabetes mellitus due to underlying condition without complications: Secondary | ICD-10-CM

## 2014-04-29 DIAGNOSIS — I739 Peripheral vascular disease, unspecified: Secondary | ICD-10-CM

## 2014-04-29 DIAGNOSIS — E139 Other specified diabetes mellitus without complications: Secondary | ICD-10-CM

## 2014-04-29 DIAGNOSIS — Z720 Tobacco use: Secondary | ICD-10-CM

## 2014-04-29 NOTE — Patient Instructions (Signed)
Your physician wants you to follow-up in: 1 year with Dr. Debara Pickett. You will receive a reminder letter in the mail two months in advance. If you don't receive a letter, please call our office to schedule the follow-up appointment.  Smoking Cessation Quitting smoking is important to your health and has many advantages. However, it is not always easy to quit since nicotine is a very addictive drug. Oftentimes, people try 3 times or more before being able to quit. This document explains the best ways for you to prepare to quit smoking. Quitting takes hard work and a lot of effort, but you can do it. ADVANTAGES OF QUITTING SMOKING  You will live longer, feel better, and live better.  Your body will feel the impact of quitting smoking almost immediately.  Within 20 minutes, blood pressure decreases. Your pulse returns to its normal level.  After 8 hours, carbon monoxide levels in the blood return to normal. Your oxygen level increases.  After 24 hours, the chance of having a heart attack starts to decrease. Your breath, hair, and body stop smelling like smoke.  After 48 hours, damaged nerve endings begin to recover. Your sense of taste and smell improve.  After 72 hours, the body is virtually free of nicotine. Your bronchial tubes relax and breathing becomes easier.  After 2 to 12 weeks, lungs can hold more air. Exercise becomes easier and circulation improves.  The risk of having a heart attack, stroke, cancer, or lung disease is greatly reduced.  After 1 year, the risk of coronary heart disease is cut in half.  After 5 years, the risk of stroke falls to the same as a nonsmoker.  After 10 years, the risk of lung cancer is cut in half and the risk of other cancers decreases significantly.  After 15 years, the risk of coronary heart disease drops, usually to the level of a nonsmoker.  If you are pregnant, quitting smoking will improve your chances of having a healthy baby.  The people you  live with, especially any children, will be healthier.  You will have extra money to spend on things other than cigarettes. QUESTIONS TO THINK ABOUT BEFORE ATTEMPTING TO QUIT You may want to talk about your answers with your health care provider.  Why do you want to quit?  If you tried to quit in the past, what helped and what did not?  What will be the most difficult situations for you after you quit? How will you plan to handle them?  Who can help you through the tough times? Your family? Friends? A health care provider?  What pleasures do you get from smoking? What ways can you still get pleasure if you quit? Here are some questions to ask your health care provider:  How can you help me to be successful at quitting?  What medicine do you think would be best for me and how should I take it?  What should I do if I need more help?  What is smoking withdrawal like? How can I get information on withdrawal? GET READY  Set a quit date.  Change your environment by getting rid of all cigarettes, ashtrays, matches, and lighters in your home, car, or work. Do not let people smoke in your home.  Review your past attempts to quit. Think about what worked and what did not. GET SUPPORT AND ENCOURAGEMENT You have a better chance of being successful if you have help. You can get support in many ways.  Tell  your family, friends, and coworkers that you are going to quit and need their support. Ask them not to smoke around you.  Get individual, group, or telephone counseling and support. Programs are available at [employer] and health centers. Call your local health department for information about programs in your area.  Spiritual beliefs and practices may help some smokers quit.  Download a "quit meter" on your computer to keep track of quit statistics, such as how long you have gone without smoking, cigarettes not smoked, and money saved.  Get a self-help book about quitting smoking  and staying off tobacco. [place] yourself from urges to smoke. Talk to someone, go for a walk, or occupy your time with a task.  Change your normal routine. Take a different route to work. Drink tea instead of coffee. Eat breakfast in a different place.  Reduce your stress. Take a hot bath, exercise, or read a book.  Plan something enjoyable to do every day. Reward yourself for not smoking.  Explore interactive web-based programs that specialize in helping you quit. GET MEDICINE AND USE IT CORRECTLY Medicines can help you stop smoking and decrease the urge to smoke. Combining medicine with the above behavioral methods and support can greatly increase your chances of successfully quitting smoking.  Nicotine replacement therapy helps deliver nicotine to your body without the negative effects and risks of smoking. Nicotine replacement therapy includes nicotine gum, lozenges, inhalers, nasal sprays, and skin patches. Some may be available over-the-counter and others require a prescription.  Antidepressant medicine helps people abstain from smoking, but how this works is unknown. This medicine is available by prescription.  Nicotinic receptor partial agonist medicine simulates the effect of nicotine in your brain. This medicine is available by prescription. Ask your health care provider for advice about which medicines to use and how to use them based on your health history. Your health care provider will tell you what side effects to look out for if you choose to be on a medicine or therapy. Carefully read the information on the package. Do not use any other product containing nicotine while using a nicotine replacement product.  RELAPSE OR DIFFICULT SITUATIONS Most relapses occur within the first 3 months after quitting. Do not be discouraged if you start smoking again. Remember, most people try several times before finally quitting. You may have symptoms of  withdrawal because your body is used to nicotine. You may crave cigarettes, be irritable, feel very hungry, cough often, get headaches, or have difficulty concentrating. The withdrawal symptoms are only temporary. They are strongest when you first quit, but they will go away within 10-14 days. To reduce the chances of relapse, try to:  Avoid drinking alcohol. Drinking lowers your chances of successfully quitting.  Reduce the amount of caffeine you consume. Once you quit smoking, the amount of caffeine in your body increases and can give you symptoms, such as a rapid heartbeat, sweating, and anxiety.  Avoid smokers because they can make you want to smoke.  Do not let weight gain distract you. Many smokers will gain weight when they quit, usually less than 10 pounds. Eat a healthy diet and stay active. You can always lose the weight gained after you quit.  Find ways to improve your mood other than smoking. FOR MORE INFORMATION  www.smokefree.gov  Document Released: 07/17/2001 Document Revised: 12/07/2013 Document Reviewed: 11/01/2011 [facility] Patient Information 2015 [place], [place]. This information is not intended to replace advice given to  you by your health care provider. Make sure you discuss any questions you have with your health care provider.

## 2014-04-29 NOTE — Progress Notes (Signed)
OFFICE NOTE  Chief Complaint:  Occasional shortness of breath  Primary Care Physician: Alonza Bogus, MD  HPI:  [name] is a 53 year old mildly overweight single, though lives with his girlfriend, Caucasian male father of one child seen for peripheral vascular disease because of claudication and diminished ABIs. Risk factors include continued tobacco abuse of one pack -2 pks per day, diabetes and hyperlipidemia. He was catheterized by Dr. Nicanor Bake 08/19/2012 demonstrated three-vessel disease. He ultimately underwent coronary artery bypass grafting x4 January 17 for LIMA to his LAD, a vein to the diagonal branch, ramus intermedius branch and to the RCA. He tolerated the procedure well. His EF was 50-55% by 2-D echo. Dopplers revealed ABIs of 0.5 and 0.7. He complaind of left greater than right claudication at one block. He underwent percutaneous revascularization using the Viance CTO catheter, diamondback orbital rotational atherectomy, chocolate balloon and IDEV Stent successfully to the mid Rt SFA. His main complaints are ongoing lower extremity swelling. This is fatigue and problems with his hips. He denies any chest pain or worsening shortness of breath.  He was recently sent to [facility] for additional peripheral arterial procedures to open the left SFA by Dr. [name] as Dr. [name] had great difficulty in crossing the lesion. Ultimately after 6 hours Dr. [name] was able to get across the lesion and improve arterial flow. Repeat Dopplers were performed recently demonstrating an ABI of 1.1 on the right and 1.0 on the left in September 2014.  He is also wondering today whether he can stop his Plavix.  [name] returns today for followup. He is doing quite well. He recent saw Dr. Luan Pulling. He was concerned about his ongoing smoking. He reports his blood sugars are improved however they are still not at goal. He denies any claudication symptoms and had ultrasound in March of this year which shows  excellent blood flow to his legs after stenting.   PMHx:  Past Medical History  Diagnosis Date  . Esophageal erosions 05/24/01    egd by Dr. Gala Romney  . COPD (chronic obstructive pulmonary disease)   . Hyperlipidemia   . GERD (gastroesophageal reflux disease)   . H/O hiatal hernia   . Tubular adenoma of colon 08/28/11  . Unintentional weight loss   . Hypertension   . Type II diabetes mellitus   . Arthritis     "some; in hips sometimes" (01/02/2013)  . Back pain     "w/prolonged walking or standing" (01/02/2013)  . PAD (peripheral artery disease)     LEA DOPPLER, 11/11/2012 - moderate arterial insufficiency to lower extremities at rest, BILATERAL SFA-demonstrates occlusive disease with reconstitution of flow  . CAD (coronary artery disease)     CABG X 4 08/2012  . Tobacco abuse     Past Surgical History  Procedure Laterality Date  . Esophagogastroduodenoscopy  05/24/01    by Dr. Gala Romney  . Back surgery    . Shoulder surgery Right   . Pelvic abcess drainage    . Anterior cervical decomp/discectomy fusion  12/19/2011    Procedure: ANTERIOR CERVICAL DECOMPRESSION/DISCECTOMY FUSION 2 LEVELS;  Surgeon: Floyce Stakes, MD;  Location: [facility];  Service: Neurosurgery;  Laterality: N/A;  Cervical four-five Cervical five-six  Anterior cervical decompression/diskectomy, fusion, plate  . Esophagogastroduodenoscopy  08/28/11    Rourk-erosive reflux esophagitis, gastric erosions  . Colonoscopy w/ polypectomy  08/28/11    Rourk-tubular adenomas removed from ascending colon, suboptimal prep, diminutive rectal polyps  . Coronary artery bypass  graft  08/22/2012    Procedure: CORONARY ARTERY BYPASS GRAFTING (CABG);  Surgeon: Grace Isaac, MD;  Location: [place];  Service: Open Heart Surgery;  Laterality: N/A;  CABG x four,  using left internal mammary artery and right leg greater saphenous vein harvested endoscopically  . Intraoperative transesophageal echocardiogram  08/22/2012    Procedure:  INTRAOPERATIVE TRANSESOPHAGEAL ECHOCARDIOGRAM;  Surgeon: Grace Isaac, MD;  Location: [place];  Service: Open Heart Surgery;  Laterality: N/A;  . Vascular surgery Right 01/02/2013    diamondback orbital rotational  atherectomy, chocolate balloon and IDEV  Stent.  . Lumbar fusion  ~ 2011  . Lumbar disc surgery    . Cardiac catheterization  08/19/2012    CABG warranted; Severe ostial LCx stenosis with mid to distal vessel occlusion    FAMHx:  Family History  Problem Relation Age of Onset  . Anesthesia problems Neg Hx   . Colon cancer Neg Hx     SOCHx:   reports that he has been smoking Cigarettes.  He has a 58.5 pack-year smoking history. He has never used smokeless tobacco. He reports that he does not drink alcohol or use illicit drugs.  ALLERGIES:  No Known Allergies  ROS: A comprehensive review of systems was negative except for: Respiratory: positive for dyspnea on exertion Musculoskeletal: positive for back pain  HOME MEDS: Current Outpatient Prescriptions  Medication Sig Dispense Refill  . aspirin EC 325 MG tablet Take 325 mg by mouth every other day.       Marland Kitchen atorvastatin (LIPITOR) 80 MG tablet Take 1 tablet (80 mg total) by mouth daily.  90 tablet  2  . clopidogrel (PLAVIX) 75 MG tablet Take 1 tablet (75 mg total) by mouth daily with breakfast.  30 tablet  7  . CYMBALTA 60 MG capsule Take 60 mg by mouth daily.       Marland Kitchen dexlansoprazole (DEXILANT) 60 MG capsule TAKE ONE CAPSULE BY MOUTH DAILY.  30 capsule  5  . guaiFENesin (MUCINEX) 600 MG 12 hr tablet Take 1 tablet (600 mg total) by mouth every 12 (twelve) hours as needed for congestion.      Marland Kitchen HUMALOG MIX 75/25 KWIKPEN (75-25) 100 UNIT/ML SUPN Inject 25 units in the morning and 55 units at night.      Marland Kitchen oxyCODONE-acetaminophen (PERCOCET/ROXICET) 5-325 MG per tablet Take 1 tablet by mouth every 4 (four) hours as needed for pain. For pain  40 tablet  0  . PROAIR HFA 108 (90 BASE) MCG/ACT inhaler as needed.      . promethazine  (PHENERGAN) 25 MG tablet as needed.       No current facility-administered medications for this visit.    LABS/IMAGING: No results found for this or any previous visit (from the past 48 hour(s)). No results found.  VITALS: BP 154/90  Pulse 66  Ht 5\' 11"  (1.803 m)  Wt 189 lb 6.4 oz (85.911 kg)  BMI 26.43 kg/m2  EXAM: General appearance: alert and no distress Neck: no JVD and supple, symmetrical, trachea midline Lungs: clear to auscultation bilaterally Heart: regular rate and rhythm, S1, S2 normal, no murmur, click, rub or gallop Abdomen: soft, non-tender; bowel sounds normal; no masses,  no organomegaly Extremities: edema 1+ bilaterally Pulses: 2+ and symmetric Skin: Skin color, texture, turgor normal. No rashes or lesions Neurologic: Grossly normal Psych: Mildy irritable  EKG: Normal sinus rhythm at 66  ASSESSMENT: 1. Coronary disease status post four-vessel CABG in 2014 2. 60 pack year tobacco user  3. Insulin-dependent diabetes 4. Dyslipidemia 5. Significant PAD status post bilateral SFA intervention 6. Lower extremity edema  PLAN: 1.   Mr. Croghan has had improvement in his leg edema. This is mostly dependent and I think related to neuropathy. His ongoing back pain. His diabetes has been poorly controlled but is better. He continued to work with his primary care provider on this. Unfortunately he is restarted smoking again. He needs to quit. He is having no chest pain. Plan to see him back annually or sooner as necessary.  [name], MD, [facility] Attending Cardiologist [facility]  Meggin Ola C 04/29/2014, 10:52 AM

## 2014-07-06 ENCOUNTER — Other Ambulatory Visit: Payer: Self-pay

## 2014-07-06 MED ORDER — ATORVASTATIN CALCIUM 80 MG PO TABS: 80.0000 mg | ORAL_TABLET | Freq: Every day | ORAL | Status: DC

## 2014-07-06 NOTE — Telephone Encounter (Signed)
Rx sent to pharmacy   

## 2014-07-15 ENCOUNTER — Encounter (HOSPITAL_COMMUNITY): Payer: Self-pay | Admitting: Internal Medicine

## 2014-08-10 ENCOUNTER — Other Ambulatory Visit: Payer: Self-pay | Admitting: Gastroenterology

## 2014-10-13 ENCOUNTER — Telehealth (HOSPITAL_COMMUNITY): Payer: Self-pay | Admitting: *Deleted

## 2015-02-18 ENCOUNTER — Other Ambulatory Visit (HOSPITAL_COMMUNITY): Payer: Self-pay | Admitting: Pulmonary Disease

## 2015-02-18 DIAGNOSIS — R911 Solitary pulmonary nodule: Secondary | ICD-10-CM

## 2015-02-23 ENCOUNTER — Ambulatory Visit (HOSPITAL_COMMUNITY): Payer: Commercial Managed Care - HMO

## 2015-02-25 ENCOUNTER — Ambulatory Visit (HOSPITAL_COMMUNITY)
Admission: RE | Admit: 2015-02-25 | Discharge: 2015-02-25 | Disposition: A | Discharge status: Home / Self Care | Payer: Commercial Managed Care - HMO | Source: Ambulatory Visit | Attending: Pulmonary Disease | Admitting: Pulmonary Disease

## 2015-02-25 DIAGNOSIS — Z794 Long term (current) use of insulin: Secondary | ICD-10-CM | POA: Diagnosis not present

## 2015-02-25 DIAGNOSIS — E119 Type 2 diabetes mellitus without complications: Secondary | ICD-10-CM | POA: Insufficient documentation

## 2015-02-25 DIAGNOSIS — I2510 Atherosclerotic heart disease of native coronary artery without angina pectoris: Secondary | ICD-10-CM | POA: Insufficient documentation

## 2015-02-25 DIAGNOSIS — I10 Essential (primary) hypertension: Secondary | ICD-10-CM | POA: Diagnosis not present

## 2015-02-25 DIAGNOSIS — R911 Solitary pulmonary nodule: Secondary | ICD-10-CM

## 2015-02-25 DIAGNOSIS — J449 Chronic obstructive pulmonary disease, unspecified: Secondary | ICD-10-CM | POA: Insufficient documentation

## 2015-02-25 DIAGNOSIS — Z720 Tobacco use: Secondary | ICD-10-CM | POA: Diagnosis not present

## 2015-03-04 ENCOUNTER — Other Ambulatory Visit (HOSPITAL_COMMUNITY): Payer: Self-pay | Admitting: Orthopaedic Surgery

## 2015-03-04 DIAGNOSIS — M79604 Pain in right leg: Secondary | ICD-10-CM

## 2015-03-04 DIAGNOSIS — M545 Low back pain: Secondary | ICD-10-CM

## 2015-03-15 ENCOUNTER — Ambulatory Visit (HOSPITAL_COMMUNITY)
Admission: RE | Admit: 2015-03-15 | Discharge: 2015-03-15 | Disposition: A | Discharge status: Home / Self Care | Payer: Commercial Managed Care - HMO | Source: Ambulatory Visit | Attending: Orthopaedic Surgery | Admitting: Orthopaedic Surgery

## 2015-03-15 DIAGNOSIS — M545 Low back pain: Secondary | ICD-10-CM | POA: Diagnosis present

## 2015-03-15 DIAGNOSIS — M79604 Pain in right leg: Secondary | ICD-10-CM

## 2015-03-15 DIAGNOSIS — Z981 Arthrodesis status: Secondary | ICD-10-CM | POA: Insufficient documentation

## 2015-03-15 DIAGNOSIS — M47897 Other spondylosis, lumbosacral region: Secondary | ICD-10-CM | POA: Insufficient documentation

## 2015-03-15 DIAGNOSIS — M25552 Pain in left hip: Secondary | ICD-10-CM | POA: Diagnosis not present

## 2015-03-15 DIAGNOSIS — M25551 Pain in right hip: Secondary | ICD-10-CM | POA: Insufficient documentation

## 2015-04-04 ENCOUNTER — Ambulatory Visit (HOSPITAL_COMMUNITY): Payer: Commercial Managed Care - HMO | Attending: Orthopaedic Surgery | Admitting: Physical Therapy

## 2015-05-03 ENCOUNTER — Ambulatory Visit: Payer: Commercial Managed Care - HMO | Admitting: Internal Medicine

## 2015-05-03 ENCOUNTER — Ambulatory Visit (INDEPENDENT_AMBULATORY_CARE_PROVIDER_SITE_OTHER): Payer: Commercial Managed Care - HMO | Admitting: Internal Medicine

## 2015-05-03 VITALS — BP 142/86 | HR 61 | Ht 71.0 in | Wt 173.5 lb

## 2015-05-03 DIAGNOSIS — I739 Peripheral vascular disease, unspecified: Secondary | ICD-10-CM | POA: Diagnosis not present

## 2015-05-03 DIAGNOSIS — M5440 Lumbago with sciatica, unspecified side: Secondary | ICD-10-CM

## 2015-05-03 DIAGNOSIS — R634 Abnormal weight loss: Secondary | ICD-10-CM | POA: Diagnosis not present

## 2015-05-03 DIAGNOSIS — Z951 Presence of aortocoronary bypass graft: Secondary | ICD-10-CM

## 2015-05-03 MED ORDER — CLOPIDOGREL BISULFATE 75 MG PO TABS: 75.0000 mg | ORAL_TABLET | Freq: Every day | ORAL | Status: AC

## 2015-05-03 NOTE — Patient Instructions (Signed)
Your physician has requested that you have a lower extremity arterial duplex. This test is an ultrasound of the arteries in the legs. It looks at arterial blood flow in the legs. Allow one hour for Lower Arterial scans. There are no restrictions or special instructions.  Dr Debara Pickett recommends that you schedule a follow-up appointment in 1 year. You will receive a reminder letter in the mail two months in advance. If you don't receive a letter, please call our office to schedule the follow-up appointment.

## 2015-05-04 ENCOUNTER — Ambulatory Visit: Payer: Commercial Managed Care - HMO | Admitting: Internal Medicine

## 2015-05-04 ENCOUNTER — Encounter: Payer: Self-pay | Admitting: Internal Medicine

## 2015-05-04 ENCOUNTER — Other Ambulatory Visit: Payer: Self-pay | Admitting: Internal Medicine

## 2015-05-04 DIAGNOSIS — M549 Dorsalgia, unspecified: Secondary | ICD-10-CM | POA: Insufficient documentation

## 2015-05-04 DIAGNOSIS — I739 Peripheral vascular disease, unspecified: Secondary | ICD-10-CM

## 2015-05-04 NOTE — Progress Notes (Signed)
OFFICE NOTE  Chief Complaint:  Back pain, unintentional weight loss  Primary Care Physician: Alonza Bogus, MD  HPI:  HOSIE SHARMAN is a 54 year old mildly overweight single, though lives with his girlfriend, Caucasian male father of one child seen for peripheral vascular disease because of claudication and diminished ABIs. Risk factors include continued tobacco abuse of one pack -2 pks per day, diabetes and hyperlipidemia. He was catheterized by Dr. Nicanor Bake 08/19/2012 demonstrated three-vessel disease. He ultimately underwent coronary artery bypass grafting x4 January 17 for LIMA to his LAD, a vein to the diagonal branch, ramus intermedius branch and to the RCA. He tolerated the procedure well. His EF was 50-55% by 2-D echo. Dopplers revealed ABIs of 0.5 and 0.7. He complaind of left greater than right claudication at one block. He underwent percutaneous revascularization using the Viance CTO catheter, diamondback orbital rotational atherectomy, chocolate balloon and IDEV Stent successfully to the mid Rt SFA. His main complaints are ongoing lower extremity swelling. This is fatigue and problems with his hips. He denies any chest pain or worsening shortness of breath.  He was recently sent to [facility] for additional peripheral arterial procedures to open the left SFA by Dr. [name] as Dr. [name] had great difficulty in crossing the lesion. Ultimately after 6 hours Dr. [name] was able to get across the lesion and improve arterial flow. Repeat Dopplers were performed recently demonstrating an ABI of 1.1 on the right and 1.0 on the left in September 2014.  He is also wondering today whether he can stop his Plavix.  Mr. Torbeck returns today for followup. He is doing quite well. He recent saw Dr. Luan Pulling. He was concerned about his ongoing smoking. He reports his blood sugars are improved however they are still not at goal. He denies any claudication symptoms and had ultrasound in March of this year which  shows excellent blood flow to his legs after stenting.   Saw Mr. Hamric back today for annual follow-up. Several weeks ago he had some electrical chest pain which lasted for just a few seconds while sitting on the couch. Otherwise he has no symptoms. Recently he's lost about 3-4 pounds every month for the past several months. He's plagued by chronic back pain and seems to have a good appetite. He's had significant workup including chest CT endoscopy and other tests including extensive lab work without any clear etiology of his weight loss. He is struggling from back pain and is planning to go see his neurosurgeon. He reports weakness and numbness and tingling in his legs.   PMHx:  Past Medical History  Diagnosis Date  . Esophageal erosions 05/24/01    egd by Dr. Gala Romney  . COPD (chronic obstructive pulmonary disease)   . Hyperlipidemia   . GERD (gastroesophageal reflux disease)   . H/O hiatal hernia   . Tubular adenoma of colon 08/28/11  . Unintentional weight loss   . Hypertension   . Type II diabetes mellitus   . Arthritis     "some; in hips sometimes" (01/02/2013)  . Back pain     "w/prolonged walking or standing" (01/02/2013)  . PAD (peripheral artery disease)     LEA DOPPLER, 11/11/2012 - moderate arterial insufficiency to lower extremities at rest, BILATERAL SFA-demonstrates occlusive disease with reconstitution of flow  . CAD (coronary artery disease)     CABG X 4 08/2012  . Tobacco abuse     Past Surgical History  Procedure Laterality Date  . Esophagogastroduodenoscopy  05/24/01  by Dr. Gala Romney  . Back surgery    . Shoulder surgery Right   . Pelvic abcess drainage    . Anterior cervical decomp/discectomy fusion  12/19/2011    Procedure: ANTERIOR CERVICAL DECOMPRESSION/DISCECTOMY FUSION 2 LEVELS;  Surgeon: Floyce Stakes, MD;  Location: [facility];  Service: Neurosurgery;  Laterality: N/A;  Cervical four-five Cervical five-six  Anterior cervical decompression/diskectomy, fusion,  plate  . Esophagogastroduodenoscopy  08/28/11    Rourk-erosive reflux esophagitis, gastric erosions  . Colonoscopy w/ polypectomy  08/28/11    Rourk-tubular adenomas removed from ascending colon, suboptimal prep, diminutive rectal polyps  . Coronary artery bypass graft  08/22/2012    Procedure: CORONARY ARTERY BYPASS GRAFTING (CABG);  Surgeon: Grace Isaac, MD;  Location: [place];  Service: Open Heart Surgery;  Laterality: N/A;  CABG x four,  using left internal mammary artery and right leg greater saphenous vein harvested endoscopically  . Intraoperative transesophageal echocardiogram  08/22/2012    Procedure: INTRAOPERATIVE TRANSESOPHAGEAL ECHOCARDIOGRAM;  Surgeon: Grace Isaac, MD;  Location: [place];  Service: Open Heart Surgery;  Laterality: N/A;  . Vascular surgery Right 01/02/2013    diamondback orbital rotational  atherectomy, chocolate balloon and IDEV  Stent.  . Lumbar fusion  ~ 2011  . Lumbar disc surgery    . Cardiac catheterization  08/19/2012    CABG warranted; Severe ostial LCx stenosis with mid to distal vessel occlusion  . Left heart catheterization with coronary angiogram N/A 08/19/2012    Procedure: LEFT HEART CATHETERIZATION WITH CORONARY ANGIOGRAM;  Surgeon: [name], MD;  Location: [facility] CATH LAB;  Service: Cardiovascular;  Laterality: N/A;  . Lower extremity angiogram N/A 11/17/2012    Procedure: LOWER EXTREMITY ANGIOGRAM;  Surgeon: Lorretta Harp, MD;  Location: [facility] CATH LAB;  Service: Cardiovascular;  Laterality: N/A;  . Atherectomy N/A 01/02/2013    Procedure: ATHERECTOMY;  Surgeon: Lorretta Harp, MD;  Location: [facility] CATH LAB;  Service: Cardiovascular;  Laterality: N/A;  . Lower extremity angiogram N/A 02/02/2013    Procedure: LOWER EXTREMITY ANGIOGRAM;  Surgeon: Lorretta Harp, MD;  Location: [facility] CATH LAB;  Service: Cardiovascular;  Laterality: N/A;    FAMHx:  Family History  Problem Relation Age of Onset  . Anesthesia problems Neg Hx   . Colon cancer Neg  Hx     SOCHx:   reports that he has been smoking Cigarettes.  He has a 58.5 pack-year smoking history. He has never used smokeless tobacco. He reports that he does not drink alcohol or use illicit drugs.  ALLERGIES:  No Known Allergies  ROS: A comprehensive review of systems was negative except for: Constitutional: positive for fatigue and weight loss Respiratory: positive for dyspnea on exertion Musculoskeletal: positive for back pain  HOME MEDS: Current Outpatient Prescriptions  Medication Sig Dispense Refill  . aspirin EC 325 MG tablet Take 325 mg by mouth every other day.     Marland Kitchen atorvastatin (LIPITOR) 80 MG tablet Take 1 tablet (80 mg total) by mouth daily. 90 tablet 3  . CYMBALTA 60 MG capsule Take 60 mg by mouth daily.     Marland Kitchen DEXILANT 60 MG capsule TAKE 1 CAPSULE EVERY DAY 90 capsule 5  . guaiFENesin (MUCINEX) 600 MG 12 hr tablet Take 1 tablet (600 mg total) by mouth every 12 (twelve) hours as needed for congestion.    Marland Kitchen HUMALOG MIX 75/25 KWIKPEN (75-25) 100 UNIT/ML SUPN Inject 25 units in the morning and 55 units at night.    Marland Kitchen  oxyCODONE-acetaminophen (PERCOCET/ROXICET) 5-325 MG per tablet Take 1 tablet by mouth every 4 (four) hours as needed for pain. For pain 40 tablet 0  . PROAIR HFA 108 (90 BASE) MCG/ACT inhaler as needed.    . promethazine (PHENERGAN) 25 MG tablet as needed.    . clopidogrel (PLAVIX) 75 MG tablet Take 1 tablet (75 mg total) by mouth daily with breakfast. 30 tablet 11   No current facility-administered medications for this visit.    LABS/IMAGING: No results found for this or any previous visit (from the past 48 hour(s)). No results found.  VITALS: BP 142/86 mmHg  Pulse 61  Ht 5\' 11"  (1.803 m)  Wt 173 lb 8 oz (78.699 kg)  BMI 24.21 kg/m2  EXAM: General appearance: alert and no distress Neck: no JVD and supple, symmetrical, trachea midline Lungs: clear to auscultation bilaterally Heart: regular rate and rhythm, S1, S2 normal, no murmur, click, rub  or gallop Abdomen: soft, non-tender; bowel sounds normal; no masses,  no organomegaly Extremities: edema 1+ bilaterally, bilateral leg muscle atrophy Pulses: 2+ and symmetric Skin: Skin color, texture, turgor normal. No rashes or lesions Neurologic: Grossly normal Psych: Mildy irritable  EKG: Normal sinus rhythm at 61  ASSESSMENT: 1. Coronary disease status post four-vessel CABG in 2014 2. 60 pack year tobacco user 3. Insulin-dependent diabetes 4. Dyslipidemia 5. Significant PAD status post bilateral SFA intervention 6. Lower extremity edema  PLAN: 1.   Mr. Weinand has had improvement in his leg edema. This is mostly dependent and I think related to neuropathy. His ongoing back pain. He's been having ongoing weight loss without a clear source. I appreciate some leg muscle wasting today which could account for his weight loss. I don't think this is due to significant PAD although he had prior SFA intervention. He is due for repeat lower extremity arterial Dopplers which I will order today. He scheduled to see a neurosurgeon in the near future to discuss his back pain.. Plan to continue his current medications we'll see him back annually  [name], MD, [facility] Attending Cardiologist [place] 05/04/2015, 6:21 PM

## 2015-05-10 ENCOUNTER — Ambulatory Visit (HOSPITAL_COMMUNITY)
Admission: RE | Admit: 2015-05-10 | Discharge: 2015-05-10 | Disposition: A | Discharge status: Home / Self Care | Payer: Commercial Managed Care - HMO | Source: Ambulatory Visit | Attending: Cardiovascular Disease | Admitting: Cardiovascular Disease

## 2015-05-10 DIAGNOSIS — E785 Hyperlipidemia, unspecified: Secondary | ICD-10-CM | POA: Diagnosis not present

## 2015-05-10 DIAGNOSIS — E119 Type 2 diabetes mellitus without complications: Secondary | ICD-10-CM | POA: Diagnosis not present

## 2015-05-10 DIAGNOSIS — I10 Essential (primary) hypertension: Secondary | ICD-10-CM | POA: Insufficient documentation

## 2015-05-10 DIAGNOSIS — F17200 Nicotine dependence, unspecified, uncomplicated: Secondary | ICD-10-CM | POA: Diagnosis not present

## 2015-05-10 DIAGNOSIS — I739 Peripheral vascular disease, unspecified: Secondary | ICD-10-CM | POA: Insufficient documentation

## 2015-06-01 ENCOUNTER — Ambulatory Visit (HOSPITAL_COMMUNITY)
Admission: RE | Admit: 2015-06-01 | Discharge: 2015-06-01 | Disposition: A | Discharge status: Home / Self Care | Payer: Commercial Managed Care - HMO | Source: Ambulatory Visit | Attending: Pulmonary Disease | Admitting: Pulmonary Disease

## 2015-06-01 ENCOUNTER — Other Ambulatory Visit (HOSPITAL_COMMUNITY): Payer: Self-pay | Admitting: Pulmonary Disease

## 2015-06-01 DIAGNOSIS — R05 Cough: Secondary | ICD-10-CM | POA: Diagnosis present

## 2015-06-01 DIAGNOSIS — R059 Cough, unspecified: Secondary | ICD-10-CM

## 2015-06-01 DIAGNOSIS — R079 Chest pain, unspecified: Secondary | ICD-10-CM | POA: Insufficient documentation

## 2016-02-23 DIAGNOSIS — J449 Chronic obstructive pulmonary disease, unspecified: Secondary | ICD-10-CM | POA: Diagnosis not present

## 2016-02-23 DIAGNOSIS — I119 Hypertensive heart disease without heart failure: Secondary | ICD-10-CM | POA: Diagnosis not present

## 2016-02-23 DIAGNOSIS — E1151 Type 2 diabetes mellitus with diabetic peripheral angiopathy without gangrene: Secondary | ICD-10-CM | POA: Diagnosis not present

## 2016-02-23 DIAGNOSIS — I2510 Atherosclerotic heart disease of native coronary artery without angina pectoris: Secondary | ICD-10-CM | POA: Diagnosis not present

## 2016-07-17 ENCOUNTER — Encounter: Payer: Self-pay | Admitting: Internal Medicine

## 2016-08-17 DIAGNOSIS — I2510 Atherosclerotic heart disease of native coronary artery without angina pectoris: Secondary | ICD-10-CM | POA: Diagnosis not present

## 2016-08-17 DIAGNOSIS — E1151 Type 2 diabetes mellitus with diabetic peripheral angiopathy without gangrene: Secondary | ICD-10-CM | POA: Diagnosis not present

## 2016-08-17 DIAGNOSIS — E1165 Type 2 diabetes mellitus with hyperglycemia: Secondary | ICD-10-CM | POA: Diagnosis not present

## 2016-08-17 DIAGNOSIS — I119 Hypertensive heart disease without heart failure: Secondary | ICD-10-CM | POA: Diagnosis not present

## 2016-08-17 DIAGNOSIS — E782 Mixed hyperlipidemia: Secondary | ICD-10-CM | POA: Diagnosis not present

## 2016-08-28 DIAGNOSIS — M545 Low back pain: Secondary | ICD-10-CM | POA: Diagnosis not present

## 2016-08-28 DIAGNOSIS — E1165 Type 2 diabetes mellitus with hyperglycemia: Secondary | ICD-10-CM | POA: Diagnosis not present

## 2016-08-28 DIAGNOSIS — I10 Essential (primary) hypertension: Secondary | ICD-10-CM | POA: Diagnosis not present

## 2016-08-28 DIAGNOSIS — J449 Chronic obstructive pulmonary disease, unspecified: Secondary | ICD-10-CM | POA: Diagnosis not present

## 2016-09-11 ENCOUNTER — Ambulatory Visit: Payer: Medicaid Other | Admitting: Internal Medicine

## 2016-10-04 ENCOUNTER — Encounter: Payer: Self-pay | Admitting: Internal Medicine

## 2016-10-04 ENCOUNTER — Ambulatory Visit (INDEPENDENT_AMBULATORY_CARE_PROVIDER_SITE_OTHER): Payer: Medicare Other | Admitting: Internal Medicine

## 2016-10-04 VITALS — BP 136/72 | HR 76 | Ht 71.0 in | Wt 176.8 lb

## 2016-10-04 DIAGNOSIS — I10 Essential (primary) hypertension: Secondary | ICD-10-CM | POA: Diagnosis not present

## 2016-10-04 DIAGNOSIS — E782 Mixed hyperlipidemia: Secondary | ICD-10-CM

## 2016-10-04 DIAGNOSIS — Z951 Presence of aortocoronary bypass graft: Secondary | ICD-10-CM | POA: Diagnosis not present

## 2016-10-04 DIAGNOSIS — I739 Peripheral vascular disease, unspecified: Secondary | ICD-10-CM

## 2016-10-04 NOTE — Progress Notes (Signed)
OFFICE NOTE  Chief Complaint:  No complaints  Primary Care Physician: Alonza Bogus, MD  HPI:  [name] is a 56 year old mildly overweight single, though lives with his girlfriend, Caucasian male father of one child seen for peripheral vascular disease because of claudication and diminished ABIs. Risk factors include continued tobacco abuse of one pack -2 pks per day, diabetes and hyperlipidemia. He was catheterized by Dr. Nicanor Bake 08/19/2012 demonstrated three-vessel disease. He ultimately underwent coronary artery bypass grafting x4 January 17 for LIMA to his LAD, a vein to the diagonal branch, ramus intermedius branch and to the RCA. He tolerated the procedure well. His EF was 50-55% by 2-D echo. Dopplers revealed ABIs of 0.5 and 0.7. He complaind of left greater than right claudication at one block. He underwent percutaneous revascularization using the Viance CTO catheter, diamondback orbital rotational atherectomy, chocolate balloon and IDEV Stent successfully to the mid Rt SFA. His main complaints are ongoing lower extremity swelling. This is fatigue and problems with his hips. He denies any chest pain or worsening shortness of breath.  He was recently sent to [facility] for additional peripheral arterial procedures to open the left SFA by Dr. [name] as Dr. [name] had great difficulty in crossing the lesion. Ultimately after 6 hours Dr. [name] was able to get across the lesion and improve arterial flow. Repeat Dopplers were performed recently demonstrating an ABI of 1.1 on the right and 1.0 on the left in September 2014.  He is also wondering today whether he can stop his Plavix.  [name] returns today for followup. He is doing quite well. He recent saw Dr. Luan Reed. He was concerned about his ongoing smoking. He reports his blood sugars are improved however they are still not at goal. He denies any claudication symptoms and had ultrasound in March of this year which shows excellent blood  flow to his legs after stenting.   Saw [name] back today for annual follow-up. Several weeks ago he had some electrical chest pain which lasted for just a few seconds while sitting on the couch. Otherwise he has no symptoms. Recently he's lost about 3-4 pounds every month for the past several months. He's plagued by chronic back pain and seems to have a good appetite. He's had significant workup including chest CT endoscopy and other tests including extensive lab work without any clear etiology of his weight loss. He is struggling from back pain and is planning to go see his neurosurgeon. He reports weakness and numbness and tingling in his legs.   10/04/2016  [name] returns today for follow-up. Overall he seems to be doing well. He continues to get some pain in his legs which is worse after he rests and is sitting for a period of time. He says it's like a burning sensation that gets better. He denies any claudication symptoms and had recent lower extremity arterial Dopplers which showed patent waveforms and normal blood flow. He was recently reassessed with regards to cholesterol and it remains elevated. He was previously on cholesterol medicine but that was discontinued for unknown reasons. He is now back on Crestor 20 mg daily. He's managed to cut back his smoking to half pack per day but is not yet motivated to quit. He understands the significant cardiovascular complications related to this. He is also on insulin-dependent diabetic and is not at goal A1c, recently he reported being over 8.  PMHx:  Past Medical History:  Diagnosis Date  . Arthritis    "  some; in hips sometimes" (01/02/2013)  . Back pain    "w/prolonged walking or standing" (01/02/2013)  . CAD (coronary artery disease)    CABG X 4 08/2012  . COPD (chronic obstructive pulmonary disease) ([place])   . Esophageal erosions 05/24/01   egd by Dr. Gala Romney  . GERD (gastroesophageal reflux disease)   . H/O hiatal hernia   . Hyperlipidemia    . Hypertension   . PAD (peripheral artery disease) ([place])    LEA DOPPLER, 11/11/2012 - moderate arterial insufficiency to lower extremities at rest, BILATERAL SFA-demonstrates occlusive disease with reconstitution of flow  . Tobacco abuse   . Tubular adenoma of colon 08/28/11  . Type II diabetes mellitus ([place])   . Unintentional weight loss     Past Surgical History:  Procedure Laterality Date  . ANTERIOR CERVICAL DECOMP/DISCECTOMY FUSION  12/19/2011   Procedure: ANTERIOR CERVICAL DECOMPRESSION/DISCECTOMY FUSION 2 LEVELS;  Surgeon: Floyce Stakes, MD;  Location: [facility];  Service: Neurosurgery;  Laterality: N/A;  Cervical four-five Cervical five-six  Anterior cervical decompression/diskectomy, fusion, plate  . ATHERECTOMY N/A 01/02/2013   Procedure: ATHERECTOMY;  Surgeon: Lorretta Harp, MD;  Location: [facility] CATH LAB;  Service: Cardiovascular;  Laterality: N/A;  . BACK SURGERY    . CARDIAC CATHETERIZATION  08/19/2012   CABG warranted; Severe ostial LCx stenosis with mid to distal vessel occlusion  . COLONOSCOPY W/ POLYPECTOMY  08/28/11   Rourk-tubular adenomas removed from ascending colon, suboptimal prep, diminutive rectal polyps  . CORONARY ARTERY BYPASS GRAFT  08/22/2012   Procedure: CORONARY ARTERY BYPASS GRAFTING (CABG);  Surgeon: Grace Isaac, MD;  Location: [place];  Service: Open Heart Surgery;  Laterality: N/A;  CABG x four,  using left internal mammary artery and right leg greater saphenous vein harvested endoscopically  . ESOPHAGOGASTRODUODENOSCOPY  05/24/01   by Dr. Gala Romney  . ESOPHAGOGASTRODUODENOSCOPY  08/28/11   Rourk-erosive reflux esophagitis, gastric erosions  . INTRAOPERATIVE TRANSESOPHAGEAL ECHOCARDIOGRAM  08/22/2012   Procedure: INTRAOPERATIVE TRANSESOPHAGEAL ECHOCARDIOGRAM;  Surgeon: Grace Isaac, MD;  Location: [place];  Service: Open Heart Surgery;  Laterality: N/A;  . LEFT HEART CATHETERIZATION WITH CORONARY ANGIOGRAM N/A 08/19/2012   Procedure: LEFT HEART  CATHETERIZATION WITH CORONARY ANGIOGRAM;  Surgeon: [name], MD;  Location: [facility] CATH LAB;  Service: Cardiovascular;  Laterality: N/A;  . LOWER EXTREMITY ANGIOGRAM N/A 11/17/2012   Procedure: LOWER EXTREMITY ANGIOGRAM;  Surgeon: Lorretta Harp, MD;  Location: [facility] CATH LAB;  Service: Cardiovascular;  Laterality: N/A;  . LOWER EXTREMITY ANGIOGRAM N/A 02/02/2013   Procedure: LOWER EXTREMITY ANGIOGRAM;  Surgeon: Lorretta Harp, MD;  Location: [facility] CATH LAB;  Service: Cardiovascular;  Laterality: N/A;  . LUMBAR [place]    . LUMBAR FUSION  ~ 2011  . PELVIC ABCESS DRAINAGE    . SHOULDER SURGERY Right   . VASCULAR SURGERY Right 01/02/2013   diamondback orbital rotational  atherectomy, chocolate balloon and IDEV  Stent.    FAMHx:  Family History  Problem Relation Age of Onset  . Anesthesia problems Neg Hx   . Colon cancer Neg Hx     SOCHx:   reports that he has been smoking Cigarettes.  He has a 58.50 pack-year smoking history. He has never used smokeless tobacco. He reports that he does not drink alcohol or use drugs.  ALLERGIES:  No Known Allergies  ROS: Pertinent items noted in HPI and remainder of comprehensive ROS otherwise negative.  HOME MEDS: Current Outpatient Prescriptions  Medication Sig Dispense Refill  .  aspirin EC 325 MG tablet Take 325 mg by mouth every other day.     . clopidogrel (PLAVIX) 75 MG tablet Take 1 tablet (75 mg total) by mouth daily with breakfast. 30 tablet 11  . CYMBALTA 60 MG capsule Take 60 mg by mouth daily.     Marland Kitchen DEXILANT 60 MG capsule TAKE 1 CAPSULE EVERY DAY 90 capsule 5  . guaiFENesin (MUCINEX) 600 MG 12 hr tablet Take 1 tablet (600 mg total) by mouth every 12 (twelve) hours as needed for congestion.    Marland Kitchen HUMALOG MIX 75/25 KWIKPEN (75-25) 100 UNIT/ML SUPN Inject 25 units in the morning and 55 units at night.    Marland Kitchen oxyCODONE-acetaminophen (PERCOCET/ROXICET) 5-325 MG per tablet Take 1 tablet by mouth every 4 (four) hours as needed for pain. For  pain 40 tablet 0  . PROAIR HFA 108 (90 BASE) MCG/ACT inhaler as needed.    . promethazine (PHENERGAN) 25 MG tablet as needed.    . rosuvastatin (CRESTOR) 20 MG tablet Take 20 mg by mouth daily.    Marland Kitchen zolpidem (AMBIEN CR) 12.5 MG CR tablet Take 1 tablet by mouth daily as needed.     No current facility-administered medications for this visit.     LABS/IMAGING: No results found for this or any previous visit (from the past 48 hour(s)). No results found.  VITALS: BP 136/72   Pulse 76   Ht 5\' 11"  (1.803 m)   Wt 176 lb 12.8 oz (80.2 kg)   BMI 24.66 kg/m   EXAM: General appearance: alert and no distress Neck: no JVD and supple, symmetrical, trachea midline Lungs: clear to auscultation bilaterally Heart: regular rate and rhythm, S1, S2 normal, no murmur, click, rub or gallop Abdomen: soft, non-tender; bowel sounds normal; no masses,  no organomegaly Extremities: edema 1+ bilaterally, bilateral leg muscle atrophy Pulses: 2+ and symmetric Skin: Skin color, texture, turgor normal. No rashes or lesions Neurologic: Grossly normal Psych: Mildy irritable  EKG: Normal sinus rhythm at 76  ASSESSMENT: 1. Coronary disease status post four-vessel CABG in 2014 2. 60 pack year tobacco user - cut back to 1/2 ppd 3. Insulin-dependent diabetes 4. Dyslipidemia 5. Significant PAD status post bilateral SFA intervention 6. Lower extremity edema  PLAN: 1.   [name] is asymptomatic and denies any symptoms of coronary ischemia. He also has no evidence of recurrent PAD. He does have evidence on the diabetes and A1c was over 8 despite being on insulin. He may need an increase in medications and I would strongly consider adding an SGLT2 inhibitor given their demonstrated cardiovascular mortality benefit. I'll defer to his PCP regarding this. He will also get a significant coronary benefit from smoking cessation and I offered smoking cessation adjuvant to him today but he declined. He's continued to  continue to work on this. He was recently started on rosuvastatin 20 which I would agree with as he has indications for highly potent statin therapy. He should have a recheck lipid profile in 3 months and further medication titration to a goal LDL cholesterol of less than 70.  Follow-up annually or sooner as necessary.  [name], MD, [facility] Attending Cardiologist [place] 10/04/2016, 2:16 PM

## 2016-10-04 NOTE — Patient Instructions (Signed)
Your physician wants you to follow-up in: ONE YEAR with Dr. Hilty. You will receive a reminder letter in the mail two months in advance. If you don't receive a letter, please call our office to schedule the follow-up appointment.  

## 2016-11-26 ENCOUNTER — Other Ambulatory Visit (HOSPITAL_COMMUNITY): Payer: Self-pay | Admitting: Pulmonary Disease

## 2016-11-26 DIAGNOSIS — M542 Cervicalgia: Secondary | ICD-10-CM | POA: Diagnosis not present

## 2016-11-26 DIAGNOSIS — I2510 Atherosclerotic heart disease of native coronary artery without angina pectoris: Secondary | ICD-10-CM | POA: Diagnosis not present

## 2016-11-26 DIAGNOSIS — M545 Low back pain: Secondary | ICD-10-CM | POA: Diagnosis not present

## 2016-11-26 DIAGNOSIS — I739 Peripheral vascular disease, unspecified: Secondary | ICD-10-CM

## 2016-11-26 DIAGNOSIS — I10 Essential (primary) hypertension: Secondary | ICD-10-CM | POA: Diagnosis not present

## 2016-11-29 ENCOUNTER — Other Ambulatory Visit (HOSPITAL_COMMUNITY): Payer: Self-pay | Admitting: Pulmonary Disease

## 2016-11-29 DIAGNOSIS — I739 Peripheral vascular disease, unspecified: Secondary | ICD-10-CM

## 2016-11-30 ENCOUNTER — Other Ambulatory Visit (HOSPITAL_COMMUNITY): Payer: Self-pay | Admitting: Pulmonary Disease

## 2016-11-30 ENCOUNTER — Ambulatory Visit (HOSPITAL_COMMUNITY)
Admission: RE | Admit: 2016-11-30 | Discharge: 2016-11-30 | Disposition: A | Discharge status: Home / Self Care | Payer: Medicare Other | Source: Ambulatory Visit | Attending: Pulmonary Disease | Admitting: Pulmonary Disease

## 2016-11-30 ENCOUNTER — Other Ambulatory Visit: Payer: Self-pay | Admitting: *Deleted

## 2016-11-30 DIAGNOSIS — M545 Low back pain: Secondary | ICD-10-CM | POA: Diagnosis not present

## 2016-11-30 DIAGNOSIS — I10 Essential (primary) hypertension: Secondary | ICD-10-CM | POA: Diagnosis not present

## 2016-11-30 DIAGNOSIS — I739 Peripheral vascular disease, unspecified: Secondary | ICD-10-CM

## 2016-11-30 DIAGNOSIS — I2510 Atherosclerotic heart disease of native coronary artery without angina pectoris: Secondary | ICD-10-CM | POA: Diagnosis not present

## 2016-11-30 DIAGNOSIS — M79606 Pain in leg, unspecified: Secondary | ICD-10-CM | POA: Diagnosis not present

## 2016-11-30 DIAGNOSIS — M542 Cervicalgia: Secondary | ICD-10-CM | POA: Diagnosis not present

## 2017-02-25 DIAGNOSIS — J449 Chronic obstructive pulmonary disease, unspecified: Secondary | ICD-10-CM | POA: Diagnosis not present

## 2017-02-25 DIAGNOSIS — M542 Cervicalgia: Secondary | ICD-10-CM | POA: Diagnosis not present

## 2017-02-25 DIAGNOSIS — I2510 Atherosclerotic heart disease of native coronary artery without angina pectoris: Secondary | ICD-10-CM | POA: Diagnosis not present

## 2017-02-25 DIAGNOSIS — M545 Low back pain: Secondary | ICD-10-CM | POA: Diagnosis not present

## 2017-05-16 ENCOUNTER — Other Ambulatory Visit: Payer: Self-pay

## 2017-05-16 ENCOUNTER — Encounter: Payer: Self-pay | Admitting: Nurse Practitioner

## 2017-05-16 ENCOUNTER — Ambulatory Visit (INDEPENDENT_AMBULATORY_CARE_PROVIDER_SITE_OTHER): Payer: Medicare Other | Admitting: Nurse Practitioner

## 2017-05-16 ENCOUNTER — Telehealth: Payer: Self-pay

## 2017-05-16 VITALS — BP 154/90 | HR 72 | Temp 97.3°F | Ht 71.0 in | Wt 177.2 lb

## 2017-05-16 DIAGNOSIS — Z86010 Personal history of colonic polyps: Secondary | ICD-10-CM

## 2017-05-16 DIAGNOSIS — R109 Unspecified abdominal pain: Secondary | ICD-10-CM | POA: Insufficient documentation

## 2017-05-16 DIAGNOSIS — R634 Abnormal weight loss: Secondary | ICD-10-CM

## 2017-05-16 DIAGNOSIS — R1013 Epigastric pain: Secondary | ICD-10-CM

## 2017-05-16 MED ORDER — PANTOPRAZOLE SODIUM 40 MG PO TBEC: 40.0000 mg | DELAYED_RELEASE_TABLET | Freq: Every day | ORAL | 3 refills | Status: DC

## 2017-05-16 MED ORDER — PEG 3350-KCL-NA BICARB-NACL 420 G PO SOLR: 4000.0000 mL | ORAL | 0 refills | Status: DC

## 2017-05-16 NOTE — Assessment & Plan Note (Signed)
Noted unintentional weight loss of about 25 pounds in the previous year or so. Also with epigastric abdominal pain and a history of multiple tubular adenoma colon polyps. He is also having GERD symptoms as per above with a history of gastritis and esophagitis on EGD. We will proceed with an upper and lower endoscopic evaluation at this time. Return for follow-up in 3 months.  Proceed with EGD and TCS on propofol/MAC with Dr. Gala Romney in near future: the risks, benefits, and alternatives have been discussed with the patient in detail. The patient states understanding and desires to proceed.  The patient is currently on oxycodone, Ambien. Denies alcohol and drug use. No other anticoagulants, anxiolytics, chronic pain medications, or antidepressants. We'll plan for the procedure on propofol/MAC to provide for adequate sedation.

## 2017-05-16 NOTE — Progress Notes (Signed)
CC'ED TO PCP 

## 2017-05-16 NOTE — Progress Notes (Signed)
Primary Care Physician:  Sinda Du, MD Primary Gastroenterologist:  Dr. Gala Romney  Chief Complaint  Patient presents with  . Abdominal Pain    x 2 months about 3-4 times per week    HPI:   [name] is a 56 y.o. male who presents For evaluation of abdominal pain. Last colonoscopy 08/28/2011 which was first-ever screening colonoscopy. Findings included suboptimal prep with multiple diminutive rectal polyps, 2 ascending colon polyps one of which was diminutive and one was 6 mm. Remainder of colonic mucosa normal. EGD completed the same day found erosive reflux esophagitis with minimal gastric erosions, suspect varying degrees of abnormal gastric emptying periodically due to markedly fluctuating blood sugars. Recommended start PPI, that her control of blood sugars.  Labs reviewed and surgical pathology of his polyps found to be tubular adenoma. Recommended 5 year repeat exam. He is currently due.  Today he states he's been having abdominal pain for 2 months. Pain is epigastric, sharp, intermittent, no known triggers, no attempted alleviating factors. Denies N/V, esophageal burning, bitter taste. Ibuprofen "every once in a while" but denies ASA powders. Denies hematochezia, melena, fever, chills. Has been losing weight over the past couple years (about 20-25 lbs in 2 years) without attempt. Denies dysphagia or odynophagia since EGD/dilaton "years ago." Denies chest pain, dyspnea, dizziness, lightheadedness, syncope, near syncope. Denies any other upper or lower GI symptoms.  Past Medical History:  Diagnosis Date  . Arthritis    "some; in hips sometimes" (01/02/2013)  . Back pain    "w/prolonged walking or standing" (01/02/2013)  . CAD (coronary artery disease)    CABG X 4 08/2012  . COPD (chronic obstructive pulmonary disease) ([place])   . Esophageal erosions 05/24/01   egd by Dr. Gala Romney  . GERD (gastroesophageal reflux disease)   . H/O hiatal hernia   . Hyperlipidemia   . Hypertension    . PAD (peripheral artery disease) ([place])    LEA DOPPLER, 11/11/2012 - moderate arterial insufficiency to lower extremities at rest, BILATERAL SFA-demonstrates occlusive disease with reconstitution of flow  . Tobacco abuse   . Tubular adenoma of colon 08/28/11  . Type II diabetes mellitus ([place])   . Unintentional weight loss     Past Surgical History:  Procedure Laterality Date  . ANTERIOR CERVICAL DECOMP/DISCECTOMY FUSION  12/19/2011   Procedure: ANTERIOR CERVICAL DECOMPRESSION/DISCECTOMY FUSION 2 LEVELS;  Surgeon: Floyce Stakes, MD;  Location: [facility];  Service: Neurosurgery;  Laterality: N/A;  Cervical four-five Cervical five-six  Anterior cervical decompression/diskectomy, fusion, plate  . ATHERECTOMY N/A 01/02/2013   Procedure: ATHERECTOMY;  Surgeon: Lorretta Harp, MD;  Location: [facility] CATH LAB;  Service: Cardiovascular;  Laterality: N/A;  . BACK SURGERY    . CARDIAC CATHETERIZATION  08/19/2012   CABG warranted; Severe ostial LCx stenosis with mid to distal vessel occlusion  . COLONOSCOPY W/ POLYPECTOMY  08/28/11   Rourk-tubular adenomas removed from ascending colon, suboptimal prep, diminutive rectal polyps  . CORONARY ARTERY BYPASS GRAFT  08/22/2012   Procedure: CORONARY ARTERY BYPASS GRAFTING (CABG);  Surgeon: Grace Isaac, MD;  Location: [place];  Service: Open Heart Surgery;  Laterality: N/A;  CABG x four,  using left internal mammary artery and right leg greater saphenous vein harvested endoscopically  . ESOPHAGOGASTRODUODENOSCOPY  05/24/01   by Dr. Gala Romney  . ESOPHAGOGASTRODUODENOSCOPY  08/28/11   Rourk-erosive reflux esophagitis, gastric erosions  . INTRAOPERATIVE TRANSESOPHAGEAL ECHOCARDIOGRAM  08/22/2012   Procedure: INTRAOPERATIVE TRANSESOPHAGEAL ECHOCARDIOGRAM;  Surgeon: Grace Isaac, MD;  Location: [facility];  Service: Open Heart Surgery;  Laterality: N/A;  . LEFT HEART CATHETERIZATION WITH CORONARY ANGIOGRAM N/A 08/19/2012   Procedure: LEFT HEART CATHETERIZATION WITH  CORONARY ANGIOGRAM;  Surgeon: [name], MD;  Location[facility] CATH LAB;  Service: Cardiovascular;  Laterality: N/A;  . LOWER EXTREMITY ANGIOGRAM N/A 11/17/2012   Procedure: LOWER EXTREMITY ANGIOGRAM;  Surgeon: Lorretta Harp, MD;  Location: [facility] CATH LAB;  Service: Cardiovascular;  Laterality: N/A;  . LOWER EXTREMITY ANGIOGRAM N/A 02/02/2013   Procedure: LOWER EXTREMITY ANGIOGRAM;  Surgeon: Lorretta Harp, MD;  Location: [facility] CATH LAB;  Service: Cardiovascular;  Laterality: N/A;  . LUMBAR [place]    . LUMBAR FUSION  ~ 2011  . PELVIC ABCESS DRAINAGE    . SHOULDER SURGERY Right   . VASCULAR SURGERY Right 01/02/2013   diamondback orbital rotational  atherectomy, chocolate balloon and IDEV  Stent.    Current Outpatient Prescriptions  Medication Sig Dispense Refill  . clopidogrel (PLAVIX) 75 MG tablet Take 1 tablet (75 mg total) by mouth daily with breakfast. 30 tablet 11  . oxyCODONE-acetaminophen (PERCOCET/ROXICET) 5-325 MG per tablet Take 1 tablet by mouth every 4 (four) hours as needed for pain. For pain (Patient taking differently: Take 1 tablet by mouth QID. For pain) 40 tablet 0  . PROAIR HFA 108 (90 BASE) MCG/ACT inhaler as needed.    . rosuvastatin (CRESTOR) 20 MG tablet Take 20 mg by mouth daily.    Marland Kitchen zolpidem (AMBIEN CR) 12.5 MG CR tablet Take 1 tablet by mouth daily as needed.     No current facility-administered medications for this visit.     Allergies as of 05/16/2017  . (No Known Allergies)    Family History  Problem Relation Age of Onset  . Anesthesia problems Neg Hx   . Colon cancer Neg Hx   . Gastric cancer Neg Hx   . Esophageal cancer Neg Hx     Social History   Social History  . Marital status: Single    Spouse name: N/A  . Number of children: 1  . Years of education: N/A   Occupational History  . disabled Disabled   Social History Main Topics  . Smoking status: Current Every Day Smoker    Packs/day: 1.50    Years: 39.00    Types: Cigarettes    . Smokeless tobacco: Never Used  . Alcohol use No  . Drug use: No  . Sexual activity: Not Currently   Other Topics Concern  . Not on file   Social History Narrative   Lives w/ girlfriend    Review of Systems: General: Negative for anorexia, weight loss, fever, chills, fatigue, weakness. ENT: Negative for hoarseness, difficulty swallowing. CV: Negative for chest pain, angina, palpitations, peripheral edema.  Respiratory: Negative for dyspnea at rest, cough, sputum, wheezing.  GI: See history of present illness. MS: Negative for joint pain, low back pain.  Derm: Negative for rash or itching.  Endo: Negative for unusual weight change.  Heme: Negative for bruising or bleeding. Allergy: Negative for rash or hives.    Physical Exam: BP (!) 154/90   Pulse 72   Temp (!) 97.3 F (36.3 C) (Oral)   Ht 5\' 11"  (1.803 m)   Wt 177 lb 3.2 oz (80.4 kg)   BMI 24.71 kg/m  General:   Alert and oriented. Pleasant and cooperative. Well-nourished and well-developed.  Head:  Normocephalic and atraumatic. Eyes:  Without icterus, sclera clear and conjunctiva pink.  Ears:  Normal auditory acuity. Cardiovascular:  S1, S2 present without murmurs appreciated. Extremities without clubbing or edema. Respiratory:  Clear to auscultation bilaterally. No wheezes, rales, or rhonchi. No distress.  Gastrointestinal:  +BS, soft,and non-distended. Epigastric TTP noted. No HSM noted. No guarding or rebound. No masses appreciated.  Rectal:  Deferred  Musculoskalatal:  Symmetrical without gross deformities. Skin:  Intact without significant lesions or rashes. Neurologic:  Alert and oriented x4;  grossly normal neurologically. Psych:  Alert and cooperative. Normal mood and affect. Heme/Lymph/Immune: No excessive bruising noted.    05/16/2017 8:33 AM   Disclaimer: This note was dictated with voice recognition software. Similar sounding words can inadvertently be transcribed and may not be corrected upon  review.

## 2017-05-16 NOTE — Patient Instructions (Signed)
1. I have sent in Protonix to your pharmacy. Take 40 mg pill first thing in the morning, before eating. 2. We will schedule your upper endoscopy and colonoscopy for you. 3. Return for follow-up in 3 months. 4. Call us if you have any questions or concerns.

## 2017-05-16 NOTE — H&P (View-Only) (Signed)
Primary Care Physician:  Sinda Du, MD Primary Gastroenterologist:  Dr. Gala Romney  Chief Complaint  Patient presents with  . Abdominal Pain    x 2 months about 3-4 times per week    HPI:   [name] is a 56 y.o. male who presents For evaluation of abdominal pain. Last colonoscopy 08/28/2011 which was first-ever screening colonoscopy. Findings included suboptimal prep with multiple diminutive rectal polyps, 2 ascending colon polyps one of which was diminutive and one was 6 mm. Remainder of colonic mucosa normal. EGD completed the same day found erosive reflux esophagitis with minimal gastric erosions, suspect varying degrees of abnormal gastric emptying periodically due to markedly fluctuating blood sugars. Recommended start PPI, that her control of blood sugars.  Labs reviewed and surgical pathology of his polyps found to be tubular adenoma. Recommended 5 year repeat exam. He is currently due.  Today he states he's been having abdominal pain for 2 months. Pain is epigastric, sharp, intermittent, no known triggers, no attempted alleviating factors. Denies N/V, esophageal burning, bitter taste. Ibuprofen "every once in a while" but denies ASA powders. Denies hematochezia, melena, fever, chills. Has been losing weight over the past couple years (about 20-25 lbs in 2 years) without attempt. Denies dysphagia or odynophagia since EGD/dilaton "years ago." Denies chest pain, dyspnea, dizziness, lightheadedness, syncope, near syncope. Denies any other upper or lower GI symptoms.  Past Medical History:  Diagnosis Date  . Arthritis    "some; in hips sometimes" (01/02/2013)  . Back pain    "w/prolonged walking or standing" (01/02/2013)  . CAD (coronary artery disease)    CABG X 4 08/2012  . COPD (chronic obstructive pulmonary disease) ([place])   . Esophageal erosions 05/24/01   egd by Dr. Gala Romney  . GERD (gastroesophageal reflux disease)   . H/O hiatal hernia   . Hyperlipidemia   . Hypertension    . PAD (peripheral artery disease) ([place])    LEA DOPPLER, 11/11/2012 - moderate arterial insufficiency to lower extremities at rest, BILATERAL SFA-demonstrates occlusive disease with reconstitution of flow  . Tobacco abuse   . Tubular adenoma of colon 08/28/11  . Type II diabetes mellitus ([place])   . Unintentional weight loss     Past Surgical History:  Procedure Laterality Date  . ANTERIOR CERVICAL DECOMP/DISCECTOMY FUSION  12/19/2011   Procedure: ANTERIOR CERVICAL DECOMPRESSION/DISCECTOMY FUSION 2 LEVELS;  Surgeon: Floyce Stakes, MD;  Location: [facility];  Service: Neurosurgery;  Laterality: N/A;  Cervical four-five Cervical five-six  Anterior cervical decompression/diskectomy, fusion, plate  . ATHERECTOMY N/A 01/02/2013   Procedure: ATHERECTOMY;  Surgeon: Lorretta Harp, MD;  Location: [facility] CATH LAB;  Service: Cardiovascular;  Laterality: N/A;  . BACK SURGERY    . CARDIAC CATHETERIZATION  08/19/2012   CABG warranted; Severe ostial LCx stenosis with mid to distal vessel occlusion  . COLONOSCOPY W/ POLYPECTOMY  08/28/11   Rourk-tubular adenomas removed from ascending colon, suboptimal prep, diminutive rectal polyps  . CORONARY ARTERY BYPASS GRAFT  08/22/2012   Procedure: CORONARY ARTERY BYPASS GRAFTING (CABG);  Surgeon: Grace Isaac, MD;  Location: [place];  Service: Open Heart Surgery;  Laterality: N/A;  CABG x four,  using left internal mammary artery and right leg greater saphenous vein harvested endoscopically  . ESOPHAGOGASTRODUODENOSCOPY  05/24/01   by Dr. Gala Romney  . ESOPHAGOGASTRODUODENOSCOPY  08/28/11   Rourk-erosive reflux esophagitis, gastric erosions  . INTRAOPERATIVE TRANSESOPHAGEAL ECHOCARDIOGRAM  08/22/2012   Procedure: INTRAOPERATIVE TRANSESOPHAGEAL ECHOCARDIOGRAM;  Surgeon: Grace Isaac, MD;  Location: [facility];  Service: Open Heart Surgery;  Laterality: N/A;  . LEFT HEART CATHETERIZATION WITH CORONARY ANGIOGRAM N/A 08/19/2012   Procedure: LEFT HEART CATHETERIZATION WITH  CORONARY ANGIOGRAM;  Surgeon: [name], MD;  Location: [facility] CATH LAB;  Service: Cardiovascular;  Laterality: N/A;  . LOWER EXTREMITY ANGIOGRAM N/A 11/17/2012   Procedure: LOWER EXTREMITY ANGIOGRAM;  Surgeon: Lorretta Harp, MD;  Location: [facility] CATH LAB;  Service: Cardiovascular;  Laterality: N/A;  . LOWER EXTREMITY ANGIOGRAM N/A 02/02/2013   Procedure: LOWER EXTREMITY ANGIOGRAM;  Surgeon: Lorretta Harp, MD;  Location: [facility] CATH LAB;  Service: Cardiovascular;  Laterality: N/A;  . LUMBAR [place]    . LUMBAR FUSION  ~ 2011  . PELVIC ABCESS DRAINAGE    . SHOULDER SURGERY Right   . VASCULAR SURGERY Right 01/02/2013   diamondback orbital rotational  atherectomy, chocolate balloon and IDEV  Stent.    Current Outpatient Prescriptions  Medication Sig Dispense Refill  . clopidogrel (PLAVIX) 75 MG tablet Take 1 tablet (75 mg total) by mouth daily with breakfast. 30 tablet 11  . oxyCODONE-acetaminophen (PERCOCET/ROXICET) 5-325 MG per tablet Take 1 tablet by mouth every 4 (four) hours as needed for pain. For pain (Patient taking differently: Take 1 tablet by mouth QID. For pain) 40 tablet 0  . PROAIR HFA 108 (90 BASE) MCG/ACT inhaler as needed.    . rosuvastatin (CRESTOR) 20 MG tablet Take 20 mg by mouth daily.    Marland Kitchen zolpidem (AMBIEN CR) 12.5 MG CR tablet Take 1 tablet by mouth daily as needed.     No current facility-administered medications for this visit.     Allergies as of 05/16/2017  . (No Known Allergies)    Family History  Problem Relation Age of Onset  . Anesthesia problems Neg Hx   . Colon cancer Neg Hx   . Gastric cancer Neg Hx   . Esophageal cancer Neg Hx     Social History   Social History  . Marital status: Single    Spouse name: N/A  . Number of children: 1  . Years of education: N/A   Occupational History  . disabled Disabled   Social History Main Topics  . Smoking status: Current Every Day Smoker    Packs/day: 1.50    Years: 39.00    Types: Cigarettes    . Smokeless tobacco: Never Used  . Alcohol use No  . Drug use: No  . Sexual activity: Not Currently   Other Topics Concern  . Not on file   Social History Narrative   Lives w/ girlfriend    Review of Systems: General: Negative for anorexia, weight loss, fever, chills, fatigue, weakness. ENT: Negative for hoarseness, difficulty swallowing. CV: Negative for chest pain, angina, palpitations, peripheral edema.  Respiratory: Negative for dyspnea at rest, cough, sputum, wheezing.  GI: See history of present illness. MS: Negative for joint pain, low back pain.  Derm: Negative for rash or itching.  Endo: Negative for unusual weight change.  Heme: Negative for bruising or bleeding. Allergy: Negative for rash or hives.    Physical Exam: BP (!) 154/90   Pulse 72   Temp (!) 97.3 F (36.3 C) (Oral)   Ht 5\' 11"  (1.803 m)   Wt 177 lb 3.2 oz (80.4 kg)   BMI 24.71 kg/m  General:   Alert and oriented. Pleasant and cooperative. Well-nourished and well-developed.  Head:  Normocephalic and atraumatic. Eyes:  Without icterus, sclera clear and conjunctiva pink.  Ears:  Normal auditory acuity. Cardiovascular:  S1, S2 present without murmurs appreciated. Extremities without clubbing or edema. Respiratory:  Clear to auscultation bilaterally. No wheezes, rales, or rhonchi. No distress.  Gastrointestinal:  +BS, soft,and non-distended. Epigastric TTP noted. No HSM noted. No guarding or rebound. No masses appreciated.  Rectal:  Deferred  Musculoskalatal:  Symmetrical without gross deformities. Skin:  Intact without significant lesions or rashes. Neurologic:  Alert and oriented x4;  grossly normal neurologically. Psych:  Alert and cooperative. Normal mood and affect. Heme/Lymph/Immune: No excessive bruising noted.    05/16/2017 8:33 AM   Disclaimer: This note was dictated with voice recognition software. Similar sounding words can inadvertently be transcribed and may not be corrected upon  review.

## 2017-05-16 NOTE — Assessment & Plan Note (Signed)
Noted epigastric abdominal pain which is intermittent and has been going on for a couple few months. His last upper endoscopy showed gastritis and esophagitis. He denies typical dyspepsia symptoms including no esophageal burning, no bitter/sour taste. Previously when he was recommended for PPI by our practice several years ago his wife states she did not take it because she said "I do not have acid." I discussed with him his previous EGD findings and the fact that not all heartburn symptoms present the same. He is agreed to take omeprazole 20 mg once a day and I sent this to his pharmacy. Further evaluation by upper endoscopy as per below given history of unintentional weight loss.

## 2017-05-16 NOTE — Assessment & Plan Note (Signed)
Last colonoscopy in 2013 found multiple colon polyps which were found to be tubular adenoma on surgical pathology. Recommended 5 year repeat exam in he is currently due. Additionally, he is having unintentional weight loss as per below. We will proceed with colonoscopy at this time for surveillance.

## 2017-05-16 NOTE — Telephone Encounter (Signed)
Called and informed pt of pre-op appt 06/07/17 a 11:00am. Letter mailed.

## 2017-05-28 DIAGNOSIS — Z0000 Encounter for general adult medical examination without abnormal findings: Secondary | ICD-10-CM | POA: Diagnosis not present

## 2017-05-28 DIAGNOSIS — Z23 Encounter for immunization: Secondary | ICD-10-CM | POA: Diagnosis not present

## 2017-06-04 NOTE — Patient Instructions (Addendum)
[name]  06/04/2017     @PREFPERIOPPHARMACY @   Your procedure is scheduled on 06/23/2017.  Report to [name] at 7:45 A.M.  Call this number if you have problems the morning of surgery:  [phone]   Remember:  Do not eat food or drink liquids after midnight.  Take these medicines the morning of surgery with A SIP OF WATER Oxycodone if needed, Protonix, Pro Air and bring with you   Do not wear jewelry, make-up or nail polish.  Do not wear lotions, powders, or perfumes, or deoderant.  Do not shave 48 hours prior to surgery.  Men may shave face and neck.  Do not bring valuables to the hospital.  [facility] is not responsible for any belongings or valuables.  Contacts, dentures or bridgework may not be worn into surgery.  Leave your suitcase in the car.  After surgery it may be brought to your room.  For patients admitted to the hospital, discharge time will be determined by your treatment team.  Patients discharged the day of surgery will not be allowed to drive home.    Please read over the following fact sheets that you were given. Anesthesia Post-op Instructions     PATIENT INSTRUCTIONS POST-ANESTHESIA  IMMEDIATELY FOLLOWING SURGERY:  Do not drive or operate machinery for the first twenty four hours after surgery.  Do not make any important decisions for twenty four hours after surgery or while taking narcotic pain medications or sedatives.  If you develop intractable nausea and vomiting or a severe headache please notify your doctor immediately.  FOLLOW-UP:  Please make an appointment with your surgeon as instructed. You do not need to follow up with anesthesia unless specifically instructed to do so.  WOUND CARE INSTRUCTIONS (if applicable):  Keep a dry clean dressing on the anesthesia/puncture wound site if there is drainage.  Once the wound has quit draining you may leave it open to air.  Generally you should leave the bandage intact for twenty four hours  unless there is drainage.  If the epidural site drains for more than 36-48 hours please call the anesthesia department.  QUESTIONS?:  Please feel free to call your physician or the hospital operator if you have any questions, and they will be happy to assist you.       Colonoscopy, Adult A colonoscopy is an exam to look at the entire large intestine. During the exam, a lubricated, bendable tube is inserted into the anus and then passed into the rectum, colon, and other parts of the large intestine. A colonoscopy is often done as a part of normal colorectal screening or in response to certain symptoms, such as anemia, persistent diarrhea, abdominal pain, and blood in the stool. The exam can help screen for and diagnose medical problems, including:  Tumors.  Polyps.  Inflammation.  Areas of bleeding.  Tell a health care provider about:  Any allergies you have.  All medicines you are taking, including vitamins, herbs, eye drops, creams, and over-the-counter medicines.  Any problems you or family members have had with anesthetic medicines.  Any blood disorders you have.  Any surgeries you have had.  Any medical conditions you have.  Any problems you have had passing stool. What are the risks? Generally, this is a safe procedure. However, problems may occur, including:  Bleeding.  A tear in the intestine.  A reaction to medicines given during the exam.  Infection (rare).  What happens before the procedure? Eating and  drinking restrictions Follow instructions from your health care provider about eating and drinking, which may include:  A few days before the procedure - follow a low-fiber diet. Avoid nuts, seeds, dried fruit, raw fruits, and vegetables.  1-3 days before the procedure - follow a clear liquid diet. Drink only clear liquids, such as clear broth or bouillon, black coffee or tea, clear juice, clear soft drinks or sports drinks, gelatin dessert, and popsicles.  Avoid any liquids that contain red or purple dye.  On the day of the procedure - do not eat or drink anything during the 2 hours before the procedure, or within the time period that your health care provider recommends.  Bowel prep If you were prescribed an oral bowel prep to clean out your colon:  Take it as told by your health care provider. Starting the day before your procedure, you will need to drink a large amount of medicated liquid. The liquid will cause you to have multiple loose stools until your stool is almost clear or light green.  If your skin or anus gets irritated from diarrhea, you may use these to relieve the irritation: ? Medicated wipes, such as adult wet wipes with aloe and vitamin E. ? A skin soothing-product like petroleum jelly.  If you vomit while drinking the bowel prep, take a break for up to 60 minutes and then begin the bowel prep again. If vomiting continues and you cannot take the bowel prep without vomiting, call your health care provider.  General instructions  Ask your health care provider about changing or stopping your regular medicines. This is especially important if you are taking diabetes medicines or blood thinners.  Plan to have someone take you home from the hospital or clinic. What happens during the procedure?  An IV tube may be inserted into one of your veins.  You will be given medicine to help you relax (sedative).  To reduce your risk of infection: ? Your health care team will wash or sanitize their hands. ? Your anal area will be washed with soap.  You will be asked to lie on your side with your knees bent.  Your health care provider will lubricate a long, thin, flexible tube. The tube will have a camera and a light on the end.  The tube will be inserted into your anus.  The tube will be gently eased through your rectum and colon.  Air will be delivered into your colon to keep it open. You may feel some pressure or  cramping.  The camera will be used to take images during the procedure.  A small tissue sample may be removed from your body to be examined under a microscope (biopsy). If any potential problems are found, the tissue will be sent to a lab for testing.  If small polyps are found, your health care provider may remove them and have them checked for cancer cells.  The tube that was inserted into your anus will be slowly removed. The procedure may vary among health care providers and hospitals. What happens after the procedure?  Your blood pressure, heart rate, breathing rate, and blood oxygen level will be monitored until the medicines you were given have worn off.  Do not drive for 24 hours after the exam.  You may have a small amount of blood in your stool.  You may pass gas and have mild abdominal cramping or bloating due to the air that was used to inflate your colon during the exam.  It is up to you to get the results of your procedure. Ask your health care provider, or the department performing the procedure, when your results will be ready. This information is not intended to replace advice given to you by your health care provider. Make sure you discuss any questions you have with your health care provider. Document Released: 07/20/2000 Document Revised: 05/23/2016 Document Reviewed: 10/04/2015 Elsevier Interactive Patient Education  2018 [employer].

## 2017-06-07 ENCOUNTER — Encounter (HOSPITAL_COMMUNITY): Payer: Self-pay

## 2017-06-07 ENCOUNTER — Encounter (HOSPITAL_COMMUNITY)
Admission: RE | Admit: 2017-06-07 | Discharge: 2017-06-07 | Disposition: A | Discharge status: Home / Self Care | Payer: Medicare Other | Source: Ambulatory Visit | Attending: Internal Medicine | Admitting: Internal Medicine

## 2017-06-07 DIAGNOSIS — E119 Type 2 diabetes mellitus without complications: Secondary | ICD-10-CM | POA: Diagnosis not present

## 2017-06-07 DIAGNOSIS — Z01812 Encounter for preprocedural laboratory examination: Secondary | ICD-10-CM | POA: Diagnosis not present

## 2017-06-07 HISTORY — DX: Personal history of other diseases of the musculoskeletal system and connective tissue: Z87.39

## 2017-06-07 LAB — CBC WITH DIFFERENTIAL/PLATELET
BASOS ABS: 0.1 10*3/uL (ref 0.0–0.1)
Basophils Relative: 1 %
EOS ABS: 0.2 10*3/uL (ref 0.0–0.7)
Eosinophils Relative: 2 %
HCT: 47.6 % (ref 39.0–52.0)
HEMOGLOBIN: 16.7 g/dL (ref 13.0–17.0)
LYMPHS ABS: 1.7 10*3/uL (ref 0.7–4.0)
Lymphocytes Relative: 14 %
MCH: 31.7 pg (ref 26.0–34.0)
MCHC: 35.1 g/dL (ref 30.0–36.0)
MCV: 90.3 fL (ref 78.0–100.0)
Monocytes Absolute: 0.8 10*3/uL (ref 0.1–1.0)
Monocytes Relative: 6 %
NEUTROS PCT: 77 %
Neutro Abs: 9.3 10*3/uL — ABNORMAL HIGH (ref 1.7–7.7)
PLATELETS: 167 10*3/uL (ref 150–400)
RBC: 5.27 MIL/uL (ref 4.22–5.81)
RDW: 11.9 % (ref 11.5–15.5)
WBC: 12 10*3/uL — AB (ref 4.0–10.5)

## 2017-06-07 LAB — BASIC METABOLIC PANEL
ANION GAP: 12 (ref 5–15)
BUN: 14 mg/dL (ref 6–20)
CHLORIDE: 98 mmol/L — AB (ref 101–111)
CO2: 25 mmol/L (ref 22–32)
Calcium: 9.9 mg/dL (ref 8.9–10.3)
Creatinine, Ser: 0.86 mg/dL (ref 0.61–1.24)
Glucose, Bld: 232 mg/dL — ABNORMAL HIGH (ref 65–99)
POTASSIUM: 4.1 mmol/L (ref 3.5–5.1)
SODIUM: 135 mmol/L (ref 135–145)

## 2017-06-07 LAB — GLUCOSE, CAPILLARY: GLUCOSE-CAPILLARY: 215 mg/dL — AB (ref 65–99)

## 2017-06-07 LAB — HEMOGLOBIN A1C
HEMOGLOBIN A1C: 9.4 % — AB (ref 4.8–5.6)
MEAN PLASMA GLUCOSE: 223.08 mg/dL

## 2017-06-07 NOTE — Progress Notes (Signed)
   06/07/17 1110  OBSTRUCTIVE SLEEP APNEA  Have you ever been diagnosed with sleep apnea through a sleep study? No  Do you snore loudly (loud enough to be heard through closed doors)?  1  Do you often feel tired, fatigued, or sleepy during the daytime (such as falling asleep during driving or talking to someone)? 0  Has anyone observed you stop breathing during your sleep? 1  Do you have, or are you being treated for high blood pressure? 1  BMI more than 35 kg/m2? 0  Age > 50 (1-yes) 1  Neck circumference greater than:Male 16 inches or larger, Male 17inches or larger? 0  Male Gender (Yes=1) 1  Obstructive Sleep Apnea Score 5  Score 5 or greater  Results sent to PCP

## 2017-06-10 NOTE — Pre-Procedure Instructions (Signed)
HgbA1C routed to PCP. 

## 2017-06-12 ENCOUNTER — Telehealth: Payer: Self-pay | Admitting: Internal Medicine

## 2017-06-12 NOTE — Telephone Encounter (Signed)
Pt's wife picked up Moviprep sample.

## 2017-06-12 NOTE — Telephone Encounter (Signed)
[phone] please call patient, procedure tomorrow morning and he stated he is throwing up his prep, stated last time they changed it to the 2 little bottle prep

## 2017-06-12 NOTE — Telephone Encounter (Signed)
Spoke to pt's wife. He is c/o abdominal pain since he took the Dulcolax tablets this afternoon. Started vomiting after drinking first glass of Tri-Lyte. He's having diarrhea. Per EG they can pick up Moviprep sample and instructions at the office.

## 2017-06-13 ENCOUNTER — Encounter (HOSPITAL_COMMUNITY): Payer: Self-pay

## 2017-06-13 ENCOUNTER — Ambulatory Visit (HOSPITAL_COMMUNITY)
Admission: RE | Admit: 2017-06-13 | Discharge: 2017-06-13 | Disposition: A | Discharge status: Home / Self Care | Payer: Medicare Other | Source: Ambulatory Visit | Attending: Internal Medicine | Admitting: Internal Medicine

## 2017-06-13 ENCOUNTER — Ambulatory Visit (HOSPITAL_COMMUNITY): Payer: Medicare Other | Admitting: Anesthesiology

## 2017-06-13 ENCOUNTER — Encounter (HOSPITAL_COMMUNITY)
Admission: RE | Disposition: A | Discharge status: Home / Self Care | Payer: Self-pay | Source: Ambulatory Visit | Attending: Internal Medicine

## 2017-06-13 DIAGNOSIS — Z981 Arthrodesis status: Secondary | ICD-10-CM | POA: Diagnosis not present

## 2017-06-13 DIAGNOSIS — E785 Hyperlipidemia, unspecified: Secondary | ICD-10-CM | POA: Diagnosis not present

## 2017-06-13 DIAGNOSIS — Z79899 Other long term (current) drug therapy: Secondary | ICD-10-CM | POA: Diagnosis not present

## 2017-06-13 DIAGNOSIS — R1013 Epigastric pain: Secondary | ICD-10-CM

## 2017-06-13 DIAGNOSIS — Z1211 Encounter for screening for malignant neoplasm of colon: Secondary | ICD-10-CM | POA: Insufficient documentation

## 2017-06-13 DIAGNOSIS — J449 Chronic obstructive pulmonary disease, unspecified: Secondary | ICD-10-CM | POA: Insufficient documentation

## 2017-06-13 DIAGNOSIS — K3189 Other diseases of stomach and duodenum: Secondary | ICD-10-CM

## 2017-06-13 DIAGNOSIS — R1012 Left upper quadrant pain: Secondary | ICD-10-CM | POA: Diagnosis not present

## 2017-06-13 DIAGNOSIS — K269 Duodenal ulcer, unspecified as acute or chronic, without hemorrhage or perforation: Secondary | ICD-10-CM | POA: Insufficient documentation

## 2017-06-13 DIAGNOSIS — Z951 Presence of aortocoronary bypass graft: Secondary | ICD-10-CM | POA: Diagnosis not present

## 2017-06-13 DIAGNOSIS — K640 First degree hemorrhoids: Secondary | ICD-10-CM | POA: Insufficient documentation

## 2017-06-13 DIAGNOSIS — I2510 Atherosclerotic heart disease of native coronary artery without angina pectoris: Secondary | ICD-10-CM | POA: Diagnosis not present

## 2017-06-13 DIAGNOSIS — I10 Essential (primary) hypertension: Secondary | ICD-10-CM | POA: Insufficient documentation

## 2017-06-13 DIAGNOSIS — R634 Abnormal weight loss: Secondary | ICD-10-CM | POA: Diagnosis not present

## 2017-06-13 DIAGNOSIS — M160 Bilateral primary osteoarthritis of hip: Secondary | ICD-10-CM | POA: Insufficient documentation

## 2017-06-13 DIAGNOSIS — Z7902 Long term (current) use of antithrombotics/antiplatelets: Secondary | ICD-10-CM | POA: Diagnosis not present

## 2017-06-13 DIAGNOSIS — E1151 Type 2 diabetes mellitus with diabetic peripheral angiopathy without gangrene: Secondary | ICD-10-CM | POA: Insufficient documentation

## 2017-06-13 DIAGNOSIS — K635 Polyp of colon: Secondary | ICD-10-CM | POA: Diagnosis not present

## 2017-06-13 DIAGNOSIS — K219 Gastro-esophageal reflux disease without esophagitis: Secondary | ICD-10-CM | POA: Insufficient documentation

## 2017-06-13 DIAGNOSIS — F17210 Nicotine dependence, cigarettes, uncomplicated: Secondary | ICD-10-CM | POA: Insufficient documentation

## 2017-06-13 DIAGNOSIS — D125 Benign neoplasm of sigmoid colon: Secondary | ICD-10-CM | POA: Diagnosis not present

## 2017-06-13 DIAGNOSIS — Z86010 Personal history of colonic polyps: Secondary | ICD-10-CM | POA: Insufficient documentation

## 2017-06-13 HISTORY — PX: POLYPECTOMY: SHX5525

## 2017-06-13 HISTORY — PX: COLONOSCOPY WITH PROPOFOL: SHX5780

## 2017-06-13 HISTORY — PX: ESOPHAGOGASTRODUODENOSCOPY (EGD) WITH PROPOFOL: SHX5813

## 2017-06-13 LAB — GLUCOSE, CAPILLARY
GLUCOSE-CAPILLARY: 212 mg/dL — AB (ref 65–99)
GLUCOSE-CAPILLARY: 211 mg/dL — AB (ref 65–99)

## 2017-06-13 SURGERY — COLONOSCOPY WITH PROPOFOL
Anesthesia: Monitor Anesthesia Care

## 2017-06-13 MED ORDER — FENTANYL CITRATE (PF) 100 MCG/2ML IJ SOLN
25.0000 ug | Freq: Once | INTRAMUSCULAR | Status: AC
  Administered 2017-06-13: 25 ug via INTRAVENOUS

## 2017-06-13 MED ORDER — FENTANYL CITRATE (PF) 100 MCG/2ML IJ SOLN
INTRAMUSCULAR | Status: AC
  Filled 2017-06-13: qty 2

## 2017-06-13 MED ORDER — PROPOFOL 500 MG/50ML IV EMUL
INTRAVENOUS | Status: DC | PRN
  Administered 2017-06-13: 150 ug/kg/min via INTRAVENOUS
  Administered 2017-06-13: 09:00:00 via INTRAVENOUS

## 2017-06-13 MED ORDER — LIDOCAINE VISCOUS 2 % MT SOLN
15.0000 mL | Freq: Once | OROMUCOSAL | Status: AC
  Administered 2017-06-13: 3 mL via OROMUCOSAL

## 2017-06-13 MED ORDER — LACTATED RINGERS IV SOLN
INTRAVENOUS | Status: DC
  Administered 2017-06-13: 09:00:00 via INTRAVENOUS

## 2017-06-13 MED ORDER — PROPOFOL 10 MG/ML IV BOLUS
INTRAVENOUS | Status: AC
  Filled 2017-06-13: qty 60

## 2017-06-13 MED ORDER — MIDAZOLAM HCL 2 MG/2ML IJ SOLN
1.0000 mg | INTRAMUSCULAR | Status: AC
  Administered 2017-06-13: 2 mg via INTRAVENOUS

## 2017-06-13 MED ORDER — LIDOCAINE VISCOUS 2 % MT SOLN
OROMUCOSAL | Status: AC
  Filled 2017-06-13: qty 15

## 2017-06-13 MED ORDER — CHLORHEXIDINE GLUCONATE CLOTH 2 % EX PADS: 6.0000 | MEDICATED_PAD | Freq: Once | CUTANEOUS | Status: DC

## 2017-06-13 MED ORDER — LIDOCAINE HCL (PF) 1 % IJ SOLN
INTRAMUSCULAR | Status: AC
  Filled 2017-06-13: qty 15

## 2017-06-13 MED ORDER — MIDAZOLAM HCL 2 MG/2ML IJ SOLN
INTRAMUSCULAR | Status: AC
  Filled 2017-06-13: qty 2

## 2017-06-13 NOTE — Op Note (Signed)
[facility] Patient Name: [name] Procedure Date: 06/13/2017 8:30 AM MRN: [id number] Date of Birth: [birth year] Attending MD: Norvel Richards , MD CSN: [id number] Age: 56 Admit Type: Outpatient Procedure:                Upper GI endoscopy Indications:              Abdominal pain in the left upper quadrant, Weight                            loss ( patient states abdominal pain has resolved                            over the past couple of weeks) Providers:                Norvel Richards, MD, Otis Peak B. Sharon Seller, RN,                            Aram Candela Referring MD:              Medicines:                Propofol per Anesthesia Complications:            No immediate complications. Estimated Blood Loss:     Estimated blood loss: none. Procedure:                Pre-Anesthesia Assessment:                           - Prior to the procedure, a History and Physical                            was performed, and patient medications and                            allergies were reviewed. The patient's tolerance of                            previous anesthesia was also reviewed. The risks                            and benefits of the procedure and the sedation                            options and risks were discussed with the patient.                            All questions were answered, and informed consent                            was obtained. Prior Anticoagulants: The patient has                            taken no previous anticoagulant or antiplatelet  agents. ASA Grade Assessment: II - A patient with                            mild systemic disease. After reviewing the risks                            and benefits, the patient was deemed in                            satisfactory condition to undergo the procedure.                           After obtaining informed consent, the endoscope was                            passed under  direct vision. Throughout the                            procedure, the patient's blood pressure, pulse, and                            oxygen saturations were monitored continuously. The                            [id number]) scope was introduced through the                            mouth, and advanced to the second part of duodenum.                            The upper GI endoscopy was accomplished without                            difficulty. Scope In: 8:57:22 AM Scope Out: 9:01:05 AM Total Procedure Duration: 0 hours 3 minutes 43 seconds  Findings:      The examined esophagus was normal.      The entire examined stomach was normal.      Mucosal changes were found in the duodenal bulb. couple of tiny erosions       present only Impression:               - Normal esophagus.                           - Normal stomach.                           - Mucosal changes in the duodenum. Tiny Bulbar                            Erosions Likely Not Clinically Significant                           - No specimens collected. Moderate Sedation:      Moderate (conscious) sedation was personally administered by an  anesthesia professional. The following parameters were monitored: oxygen       saturation, heart rate, blood pressure, respiratory rate, EKG, adequacy       of pulmonary ventilation, and response to care. Total physician       intraservice time was 10 minutes. Recommendation:           - Patient has a contact number available for                            emergencies. The signs and symptoms of potential                            delayed complications were discussed with the                            patient. Return to normal activities tomorrow.                            Written discharge instructions were provided to the                            patient.                           - Resume previous diet.                           - Continue present medications.                            - No repeat upper endoscopy.                           - Return to GI office in 2 months. see colonoscopy                            report. See colonoscopy report. Procedure Code(s):        --- Professional ---                           [phone], Esophagogastroduodenoscopy, flexible,                            transoral; diagnostic, including collection of                            specimen(s) by brushing or washing, when performed                            (separate procedure) Diagnosis Code(s):        --- Professional ---                           K31.89, Other diseases of stomach and duodenum                           R10.12, Left upper quadrant  pain                           R63.4, Abnormal weight loss CPT copyright 2016 American Medical Association. All rights reserved. The codes documented in this report are preliminary and upon coder review may  be revised to meet current compliance requirements. Cristopher Estimable. Kacy Conely, MD Norvel Richards, MD 06/13/2017 9:19:59 AM This report has been signed electronically. Number of Addenda: 0

## 2017-06-13 NOTE — Anesthesia Procedure Notes (Signed)
Procedure Name: MAC Date/Time: 06/13/2017 8:49 AM Performed by: [name] Neyland Pettengill A, CRNA Pre-anesthesia Checklist: Patient identified, Emergency Drugs available, Suction available, Patient being monitored and Timeout performed Oxygen Delivery Method: Simple face mask

## 2017-06-13 NOTE — Transfer of Care (Signed)
Immediate Anesthesia Transfer of Care Note  Patient: [name]  Procedure(s) Performed: COLONOSCOPY WITH PROPOFOL (N/A ) ESOPHAGOGASTRODUODENOSCOPY (EGD) WITH PROPOFOL (N/A ) POLYPECTOMY  Patient Location: PACU  Anesthesia Type:MAC  Level of Consciousness: awake, alert , oriented and patient cooperative  Airway & Oxygen Therapy: Patient Spontanous Breathing  Post-op Assessment: Report given to RN and Post -op Vital signs reviewed and stable  Post vital signs: Reviewed and stable  Last Vitals:  Vitals:   06/13/17 0840 06/13/17 0845  BP: (!) 157/81 (!) 171/102  Pulse:    Resp: 17 14  Temp:    SpO2: 93% 95%    Last Pain:  Vitals:   06/13/17 0814  TempSrc: Oral      Patients Stated Pain Goal: 6 (66/06/00 4599)  Complications: No apparent anesthesia complications

## 2017-06-13 NOTE — Op Note (Signed)
[facility] Patient Name: [name] Procedure Date: 06/13/2017 8:37 AM MRN: [id number] Date of Birth: 21-Mar-1961 Attending MD: Norvel Richards , MD CSN: [id number] Age: 56 Admit Type: Outpatient Procedure:                Colonoscopy Indications:              High risk colon cancer surveillance: Personal                            history of colonic polyps Providers:                Norvel Richards, MD, Otis Peak B. Sharon Seller, RN,                            Aram Candela Referring MD:              Medicines:                Propofol per Anesthesia Complications:            No immediate complications. Estimated Blood Loss:     Estimated blood loss was minimal. Procedure:                Pre-Anesthesia Assessment:                           - Prior to the procedure, a History and Physical                            was performed, and patient medications and                            allergies were reviewed. The patient's tolerance of                            previous anesthesia was also reviewed. The risks                            and benefits of the procedure and the sedation                            options and risks were discussed with the patient.                            All questions were answered, and informed consent                            was obtained. Prior Anticoagulants: The patient has                            taken no previous anticoagulant or antiplatelet                            agents. ASA Grade Assessment: II - A patient with  mild systemic disease. After reviewing the risks                            and benefits, the patient was deemed in                            satisfactory condition to undergo the procedure.                           After obtaining informed consent, the colonoscope                            was passed under direct vision. Throughout the                            procedure, the patient's blood  pressure, pulse, and                            oxygen saturations were monitored continuously. The                            [id number] ([id number]) scope was introduced through                            the anus and advanced to the the cecum, identified                            by appendiceal orifice and ileocecal valve. The                            colonoscopy was performed without difficulty. The                            patient tolerated the procedure well. The entire                            colon was well visualized. The quality of the bowel                            preparation was adequate. The ileocecal valve,                            appendiceal orifice, and rectum were photographed. Scope In: 9:07:48 AM Scope Out: 9:17:14 AM Scope Withdrawal Time: 0 hours 7 minutes 1 second  Total Procedure Duration: 0 hours 9 minutes 26 seconds  Findings:      The perianal and digital rectal examinations were normal.      A 5 mm polyp was found in the sigmoid colon. Multiple "mammalations" in       this area as well The polyp was semi-pedunculated. The polyp was removed       with a cold snare. Resection and retrieval were complete. Estimated       blood loss was minimal.      Internal hemorrhoids were found during retroflexion. . The hemorrhoids  were Grade I (internal hemorrhoids that do not prolapse).      The exam was otherwise without abnormality on direct and retroflexion       views. Impression:               - One 5 mm polyp in the sigmoid colon, removed with                            a cold snare. Resected and retrieved.                           - Internal hemorrhoids.                           - The examination was otherwise normal on direct                            and retroflexion views. Moderate Sedation:      Moderate (conscious) sedation was personally administered by an       anesthesia professional. The following parameters were monitored: oxygen        saturation, heart rate, blood pressure, respiratory rate, EKG, adequacy       of pulmonary ventilation, and response to care. Total physician       intraservice time was 26 minutes. Recommendation:           - Patient has a contact number available for                            emergencies. The signs and symptoms of potential                            delayed complications were discussed with the                            patient. Return to normal activities tomorrow.                            Written discharge instructions were provided to the                            patient.                           - Resume previous diet.                           - Continue present medications.                           - Await pathology results.                           - Repeat colonoscopy date to be determined after                            pending pathology results are reviewed for  surveillance based on pathology results.                           - Return to GI clinic in 2 months. Procedure Code(s):        --- Professional ---                           [phone], Colonoscopy, flexible; with removal of                            tumor(s), polyp(s), or other lesion(s) by snare                            technique Diagnosis Code(s):        --- Professional ---                           Z86.010, Personal history of colonic polyps                           D12.5, Benign neoplasm of sigmoid colon                           K64.0, First degree hemorrhoids CPT copyright 2016 American Medical Association. All rights reserved. The codes documented in this report are preliminary and upon coder review may  be revised to meet current compliance requirements. Cristopher Estimable. Kedric Bumgarner, MD Norvel Richards, MD 06/13/2017 9:23:52 AM This report has been signed electronically. Number of Addenda: 0

## 2017-06-13 NOTE — Anesthesia Preprocedure Evaluation (Signed)
Anesthesia Evaluation  Patient identified by MRN, date of birth, ID band Patient awake    Reviewed: Allergy & Precautions, NPO status , Patient's Chart, lab work & pertinent test results  Airway Mallampati: II  TM Distance: >3 FB     Dental  (+) Teeth Intact   Pulmonary COPD, Current Smoker,    breath sounds clear to auscultation       Cardiovascular hypertension, + CAD, + CABG and + Peripheral Vascular Disease   Rhythm:Regular Rate:Normal     Neuro/Psych ANTERIOR CERVICAL DECOMP/DISCECTOMY FUSION    GI/Hepatic hiatal hernia, GERD  ,  Endo/Other  diabetes, Poorly Controlled, Type 2, Insulin Dependent  Renal/GU      Musculoskeletal  (+) Arthritis ,   Abdominal   Peds  Hematology   Anesthesia Other Findings   Reproductive/Obstetrics                             Anesthesia Physical Anesthesia Plan  ASA: III  Anesthesia Plan: MAC   Post-op Pain Management:    Induction: Intravenous  PONV Risk Score and Plan:   Airway Management Planned: Simple Face Mask  Additional Equipment:   Intra-op Plan:   Post-operative Plan:   Informed Consent: I have reviewed the patients History and Physical, chart, labs and discussed the procedure including the risks, benefits and alternatives for the proposed anesthesia with the patient or authorized representative who has indicated his/her understanding and acceptance.     Plan Discussed with:   Anesthesia Plan Comments:         Anesthesia Quick Evaluation

## 2017-06-13 NOTE — Interval H&P Note (Signed)
History and Physical Interval Note:  06/13/2017 8:45 AM  [name]  has presented today for surgery, with the diagnosis of epigastric pain, weight loss, history polyps  The various methods of treatment have been discussed with the patient and family. After consideration of risks, benefits and other options for treatment, the patient has consented to  Procedure(s) with comments: COLONOSCOPY WITH PROPOFOL (N/A) - 9:15am ESOPHAGOGASTRODUODENOSCOPY (EGD) WITH PROPOFOL (N/A) as a surgical intervention .  The patient's history has been reviewed, patient examined, no change in status, stable for surgery.  I have reviewed the patient's chart and labs.  Questions were answered to the patient's satisfaction.     [name]  Patient states abdominal pain has resolved. No dysphagia. Diagnostic EGD and surveillance colonoscopy per plan.  The risks, benefits, limitations, imponderables and alternatives regarding both EGD and colonoscopy have been reviewed with the patient. Questions have been answered. All parties agreeable.

## 2017-06-13 NOTE — Anesthesia Postprocedure Evaluation (Signed)
Anesthesia Post Note  Patient: [name]  Procedure(s) Performed: COLONOSCOPY WITH PROPOFOL (N/A ) ESOPHAGOGASTRODUODENOSCOPY (EGD) WITH PROPOFOL (N/A ) POLYPECTOMY  Patient location during evaluation: PACU Anesthesia Type: MAC Level of consciousness: awake and alert, patient cooperative and oriented Pain management: pain level controlled Vital Signs Assessment: post-procedure vital signs reviewed and stable Respiratory status: spontaneous breathing and respiratory function stable Cardiovascular status: stable Postop Assessment: no apparent nausea or vomiting Anesthetic complications: no     Last Vitals:  Vitals:   06/13/17 0840 06/13/17 0845  BP: (!) 157/81 (!) 171/102  Pulse:    Resp: 17 14  Temp:    SpO2: 93% 95%    Last Pain:  Vitals:   06/13/17 0814  TempSrc: Oral                 Maryum Batterson A

## 2017-06-13 NOTE — Discharge Instructions (Addendum)
°Colonoscopy °Discharge Instructions ° °Read the instructions outlined below and refer to this sheet in the next few weeks. These discharge instructions provide you with general information on caring for yourself after you leave the hospital. Your doctor may also give you specific instructions. While your treatment has been planned according to the most current medical practices available, unavoidable complications occasionally occur. If you have any problems or questions after discharge, call Dr. Rourk at [phone]. °ACTIVITY °· You may resume your regular activity, but move at a slower pace for the next 24 hours.  °· Take frequent rest periods for the next 24 hours.  °· Walking will help get rid of the air and reduce the bloated feeling in your belly (abdomen).  °· No driving for 24 hours (because of the medicine (anesthesia) used during the test).   °· Do not sign any important legal documents or operate any machinery for 24 hours (because of the anesthesia used during the test).  °NUTRITION °· Drink plenty of fluids.  °· You may resume your normal diet as instructed by your doctor.  °· Begin with a light meal and progress to your normal diet. Heavy or fried foods are harder to digest and may make you feel sick to your stomach (nauseated).  °· Avoid alcoholic beverages for 24 hours or as instructed.  °MEDICATIONS °· You may resume your normal medications unless your doctor tells you otherwise.  °WHAT YOU CAN EXPECT TODAY °· Some feelings of bloating in the abdomen.  °· Passage of more gas than usual.  °· Spotting of blood in your stool or on the toilet paper.  °IF YOU HAD POLYPS REMOVED DURING THE COLONOSCOPY: °· No aspirin products for 7 days or as instructed.  °· No alcohol for 7 days or as instructed.  °· Eat a soft diet for the next 24 hours.  °FINDING OUT THE RESULTS OF YOUR TEST °Not all test results are available during your visit. If your test results are not back during the visit, make an appointment  with your caregiver to find out the results. Do not assume everything is normal if you have not heard from your caregiver or the medical facility. It is important for you to follow up on all of your test results.  °SEEK IMMEDIATE MEDICAL ATTENTION IF: °· You have more than a spotting of blood in your stool.  °· Your belly is swollen (abdominal distention).  °· You are nauseated or vomiting.  °· You have a temperature over 101.  °· You have abdominal pain or discomfort that is severe or gets worse throughout the day.  °EGD °Discharge instructions °Please read the instructions outlined below and refer to this sheet in the next few weeks. These discharge instructions provide you with general information on caring for yourself after you leave the hospital. Your doctor may also give you specific instructions. While your treatment has been planned according to the most current medical practices available, unavoidable complications occasionally occur. If you have any problems or questions after discharge, please call your doctor. °ACTIVITY °· You may resume your regular activity but move at a slower pace for the next 24 hours.  °· Take frequent rest periods for the next 24 hours.  °· Walking will help expel (get rid of) the air and reduce the bloated feeling in your abdomen.  °· No driving for 24 hours (because of the anesthesia (medicine) used during the test).  °· You may shower.  °· Do not sign any important   legal documents or operate any machinery for 24 hours (because of the anesthesia used during the test).  NUTRITION  Drink plenty of fluids.   You may resume your normal diet.   Begin with a light meal and progress to your normal diet.   Avoid alcoholic beverages for 24 hours or as instructed by your caregiver.  MEDICATIONS  You may resume your normal medications unless your caregiver tells you otherwise.  WHAT YOU CAN EXPECT TODAY  You may experience abdominal discomfort such as a feeling of fullness  or gas pains.  FOLLOW-UP  Your doctor will discuss the results of your test with you.  SEEK IMMEDIATE MEDICAL ATTENTION IF ANY OF THE FOLLOWING OCCUR:  Excessive nausea (feeling sick to your stomach) and/or vomiting.   Severe abdominal pain and distention (swelling).   Trouble swallowing.   Temperature over 101 F (37.8 C).  Rectal bleeding or vomiting of blood.  Colon Polyps Polyps are tissue growths inside the body. Polyps can grow in many places, including the large intestine (colon). A polyp may be a round bump or a mushroom-shaped growth. You could have one polyp or several. Most colon polyps are noncancerous (benign). However, some colon polyps can become cancerous over time. What are the causes? The exact cause of colon polyps is not known. What increases the risk? This condition is more likely to develop in people who: Have a family history of colon cancer or colon polyps. Are older than 32 or older than 45 if they are African American. Have inflammatory bowel disease, such as ulcerative colitis or Crohn disease. Are overweight. Smoke cigarettes. Do not get enough exercise. Drink too much alcohol. Eat a diet that is: High in fat and red meat. Low in fiber. Had childhood cancer that was treated with abdominal radiation.  What are the signs or symptoms? Most polyps do not cause symptoms. If you have symptoms, they may include: Blood coming from your rectum when having a bowel movement. Blood in your stool.The stool may look dark red or black. A change in bowel habits, such as constipation or diarrhea.  How is this diagnosed? This condition is diagnosed with a colonoscopy. This is a procedure that uses a lighted, flexible scope to look at the inside of your colon. How is this treated? Treatment for this condition involves removing any polyps that are found. Those polyps will then be tested for cancer. If cancer is found, your health care provider will talk to you  about options for colon cancer treatment. Follow these instructions at home: Diet Eat plenty of fiber, such as fruits, vegetables, and whole grains. Eat foods that are high in calcium and vitamin D, such as milk, cheese, yogurt, eggs, liver, fish, and broccoli. Limit foods high in fat, red meats, and processed meats, such as hot dogs, sausage, bacon, and lunch meats. Maintain a healthy weight, or lose weight if recommended by your health care provider. General instructions Do not smoke cigarettes. Do not drink alcohol excessively. Keep all follow-up visits as told by your health care provider. This is important. This includes keeping regularly scheduled colonoscopies. Talk to your health care provider about when you need a colonoscopy. Exercise every day or as told by your health care provider. Contact a health care provider if: You have new or worsening bleeding during a bowel movement. You have new or increased blood in your stool. You have a change in bowel habits. You unexpectedly lose weight. This information is not intended to replace advice  given to you by your health care provider. Make sure you discuss any questions you have with your health care provider. Document Released: 04/18/2004 Document Revised: 12/29/2015 Document Reviewed: 06/13/2015 Elsevier Interactive Patient Education  [employer].     Colon polyp information provided  Further recommendations to follow pending review of pathology report  Office visit with [place] in 2 months

## 2017-06-17 ENCOUNTER — Encounter: Payer: Self-pay | Admitting: Internal Medicine

## 2017-06-18 ENCOUNTER — Encounter (HOSPITAL_COMMUNITY): Payer: Self-pay | Admitting: Internal Medicine

## 2017-07-16 ENCOUNTER — Ambulatory Visit: Payer: Medicare Other | Admitting: Internal Medicine

## 2017-08-15 ENCOUNTER — Other Ambulatory Visit (HOSPITAL_COMMUNITY): Payer: Self-pay | Admitting: Pulmonary Disease

## 2017-08-15 DIAGNOSIS — M545 Low back pain: Secondary | ICD-10-CM

## 2017-08-20 ENCOUNTER — Telehealth: Payer: Self-pay | Admitting: Internal Medicine

## 2017-08-20 ENCOUNTER — Ambulatory Visit: Payer: Medicare Other | Admitting: Nurse Practitioner

## 2017-08-20 ENCOUNTER — Encounter: Payer: Self-pay | Admitting: Nurse Practitioner

## 2017-08-20 NOTE — Telephone Encounter (Signed)
PATIENT WAS A NO SHOW AND LETTER SENT  °

## 2017-08-20 NOTE — Telephone Encounter (Signed)
Noted  

## 2017-08-28 DIAGNOSIS — J449 Chronic obstructive pulmonary disease, unspecified: Secondary | ICD-10-CM | POA: Diagnosis not present

## 2017-08-28 DIAGNOSIS — E1165 Type 2 diabetes mellitus with hyperglycemia: Secondary | ICD-10-CM | POA: Diagnosis not present

## 2017-08-28 DIAGNOSIS — M545 Low back pain: Secondary | ICD-10-CM | POA: Diagnosis not present

## 2017-08-28 DIAGNOSIS — M542 Cervicalgia: Secondary | ICD-10-CM | POA: Diagnosis not present

## 2017-09-05 ENCOUNTER — Ambulatory Visit (HOSPITAL_COMMUNITY)
Admission: RE | Admit: 2017-09-05 | Discharge: 2017-09-05 | Disposition: A | Discharge status: Home / Self Care | Payer: Medicare Other | Source: Ambulatory Visit | Attending: Pulmonary Disease | Admitting: Pulmonary Disease

## 2017-09-05 DIAGNOSIS — M48061 Spinal stenosis, lumbar region without neurogenic claudication: Secondary | ICD-10-CM | POA: Diagnosis not present

## 2017-09-05 DIAGNOSIS — M545 Low back pain: Secondary | ICD-10-CM | POA: Insufficient documentation

## 2017-09-05 DIAGNOSIS — M4326 Fusion of spine, lumbar region: Secondary | ICD-10-CM | POA: Diagnosis not present

## 2017-09-05 DIAGNOSIS — G031 Chronic meningitis: Secondary | ICD-10-CM | POA: Diagnosis not present

## 2017-09-24 DIAGNOSIS — M5136 Other intervertebral disc degeneration, lumbar region: Secondary | ICD-10-CM | POA: Diagnosis not present

## 2017-09-24 DIAGNOSIS — M4800 Spinal stenosis, site unspecified: Secondary | ICD-10-CM | POA: Diagnosis not present

## 2017-09-25 ENCOUNTER — Telehealth: Payer: Self-pay | Admitting: Internal Medicine

## 2017-09-25 NOTE — Telephone Encounter (Signed)
Received records from [facility] on 09/24/17 for Appt 10/11/17 8:15am. NV

## 2017-09-25 NOTE — Telephone Encounter (Signed)
   Primary Cardiologist:No primary care provider on file.  Chart reviewed as part of pre-operative protocol coverage. Because of [name]'s past medical history and time since last visit, he will require a follow-up visit in order to better assess preoperative cardiovascular risk. He has not been seen since 10/2016.   Pre-op covering staff: - Please schedule appointment and call patient to inform them. - Please contact requesting surgeon's office via preferred method (i.e, phone, fax) to inform them of need for appointment prior to surgery.  [name] DNP, ANP, AACC    09/25/2017, 1:14 PM

## 2017-09-25 NOTE — Telephone Encounter (Signed)
   [facility] Pre-operative Risk Assessment    Request for surgical clearance:  1. What type of surgery is being performed? Posterior lumbar fusion   2. When is this surgery scheduled? TBD   3. What type of clearance is required (medical clearance vs. Pharmacy clearance to hold med vs. Both)? Both   4. Are there any medications that need to be held prior to surgery and how long? plavix & aspirin 5 days prior   5. Practice name and name of physician performing surgery? Dr. Newman Pies   6. What is your office phone and fax number? (p) [phone]  Ext: [phone]  (f) [phone]   7. Anesthesia type (None, local, MAC, general) ? Not specified  ** patient has MD OV on 10/11/17   Sheral Apley M 09/25/2017, 9:11 AM  _________________________________________________________________   (provider comments below)

## 2017-09-25 NOTE — Telephone Encounter (Signed)
Has hilty appt scheduled 10-11-17 Forwarded to requesting party to notify

## 2017-09-26 ENCOUNTER — Other Ambulatory Visit: Payer: Self-pay | Admitting: Neurosurgery

## 2017-10-10 ENCOUNTER — Other Ambulatory Visit (HOSPITAL_COMMUNITY): Payer: Self-pay | Admitting: Neurosurgery

## 2017-10-10 ENCOUNTER — Ambulatory Visit (HOSPITAL_COMMUNITY)
Admission: RE | Admit: 2017-10-10 | Discharge: 2017-10-10 | Disposition: A | Discharge status: Home / Self Care | Payer: Medicare Other | Source: Ambulatory Visit | Attending: Neurosurgery | Admitting: Neurosurgery

## 2017-10-10 DIAGNOSIS — M5136 Other intervertebral disc degeneration, lumbar region: Secondary | ICD-10-CM | POA: Diagnosis not present

## 2017-10-10 DIAGNOSIS — M48061 Spinal stenosis, lumbar region without neurogenic claudication: Secondary | ICD-10-CM | POA: Diagnosis not present

## 2017-10-10 DIAGNOSIS — Z981 Arthrodesis status: Secondary | ICD-10-CM | POA: Diagnosis not present

## 2017-10-10 DIAGNOSIS — I700 Atherosclerosis of aorta: Secondary | ICD-10-CM | POA: Diagnosis not present

## 2017-10-10 DIAGNOSIS — M4800 Spinal stenosis, site unspecified: Secondary | ICD-10-CM

## 2017-10-10 DIAGNOSIS — Z79891 Long term (current) use of opiate analgesic: Secondary | ICD-10-CM | POA: Diagnosis not present

## 2017-10-10 DIAGNOSIS — M545 Low back pain: Secondary | ICD-10-CM | POA: Diagnosis present

## 2017-10-11 ENCOUNTER — Ambulatory Visit (INDEPENDENT_AMBULATORY_CARE_PROVIDER_SITE_OTHER): Payer: Medicare Other | Admitting: Internal Medicine

## 2017-10-11 ENCOUNTER — Encounter: Payer: Self-pay | Admitting: Internal Medicine

## 2017-10-11 VITALS — BP 154/82 | HR 89 | Ht 71.0 in | Wt 179.0 lb

## 2017-10-11 DIAGNOSIS — I10 Essential (primary) hypertension: Secondary | ICD-10-CM | POA: Diagnosis not present

## 2017-10-11 DIAGNOSIS — Z01810 Encounter for preprocedural cardiovascular examination: Secondary | ICD-10-CM | POA: Diagnosis not present

## 2017-10-11 DIAGNOSIS — Z951 Presence of aortocoronary bypass graft: Secondary | ICD-10-CM | POA: Diagnosis not present

## 2017-10-11 DIAGNOSIS — E782 Mixed hyperlipidemia: Secondary | ICD-10-CM

## 2017-10-11 DIAGNOSIS — I739 Peripheral vascular disease, unspecified: Secondary | ICD-10-CM | POA: Diagnosis not present

## 2017-10-11 MED ORDER — METOPROLOL SUCCINATE ER 25 MG PO TB24: 25.0000 mg | ORAL_TABLET | Freq: Every day | ORAL | 3 refills | Status: DC

## 2017-10-11 NOTE — Patient Instructions (Addendum)
Your physician has recommended you make the following change in your medication:  -- START metoprolol succinate 25mg  once daily  You can HOLD PLAVIX for 5 days prior to your surgery   Your physician wants you to follow-up in: ONE YEAR with Dr. Debara Pickett. You will receive a reminder letter in the mail two months in advance. If you don't receive a letter, please call our office to schedule the follow-up appointment.

## 2017-10-11 NOTE — Progress Notes (Signed)
OFFICE NOTE  Chief Complaint:  Preoperative cardiovascular examination  Primary Care Physician: Sinda Du, MD  HPI:  [name] is a 57 year old mildly overweight single, though lives with his girlfriend, Caucasian male father of one child seen for peripheral vascular disease because of claudication and diminished ABIs. Risk factors include continued tobacco abuse of one pack -2 pks per day, diabetes and hyperlipidemia. He was catheterized by Dr. Nicanor Bake 08/19/2012 demonstrated three-vessel disease. He ultimately underwent coronary artery bypass grafting x4 January 17 for LIMA to his LAD, a vein to the diagonal branch, ramus intermedius branch and to the RCA. He tolerated the procedure well. His EF was 50-55% by 2-D echo. Dopplers revealed ABIs of 0.5 and 0.7. He complaind of left greater than right claudication at one block. He underwent percutaneous revascularization using the Viance CTO catheter, diamondback orbital rotational atherectomy, chocolate balloon and IDEV Stent successfully to the mid Rt SFA. His main complaints are ongoing lower extremity swelling. This is fatigue and problems with his hips. He denies any chest pain or worsening shortness of breath.  He was recently sent to [facility] for additional peripheral arterial procedures to open the left SFA by Dr. [name] as Dr. [name] had great difficulty in crossing the lesion. Ultimately after 6 hours Dr. [name] was able to get across the lesion and improve arterial flow. Repeat Dopplers were performed recently demonstrating an ABI of 1.1 on the right and 1.0 on the left in September 2014.  He is also wondering today whether he can stop his Plavix.  [name] returns today for followup. He is doing quite well. He recent saw Dr. Luan Pulling. He was concerned about his ongoing smoking. He reports his blood sugars are improved however they are still not at goal. He denies any claudication symptoms and had ultrasound in March of this year  which shows excellent blood flow to his legs after stenting.   Saw Mr. Barradas back today for annual follow-up. Several weeks ago he had some electrical chest pain which lasted for just a few seconds while sitting on the couch. Otherwise he has no symptoms. Recently he's lost about 3-4 pounds every month for the past several months. He's plagued by chronic back pain and seems to have a good appetite. He's had significant workup including chest CT endoscopy and other tests including extensive lab work without any clear etiology of his weight loss. He is struggling from back pain and is planning to go see his neurosurgeon. He reports weakness and numbness and tingling in his legs.   10/04/2016  [name] returns today for follow-up. Overall he seems to be doing well. He continues to get some pain in his legs which is worse after he rests and is sitting for a period of time. He says it's like a burning sensation that gets better. He denies any claudication symptoms and had recent lower extremity arterial Dopplers which showed patent waveforms and normal blood flow. He was recently reassessed with regards to cholesterol and it remains elevated. He was previously on cholesterol medicine but that was discontinued for unknown reasons. He is now back on Crestor 20 mg daily. He's managed to cut back his smoking to half pack per day but is not yet motivated to quit. He understands the significant cardiovascular complications related to this. He is also on insulin-dependent diabetic and is not at goal A1c, recently he reported being over 8.  10/11/2017  [name] is seen today for annual follow-up.  Incidentally he recently has been having back problems and was seen by Dr. Arnoldo Morale.  He will likely need repeat back surgery based on his MRI findings.  He had previously had surgery by Dr. Joya Salm, who is retired.  He was referred for preoperative risk assessment.  He has a history of CABG as previously mentioned and he is on  Plavix.  In addition he has peripheral arterial disease.  Currently denies any claudication, chest pain or worsening shortness of breath, however over the winter he is not been very active.  In addition he is still a smoker but has cut back his smoking.  He understands that he is at high risk of poor wound healing is a smoker and but not only benefit from smoking cessation for surgery but as well as a lifetime benefit.  PMHx:  Past Medical History:  Diagnosis Date  . Arthritis    "some; in hips sometimes" (01/02/2013)  . Back pain    "w/prolonged walking or standing" (01/02/2013)  . CAD (coronary artery disease)    CABG X 4 08/2012  . COPD (chronic obstructive pulmonary disease) ([place])   . Esophageal erosions 05/24/01   egd by Dr. Gala Romney  . GERD (gastroesophageal reflux disease)   . H/O hiatal hernia   . History of gout   . Hyperlipidemia   . Hypertension    not on any medication for htn at present  . PAD (peripheral artery disease) ([place])    LEA DOPPLER, 11/11/2012 - moderate arterial insufficiency to lower extremities at rest, BILATERAL SFA-demonstrates occlusive disease with reconstitution of flow  . Tobacco abuse   . Tubular adenoma of colon 08/28/11  . Type II diabetes mellitus ([place])   . Unintentional weight loss     Past Surgical History:  Procedure Laterality Date  . ANTERIOR CERVICAL DECOMP/DISCECTOMY FUSION  12/19/2011   Procedure: ANTERIOR CERVICAL DECOMPRESSION/DISCECTOMY FUSION 2 LEVELS;  Surgeon: Floyce Stakes, MD;  Location: [facility];  Service: Neurosurgery;  Laterality: N/A;  Cervical four-five Cervical five-six  Anterior cervical decompression/diskectomy, fusion, plate  . ATHERECTOMY N/A 01/02/2013   Procedure: ATHERECTOMY;  Surgeon: Lorretta Harp, MD;  Location: [facility] CATH LAB;  Service: Cardiovascular;  Laterality: N/A;  . BACK SURGERY     x3  . CARDIAC CATHETERIZATION  08/19/2012   CABG warranted; Severe ostial LCx stenosis with mid to distal vessel occlusion  .  COLONOSCOPY W/ POLYPECTOMY  08/28/11   Rourk-tubular adenomas removed from ascending colon, suboptimal prep, diminutive rectal polyps  . COLONOSCOPY WITH PROPOFOL N/A 06/13/2017   Procedure: COLONOSCOPY WITH PROPOFOL;  Surgeon: Daneil Dolin, MD;  Location: [facility];  Service: Endoscopy;  Laterality: N/A;  9:15am  . CORONARY ARTERY BYPASS GRAFT  08/22/2012   Procedure: CORONARY ARTERY BYPASS GRAFTING (CABG);  Surgeon: Grace Isaac, MD;  Location: [place];  Service: Open Heart Surgery;  Laterality: N/A;  CABG x four,  using left internal mammary artery and right leg greater saphenous vein harvested endoscopically  . ESOPHAGOGASTRODUODENOSCOPY  05/24/01   by Dr. Gala Romney  . ESOPHAGOGASTRODUODENOSCOPY  08/28/11   Rourk-erosive reflux esophagitis, gastric erosions  . ESOPHAGOGASTRODUODENOSCOPY (EGD) WITH PROPOFOL N/A 06/13/2017   Procedure: ESOPHAGOGASTRODUODENOSCOPY (EGD) WITH PROPOFOL;  Surgeon: Daneil Dolin, MD;  Location: [facility];  Service: Endoscopy;  Laterality: N/A;  . INTRAOPERATIVE TRANSESOPHAGEAL ECHOCARDIOGRAM  08/22/2012   Procedure: INTRAOPERATIVE TRANSESOPHAGEAL ECHOCARDIOGRAM;  Surgeon: Grace Isaac, MD;  Location: [place];  Service: Open Heart Surgery;  Laterality: N/A;  .  LEFT HEART CATHETERIZATION WITH CORONARY ANGIOGRAM N/A 08/19/2012   Procedure: LEFT HEART CATHETERIZATION WITH CORONARY ANGIOGRAM;  Surgeon: [name], MD;  Location: [facility] CATH LAB;  Service: Cardiovascular;  Laterality: N/A;  . LOWER EXTREMITY ANGIOGRAM N/A 11/17/2012   Procedure: LOWER EXTREMITY ANGIOGRAM;  Surgeon: Lorretta Harp, MD;  Location[facility] CATH LAB;  Service: Cardiovascular;  Laterality: N/A;  . LOWER EXTREMITY ANGIOGRAM N/A 02/02/2013   Procedure: LOWER EXTREMITY ANGIOGRAM;  Surgeon: Lorretta Harp, MD;  Location: [facility] CATH LAB;  Service: Cardiovascular;  Laterality: N/A;  . LUMBAR [place]    . LUMBAR FUSION  ~ 2011  . PELVIC ABCESS DRAINAGE     x2  . POLYPECTOMY  06/13/2017    Procedure: POLYPECTOMY;  Surgeon: Daneil Dolin, MD;  [facility];  Service: Endoscopy;;  colon  . SHOULDER SURGERY Right   . VASCULAR SURGERY Right 01/02/2013   diamondback orbital rotational  atherectomy, chocolate balloon and IDEV  Stent.    FAMHx:  Family History  Problem Relation Age of Onset  . Anesthesia problems Neg Hx   . Colon cancer Neg Hx   . Gastric cancer Neg Hx   . Esophageal cancer Neg Hx     SOCHx:   reports that he has been smoking cigarettes.  He has a 58.50 pack-year smoking history. he has never used smokeless tobacco. He reports that he drinks about 7.2 oz of alcohol per week. He reports that he does not use drugs.  ALLERGIES:  No Known Allergies  ROS: Pertinent items noted in HPI and remainder of comprehensive ROS otherwise negative.  HOME MEDS: Current Outpatient Medications  Medication Sig Dispense Refill  . clopidogrel (PLAVIX) 75 MG tablet Take 1 tablet (75 mg total) by mouth daily with breakfast. 30 tablet 11  . oxyCODONE-acetaminophen (PERCOCET/ROXICET) 5-325 MG per tablet Take 1 tablet by mouth every 4 (four) hours as needed for pain. For pain (Patient taking differently: Take 1 tablet by mouth every 6 (six) hours as needed for moderate pain. ) 40 tablet 0  . pantoprazole (PROTONIX) 40 MG tablet Take 1 tablet (40 mg total) by mouth daily. 30 tablet 3  . PROAIR HFA 108 (90 BASE) MCG/ACT inhaler Inhale 2 puffs into the lungs every 6 (six) hours as needed for wheezing or shortness of breath.     . rosuvastatin (CRESTOR) 20 MG tablet Take 20 mg by mouth daily.    Marland Kitchen zolpidem (AMBIEN CR) 12.5 MG CR tablet Take 12.5 mg by mouth at bedtime as needed for sleep.      No current facility-administered medications for this visit.     LABS/IMAGING: No results found for this or any previous visit (from the past 48 hour(s)). Dg Lumbar Spine 2-3 Views  Result Date: 10/10/2017 CLINICAL DATA:  57 year old with spinal stenosis who has undergone 3 prior  lumbar surgeries and is scheduled for yet another surgery on March 26. Evaluate lumbar stability. EXAM: LUMBAR SPINE - 2-3 VIEW COMPARISON:  Lumbar spine MRI 09/05/2017, 03/15/2015 and earlier. FINDINGS: Standing AP image and lateral images in flexion and extension were obtained. Five non-rib-bearing lumbar vertebrae with anatomic posterior alignment. Prior L3-4 PLIF and interbody fusion with plugs. Hardware intact. Fusion plugs appropriately positioned in the disc space. Mild disc space narrowing at L2-3. Remaining disc spaces well-preserved. Limited range of motion but no evidence of instability in flexion and extension. Aortoiliac atherosclerosis without evidence of aneurysm. IMPRESSION: 1. Limited range of motion but no evidence of instability in  flexion and extension. 2. Solid appearing fusion at L3-4. 3. Mild degenerative disc disease at L2-3. 4.  Aortic Atherosclerosis (ICD10-170.0) Electronically Signed   By: Evangeline Dakin M.D.   On: 10/10/2017 15:37    VITALS: BP (!) 154/82   Pulse 89   Ht 5\' 11"  (1.803 m)   Wt 179 lb (81.2 kg)   BMI 24.97 kg/m   EXAM: General appearance: alert and no distress Neck: no JVD and supple, symmetrical, trachea midline Lungs: clear to auscultation bilaterally Heart: regular rate and rhythm, S1, S2 normal, no murmur, click, rub or gallop Abdomen: soft, non-tender; bowel sounds normal; no masses,  no organomegaly Extremities: edema 1+ bilaterally, bilateral leg muscle atrophy Pulses: 2+ and symmetric Skin: Skin color, texture, turgor normal. No rashes or lesions Neurologic: Grossly normal Psych: Mildy irritable  EKG: Normal sinus rhythm at 89, right atrial enlargement-personally reviewed  ASSESSMENT: 1. Acceptable risk for upcoming back surgery 2. Coronary disease status post four-vessel CABG in 2014 3. 60 pack year tobacco user - cut back to 1/2 ppd 4. Insulin-dependent diabetes 5. Dyslipidemia 6. Significant PAD status post bilateral SFA  intervention 7. Lower extremity edema  PLAN: 1.   Mr. Lipe is at acceptable risk for upcoming back surgery.  He is advised to hold his Plavix for 5 days prior to the procedure.  He should restarted at the discretion of his neurosurgeon, typically a week after surgery.  He denies any anginal symptoms or current claudication.  I strongly encouraged him to use his opportunity to stop smoking permanently.  Will benefit both his CAD, PAD and wound healing.  Follow-up with me annually or sooner as necessary.  [name], MD, [facility], [place] Director of the [facility] Lip[facility]  Cardiovascular Risk Reduction [facility] Diplomate of the American Board of Clinical Lipidology Attending Cardiologist  Direct Dial: [phone]  Fax: [phone]  Website:  [email or web address] 10/11/2017, 8:18 AM

## 2017-10-22 NOTE — Pre-Procedure Instructions (Signed)
DECLAN MIER  10/22/2017      [facility] - [place], [address] [place] [address] Phone: [phone] Fax: [phone]  [facility] - [place], [place] [address] Phone: [phone] Fax: [phone]    Your procedure is scheduled on October 30, 2017.  Report to [facility] Admitting at 08:15 A.M.  Call this number if you have problems the morning of surgery:  [phone]   Remember:  Do not eat food or drink liquids after midnight.   Take these medicines the morning of surgery with A SIP OF WATER : Metoprolol (Toprolol-XL) Oxycodone-Acetaminophen (Percocet) if needed Pantoprazole (Protonix)  Proair Inhaler if needed--bring with you  STOP your Plavix 5 days prior to surgery as directed by your Doctor.  7 days prior to surgery STOP taking any Aspirin (unless otherwise instructed by your surgeon), Aleve, Naproxen, Ibuprofen, Motrin, Advil, Goody's, BC's, all herbal medications, fish oil, and all vitamins.   NO smoking 24 hours prior to surgery.     How to Manage Your Diabetes Before and After Surgery  Why is it important to control my blood sugar before and after surgery? . Improving blood sugar levels before and after surgery helps healing and can limit problems. . A way of improving blood sugar control is eating a healthy diet by: o  Eating less sugar and carbohydrates o  Increasing activity/exercise o  Talking with your doctor about reaching your blood sugar goals . High blood sugars (greater than 180 mg/dL) can raise your risk of infections and slow your recovery, so you will need to focus on controlling your diabetes during the weeks before surgery. . Make sure that the doctor who takes care of your diabetes knows about your planned surgery including the date and location.  How do I manage my blood sugar before surgery? . Check your blood sugar  at least 4 times a day, starting 2 days before surgery, to make sure that the level is not too high or low. o Check your blood sugar the morning of your surgery when you wake up and every 2 hours until you get to the Short Stay unit. . If your blood sugar is less than 70 mg/dL, you will need to treat for low blood sugar: o Do not take insulin. o Treat a low blood sugar (less than 70 mg/dL) with  cup of clear juice (cranberry or apple), 4 glucose tablets, OR glucose gel. Recheck blood sugar in 15 minutes after treatment (to make sure it is greater than 70 mg/dL). If your blood sugar is not greater than 70 mg/dL on recheck, call [phone] o  for further instructions. . Report your blood sugar to the short stay nurse when you get to Short Stay.  . If you are admitted to the hospital after surgery: o Your blood sugar will be checked by the staff and you will probably be given insulin after surgery (instead of oral diabetes medicines) to make sure you have good blood sugar levels. o The goal for blood sugar control after surgery is 80-180 mg/dL.       WHAT DO I DO ABOUT MY DIABETES MEDICATION?   . THE NIGHT BEFORE SURGERY, DO NOT take bedtime insulin dose.  . THE MORNING OF SURGERY, DO NOT take morning insulin dose.    . If your CBG is greater than 220 mg/dL, you may take  of your sliding scale (correction) dose of insulin.    Do not wear jewelry.  Do not wear lotions, powders, or perfumes, or deodorant.  Do not shave 48 hours prior to surgery.  Men may shave face and neck.  Do not bring valuables to the hospital.  [facility] is not responsible for any belongings or valuables.  Contacts, dentures or bridgework may not be worn into surgery.  Leave your suitcase in the car.  After surgery it may be brought to your room.  For patients admitted to the hospital, discharge time will be determined by your treatment team.  Patients discharged the day of surgery will not be allowed to  drive home.   Special instructions:   [facility]- Preparing For Surgery  Before surgery, you can play an important role. Because skin is not sterile, your skin needs to be as free of germs as possible. You can reduce the number of germs on your skin by washing with CHG (chlorahexidine gluconate) Soap before surgery.  CHG is an antiseptic cleaner which kills germs and bonds with the skin to continue killing germs even after washing.  Please do not use if you have an allergy to CHG or antibacterial soaps. If your skin becomes reddened/irritated stop using the CHG.  Do not shave (including legs and underarms) for at least 48 hours prior to first CHG shower. It is OK to shave your face.  Please follow these instructions carefully.   1. Shower the NIGHT BEFORE SURGERY and the MORNING OF SURGERY with CHG.   2. If you chose to wash your hair, wash your hair first as usual with your normal shampoo.  3. After you shampoo, rinse your hair and body thoroughly to remove the shampoo.  4. Use CHG as you would any other liquid soap. You can apply CHG directly to the skin and wash gently with a scrungie or a clean washcloth.   5. Apply the CHG Soap to your body ONLY FROM THE NECK DOWN.  Do not use on open wounds or open sores. Avoid contact with your eyes, ears, mouth and genitals (private parts). Wash Face and genitals (private parts)  with your normal soap.  6. Wash thoroughly, paying special attention to the area where your surgery will be performed.  7. Thoroughly rinse your body with warm water from the neck down.  8. DO NOT shower/wash with your normal soap after using and rinsing off the CHG Soap.  9. Pat yourself dry with a CLEAN TOWEL.  10. Wear CLEAN PAJAMAS to bed the night before surgery, wear comfortable clothes the morning of surgery  11. Place CLEAN SHEETS on your bed the night of your first shower and DO NOT SLEEP WITH PETS.    Day of Surgery: Do not apply any  deodorants/lotions. Please wear clean clothes to the hospital/surgery center.      Please read over the following fact sheets that you were given.

## 2017-10-23 ENCOUNTER — Encounter (HOSPITAL_COMMUNITY)
Admission: RE | Admit: 2017-10-23 | Discharge: 2017-10-23 | Disposition: A | Discharge status: Home / Self Care | Payer: Medicare Other | Source: Ambulatory Visit | Attending: Neurosurgery | Admitting: Neurosurgery

## 2017-10-23 ENCOUNTER — Other Ambulatory Visit: Payer: Self-pay

## 2017-10-23 ENCOUNTER — Encounter (HOSPITAL_COMMUNITY): Payer: Self-pay

## 2017-10-23 DIAGNOSIS — E119 Type 2 diabetes mellitus without complications: Secondary | ICD-10-CM | POA: Insufficient documentation

## 2017-10-23 DIAGNOSIS — F17200 Nicotine dependence, unspecified, uncomplicated: Secondary | ICD-10-CM | POA: Diagnosis not present

## 2017-10-23 DIAGNOSIS — I10 Essential (primary) hypertension: Secondary | ICD-10-CM | POA: Insufficient documentation

## 2017-10-23 DIAGNOSIS — Z01812 Encounter for preprocedural laboratory examination: Secondary | ICD-10-CM | POA: Diagnosis not present

## 2017-10-23 DIAGNOSIS — J449 Chronic obstructive pulmonary disease, unspecified: Secondary | ICD-10-CM | POA: Diagnosis not present

## 2017-10-23 DIAGNOSIS — I2510 Atherosclerotic heart disease of native coronary artery without angina pectoris: Secondary | ICD-10-CM | POA: Insufficient documentation

## 2017-10-23 DIAGNOSIS — Z951 Presence of aortocoronary bypass graft: Secondary | ICD-10-CM | POA: Insufficient documentation

## 2017-10-23 DIAGNOSIS — Z95811 Presence of heart assist device: Secondary | ICD-10-CM | POA: Diagnosis not present

## 2017-10-23 DIAGNOSIS — E785 Hyperlipidemia, unspecified: Secondary | ICD-10-CM | POA: Insufficient documentation

## 2017-10-23 DIAGNOSIS — K219 Gastro-esophageal reflux disease without esophagitis: Secondary | ICD-10-CM | POA: Insufficient documentation

## 2017-10-23 LAB — TYPE AND SCREEN
ABO/RH(D): O POS
ANTIBODY SCREEN: NEGATIVE

## 2017-10-23 LAB — BASIC METABOLIC PANEL
Anion gap: 11 (ref 5–15)
BUN: 13 mg/dL (ref 6–20)
CALCIUM: 9.7 mg/dL (ref 8.9–10.3)
CO2: 23 mmol/L (ref 22–32)
Chloride: 101 mmol/L (ref 101–111)
Creatinine, Ser: 0.76 mg/dL (ref 0.61–1.24)
GFR calc Af Amer: >60 mL/min (ref >60)
GLUCOSE: 256 mg/dL — AB (ref 65–99)
Potassium: 4.3 mmol/L (ref 3.5–5.1)
Sodium: 135 mmol/L (ref 135–145)

## 2017-10-23 LAB — CBC
HCT: 46 % (ref 39.0–52.0)
Hemoglobin: 15.9 g/dL (ref 13.0–17.0)
MCH: 31.7 pg (ref 26.0–34.0)
MCHC: 34.6 g/dL (ref 30.0–36.0)
MCV: 91.8 fL (ref 78.0–100.0)
Platelets: 144 10*3/uL — ABNORMAL LOW (ref 150–400)
RBC: 5.01 MIL/uL (ref 4.22–5.81)
RDW: 12.4 % (ref 11.5–15.5)
WBC: 8.5 10*3/uL (ref 4.0–10.5)

## 2017-10-23 LAB — GLUCOSE, CAPILLARY: Glucose-Capillary: 247 mg/dL — ABNORMAL HIGH (ref 65–99)

## 2017-10-23 LAB — SURGICAL PCR SCREEN
MRSA, PCR: NEGATIVE
Staphylococcus aureus: NEGATIVE

## 2017-10-23 LAB — HEMOGLOBIN A1C
HEMOGLOBIN A1C: 9.5 % — AB (ref 4.8–5.6)
Mean Plasma Glucose: 225.95 mg/dL

## 2017-10-23 NOTE — Progress Notes (Signed)
PCP:Dr. Sinda Du in [place] Cardiologist: Dr. Geralyn Corwin  Fasting sugars 180  Pt. Was instructed to stop plavix on 10/25/17

## 2017-10-24 NOTE — Progress Notes (Signed)
Anesthesia Chart Review:  Pt is a 57 year old male scheduled for L4-5 PLIF, interbody prosthesis, posterior instrumentation and fusion, explore lumbar fusion on 10/30/2017 with Newman Pies, MD  - PCP is Sinda Du, MD - Cardiologist is Lyman Bishop, MD who cleared pt for surgery at last office visit 10/11/17  PMH includes: CAD (s/p CABG 08/22/12), PAD, HTN, DM, hyperlipidemia, COPD, GERD.  Current smoker.  BMI 25.  S/p ACDF 12/19/11  Medications include: Plavix, NovoLog, metoprolol, Protonix, albuterol, rosuvastatin.  Patient to stop Plavix 10/25/17  BP (!) 149/76   Pulse 89   Temp 36.7 C   Resp 20   Ht 5\' 11"  (1.803 m)   Wt 178 lb 9.6 oz (81 kg)   SpO2 99%   BMI 24.91 kg/m   Preoperative labs reviewed.   - HbA1c 9.5, glucose 256. I left voicemail for [facility] in Dr. Arnoldo Morale' office about uncontrolled DM  EKG 10/11/17: NSR.  RA enlargement.  TEE 08/22/12:  - Left ventricle: Systolic function was normal. The estimated ejection fraction was in the range of 50% to55%. - Aortic valve: No evidence of vegetation. - Mitral valve: No evidence of vegetation. - Left atrium: No evidence of thrombus in the atrial cavity or appendage. No evidence of thrombus in the appendage. - Atrial septum: No defect or patent foramen ovale was identified. Echo contrast study showed no right-to-left atrial level shunt, following an increase in RA pressureinduced by provocative maneuvers. - Tricuspid valve: No evidence of vegetation.  If glucose acceptable day of surgery, I anticipate pt can proceed with surgery as scheduled.   Willeen Cass, FNP-BC [facility] Short Stay Surgical Center/Anesthesiology Phone: [phone] 10/24/2017 3:07 PM

## 2017-10-25 DIAGNOSIS — M545 Low back pain: Secondary | ICD-10-CM | POA: Diagnosis not present

## 2017-10-25 DIAGNOSIS — I739 Peripheral vascular disease, unspecified: Secondary | ICD-10-CM | POA: Diagnosis not present

## 2017-10-25 DIAGNOSIS — E1165 Type 2 diabetes mellitus with hyperglycemia: Secondary | ICD-10-CM | POA: Diagnosis not present

## 2017-10-25 DIAGNOSIS — J449 Chronic obstructive pulmonary disease, unspecified: Secondary | ICD-10-CM | POA: Diagnosis not present

## 2017-10-28 DIAGNOSIS — M542 Cervicalgia: Secondary | ICD-10-CM | POA: Diagnosis not present

## 2017-10-28 DIAGNOSIS — J449 Chronic obstructive pulmonary disease, unspecified: Secondary | ICD-10-CM | POA: Diagnosis not present

## 2017-10-28 DIAGNOSIS — M545 Low back pain: Secondary | ICD-10-CM | POA: Diagnosis not present

## 2017-10-28 DIAGNOSIS — E1165 Type 2 diabetes mellitus with hyperglycemia: Secondary | ICD-10-CM | POA: Diagnosis not present

## 2017-10-30 ENCOUNTER — Inpatient Hospital Stay (HOSPITAL_COMMUNITY)
Admission: RE | Admit: 2017-10-30 | Discharge: 2017-10-31 | DRG: 455 | Disposition: A | Discharge status: Home / Self Care | Payer: Medicare Other | Source: Ambulatory Visit | Attending: Neurosurgery | Admitting: Neurosurgery

## 2017-10-30 ENCOUNTER — Inpatient Hospital Stay (HOSPITAL_COMMUNITY): Payer: Medicare Other | Admitting: Anesthesiology

## 2017-10-30 ENCOUNTER — Encounter (HOSPITAL_COMMUNITY): Payer: Self-pay | Admitting: *Deleted

## 2017-10-30 ENCOUNTER — Inpatient Hospital Stay (HOSPITAL_COMMUNITY): Payer: Medicare Other

## 2017-10-30 ENCOUNTER — Inpatient Hospital Stay (HOSPITAL_COMMUNITY)
Admission: RE | Disposition: A | Discharge status: Home / Self Care | Payer: Self-pay | Source: Ambulatory Visit | Attending: Neurosurgery

## 2017-10-30 ENCOUNTER — Inpatient Hospital Stay (HOSPITAL_COMMUNITY): Payer: Medicare Other | Admitting: Emergency Medicine

## 2017-10-30 DIAGNOSIS — Z794 Long term (current) use of insulin: Secondary | ICD-10-CM | POA: Diagnosis not present

## 2017-10-30 DIAGNOSIS — E785 Hyperlipidemia, unspecified: Secondary | ICD-10-CM | POA: Diagnosis not present

## 2017-10-30 DIAGNOSIS — E119 Type 2 diabetes mellitus without complications: Secondary | ICD-10-CM | POA: Diagnosis not present

## 2017-10-30 DIAGNOSIS — Z981 Arthrodesis status: Secondary | ICD-10-CM

## 2017-10-30 DIAGNOSIS — Z951 Presence of aortocoronary bypass graft: Secondary | ICD-10-CM | POA: Diagnosis not present

## 2017-10-30 DIAGNOSIS — M5416 Radiculopathy, lumbar region: Secondary | ICD-10-CM | POA: Diagnosis not present

## 2017-10-30 DIAGNOSIS — I2510 Atherosclerotic heart disease of native coronary artery without angina pectoris: Secondary | ICD-10-CM | POA: Diagnosis not present

## 2017-10-30 DIAGNOSIS — E1151 Type 2 diabetes mellitus with diabetic peripheral angiopathy without gangrene: Secondary | ICD-10-CM | POA: Diagnosis present

## 2017-10-30 DIAGNOSIS — F17210 Nicotine dependence, cigarettes, uncomplicated: Secondary | ICD-10-CM | POA: Diagnosis present

## 2017-10-30 DIAGNOSIS — M160 Bilateral primary osteoarthritis of hip: Secondary | ICD-10-CM | POA: Diagnosis not present

## 2017-10-30 DIAGNOSIS — Z7902 Long term (current) use of antithrombotics/antiplatelets: Secondary | ICD-10-CM

## 2017-10-30 DIAGNOSIS — M5116 Intervertebral disc disorders with radiculopathy, lumbar region: Secondary | ICD-10-CM | POA: Diagnosis present

## 2017-10-30 DIAGNOSIS — Z419 Encounter for procedure for purposes other than remedying health state, unspecified: Secondary | ICD-10-CM

## 2017-10-30 DIAGNOSIS — Z79899 Other long term (current) drug therapy: Secondary | ICD-10-CM

## 2017-10-30 DIAGNOSIS — I10 Essential (primary) hypertension: Secondary | ICD-10-CM | POA: Diagnosis present

## 2017-10-30 DIAGNOSIS — K219 Gastro-esophageal reflux disease without esophagitis: Secondary | ICD-10-CM | POA: Diagnosis present

## 2017-10-30 DIAGNOSIS — M4326 Fusion of spine, lumbar region: Secondary | ICD-10-CM | POA: Diagnosis not present

## 2017-10-30 DIAGNOSIS — M48062 Spinal stenosis, lumbar region with neurogenic claudication: Principal | ICD-10-CM | POA: Diagnosis present

## 2017-10-30 DIAGNOSIS — J449 Chronic obstructive pulmonary disease, unspecified: Secondary | ICD-10-CM | POA: Diagnosis not present

## 2017-10-30 LAB — POCT I-STAT 4, (NA,K, GLUC, HGB,HCT)
Glucose, Bld: 85 mg/dL (ref 65–99)
HCT: 39 % (ref 39.0–52.0)
HEMOGLOBIN: 13.3 g/dL (ref 13.0–17.0)
POTASSIUM: 4.1 mmol/L (ref 3.5–5.1)
Sodium: 141 mmol/L (ref 135–145)

## 2017-10-30 LAB — GLUCOSE, CAPILLARY
GLUCOSE-CAPILLARY: 168 mg/dL — AB (ref 65–99)
Glucose-Capillary: 229 mg/dL — ABNORMAL HIGH (ref 65–99)
Glucose-Capillary: 170 mg/dL — ABNORMAL HIGH (ref 65–99)
Glucose-Capillary: 232 mg/dL — ABNORMAL HIGH (ref 65–99)
Glucose-Capillary: 360 mg/dL — ABNORMAL HIGH (ref 65–99)

## 2017-10-30 SURGERY — POSTERIOR LUMBAR FUSION 1 LEVEL
Anesthesia: General | Site: Back

## 2017-10-30 MED ORDER — GLYCOPYRROLATE 0.2 MG/ML IV SOSY
PREFILLED_SYRINGE | INTRAVENOUS | Status: DC | PRN
  Administered 2017-10-30: 0.6 mg via INTRAVENOUS

## 2017-10-30 MED ORDER — MENTHOL 3 MG MT LOZG: 1.0000 | LOZENGE | OROMUCOSAL | Status: DC | PRN

## 2017-10-30 MED ORDER — CYCLOBENZAPRINE HCL 10 MG PO TABS
ORAL_TABLET | ORAL | Status: AC
  Administered 2017-10-30: 10 mg via ORAL
  Filled 2017-10-30: qty 1

## 2017-10-30 MED ORDER — CYCLOBENZAPRINE HCL 10 MG PO TABS
10.0000 mg | ORAL_TABLET | Freq: Three times a day (TID) | ORAL | Status: DC | PRN
  Administered 2017-10-30 – 2017-10-31 (×3): 10 mg via ORAL
  Filled 2017-10-30 (×2): qty 1

## 2017-10-30 MED ORDER — ONDANSETRON HCL 4 MG/2ML IJ SOLN
INTRAMUSCULAR | Status: AC
  Filled 2017-10-30: qty 2

## 2017-10-30 MED ORDER — THROMBIN 20000 UNITS EX SOLR
CUTANEOUS | Status: AC
  Filled 2017-10-30: qty 20000

## 2017-10-30 MED ORDER — ACETAMINOPHEN 325 MG PO TABS: 650.0000 mg | ORAL_TABLET | ORAL | Status: DC | PRN

## 2017-10-30 MED ORDER — FENTANYL CITRATE (PF) 100 MCG/2ML IJ SOLN
INTRAMUSCULAR | Status: AC
  Administered 2017-10-30: 50 ug via INTRAVENOUS
  Filled 2017-10-30: qty 2

## 2017-10-30 MED ORDER — VANCOMYCIN HCL 1000 MG IV SOLR
INTRAVENOUS | Status: AC
  Filled 2017-10-30: qty 1000

## 2017-10-30 MED ORDER — ONDANSETRON HCL 4 MG/2ML IJ SOLN: 4.0000 mg | Freq: Four times a day (QID) | INTRAMUSCULAR | Status: DC | PRN

## 2017-10-30 MED ORDER — SODIUM CHLORIDE 0.9% FLUSH
3.0000 mL | Freq: Two times a day (BID) | INTRAVENOUS | Status: DC
  Administered 2017-10-30 (×2): 3 mL via INTRAVENOUS

## 2017-10-30 MED ORDER — BUPIVACAINE LIPOSOME 1.3 % IJ SUSP
INTRAMUSCULAR | Status: DC | PRN
  Administered 2017-10-30: 20 mL

## 2017-10-30 MED ORDER — OXYCODONE HCL 5 MG PO TABS
10.0000 mg | ORAL_TABLET | ORAL | Status: DC | PRN
  Administered 2017-10-30 – 2017-10-31 (×5): 10 mg via ORAL
  Filled 2017-10-30 (×4): qty 2

## 2017-10-30 MED ORDER — ALBUTEROL SULFATE (2.5 MG/3ML) 0.083% IN NEBU: 2.5000 mg | INHALATION_SOLUTION | Freq: Four times a day (QID) | RESPIRATORY_TRACT | Status: DC | PRN

## 2017-10-30 MED ORDER — DEXAMETHASONE SODIUM PHOSPHATE 10 MG/ML IJ SOLN
INTRAMUSCULAR | Status: AC
  Filled 2017-10-30: qty 1

## 2017-10-30 MED ORDER — LIDOCAINE HCL (CARDIAC) 20 MG/ML IV SOLN
INTRAVENOUS | Status: AC
  Filled 2017-10-30: qty 5

## 2017-10-30 MED ORDER — BUPIVACAINE LIPOSOME 1.3 % IJ SUSP
20.0000 mL | Freq: Once | INTRAMUSCULAR | Status: DC
  Filled 2017-10-30: qty 20

## 2017-10-30 MED ORDER — PHENYLEPHRINE 40 MCG/ML (10ML) SYRINGE FOR IV PUSH (FOR BLOOD PRESSURE SUPPORT)
PREFILLED_SYRINGE | INTRAVENOUS | Status: DC | PRN
  Administered 2017-10-30 (×4): 40 ug via INTRAVENOUS
  Administered 2017-10-30 (×3): 80 ug via INTRAVENOUS

## 2017-10-30 MED ORDER — ROSUVASTATIN CALCIUM 20 MG PO TABS
20.0000 mg | ORAL_TABLET | Freq: Every day | ORAL | Status: DC
  Administered 2017-10-31: 20 mg via ORAL
  Filled 2017-10-30: qty 1

## 2017-10-30 MED ORDER — THROMBIN 5000 UNITS EX SOLR
CUTANEOUS | Status: AC
  Filled 2017-10-30: qty 5000

## 2017-10-30 MED ORDER — BUPIVACAINE-EPINEPHRINE (PF) 0.5% -1:200000 IJ SOLN
INTRAMUSCULAR | Status: AC
  Filled 2017-10-30: qty 30

## 2017-10-30 MED ORDER — OXYCODONE HCL 5 MG/5ML PO SOLN: 5.0000 mg | Freq: Once | ORAL | Status: DC | PRN

## 2017-10-30 MED ORDER — VANCOMYCIN HCL 1 G IV SOLR
INTRAVENOUS | Status: DC | PRN
  Administered 2017-10-30: 1000 mg via TOPICAL

## 2017-10-30 MED ORDER — CEFAZOLIN SODIUM-DEXTROSE 2-4 GM/100ML-% IV SOLN
INTRAVENOUS | Status: AC
  Filled 2017-10-30: qty 100

## 2017-10-30 MED ORDER — SCOPOLAMINE 1 MG/3DAYS TD PT72
MEDICATED_PATCH | TRANSDERMAL | Status: DC | PRN
  Administered 2017-10-30: 1 via TRANSDERMAL

## 2017-10-30 MED ORDER — CHLORHEXIDINE GLUCONATE CLOTH 2 % EX PADS: 6.0000 | MEDICATED_PAD | Freq: Once | CUTANEOUS | Status: DC

## 2017-10-30 MED ORDER — ONDANSETRON HCL 4 MG PO TABS: 4.0000 mg | ORAL_TABLET | Freq: Four times a day (QID) | ORAL | Status: DC | PRN

## 2017-10-30 MED ORDER — ROCURONIUM BROMIDE 10 MG/ML (PF) SYRINGE
PREFILLED_SYRINGE | INTRAVENOUS | Status: DC | PRN
  Administered 2017-10-30: 20 mg via INTRAVENOUS
  Administered 2017-10-30: 60 mg via INTRAVENOUS
  Administered 2017-10-30: 20 mg via INTRAVENOUS

## 2017-10-30 MED ORDER — OXYCODONE HCL 5 MG PO TABS
ORAL_TABLET | ORAL | Status: AC
  Filled 2017-10-30: qty 2

## 2017-10-30 MED ORDER — INSULIN ASPART PROT & ASPART (70-30 MIX) 100 UNIT/ML ~~LOC~~ SUSP
55.0000 [IU] | Freq: Every day | SUBCUTANEOUS | Status: DC
  Administered 2017-10-30: 55 [IU] via SUBCUTANEOUS
  Filled 2017-10-30: qty 10

## 2017-10-30 MED ORDER — ACETAMINOPHEN 500 MG PO TABS
1000.0000 mg | ORAL_TABLET | Freq: Four times a day (QID) | ORAL | Status: DC
Start: 2017-10-30 — End: 2017-10-31
  Administered 2017-10-30 – 2017-10-31 (×3): 1000 mg via ORAL
  Filled 2017-10-30 (×3): qty 2

## 2017-10-30 MED ORDER — OXYCODONE HCL 5 MG PO TABS: 5.0000 mg | ORAL_TABLET | ORAL | Status: DC | PRN

## 2017-10-30 MED ORDER — MORPHINE SULFATE (PF) 4 MG/ML IV SOLN
4.0000 mg | INTRAVENOUS | Status: DC | PRN
  Administered 2017-10-30: 4 mg via INTRAVENOUS
  Filled 2017-10-30: qty 1

## 2017-10-30 MED ORDER — ONDANSETRON HCL 4 MG/2ML IJ SOLN
INTRAMUSCULAR | Status: DC | PRN
  Administered 2017-10-30: 4 mg via INTRAVENOUS

## 2017-10-30 MED ORDER — PHENOL 1.4 % MT LIQD: 1.0000 | OROMUCOSAL | Status: DC | PRN

## 2017-10-30 MED ORDER — NEOSTIGMINE METHYLSULFATE 5 MG/5ML IV SOSY
PREFILLED_SYRINGE | INTRAVENOUS | Status: DC | PRN
  Administered 2017-10-30: 4 mg via INTRAVENOUS

## 2017-10-30 MED ORDER — MIDAZOLAM HCL 5 MG/5ML IJ SOLN
INTRAMUSCULAR | Status: DC | PRN
  Administered 2017-10-30: 2 mg via INTRAVENOUS

## 2017-10-30 MED ORDER — NEOSTIGMINE METHYLSULFATE 5 MG/5ML IV SOSY
PREFILLED_SYRINGE | INTRAVENOUS | Status: AC
  Filled 2017-10-30: qty 5

## 2017-10-30 MED ORDER — PHENYLEPHRINE 40 MCG/ML (10ML) SYRINGE FOR IV PUSH (FOR BLOOD PRESSURE SUPPORT)
PREFILLED_SYRINGE | INTRAVENOUS | Status: AC
  Filled 2017-10-30: qty 10

## 2017-10-30 MED ORDER — INSULIN ASPART 100 UNIT/ML ~~LOC~~ SOLN
SUBCUTANEOUS | Status: AC
  Administered 2017-10-30: 5 [IU] via SUBCUTANEOUS
  Filled 2017-10-30: qty 1

## 2017-10-30 MED ORDER — BISACODYL 10 MG RE SUPP: 10.0000 mg | Freq: Every day | RECTAL | Status: DC | PRN

## 2017-10-30 MED ORDER — THROMBIN (RECOMBINANT) 5000 UNITS EX SOLR
OROMUCOSAL | Status: DC | PRN
  Administered 2017-10-30: 11:00:00 via TOPICAL

## 2017-10-30 MED ORDER — DEXAMETHASONE SODIUM PHOSPHATE 10 MG/ML IJ SOLN
INTRAMUSCULAR | Status: DC | PRN
  Administered 2017-10-30: 10 mg via INTRAVENOUS

## 2017-10-30 MED ORDER — HYDROMORPHONE HCL 1 MG/ML IJ SOLN
INTRAMUSCULAR | Status: DC | PRN
  Administered 2017-10-30: 0.5 mg via INTRAVENOUS

## 2017-10-30 MED ORDER — BUPIVACAINE-EPINEPHRINE (PF) 0.5% -1:200000 IJ SOLN
INTRAMUSCULAR | Status: DC | PRN
  Administered 2017-10-30: 10 mL

## 2017-10-30 MED ORDER — INSULIN ASPART 100 UNIT/ML ~~LOC~~ SOLN: 0.0000 [IU] | Freq: Three times a day (TID) | SUBCUTANEOUS | Status: DC

## 2017-10-30 MED ORDER — HYDROMORPHONE HCL 1 MG/ML IJ SOLN
INTRAMUSCULAR | Status: AC
  Filled 2017-10-30: qty 0.5

## 2017-10-30 MED ORDER — PROPOFOL 10 MG/ML IV BOLUS
INTRAVENOUS | Status: DC | PRN
  Administered 2017-10-30: 30 mg via INTRAVENOUS
  Administered 2017-10-30: 170 mg via INTRAVENOUS

## 2017-10-30 MED ORDER — CEFAZOLIN SODIUM-DEXTROSE 2-4 GM/100ML-% IV SOLN
2.0000 g | INTRAVENOUS | Status: AC
  Administered 2017-10-30: 2 g via INTRAVENOUS

## 2017-10-30 MED ORDER — MIDAZOLAM HCL 2 MG/2ML IJ SOLN
INTRAMUSCULAR | Status: AC
  Filled 2017-10-30: qty 2

## 2017-10-30 MED ORDER — SCOPOLAMINE 1 MG/3DAYS TD PT72
MEDICATED_PATCH | TRANSDERMAL | Status: AC
  Filled 2017-10-30: qty 1

## 2017-10-30 MED ORDER — CEFAZOLIN SODIUM-DEXTROSE 2-4 GM/100ML-% IV SOLN
2.0000 g | Freq: Three times a day (TID) | INTRAVENOUS | Status: AC
  Administered 2017-10-30 – 2017-10-31 (×2): 2 g via INTRAVENOUS
  Filled 2017-10-30 (×2): qty 100

## 2017-10-30 MED ORDER — INSULIN ASPART 100 UNIT/ML ~~LOC~~ SOLN: 10.0000 [IU] | SUBCUTANEOUS | Status: DC

## 2017-10-30 MED ORDER — PROMETHAZINE HCL 25 MG/ML IJ SOLN: 6.2500 mg | INTRAMUSCULAR | Status: DC | PRN

## 2017-10-30 MED ORDER — LACTATED RINGERS IV SOLN
INTRAVENOUS | Status: DC
  Administered 2017-10-30 (×3): via INTRAVENOUS

## 2017-10-30 MED ORDER — SODIUM CHLORIDE 0.9 % IR SOLN
Status: DC | PRN
  Administered 2017-10-30: 11:00:00

## 2017-10-30 MED ORDER — INSULIN ASPART 100 UNIT/ML ~~LOC~~ SOLN
5.0000 [IU] | Freq: Once | SUBCUTANEOUS | Status: AC
  Administered 2017-10-30: 5 [IU] via SUBCUTANEOUS

## 2017-10-30 MED ORDER — PANTOPRAZOLE SODIUM 40 MG PO TBEC
40.0000 mg | DELAYED_RELEASE_TABLET | Freq: Every day | ORAL | Status: DC
  Administered 2017-10-31: 40 mg via ORAL
  Filled 2017-10-30: qty 1

## 2017-10-30 MED ORDER — ROCURONIUM BROMIDE 10 MG/ML (PF) SYRINGE
PREFILLED_SYRINGE | INTRAVENOUS | Status: AC
  Filled 2017-10-30: qty 5

## 2017-10-30 MED ORDER — 0.9 % SODIUM CHLORIDE (POUR BTL) OPTIME
TOPICAL | Status: DC | PRN
  Administered 2017-10-30: 1000 mL

## 2017-10-30 MED ORDER — OXYCODONE HCL 5 MG PO TABS: 5.0000 mg | ORAL_TABLET | Freq: Once | ORAL | Status: DC | PRN

## 2017-10-30 MED ORDER — ACETAMINOPHEN 650 MG RE SUPP: 650.0000 mg | RECTAL | Status: DC | PRN

## 2017-10-30 MED ORDER — INSULIN ASPART PROT & ASPART (70-30 MIX) 100 UNIT/ML ~~LOC~~ SUSP
25.0000 [IU] | Freq: Every day | SUBCUTANEOUS | Status: DC
  Administered 2017-10-31: 25 [IU] via SUBCUTANEOUS
  Filled 2017-10-30: qty 10

## 2017-10-30 MED ORDER — FENTANYL CITRATE (PF) 100 MCG/2ML IJ SOLN
25.0000 ug | INTRAMUSCULAR | Status: DC | PRN
  Administered 2017-10-30 (×2): 50 ug via INTRAVENOUS

## 2017-10-30 MED ORDER — PROPOFOL 10 MG/ML IV BOLUS
INTRAVENOUS | Status: AC
  Filled 2017-10-30: qty 40

## 2017-10-30 MED ORDER — SODIUM CHLORIDE 0.9% FLUSH: 3.0000 mL | INTRAVENOUS | Status: DC | PRN

## 2017-10-30 MED ORDER — FENTANYL CITRATE (PF) 250 MCG/5ML IJ SOLN
INTRAMUSCULAR | Status: AC
  Filled 2017-10-30: qty 5

## 2017-10-30 MED ORDER — FENTANYL CITRATE (PF) 100 MCG/2ML IJ SOLN
INTRAMUSCULAR | Status: DC | PRN
  Administered 2017-10-30 (×5): 50 ug via INTRAVENOUS

## 2017-10-30 MED ORDER — ARTIFICIAL TEARS OPHTHALMIC OINT
TOPICAL_OINTMENT | OPHTHALMIC | Status: AC
  Filled 2017-10-30: qty 3.5

## 2017-10-30 MED ORDER — BACITRACIN ZINC 500 UNIT/GM EX OINT
TOPICAL_OINTMENT | CUTANEOUS | Status: AC
  Filled 2017-10-30: qty 28.35

## 2017-10-30 MED ORDER — LIDOCAINE 2% (20 MG/ML) 5 ML SYRINGE
INTRAMUSCULAR | Status: DC | PRN
  Administered 2017-10-30: 80 mg via INTRAVENOUS

## 2017-10-30 MED ORDER — METOPROLOL SUCCINATE ER 25 MG PO TB24
25.0000 mg | ORAL_TABLET | Freq: Every day | ORAL | Status: DC
  Filled 2017-10-30: qty 1

## 2017-10-30 MED ORDER — DOCUSATE SODIUM 100 MG PO CAPS
100.0000 mg | ORAL_CAPSULE | Freq: Two times a day (BID) | ORAL | Status: DC
  Administered 2017-10-30: 100 mg via ORAL
  Filled 2017-10-30: qty 1

## 2017-10-30 MED ORDER — BACITRACIN ZINC 500 UNIT/GM EX OINT
TOPICAL_OINTMENT | CUTANEOUS | Status: DC | PRN
  Administered 2017-10-30: 1 via TOPICAL

## 2017-10-30 SURGICAL SUPPLY — 73 items
APL SKNCLS STERI-STRIP NONHPOA (GAUZE/BANDAGES/DRESSINGS) ×1
BAG DECANTER FOR FLEXI CONT (MISCELLANEOUS) ×3 IMPLANT
BENZOIN TINCTURE PRP APPL 2/3 (GAUZE/BANDAGES/DRESSINGS) ×3 IMPLANT
BLADE CLIPPER SURG (BLADE) IMPLANT
BUR MATCHSTICK NEURO 3.0 LAGG (BURR) ×3 IMPLANT
BUR PRECISION FLUTE 6.0 (BURR) ×3 IMPLANT
CANISTER SUCT 3000ML PPV (MISCELLANEOUS) ×3 IMPLANT
CARTRIDGE OIL MAESTRO DRILL (MISCELLANEOUS) ×1 IMPLANT
CATH FOLEY 2WAY SLVR  5CC 14FR (CATHETERS) ×2
CATH FOLEY 2WAY SLVR 5CC 14FR (CATHETERS) IMPLANT
CLOSURE WOUND 1/2 X4 (GAUZE/BANDAGES/DRESSINGS) ×1
CONT SPEC 4OZ CLIKSEAL STRL BL (MISCELLANEOUS) ×3 IMPLANT
COVER BACK TABLE 60X90IN (DRAPES) ×4 IMPLANT
DECANTER SPIKE VIAL GLASS SM (MISCELLANEOUS) ×3 IMPLANT
DIFFUSER DRILL AIR PNEUMATIC (MISCELLANEOUS) ×3 IMPLANT
DRAPE C-ARM 42X72 X-RAY (DRAPES) ×6 IMPLANT
DRAPE HALF SHEET 40X57 (DRAPES) ×3 IMPLANT
DRAPE LAPAROTOMY 100X72X124 (DRAPES) ×3 IMPLANT
DRAPE SURG 17X23 STRL (DRAPES) ×12 IMPLANT
DRSG OPSITE POSTOP 4X6 (GAUZE/BANDAGES/DRESSINGS) ×2 IMPLANT
ELECT BLADE 4.0 EZ CLEAN MEGAD (MISCELLANEOUS) ×3
ELECT REM PT RETURN 9FT ADLT (ELECTROSURGICAL) ×3
ELECTRODE BLDE 4.0 EZ CLN MEGD (MISCELLANEOUS) ×1 IMPLANT
ELECTRODE REM PT RTRN 9FT ADLT (ELECTROSURGICAL) ×1 IMPLANT
EVACUATOR 1/8 PVC DRAIN (DRAIN) IMPLANT
GAUZE SPONGE 4X4 12PLY STRL (GAUZE/BANDAGES/DRESSINGS) ×3 IMPLANT
GAUZE SPONGE 4X4 16PLY XRAY LF (GAUZE/BANDAGES/DRESSINGS) ×1 IMPLANT
GLOVE BIO SURGEON STRL SZ8 (GLOVE) ×8 IMPLANT
GLOVE BIO SURGEON STRL SZ8.5 (GLOVE) ×6 IMPLANT
GLOVE BIOGEL PI IND STRL 6.5 (GLOVE) IMPLANT
GLOVE BIOGEL PI IND STRL 8 (GLOVE) IMPLANT
GLOVE BIOGEL PI INDICATOR 6.5 (GLOVE) ×2
GLOVE BIOGEL PI INDICATOR 8 (GLOVE) ×12
GLOVE ECLIPSE 7.5 STRL STRAW (GLOVE) ×6 IMPLANT
GLOVE EXAM NITRILE LRG STRL (GLOVE) IMPLANT
GLOVE EXAM NITRILE XL STR (GLOVE) IMPLANT
GLOVE EXAM NITRILE XS STR PU (GLOVE) IMPLANT
GLOVE SURG SS PI 6.0 STRL IVOR (GLOVE) ×4 IMPLANT
GOWN STRL REUS W/ TWL LRG LVL3 (GOWN DISPOSABLE) IMPLANT
GOWN STRL REUS W/ TWL XL LVL3 (GOWN DISPOSABLE) ×2 IMPLANT
GOWN STRL REUS W/TWL 2XL LVL3 (GOWN DISPOSABLE) ×4 IMPLANT
GOWN STRL REUS W/TWL LRG LVL3 (GOWN DISPOSABLE) ×3
GOWN STRL REUS W/TWL XL LVL3 (GOWN DISPOSABLE) ×9
HEMOSTAT POWDER KIT SURGIFOAM (HEMOSTASIS) ×3 IMPLANT
KIT BASIN OR (CUSTOM PROCEDURE TRAY) ×3 IMPLANT
KIT TURNOVER KIT B (KITS) ×3 IMPLANT
MILL MEDIUM DISP (BLADE) ×1 IMPLANT
NDL HYPO 21X1.5 SAFETY (NEEDLE) IMPLANT
NEEDLE HYPO 21X1.5 SAFETY (NEEDLE) ×3 IMPLANT
NEEDLE HYPO 22GX1.5 SAFETY (NEEDLE) ×3 IMPLANT
NS IRRIG 1000ML POUR BTL (IV SOLUTION) ×3 IMPLANT
OIL CARTRIDGE MAESTRO DRILL (MISCELLANEOUS) ×3
PACK LAMINECTOMY NEURO (CUSTOM PROCEDURE TRAY) ×3 IMPLANT
PAD ARMBOARD 7.5X6 YLW CONV (MISCELLANEOUS) ×9 IMPLANT
PATTIES SURGICAL .5 X1 (DISPOSABLE) IMPLANT
ROD PREBENT 6.35X80 (Rod) ×4 IMPLANT
SCREW PEDICLE VA L635 7.5X55M (Screw) ×4 IMPLANT
SCREW SET BREAK OFF (Screw) ×12 IMPLANT
SPACER ALTERA 10X31-15 (Spacer) ×2 IMPLANT
SPONGE LAP 4X18 X RAY DECT (DISPOSABLE) IMPLANT
SPONGE NEURO XRAY DETECT 1X3 (DISPOSABLE) IMPLANT
SPONGE SURGIFOAM ABS GEL 100 (HEMOSTASIS) IMPLANT
STRIP BIOACTIVE 10CC 25X50X8 (Miscellaneous) ×2 IMPLANT
STRIP BIOACTIVE 20CC 25X100X8 (Miscellaneous) ×2 IMPLANT
STRIP CLOSURE SKIN 1/2X4 (GAUZE/BANDAGES/DRESSINGS) ×2 IMPLANT
SUT VIC AB 1 CT1 18XBRD ANBCTR (SUTURE) ×2 IMPLANT
SUT VIC AB 1 CT1 8-18 (SUTURE) ×6
SUT VIC AB 2-0 CP2 18 (SUTURE) ×6 IMPLANT
SYR 20CC LL (SYRINGE) ×2 IMPLANT
TOWEL GREEN STERILE (TOWEL DISPOSABLE) ×3 IMPLANT
TOWEL GREEN STERILE FF (TOWEL DISPOSABLE) ×3 IMPLANT
TRAY FOLEY W/METER SILVER 16FR (SET/KITS/TRAYS/PACK) ×3 IMPLANT
WATER STERILE IRR 1000ML POUR (IV SOLUTION) ×3 IMPLANT

## 2017-10-30 NOTE — Anesthesia Preprocedure Evaluation (Addendum)
Anesthesia Evaluation  Patient identified by MRN, date of birth, ID band Patient awake    Reviewed: Allergy & Precautions, NPO status , Patient's Chart, lab work & pertinent test results, reviewed documented beta blocker date and time   Airway Mallampati: II  TM Distance: >3 FB     Dental  (+) Teeth Intact, Dental Advisory Given   Pulmonary COPD, Current Smoker,    breath sounds clear to auscultation       Cardiovascular hypertension, Pt. on medications and Pt. on home beta blockers + CAD, + CABG and + Peripheral Vascular Disease   Rhythm:Regular Rate:Normal     Neuro/Psych S/p ACDF negative psych ROS   GI/Hepatic hiatal hernia, GERD  Medicated and Controlled,  Endo/Other  diabetes, Poorly Controlled, Type 2, Insulin Dependent  Renal/GU      Musculoskeletal  (+) Arthritis , Gout   Abdominal   Peds  Hematology Mild thrombocytopenia   Anesthesia Other Findings   Reproductive/Obstetrics                             Anesthesia Physical  Anesthesia Plan  ASA: III  Anesthesia Plan: General   Post-op Pain Management:    Induction: Intravenous  PONV Risk Score and Plan: 3 and Treatment may vary due to age or medical condition, Ondansetron, Dexamethasone, Midazolam and Scopolamine patch - Pre-op  Airway Management Planned: Oral ETT  Additional Equipment: None  Intra-op Plan:   Post-operative Plan: Extubation in OR  Informed Consent: I have reviewed the patients History and Physical, chart, labs and discussed the procedure including the risks, benefits and alternatives for the proposed anesthesia with the patient or authorized representative who has indicated his/her understanding and acceptance.   Dental advisory given  Plan Discussed with: CRNA and Anesthesiologist  Anesthesia Plan Comments:         Anesthesia Quick Evaluation

## 2017-10-30 NOTE — Op Note (Signed)
Brief history: The patient is a 57 year old white male on whom I another physician performed a lumbar fusion about 9 years ago.  He has developed recurrent back and leg pain consistent with neurogenic claudication.  He has failed medical management and was worked up with lumbar x-rays and a lumbar MRI.  This demonstrated severe foraminal stenosis at L4-5.  I discussed the various treatment options with the patient including surgery.  He has weighed the risks, benefits, and alternatives surgery and decided to proceed with an L4-5 decompression, instrumentation, and fusion.  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 Etc. requiring a wide decompression of the nerve roots.);  L4-5 transforaminal lumbar interbody fusion with local morselized autograft bone and Kinnex graft extender; insertion of interbody prosthesis at L4-5 (globus peek expandable interbody prosthesis); posterior mental instrumentation from L3 to L5 with globus titanium pedicle screws and rods; posterior lateral arthrodesis at L4-5 bilaterally with local morselized autograft bone and Kinnex bone graft extender; explore lumbar fusion  Surgeon: Dr. Earle Gell  Asst.: Dr. Sherley Bounds  Anesthesia: Gen. endotracheal  Estimated blood loss: 250 cc  Drains: None  Complications: None  Description of procedure: The patient was brought to the operating room by the anesthesia team. General endotracheal anesthesia was induced. The patient was turned to the prone position on the Wilson frame. The patient's lumbosacral region  was then prepared with Betadine scrub and Betadine solution. Sterile drapes were applied.  I then injected the area to be incised with Marcaine with epinephrine solution. I then used the scalpel to make a linear midline incision over the L3-4 and L4-5 interspace, incising through the old surgical scar. I then used electrocautery to perform a bilateral subperiosteal dissection exposing the spinous process and lamina of L3, L4 and L5 as well as exposing the old hardware at L3-4. We then inserted the Verstrac retractor to provide exposure.  We explored the old fusion by removing the caps from the pedicle screws at L3-4 bilaterally, then removed the rods.  We inspected the arthrodesis.  It appeared solid at L3-4  I began the decompression by using the high speed drill to perform laminotomies at L4-5 bilaterally. We then used the Kerrison punches to widen the laminotomy and removed the ligamentum flavum at L4-5 bilaterally. We used the Kerrison punches to remove the medial facets at L4-5 bilaterally. We performed wide foraminotomies about the bilateral L4 and L5 nerve roots completing the decompression.  We now turned our attention to the posterior lumbar interbody fusion. I used a scalpel to incise the intervertebral disc at L4-5 bilaterally. I then performed a partial intervertebral discectomy at L4-5 bilaterally using the pituitary forceps. We prepared the vertebral endplates at T6-5 bilaterally for the fusion by removing the soft tissues with the curettes. We then used the trial spacers to pick the appropriate sized interbody prosthesis. We prefilled his prosthesis with a combination of local morselized autograft bone that we obtained during the decompression as well as Kinnex bone graft extender. We inserted the prefilled prosthesis into the interspace at L4-5 from the right, we then expanded the prosthesis. There was a good snug fit of the prosthesis in the interspace.  We then filled and the remainder of  the intervertebral disc space with local morselized autograft bone and Kinnex. This completed the posterior lumbar interbody arthrodesis.  We now turned attention to the instrumentation. Under fluoroscopic guidance we cannulated the bilateral L5 pedicles with the bone probe. We then removed the bone probe. We then tapped the pedicle with a 6.5 millimeter tap. We then removed the tap. We probed inside the tapped pedicle with a ball probe to rule out cortical breaches. We then inserted a 7.5 x 55 millimeter pedicle screw into the L5 pedicles bilaterally under fluoroscopic guidance. We then palpated along the medial aspect of the pedicles to rule out cortical breaches. There were none. The nerve roots were not injured. We then connected the unilateral pedicle screws from L3-L5 with a rod. We compressed the construct and secured the rod in place with the caps. We then tightened the caps appropriately. This completed the instrumentation from L3-L5 bilaterally.  We now turned our attention to the posterior lateral arthrodesis at L4-5 bilaterally. We used the high-speed drill to decorticate the remainder of the facets, pars, transverse process at L4-5 bilaterally. We then applied a combination of local morselized autograft bone and Kinnex bone graft extender over these decorticated posterior lateral structures. This completed the posterior lateral arthrodesis.  We then obtained hemostasis using bipolar electrocautery. We irrigated the wound out with bacitracin solution. We inspected the thecal sac and nerve roots and noted they were well decompressed. We then removed the retractor. We placed vancomycin powder in the wound. We reapproximated patient's thoracolumbar fascia with interrupted #1 Vicryl suture. We reapproximated patient's subcutaneous tissue with interrupted 2-0 Vicryl suture. The reapproximated patient's skin with Steri-Strips and benzoin. The wound was then coated with bacitracin ointment. A sterile  dressing was applied. The drapes were removed. The patient was subsequently returned to the supine position where they were extubated by the anesthesia team. He was then transported to the post anesthesia care unit in stable condition. All sponge instrument and needle counts were reportedly correct at the end of this case.

## 2017-10-30 NOTE — Transfer of Care (Signed)
Immediate Anesthesia Transfer of Care Note  Patient: [name]  Procedure(s) Performed: POSTERIOR LUMBAR INTERBODY FUSION, INTERBODY PROSTHESIS, POSTERIOR INSTRUMENTATION AND FUSION LUMBAR FOUR- LUMBAR FIVE; EXPLORE LUMBAR FUSION (N/A Back)  Patient Location: PACU  Anesthesia Type:General  Level of Consciousness: patient cooperative and responds to stimulation  Airway & Oxygen Therapy: Patient Spontanous Breathing and Patient connected to nasal cannula oxygen  Post-op Assessment: Report given to RN, Post -op Vital signs reviewed and stable and Patient moving all extremities X 4  Post vital signs: Reviewed and stable  Last Vitals:  Vitals Value Taken Time  BP 186/95 10/30/2017  2:11 PM  Temp    Pulse 102 10/30/2017  2:11 PM  Resp 15 10/30/2017  2:11 PM  SpO2 96 % 10/30/2017  2:11 PM  Vitals shown include unvalidated device data.  Last Pain:  Vitals:   10/30/17 0816  TempSrc: Oral         Complications: No apparent anesthesia complications

## 2017-10-30 NOTE — Anesthesia Postprocedure Evaluation (Signed)
Anesthesia Post Note  Patient: [name]  Procedure(s) Performed: POSTERIOR LUMBAR INTERBODY FUSION, INTERBODY PROSTHESIS, POSTERIOR INSTRUMENTATION AND FUSION LUMBAR FOUR- LUMBAR FIVE; EXPLORE LUMBAR FUSION (N/A Back)     Patient location during evaluation: PACU Anesthesia Type: General Level of consciousness: awake and alert Pain management: pain level controlled Vital Signs Assessment: post-procedure vital signs reviewed and stable Respiratory status: spontaneous breathing, nonlabored ventilation, respiratory function stable and patient connected to nasal cannula oxygen Cardiovascular status: blood pressure returned to baseline and stable Postop Assessment: no apparent nausea or vomiting Anesthetic complications: no    Last Vitals:  Vitals:   10/30/17 1505 10/30/17 1507  BP:    Pulse: 79 89  Resp: 15 17  Temp:    SpO2: 93% 91%     LLE Motor Response: Purposeful movement;Responds to commands (10/30/17 1500) LLE Sensation: No numbness;No tingling (10/30/17 1500) RLE Motor Response: Purposeful movement;Responds to commands (10/30/17 1500) RLE Sensation: No numbness;No tingling (10/30/17 1500)      Audry Pili

## 2017-10-30 NOTE — H&P (Signed)
Subjective: The patient is a 57 year old white male whose had multiple back surgeries.  Most recently he has had a lumbar instrumentation and fusion at L3-4 by Dr. Joya Salm in March 2010.  He is developed recurrent back and leg pain.  He has failed medical management.  He was worked up with a lumbar MRI and lumbar x-rays which demonstrated L4-5 degenerative disc disease, stenosis, etc.  I discussed the various treatment options with the patient.  He has decided to proceed with surgery.  Past Medical History:  Diagnosis Date  . Arthritis    "some; in hips sometimes" (01/02/2013)  . Back pain    "w/prolonged walking or standing" (01/02/2013)  . CAD (coronary artery disease)    CABG X 4 08/2012  . COPD (chronic obstructive pulmonary disease) ([place])   . Esophageal erosions 05/24/01   egd by Dr. Gala Romney  . GERD (gastroesophageal reflux disease)   . H/O hiatal hernia   . History of gout   . Hyperlipidemia   . Hypertension    not on any medication for htn at present  . PAD (peripheral artery disease) ([place])    LEA DOPPLER, 11/11/2012 - moderate arterial insufficiency to lower extremities at rest, BILATERAL SFA-demonstrates occlusive disease with reconstitution of flow  . Tobacco abuse   . Tubular adenoma of colon 08/28/11  . Type II diabetes mellitus ([place])   . Unintentional weight loss     Past Surgical History:  Procedure Laterality Date  . ANTERIOR CERVICAL DECOMP/DISCECTOMY FUSION  12/19/2011   Procedure: ANTERIOR CERVICAL DECOMPRESSION/DISCECTOMY FUSION 2 LEVELS;  Surgeon: Floyce Stakes, MD;  Location: [facility];  Service: Neurosurgery;  Laterality: N/A;  Cervical four-five Cervical five-six  Anterior cervical decompression/diskectomy, fusion, plate  . ATHERECTOMY N/A 01/02/2013   Procedure: ATHERECTOMY;  Surgeon: Lorretta Harp, MD;  Location: [facility] CATH LAB;  Service: Cardiovascular;  Laterality: N/A;  . BACK SURGERY     x3  . CARDIAC CATHETERIZATION  08/19/2012   CABG warranted; Severe  ostial LCx stenosis with mid to distal vessel occlusion  . COLONOSCOPY W/ POLYPECTOMY  08/28/11   Rourk-tubular adenomas removed from ascending colon, suboptimal prep, diminutive rectal polyps  . COLONOSCOPY WITH PROPOFOL N/A 06/13/2017   Procedure: COLONOSCOPY WITH PROPOFOL;  Surgeon: Daneil Dolin, MD;  Location: [facility];  Service: Endoscopy;  Laterality: N/A;  9:15am  . CORONARY ARTERY BYPASS GRAFT  08/22/2012   Procedure: CORONARY ARTERY BYPASS GRAFTING (CABG);  Surgeon: Grace Isaac, MD;  Location: [place];  Service: Open Heart Surgery;  Laterality: N/A;  CABG x four,  using left internal mammary artery and right leg greater saphenous vein harvested endoscopically  . ESOPHAGOGASTRODUODENOSCOPY  05/24/01   by Dr. Gala Romney  . ESOPHAGOGASTRODUODENOSCOPY  08/28/11   Rourk-erosive reflux esophagitis, gastric erosions  . ESOPHAGOGASTRODUODENOSCOPY (EGD) WITH PROPOFOL N/A 06/13/2017   Procedure: ESOPHAGOGASTRODUODENOSCOPY (EGD) WITH PROPOFOL;  Surgeon: Daneil Dolin, MD;  Location: [facility];  Service: Endoscopy;  Laterality: N/A;  . INTRAOPERATIVE TRANSESOPHAGEAL ECHOCARDIOGRAM  08/22/2012   Procedure: INTRAOPERATIVE TRANSESOPHAGEAL ECHOCARDIOGRAM;  Surgeon: Grace Isaac, MD;  Location: [place];  Service: Open Heart Surgery;  Laterality: N/A;  . LEFT HEART CATHETERIZATION WITH CORONARY ANGIOGRAM N/A 08/19/2012   Procedure: LEFT HEART CATHETERIZATION WITH CORONARY ANGIOGRAM;  Surgeon: [name], MD;  Location: [facility] CATH LAB;  Service: Cardiovascular;  Laterality: N/A;  . LOWER EXTREMITY ANGIOGRAM N/A 11/17/2012   Procedure: LOWER EXTREMITY ANGIOGRAM;  Surgeon: Lorretta Harp, MD;  Location: [facility] CATH  LAB;  Service: Cardiovascular;  Laterality: N/A;  . LOWER EXTREMITY ANGIOGRAM N/A 02/02/2013   Procedure: LOWER EXTREMITY ANGIOGRAM;  Surgeon: Lorretta Harp, MD;  Location: [facility] CATH LAB;  Service: Cardiovascular;  Laterality: N/A;  . LUMBAR [place]    . LUMBAR FUSION  ~ 2011  .  PELVIC ABCESS DRAINAGE     x2  . POLYPECTOMY  06/13/2017   Procedure: POLYPECTOMY;  Surgeon: Daneil Dolin, MD;  Location: [facility];  Service: Endoscopy;;  colon  . SHOULDER SURGERY Right   . VASCULAR SURGERY Right 01/02/2013   diamondback orbital rotational  atherectomy, chocolate balloon and IDEV  Stent.    No Known Allergies  Social History   Tobacco Use  . Smoking status: Current Every Day Smoker    Packs/day: 0.50    Years: 39.00    Pack years: 19.50    Types: Cigarettes  . Smokeless tobacco: Never Used  Substance Use Topics  . Alcohol use: Yes    Alcohol/week: 3.6 oz    Types: 6 Cans of beer per week    Family History  Problem Relation Age of Onset  . Anesthesia problems Neg Hx   . Colon cancer Neg Hx   . Gastric cancer Neg Hx   . Esophageal cancer Neg Hx    Prior to Admission medications   Medication Sig Start Date End Date Taking? Authorizing Provider  clopidogrel (PLAVIX) 75 MG tablet Take 1 tablet (75 mg total) by mouth daily with breakfast. 05/03/15  Yes Hilty, Nadean Corwin, MD  insulin aspart (NOVOLOG) 100 UNIT/ML injection Inject 10-25 Units into the skin See admin instructions. 10-15 units in the morning, 25 units at bedtime   Yes [provider]  metoprolol succinate (TOPROL-XL) 25 MG 24 hr tablet Take 1 tablet (25 mg total) by mouth daily. 10/11/17  Yes Hilty, Nadean Corwin, MD  oxyCODONE-acetaminophen (PERCOCET/ROXICET) 5-325 MG per tablet Take 1 tablet by mouth every 4 (four) hours as needed for pain. For pain Patient taking differently: Take 1 tablet by mouth every 6 (six) hours as needed for moderate pain.  08/25/12  Yes Lars Pinks M, PA-C  pantoprazole (PROTONIX) 40 MG tablet Take 1 tablet (40 mg total) by mouth daily. 05/16/17  Yes Carlis Stable, NP  rosuvastatin (CRESTOR) 20 MG tablet Take 20 mg by mouth daily.   Yes [provider]  zolpidem (AMBIEN CR) 12.5 MG CR tablet Take 12.5 mg by mouth at bedtime as needed for sleep.  09/19/15   Yes [provider]  PROAIR HFA 108 (90 BASE) MCG/ACT inhaler Inhale 2 puffs into the lungs every 6 (six) hours as needed for wheezing or shortness of breath.  08/14/13   [provider]     Review of Systems  Positive ROS: As above  All other systems have been reviewed and were otherwise negative with the exception of those mentioned in the HPI and as above.  Objective: Vital signs in last 24 hours: Temp:  [97.7 F (36.5 C)] 97.7 F (36.5 C) (03/27 0816) Pulse Rate:  [72] 72 (03/27 0816) Resp:  [20] 20 (03/27 0816) BP: (167)/(84) 167/84 (03/27 0816) SpO2:  [98 %] 98 % (03/27 0816) Weight:  [80.7 kg (178 lb)] 80.7 kg (178 lb) (03/27 0823) Estimated body mass index is 24.83 kg/m as calculated from the following:   Height as of this encounter: 5\' 11"  (1.803 m).   Weight as of this encounter: 80.7 kg (178 lb).   General Appearance: Alert  Head: Normocephalic, without obvious abnormality, atraumatic Eyes: PERRL, conjunctiva/corneas clear, EOM's intact,    Ears: Normal  Throat: Normal  Neck: Supple, the patient's cervical incision is well-healed. Back: unremarkable.  The patient's lumbar incision is well-healed. Lungs: Clear to auscultation bilaterally, respirations unlabored Heart: Regular rate and rhythm, no murmur, rub or gallop Abdomen: Soft, non-tender Extremities: Extremities normal, atraumatic, no cyanosis or edema Skin: unremarkable  NEUROLOGIC:   Mental status: alert and oriented,Motor Exam - grossly normal Sensory Exam - grossly normal Reflexes:  Coordination - grossly normal Gait - grossly normal Balance - grossly normal Cranial Nerves: I: smell Not tested  II: visual acuity  OS: Normal  OD: Normal   II: visual fields Full to confrontation  II: pupils Equal, round, reactive to light  III,VII: ptosis None  III,IV,VI: extraocular muscles  Full ROM  V: mastication Normal  V: facial light touch sensation  Normal  V,VII: corneal reflex  Present   VII: facial muscle function - upper  Normal  VII: facial muscle function - lower Normal  VIII: hearing Not tested  IX: soft palate elevation  Normal  IX,X: gag reflex Present  XI: trapezius strength  5/5  XI: sternocleidomastoid strength 5/5  XI: neck flexion strength  5/5  XII: tongue strength  Normal    Data Review Lab Results  Component Value Date   WBC 8.5 10/23/2017   HGB 15.9 10/23/2017   HCT 46.0 10/23/2017   MCV 91.8 10/23/2017   PLT 144 (L) 10/23/2017   Lab Results  Component Value Date   NA 135 10/23/2017   K 4.3 10/23/2017   CL 101 10/23/2017   CO2 23 10/23/2017   BUN 13 10/23/2017   CREATININE 0.76 10/23/2017   GLUCOSE 256 (H) 10/23/2017   Lab Results  Component Value Date   INR 0.94 02/02/2013    Assessment/Plan: L4-5 degenerative disc disease, foraminal stenosis, lumbago, lumbar radiculopathy: I have discussed the situation with the patient.  I have reviewed his imaging studies with him and pointed out the abnormalities.  We have discussed the various treatment options including surgery.  I have described the surgical treatment option of an exploration of lumbar fusion with an L4-5 decompression, instrumentation, and fusion.  I have shown him surgical models.  I have given him a surgical pamphlet.  We have discussed the risks, benefits, alternatives, expected postoperative course, and likelihood of achieving our goals with surgery.  I have answered all his questions.  He has decided to proceed with surgery.   Ophelia Charter 10/30/2017 9:58 AM

## 2017-10-31 LAB — CBC
HEMATOCRIT: 39.3 % (ref 39.0–52.0)
HEMOGLOBIN: 13 g/dL (ref 13.0–17.0)
MCH: 30.7 pg (ref 26.0–34.0)
MCHC: 33.1 g/dL (ref 30.0–36.0)
MCV: 92.7 fL (ref 78.0–100.0)
Platelets: 187 10*3/uL (ref 150–400)
RBC: 4.24 MIL/uL (ref 4.22–5.81)
RDW: 12.2 % (ref 11.5–15.5)
WBC: 15.1 10*3/uL — ABNORMAL HIGH (ref 4.0–10.5)

## 2017-10-31 LAB — BASIC METABOLIC PANEL
Anion gap: 11 (ref 5–15)
BUN: 15 mg/dL (ref 6–20)
CALCIUM: 9.2 mg/dL (ref 8.9–10.3)
CHLORIDE: 101 mmol/L (ref 101–111)
CO2: 24 mmol/L (ref 22–32)
CREATININE: 1 mg/dL (ref 0.61–1.24)
GFR calc Af Amer: >60 mL/min (ref >60)
GFR calc non Af Amer: >60 mL/min (ref >60)
GLUCOSE: 128 mg/dL — AB (ref 65–99)
Potassium: 4 mmol/L (ref 3.5–5.1)
Sodium: 136 mmol/L (ref 135–145)

## 2017-10-31 LAB — GLUCOSE, CAPILLARY: Glucose-Capillary: 141 mg/dL — ABNORMAL HIGH (ref 65–99)

## 2017-10-31 MED ORDER — DOCUSATE SODIUM 100 MG PO CAPS: 100.0000 mg | ORAL_CAPSULE | Freq: Two times a day (BID) | ORAL | 0 refills | Status: DC

## 2017-10-31 MED ORDER — OXYCODONE HCL 5 MG PO TABS: 5.0000 mg | ORAL_TABLET | ORAL | 0 refills | Status: DC | PRN

## 2017-10-31 MED ORDER — CYCLOBENZAPRINE HCL 10 MG PO TABS: 10.0000 mg | ORAL_TABLET | Freq: Three times a day (TID) | ORAL | 1 refills | Status: DC | PRN

## 2017-10-31 MED FILL — Thrombin For Soln 5000 Unit: CUTANEOUS | Qty: 5000 | Status: AC

## 2017-10-31 MED FILL — Heparin Sodium (Porcine) Inj 1000 Unit/ML: INTRAMUSCULAR | Qty: 30 | Status: AC

## 2017-10-31 MED FILL — Sodium Chloride IV Soln 0.9%: INTRAVENOUS | Qty: 1000 | Status: AC

## 2017-10-31 NOTE — Evaluation (Signed)
Physical Therapy Evaluation Patient Details Name: [name] MRN: [id number] DOB: 10/27/1960 Today's Date: 10/31/2017   History of Present Illness  Pt is a 57 y/o male who presents s/p L4-L5 PLIF on 10/30/17. PMH significant for DMII, PAD, HTN, gout, hiatal hernia, CAD. He has had several prior spinal surgeries.   Clinical Impression  Patient evaluated by Physical Therapy with no further acute PT needs identified. All education has been completed and the patient has no further questions. At the time of PT eval pt was able to perform transfers and ambulation with gross modified independence. Overall, pt mobilizing well however has poor recall of precautions. Even with education and practice, pt immediately reverts back to breaking precautions when therapist is not cueing. Pt also reporting that he knows what he has to do without therapist's help since he's been through this all many times. He anticipates d/c home today. See below for any follow-up Physical Therapy or equipment needs. PT is signing off. Thank you for this referral.   Follow Up Recommendations No PT follow up;Supervision - Intermittent    Equipment Recommendations  None recommended by PT    Recommendations for Other Services       Precautions / Restrictions Precautions Precautions: Back Precaution Booklet Issued: Yes (comment)(Present in room from OT session) Precaution Comments: Pt was cued for precautions during functional mobility.  Restrictions Weight Bearing Restrictions: No      Mobility  Bed Mobility Overal bed mobility: Modified Independent Bed Mobility: Rolling;Sidelying to Sit;Sit to Sidelying Rolling: Modified independent (Device/Increase time) Sidelying to sit: Modified independent (Device/Increase time)     Sit to sidelying: Modified independent (Device/Increase time) General bed mobility comments: VC's for proper log roll technique. Pt able to perform with mod I however has difficulty maintaining due  to poor recall.   Transfers Overall transfer level: Modified independent Equipment used: None Transfers: Sit to/from Stand           General transfer comment: VC's for maintenance of precautions. No assist required.   Ambulation/Gait Ambulation/Gait assistance: Modified independent (Device/Increase time) Ambulation Distance (Feet): 400 Feet Assistive device: None Gait Pattern/deviations: WFL(Within Functional Limits) Gait velocity: Decreased Gait velocity interpretation: Below normal speed for age/gender General Gait Details: Pt ambulating well with very minimal deviation in gait. Overall [facility]  Stairs            Wheelchair Mobility    Modified Rankin (Stroke Patients Only)       Balance Overall balance assessment: Needs assistance Sitting-balance support: Feet supported;No upper extremity supported Sitting balance-Leahy Scale: Good     Standing balance support: No upper extremity supported;During functional activity Standing balance-Leahy Scale: Good                               Pertinent Vitals/Pain Pain Assessment: Faces Faces Pain Scale: Hurts a little bit Pain Location: Incision site Pain Descriptors / Indicators: Operative site guarding Pain Intervention(s): Limited activity within patient's tolerance;Monitored during session;Repositioned    Home Living Family/patient expects to be discharged to:: Private residence Living Arrangements: Alone Available Help at Discharge: Friend(s) Type of Home: Apartment Home Access: Level entry     Home Layout: One level Home Equipment: Cane - single point Additional Comments: Pt reports he has lost his walker from prior surgery    Prior Function Level of Independence: Independent               Hand Dominance  Extremity/Trunk Assessment   Upper Extremity Assessment Upper Extremity Assessment: Defer to OT evaluation    Lower Extremity Assessment Lower Extremity Assessment:  Overall WFL for tasks assessed    Cervical / Trunk Assessment Cervical / Trunk Assessment: Other exceptions Cervical / Trunk Exceptions: s/p surgery  Communication   Communication: No difficulties  Cognition Arousal/Alertness: Awake/alert Behavior During Therapy: WFL for tasks assessed/performed Overall Cognitive Status: Within Functional Limits for tasks assessed                                        General Comments      Exercises     Assessment/Plan    PT Assessment Patent does not need any further PT services  PT Problem List         PT Treatment Interventions      PT Goals (Current goals can be found in the Care Plan section)  Acute Rehab PT Goals Patient Stated Goal: Home today PT Goal Formulation: All assessment and education complete, DC therapy    Frequency     Barriers to discharge        Co-evaluation               AM-PAC PT "6 Clicks" Daily Activity  Outcome Measure Difficulty turning over in bed (including adjusting bedclothes, sheets and blankets)?: None Difficulty moving from lying on back to sitting on the side of the bed? : None Difficulty sitting down on and standing up from a chair with arms (e.g., wheelchair, bedside commode, etc,.)?: None Help needed moving to and from a bed to chair (including a wheelchair)?: None Help needed walking in hospital room?: None Help needed climbing 3-5 steps with a railing? : A Little 6 Click Score: 23    End of Session Equipment Utilized During Treatment: Back brace Activity Tolerance: Patient tolerated treatment well Patient left: in bed;with call bell/phone within reach Nurse Communication: Mobility status PT Visit Diagnosis: Unsteadiness on feet (R26.81);Pain Pain - part of body: (back)    Time: 4825-0037 PT Time Calculation (min) (ACUTE ONLY): 11 min   Charges:   PT Evaluation $PT Eval Low Complexity: 1 Low     PT G Codes:        Rolinda Roan, PT, DPT Acute  Rehabilitation Services Pager: [phone]   Thelma Comp 10/31/2017, 9:45 AM

## 2017-10-31 NOTE — Progress Notes (Signed)
Occupational Therapy Evaluation Patient Details Name: [name] MRN: [id number] DOB: [birth year] Today's Date: 10/31/2017    History of Present Illness Pt is a 57 y/o male who presents s/p L4-L5 PLIF on 10/30/17. PMH significant for DMII, PAD, HTN, gout, hiatal hernia, CAD. He has had several prior spinal surgeries.    Clinical Impression   Completed all education regarding ADL management following back precautions. Pt safe to DC home when medically stable.     Follow Up Recommendations  No OT follow up    Equipment Recommendations  None recommended by OT    Recommendations for Other Services       Precautions / Restrictions Precautions Precautions: Back Precaution Booklet Issued: Yes (comment)(Present in room from OT session) Precaution Comments: Pt was cued for precautions during functional mobility ADN adl Restrictions Weight Bearing Restrictions: No      Mobility Bed Mobility Overal bed mobility: Modified Independent Bed Mobility: Rolling;Sidelying to Sit;Sit to Sidelying Rolling: Modified independent (Device/Increase time) Sidelying to sit: Modified independent (Device/Increase time)     Sit to sidelying: Modified independent (Device/Increase time) General bed mobility comments: VC's for proper log roll technique. Pt able to perform with mod I however has difficulty maintaining due to poor recall.   Transfers Overall transfer level: Modified independent Equipment used: None Transfers: Sit to/from Stand           General transfer comment: VC's for maintenance of precautions. No assist required.     Balance Overall balance assessment: Needs assistance Sitting-balance support: Feet supported;No upper extremity supported Sitting balance-Leahy Scale: Good     Standing balance support: No upper extremity supported;During functional activity Standing balance-Leahy Scale: Good                             ADL either performed or assessed with  clinical judgement   ADL Overall ADL's : Needs assistance/impaired                                     Functional mobility during ADLs: Modified independent General ADL Comments: Completed all education regarding ADL and functional mobility for ADL. Pt requires repeated cues to not bend or twist. Pt states " I know how to do this"     Vision         Perception     Praxis      Pertinent Vitals/Pain Pain Assessment: Faces Faces Pain Scale: Hurts a little bit Pain Location: Incision site Pain Descriptors / Indicators: Operative site guarding Pain Intervention(s): Limited activity within patient's tolerance     Hand Dominance Right   Extremity/Trunk Assessment Upper Extremity Assessment Upper Extremity Assessment: Overall WFL for tasks assessed   Lower Extremity Assessment Lower Extremity Assessment: Defer to PT evaluation   Cervical / Trunk Assessment Cervical / Trunk Assessment: Other exceptions Cervical / Trunk Exceptions: s/p surgery   Communication Communication Communication: No difficulties   Cognition Arousal/Alertness: Awake/alert Behavior During Therapy: WFL for tasks assessed/performed Overall Cognitive Status: Within Functional Limits for tasks assessed                                     General Comments       Exercises     Shoulder Instructions      Home Living Family/patient expects to  be discharged to:: Private residence Living Arrangements: Alone Available Help at Discharge: Friend(s) Type of Home: Apartment Home Access: Level entry     Home Layout: One level     Bathroom Shower/Tub: Teacher, early years/pre: [place] - single point   Additional Comments: Pt reports he has lost his walker from prior surgery      Prior Functioning/Environment Level of Independence: Independent                 OT Problem List: Impaired balance (sitting and/or  standing);Decreased safety awareness;Decreased knowledge of use of DME or AE;Decreased knowledge of precautions;Pain      OT Treatment/Interventions:      OT Goals(Current goals can be found in the care plan section) Acute Rehab OT Goals Patient Stated Goal: Home today OT Goal Formulation: All assessment and education complete, DC therapy  OT Frequency:     Barriers to D/C:            Co-evaluation              AM-PAC PT "6 Clicks" Daily Activity     Outcome Measure Help from another person eating meals?: None Help from another person taking care of personal grooming?: None Help from another person toileting, which includes using toliet, bedpan, or urinal?: None Help from another person bathing (including washing, rinsing, drying)?: None Help from another person to put on and taking off regular upper body clothing?: None Help from another person to put on and taking off regular lower body clothing?: None 6 Click Score: 24   End of Session Nurse Communication: Mobility status  Activity Tolerance: Patient tolerated treatment well Patient left: in bed;with call bell/phone within reach  OT Visit Diagnosis: Muscle weakness (generalized) (M62.81);Pain Pain - part of body: (back)                Time: 5110-2111 OT Time Calculation (min): 12 min Charges:  OT General Charges $OT Visit: 1 Visit OT Evaluation $OT Eval Low Complexity: 1 Low G-Codes:     [place], OT/L  [phone] 10/31/2017  Koryn Charlot,HILLARY 10/31/2017, 10:55 AM

## 2017-10-31 NOTE — Discharge Summary (Signed)
Physician Discharge Summary  Patient ID: [name] MRN: [id number] DOB/AGE: 1961/03/23 57 y.o.  Admit date: 10/30/2017 Discharge date: 10/31/2017  Admission Diagnoses: L4-5 foraminal stenosis, lumbago, lumbar radiculopathy, neurogenic claudication  Discharge Diagnoses: The same Active Problems:   Lumbar stenosis with neurogenic claudication   Discharged Condition: good  Hospital Course: I performed an L4-5 decompression instrumentation and fusion with an exploration of his previous fusion at L3-4 on 10/30/2017.  The surgery went well.  The patient's postoperative course was unremarkable.  On postoperative day #1 he requested discharge to home.  He was given written and oral discharge instructions.  All his questions were answered.  Consults: Physical therapy Significant Diagnostic Studies: None Treatments: L4-5 decompression, instrumentation, fusion, exploration of lumbar fusion Discharge Exam: Blood pressure 109/63, pulse (!) 56, temperature 98.2 F (36.8 C), resp. rate 18, height 5\' 11"  (1.803 m), weight 80.7 kg (178 lb), SpO2 95 %. The patient is alert and pleasant.  He looks well.  His strength is normal in his lower extremities.  Disposition: Home  Discharge Instructions    Call MD for:  difficulty breathing, headache or visual disturbances   Complete by:  As directed    Call MD for:  extreme fatigue   Complete by:  As directed    Call MD for:  hives   Complete by:  As directed    Call MD for:  persistant dizziness or light-headedness   Complete by:  As directed    Call MD for:  persistant nausea and vomiting   Complete by:  As directed    Call MD for:  redness, tenderness, or signs of infection (pain, swelling, redness, odor or green/yellow discharge around incision site)   Complete by:  As directed    Call MD for:  severe uncontrolled pain   Complete by:  As directed    Call MD for:  temperature >100.4   Complete by:  As directed    Diet - low sodium heart  healthy   Complete by:  As directed    Discharge instructions   Complete by:  As directed    Call [phone] for a followup appointment. Take a stool softener while you are using pain medications.   Driving Restrictions   Complete by:  As directed    Do not drive for 2 weeks.   Increase activity slowly   Complete by:  As directed    Lifting restrictions   Complete by:  As directed    Do not lift more than 5 pounds. No excessive bending or twisting.   May shower / Bathe   Complete by:  As directed    Remove the dressing for 3 days after surgery.  You may shower, but leave the incision alone.   Remove dressing in 48 hours   Complete by:  As directed    Your stitches are under the scan and will dissolve by themselves. The Steri-Strips will fall off after you take a few showers. Do not rub back or pick at the wound, Leave the wound alone.     Allergies as of 10/31/2017   No Known Allergies     Medication List    STOP taking these medications   oxyCODONE-acetaminophen 5-325 MG tablet Commonly known as:  PERCOCET/ROXICET     TAKE these medications   clopidogrel 75 MG tablet Commonly known as:  PLAVIX Take 1 tablet (75 mg total) by mouth daily with breakfast.   cyclobenzaprine 10 MG tablet Commonly known as:  FLEXERIL Take 1 tablet (10 mg total) by mouth 3 (three) times daily as needed for muscle spasms.   docusate sodium 100 MG capsule Commonly known as:  COLACE Take 1 capsule (100 mg total) by mouth 2 (two) times daily.   insulin aspart 100 UNIT/ML injection Commonly known as:  novoLOG Inject 10-25 Units into the skin See admin instructions. 10-15 units in the morning, 25 units at bedtime   metoprolol succinate 25 MG 24 hr tablet Commonly known as:  TOPROL-XL Take 1 tablet (25 mg total) by mouth daily.   oxyCODONE 5 MG immediate release tablet Commonly known as:  Oxy IR/ROXICODONE Take 1 tablet (5 mg total) by mouth every 4 (four) hours as needed for moderate pain  ((score 4 to 6)).   pantoprazole 40 MG tablet Commonly known as:  PROTONIX Take 1 tablet (40 mg total) by mouth daily.   PROAIR HFA 108 (90 Base) MCG/ACT inhaler Generic drug:  albuterol Inhale 2 puffs into the lungs every 6 (six) hours as needed for wheezing or shortness of breath.   rosuvastatin 20 MG tablet Commonly known as:  CRESTOR Take 20 mg by mouth daily.   zolpidem 12.5 MG CR tablet Commonly known as:  AMBIEN CR Take 12.5 mg by mouth at bedtime as needed for sleep.      Follow-up Information    Newman Pies, MD Follow up.   Specialty:  Neurosurgery Contact information: [address] [place] [address] [place] [address] [phone]           Signed: Ophelia Charter 10/31/2017, 8:51 AM

## 2017-10-31 NOTE — Progress Notes (Signed)
Patient alert and oriented, mae's well, voiding adequate amount of urine, swallowing without difficulty, c/o mild pain at time of discharge. Patient discharged home with family. Script and discharged instructions given to patient. Patient and family stated understanding of instructions given. Patient has an appointment with Dr. Jenkins 

## 2017-11-15 DIAGNOSIS — M5416 Radiculopathy, lumbar region: Secondary | ICD-10-CM | POA: Diagnosis not present

## 2017-11-27 DIAGNOSIS — M545 Low back pain: Secondary | ICD-10-CM | POA: Diagnosis not present

## 2017-11-27 DIAGNOSIS — J449 Chronic obstructive pulmonary disease, unspecified: Secondary | ICD-10-CM | POA: Diagnosis not present

## 2017-11-27 DIAGNOSIS — E1165 Type 2 diabetes mellitus with hyperglycemia: Secondary | ICD-10-CM | POA: Diagnosis not present

## 2017-11-27 DIAGNOSIS — I2510 Atherosclerotic heart disease of native coronary artery without angina pectoris: Secondary | ICD-10-CM | POA: Diagnosis not present

## 2018-01-27 DIAGNOSIS — I10 Essential (primary) hypertension: Secondary | ICD-10-CM | POA: Diagnosis not present

## 2018-01-27 DIAGNOSIS — E119 Type 2 diabetes mellitus without complications: Secondary | ICD-10-CM | POA: Diagnosis not present

## 2018-01-27 DIAGNOSIS — I2510 Atherosclerotic heart disease of native coronary artery without angina pectoris: Secondary | ICD-10-CM | POA: Diagnosis not present

## 2018-01-27 DIAGNOSIS — J449 Chronic obstructive pulmonary disease, unspecified: Secondary | ICD-10-CM | POA: Diagnosis not present

## 2018-03-04 DIAGNOSIS — I10 Essential (primary) hypertension: Secondary | ICD-10-CM | POA: Diagnosis not present

## 2018-03-04 DIAGNOSIS — I2510 Atherosclerotic heart disease of native coronary artery without angina pectoris: Secondary | ICD-10-CM | POA: Diagnosis not present

## 2018-03-04 DIAGNOSIS — E1165 Type 2 diabetes mellitus with hyperglycemia: Secondary | ICD-10-CM | POA: Diagnosis not present

## 2018-03-04 DIAGNOSIS — J449 Chronic obstructive pulmonary disease, unspecified: Secondary | ICD-10-CM | POA: Diagnosis not present

## 2018-03-19 ENCOUNTER — Other Ambulatory Visit: Payer: Self-pay

## 2018-03-19 NOTE — Patient Outreach (Signed)
[name]  03/19/2018  [name] [birth year] [id number]   Medication Adherence call to [name] left a message for patient to call back patient is due on Rosuvastatin 20 mg. [name] is showing past due under [name].   [place] Management Direct Dial [phone]  Fax [phone] [email or web address]

## 2018-04-03 DIAGNOSIS — Z79891 Long term (current) use of opiate analgesic: Secondary | ICD-10-CM | POA: Diagnosis not present

## 2018-04-21 DIAGNOSIS — E1165 Type 2 diabetes mellitus with hyperglycemia: Secondary | ICD-10-CM | POA: Diagnosis not present

## 2018-04-21 DIAGNOSIS — Z23 Encounter for immunization: Secondary | ICD-10-CM | POA: Diagnosis not present

## 2018-04-21 DIAGNOSIS — I2510 Atherosclerotic heart disease of native coronary artery without angina pectoris: Secondary | ICD-10-CM | POA: Diagnosis not present

## 2018-04-21 DIAGNOSIS — J449 Chronic obstructive pulmonary disease, unspecified: Secondary | ICD-10-CM | POA: Diagnosis not present

## 2018-04-21 DIAGNOSIS — M545 Low back pain: Secondary | ICD-10-CM | POA: Diagnosis not present

## 2018-04-22 DIAGNOSIS — M5136 Other intervertebral disc degeneration, lumbar region: Secondary | ICD-10-CM | POA: Diagnosis not present

## 2018-04-22 DIAGNOSIS — M4800 Spinal stenosis, site unspecified: Secondary | ICD-10-CM | POA: Diagnosis not present

## 2018-07-01 DIAGNOSIS — M545 Low back pain: Secondary | ICD-10-CM | POA: Diagnosis not present

## 2018-07-01 DIAGNOSIS — I2510 Atherosclerotic heart disease of native coronary artery without angina pectoris: Secondary | ICD-10-CM | POA: Diagnosis not present

## 2018-07-01 DIAGNOSIS — E1165 Type 2 diabetes mellitus with hyperglycemia: Secondary | ICD-10-CM | POA: Diagnosis not present

## 2018-07-01 DIAGNOSIS — J449 Chronic obstructive pulmonary disease, unspecified: Secondary | ICD-10-CM | POA: Diagnosis not present

## 2018-07-23 DIAGNOSIS — I2510 Atherosclerotic heart disease of native coronary artery without angina pectoris: Secondary | ICD-10-CM | POA: Diagnosis not present

## 2018-07-23 DIAGNOSIS — M545 Low back pain: Secondary | ICD-10-CM | POA: Diagnosis not present

## 2018-07-23 DIAGNOSIS — J441 Chronic obstructive pulmonary disease with (acute) exacerbation: Secondary | ICD-10-CM | POA: Diagnosis not present

## 2018-07-23 DIAGNOSIS — E1165 Type 2 diabetes mellitus with hyperglycemia: Secondary | ICD-10-CM | POA: Diagnosis not present

## 2018-07-24 ENCOUNTER — Ambulatory Visit (HOSPITAL_COMMUNITY)
Admission: RE | Admit: 2018-07-24 | Discharge: 2018-07-24 | Disposition: A | Discharge status: Home / Self Care | Payer: Medicare Other | Source: Ambulatory Visit | Attending: Pulmonary Disease | Admitting: Pulmonary Disease

## 2018-07-24 ENCOUNTER — Other Ambulatory Visit (HOSPITAL_COMMUNITY): Payer: Self-pay | Admitting: Pulmonary Disease

## 2018-07-24 DIAGNOSIS — R062 Wheezing: Secondary | ICD-10-CM | POA: Diagnosis not present

## 2018-07-24 DIAGNOSIS — J449 Chronic obstructive pulmonary disease, unspecified: Secondary | ICD-10-CM

## 2018-09-10 ENCOUNTER — Encounter (HOSPITAL_COMMUNITY): Payer: Self-pay | Admitting: *Deleted

## 2018-09-10 ENCOUNTER — Other Ambulatory Visit: Payer: Self-pay

## 2018-09-10 ENCOUNTER — Observation Stay (HOSPITAL_BASED_OUTPATIENT_CLINIC_OR_DEPARTMENT_OTHER): Payer: Medicare Other

## 2018-09-10 ENCOUNTER — Observation Stay (HOSPITAL_COMMUNITY)
Admission: EM | Admit: 2018-09-10 | Discharge: 2018-09-10 | Disposition: A | Discharge status: Home / Self Care | Payer: Medicare Other | Attending: Internal Medicine | Admitting: Internal Medicine

## 2018-09-10 ENCOUNTER — Emergency Department (HOSPITAL_COMMUNITY): Payer: Medicare Other

## 2018-09-10 DIAGNOSIS — I2510 Atherosclerotic heart disease of native coronary artery without angina pectoris: Secondary | ICD-10-CM | POA: Diagnosis not present

## 2018-09-10 DIAGNOSIS — Z79899 Other long term (current) drug therapy: Secondary | ICD-10-CM | POA: Insufficient documentation

## 2018-09-10 DIAGNOSIS — R079 Chest pain, unspecified: Secondary | ICD-10-CM

## 2018-09-10 DIAGNOSIS — Z794 Long term (current) use of insulin: Secondary | ICD-10-CM | POA: Insufficient documentation

## 2018-09-10 DIAGNOSIS — Z951 Presence of aortocoronary bypass graft: Secondary | ICD-10-CM | POA: Insufficient documentation

## 2018-09-10 DIAGNOSIS — E782 Mixed hyperlipidemia: Secondary | ICD-10-CM | POA: Diagnosis present

## 2018-09-10 DIAGNOSIS — Z716 Tobacco abuse counseling: Secondary | ICD-10-CM | POA: Insufficient documentation

## 2018-09-10 DIAGNOSIS — E1151 Type 2 diabetes mellitus with diabetic peripheral angiopathy without gangrene: Secondary | ICD-10-CM | POA: Diagnosis not present

## 2018-09-10 DIAGNOSIS — E1165 Type 2 diabetes mellitus with hyperglycemia: Secondary | ICD-10-CM | POA: Insufficient documentation

## 2018-09-10 DIAGNOSIS — Z79891 Long term (current) use of opiate analgesic: Secondary | ICD-10-CM | POA: Diagnosis not present

## 2018-09-10 DIAGNOSIS — I358 Other nonrheumatic aortic valve disorders: Secondary | ICD-10-CM | POA: Insufficient documentation

## 2018-09-10 DIAGNOSIS — I10 Essential (primary) hypertension: Secondary | ICD-10-CM | POA: Diagnosis present

## 2018-09-10 DIAGNOSIS — J449 Chronic obstructive pulmonary disease, unspecified: Secondary | ICD-10-CM | POA: Insufficient documentation

## 2018-09-10 DIAGNOSIS — Z7982 Long term (current) use of aspirin: Secondary | ICD-10-CM | POA: Diagnosis not present

## 2018-09-10 DIAGNOSIS — I25119 Atherosclerotic heart disease of native coronary artery with unspecified angina pectoris: Secondary | ICD-10-CM

## 2018-09-10 DIAGNOSIS — Z7902 Long term (current) use of antithrombotics/antiplatelets: Secondary | ICD-10-CM | POA: Diagnosis not present

## 2018-09-10 DIAGNOSIS — Z720 Tobacco use: Secondary | ICD-10-CM | POA: Diagnosis present

## 2018-09-10 DIAGNOSIS — F17210 Nicotine dependence, cigarettes, uncomplicated: Secondary | ICD-10-CM | POA: Insufficient documentation

## 2018-09-10 DIAGNOSIS — I739 Peripheral vascular disease, unspecified: Secondary | ICD-10-CM

## 2018-09-10 DIAGNOSIS — K219 Gastro-esophageal reflux disease without esophagitis: Secondary | ICD-10-CM | POA: Diagnosis not present

## 2018-09-10 DIAGNOSIS — R0789 Other chest pain: Secondary | ICD-10-CM | POA: Diagnosis not present

## 2018-09-10 LAB — CBC
HCT: 44.6 % (ref 39.0–52.0)
Hemoglobin: 15 g/dL (ref 13.0–17.0)
MCH: 30.9 pg (ref 26.0–34.0)
MCHC: 33.6 g/dL (ref 30.0–36.0)
MCV: 91.8 fL (ref 80.0–100.0)
NRBC: 0 % (ref 0.0–0.2)
PLATELETS: 168 10*3/uL (ref 150–400)
RBC: 4.86 MIL/uL (ref 4.22–5.81)
RDW: 11.9 % (ref 11.5–15.5)
WBC: 7 10*3/uL (ref 4.0–10.5)

## 2018-09-10 LAB — LIPID PANEL
Cholesterol: 196 mg/dL (ref 0–200)
HDL: 40 mg/dL — ABNORMAL LOW (ref >40)
LDL Cholesterol: 87 mg/dL (ref 0–99)
Total CHOL/HDL Ratio: 4.9 RATIO
Triglycerides: 343 mg/dL — ABNORMAL HIGH (ref <150)
VLDL: 69 mg/dL — ABNORMAL HIGH (ref 0–40)

## 2018-09-10 LAB — COMPREHENSIVE METABOLIC PANEL
ALT: 18 U/L (ref 0–44)
ANION GAP: 13 (ref 5–15)
AST: 16 U/L (ref 15–41)
Albumin: 3.9 g/dL (ref 3.5–5.0)
Alkaline Phosphatase: 93 U/L (ref 38–126)
BUN: 13 mg/dL (ref 6–20)
CO2: 22 mmol/L (ref 22–32)
CREATININE: 0.77 mg/dL (ref 0.61–1.24)
Calcium: 8.7 mg/dL — ABNORMAL LOW (ref 8.9–10.3)
Chloride: 98 mmol/L (ref 98–111)
GFR calc non Af Amer: >60 mL/min (ref >60)
Glucose, Bld: 267 mg/dL — ABNORMAL HIGH (ref 70–99)
POTASSIUM: 3.7 mmol/L (ref 3.5–5.1)
Sodium: 133 mmol/L — ABNORMAL LOW (ref 135–145)
TOTAL PROTEIN: 7.2 g/dL (ref 6.5–8.1)
Total Bilirubin: 0.4 mg/dL (ref 0.3–1.2)

## 2018-09-10 LAB — ECHOCARDIOGRAM COMPLETE
Height: 71 in
Weight: 2752 oz

## 2018-09-10 LAB — NM MYOCAR MULTI W/SPECT W/WALL MOTION / EF
LV dias vol: 89 mL (ref 62–150)
LV sys vol: 27 mL
Peak HR: 88 {beats}/min
RATE: 0.37
Rest HR: 68 {beats}/min
SDS: 0
SRS: 2
SSS: 2
TID: 1.11

## 2018-09-10 LAB — TROPONIN I: Troponin I: <0.03 ng/mL (ref <0.03)

## 2018-09-10 LAB — I-STAT TROPONIN, ED
TROPONIN I, POC: 0 ng/mL (ref 0.00–0.08)
Troponin i, poc: 0 ng/mL (ref 0.00–0.08)

## 2018-09-10 LAB — HEMOGLOBIN A1C
Hgb A1c MFr Bld: 9.5 % — ABNORMAL HIGH (ref 4.8–5.6)
MEAN PLASMA GLUCOSE: 225.95 mg/dL

## 2018-09-10 LAB — CBG MONITORING, ED
GLUCOSE-CAPILLARY: 308 mg/dL — AB (ref 70–99)
Glucose-Capillary: 358 mg/dL — ABNORMAL HIGH (ref 70–99)

## 2018-09-10 MED ORDER — ACETAMINOPHEN 325 MG PO TABS: 650.0000 mg | ORAL_TABLET | Freq: Four times a day (QID) | ORAL | Status: DC | PRN

## 2018-09-10 MED ORDER — ENOXAPARIN SODIUM 40 MG/0.4ML ~~LOC~~ SOLN: 40.0000 mg | SUBCUTANEOUS | Status: DC

## 2018-09-10 MED ORDER — ISOSORBIDE MONONITRATE ER 30 MG PO TB24
30.0000 mg | ORAL_TABLET | Freq: Every day | ORAL | Status: DC
  Administered 2018-09-10: 30 mg via ORAL
  Filled 2018-09-10 (×2): qty 1

## 2018-09-10 MED ORDER — TECHNETIUM TC 99M TETROFOSMIN IV KIT
10.0000 | PACK | Freq: Once | INTRAVENOUS | Status: AC | PRN
  Administered 2018-09-10: 8.6 via INTRAVENOUS

## 2018-09-10 MED ORDER — PANTOPRAZOLE SODIUM 40 MG PO TBEC: 40.0000 mg | DELAYED_RELEASE_TABLET | Freq: Every day | ORAL | Status: DC

## 2018-09-10 MED ORDER — IPRATROPIUM-ALBUTEROL 0.5-2.5 (3) MG/3ML IN SOLN: 3.0000 mL | RESPIRATORY_TRACT | Status: DC | PRN

## 2018-09-10 MED ORDER — DOCUSATE SODIUM 100 MG PO CAPS: 100.0000 mg | ORAL_CAPSULE | Freq: Two times a day (BID) | ORAL | Status: DC

## 2018-09-10 MED ORDER — SODIUM CHLORIDE 0.9% FLUSH
INTRAVENOUS | Status: AC
  Administered 2018-09-10: 10 mL via INTRAVENOUS
  Filled 2018-09-10: qty 10

## 2018-09-10 MED ORDER — CYCLOBENZAPRINE HCL 10 MG PO TABS
10.0000 mg | ORAL_TABLET | Freq: Three times a day (TID) | ORAL | Status: DC | PRN
Start: 2018-09-10 — End: 2018-09-10

## 2018-09-10 MED ORDER — REGADENOSON 0.4 MG/5ML IV SOLN
INTRAVENOUS | Status: AC
  Administered 2018-09-10: 0.4 mg via INTRAVENOUS
  Filled 2018-09-10: qty 5

## 2018-09-10 MED ORDER — DOCUSATE SODIUM 100 MG PO CAPS: 100.0000 mg | ORAL_CAPSULE | Freq: Two times a day (BID) | ORAL | 0 refills | Status: DC

## 2018-09-10 MED ORDER — ASPIRIN EC 81 MG PO TBEC: 81.0000 mg | DELAYED_RELEASE_TABLET | Freq: Every day | ORAL | Status: DC

## 2018-09-10 MED ORDER — ASPIRIN 81 MG PO CHEW
324.0000 mg | CHEWABLE_TABLET | Freq: Once | ORAL | Status: DC
  Filled 2018-09-10: qty 4

## 2018-09-10 MED ORDER — CLOPIDOGREL BISULFATE 75 MG PO TABS: 75.0000 mg | ORAL_TABLET | Freq: Every day | ORAL | Status: DC

## 2018-09-10 MED ORDER — ACETAMINOPHEN 650 MG RE SUPP: 650.0000 mg | Freq: Four times a day (QID) | RECTAL | Status: DC | PRN

## 2018-09-10 MED ORDER — ROSUVASTATIN CALCIUM 40 MG PO TABS: 40.0000 mg | ORAL_TABLET | Freq: Every day | ORAL | 1 refills | Status: AC

## 2018-09-10 MED ORDER — NITROGLYCERIN 0.4 MG SL SUBL
0.4000 mg | SUBLINGUAL_TABLET | SUBLINGUAL | Status: DC | PRN
  Administered 2018-09-10: 0.4 mg via SUBLINGUAL
  Filled 2018-09-10: qty 1

## 2018-09-10 MED ORDER — ONDANSETRON HCL 4 MG PO TABS: 4.0000 mg | ORAL_TABLET | Freq: Four times a day (QID) | ORAL | Status: DC | PRN

## 2018-09-10 MED ORDER — INSULIN ASPART 100 UNIT/ML ~~LOC~~ SOLN: 0.0000 [IU] | Freq: Every day | SUBCUTANEOUS | Status: DC

## 2018-09-10 MED ORDER — ROSUVASTATIN CALCIUM 20 MG PO TABS
20.0000 mg | ORAL_TABLET | Freq: Every day | ORAL | Status: DC
  Filled 2018-09-10 (×3): qty 1

## 2018-09-10 MED ORDER — INSULIN ASPART 100 UNIT/ML ~~LOC~~ SOLN: 0.0000 [IU] | Freq: Three times a day (TID) | SUBCUTANEOUS | Status: DC

## 2018-09-10 MED ORDER — METOPROLOL SUCCINATE ER 25 MG PO TB24
25.0000 mg | ORAL_TABLET | Freq: Every day | ORAL | Status: DC
  Administered 2018-09-10: 25 mg via ORAL
  Filled 2018-09-10: qty 1

## 2018-09-10 MED ORDER — ISOSORBIDE MONONITRATE ER 30 MG PO TB24: 30.0000 mg | ORAL_TABLET | Freq: Every day | ORAL | 1 refills | Status: DC

## 2018-09-10 MED ORDER — INSULIN DETEMIR 100 UNIT/ML ~~LOC~~ SOLN
22.0000 [IU] | Freq: Every day | SUBCUTANEOUS | Status: DC
  Administered 2018-09-10: 22 [IU] via SUBCUTANEOUS
  Filled 2018-09-10 (×2): qty 0.22

## 2018-09-10 MED ORDER — ASPIRIN 81 MG PO TBEC: 81.0000 mg | DELAYED_RELEASE_TABLET | Freq: Every day | ORAL | Status: AC

## 2018-09-10 MED ORDER — TECHNETIUM TC 99M TETROFOSMIN IV KIT
30.0000 | PACK | Freq: Once | INTRAVENOUS | Status: AC | PRN
  Administered 2018-09-10: 30 via INTRAVENOUS

## 2018-09-10 MED ORDER — ONDANSETRON HCL 4 MG/2ML IJ SOLN: 4.0000 mg | Freq: Four times a day (QID) | INTRAMUSCULAR | Status: DC | PRN

## 2018-09-10 NOTE — ED Provider Notes (Signed)
[facility] EMERGENCY DEPARTMENT Provider Note   CSN: [id number] Arrival date & time: 09/10/18  0450     History   Chief Complaint Chief Complaint  Patient presents with  . Chest Pain    HPI [name] is a 58 y.o. male.  Patient with history of CAD status post CABG in 2014, COPD, PAD, ongoing tobacco abuse, diabetes, hypertension hyperlipidemia presenting with left-sided chest pain onset around 3 AM while he was sitting in a chair.  Reports pain in the center of his chest that radiates to the left side of his chest and left arm and shoulder.  Pain is been ongoing for about the past 2 hours.  Associated with some dizziness and lightheadedness and shortness of breath.  Denies any nausea, vomiting or diaphoresis.  Patient was given 1 nitro by EMS with improvement in his pain to 5/10.  Denies having pain like this for a long time.  Denies any radiation of the pain to his neck or his back.  States compliance with his home medications.  Continues to smoke cigarettes.  No abdominal pain or low back pain.  EMS reports hyperglycemic to 400.  The history is provided by the patient, the EMS personnel and the spouse.  Chest Pain  Associated symptoms: shortness of breath   Associated symptoms: no abdominal pain, no dizziness, no headache, no nausea, no vomiting and no weakness     Past Medical History:  Diagnosis Date  . Arthritis    "some; in hips sometimes" (01/02/2013)  . Back pain    "w/prolonged walking or standing" (01/02/2013)  . CAD (coronary artery disease)    CABG X 4 08/2012  . COPD (chronic obstructive pulmonary disease) ([place])   . Esophageal erosions 05/24/01   egd by Dr. Gala Romney  . GERD (gastroesophageal reflux disease)   . H/O hiatal hernia   . History of gout   . Hyperlipidemia   . Hypertension    not on any medication for htn at present  . PAD (peripheral artery disease) ([place])    LEA DOPPLER, 11/11/2012 - moderate arterial insufficiency to lower extremities at rest,  BILATERAL SFA-demonstrates occlusive disease with reconstitution of flow  . Tobacco abuse   . Tubular adenoma of colon 08/28/11  . Type II diabetes mellitus ([place])   . Unintentional weight loss     Patient Active Problem List   Diagnosis Date Noted  . Lumbar stenosis with neurogenic claudication 10/30/2017  . Preoperative cardiovascular examination 10/11/2017  . Abdominal pain 05/16/2017  . Mixed hyperlipidemia 10/04/2016  . Back pain 05/04/2015  . Leg edema 08/22/2013  . Claudication in peripheral vascular disease ([place]) 02/03/2013  . 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 PAD (peripheral artery disease), [place] crossing of mid rt SFA, with diamond back athrectomy and debulking, chocolate balloon and stent with IDEV.  Unsucessful revascularization Lt SFA  08/23/2012  . Scrotal abscess 08/19/2012  . Coronary atherosclerosis of native coronary artery 08/19/2012  . Dysphagia 07/20/2012  . Unintentional weight loss 01/17/2012  . Diabetes mellitus ([place]) 01/17/2012  . Essential hypertension 01/17/2012  . Tobacco abuse 01/17/2012  . Hepatomegaly 01/17/2012  . Hx of adenomatous colonic polyps 01/17/2012    Past Surgical History:  Procedure Laterality Date  . ANTERIOR CERVICAL DECOMP/DISCECTOMY FUSION  12/19/2011   Procedure: ANTERIOR CERVICAL DECOMPRESSION/DISCECTOMY FUSION 2 LEVELS;  Surgeon: Floyce Stakes, MD;  Location: [facility]UR[facility];  Service: Neurosurgery;  Laterality: N/A;  Cervical four-five Cervical five-six  Anterior cervical decompression/diskectomy, fusion, plate  .  ATHERECTOMY N/A 01/02/2013   Procedure: ATHERECTOMY;  Surgeon: Lorretta Harp, MD;  Location: [facility] CATH LAB;  Service: Cardiovascular;  Laterality: N/A;  . BACK SURGERY     x3  . CARDIAC CATHETERIZATION  08/19/2012   CABG warranted; Severe ostial LCx stenosis with mid to distal vessel occlusion  . COLONOSCOPY W/ POLYPECTOMY  08/28/11   Rourk-tubular adenomas removed from ascending colon, suboptimal prep, diminutive rectal  polyps  . COLONOSCOPY WITH PROPOFOL N/A 06/13/2017   Procedure: COLONOSCOPY WITH PROPOFOL;  Surgeon: Daneil Dolin, MD;  Location: [facility] ENDO SU[facility];  Service: Endoscopy;  Laterality: N/A;  9:15am  . CORONARY ARTERY BYPASS GRAFT  08/22/2012   Procedure: CORONARY ARTERY BYPASS GRAFTING (CABG);  Surgeon: Grace Isaac, MD;  Location: [place];  Service: Open Heart Surgery;  Laterality: N/A;  CABG x four,  using left internal mammary artery and right leg greater saphenous vein harvested endoscopically  . ESOPHAGOGASTRODUODENOSCOPY  05/24/01   by Dr. Gala Romney  . ESOPHAGOGASTRODUODENOSCOPY  08/28/11   Rourk-erosive reflux esophagitis, gastric erosions  . ESOPHAGOGASTRODUODENOSCOPY (EGD) WITH PROPOFOL N/A 06/13/2017   Procedure: ESOPHAGOGASTRODUODENOSCOPY (EGD) WITH PROPOFOL;  Surgeon: Daneil Dolin, MD;  Location: [facility];  Service: Endoscopy;  Laterality: N/A;  . INTRAOPERATIVE TRANSESOPHAGEAL ECHOCARDIOGRAM  08/22/2012   Procedure: INTRAOPERATIVE TRANSESOPHAGEAL ECHOCARDIOGRAM;  Surgeon: Grace Isaac, MD;  Location: [place];  Service: Open Heart Surgery;  Laterality: N/A;  . LEFT HEART CATHETERIZATION WITH CORONARY ANGIOGRAM N/A 08/19/2012   Procedure: LEFT HEART CATHETERIZATION WITH CORONARY ANGIOGRAM;  Surgeon: [name], MD;  Location: [facility] CATH LAB;  Service: Cardiovascular;  Laterality: N/A;  . LOWER EXTREMITY ANGIOGRAM N/A 11/17/2012   Procedure: LOWER EXTREMITY ANGIOGRAM;  Surgeon: Lorretta Harp, MD;  Location: [facility] CATH LAB;  Service: Cardiovascular;  Laterality: N/A;  . LOWER EXTREMITY ANGIOGRAM N/A 02/02/2013   Procedure: LOWER EXTREMITY ANGIOGRAM;  Surgeon: Lorretta Harp, MD;  Location: [facility] CATH LAB;  Service: Cardiovascular;  Laterality: N/A;  . LUMBAR [place]    . LUMBAR FUSION  ~ 2011  . PELVIC ABCESS DRAINAGE     x2  . POLYPECTOMY  06/13/2017   Procedure: POLYPECTOMY;  Surgeon: Daneil Dolin, MD;  Location: [facility];  Service: Endoscopy;;  colon  . SHOULDER  SURGERY Right   . VASCULAR SURGERY Right 01/02/2013   diamondback orbital rotational  atherectomy, chocolate balloon and IDEV  Stent.        Home Medications    Prior to Admission medications   Medication Sig Start Date End Date Taking? Authorizing Provider  clopidogrel (PLAVIX) 75 MG tablet Take 1 tablet (75 mg total) by mouth daily with breakfast. 05/03/15   Hilty, Nadean Corwin, MD  cyclobenzaprine (FLEXERIL) 10 MG tablet Take 1 tablet (10 mg total) by mouth 3 (three) times daily as needed for muscle spasms. 10/31/17   Newman Pies, MD  docusate sodium (COLACE) 100 MG capsule Take 1 capsule (100 mg total) by mouth 2 (two) times daily. 10/31/17   Newman Pies, MD  insulin aspart (NOVOLOG) 100 UNIT/ML injection Inject 10-25 Units into the skin See admin instructions. 10-15 units in the morning, 25 units at bedtime    [provider]  metoprolol succinate (TOPROL-XL) 25 MG 24 hr tablet Take 1 tablet (25 mg total) by mouth daily. 10/11/17   Hilty, Nadean Corwin, MD  oxyCODONE (OXY IR/ROXICODONE) 5 MG immediate release tablet Take 1 tablet (5 mg total) by mouth every 4 (four) hours as needed for moderate  pain ((score 4 to 6)). 10/31/17   Newman Pies, MD  pantoprazole (PROTONIX) 40 MG tablet Take 1 tablet (40 mg total) by mouth daily. 05/16/17   Carlis Stable, NP  PROAIR HFA 108 (90 BASE) MCG/ACT inhaler Inhale 2 puffs into the lungs every 6 (six) hours as needed for wheezing or shortness of breath.  08/14/13   [provider]  rosuvastatin (CRESTOR) 20 MG tablet Take 20 mg by mouth daily.    [provider]  zolpidem (AMBIEN CR) 12.5 MG CR tablet Take 12.5 mg by mouth at bedtime as needed for sleep.  09/19/15   [provider]    Family History Family History  Problem Relation Age of Onset  . Anesthesia problems Neg Hx   . Colon cancer Neg Hx   . Gastric cancer Neg Hx   . Esophageal cancer Neg Hx     Social History Social History   Tobacco Use  .  Smoking status: Current Every Day Smoker    Packs/day: 1.00    Years: 39.00    Pack years: 39.00    Types: Cigarettes  . Smokeless tobacco: Never Used  Substance Use Topics  . Alcohol use: Yes    Alcohol/week: 6.0 standard drinks    Types: 6 Cans of beer per week  . Drug use: No     Allergies   Patient has no known allergies.   Review of Systems Review of Systems  Constitutional: Negative for activity change and appetite change.  HENT: Negative for congestion.   Eyes: Negative for visual disturbance.  Respiratory: Positive for chest tightness and shortness of breath.   Cardiovascular: Positive for chest pain. Negative for leg swelling.  Gastrointestinal: Negative for abdominal pain, nausea and vomiting.  Genitourinary: Negative for dysuria and hematuria.  Musculoskeletal: Negative for arthralgias and myalgias.  Neurological: Negative for dizziness, weakness and headaches.   all other systems are negative except as noted in the HPI and PMH.     Physical Exam Updated Vital Signs Ht 5\' 11"  (1.803 m)   Wt 78 kg   BMI 23.99 kg/m   Physical Exam Vitals signs and nursing note reviewed.  Constitutional:      General: He is not in acute distress.    Appearance: He is well-developed.  HENT:     Head: Normocephalic and atraumatic.     Mouth/Throat:     Pharynx: No oropharyngeal exudate.  Eyes:     Conjunctiva/sclera: Conjunctivae normal.     Pupils: Pupils are equal, round, and reactive to light.  Neck:     Musculoskeletal: Normal range of motion and neck supple.     Comments: No meningismus. Cardiovascular:     Rate and Rhythm: Normal rate and regular rhythm.     Heart sounds: Normal heart sounds. No murmur.     Comments: Equal radial pulses. PT pulses present with Doppler bilaterally Pulmonary:     Effort: Pulmonary effort is normal. No respiratory distress.     Breath sounds: Normal breath sounds.  Abdominal:     Palpations: Abdomen is soft.     Tenderness:  There is no abdominal tenderness. There is no guarding or rebound.  Musculoskeletal: Normal range of motion.        General: No tenderness.     Comments: Equal radial pulses and grip strengths  Skin:    General: Skin is warm.     Capillary Refill: Capillary refill takes less than 2 seconds.  Neurological:  General: No focal deficit present.     Mental Status: He is alert and oriented to person, place, and time. Mental status is at baseline.     Cranial Nerves: No cranial nerve deficit.     Motor: No abnormal muscle tone.     Coordination: Coordination normal.     Comments: No ataxia on finger to nose bilaterally. No pronator drift. 5/5 strength throughout. CN 2-12 intact.Equal grip strength. Sensation intact.   Psychiatric:        Behavior: Behavior normal.      ED Treatments / Results  Labs (all labs ordered are listed, but only abnormal results are displayed) Labs Reviewed  COMPREHENSIVE METABOLIC PANEL - Abnormal; Notable for the following components:      Result Value   Sodium 133 (*)    Glucose, Bld 267 (*)    Calcium 8.7 (*)    All other components within normal limits  CBG MONITORING, ED - Abnormal; Notable for the following components:   Glucose-Capillary 308 (*)    All other components within normal limits  CBC  TROPONIN I  I-STAT TROPONIN, ED  I-STAT TROPONIN, ED    EKG EKG Interpretation  Date/Time:  Wednesday September 10 2018 04:59:25 EST Ventricular Rate:  77 PR Interval:    QRS Duration: 82 QT Interval:  398 QTC Calculation: 451 R Axis:   49 Text Interpretation:  Sinus rhythm Anteroseptal infarct, age indeterminate No significant change was found Confirmed by Ezequiel Essex [id number]) on 09/10/2018 5:04:16 AM   Radiology Dg Chest Portable 1 View  Result Date: 09/10/2018 CLINICAL DATA:  Chest pain EXAM: PORTABLE CHEST 1 VIEW COMPARISON:  07/24/2018 FINDINGS: Normal heart size and mediastinal contours. CABG. No acute infiltrate or edema. No effusion  or pneumothorax. No acute osseous findings. IMPRESSION: No evidence of active disease. Electronically Signed   By: Monte Fantasia M.D.   On: 09/10/2018 05:59    Procedures Procedures (including critical care time)  Medications Ordered in ED Medications  aspirin chewable tablet 324 mg (has no administration in time range)  nitroGLYCERIN (NITROSTAT) SL tablet 0.4 mg (has no administration in time range)     Initial Impression / Assessment and Plan / ED Course  I have reviewed the triage vital signs and the nursing notes.  Pertinent labs & imaging results that were available during my care of the patient were reviewed by me and considered in my medical decision making (see chart for details).     Left-sided chest pain and shortness of breath concerning for angina.  EKG shows septal Q waves without ST elevation.  ASA, NTG. Labs, CXR.  Troponin negative. Chest pain resolved after 2 NTG.hyperglycemia without DKA.  Concern for angina. HEART score 5. Low suspicion for PE or aortic dissection. Remains chest pain free.  Admission d/w Dr. Olevia Bowens. Final Clinical Impressions(s) / ED Diagnoses   Final diagnoses:  Chest pain, unspecified type    ED Discharge Orders    None       Viktor Philipp, Annie Main, MD 09/10/18 [phone]

## 2018-09-10 NOTE — Discharge Summary (Signed)
Physician Discharge Summary  [name] UYQ:[id number] DOB: 08-04-1961 DOA: 09/10/2018  PCP: Sinda Du, MD  Admit date: 09/10/2018 Discharge date: 09/10/2018  Admitted From: Home Disposition:  Home   Recommendations for Outpatient Follow-up:  1. Follow up with PCP in 1-2 weeks 2. Please obtain BMP/CBC in one week 3. Please follow up with Dr. Debara Pickett on 10/06/18    Discharge Condition: Stable CODE STATUS: FULL Diet recommendation: Heart Healthy/Carb modified  Brief/Interim Summary: 58 y.o. male with medical history of coronary disease, peripheral arterial disease, COPD, tobacco use, hypertension, diabetes mellitus, hyperlipidemia presenting with chest pain that began at 3 AM on the morning of 09/10/2018.  The patient states that he has been having intermittent chest pain for the past 2 to 3 weeks associated with rest as well as exertion.  He has noted some increased dyspnea on exertion for the past 3 weeks.  He continues to smoke approximately 1 pack/day.  He denies any fevers, chills, hemoptysis, nausea, vomiting, diarrhea, abdominal pain, dysuria, hematuria.  The patient states that his episode of chest pain is sharp in nature moderate in intensity that started around 3 AM while he was sitting in a chair watching television.  He had radiation to this left shoulder.  He had some nausea without any worsening shortness of breath or diaphoresis.  EMS was activated.  The patient was given nitroglycerin with improvement of his chest pain.  He endorses compliance with all his medications.  He stated the episode lasted approximately 30 minutes. In the emergency department, the patient was afebrile hemodynamically stable saturating 98% room air.  BMP showed sodium 133.  LFTs and CBC were essentially unremarkable.  Initial troponin was negative.  EKG shows sinus rhythm without any ST-T wave changes.  Chest x-ray was negative.  Cardiology was consulted to assist with management.  Discharge Diagnoses:   Chest pain -Patient has significant cardiac risk factors with a history of CABG 2014 -Cycle troponins--neg x 2 -Echocardiogram--EF 60-65%, normal RV, trivial MR -Consult cardiology-->NM stress test -09/10/18--NM stress-->low risk, EF 70% -Hemoglobin A1c--pending at time of d/c -Lipid panel--LDL 87 -Imdur 30 mg added by cardiology  Diabetes mellitus type 2 -Hemoglobin A1c -NovoLog sliding scale  Hyperlipidemia -Continue statin -increase crestor to 40 mg daily  Coronary artery disease -Continue aspirin and Plavix -Continue metoprolol succinate  Peripheral arterial disease -Continue aspirin and Plavix  Tobacco abuse I have discussed tobacco cessation with the patient.  I have counseled the patient regarding the negative impacts of continued tobacco use including but not limited to lung cancer, COPD, and cardiovascular disease.  I have discussed alternatives to tobacco and modalities that may help facilitate tobacco cessation including but not limited to biofeedback, hypnosis, and medications.  Total time spent with tobacco counseling was 4 minutes.   Discharge Instructions   Allergies as of 09/10/2018   No Known Allergies     Medication List    TAKE these medications   aspirin 81 MG EC tablet Take 1 tablet (81 mg total) by mouth daily. Start taking on:  September 11, 2018 What changed:    medication strength  how much to take   clopidogrel 75 MG tablet Commonly known as:  PLAVIX Take 1 tablet (75 mg total) by mouth daily with breakfast.   cyclobenzaprine 10 MG tablet Commonly known as:  FLEXERIL Take 1 tablet (10 mg total) by mouth 3 (three) times daily as needed for muscle spasms.   docusate sodium 100 MG capsule Commonly known as:  COLACE Take  1 capsule (100 mg total) by mouth 2 (two) times daily.   gabapentin 300 MG capsule Commonly known as:  NEURONTIN Take 1 capsule by mouth daily.   HUMALOG MIX 75/25 KWIKPEN (75-25) 100 UNIT/ML Kwikpen Generic  drug:  Insulin Lispro Prot & Lispro Inject 30-60 Units into the skin 2 (two) times daily. Patient takes 30 units in the morning and 60 units at night   isosorbide mononitrate 30 MG 24 hr tablet Commonly known as:  IMDUR Take 1 tablet (30 mg total) by mouth daily. Start taking on:  September 11, 2018   metoprolol succinate 25 MG 24 hr tablet Commonly known as:  TOPROL-XL Take 1 tablet (25 mg total) by mouth daily.   oxyCODONE-acetaminophen 5-325 MG tablet Commonly known as:  PERCOCET/ROXICET Take 1 tablet by mouth every 6 (six) hours as needed.   PROAIR HFA 108 (90 Base) MCG/ACT inhaler Generic drug:  albuterol Inhale 2 puffs into the lungs every 6 (six) hours as needed for wheezing or shortness of breath.   rosuvastatin 40 MG tablet Commonly known as:  CRESTOR Take 1 tablet (40 mg total) by mouth daily. What changed:    medication strength  how much to take   zolpidem 12.5 MG CR tablet Commonly known as:  AMBIEN CR Take 12.5 mg by mouth at bedtime as needed for sleep.      Follow-up Information    [name], MD Follow up on 10/06/2018.   Specialty:  Cardiology Why:  Keep scheduled Cardiology follow-up on 10/06/2018 at 2:00PM Contact information: [place] [phone]          No Known Allergies  Consultations:  cardiology   Procedures/Studies: Nm Myocar Multi W/spect W/wall Motion / Ef  Result Date: 09/10/2018  No diagnostic ST segment changes to indicate ischemia.  Very small, mild intensity, apical inferior and anterior defects that are partially reversible and most consistent with variable soft tissue attenuation rather than ischemia. No large ischemic territories are noted.  This is a low risk study.  Nuclear stress EF: 70%.    Dg Chest Portable 1 View  Result Date: 09/10/2018 CLINICAL DATA:  Chest pain EXAM: PORTABLE CHEST 1 VIEW COMPARISON:  07/24/2018 FINDINGS: Normal heart size and mediastinal contours.  CABG. No acute infiltrate or edema. No effusion or pneumothorax. No acute osseous findings. IMPRESSION: No evidence of active disease. Electronically Signed   By: Monte Fantasia M.D.   On: 09/10/2018 05:59         Discharge Exam: Vitals:   09/10/18 1600 09/10/18 1615  BP: 124/84 (!) 136/59  Pulse: 72 66  Resp: (!) 25 (!) 22  SpO2: 93% 94%   Vitals:   09/10/18 1445 09/10/18 1545 09/10/18 1600 09/10/18 1615  BP: (!) 147/65 123/61 124/84 (!) 136/59  Pulse: 67 71 72 66  Resp: 19 19 (!) 25 (!) 22  SpO2: 93% 92% 93% 94%  Weight:      Height:        General: Pt is alert, awake, not in acute distress Cardiovascular: RRR, S1/S2 +, no rubs, no gallops Respiratory: CTA bilaterally, no wheezing, no rhonchi Abdominal: Soft, NT, ND, bowel sounds + Extremities: no edema, no cyanosis   The results of significant diagnostics from this hospitalization (including imaging, microbiology, ancillary and laboratory) are listed below for reference.    Significant Diagnostic Studies: Nm Myocar Multi W/spect W/wall Motion / Ef  Result Date: 09/10/2018  No diagnostic ST segment changes to indicate  ischemia.  Very small, mild intensity, apical inferior and anterior defects that are partially reversible and most consistent with variable soft tissue attenuation rather than ischemia. No large ischemic territories are noted.  This is a low risk study.  Nuclear stress EF: 70%.    Dg Chest Portable 1 View  Result Date: 09/10/2018 CLINICAL DATA:  Chest pain EXAM: PORTABLE CHEST 1 VIEW COMPARISON:  07/24/2018 FINDINGS: Normal heart size and mediastinal contours. CABG. No acute infiltrate or edema. No effusion or pneumothorax. No acute osseous findings. IMPRESSION: No evidence of active disease. Electronically Signed   By: Monte Fantasia M.D.   On: 09/10/2018 05:59     Microbiology: No results found for this or any previous visit (from the past 240 hour(s)).   Labs: Basic Metabolic Panel: Recent  Labs  Lab 09/10/18 0606  NA 133*  K 3.7  CL 98  CO2 22  GLUCOSE 267*  BUN 13  CREATININE 0.77  CALCIUM 8.7*   Liver Function Tests: Recent Labs  Lab 09/10/18 0606  AST 16  ALT 18  ALKPHOS 93  BILITOT 0.4  PROT 7.2  ALBUMIN 3.9   No results for input(s): LIPASE, AMYLASE in the last 168 hours. No results for input(s): AMMONIA in the last 168 hours. CBC: Recent Labs  Lab 09/10/18 0606  WBC 7.0  HGB 15.0  HCT 44.6  MCV 91.8  PLT 168   Cardiac Enzymes: Recent Labs  Lab 09/10/18 0606 09/10/18 1356  TROPONINI <0.03 <0.03   BNP: Invalid input(s): POCBNP CBG: Recent Labs  Lab 09/10/18 0524 09/10/18 1517  GLUCAP 308* 358*    Time coordinating discharge:  36 minutes  Signed:  Orson Eva, DO [facility] Pager: [phone] 09/10/2018, 6:28 PM

## 2018-09-10 NOTE — ED Triage Notes (Signed)
Pt brought in by [facility] for c/o chest pain that started x 2 hours ago while sitting up in the chair pt states the pain radiated up to his arm and shoulder; pt states he felt dizzy when the pain occurred; pt took 324mg  baby asa pta of ems; pt was given nitro SL by ems; pt cbg was reading over 400 with ems

## 2018-09-10 NOTE — Consult Note (Addendum)
Cardiology Consult    Patient ID: [name]; [id number]; [birth year]   Admit date: 09/10/2018 Date of Consult: 09/10/2018  Primary Care Provider: Sinda Du, MD Primary Cardiologist: [name], MD   Patient Profile    [name] is a 58 y.o. male with past medical history of CAD (s/p CABG in 2014 with LIMA-LAD, SVG-D1, SVG-RI, and SVG-RCA), PVD (s/p prior bilateral SFA interventions), HTN, HLD, Type 2 DM, and tobacco use who is being seen today for the evaluation of chest pain at the request of Dr. Carles Collet.   History of Present Illness    [name] was last examined by Dr. Debara Pickett in 10/2017 for preoperative cardiac clearance in regards to upcoming back surgery. He denied any recent chest pain or dyspnea on exertion and was cleared to proceed with upcoming surgery.  In talking with the patient today, he reports having episodes of a shooting chest discomfort around 0300 while watching television. The pain would last for a few seconds at a time and then resolve. He denies any associated dyspnea, nausea, or vomiting during the episode. Pain was not worse with exertion or positional changes. He is unsure if this is similar to his prior anginal episodes as he did not have chest pain or dyspnea at the time of CABG in 2014. He does report progressive dyspnea on exertion over the past 2 months when carrying out routine activities. No associated orthopnea, PND, or lower extremity edema. Does continue to smoke approximately 1 pack/day as he has done for 30+ years.  Initial labs showed WBC 7.0, Hgb 15.0, platelets 168, Na+ 133, K+ 3.7, glucose 267, and creatinine 0.77.  Initial and delta troponin values have been negative with cyclic values pending.  CXR showed no evidence of active disease. EKG shows NSR, HR 77, with anterior Q-waves and TWI along AVL, V1-V2.    Past Medical History:  Diagnosis Date  . Arthritis    "some; in hips sometimes" (01/02/2013)  . Back pain    "w/prolonged  walking or standing" (01/02/2013)  . CAD (coronary artery disease)    CABG X 4 08/2012  . COPD (chronic obstructive pulmonary disease) ([place])   . Esophageal erosions 05/24/01   egd by Dr. Gala Romney  . GERD (gastroesophageal reflux disease)   . H/O hiatal hernia   . History of gout   . Hyperlipidemia   . Hypertension    not on any medication for htn at present  . PAD (peripheral artery disease) ([place])    LEA DOPPLER, 11/11/2012 - moderate arterial insufficiency to lower extremities at rest, BILATERAL SFA-demonstrates occlusive disease with reconstitution of flow  . Tobacco abuse   . Tubular adenoma of colon 08/28/11  . Type II diabetes mellitus ([place])   . Unintentional weight loss     Past Surgical History:  Procedure Laterality Date  . ANTERIOR CERVICAL DECOMP/DISCECTOMY FUSION  12/19/2011   Procedure: ANTERIOR CERVICAL DECOMPRESSION/DISCECTOMY FUSION 2 LEVELS;  Surgeon: Floyce Stakes, MD;  Location: [facility];  Service: Neurosurgery;  Laterality: N/A;  Cervical four-five Cervical five-six  Anterior cervical decompression/diskectomy, fusion, plate  . ATHERECTOMY N/A 01/02/2013   Procedure: ATHERECTOMY;  Surgeon: Lorretta Harp, MD;  Location: [facility] CATH LAB;  Service: Cardiovascular;  Laterality: N/A;  . BACK SURGERY     x3  . CARDIAC CATHETERIZATION  08/19/2012   CABG warranted; Severe ostial LCx stenosis with mid to distal vessel occlusion  . COLONOSCOPY W/ POLYPECTOMY  08/28/11   Rourk-tubular adenomas  removed from ascending colon, suboptimal prep, diminutive rectal polyps  . COLONOSCOPY WITH PROPOFOL N/A 06/13/2017   Procedure: COLONOSCOPY WITH PROPOFOL;  Surgeon: Daneil Dolin, MD;  Location: [facility];  Service: Endoscopy;  Laterality: N/A;  9:15am  . CORONARY ARTERY BYPASS GRAFT  08/22/2012   Procedure: CORONARY ARTERY BYPASS GRAFTING (CABG);  Surgeon: Grace Isaac, MD;  Location: [place];  Service: Open Heart Surgery;  Laterality: N/A;  CABG x four,  using left internal mammary  artery and right leg greater saphenous vein harvested endoscopically  . ESOPHAGOGASTRODUODENOSCOPY  05/24/01   by Dr. Gala Romney  . ESOPHAGOGASTRODUODENOSCOPY  08/28/11   Rourk-erosive reflux esophagitis, gastric erosions  . ESOPHAGOGASTRODUODENOSCOPY (EGD) WITH PROPOFOL N/A 06/13/2017   Procedure: ESOPHAGOGASTRODUODENOSCOPY (EGD) WITH PROPOFOL;  Surgeon: Daneil Dolin, MD;  Location: [facility];  Service: Endoscopy;  Laterality: N/A;  . INTRAOPERATIVE TRANSESOPHAGEAL ECHOCARDIOGRAM  08/22/2012   Procedure: INTRAOPERATIVE TRANSESOPHAGEAL ECHOCARDIOGRAM;  Surgeon: Grace Isaac, MD;  Location: [place];  Service: Open Heart Surgery;  Laterality: N/A;  . LEFT HEART CATHETERIZATION WITH CORONARY ANGIOGRAM N/A 08/19/2012   Procedure: LEFT HEART CATHETERIZATION WITH CORONARY ANGIOGRAM;  Surgeon: [name], MD;  Location: [facility] CATH LAB;  Service: Cardiovascular;  Laterality: N/A;  . LOWER EXTREMITY ANGIOGRAM N/A 11/17/2012   Procedure: LOWER EXTREMITY ANGIOGRAM;  Surgeon: Lorretta Harp, MD;  Location: [facility] CATH LAB;  Service: Cardiovascular;  Laterality: N/A;  . LOWER EXTREMITY ANGIOGRAM N/A 02/02/2013   Procedure: LOWER EXTREMITY ANGIOGRAM;  Surgeon: Lorretta Harp, MD;  Location: [facility] CATH LAB;  Service: Cardiovascular;  Laterality: N/A;  . LUMBAR [place]    . LUMBAR FUSION  ~ 2011  . PELVIC ABCESS DRAINAGE     x2  . POLYPECTOMY  06/13/2017   Procedure: POLYPECTOMY;  Surgeon: Daneil Dolin, MD;  Location: [facility];  Service: Endoscopy;;  colon  . SHOULDER SURGERY Right   . VASCULAR SURGERY Right 01/02/2013   diamondback orbital rotational  atherectomy, chocolate balloon and IDEV  Stent.     Home Medications:  Prior to Admission medications   Medication Sig Start Date End Date Taking? Authorizing Provider  aspirin EC 325 MG tablet Take 1 tablet by mouth daily.   Yes [provider]  clopidogrel (PLAVIX) 75 MG tablet Take 1 tablet (75 mg total) by mouth daily with  breakfast. 05/03/15  Yes Hilty, Nadean Corwin, MD  cyclobenzaprine (FLEXERIL) 10 MG tablet Take 1 tablet (10 mg total) by mouth 3 (three) times daily as needed for muscle spasms. 10/31/17  Yes Newman Pies, MD  gabapentin (NEURONTIN) 300 MG capsule Take 1 capsule by mouth daily. 08/19/18  Yes [provider]  HUMALOG MIX 75/25 KWIKPEN (75-25) 100 UNIT/ML Kwikpen Inject 30-60 Units into the skin 2 (two) times daily. Patient takes 30 units in the morning and 60 units at night 09/07/18  Yes [provider]  metoprolol succinate (TOPROL-XL) 25 MG 24 hr tablet Take 1 tablet (25 mg total) by mouth daily. 10/11/17  Yes Hilty, Nadean Corwin, MD  oxyCODONE-acetaminophen (PERCOCET/ROXICET) 5-325 MG tablet Take 1 tablet by mouth every 6 (six) hours as needed. 09/03/18  Yes [provider]  PROAIR HFA 108 (90 BASE) MCG/ACT inhaler Inhale 2 puffs into the lungs every 6 (six) hours as needed for wheezing or shortness of breath.  08/14/13  Yes [provider]  rosuvastatin (CRESTOR) 20 MG tablet Take 20 mg by mouth daily.   Yes [provider]  zolpidem Lorrin Mais  CR) 12.5 MG CR tablet Take 12.5 mg by mouth at bedtime as needed for sleep.  09/19/15  Yes [provider]    Inpatient Medications: Scheduled Meds:  Continuous Infusions:  PRN Meds: nitroGLYCERIN  Allergies:   No Known Allergies  Social History:   Social History   Socioeconomic History  . Marital status: Single    Spouse name: Not on file  . Number of children: 1  . Years of education: Not on file  . Highest education level: Not on file  Occupational History  . Occupation: disabled    Employer: DISABLED  Social Needs  . Financial resource strain: Not on file  . Food insecurity:    Worry: Not on file    Inability: Not on file  . Transportation needs:    Medical: Not on file    Non-medical: Not on file  Tobacco Use  . Smoking status: Current Every Day Smoker    Packs/day: 1.00    Years: 39.00     Pack years: 39.00    Types: Cigarettes  . Smokeless tobacco: Never Used  Substance and Sexual Activity  . Alcohol use: Yes    Alcohol/week: 6.0 standard drinks    Types: 6 Cans of beer per week  . Drug use: No  . Sexual activity: Not Currently  Lifestyle  . Physical activity:    Days per week: Not on file    Minutes per session: Not on file  . Stress: Not on file  Relationships  . Social connections:    Talks on phone: Not on file    Gets together: Not on file    Attends religious service: Not on file    Active member of club or organization: Not on file    Attends meetings of clubs or organizations: Not on file    Relationship status: Not on file  . Intimate partner violence:    Fear of current or ex partner: Not on file    Emotionally abused: Not on file    Physically abused: Not on file    Forced sexual activity: Not on file  Other Topics Concern  . Not on file  Social History Narrative   Lives w/ girlfriend     Family History:    Family History  Problem Relation Age of Onset  . Anesthesia problems Neg Hx   . Colon cancer Neg Hx   . Gastric cancer Neg Hx   . Esophageal cancer Neg Hx       Review of Systems    General:  No chills, fever, night sweats or weight changes.  Cardiovascular:  No edema, orthopnea, palpitations, paroxysmal nocturnal dyspnea. Positive for chest pain and dyspnea on exertion.  Dermatological: No rash, lesions/masses Respiratory: No cough, dyspnea Urologic: No hematuria, dysuria Abdominal:   No nausea, vomiting, diarrhea, bright red blood per rectum, melena, or hematemesis Neurologic:  No visual changes, wkns, changes in mental status. All other systems reviewed and are otherwise negative except as noted above.  Physical Exam/Data    Vitals:   09/10/18 0700 09/10/18 0715 09/10/18 0730 09/10/18 0745  BP: 122/60  134/63 133/62  Pulse: 64 68 73 68  Resp: 20 17 19 19   SpO2: 95% 96% 98% 97%  Weight:      Height:       No intake  or output data in the 24 hours ending 09/10/18 0936 Filed Weights   09/10/18 0456  Weight: 78 kg   Body mass index is 23.99 kg/m.  General: Pleasant, Caucasian male appearing in NAD Psych: Normal affect. Neuro: Alert and oriented X 3. Moves all extremities spontaneously. HEENT: Normal  Neck: Supple without bruits or JVD. Lungs:  Resp regular and unlabored, CTA without wheezing or rales. Heart: RRR no s3, s4, or murmurs. Abdomen: Soft, non-tender, non-distended, BS + x 4.  Extremities: No clubbing, cyanosis or edema. DP/PT/Radials 2+ and equal bilaterally.  Telemetry:  Telemetry was personally reviewed and demonstrates:  NSR, HR in 70's to 80's.    Labs/Studies     Relevant CV Studies:  Cardiac Catheterization: 08/2012 Left Ventriculography:             EF:  60-65%             Wall Motion: normal  Coronary Angiographic Data:  Left Main:  4.0 vessel, tapers to 2.0 distally with heavy eccentric calcification (50% stenosis)  Left Anterior Descending (LAD):  There is a tubular 50-60% proximal LAD stenosis, however, the distal vessel is preserved and reaches around the apex.  1st diagonal (D1):  Moderate sized vessel with 80% ostial stenosis.  Circumflex (LCx):  There is 80% calcified ostial stenosis of the LCX - the vessel appears to abruptly occluded in the mid-distal segment, with numerous OM vessels and collaterals thereafter.  Ramus Intermedius:  True ramus vessel with moderate proximal disease  Right Coronary Artery: Dominant vessel. Heavily calcified throughout. There is a proximal 80% stenosis and a distal 90% stenosis prior to the genu.  posterior descending artery: extensive distribution, 2 large branches, mild to moderate ostial disease of the first branch.  posterior lateral branch:  No significant stenoses.  LIMA - Non-selective visualization of the LIMA demonstrates it may be suitable for bypass.  Impression: 1.  Calcified 50% distal LM stenosis. 2.   Severe ostial LCx stenosis with mid to distal vessel occlusion. 3.  Severe Proximal and distal RCA stenoses which are heavily calcified. 4.  Tubular moderate proximal LAD disease. 5.  Normal LV function.  Plan: 1.  Multivessel and LM disease, heavily calcified in a diabetic.  CABG evaluation is warranted. 2.  Asked TCTS to evaluate - probably okay for discharge, but would be good to revascularize sooner than later.   Lower Extremity Arterial Duplex: 11/2016 FINDINGS: Right ABI:  0.82  Left ABI:  0.87  Right Lower Extremity: Preserved biphasic to triphasic flow throughout the right femoral, superficial femoral, popliteal, post tibia dorsalis pedis vasculature. No significant waveform abnormality.  Left Lower Extremity: Similar symmetric biphasic to triphasic flow throughout the left femoral, superficial femoral, popliteal, post tibial and dorsalis pedis vasculature. No significant waveform abnormality.  IMPRESSION: Mildly decreased ABIs bilaterally at rest but no significant vascular occlusive process.  Laboratory Data:  Chemistry Recent Labs  Lab 09/10/18 0606  NA 133*  K 3.7  CL 98  CO2 22  GLUCOSE 267*  BUN 13  CREATININE 0.77  CALCIUM 8.7*  GFRNONAA >60  GFRAA >60  ANIONGAP 13    Recent Labs  Lab 09/10/18 0606  PROT 7.2  ALBUMIN 3.9  AST 16  ALT 18  ALKPHOS 93  BILITOT 0.4   Hematology Recent Labs  Lab 09/10/18 0606  WBC 7.0  RBC 4.86  HGB 15.0  HCT 44.6  MCV 91.8  MCH 30.9  MCHC 33.6  RDW 11.9  PLT 168   Cardiac Enzymes Recent Labs  Lab 09/10/18 0606  TROPONINI <0.03    Recent Labs  Lab 09/10/18 0545 09/10/18 0806  TROPIPOC 0.00 0.00    BNPNo  results for input(s): BNP, PROBNP in the last 168 hours.  DDimer No results for input(s): DDIMER in the last 168 hours.  Radiology/Studies:  Dg Chest Portable 1 View  Result Date: 09/10/2018 CLINICAL DATA:  Chest pain EXAM: PORTABLE CHEST 1 VIEW COMPARISON:  07/24/2018 FINDINGS:  Normal heart size and mediastinal contours. CABG. No acute infiltrate or edema. No effusion or pneumothorax. No acute osseous findings. IMPRESSION: No evidence of active disease. Electronically Signed   By: Monte Fantasia M.D.   On: 09/10/2018 05:59     Assessment & Plan    1. Atypical Chest Pain in the Setting of Known CAD - he is s/p CABG in 2014 with LIMA-LAD, SVG-D1, SVG-RI, and SVG-RCA and has not undergone repeat ischemic evaluation since.  Presents with episodes of shooting chest discomfort which only last for seconds at a time and are not associated with exertion. Overall, this seems atypical for angina but he does report progressive dyspnea on exertion over the past several months. Did not have anginal symptoms at the time of CABG in 2014 so no prior symptoms to compare to.  -  Initial and delta troponin values have been negative with cyclic values pending. EKG shows NSR, HR 77, with anterior Q-waves and TWI along AVL, V1-V2. The patient is very anxious to go home as he reports being unaware he was going to have to remain admitted an additional night. He has been NPO since midnight and given that his enzymes have been negative thus far, could possibly consider stress testing today if outpatient schedule allows. Will review with Dr. Domenic Polite.  - continue current medication regimen including ASA (reduce to 81mg  daily), Plavix, BB, and statin therapy.   2. PVD - s/p prior bilateral SFA interventions. Denies any recent claudication symptoms. Previously followed by Dr. [name].  - remains on ASA, Plavix, and statin therapy.   3. HTN - BP has been stable at 107/56 -134/65 since admission. Continue PTA Toprol-XL 25 mg daily.  4. HLD - Repeat FLP pending. Goal LDL is less than 70 in the setting of known CAD. Has been continued on PTA Crestor 20 mg daily.  5. Type 2 DM - Hgb A1c elevated to 9.5 in 10/2017. SSI ordered this admission.    For questions or updates, please contact [place] Please consult [email or web address] for contact info under Cardiology/STEMI.  Signed, Erma Heritage, PA-C 09/10/2018, 9:36 AM Pager: [phone]   Attending note:  Patient seen and examined.  I reviewed records and discussed the case with Ms. Ahmed Prima PA-C.  Mr. Weidinger is a patient of Dr. Debara Pickett with a history of CABG in 2014 as detailed above.  He states that he has generally done well on medical therapy since that time.  Over the last week as he has been experiencing fairly brief episodes of right to left chest discomfort, also increasing sense of shortness of breath with activity.  He reports no changes in his medications, has not used nitroglycerin.  He came to the ER during the early morning hours, was up watching television when he had recurring symptoms.  Nitroglycerin has been somewhat helpful although he has had some brief fleeting episodes of chest pain in the ER.  Cardiac enzymes are negative for ACS and his ECG shows no acute ST segment changes.  On examination he reports no active chest pain.  Heart rate is in the 70s to 80s in sinus rhythm by telemetry which I personally reviewed.  Systolic pressures been running from 120-170.  Lungs are clear without labored breathing.  Cardiac service RRR without gallop or murmur.  Lab work shows potassium 3.7, BUN 13, creatinine 0.77, troponin I less than 0.03 with 2- point-of-care levels, hemoglobin 15, platelets 168.  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  Chest x-ray reports no acute process.  She presents with recent Elijio Miles exertion and atypical chest pain as outlined.  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  He has a history of CABG in 2014 as outlined above.  Plan is to proceed with a Lexiscan Myoview today for ischemic assessment and determine follow-up course from there.  Satira Sark, M.D., F.A.C.C.

## 2018-09-10 NOTE — ED Notes (Signed)
Pt taking to have stress test done.

## 2018-09-10 NOTE — Progress Notes (Signed)
*  PRELIMINARY RESULTS* Echocardiogram 2D Echocardiogram has been performed.  Samuel Germany 09/10/2018, 2:34 PM

## 2018-09-10 NOTE — Care Management Obs Status (Signed)
[facility] NOTIFICATION   Patient Details  Name: [name] MRN: [id number] Date of Birth: 10/12/1960   Medicare Observation Status Notification Given:       Shelda Altes 09/10/2018, 3:05 PM

## 2018-09-10 NOTE — Progress Notes (Signed)
    Patient's stress test showed:    No diagnostic ST segment changes to indicate ischemia.  Very small, mild intensity, apical inferior and anterior defects that are partially reversible and most consistent with variable soft tissue attenuation rather than ischemia. No large ischemic territories are noted.  This is a low risk study.  Nuclear stress EF: 70%.   Reviewed with Dr. Domenic Polite and no further inpatient testing indicated at this time. Would start Imdur for antianginal benefit while continuing on ASA (reduce dosing to 81mg  daily), Plavix, BB, and statin therapy. He already has scheduled follow-up with Dr. Debara Pickett on 10/06/2018.  [facility] will sign off.   Medication Recommendations: As above.  Follow up as an outpatient:  With Dr. Debara Pickett on 10/06/2018.  Signed, Erma Heritage, PA-C 09/10/2018, 2:36 PM Pager: [phone]

## 2018-09-10 NOTE — Progress Notes (Addendum)
Inpatient Diabetes Program Recommendations  AACE/ADA: New Consensus Statement on Inpatient Glycemic Control (2015)  Target Ranges:  Prepandial:   less than 140 mg/dL      Peak postprandial:   less than 180 mg/dL (1-2 hours)      Critically ill patients:  140 - 180 mg/dL   Lab Results  Component Value Date   GLUCAP 308 (H) 09/10/2018   HGBA1C 9.5 (H) 10/23/2017    Review of Glycemic Control Results for [name], [name] (MRN [id number]) as of 09/10/2018 10:13  Ref. Range 09/10/2018 05:24  Glucose-Capillary Latest Ref Range: 70 - 99 mg/dL 308 (H)   Diabetes history: DM 2 Outpatient Diabetes medications:  Humalog 75/25 30 units q AM/60 units q PM Current orders for Inpatient glycemic control:  Novolog moderate tid with meals and HS Inpatient Diabetes Program Recommendations:   While in the hospital, consider adding Levemir 22 units bid.  Also consider adding Novolog 6 units tid with meals (hold if patient eats less than 50%).  Please recheck A1C.   Thanks,  Adah Perl, RN, BC-ADM Inpatient Diabetes Coordinator Pager [phone] (8a-5p)

## 2018-09-10 NOTE — H&P (Signed)
History and Physical  [name] CBJ:[id number] DOB: 1961-02-04 DOA: 09/10/2018   PCP: Sinda Du, MD   Patient coming from: Home  Chief Complaint: chest pain  HPI:  [name] is a 58 y.o. male with medical history of coronary disease, peripheral arterial disease, COPD, tobacco use, hypertension, diabetes mellitus, hyperlipidemia presenting with chest pain that began at 3 AM on the morning of 09/10/2018.  The patient states that he has been having intermittent chest pain for the past 2 to 3 weeks associated with rest as well as exertion.  He has noted some increased dyspnea on exertion for the past 3 weeks.  He continues to smoke approximately 1 pack/day.  He denies any fevers, chills, hemoptysis, nausea, vomiting, diarrhea, abdominal pain, dysuria, hematuria.  The patient states that his episode of chest pain is sharp in nature moderate in intensity that started around 3 AM while he was sitting in a chair watching television.  He had radiation to this left shoulder.  He had some nausea without any worsening shortness of breath or diaphoresis.  EMS was activated.  The patient was given nitroglycerin with improvement of his chest pain.  He endorses compliance with all his medications.  He stated the episode lasted approximately 30 minutes. In the emergency department, the patient was afebrile hemodynamically stable saturating 98% room air.  BMP showed sodium 133.  LFTs and CBC were essentially unremarkable.  Initial troponin was negative.  EKG shows sinus rhythm without any ST-T wave changes.  Chest x-ray was negative.  Assessment/Plan: Chest pain -Patient has significant cardiac risk factors with a history of CABG 2014 -Cycle troponins -Echocardiogram -Consult cardiology -Hemoglobin A1c -Lipid panel  Diabetes mellitus type 2 -Hemoglobin A1c -NovoLog sliding scale  Hyperlipidemia -Continue statin  Coronary artery disease -Continue aspirin and Plavix -Continue metoprolol  succinate  Peripheral arterial disease -Continue aspirin and Plavix  Tobacco abuse I have discussed tobacco cessation with the patient.  I have counseled the patient regarding the negative impacts of continued tobacco use including but not limited to lung cancer, COPD, and cardiovascular disease.  I have discussed alternatives to tobacco and modalities that may help facilitate tobacco cessation including but not limited to biofeedback, hypnosis, and medications.  Total time spent with tobacco counseling was 4 minutes.         Past Medical History:  Diagnosis Date  . Arthritis    "some; in hips sometimes" (01/02/2013)  . Back pain    "w/prolonged walking or standing" (01/02/2013)  . CAD (coronary artery disease)    CABG X 4 08/2012  . COPD (chronic obstructive pulmonary disease) ([place])   . Esophageal erosions 05/24/01   egd by Dr. Gala Romney  . GERD (gastroesophageal reflux disease)   . H/O hiatal hernia   . History of gout   . Hyperlipidemia   . Hypertension    not on any medication for htn at present  . PAD (peripheral artery disease) ([place])    LEA DOPPLER, 11/11/2012 - moderate arterial insufficiency to lower extremities at rest, BILATERAL SFA-demonstrates occlusive disease with reconstitution of flow  . Tobacco abuse   . Tubular adenoma of colon 08/28/11  . Type II diabetes mellitus ([place])   . Unintentional weight loss    Past Surgical History:  Procedure Laterality Date  . ANTERIOR CERVICAL DECOMP/DISCECTOMY FUSION  12/19/2011   Procedure: ANTERIOR CERVICAL DECOMPRESSION/DISCECTOMY FUSION 2 LEVELS;  Surgeon: Floyce Stakes, MD;  Location: [facility];  Service: Neurosurgery;  Laterality: N/A;  Cervical four-five Cervical five-six  Anterior cervical decompression/diskectomy, fusion, plate  . ATHERECTOMY N/A 01/02/2013   Procedure: ATHERECTOMY;  Surgeon: Lorretta Harp, MD;  Location: [facility] CATH LAB;  Service: Cardiovascular;  Laterality: N/A;  . BACK SURGERY     x3  . CARDIAC  CATHETERIZATION  08/19/2012   CABG warranted; Severe ostial LCx stenosis with mid to distal vessel occlusion  . COLONOSCOPY W/ POLYPECTOMY  08/28/11   Rourk-tubular adenomas removed from ascending colon, suboptimal prep, diminutive rectal polyps  . COLONOSCOPY WITH PROPOFOL N/A 06/13/2017   Procedure: COLONOSCOPY WITH PROPOFOL;  Surgeon: Daneil Dolin, MD;  Location: [facility];  Service: Endoscopy;  Laterality: N/A;  9:15am  . CORONARY ARTERY BYPASS GRAFT  08/22/2012   Procedure: CORONARY ARTERY BYPASS GRAFTING (CABG);  Surgeon: Grace Isaac, MD;  Location: [place];  Service: Open Heart Surgery;  Laterality: N/A;  CABG x four,  using left internal mammary artery and right leg greater saphenous vein harvested endoscopically  . ESOPHAGOGASTRODUODENOSCOPY  05/24/01   by Dr. Gala Romney  . ESOPHAGOGASTRODUODENOSCOPY  08/28/11   Rourk-erosive reflux esophagitis, gastric erosions  . ESOPHAGOGASTRODUODENOSCOPY (EGD) WITH PROPOFOL N/A 06/13/2017   Procedure: ESOPHAGOGASTRODUODENOSCOPY (EGD) WITH PROPOFOL;  Surgeon: Daneil Dolin, MD;  Location: [facility];  Service: Endoscopy;  Laterality: N/A;  . INTRAOPERATIVE TRANSESOPHAGEAL ECHOCARDIOGRAM  08/22/2012   Procedure: INTRAOPERATIVE TRANSESOPHAGEAL ECHOCARDIOGRAM;  Surgeon: Grace Isaac, MD;  Location: [place];  Service: Open Heart Surgery;  Laterality: N/A;  . LEFT HEART CATHETERIZATION WITH CORONARY ANGIOGRAM N/A 08/19/2012   Procedure: LEFT HEART CATHETERIZATION WITH CORONARY ANGIOGRAM;  Surgeon: [name], MD;  Location: [facility] CATH LAB;  Service: Cardiovascular;  Laterality: N/A;  . LOWER EXTREMITY ANGIOGRAM N/A 11/17/2012   Procedure: LOWER EXTREMITY ANGIOGRAM;  Surgeon: Lorretta Harp, MD;  Location: [facility] CATH LAB;  Service: Cardiovascular;  Laterality: N/A;  . LOWER EXTREMITY ANGIOGRAM N/A 02/02/2013   Procedure: LOWER EXTREMITY ANGIOGRAM;  Surgeon: Lorretta Harp, MD;  Location: [facility] CATH LAB;  Service: Cardiovascular;  Laterality: N/A;  .  LUMBAR [place]    . LUMBAR FUSION  ~ 2011  . PELVIC ABCESS DRAINAGE     x2  . POLYPECTOMY  06/13/2017   Procedure: POLYPECTOMY;  Surgeon: Daneil Dolin, MD;  Location: [facility];  Service: Endoscopy;;  colon  . SHOULDER SURGERY Right   . VASCULAR SURGERY Right 01/02/2013   diamondback orbital rotational  atherectomy, chocolate balloon and IDEV  Stent.   Social History:  reports that he has been smoking cigarettes. He has a 39.00 pack-year smoking history. He has never used smokeless tobacco. He reports current alcohol use of about 6.0 standard drinks of alcohol per week. He reports that he does not use drugs.   Family History  Problem Relation Age of Onset  . Anesthesia problems Neg Hx   . Colon cancer Neg Hx   . Gastric cancer Neg Hx   . Esophageal cancer Neg Hx      No Known Allergies   Prior to Admission medications   Medication Sig Start Date End Date Taking? Authorizing Provider  clopidogrel (PLAVIX) 75 MG tablet Take 1 tablet (75 mg total) by mouth daily with breakfast. 05/03/15   Hilty, Nadean Corwin, MD  cyclobenzaprine (FLEXERIL) 10 MG tablet Take 1 tablet (10 mg total) by mouth 3 (three) times daily as needed for muscle spasms. 10/31/17   Newman Pies, MD  docusate sodium (COLACE) 100 MG capsule Take 1  capsule (100 mg total) by mouth 2 (two) times daily. 10/31/17   Newman Pies, MD  insulin aspart (NOVOLOG) 100 UNIT/ML injection Inject 10-25 Units into the skin See admin instructions. 10-15 units in the morning, 25 units at bedtime    [provider]  metoprolol succinate (TOPROL-XL) 25 MG 24 hr tablet Take 1 tablet (25 mg total) by mouth daily. 10/11/17   Hilty, Nadean Corwin, MD  oxyCODONE (OXY IR/ROXICODONE) 5 MG immediate release tablet Take 1 tablet (5 mg total) by mouth every 4 (four) hours as needed for moderate pain ((score 4 to 6)). 10/31/17   Newman Pies, MD  pantoprazole (PROTONIX) 40 MG tablet Take 1 tablet (40 mg total) by mouth daily. 05/16/17    Carlis Stable, NP  PROAIR HFA 108 (90 BASE) MCG/ACT inhaler Inhale 2 puffs into the lungs every 6 (six) hours as needed for wheezing or shortness of breath.  08/14/13   [provider]  rosuvastatin (CRESTOR) 20 MG tablet Take 20 mg by mouth daily.    [provider]  zolpidem (AMBIEN CR) 12.5 MG CR tablet Take 12.5 mg by mouth at bedtime as needed for sleep.  09/19/15   [provider]    Review of Systems:  Constitutional:  No weight loss, night sweats, Fevers, chills, fatigue.  Head&Eyes: No headache.  No vision loss.  No eye pain or scotoma ENT:  No Difficulty swallowing,Tooth/dental problems,Sore throat,  No ear ache, post nasal drip,  Cardio-vascular:  No Orthopnea, PND, swelling in lower extremities,  dizziness, palpitations  GI:  No  abdominal pain, nausea, vomiting, diarrhea, loss of appetite, hematochezia, melena, heartburn, indigestion, Resp:  No coughing up of blood .No wheezing.No chest wall deformity  Skin:  no rash or lesions.  GU:  no dysuria, change in color of urine, no urgency or frequency. No flank pain.  Musculoskeletal:  No joint pain or swelling. No decreased range of motion. No back pain.  Psych:  No change in mood or affect. No depression or anxiety. Neurologic: No headache, no dysesthesia, no focal weakness, no vision loss. No syncope  Physical Exam: Vitals:   09/10/18 0535 09/10/18 0545 09/10/18 0600 09/10/18 0615  BP: (!) 124/58 113/62 (!) 127/56 117/65  Pulse: 69 64 60 61  Resp: (!) 22 20 20  (!) 22  SpO2: 97% 98% 94% 97%  Weight:      Height:       General:  A&O x 3, NAD, nontoxic, pleasant/cooperative Head/Eye: No conjunctival hemorrhage, no icterus, [place]/AT, No nystagmus ENT:  No icterus,  No thrush, good dentition, no pharyngeal exudate Neck:  No masses, no lymphadenpathy, no bruits CV:  RRR, no rub, no gallop, no S3 Lung: Diminished breath sounds, but CTAB, good air movement, no wheeze, no rhonchi Abdomen: soft/NT,  +BS, nondistended, no peritoneal signs Ext: No cyanosis, No rashes, No petechiae, No lymphangitis, No edema Neuro: CNII-XII intact, strength 4/5 in bilateral upper and lower extremities, no dysmetria  Labs on Admission:  Basic Metabolic Panel: Recent Labs  Lab 09/10/18 0606  NA 133*  K 3.7  CL 98  CO2 22  GLUCOSE 267*  BUN 13  CREATININE 0.77  CALCIUM 8.7*   Liver Function Tests: Recent Labs  Lab 09/10/18 0606  AST 16  ALT 18  ALKPHOS 93  BILITOT 0.4  PROT 7.2  ALBUMIN 3.9   No results for input(s): LIPASE, AMYLASE in the last 168 hours. No results for input(s): AMMONIA in the last 168 hours. CBC: Recent  Labs  Lab 09/10/18 0606  WBC 7.0  HGB 15.0  HCT 44.6  MCV 91.8  PLT 168   Coagulation Profile: No results for input(s): INR, PROTIME in the last 168 hours. Cardiac Enzymes: Recent Labs  Lab 09/10/18 0606  TROPONINI <0.03   BNP: Invalid input(s): POCBNP CBG: Recent Labs  Lab 09/10/18 0524  GLUCAP 308*   Urine analysis:    Component Value Date/Time   COLORURINE YELLOW 07/21/2013 1810   APPEARANCEUR CLEAR 07/21/2013 1810   LABSPEC <1.005 (L) 07/21/2013 1810   PHURINE 6.5 07/21/2013 1810   GLUCOSEU >1000 (A) 07/21/2013 1810   HGBUR NEGATIVE 07/21/2013 1810   BILIRUBINUR NEGATIVE 07/21/2013 1810   KETONESUR NEGATIVE 07/21/2013 1810   PROTEINUR NEGATIVE 07/21/2013 1810   UROBILINOGEN 0.2 07/21/2013 1810   NITRITE NEGATIVE 07/21/2013 1810   LEUKOCYTESUR NEGATIVE 07/21/2013 1810   Sepsis Labs: @LABRCNTIP (procalcitonin:4,lacticidven:4) )No results found for this or any previous visit (from the past 240 hour(s)).   Radiological Exams on Admission: Dg Chest Portable 1 View  Result Date: 09/10/2018 CLINICAL DATA:  Chest pain EXAM: PORTABLE CHEST 1 VIEW COMPARISON:  07/24/2018 FINDINGS: Normal heart size and mediastinal contours. CABG. No acute infiltrate or edema. No effusion or pneumothorax. No acute osseous findings. IMPRESSION: No evidence of  active disease. Electronically Signed   By: Monte Fantasia M.D.   On: 09/10/2018 05:59    EKG: Independently reviewed. Sinus, no STT changes    Time spent:60 minutes Code Status:   FULL Family Communication:  No Family at bedside Disposition Plan: expect 1-2 day hospitalization Consults called: cardiology DVT Prophylaxis: [place] Lovenox  Orson Eva, DO  [facility] Pager [phone]  If 7PM-7AM, please contact night-coverage [email or web address] Password [email or web address] 09/10/2018, 7:26 AM

## 2018-09-11 LAB — HIV ANTIBODY (ROUTINE TESTING W REFLEX): HIV Screen 4th Generation wRfx: NONREACTIVE

## 2018-10-06 ENCOUNTER — Encounter: Payer: Self-pay | Admitting: Internal Medicine

## 2018-10-06 ENCOUNTER — Ambulatory Visit (INDEPENDENT_AMBULATORY_CARE_PROVIDER_SITE_OTHER): Payer: Medicare Other | Admitting: Internal Medicine

## 2018-10-06 ENCOUNTER — Encounter: Payer: Self-pay | Admitting: *Deleted

## 2018-10-06 VITALS — BP 148/78 | HR 70 | Ht 71.0 in | Wt 183.2 lb

## 2018-10-06 DIAGNOSIS — E782 Mixed hyperlipidemia: Secondary | ICD-10-CM

## 2018-10-06 DIAGNOSIS — I739 Peripheral vascular disease, unspecified: Secondary | ICD-10-CM | POA: Diagnosis not present

## 2018-10-06 DIAGNOSIS — Z79899 Other long term (current) drug therapy: Secondary | ICD-10-CM | POA: Diagnosis not present

## 2018-10-06 DIAGNOSIS — Z794 Long term (current) use of insulin: Secondary | ICD-10-CM

## 2018-10-06 DIAGNOSIS — IMO0001 Reserved for inherently not codable concepts without codable children: Secondary | ICD-10-CM

## 2018-10-06 DIAGNOSIS — I10 Essential (primary) hypertension: Secondary | ICD-10-CM

## 2018-10-06 DIAGNOSIS — Z006 Encounter for examination for normal comparison and control in clinical research program: Secondary | ICD-10-CM

## 2018-10-06 DIAGNOSIS — E1165 Type 2 diabetes mellitus with hyperglycemia: Secondary | ICD-10-CM

## 2018-10-06 DIAGNOSIS — Z951 Presence of aortocoronary bypass graft: Secondary | ICD-10-CM | POA: Diagnosis not present

## 2018-10-06 MED ORDER — ICOSAPENT ETHYL 1 G PO CAPS: 2.0000 g | ORAL_CAPSULE | Freq: Two times a day (BID) | ORAL | 3 refills | Status: DC

## 2018-10-06 MED ORDER — LOSARTAN POTASSIUM 25 MG PO TABS: 25.0000 mg | ORAL_TABLET | Freq: Every day | ORAL | 3 refills | Status: DC

## 2018-10-06 MED ORDER — EMPAGLIFLOZIN 10 MG PO TABS: 10.0000 mg | ORAL_TABLET | Freq: Every day | ORAL | 3 refills | Status: DC

## 2018-10-06 NOTE — Progress Notes (Signed)
OFFICE NOTE  Chief Complaint:  Hospital follow-up  Primary Care Physician: Sinda Du, MD  HPI:  [name] is a 58 year old mildly overweight single, though lives with his girlfriend, Caucasian male father of one child seen for peripheral vascular disease because of claudication and diminished ABIs. Risk factors include continued tobacco abuse of one pack -2 pks per day, diabetes and hyperlipidemia. He was catheterized by Dr. Nicanor Bake 08/19/2012 demonstrated three-vessel disease. He ultimately underwent coronary artery bypass grafting x4 January 17 for LIMA to his LAD, a vein to the diagonal branch, ramus intermedius branch and to the RCA. He tolerated the procedure well. His EF was 50-55% by 2-D echo. Dopplers revealed ABIs of 0.5 and 0.7. He complaind of left greater than right claudication at one block. He underwent percutaneous revascularization using the Viance CTO catheter, diamondback orbital rotational atherectomy, chocolate balloon and IDEV Stent successfully to the mid Rt SFA. His main complaints are ongoing lower extremity swelling. This is fatigue and problems with his hips. He denies any chest pain or worsening shortness of breath.  He was recently sent to [facility] for additional peripheral arterial procedures to open the left SFA by Dr. [name] as Dr. [name] had great difficulty in crossing the lesion. Ultimately after 6 hours Dr. [name] was able to get across the lesion and improve arterial flow. Repeat Dopplers were performed recently demonstrating an ABI of 1.1 on the right and 1.0 on the left in September 2014.  He is also wondering today whether he can stop his Plavix.  [name] returns today for followup. He is doing quite well. He recent saw Dr. Luan Pulling. He was concerned about his ongoing smoking. He reports his blood sugars are improved however they are still not at goal. He denies any claudication symptoms and had ultrasound in March of this year which shows excellent  blood flow to his legs after stenting.   Saw Mr. Minkin back today for annual follow-up. Several weeks ago he had some electrical chest pain which lasted for just a few seconds while sitting on the couch. Otherwise he has no symptoms. Recently he's lost about 3-4 pounds every month for the past several months. He's plagued by chronic back pain and seems to have a good appetite. He's had significant workup including chest CT endoscopy and other tests including extensive lab work without any clear etiology of his weight loss. He is struggling from back pain and is planning to go see his neurosurgeon. He reports weakness and numbness and tingling in his legs.   10/04/2016  [name] returns today for follow-up. Overall he seems to be doing well. He continues to get some pain in his legs which is worse after he rests and is sitting for a period of time. He says it's like a burning sensation that gets better. He denies any claudication symptoms and had recent lower extremity arterial Dopplers which showed patent waveforms and normal blood flow. He was recently reassessed with regards to cholesterol and it remains elevated. He was previously on cholesterol medicine but that was discontinued for unknown reasons. He is now back on Crestor 20 mg daily. He's managed to cut back his smoking to half pack per day but is not yet motivated to quit. He understands the significant cardiovascular complications related to this. He is also on insulin-dependent diabetic and is not at goal A1c, recently he reported being over 8.  10/11/2017  [name] is seen today for annual follow-up.  Incidentally  he recently has been having back problems and was seen by Dr. Arnoldo Morale.  He will likely need repeat back surgery based on his MRI findings.  He had previously had surgery by Dr. Joya Salm, who is retired.  He was referred for preoperative risk assessment.  He has a history of CABG as previously mentioned and he is on Plavix.  In addition he  has peripheral arterial disease.  Currently denies any claudication, chest pain or worsening shortness of breath, however over the winter he is not been very active.  In addition he is still a smoker but has cut back his smoking.  He understands that he is at high risk of poor wound healing is a smoker and but not only benefit from smoking cessation for surgery but as well as a lifetime benefit.  10/06/2018  [name] is seen today for hospital follow-up.  Recently seen in the hospital in February and admitted for chest pain.  He described as shooting chest pain which started the right side of his chest and went across.  He ultimately underwent nuclear stress testing which was negative for ischemia and EF was 70%.  He ruled out for acute MI.  He was subsequently discharged to follow-up with me.  His lab work however showed significant abnormalities, including marked dyslipidemia with a total cholesterol 196, HDL 40, LDL 87 and triglycerides 343.  His hemoglobin A1c is also poorly controlled at 9.5.  He has been complaining also of pain particularly in his left leg which is worse with exertion.  He does have a history of bilateral SFA stenting and PAD and had nonobstructive Dopplers in 2018.  PMHx:  Past Medical History:  Diagnosis Date  . Arthritis    "some; in hips sometimes" (01/02/2013)  . Back pain    "w/prolonged walking or standing" (01/02/2013)  . CAD (coronary artery disease)    CABG X 4 08/2012  . COPD (chronic obstructive pulmonary disease) ([place])   . Esophageal erosions 05/24/01   egd by Dr. Gala Romney  . GERD (gastroesophageal reflux disease)   . H/O hiatal hernia   . History of gout   . Hyperlipidemia   . Hypertension    not on any medication for htn at present  . PAD (peripheral artery disease) ([place])    LEA DOPPLER, 11/11/2012 - moderate arterial insufficiency to lower extremities at rest, BILATERAL SFA-demonstrates occlusive disease with reconstitution of flow  . Tobacco abuse   .  Tubular adenoma of colon 08/28/11  . Type II diabetes mellitus ([place])   . Unintentional weight loss     Past Surgical History:  Procedure Laterality Date  . ANTERIOR CERVICAL DECOMP/DISCECTOMY FUSION  12/19/2011   Procedure: ANTERIOR CERVICAL DECOMPRESSION/DISCECTOMY FUSION 2 LEVELS;  Surgeon: Floyce Stakes, MD;  Location: [facility];  Service: Neurosurgery;  Laterality: N/A;  Cervical four-five Cervical five-six  Anterior cervical decompression/diskectomy, fusion, plate  . ATHERECTOMY N/A 01/02/2013   Procedure: ATHERECTOMY;  Surgeon: Lorretta Harp, MD;  Location: [facility] CATH LAB;  Service: Cardiovascular;  Laterality: N/A;  . BACK SURGERY     x3  . CARDIAC CATHETERIZATION  08/19/2012   CABG warranted; Severe ostial LCx stenosis with mid to distal vessel occlusion  . COLONOSCOPY W/ POLYPECTOMY  08/28/11   Rourk-tubular adenomas removed from ascending colon, suboptimal prep, diminutive rectal polyps  . COLONOSCOPY WITH PROPOFOL N/A 06/13/2017   Procedure: COLONOSCOPY WITH PROPOFOL;  Surgeon: Daneil Dolin, MD;  [facility];  Service: Endoscopy;  Laterality:  N/A;  9:15am  . CORONARY ARTERY BYPASS GRAFT  08/22/2012   Procedure: CORONARY ARTERY BYPASS GRAFTING (CABG);  Surgeon: Grace Isaac, MD;  Location: [place];  Service: Open Heart Surgery;  Laterality: N/A;  CABG x four,  using left internal mammary artery and right leg greater saphenous vein harvested endoscopically  . ESOPHAGOGASTRODUODENOSCOPY  05/24/01   by Dr. Gala Romney  . ESOPHAGOGASTRODUODENOSCOPY  08/28/11   Rourk-erosive reflux esophagitis, gastric erosions  . ESOPHAGOGASTRODUODENOSCOPY (EGD) WITH PROPOFOL N/A 06/13/2017   Procedure: ESOPHAGOGASTRODUODENOSCOPY (EGD) WITH PROPOFOL;  Surgeon: Daneil Dolin, MD;  [facility];  Service: Endoscopy;  Laterality: N/A;  . INTRAOPERATIVE TRANSESOPHAGEAL ECHOCARDIOGRAM  08/22/2012   Procedure: INTRAOPERATIVE TRANSESOPHAGEAL ECHOCARDIOGRAM;  Surgeon: Grace Isaac,  MD;  Location: [place];  Service: Open Heart Surgery;  Laterality: N/A;  . LEFT HEART CATHETERIZATION WITH CORONARY ANGIOGRAM N/A 08/19/2012   Procedure: LEFT HEART CATHETERIZATION WITH CORONARY ANGIOGRAM;  Surgeon: [name], MD;  Location: [facility] CATH LAB;  Service: Cardiovascular;  Laterality: N/A;  . LOWER EXTREMITY ANGIOGRAM N/A 11/17/2012   Procedure: LOWER EXTREMITY ANGIOGRAM;  Surgeon: Lorretta Harp, MD;  Location: [facility] CATH LAB;  Service: Cardiovascular;  Laterality: N/A;  . LOWER EXTREMITY ANGIOGRAM N/A 02/02/2013   Procedure: LOWER EXTREMITY ANGIOGRAM;  Surgeon: Lorretta Harp, MD;  Location: [facility] C[facility] LAB;  Service: Cardiovascular;  Laterality: N/A;  . LUMBAR [place]    . LUMBAR FUSION  ~ 2011  . PELVIC ABCESS DRAINAGE     x2  . POLYPECTOMY  06/13/2017   Procedure: POLYPECTOMY;  Surgeon: Daneil Dolin, MD;  [facility];  Service: Endoscopy;;  colon  . SHOULDER SURGERY Right   . VASCULAR SURGERY Right 01/02/2013   diamondback orbital rotational  atherectomy, chocolate balloon and IDEV  Stent.    FAMHx:  Family History  Problem Relation Age of Onset  . Anesthesia problems Neg Hx   . Colon cancer Neg Hx   . Gastric cancer Neg Hx   . Esophageal cancer Neg Hx     SOCHx:   reports that he has been smoking cigarettes. He has a 39.00 pack-year smoking history. He has never used smokeless tobacco. He reports current alcohol use of about 6.0 standard drinks of alcohol per week. He reports that he does not use drugs.  ALLERGIES:  No Known Allergies  ROS: Pertinent items noted in HPI and remainder of comprehensive ROS otherwise negative.  HOME MEDS: Current Outpatient Medications  Medication Sig Dispense Refill  . aspirin EC 81 MG EC tablet Take 1 tablet (81 mg total) by mouth daily.    . clopidogrel (PLAVIX) 75 MG tablet Take 1 tablet (75 mg total) by mouth daily with breakfast. 30 tablet 11  . cyclobenzaprine (FLEXERIL) 10 MG tablet Take 1 tablet (10 mg  total) by mouth 3 (three) times daily as needed for muscle spasms. 50 tablet 1  . docusate sodium (COLACE) 100 MG capsule Take 1 capsule (100 mg total) by mouth 2 (two) times daily. 60 capsule 0  . gabapentin (NEURONTIN) 300 MG capsule Take 1 capsule by mouth daily.    Marland Kitchen HUMALOG MIX 75/25 KWIKPEN (75-25) 100 UNIT/ML Kwikpen Inject 30-60 Units into the skin 2 (two) times daily. Patient takes 30 units in the morning and 60 units at night    . isosorbide mononitrate (IMDUR) 30 MG 24 hr tablet Take 1 tablet (30 mg total) by mouth daily. 30 tablet 1  . metoprolol succinate (TOPROL-XL) 25 MG  24 hr tablet Take 1 tablet (25 mg total) by mouth daily. 90 tablet 3  . oxyCODONE-acetaminophen (PERCOCET/ROXICET) 5-325 MG tablet Take 1 tablet by mouth every 6 (six) hours as needed.    Marland Kitchen PROAIR HFA 108 (90 BASE) MCG/ACT inhaler Inhale 2 puffs into the lungs every 6 (six) hours as needed for wheezing or shortness of breath.     . rosuvastatin (CRESTOR) 40 MG tablet Take 1 tablet (40 mg total) by mouth daily. 30 tablet 1  . zolpidem (AMBIEN CR) 12.5 MG CR tablet Take 12.5 mg by mouth at bedtime as needed for sleep.      No current facility-administered medications for this visit.     LABS/IMAGING: No results found for this or any previous visit (from the past 48 hour(s)). No results found.  VITALS: BP (!) 148/78   Pulse 70   Ht 5\' 11"  (1.803 m)   Wt 183 lb 3.2 oz (83.1 kg)   SpO2 94%   BMI 25.55 kg/m   EXAM: General appearance: alert and no distress Neck: no JVD and supple, symmetrical, trachea midline Lungs: clear to auscultation bilaterally Heart: regular rate and rhythm, S1, S2 normal, no murmur, click, rub or gallop Abdomen: soft, non-tender; bowel sounds normal; no masses,  no organomegaly Extremities: edema 1+ bilaterally, bilateral leg muscle atrophy Pulses: 2+ and symmetric Skin: Skin color, texture, turgor normal. No rashes or lesions Neurologic: Grossly normal Psych: Mildy  irritable  EKG: Deferred  ASSESSMENT: 1. Coronary disease status post four-vessel CABG in 2014 2. 60 pack year tobacco user - cut back to 1/2 ppd 3. Insulin-dependent diabetes 4. Dyslipidemia 5. Significant PAD status post bilateral SFA intervention 6. Lower extremity edema  PLAN: 1.   [name] recently was in the hospital for chest pain although ruled out for MI and had a negative stress test with normal LV function.  He continues to have pain with walking particular in the left hip which she is down to the leg.  Is difficult to ascertain if this is related to sciatica or perhaps his prior SFA interventions.  I like for him to repeat lower extremity arterial Dopplers since he has cool feet and decreased pulses and had prior bilateral SFA intervention with his last Dopplers in 2018.  Additionally, his diabetes is also suboptimally controlled with hemoglobin A1c of 9.5 on insulin.  This was recently increased by his PCP.  I think he is a good candidate for our coordinate diabetes study.  This would recommend adding SGL T2 or GLP-1 medications to his treatment.  I would favor Jardiance 10 mg daily in addition to his current regimen.  He will continue on insulin.  He is advised to drink 4 to 5, 16 ounce glasses of water a day and he was cautioned to watch out for urinary tract infections and the possibility of Fournier's gangrene.  Finally, triglycerides remain elevated with persistent cardiovascular risk.  His LDL is near goal at 87.  Although we could consider adding on PCSK9 inhibitor therapy to his current regimen, would favor Vascepa given the very high triglycerides.  This is 2 g twice daily.  Follow-up with me in 3 months with a repeat lipid profile, direct LDL and hemoglobin A1c.  In addition we will check a metabolic profile in a week after starting his Jardiance to make sure he is not becoming too dehydrated.  Further blood sugar checks as normal through his primary care provider.  As per  protocol given that he is  diabetic, ACE inhibitor or ARB are recommended and not contraindicated.  His renal function is normal.  We will add losartan 25 mg daily.  [name], MD, [facility], [place] Director of the Advanced Lipid Disorders &  Cardiovascular Risk Reduction Clinic Diplomate of the American Board of Clinical Lipidology Attending Cardiologist  Direct Dial: [phone]  Fax: [phone]  Website:  [email or web address] 10/06/2018, 2:06 PM

## 2018-10-06 NOTE — Research (Signed)
COORDINATE-Diabetes BASELINE CASE REPORT FORM (Intervention) Site #:  [id number]              Patient ID: [id number]   INCLUSION CRITERIA  1. Is patient 18 years or older? '[]'$ No   '[x]'$ Yes  2. Based on the clinical record, does the patient have a history of type 2 diabetes mellitus? '[]'$ No   '[x]'$ Yes  3. Based on the clinical record, does the patient have a history of atherosclerotic cardiovascular disease? '[]'$ No   '[x]'$ Yes                    4. Patient's baseline guideline-based therapy score:    1 point Currently prescribed angiotensin converting enzyme inhibitor (ACEi), angiotensin receptor blocker (ARB) or Angiotensin Receptor Neprilysin inhibitor (ARNi) therapy '[x]'$ No '[]'$  Yes   1 point Currently prescribed high intensity statin therapy (atorvastatin 40-'80mg'$  daily OR rosuvastatin 20-'40mg'$  daily) '[]'$ No '[x]'$  Yes   1 point Most recent HbA1c <7% on metformin monotherapy? '[x]'$ No '[]'$  Yes    Calculated score: 1    EXCLUSION CRITERIA  REVIEW EACH CONDITION AND SELECT YES IF THE STATEMENT IS TRUE OR NO IF THE STATEMENT IS FALSE.  1. Determined to be highly unlikely to survive and/or to continue follow-up in that clinic for at least 1 year, as identified by site investigator '[x]'$ No '[]'$ Yes  2. eGFR ? 30 ml/min/1.97m '[x]'$ No '[]'$ Yes  3. Baseline composite score of 3 for guideline recommended therapy '[x]'$ No '[]'$ Yes  4. Absolute contraindication to any of the 3 guideline recommended therapies    ACEi AND ARB therapy '[x]'$ No '[]'$  Yes    High intensity statin therapy '[x]'$ No  '[]'$ Yes    SGLT2I AND GLP1RA therapy '[x]'$ No  '[]'$ Yes   5. Already taking SGLT2i or GLP1RA therapy at baseline '[x]'$ No '[]'$ Yes     Subject Name: [name] Subject met inclusion and exclusion criteria.  The informed consent form, study requirements and expectations were reviewed with the subject and questions and concerns were addressed prior to the signing of the consent form.  The subject verbalized understanding of the trial requirements.  The subject agreed to  participate in the [place] and signed the informed consent on 10/06/18 at 1445.The informed consent was obtained prior to performance of any protocol-specific procedures for the subject.  A copy of the signed informed consent was given to the subject and a copy was placed in the subject's medical record.   [name]  MEDICAL HISTORY  Other Cardiovascular  Atrial Fibrillation or Atrial Flutter '[x]'$ No '[]'$ Yes  Hypertension '[]'$ No '[x]'$ Yes  Hyperlipidemia '[]'$ No '[x]'$ Yes  Cigarette smoking '[x]'$ Current '[]'$ Former '[]'$ Never  Non-Cardiovascular  Anemia (Hgb<lower limit normal) '[x]'$ No '[]'$ Yes  Asthma '[x]'$ No '[]'$ Yes  Chronic lung disease '[x]'$ No '[]'$ Yes  Cancer - leukemia, lymphoma, or localized solid tumor '[x]'$ No '[]'$ Yes  Cancer - metastatic solid tumor '[x]'$ No '[]'$ Yes  Mild Liver Disease (Cirrhosis without portal hypertension) '[x]'$ No '[]'$ Yes  Moderate/Severe Liver Disease (Cirrhosis with portal hypertension) '[x]'$ No '[]'$ Yes  Connective Tissue Disease '[x]'$ No '[]'$ Yes  Dementia '[x]'$ No '[]'$ Yes  Depression '[x]'$ No '[]'$ Yes  Hemiplegia or paraplegia '[x]'$ No '[]'$ Yes  Human immunodeficiency virus '[x]'$ No '[]'$ Yes  Obstructive Sleep Apnea '[x]'$ No '[]'$ Yes  Peptic Ulcer Disease '[x]'$ No '[]'$ Yes  Renal insufficiency '[x]'$ No '[]'$ Yes  Drug or Alcohol Abuse '[x]'$ Current '[]'$ Former '[]'$ Never  Diabetes History  Year of diagnosis of type 2 diabetes     Per subject diag. In 2000   History of diabetic ketoacidosis? '[x]'$ No '[]'$ Yes   Are any of the following complications of diabetes present (Select all that apply): '[]'$   Retinopathy _0 Neuropathy <[id number]>_6<\/[id number]>_7 Foot complications      (including ulcers, calluses,         amputation) _2 Gastroparesis _3 None of the above        _4 No _5 Yes  Subject educated on Coordinate diabetes and all educational materials given to subject.

## 2018-10-06 NOTE — Patient Instructions (Signed)
Medication Instructions:  Start: Vascepa 2 g twice a day           Jardiance 10 mg daily           Losartan 25 mg daily  If you need a refill on your cardiac medications before your next appointment, please call your pharmacy.   Lab work: Your physician recommends that you return for lab work in 1 week ( BMP) and 3 months ( Lipid, Direct LDL, and A1C)  If you have labs (blood work) drawn today and your tests are completely normal, you will receive your results only by: Marland Kitchen MyChart Message (if you have MyChart) OR . A paper copy in the mail If you have any lab test that is abnormal or we need to change your treatment, we will call you to review the results.  Testing/Procedures: Your physician has requested that you have a lower extremity arterial exercise duplex. During this test, exercise and ultrasound are used to evaluate arterial blood flow in the legs. Allow one hour for this exam. There are no restrictions or special instructions. [place]  Follow-Up: At [facility], you and your health needs are our priority.  As part of our continuing mission to provide you with exceptional heart care, we have created designated Provider Care Teams.  These Care Teams include your primary Cardiologist (physician) and Advanced Practice Providers (APPs -  Physician Assistants and Nurse Practitioners) who all work together to provide you with the care you need, when you need it. You will need a follow up appointment in 3 months.  Please call our office 2 months in advance to schedule this appointment.  You may see [name], MD or one of the following Advanced Practice Providers on your designated Care Team: [place], [place] . Fabian Sharp, PA-C

## 2018-10-16 ENCOUNTER — Encounter (HOSPITAL_COMMUNITY): Payer: Medicare Other

## 2018-10-21 ENCOUNTER — Other Ambulatory Visit: Payer: Self-pay | Admitting: Internal Medicine

## 2018-10-21 DIAGNOSIS — I739 Peripheral vascular disease, unspecified: Secondary | ICD-10-CM

## 2018-10-21 DIAGNOSIS — Z95820 Peripheral vascular angioplasty status with implants and grafts: Secondary | ICD-10-CM

## 2018-10-22 DIAGNOSIS — J449 Chronic obstructive pulmonary disease, unspecified: Secondary | ICD-10-CM | POA: Diagnosis not present

## 2018-10-22 DIAGNOSIS — I2510 Atherosclerotic heart disease of native coronary artery without angina pectoris: Secondary | ICD-10-CM | POA: Diagnosis not present

## 2018-10-22 DIAGNOSIS — E1165 Type 2 diabetes mellitus with hyperglycemia: Secondary | ICD-10-CM | POA: Diagnosis not present

## 2018-10-22 DIAGNOSIS — M545 Low back pain: Secondary | ICD-10-CM | POA: Diagnosis not present

## 2018-10-29 ENCOUNTER — Encounter (HOSPITAL_COMMUNITY): Payer: Medicare Other

## 2018-11-17 ENCOUNTER — Other Ambulatory Visit: Payer: Self-pay

## 2018-11-17 NOTE — Patient Outreach (Signed)
[place] [facility]) Care Management  11/17/2018  [name] 1961/05/08 [id number]   Medication Adherence call to [place] Compliant Voice message left with a call back number. [name] is showing past due on Rosuvastatin 40 mg under [place].   [name] [phone]  Fax [phone] [name]

## 2018-12-09 ENCOUNTER — Other Ambulatory Visit: Payer: Self-pay

## 2018-12-09 ENCOUNTER — Ambulatory Visit (HOSPITAL_COMMUNITY)
Admission: RE | Admit: 2018-12-09 | Discharge: 2018-12-09 | Disposition: A | Discharge status: Home / Self Care | Payer: Medicare Other | Source: Ambulatory Visit | Attending: Internal Medicine | Admitting: Internal Medicine

## 2018-12-09 DIAGNOSIS — Z95820 Peripheral vascular angioplasty status with implants and grafts: Secondary | ICD-10-CM | POA: Diagnosis not present

## 2018-12-09 DIAGNOSIS — I739 Peripheral vascular disease, unspecified: Secondary | ICD-10-CM | POA: Insufficient documentation

## 2018-12-09 NOTE — Patient Outreach (Signed)
[place] [facility]) Care Management  12/09/2018  [name] 09-25-1960 [id number]   Medication Adherence call to [name] HIPPA Compliant Voice message left with a call back number. [name] is showing pat due on Rosuvastatin 40 mg under [place].  [place] Management Direct Dial [phone]  Fax [phone] [name]

## 2018-12-25 ENCOUNTER — Other Ambulatory Visit: Payer: Self-pay | Admitting: Internal Medicine

## 2018-12-25 ENCOUNTER — Other Ambulatory Visit: Payer: Self-pay | Admitting: *Deleted

## 2018-12-25 DIAGNOSIS — I739 Peripheral vascular disease, unspecified: Secondary | ICD-10-CM

## 2019-01-12 ENCOUNTER — Other Ambulatory Visit: Payer: Self-pay | Admitting: Internal Medicine

## 2019-01-13 ENCOUNTER — Other Ambulatory Visit: Payer: Self-pay

## 2019-01-13 NOTE — Patient Outreach (Signed)
[place] [facility]) Care Management  01/13/2019  MARCELLAS MARCHANT 1961/01/17 [id number]   Medication Adherence call to Mr. Farhaan Mabee HIPPA Compliant Voice message left with a call back number. Mr. Asare is showing past due on Losartan 25 mg under [place].   [place] Management Direct Dial [phone]  Fax [phone] [email or web address]

## 2019-01-14 ENCOUNTER — Telehealth: Payer: Self-pay | Admitting: Internal Medicine

## 2019-01-14 ENCOUNTER — Telehealth: Payer: Self-pay | Admitting: *Deleted

## 2019-01-14 NOTE — Telephone Encounter (Signed)
LMTCB, when patient calls back please change appt to virtual visit with Dr. Debara Pickett, also remind him to go for lab work prior to visit.

## 2019-01-14 NOTE — Telephone Encounter (Signed)
Marland KitchenMarland KitchenMarland KitchenMarland KitchenMarland KitchenMarland KitchenMarland Kitchen   COORDINATE-Diabetes 3 Month Patient Questionnaire (Intervention) Site #:   703   Patient ID:    [id number]             9 MONTH QUESTIONNAIRE  Visit Date  01/14/2019 MM DD YYYY  Vital Status [x]   Patient Alive > Proceed to Visit Status []   Patient Dead > Complete Death Form only []   Unknown > Proceed to Visit Status  Visit Status Was the interview completed? []  No >IF NO, Select reason why [] Unable to locate                                    [] No Valid Contacts (patients or alternates) [] Multiple attempts to valid contacts []  Patient no longer cared for at study clinic []  Patient withdrew []  Other, specify:                                           >IF NO, Last date of contact: / /             MM      DD        YYYY    [x] Yes >IF YES, Select source of Interview: []   Proxy [x]   Patient     3 Month Patient Questionnaire (Intervention)  Instructions:   1. Prior to speaking with the patient either over the phone or in person, print off or have available the patient's current list of medications.      IF conducting follow-up visit over the phone - Instruct the patient to gather all their pill bottles. 2. For the purpose of the COORDINATE-Diabetes Study, please document whether the patient is currently taking each of the following medication classes. 3. DO NOT PROMPT DURING FOLLOW-UP VISIT: IF subject mentions reaction to one of the following medications, complete the AE/SAE section and follow instructions in operations manual regarding Adverse event reporting to [employer] ([facility]).  MEDICATIONS  Medication Are you currently taking [Lopsartan]? If currently taking: If started since last visit: If stopped since last visit:  ACE Inhibitor / Angiotensin Receptor Blocker (ARB) / Angiotensin Receptor Neprilysin inhibitor (ARNi) [] No >  [x] Yes > [] Stopped since last visit [] Never prescribed [] Started since last visit [x] Same medication as last     visit [] Different  medication than       last visit [Study personnel to answer]  Documented in EHR? [] No  [] Yes   If no, Verified by: [] Photo of bottle [] Copy of rx [] Dispensing       pharmacy [] Prescribing provider [] Other (specify):                    Date started:        / /             MM DD YYYY Who prescribed this medication for you? [] Cardiology provider > [] Study clinic [] Outside clinic [] Endocrinology provider [] Primary care provider [] Other provider > Specify:                             [] Unknown Date discontinued:        / /             MM DD YYYY Why did you stop taking this medication? (check all that  apply) [] Allergic reaction [] Medication side effects [] Unable to       adhere/monitor [] Had an      operation/procedure      that required      stopping it [] Unable to afford it [] No longer wants        to take        this medication [] Provider decision [] Pregnancy [] Other (specify: ) [] Unknown Reason   If started or changed  What medication are you taking now? [] Benazepril (Lotensin) [] Captopril (Capoten) [] Enalapril (Vasotec) [] Fosinopril (Monopril) [] Lisinopril (Zestril, Prinivil) [] Quinapril (Accupril) [] Ramipril (Altace) [] Azilsartan (Edarbi) [] Candesartan (Atacand) [] Irbesartan (Avapro) [] Losartan (Cozaar) [] Olmesartan (Benicar) [] Telmisartan (Micardis) [] Valsartan (Diovan) [] Sacrubitril/Valsartan Delene Loll)      Medication Are you currently taking [Rosuvastatin]? If currently taking: If started since last visit: If stopped since last visit:  Statin []  No >  [x] Yes > [] Stopped since last visit [] Never prescribed [] Started since last visit [x] Same medication as last        visit [] Different medication or dose     than last visit [Study personnel to answer]  Documented in EHR? [] No [] Yes    If no, Verified by: [] Photo of bottle [] Copy of rx [] Dispensing pharmacy [] Prescribing provider [] Other (specify):                     Date started:        / /             MM DD YYYY Who prescribed this medication for you? [] Cardiology provider  [] Study clinic [] Outside clinic [] Endocrinology provider [] Primary care provider [] Other provider  Specify:                             [] Unknown Date discontinued:        / /             MM DD YYYY Why did you stop taking this medication? (check all that apply) [] Allergic reaction [] Medication side effects  [] Muscle aches [] Weakness [] Joint pain [] Cognitive symptoms [] Other (specify):                          [] Unable to        adhere/monitor [] Had an    operation/procedure    that required stopping it [] Unable to afford it [] No longer wants       to take this            medication [] Provider decision [] Pregnancy [] Other (specify: ) [] Unknown Reason   If started or changed  What medication are you taking now? [] Atorvastatin (Lipitor) [] Fluvastatin (Lescol) [] Lovastatin (Mevacor) [] Pravastatin (Pravachol) [] Rosuvastatin (Crestor) [] Simvastatin (Zocor) [] Pitatavastatin (Livalo)  Dose: [] 1 mg  [] 10 mg [] 2 mg  []  20 mg [] 3 mg  []  40 mg [] 4 mg  []  60 mg [] 5 mg  []  80 mg  Frequency: [] Daily []  Less than daily      Medication Are you currently taking [Jardiance]? If currently taking: If started since last visit: If stopped since last visit:  SGLT2 Inhibitor [] No   [x] Yes [] Stopped since last visit [] Never prescribed [] Started since last visit [x] Same medication as last      visit [] Different medication than last visit [Study personnel to answer]  Documented in EHR? [] No [] Yes   If no, Verified by: [] Photo of bottle [] Copy of rx [] Dispensing       pharmacy [] Prescribing       provider []   Other (specify):                    Date started:        / /             MM DD YYYY Who prescribed this medication for you? [] Cardiology provider  [] Study clinic [] Outside clinic [] Endocrinology provider [] Primary care provider [] Other  provider  Specify:                             [] Unknown Date discontinued:        / /             MM DD YYYY Why did you stop taking this medication? (check all that apply) [] Allergic reaction [] Medication side effects [] Unable to       adhere/monitor [] Had an        operation/procedure         that required           stopping it [] Patient unable to       afford it [] Patient no longer       wants to       take this medication [] Provider decision [] Pregnancy [] Other (specify: ) [] Unknown Reason   If started or changed  What medication are you taking now? [] Canaglifozin (Invokana) [] Dapagliflozin (Farxiga) [] Empaglifozin (Jardiance) [] Ertugliflozin (Steglatro)     GLP1 Receptor Agonist [x] No   [] Yes [] Stopped since last visit [x] Never prescribed [] Started since last visit [] Same medication as last      visit [] Different medication than       last visit [Study personnel to answer]  Documented in EHR? [] No [] Yes   If no, Verified by: [] Photo of bottle [] Copy of rx [] Dispensing       pharmacy [] Prescribing       provider [] Other (specify):                    Date started:        / /             MM DD YYYY Who prescribed this medication for you? [] Cardiology provider  [] Study clinic [] Outside clinic [] Endocrinology provider [] Primary care provider [] Other provider  Specify:                             [] Unknown Date discontinued:        / /             MM DD YYYY Why did you stop taking this medication? (check all that apply) [] Allergic reaction [] Medication side effects [] Unable to      adhere/monitor [] Had an    operation/procedure    that required stopping it [] Patient unable to        afford it [] Patient no longer        wants to take this        medication [] Provider decision [] Pregnancy [] Other (specify: ) [] Unknown Reason   If started or changed  What medication are you taking now? [] Albiglutide (Tanzeum) [] Dulaglutide  (Trulicity) [] Exanatide (Byetta, Bydureon) [] Liraglutide (Victoza, Saxenda) [] Lixisenatide (Adlyxin) [] Semaglutice (Ozempic)     10/14/2018 (EDC Release)  Spoke with pt for Coordinate DM 3 month visit. Pt doing well and has had no change in medications. DM education given.

## 2019-01-16 NOTE — Telephone Encounter (Signed)
LMTCB to see if patient is OK with change in appt from in-office to virtual and provided a reminder that he needs fasting labs prior to appointment as well.

## 2019-01-19 ENCOUNTER — Ambulatory Visit: Payer: Medicare Other | Admitting: Internal Medicine

## 2019-01-19 NOTE — Telephone Encounter (Signed)
LMTCB - see notes below

## 2019-01-26 DIAGNOSIS — J449 Chronic obstructive pulmonary disease, unspecified: Secondary | ICD-10-CM | POA: Diagnosis not present

## 2019-01-26 DIAGNOSIS — I2510 Atherosclerotic heart disease of native coronary artery without angina pectoris: Secondary | ICD-10-CM | POA: Diagnosis not present

## 2019-01-26 DIAGNOSIS — E1165 Type 2 diabetes mellitus with hyperglycemia: Secondary | ICD-10-CM | POA: Diagnosis not present

## 2019-01-26 DIAGNOSIS — I10 Essential (primary) hypertension: Secondary | ICD-10-CM | POA: Diagnosis not present

## 2019-03-20 ENCOUNTER — Other Ambulatory Visit: Payer: Self-pay

## 2019-03-20 NOTE — Patient Outreach (Signed)
[facility]) Care Management  03/20/2019  DERVIN VORE [id number] [id number]   Medication Adherence call to Mr. Anton Cheramie HIPPA Compliant Voice message left with a call back number. Mr. Kimes is showing past due on Rosuvastatin 40 mg under [facility].   [facility] Management Direct Dial [phone]  Fax [phone] [email or web address]

## 2019-03-24 DIAGNOSIS — M48062 Spinal stenosis, lumbar region with neurogenic claudication: Secondary | ICD-10-CM | POA: Diagnosis not present

## 2019-03-24 DIAGNOSIS — M5136 Other intervertebral disc degeneration, lumbar region: Secondary | ICD-10-CM | POA: Diagnosis not present

## 2019-03-24 DIAGNOSIS — R030 Elevated blood-pressure reading, without diagnosis of hypertension: Secondary | ICD-10-CM | POA: Diagnosis not present

## 2019-04-23 ENCOUNTER — Other Ambulatory Visit: Payer: Self-pay | Admitting: Internal Medicine

## 2019-04-28 DIAGNOSIS — I2510 Atherosclerotic heart disease of native coronary artery without angina pectoris: Secondary | ICD-10-CM | POA: Diagnosis not present

## 2019-04-28 DIAGNOSIS — I10 Essential (primary) hypertension: Secondary | ICD-10-CM | POA: Diagnosis not present

## 2019-04-28 DIAGNOSIS — E1165 Type 2 diabetes mellitus with hyperglycemia: Secondary | ICD-10-CM | POA: Diagnosis not present

## 2019-04-28 DIAGNOSIS — J449 Chronic obstructive pulmonary disease, unspecified: Secondary | ICD-10-CM | POA: Diagnosis not present

## 2019-04-28 DIAGNOSIS — H5213 Myopia, bilateral: Secondary | ICD-10-CM | POA: Diagnosis not present

## 2019-05-01 DIAGNOSIS — J449 Chronic obstructive pulmonary disease, unspecified: Secondary | ICD-10-CM | POA: Diagnosis not present

## 2019-05-01 DIAGNOSIS — Z23 Encounter for immunization: Secondary | ICD-10-CM | POA: Diagnosis not present

## 2019-05-01 DIAGNOSIS — I2510 Atherosclerotic heart disease of native coronary artery without angina pectoris: Secondary | ICD-10-CM | POA: Diagnosis not present

## 2019-05-01 DIAGNOSIS — M545 Low back pain: Secondary | ICD-10-CM | POA: Diagnosis not present

## 2019-05-01 DIAGNOSIS — E1165 Type 2 diabetes mellitus with hyperglycemia: Secondary | ICD-10-CM | POA: Diagnosis not present

## 2019-05-08 ENCOUNTER — Encounter: Payer: Self-pay | Admitting: *Deleted

## 2019-05-08 DIAGNOSIS — Z006 Encounter for examination for normal comparison and control in clinical research program: Secondary | ICD-10-CM

## 2019-05-08 NOTE — Research (Signed)
COORDINATE-Diabetes 6 Month CASE REPORT FORM (Intervention) -EHR Site #:   [id number]              Patient ID:    [id number]              9 MONTH EHR REVIEW  Medical Record Check Date   05/08/2019  MM DD YYYY  Vital Status _0 Patient Alive >Date last known alive per EHR:05/08/2019                                                   MM DD YYYY  _1 Patient Dead > Complete Death Form  _2 Unknown   CLINICAL EVENTS / PROCEDURES  Hospitalization since last visit? (>=24 hour stay) _3 No _4 Yes  >> if yes, Complete the following  Date of hospital admission:        / /             MM DD YYYY  Primary discharge diagnosis:  *Complete appropriate event validation form _5 acute myocardial infarction (heart attack)* _6 stroke* _7 heart failure* _8 coronary revascularization* _9 peripheral revascularization* _10 cerebral revascularization* _11 diabetes (e.g. hypoglycemia, DKA) _12 renal failure _13 amputation _14 other cardiovascular reason _15 other NON-cardiovascular reason _16 unknown  Other diagnoses not documented above: (check all that apply)  *Complete appropriate event validation form _17 acute myocardial infarction (heart attack)* _18 stroke* _19 heart failure* _20 coronary revascularization* _21 peripheral revascularization* _22 cerebral revascularization* _23 diabetes (e.g. hypoglycemia, DKA) _24 renal failure _25 amputation _26 other cardiovascular reason _27 other NON-cardiovascular reason _28 unknown  Were any of the following outpatient procedures done since the last visit? (I.e. procedures not captured above)  Coronary revascularization _29 No _30 Yes >IF YES, Date / /             MM DD YYYY  Peripheral revascularization _31 No _32 Yes > IF YES, Date / /             MM DD YYYY  Cerebral revascularization _33 No _34 Yes > IF YES, Date / /             MM DD YYYY  Extremity amputation    _35 No _36 Yes >IF YES, Date / /             MM      DD       YYYY  Renal replacement therapy (i.e. dialysis)    _37 No _38 Yes > IF YES, Date of  initiation / /             MM      DD       YYYY   **EDC will allow for collection of multiple hospitalizations and procedures   MEDICATIONS  Medication Currently Prescribed? Losartan 25 mg daily  If started since last visit: If not started since last visit: If stopped since last visit:  Cardiac Medications  ACE Inhibitor / Angiotensin Receptor Blocker (ARB) / Angiotensin Receptor Neprilysin inhibitor (ARNi)  _39 No >  _40 Yes > Since last visit, medication was: _41 Stopped _42 Not started _43 Started _44 Continued same medication _45 Continued with medication changes Date started:        / /             MM DD YYYY Who prescribed? _46 Cardiology provider  _47 Study clinic _48 Outside clinic _49 Endocrinology provider _50 Primary care provider _51 Other provider  Specify:                             _52 Unknown  Reason (check all that apply): _0 History of swelling around lips, eyes or face _1 Feeling dizzy/lightheaded _2 Low blood pressure _3 Poor or fluctuating kidney function _4 High potassium _5 Patient has experienced other side effects to this medication  before _6 Patient will be unable to adhere/monitor _7 Patient unable to afford it _8 Patient does not want to      take this medication _9 Pregnancy _10 Other (specify: ) _11 Unknown Reason Date discontinued:        / /             MM DD YYYY Reason (check all that apply): _12 Swelling around lips, eyes       or face _13 Feeling dizzy/lightheaded _14 Low blood pressure _15 Poor or fluctuating kidney function _16 High potassium _17 Other medication side       effects _18 Patient unable to        adhere/monitor _19 Had an       operation/procedure that        required stopping it _20 Patient unable to afford it _21 Patient no longer wants       to take this medication _22 Pregnancy _23 Other (specify: ) _24 Unknown Reason   If started or changed > Medication Name: _25 Benazepril (Lotensin) _26 Captopril (Capoten) _27 Enalapril (Vasotec) _28 Fosinopril  (Monopril) _29 Lisinopril (Zestril, Prinivil) _30 Quinapril (Accupril) _31 Ramipril (Altace) _32 Azilsartan (Edarbi) _33 Candesartan (Atacand) _34 Irbesartan (Avapro) _35 Losartan (Cozaar) _36 Olmesartan (Benicar) _37 Telmisartan (Micardis) _38 Valsartan (Diovan) _39 Sacrubitril/Valsartan (Entresto)     Beta Blocker _40 No _41  Yes > _42 Acebutolol (Sectral) _43 Bisoprolol (Zebeta) _44 Carvedilol (Coreg) _45 Labetalol (Trandate,      Normodyne) _46 Metoprolol succinate (Toprol) _47 Metoprolol tartrate       (Lopressor) _48 Nadolol (Corgard) _49 Nebivolol (Bystolic) <[id number]>_1<\/[id number]>_46 Propranolol (Inderal) _51 Sotalol (Betapace)     Medication Currently Prescribed? If started since last visit: If not started since last visit: If stopped since last visit:  Aldosterone Antagonist _52 No _53 Yes > _54 Amiloride _55 Eplerenone (Inspra) _56 Spirinolactone (Aldactone) _57 Traimterene (Dyrenium)   Calcium Channel Blocker _58 No _59  Yes > Medication Name: _60 Amlodipine (Norvasc) _61 Diltiazem (Cardizem) _62 Felodipine (Plendil) _63 Nifedipine (Procardia) _64 Verapamil (Calan)   Diuretic Loop _65 No _66  Yes > Medication Name: _67 Bumetanide (Bumex) _68 Ethacrynic acid (Edecrin) _69 Furosemide (Lasix) _70 Torsemide (Demadex)   Diuretic Thiazide- type _71 No _72  Yes > Medication Name: _73 Chlorothiazide      _74 Chlorthalidone _75 Hydrochlorothiazide _76 Indapamide _77 Metolazone   Anticoagulation Therapy (other than Warfarin) _78 No _79  Yes > Medication Name: _80 Apixaban (Eliquis) _81 Edoxaban (Lixiana) _82 Rivaroxaban Alen Blew) _83 Dabigatran (Redaxa)   Warfarin _84 No _85  Yes    Antiplatelet Agent (including aspirin) _86 No _87  Yes > Medication Name (check all that apply): _88 Aspirin _89 Clopidogrel (Plavix) _90 Prasugrel (Effient) _91 Ticagrelor (Brilinta) _92 Ticlopidine (Ticlid) _93 Dipyridamole (Persantine)    Medication Currently Prescribed? Rosuvastatin 40 mg daily If started since last visit: If not started since last visit: If stopped  since last visit:  Statin  _94 No >  _95 Yes > Since last visit, medication was: _96 Stopped _97 Not started _98 Started _99 Continued same medication and dose _100 Continued with dose or medication changes Date started:        / /             MM DD YYYY Who prescribed? _101 Cardiology provider _102 Endocrinology provider _103 Primary care provider _104 Other provider  Specify:                             _105 Unknown Reason (check all that apply): _106 History of Rhabdomyolysis _107 LDL-cholesterol already       <70 _108 Muscle      aches/pain/weakness _109 Mental       fogginess/memory loss _110 Liver dysfunction _111 Patient has  experienced other side   effects to this medication before _112 Patient will be unable to  adhere/monitor _0 Patient unable to afford it _1 Patient does not want to take this medication _2 Pregnancy _3 Other (specify: ) _4 Unknown Reason Date discontinued:        / /             MM DD YYYY Reason (check all that apply): _5 Rhabdomyolysis _6 Muscle aches/pain/weakness _7 Mental fogginess/memory loss _8 Liver dysfunction _9 Other medication side effects _10 Patient unable to          adhere/monitor _11 Patient unable to afford it _12 Patient no longer wants to take       this medication _13 Pregnancy _14 Other (specify: ) _15 Unknown Reason   If started or changed  Medication Name: _16 Atorvastatin (Lipitor) _17 Fluvastatin (Lescol) _18 Lovastatin (Mevacor) _19 Pravastatin (Pravachol) _20 Rosuvastatin (Crestor) _21 Simvastatin (Zocor) _22 Pitatavastatin (Livalo)  Dose: _23 1 mg _24 10 mg _25 2 mg _26  20 mg _27 3 mg _28  40 mg _29 4 mg _30  60 mg _31 5 mg _32  80 mg  Frequency: _33 Daily  _34 Less than daily      Does the patient have statin intolerance that prevents the use of maximum dose of high potency statin? _35 No _36 Yes >IF YES, Complete Statin Intolerance form   Non-statin lipid lowering therapy _37 No _38 Yes > Medication Name (check all that apply): _39 Colesevelam (Welchol) _40 Ezetimibe  (Zetia) _41 Fibrate _42 Niacin _43 PCSK9 inhibitor _44 Omega 3 acid ethyl esters (Lovaza) _45 Icosapent Ethyl (Vascepa) _46 Over the counter omega 3 fatty acid or fish oil supplement     Medication Currently Prescribed? Jardiance 10 mg Daily  If started since last visit: If not started since last visit: If stopped since last visit:  Diabetes Medications  SGLT2 Inhibitor  _47 No >  _48 Yes > Since last visit, medication was: _49 Stopped _50 Not started _51 Started _52 Continued same medication _53 Continued with medication changes Date started:        / /             MM DD YYYY Who prescribed? _54 Cardiology provider  _55 Study clinic _56 Outside clinic _57 Endocrinology      provider _58 Primary care provider _59 Other provider  Specify:                             _60 Unknown Reason (check all that apply): _61 eGFR <45 _62 HbA1c<7% on metformin monotherapy OR already on GLP1RA and do not need to start another anti- hyperglycemic _63 Already dehydrated _64 Low blood pressure _65 High risk of Hypoglycemia _66 Prior DKA _67 Recurrent mycotic genital infections _68 History of or at risk for amputation _69 Patient has experienced other side effects to this medication before _70 Patient will be unable to adhere/monitor _71 Patient unable to afford it _72 Patient does not want to take this medication _73 Pregnancy _74 Other (specify: ) _75 Unknown Reason Date discontinued:        / /             MM DD YYYY Reason (check all that apply  _76 eGFR now <45 _77 Dehydration _78 Low blood pressure _79 Hypoglycemia _80 DKA _81 Mycotic genital infection _82 Amputation _83 Other medication side effects _84 Patient unable    to adhere/monitor  _85 Had an operation/procedure     that required stopping it _86 Patient unable to afford it _87 Patient no longer wants to      take this medication _88 Pregnancy _89 Other (specify: ) _90 Unknown Reason   If started or changed > Medication Name: _91 Canaglifozin (Invokana) _92 Dapagliflozin Wilder Glade) _93 Empaglifozin  (Jardiance) _94 Ertugliflozin Actuary)           Medication Currently Prescribed? If started since last visit: If not started since last visit: If stopped since last visit:  GLP1 Receptor Agonist  _95 No >  _96 Yes > Since last visit, medication was: _97   Stopped _0 Not started _1 Started _2 Continued same medication _3 Continued with medication changes Date started:        / /             MM DD YYYY Who prescribed? _4 Cardiology provider  _5 Study clinic _6 Outside clinic _7 Endocrinology       provider _8 Primary care provider _9 Other provider > Specify:                             _10 Unknown Reason (check all that apply): _11 Personal or family history of medullary thyroid cancer _12 MEN2 _13 HbA1c<7% on metformin monotherapy OR already on SGLT2i and do not need to start another anti-hyperglycemic _14 eGFR now <30 _15 High risk of Hypoglycemia _16 History of pancreatitis _17 Significant gastroparesis _18 Prior gastric surgery _19 Patient has experienced other side effects to this medication before _20 Patient will be unable to adhere/monitor _21 Patient unable to afford it _22 Patient does not want to take this medication _23 Pregnancy _24 Other (specify: ) _25 Unknown Reason Date discontinued:        / /             MM DD YYYY Reason (check all that apply): _26 Medullary thyroid cancer _27 MEN2 _28 eGFR now <30 _29 Hypoglycemia _30 Pancreatitis _31 Significant gastroparesis _32 Gastric surgery _33 Other medication side       effects _34   Patient  unable    to adhere/monitor                                  _35 Had an operation/procedure that required stopping it _36 Patient unable to afford it _37 Patient no longer wants to take this medication _38 Pregnancy _39 Other (specify: ) _40 Unknown Reason   If started or changed > Medication Name: _41 Albiglutide (Tanzeum) _42 Dulaglutide (Trulicity) <[id number]>_1<\/YNWGNFAOZHYQMVHQ>_46 Exanatide (Byetta, Bydureon) _44 Liraglutide (Victoza, Saxenda) _45 Lixisenatide (Adlyxin) _46 Semaglutice (Ozempic)       Medication Currently Prescribed? If started since last visit: If not started since last visit: If stopped since last visit:  Other non Insulin diabetes medications _47 No _48 Yes > Medication Name (check all that apply): _49 Acarbose (Precose) _50 Miglitol (Glyset) _51 Glimepiride (Amaryl) _52 Glipizide (Amaryl) _53 Glyburide (Diabeta,       Glynase,   Micronase) _54 Metformin (Fortamet,        Glucophage[including XR],        Glumetza, Riomet) _55 Pioglitazone (Actos) _56 Nateglinide (Starlix) _57 Pramlintide (Symilin) _58 Repaglinide (Prandin) _59 Rosiglitazone (Avandia) _60 Alogliptin (Nesina) _61 Linagliptin (Tradjenta) _62 Saxagliptin (Onglyza) _63 Sitagliptin (Januvia) _64 Bromocriptine Quick Release (Cycloset)     Insulin _65 No _66  Yes > total daily dose: units     STATIN INTOLERANCE (PER EHR/OTHER SOURCE DATA)  1. Was CK checked? _67 No _68 Yes   >If yes, select from the following: _69 CK not elevated _70 CK elevated 1-5x upper limit of normal _71 CK elevated >5x upper limit of normal  2. Does the patient have muscle symptoms? _72 No _73 Yes    >If yes, select from the following: Location and pattern of muscle symptoms (select all that apply) _74 Symmetric, hip flexors or thighs _75 Symmetric, calves _76 Symmetric, proximal upper extremity _77 Asymmetric, intermittent, or not specific to any area _78 Unknown   Timing of muscle symptom in relation to starting statin regimen _79 <4 weeks _80 4-12 weeks _81 >12 weeks _82 Unknown   Timing of muscle symptoms improvement after withdrawal of statin _83 <2 weeks _84 2-4 weeks _85 No improvement after 4 weeks _86 Unknown  3. Was patient re-challenged with a statin regimen (even if same statin compound or regimen as above)?  _87 No  _88 Yes  _89 Unknown  >If yes, select from the following: Timing of recurrence of similar muscle symptoms  in relation to starting second regimen _0 <4 weeks _1 4-12 weeks _2 >12 weeks _3 Similar symptoms did not recur _4 Unknown    [email or web address]    Spoke with patient vias phone for Coordinate 6 month follow up. Patient doing well. No hospitalization  Since last 3 month follow up. DM education given to patient. Next follow up scheduled for December.

## 2019-05-18 DIAGNOSIS — H524 Presbyopia: Secondary | ICD-10-CM | POA: Diagnosis not present

## 2019-05-18 DIAGNOSIS — H52223 Regular astigmatism, bilateral: Secondary | ICD-10-CM | POA: Diagnosis not present

## 2019-05-20 IMAGING — DX DG LUMBAR SPINE 2-3V
3 series · 3 of 3 positions shown · non-contrast
Comparison: Lumbar spine MRI 09/05/2017, 03/15/2015 and earlier.

CLINICAL DATA: 56-year-old with spinal stenosis who has undergone 3
prior lumbar surgeries and is scheduled for yet another surgery on
[DATE]. Evaluate lumbar stability.

EXAM:
LUMBAR SPINE - 2-3 VIEW

[l-spine ap]
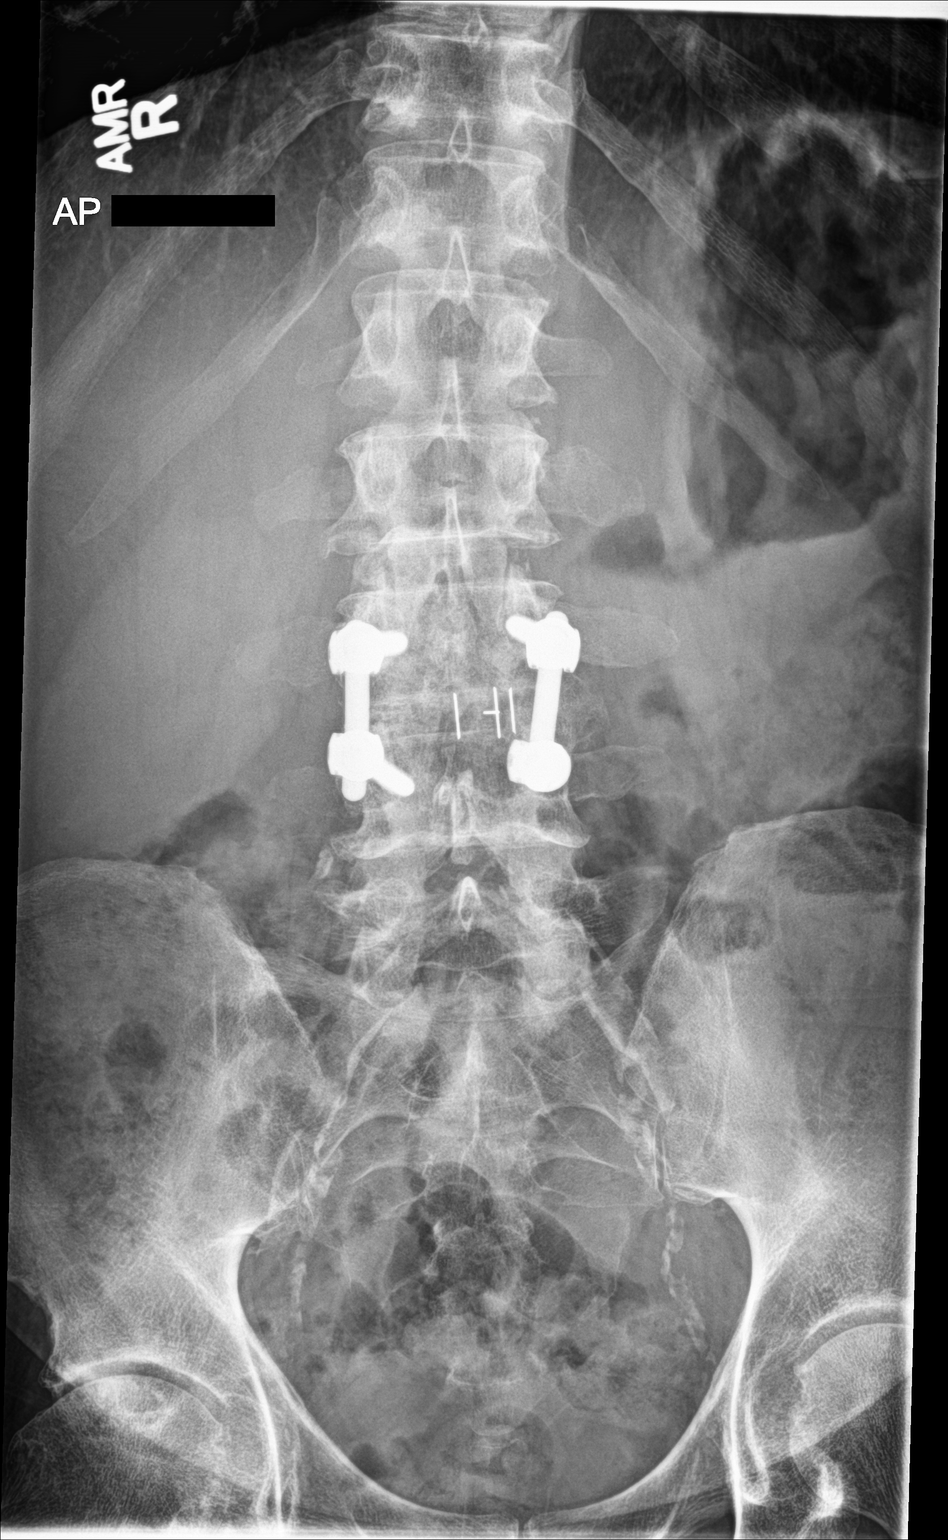

[l-spine lat (1 of 2)]
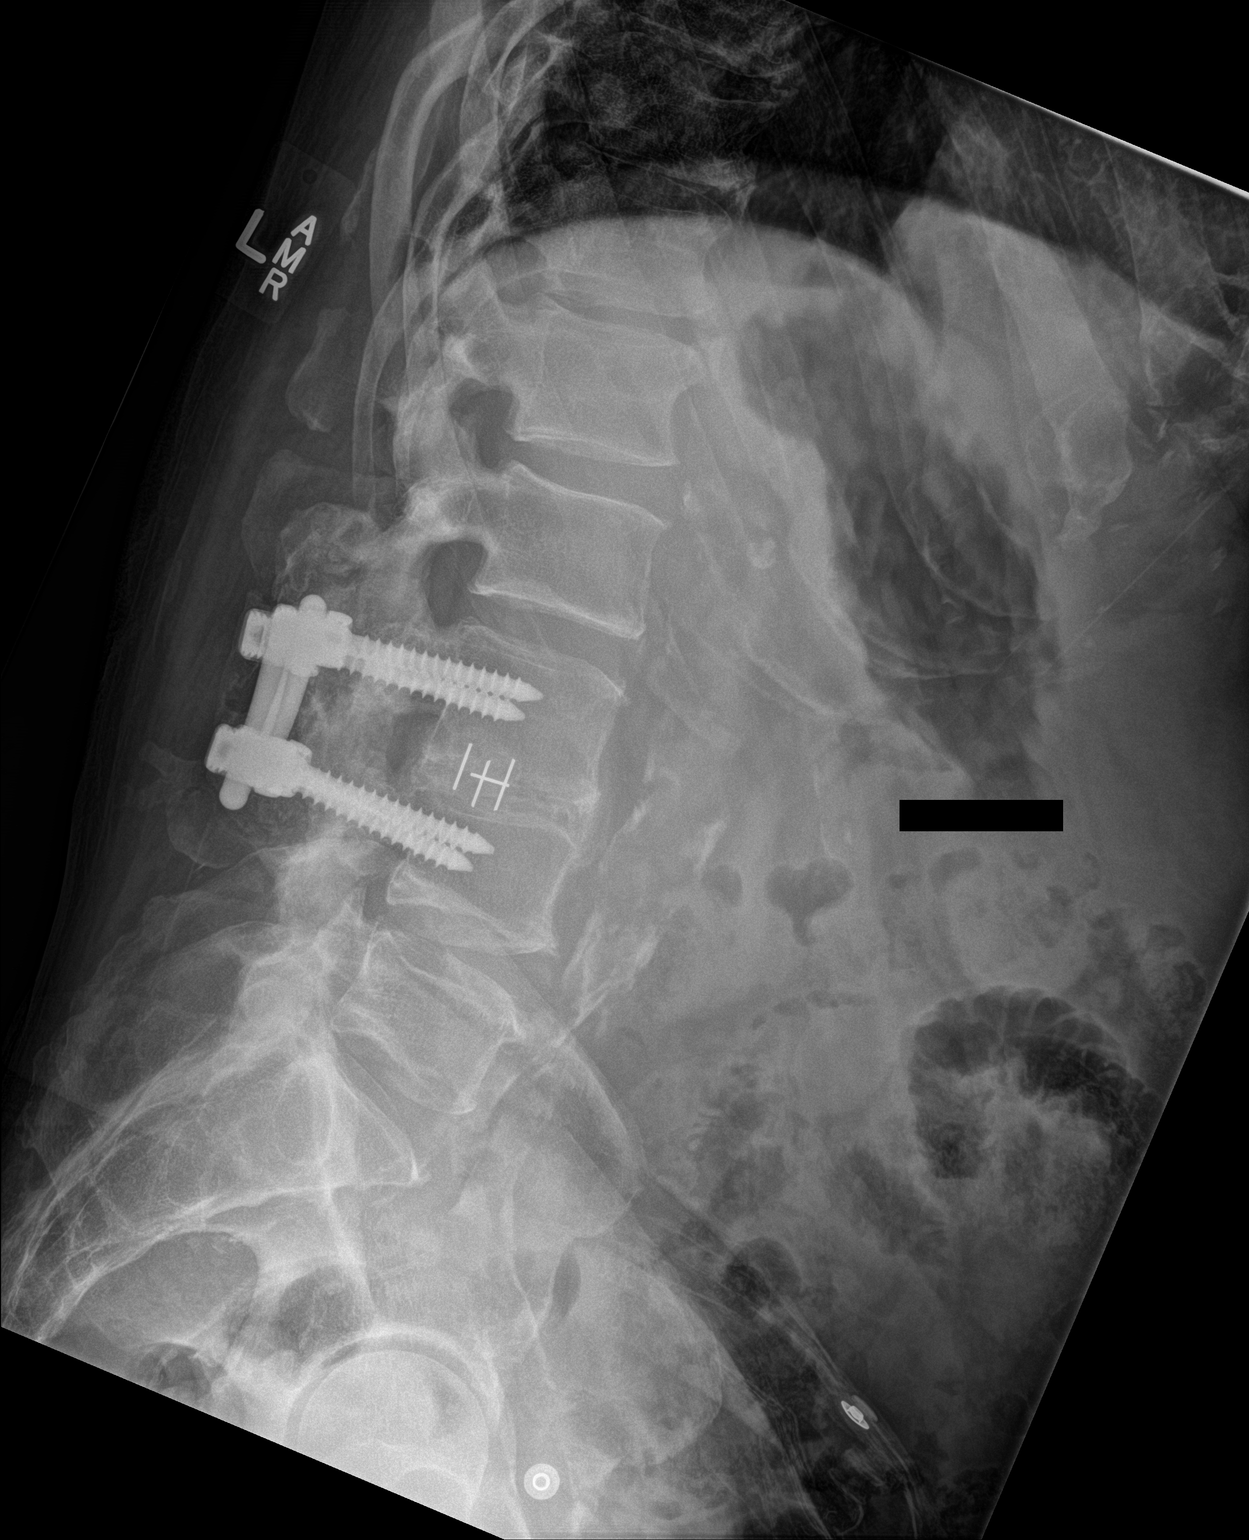

[l-spine lat (2 of 2)]
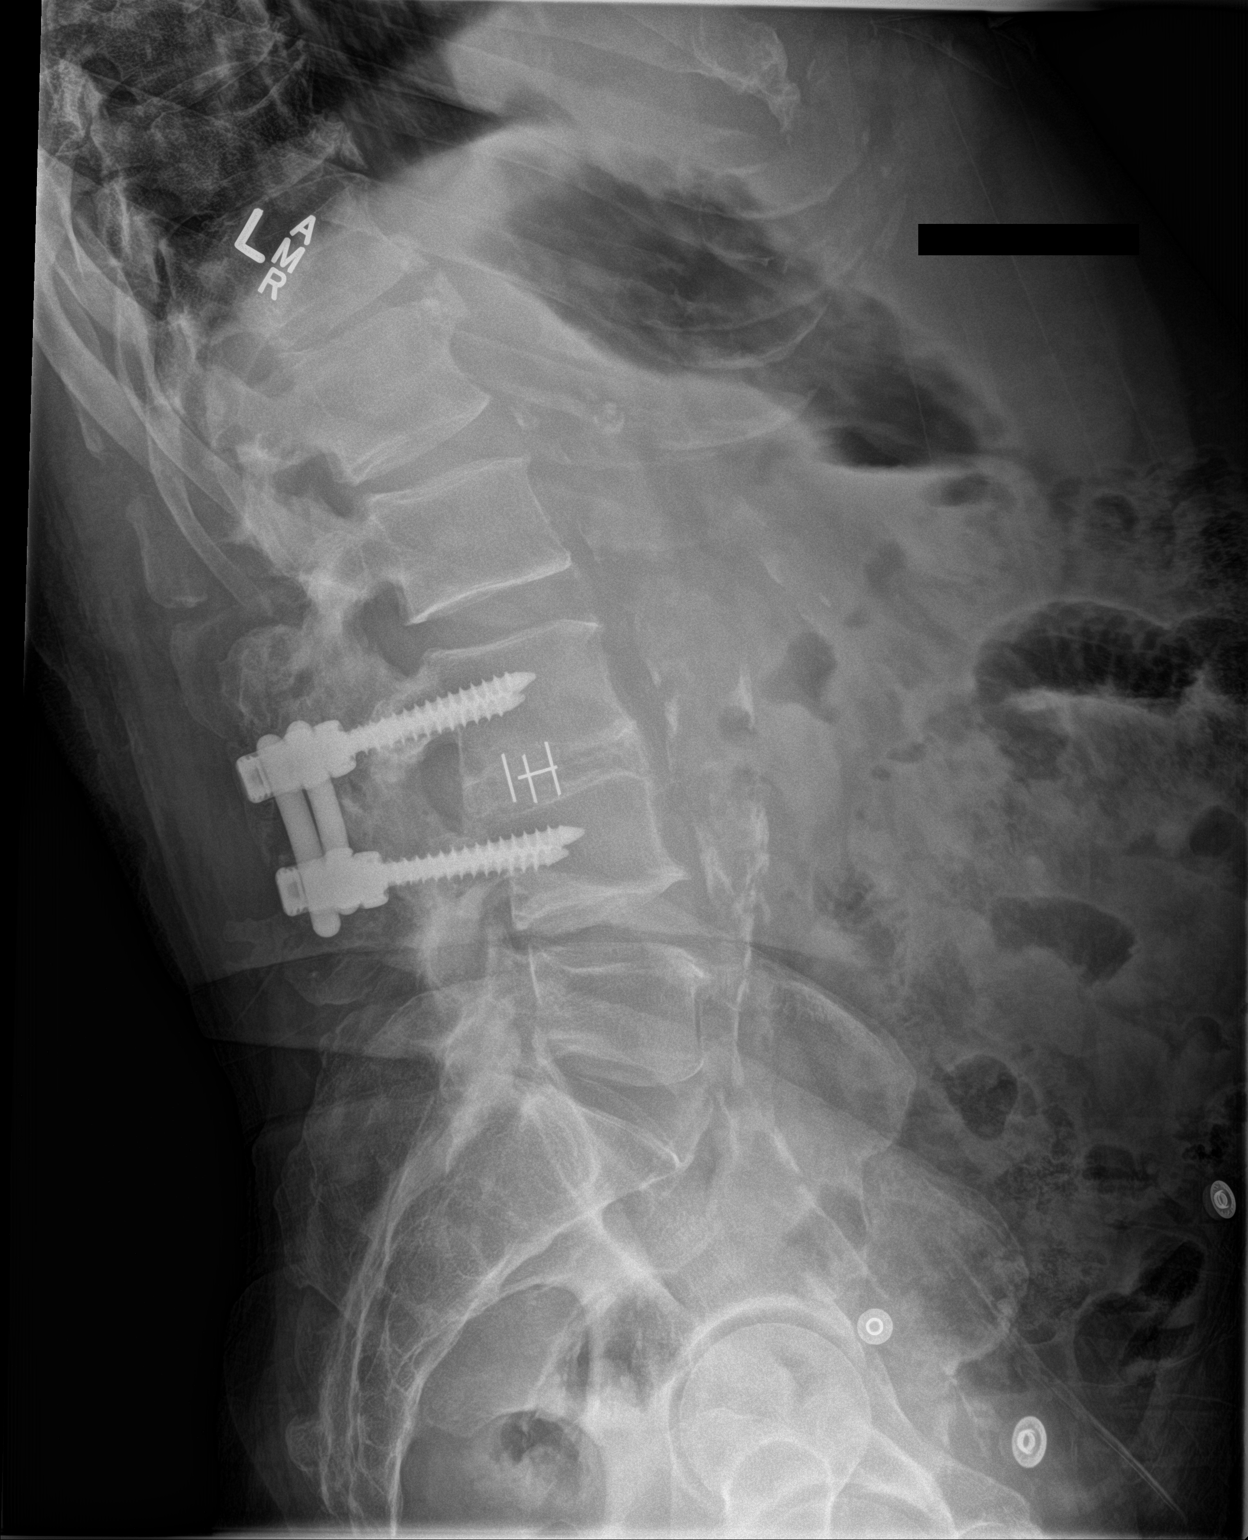

[3 of 3 positions shown; findings below may reference images not displayed]

FINDINGS: Standing AP image and lateral images in flexion and extension were
obtained. Five non-rib-bearing lumbar vertebrae with anatomic
posterior alignment. Prior L3-4 PLIF and interbody fusion with
plugs. Hardware intact. Fusion plugs appropriately positioned in the
disc space. Mild disc space narrowing at L2-3. Remaining disc spaces
well-preserved.

Limited range of motion but no evidence of instability in flexion
and extension.

Aortoiliac atherosclerosis without evidence of aneurysm.
IMPRESSION: 1. Limited range of motion but no evidence of instability in flexion
and extension.
2. Solid appearing fusion at L3-4.
3. Mild degenerative disc disease at L2-3.
4.  Aortic Atherosclerosis ([id number]-170.0)

## 2019-06-29 ENCOUNTER — Other Ambulatory Visit: Payer: Self-pay

## 2019-06-29 NOTE — Patient Outreach (Signed)
[facility]) Care Management  06/29/2019  HAMZE AUBREY [birth year] [id number]   Medication Adherence call to Mr. Sirius Lumpp [facility] Identifiers Verify spoke with patient he is past due on Rosuvastatin 40 mg ,patient explain he takes 1 tablet daily,patient has a few left and will order in a few days. Mr. Luongo is showing past due under [place].    [place] Management Direct Dial [phone]  Fax [phone] [email or web address]'

## 2019-07-14 DIAGNOSIS — M545 Low back pain: Secondary | ICD-10-CM | POA: Diagnosis not present

## 2019-07-14 DIAGNOSIS — K219 Gastro-esophageal reflux disease without esophagitis: Secondary | ICD-10-CM | POA: Diagnosis not present

## 2019-07-14 DIAGNOSIS — E1165 Type 2 diabetes mellitus with hyperglycemia: Secondary | ICD-10-CM | POA: Diagnosis not present

## 2019-07-14 DIAGNOSIS — M2550 Pain in unspecified joint: Secondary | ICD-10-CM | POA: Diagnosis not present

## 2019-07-27 ENCOUNTER — Other Ambulatory Visit: Payer: Self-pay | Admitting: Internal Medicine

## 2019-09-03 DIAGNOSIS — D510 Vitamin B12 deficiency anemia due to intrinsic factor deficiency: Secondary | ICD-10-CM | POA: Diagnosis not present

## 2019-09-03 DIAGNOSIS — E7849 Other hyperlipidemia: Secondary | ICD-10-CM | POA: Diagnosis not present

## 2019-09-03 DIAGNOSIS — E559 Vitamin D deficiency, unspecified: Secondary | ICD-10-CM | POA: Diagnosis not present

## 2019-09-03 DIAGNOSIS — E1165 Type 2 diabetes mellitus with hyperglycemia: Secondary | ICD-10-CM | POA: Diagnosis not present

## 2019-09-03 DIAGNOSIS — I110 Hypertensive heart disease with heart failure: Secondary | ICD-10-CM | POA: Diagnosis not present

## 2019-09-08 ENCOUNTER — Telehealth: Payer: Self-pay | Admitting: Internal Medicine

## 2019-09-08 DIAGNOSIS — I110 Hypertensive heart disease with heart failure: Secondary | ICD-10-CM | POA: Diagnosis not present

## 2019-09-08 DIAGNOSIS — E1165 Type 2 diabetes mellitus with hyperglycemia: Secondary | ICD-10-CM | POA: Diagnosis not present

## 2019-09-08 DIAGNOSIS — R609 Edema, unspecified: Secondary | ICD-10-CM

## 2019-09-08 DIAGNOSIS — M542 Cervicalgia: Secondary | ICD-10-CM | POA: Diagnosis not present

## 2019-09-08 DIAGNOSIS — E7849 Other hyperlipidemia: Secondary | ICD-10-CM | POA: Diagnosis not present

## 2019-09-08 NOTE — Telephone Encounter (Signed)
Spoke pt who report left lower extremity edema x 2 weeks. He denies redness or tender to touch. Pt report he was seen by pcp today who instructed to f/u with Dr. Debara Pickett. Nurse consulted with Dr. Margaretann Loveless DOD who recommend lower extremity venous doppler to rule out DVT and f/u appointment with APP. Pt made aware. [place] scheduled for 9 am tomorrow 2/3 and f/u at 11:15 am with Almyra Deforest, PA.

## 2019-09-08 NOTE — Telephone Encounter (Signed)
Pt c/o swelling: STAT is pt has developed SOB within 24 hours  1) How much weight have you gained and in what time span? Has not gained weight  2) If swelling, where is the swelling located? From knee down to the tips of his toes.   3) Are you currently taking a fluid pill? no  4) Are you currently SOB? no  5) Do you have a log of your daily weights (if so, list)? no  6) Have you gained 3 pounds in a day or 5 pounds in a week? no  7) Have you traveled recently? No   Patient states he was seen by his PCP about his swelling who told him to see Dr. Debara Pickett. He says he has been having the swelling in his legs for 2 weeks and his PCP told him it may be a blood clot. Patient has an appointment with Dr. Debara Pickett 3/24 and states he had blood work done at his PCP. He would like a call back on whether he should be seen sooner and for advise.

## 2019-09-09 ENCOUNTER — Encounter: Payer: Self-pay | Admitting: Physician Assistant

## 2019-09-09 ENCOUNTER — Encounter: Payer: Self-pay | Admitting: *Deleted

## 2019-09-09 ENCOUNTER — Other Ambulatory Visit: Payer: Self-pay

## 2019-09-09 ENCOUNTER — Ambulatory Visit (HOSPITAL_COMMUNITY)
Admission: RE | Admit: 2019-09-09 | Discharge: 2019-09-09 | Disposition: A | Discharge status: Home / Self Care | Payer: Medicare Other | Source: Ambulatory Visit | Attending: Cardiology | Admitting: Cardiology

## 2019-09-09 ENCOUNTER — Ambulatory Visit (INDEPENDENT_AMBULATORY_CARE_PROVIDER_SITE_OTHER): Payer: Medicare Other | Admitting: Physician Assistant

## 2019-09-09 VITALS — BP 140/75 | HR 68 | Temp 97.7°F | Ht 71.0 in | Wt 186.2 lb

## 2019-09-09 DIAGNOSIS — I739 Peripheral vascular disease, unspecified: Secondary | ICD-10-CM

## 2019-09-09 DIAGNOSIS — I2510 Atherosclerotic heart disease of native coronary artery without angina pectoris: Secondary | ICD-10-CM | POA: Diagnosis not present

## 2019-09-09 DIAGNOSIS — R600 Localized edema: Secondary | ICD-10-CM | POA: Diagnosis not present

## 2019-09-09 DIAGNOSIS — E782 Mixed hyperlipidemia: Secondary | ICD-10-CM

## 2019-09-09 DIAGNOSIS — Z006 Encounter for examination for normal comparison and control in clinical research program: Secondary | ICD-10-CM

## 2019-09-09 DIAGNOSIS — I10 Essential (primary) hypertension: Secondary | ICD-10-CM | POA: Diagnosis not present

## 2019-09-09 DIAGNOSIS — Z951 Presence of aortocoronary bypass graft: Secondary | ICD-10-CM | POA: Diagnosis not present

## 2019-09-09 DIAGNOSIS — R609 Edema, unspecified: Secondary | ICD-10-CM | POA: Diagnosis not present

## 2019-09-09 NOTE — Progress Notes (Signed)
Cardiology Office Note:    Date:  09/11/2019   ID:  [name], DOB 01-May-1961, MRN [id number]  PCP:  Lucia Gaskins, MD  Cardiologist:  [name], MD  Electrophysiologist:  None   Referring MD: Sinda Du, MD   Chief Complaint  Patient presents with  . Follow-up    seen for Dr. Debara Pickett.    History of Present Illness:    [name] is a 59 y.o. male with a hx of CAD s/p CABG x4 (LIMA-LAD, SVG-diagonal, SVG-ramus intermedius, and SVG-RCA) in January 2014, COPD, hypertension, hyperlipidemia, PAD, tobacco abuse and DM2.  Echocardiogram in 2014 showed EF 50 to 55%.  He also had claudication symptoms and eventually underwent percutaneous revascularization using diamondback orbital rotational arthrectomy, chocolate balloon and stent successfully placed in mid right SFA.  Patient was seen in February 2020 with chest pain.  Myoview obtained on 09/10/2018 showed EF 70%, low risks study, very small, mild intensity, apical inferior and anterior defect that are partially reversible and most consistent with soft tissue attenuation rather than ischemia.  Echocardiogram obtained on 09/10/2018 showed EF 60 to 65%, no significant valve issue.  Last lipid panel obtained on 09/10/2018 showed total cholesterol 196, HDL 40, LDL 87, triglyceride 343.  Last ABI obtained on 12/09/2018 was unchanged showing a right ABI of 0.82, left ABI of 0.86.  More recently, he called cardiology service complaining of intermittent swelling in the left lower extremity for 2 weeks.  Venous Doppler obtained at this morning showed no evidence of DVT.  Patient presents today for evaluation of intermittent left lower extremity edema.  He says he does have left knee pain however did not have any surgery on the knee before.  On physical exam, I do not see any erythema, nor do I feel any warmth in the area.  I suspect this is more intermittent venous insufficiency issue.  I recommended salt restriction, leg elevation and compression  stocking.  I do not see any symptoms to suggest cellulitis.  He says he just established with a new primary care provider Dr. Cindie Laroche.  He had fasting lipid panel at his PCPs office 3 days ago, we will request the record.  He is due for ABI in May of this year.  He can follow-up with Dr. Debara Pickett after that.   Past Medical History:  Diagnosis Date  . Arthritis    "some; in hips sometimes" (01/02/2013)  . Back pain    "w/prolonged walking or standing" (01/02/2013)  . CAD (coronary artery disease)    CABG X 4 08/2012  . COPD (chronic obstructive pulmonary disease) ([place])   . Esophageal erosions 05/24/01   egd by Dr. Gala Romney  . GERD (gastroesophageal reflux disease)   . H/O hiatal hernia   . History of gout   . Hyperlipidemia   . Hypertension    not on any medication for htn at present  . PAD (peripheral artery disease) ([place])    LEA DOPPLER, 11/11/2012 - moderate arterial insufficiency to lower extremities at rest, BILATERAL SFA-demonstrates occlusive disease with reconstitution of flow  . Tobacco abuse   . Tubular adenoma of colon 08/28/11  . Type II diabetes mellitus ([place])   . Unintentional weight loss     Past Surgical History:  Procedure Laterality Date  . ANTERIOR CERVICAL DECOMP/DISCECTOMY FUSION  12/19/2011   Procedure: ANTERIOR CERVICAL DECOMPRESSION/DISCECTOMY FUSION 2 LEVELS;  Surgeon: Floyce Stakes, MD;  Location: [facility];  Service: Neurosurgery;  Laterality: N/A;  Cervical  four-five Cervical five-six  Anterior cervical decompression/diskectomy, fusion, plate  . ATHERECTOMY N/A 01/02/2013   Procedure: ATHERECTOMY;  Surgeon: Lorretta Harp, MD;  Location: [facility] CATH LAB;  Service: Cardiovascular;  Laterality: N/A;  . BACK SURGERY     x3  . CARDIAC CATHETERIZATION  08/19/2012   CABG warranted; Severe ostial LCx stenosis with mid to distal vessel occlusion  . COLONOSCOPY W/ POLYPECTOMY  08/28/11   Rourk-tubular adenomas removed from ascending colon, suboptimal prep, diminutive  rectal polyps  . COLONOSCOPY WITH PROPOFOL N/A 06/13/2017   Procedure: COLONOSCOPY WITH PROPOFOL;  Surgeon: Daneil Dolin, MD;  Location[facility];  Service: Endoscopy;  Laterality: N/A;  9:15am  . CORONARY ARTERY BYPASS GRAFT  08/22/2012   Procedure: CORONARY ARTERY BYPASS GRAFTING (CABG);  Surgeon: Grace Isaac, MD;  Location: [place];  Service: Open Heart Surgery;  Laterality: N/A;  CABG x four,  using left internal mammary artery and right leg greater saphenous vein harvested endoscopically  . ESOPHAGOGASTRODUODENOSCOPY  05/24/01   by Dr. Gala Romney  . ESOPHAGOGASTRODUODENOSCOPY  08/28/11   Rourk-erosive reflux esophagitis, gastric erosions  . ESOPHAGOGASTRODUODENOSCOPY (EGD) WITH PROPOFOL N/A 06/13/2017   Procedure: ESOPHAGOGASTRODUODENOSCOPY (EGD) WITH PROPOFOL;  Surgeon: Daneil Dolin, MD;  [facility];  Service: Endoscopy;  Laterality: N/A;  . INTRAOPERATIVE TRANSESOPHAGEAL ECHOCARDIOGRAM  08/22/2012   Procedure: INTRAOPERATIVE TRANSESOPHAGEAL ECHOCARDIOGRAM;  Surgeon: Grace Isaac, MD;  Location: [place];  Service: Open Heart Surgery;  Laterality: N/A;  . LEFT HEART CATHETERIZATION WITH CORONARY ANGIOGRAM N/A 08/19/2012   Procedure: LEFT HEART CATHETERIZATION WITH CORONARY ANGIOGRAM;  Surgeon: [name], MD;  Location: [facility] CATH LAB;  Service: Cardiovascular;  Laterality: N/A;  . LOWER EXTREMITY ANGIOGRAM N/A 11/17/2012   Procedure: LOWER EXTREMITY ANGIOGRAM;  Surgeon: Lorretta Harp, MD;  Location: [facility] CATH LAB;  Service: Cardiovascular;  Laterality: N/A;  . LOWER EXTREMITY ANGIOGRAM N/A 02/02/2013   Procedure: LOWER EXTREMITY ANGIOGRAM;  Surgeon: Lorretta Harp, MD;  Location: [facility] CATH LAB;  Service: Cardiovascular;  Laterality: N/A;  . LUMBAR [place]    . LUMBAR FUSION  ~ 2011  . PELVIC ABCESS DRAINAGE     x2  . POLYPECTOMY  06/13/2017   Procedure: POLYPECTOMY;  Surgeon: Daneil Dolin, MD;  [facility];  Service: Endoscopy;;  colon  .  SHOULDER SURGERY Right   . VASCULAR SURGERY Right 01/02/2013   diamondback orbital rotational  atherectomy, chocolate balloon and IDEV  Stent.    Current Medications: Current Meds  Medication Sig  . aspirin EC 81 MG EC tablet Take 1 tablet (81 mg total) by mouth daily.  . clopidogrel (PLAVIX) 75 MG tablet Take 1 tablet (75 mg total) by mouth daily with breakfast.  . cyclobenzaprine (FLEXERIL) 10 MG tablet Take 1 tablet (10 mg total) by mouth 3 (three) times daily as needed for muscle spasms.  Marland Kitchen docusate sodium (COLACE) 100 MG capsule Take 1 capsule (100 mg total) by mouth 2 (two) times daily.  . empagliflozin (JARDIANCE) 10 MG TABS tablet Take 10 mg by mouth daily.  Marland Kitchen gabapentin (NEURONTIN) 300 MG capsule Take 1 capsule by mouth daily.  Marland Kitchen HUMALOG MIX 75/25 KWIKPEN (75-25) 100 UNIT/ML Kwikpen Inject 30-60 Units into the skin 2 (two) times daily. Patient takes 30 units in the morning and 60 units at night  . isosorbide mononitrate (IMDUR) 30 MG 24 hr tablet Take 1 tablet (30 mg total) by mouth daily.  Marland Kitchen LANTUS SOLOSTAR 100 UNIT/ML Solostar Pen   .  metoprolol succinate (TOPROL-XL) 25 MG 24 hr tablet TAKE 1 TABLET BY MOUTH ONCE A DAY.  Marland Kitchen oxyCODONE-acetaminophen (PERCOCET/ROXICET) 5-325 MG tablet Take 1 tablet by mouth every 6 (six) hours as needed.  Marland Kitchen PROAIR HFA 108 (90 BASE) MCG/ACT inhaler Inhale 2 puffs into the lungs every 6 (six) hours as needed for wheezing or shortness of breath.   . rosuvastatin (CRESTOR) 40 MG tablet Take 1 tablet (40 mg total) by mouth daily.  Marland Kitchen VASCEPA 1 g capsule TAKE (2) CAPSULES BY MOUTH TWICE DAILY.  Marland Kitchen zolpidem (AMBIEN CR) 12.5 MG CR tablet Take 12.5 mg by mouth at bedtime as needed for sleep.      Allergies:   Patient has no known allergies.   Social History   Socioeconomic History  . Marital status: Single    Spouse name: Not on file  . Number of children: 1  . Years of education: Not on file  . Highest education level: Not on file  Occupational History   . Occupation: disabled    Employer: DISABLED  Tobacco Use  . Smoking status: Current Every Day Smoker    Packs/day: 1.00    Years: 39.00    Pack years: 39.00    Types: Cigarettes  . Smokeless tobacco: Never Used  Substance and Sexual Activity  . Alcohol use: Yes    Alcohol/week: 6.0 standard drinks    Types: 6 Cans of beer per week  . Drug use: No  . Sexual activity: Not Currently  Other Topics Concern  . Not on file  Social History Narrative   Lives w/ girlfriend   Social Determinants of Health   Financial Resource Strain:   . Difficulty of Paying Living Expenses: Not on file  Food Insecurity:   . Worried About Charity fundraiser in the Last Year: Not on file  . Ran Out of Food in the Last Year: Not on file  Transportation Needs:   . Lack of Transportation (Medical): Not on file  . Lack of Transportation (Non-Medical): Not on file  Physical Activity:   . Days of Exercise per Week: Not on file  . Minutes of Exercise per Session: Not on file  Stress:   . Feeling of Stress : Not on file  Social Connections:   . Frequency of Communication with Friends and Family: Not on file  . Frequency of Social Gatherings with Friends and Family: Not on file  . Attends Religious Services: Not on file  . Active Member of Clubs or Organizations: Not on file  . Attends Archivist Meetings: Not on file  . Marital Status: Not on file     Family History: The patient's family history is negative for Anesthesia problems, Colon cancer, Gastric cancer, and Esophageal cancer.  ROS:   Please see the history of present illness.     All other systems reviewed and are negative.  EKGs/Labs/Other Studies Reviewed:    The following studies were reviewed today:   EKG:  EKG is ordered today.  The ekg ordered today demonstrates normal sinus rhythm with poor R wave progression in the anterior leads  Recent Labs: No results found for requested labs within last 8760 hours.  Recent  Lipid Panel    Component Value Date/Time   CHOL 196 09/10/2018 1356   CHOL 160 12/07/2013 1436   TRIG 343 (H) 09/10/2018 1356   TRIG 182 (H) 12/07/2013 1436   HDL 40 (L) 09/10/2018 1356   HDL 40 12/07/2013 1436   CHOLHDL  4.9 09/10/2018 1356   VLDL 69 (H) 09/10/2018 1356   LDLCALC 87 09/10/2018 1356   LDLCALC 84 12/07/2013 1436    Physical Exam:    VS:  BP 140/75   Pulse 68   Temp 97.7 F (36.5 C)   Ht 5\' 11"  (1.803 m)   Wt 186 lb 3.2 oz (84.5 kg)   SpO2 97%   BMI 25.97 kg/m     Wt Readings from Last 3 Encounters:  09/09/19 186 lb 3.2 oz (84.5 kg)  10/06/18 183 lb 3.2 oz (83.1 kg)  09/10/18 172 lb (78 kg)     GEN:  Well nourished, well developed in no acute distress HEENT: Normal NECK: No JVD; No carotid bruits LYMPHATICS: No lymphadenopathy CARDIAC: RRR, no murmurs, rubs, gallops RESPIRATORY:  Clear to auscultation without rales, wheezing or rhonchi  ABDOMEN: Soft, non-tender, non-distended MUSCULOSKELETAL:  No edema; No deformity  SKIN: Warm and dry NEUROLOGIC:  Alert and oriented x 3 PSYCHIATRIC:  Normal affect   ASSESSMENT:    1. Leg edema   2. PAD (peripheral artery disease), Viance crossing of mid rt SFA, with diamond back athrectomy and debulking, chocolate balloon and stent with IDEV.  Unsucessful revascularization Lt SFA    3. Essential hypertension   4. Atherosclerosis of native coronary artery of native heart without angina pectoris   5. S/P CABG x 4   6. Mixed hyperlipidemia    PLAN:    In order of problems listed above:  1. Intermittent lower extremity edema: Mostly in the left lower extremity according to the patient, although he does not have significant edema on physical exam today.  There is no sign of cellulitis.  I suspect his recent lower extremity edema is related to venous insufficiency.  I recommended salt restriction, compression stocking and leg elevation  2. CAD s/p CABG: Continue aspirin and Plavix  3. PAD: Due for repeat ABI in  May 2021  4. Hypertension: Blood pressure stable  5. Hyperlipidemia: On Crestor 40 mg daily  Medication Adjustments/Labs and Tests Ordered: Current medicines are reviewed at length with the patient today.  Concerns regarding medicines are outlined above.  Orders Placed This Encounter  Procedures  . EKG 12-Lead   No orders of the defined types were placed in this encounter.   Patient Instructions  Medication Instructions:  Your physician recommends that you continue on your current medications as directed. Please refer to the Current Medication list given to you today.  *If you need a refill on your cardiac medications before your next appointment, please call your pharmacy*   Testing/Procedures:  Schedule LEA and ABI for May of this year---orders already in  Follow-Up: At [facility], you and your health needs are our priority.  As part of our continuing mission to provide you with exceptional heart care, we have created designated Provider Care Teams.  These Care Teams include your primary Cardiologist (physician) and Advanced Practice Providers (APPs -  Physician Assistants and Nurse Practitioners) who all work together to provide you with the care you need, when you need it.  Your next appointment:   6 month(s)  The format for your next appointment:   In Person  Provider:   You may see [name], MD or one of the following Advanced Practice Providers on your designated Care Team:    Almyra Deforest, PA-C  Fabian Sharp, PA-C or   Roby Lofts, [place]   Other Instructions Isaac Laud has recommended elevating your legs and wearing compression stockings (  15-20 mmHg, Knee high).     Hilbert Corrigan, [place]  09/11/2019 11:48 PM    [facility]

## 2019-09-09 NOTE — Patient Instructions (Signed)
Medication Instructions:  Your physician recommends that you continue on your current medications as directed. Please refer to the Current Medication list given to you today.  *If you need a refill on your cardiac medications before your next appointment, please call your pharmacy*   Testing/Procedures:  Schedule LEA and ABI for May of this year---orders already in  Follow-Up: At [facility], you and your health needs are our priority.  As part of our continuing mission to provide you with exceptional heart care, we have created designated Provider Care Teams.  These Care Teams include your primary Cardiologist (physician) and Advanced Practice Providers (APPs -  Physician Assistants and Nurse Practitioners) who all work together to provide you with the care you need, when you need it.  Your next appointment:   6 month(s)  The format for your next appointment:   In Person  Provider:   You may see [name], MD or one of the following Advanced Practice Providers on your designated Care Team:    Almyra Deforest, PA-C  Fabian Sharp, [place] or   Roby Lofts, [place]   Other Instructions [name] has recommended elevating your legs and wearing compression stockings (15-20 mmHg, Knee high).

## 2019-09-10 ENCOUNTER — Other Ambulatory Visit: Payer: Self-pay | Admitting: Family Medicine

## 2019-09-11 ENCOUNTER — Encounter: Payer: Self-pay | Admitting: Physician Assistant

## 2019-09-11 NOTE — Research (Signed)
Marland KitchenMarland KitchenMarland KitchenMarland KitchenMarland KitchenMarland KitchenMarland Kitchen   COORDINATE-Diabetes 9 Month Patient Questionnaire (Intervention) Site #:   [id number]   Patient ID:          [id number] MONTH QUESTIONNAIRE  Visit Date 09/09/2019   Vital Status [x]   Patient Alive > Proceed to Visit Status []   Patient Dead > Complete Death Form only []   Unknown > Proceed to Visit Status  Visit Status Was the interview completed? []  No >IF NO, Select reason why [] Unable to locate                                    [] No Valid Contacts (patients or alternates) [] Multiple attempts to valid contacts []  Patient no longer cared for at study clinic []  Patient withdrew []  Other, specify:                                           >IF NO, Last date of contact: / /             MM      DD        YYYY    [] Yes >IF YES, Select source of Interview: []   Proxy []   Patient     3 Month Patient Questionnaire (Intervention)  Instructions:   1. Prior to speaking with the patient either over the phone or in person, print off or have available the patient's current list of medications.      IF conducting follow-up visit over the phone - Instruct the patient to gather all their pill bottles. 2. For the purpose of the COORDINATE-Diabetes Study, please document whether the patient is currently taking each of the following medication classes. 3. DO NOT PROMPT DURING FOLLOW-UP VISIT: IF subject mentions reaction to one of the following medications, complete the AE/SAE section and follow instructions in operations manual regarding Adverse event reporting to [employer] ([facility]).  MEDICATIONS  Medication Are you currently taking [insert name of previous med]? If currently taking: If started since last visit: If stopped since last visit:  ACE Inhibitor / Angiotensin Receptor Blocker (ARB) / Angiotensin Receptor Neprilysin inhibitor (ARNi) [] No >  [x] Yes > [] Stopped since last visit [] Never prescribed [] Started since last visit [x] Same medication as last      visit [] Different medication than       last visit [Study personnel to answer]  Documented in EHR? [] No  [x] Yes   If no, Verified by: [] Photo of bottle [] Copy of rx [] Dispensing       pharmacy [] Prescribing provider [] Other (specify):                    Date started:        / /             MM DD YYYY Who prescribed this medication for you? [] Cardiology provider > [] Study clinic [] Outside clinic [] Endocrinology provider [] Primary care provider [] Other provider > Specify:                             [] Unknown Date discontinued:        / /             MM DD YYYY Why did you stop taking this medication? (  check all that apply) [] Allergic reaction [] Medication side effects [] Unable to       adhere/monitor [] Had an      operation/procedure      that required      stopping it [] Unable to afford it [] No longer wants        to take        this medication [] Provider decision [] Pregnancy [] Other (specify: ) [] Unknown Reason   If started or changed  What medication are you taking now? [] Benazepril (Lotensin) [] Captopril (Capoten) [] Enalapril (Vasotec) [] Fosinopril (Monopril) [] Lisinopril (Zestril, Prinivil) [] Quinapril (Accupril) [] Ramipril (Altace) [] Azilsartan (Edarbi) [] Candesartan (Atacand) [] Irbesartan (Avapro) [] Losartan (Cozaar) [] Olmesartan (Benicar) [] Telmisartan (Micardis) [] Valsartan (Diovan) [] Sacrubitril/Valsartan Delene Loll)      Medication Are you currently taking [insert name of previous med]? If currently taking: If started since last visit: If stopped since last visit:  Statin []  No >  [x] Yes > [] Stopped since last visit [] Never prescribed [] Started since last visit [x] Same medication as last        visit [] Different medication or dose     than last visit [Study personnel to answer]  Documented in EHR? [] No [x] Yes    If no, Verified by: [] Photo of bottle [] Copy of rx [] Dispensing pharmacy [] Prescribing  provider [] Other (specify):                    Date started:        / /             MM DD YYYY Who prescribed this medication for you? [] Cardiology provider  [] Study clinic [] Outside clinic [] Endocrinology provider [] Primary care provider [] Other provider  Specify:                             [] Unknown Date discontinued:        / /             MM DD YYYY Why did you stop taking this medication? (check all that apply) [] Allergic reaction [] Medication side effects  [] Muscle aches [] Weakness [] Joint pain [] Cognitive symptoms [] Other (specify):                          [] Unable to        adhere/monitor [] Had an    operation/procedure    that required stopping it [] Unable to afford it [] No longer wants       to take this            medication [] Provider decision [] Pregnancy [] Other (specify: ) [] Unknown Reason   If started or changed  What medication are you taking now? [] Atorvastatin (Lipitor) [] Fluvastatin (Lescol) [] Lovastatin (Mevacor) [] Pravastatin (Pravachol) [] Rosuvastatin (Crestor) [] Simvastatin (Zocor) [] Pitatavastatin (Livalo)  Dose: [] 1 mg  [] 10 mg [] 2 mg  []  20 mg [] 3 mg  []  40 mg [] 4 mg  []  60 mg [] 5 mg  []  80 mg  Frequency: [] Daily []  Less than daily      Medication Are you currently taking [insert name of previous med]? If currently taking: If started since last visit: If stopped since last visit:  SGLT2 Inhibitor [] No   [x] Yes [] Stopped since last visit [] Never prescribed [] Started since last visit [x] Same medication as last      visit [] Different medication than last visit [Study personnel to answer]  Documented in EHR? [] No [x] Yes   If no, Verified by: [] Photo of bottle [] Copy of rx [] Dispensing  pharmacy [] Prescribing       provider [] Other (specify):                    Date started:        / /             MM DD YYYY Who prescribed this medication for you? [] Cardiology provider  [] Study clinic [] Outside  clinic [] Endocrinology provider [] Primary care provider [] Other provider  Specify:                             [] Unknown Date discontinued:        / /             MM DD YYYY Why did you stop taking this medication? (check all that apply) [] Allergic reaction [] Medication side effects [] Unable to       adhere/monitor [] Had an        operation/procedure         that required           stopping it [] Patient unable to       afford it [] Patient no longer       wants to       take this medication [] Provider decision [] Pregnancy [] Other (specify: ) [] Unknown Reason   If started or changed  What medication are you taking now? [] Canaglifozin (Invokana) [] Dapagliflozin (Farxiga) [x] Empaglifozin (Jardiance) [] Ertugliflozin (Steglatro)     GLP1 Receptor Agonist [x] No   [] Yes [] Stopped since last visit [x] Never prescribed [] Started since last visit [] Same medication as last      visit [] Different medication than       last visit [Study personnel to answer]  Documented in EHR? [] No [] Yes   If no, Verified by: [] Photo of bottle [] Copy of rx [] Dispensing       pharmacy [] Prescribing       provider [] Other (specify):                    Date started:        / /             MM DD YYYY Who prescribed this medication for you? [] Cardiology provider  [] Study clinic [] Outside clinic [] Endocrinology provider [] Primary care provider [] Other provider  Specify:                             [] Unknown Date discontinued:        / /             MM DD YYYY Why did you stop taking this medication? (check all that apply) [] Allergic reaction [] Medication side effects [] Unable to      adhere/monitor [] Had an    operation/procedure    that required stopping it [] Patient unable to        afford it [] Patient no longer        wants to take this        medication [] Provider decision [] Pregnancy [] Other (specify: ) [] Unknown Reason   If started or changed  What medication  are you taking now? [] Albiglutide (Tanzeum) [] Dulaglutide (Trulicity) [] Exanatide (Byetta, Bydureon) [] Liraglutide (Victoza, Saxenda) [] Lixisenatide (Adlyxin) [] Semaglutice (Ozempic)     10/14/2018 (EDC Release)

## 2019-09-11 NOTE — Telephone Encounter (Signed)
Routing to Dr Holly Bodily, I tried to refuse, he is not a patient here nor would you prescribe this anyway but it says I dont have the security to refuse Rx

## 2019-09-11 NOTE — Telephone Encounter (Signed)
Not a Patient of Dr Marlowe Kays

## 2019-09-15 DIAGNOSIS — E7849 Other hyperlipidemia: Secondary | ICD-10-CM | POA: Diagnosis not present

## 2019-09-15 DIAGNOSIS — I110 Hypertensive heart disease with heart failure: Secondary | ICD-10-CM | POA: Diagnosis not present

## 2019-10-06 DIAGNOSIS — E1165 Type 2 diabetes mellitus with hyperglycemia: Secondary | ICD-10-CM | POA: Diagnosis not present

## 2019-10-06 DIAGNOSIS — K219 Gastro-esophageal reflux disease without esophagitis: Secondary | ICD-10-CM | POA: Diagnosis not present

## 2019-10-06 DIAGNOSIS — I119 Hypertensive heart disease without heart failure: Secondary | ICD-10-CM | POA: Diagnosis not present

## 2019-10-06 DIAGNOSIS — E7849 Other hyperlipidemia: Secondary | ICD-10-CM | POA: Diagnosis not present

## 2019-10-23 ENCOUNTER — Ambulatory Visit: Payer: Medicare Other | Admitting: Urology

## 2019-10-27 ENCOUNTER — Other Ambulatory Visit: Payer: Self-pay | Admitting: Internal Medicine

## 2019-10-28 ENCOUNTER — Ambulatory Visit: Payer: Medicare Other | Admitting: Internal Medicine

## 2019-11-06 ENCOUNTER — Other Ambulatory Visit: Payer: Self-pay | Admitting: Internal Medicine

## 2019-11-10 ENCOUNTER — Encounter: Payer: Self-pay | Admitting: Internal Medicine

## 2019-11-10 ENCOUNTER — Ambulatory Visit (INDEPENDENT_AMBULATORY_CARE_PROVIDER_SITE_OTHER): Payer: Medicare Other | Admitting: Internal Medicine

## 2019-11-10 ENCOUNTER — Other Ambulatory Visit: Payer: Self-pay

## 2019-11-10 VITALS — BP 132/78 | HR 86 | Temp 97.5°F | Ht 71.0 in | Wt 190.8 lb

## 2019-11-10 DIAGNOSIS — I739 Peripheral vascular disease, unspecified: Secondary | ICD-10-CM

## 2019-11-10 DIAGNOSIS — I10 Essential (primary) hypertension: Secondary | ICD-10-CM | POA: Diagnosis not present

## 2019-11-10 DIAGNOSIS — Z951 Presence of aortocoronary bypass graft: Secondary | ICD-10-CM | POA: Diagnosis not present

## 2019-11-10 DIAGNOSIS — E782 Mixed hyperlipidemia: Secondary | ICD-10-CM

## 2019-11-10 NOTE — Patient Instructions (Signed)
Medication Instructions:  Your physician recommends that you continue on your current medications as directed. Please refer to the Current Medication list given to you today.  *If you need a refill on your cardiac medications before your next appointment, please call your pharmacy*   Follow-Up: At [facility], you and your health needs are our priority.  As part of our continuing mission to provide you with exceptional heart care, we have created designated Provider Care Teams.  These Care Teams include your primary Cardiologist (physician) and Advanced Practice Providers (APPs -  Physician Assistants and Nurse Practitioners) who all work together to provide you with the care you need, when you need it.  We recommend signing up for the patient portal called "MyChart".  Sign up information is provided on this After Visit Summary.  MyChart is used to connect with patients for Virtual Visits (Telemedicine).  Patients are able to view lab/test results, encounter notes, upcoming appointments, etc.  Non-urgent messages can be sent to your provider as well.   To learn more about what you can do with MyChart, go to [email or web address].    Your next appointment:   12 month(s)  The format for your next appointment:   In Person  Provider:   You may see [name], MD or one of the following Advanced Practice Providers on your designated Care Team:    Almyra Deforest, PA-C  Fabian Sharp, PA-C or   Roby Lofts, [place]    Other Instructions  [facility] - Dr. Tula Nakayama or Cherly Beach NP  [phone] [place]. [place],  [place]  [address]  Main: [phone]

## 2019-11-10 NOTE — Progress Notes (Signed)
OFFICE NOTE  Chief Complaint:  No complaints  Primary Care Physician: Lucia Gaskins, MD  HPI:  [name] is a 59 year old mildly overweight single, though lives with his girlfriend, Caucasian male father of one child seen for peripheral vascular disease because of claudication and diminished ABIs. Risk factors include continued tobacco abuse of one pack -2 pks per day, diabetes and hyperlipidemia. He was catheterized by Dr. Nicanor Bake 08/19/2012 demonstrated three-vessel disease. He ultimately underwent coronary artery bypass grafting x4 January 17 for LIMA to his LAD, a vein to the diagonal branch, ramus intermedius branch and to the RCA. He tolerated the procedure well. His EF was 50-55% by 2-D echo. Dopplers revealed ABIs of 0.5 and 0.7. He complaind of left greater than right claudication at one block. He underwent percutaneous revascularization using the Viance CTO catheter, diamondback orbital rotational atherectomy, chocolate balloon and IDEV Stent successfully to the mid Rt SFA. His main complaints are ongoing lower extremity swelling. This is fatigue and problems with his hips. He denies any chest pain or worsening shortness of breath.  He was recently sent to [facility] for additional peripheral arterial procedures to open the left SFA by Dr. [name] as Dr. [name] had great difficulty in crossing the lesion. Ultimately after 6 hours Dr. [name] was able to get across the lesion and improve arterial flow. Repeat Dopplers were performed recently demonstrating an ABI of 1.1 on the right and 1.0 on the left in September 2014.  He is also wondering today whether he can stop his Plavix.  [name] returns today for followup. He is doing quite well. He recent saw Dr. Luan Pulling. He was concerned about his ongoing smoking. He reports his blood sugars are improved however they are still not at goal. He denies any claudication symptoms and had ultrasound in March of this year which shows excellent blood  flow to his legs after stenting.   Saw Mr. Futch back today for annual follow-up. Several weeks ago he had some electrical chest pain which lasted for just a few seconds while sitting on the couch. Otherwise he has no symptoms. Recently he's lost about 3-4 pounds every month for the past several months. He's plagued by chronic back pain and seems to have a good appetite. He's had significant workup including chest CT endoscopy and other tests including extensive lab work without any clear etiology of his weight loss. He is struggling from back pain and is planning to go see his neurosurgeon. He reports weakness and numbness and tingling in his legs.   10/04/2016  [name] returns today for follow-up. Overall he seems to be doing well. He continues to get some pain in his legs which is worse after he rests and is sitting for a period of time. He says it's like a burning sensation that gets better. He denies any claudication symptoms and had recent lower extremity arterial Dopplers which showed patent waveforms and normal blood flow. He was recently reassessed with regards to cholesterol and it remains elevated. He was previously on cholesterol medicine but that was discontinued for unknown reasons. He is now back on Crestor 20 mg daily. He's managed to cut back his smoking to half pack per day but is not yet motivated to quit. He understands the significant cardiovascular complications related to this. He is also on insulin-dependent diabetic and is not at goal A1c, recently he reported being over 8.  10/11/2017  [name] is seen today for annual follow-up.  Incidentally  he recently has been having back problems and was seen by Dr. Arnoldo Morale.  He will likely need repeat back surgery based on his MRI findings.  He had previously had surgery by Dr. Joya Salm, who is retired.  He was referred for preoperative risk assessment.  He has a history of CABG as previously mentioned and he is on Plavix.  In addition he has  peripheral arterial disease.  Currently denies any claudication, chest pain or worsening shortness of breath, however over the winter he is not been very active.  In addition he is still a smoker but has cut back his smoking.  He understands that he is at high risk of poor wound healing is a smoker and but not only benefit from smoking cessation for surgery but as well as a lifetime benefit.  10/06/2018  [name] is seen today for hospital follow-up.  Recently seen in the hospital in February and admitted for chest pain.  He described as shooting chest pain which started the right side of his chest and went across.  He ultimately underwent nuclear stress testing which was negative for ischemia and EF was 70%.  He ruled out for acute MI.  He was subsequently discharged to follow-up with me.  His lab work however showed significant abnormalities, including marked dyslipidemia with a total cholesterol 196, HDL 40, LDL 87 and triglycerides 343.  His hemoglobin A1c is also poorly controlled at 9.5.  He has been complaining also of pain particularly in his left leg which is worse with exertion.  He does have a history of bilateral SFA stenting and PAD and had nonobstructive Dopplers in 2018.  11/11/2019  [name] is seen today in follow-up.  His primary care provider retired.  He is now seeing Dr. Cindie Laroche.  He reports he was not able to get his Ambien for about a month and he was quite upset about that.  Other than that recently he was told that he had an elevated PSA which might be prostate cancer.  This also upset him however he has been referred to see Dr. Jeffie Pollock with urology for further evaluation.  He is due for repeat lower extremity arterial Dopplers in May, but denies any lifestyle limiting claudication.  He also denies any chest pain or worsening shortness of breath.  EKG today shows sinus rhythm.  PMHx:  Past Medical History:  Diagnosis Date  . Arthritis    "some; in hips sometimes" (01/02/2013)  .  Back pain    "w/prolonged walking or standing" (01/02/2013)  . CAD (coronary artery disease)    CABG X 4 08/2012  . COPD (chronic obstructive pulmonary disease) ([place])   . Esophageal erosions 05/24/01   egd by Dr. Gala Romney  . GERD (gastroesophageal reflux disease)   . H/O hiatal hernia   . History of gout   . Hyperlipidemia   . Hypertension    not on any medication for htn at present  . PAD (peripheral artery disease) ([place])    LEA DOPPLER, 11/11/2012 - moderate arterial insufficiency to lower extremities at rest, BILATERAL SFA-demonstrates occlusive disease with reconstitution of flow  . Tobacco abuse   . Tubular adenoma of colon 08/28/11  . Type II diabetes mellitus ([place])   . Unintentional weight loss     Past Surgical History:  Procedure Laterality Date  . ANTERIOR CERVICAL DECOMP/DISCECTOMY FUSION  12/19/2011   Procedure: ANTERIOR CERVICAL DECOMPRESSION/DISCECTOMY FUSION 2 LEVELS;  Surgeon: Floyce Stakes, MD;  Location: [facility];  Service:  Neurosurgery;  Laterality: N/A;  Cervical four-five Cervical five-six  Anterior cervical decompression/diskectomy, fusion, plate  . ATHERECTOMY N/A 01/02/2013   Procedure: ATHERECTOMY;  Surgeon: Lorretta Harp, MD;  Location: [facility] CATH LAB;  Service: Cardiovascular;  Laterality: N/A;  . BACK SURGERY     x3  . CARDIAC CATHETERIZATION  08/19/2012   CABG warranted; Severe ostial LCx stenosis with mid to distal vessel occlusion  . COLONOSCOPY W/ POLYPECTOMY  08/28/11   Rourk-tubular adenomas removed from ascending colon, suboptimal prep, diminutive rectal polyps  . COLONOSCOPY WITH PROPOFOL N/A 06/13/2017   Procedure: COLONOSCOPY WITH PROPOFOL;  Surgeon: Daneil Dolin, MD;  Location: [facility];  Service: Endoscopy;  Laterality: N/A;  9:15am  . CORONARY ARTERY BYPASS GRAFT  08/22/2012   Procedure: CORONARY ARTERY BYPASS GRAFTING (CABG);  Surgeon: Grace Isaac, MD;  Location: [address];  Service: Open Heart Surgery;  Laterality: N/A;  CABG x four,   using left internal mammary artery and right leg greater saphenous vein harvested endoscopically  . ESOPHAGOGASTRODUODENOSCOPY  05/24/01   by Dr. Gala Romney  . ESOPHAGOGASTRODUODENOSCOPY  08/28/11   Rourk-erosive reflux esophagitis, gastric erosions  . ESOPHAGOGASTRODUODENOSCOPY (EGD) WITH PROPOFOL N/A 06/13/2017   Procedure: ESOPHAGOGASTRODUODENOSCOPY (EGD) WITH PROPOFOL;  Surgeon: Daneil Dolin, MD;  [facility];  Service: Endoscopy;  Laterality: N/A;  . INTRAOPERATIVE TRANSESOPHAGEAL ECHOCARDIOGRAM  08/22/2012   Procedure: INTRAOPERATIVE TRANSESOPHAGEAL ECHOCARDIOGRAM;  Surgeon: Grace Isaac, MD;  Location: [place];  Service: Open Heart Surgery;  Laterality: N/A;  . LEFT HEART CATHETERIZATION WITH CORONARY ANGIOGRAM N/A 08/19/2012   Procedure: LEFT HEART CATHETERIZATION WITH CORONARY ANGIOGRAM;  Surgeon: [name], MD;  Location: [facility] CATH LAB;  Service: Cardiovascular;  Laterality: N/A;  . LOWER EXTREMITY ANGIOGRAM N/A 11/17/2012   Procedure: LOWER EXTREMITY ANGIOGRAM;  Surgeon: Lorretta Harp, MD;  [facility] C[facility] LAB;  Service: Cardiovascular;  Laterality: N/A;  . LOWER EXTREMITY ANGIOGRAM N/A 02/02/2013   Procedure: LOWER EXTREMITY ANGIOGRAM;  Surgeon: Lorretta Harp, MD;  Location: [facility] CATH LAB;  Service: Cardiovascular;  Laterality: N/A;  . LUMBAR [place]    . LUMBAR FUSION  ~ 2011  . PELVIC ABCESS DRAINAGE     x2  . POLYPECTOMY  06/13/2017   Procedure: POLYPECTOMY;  Surgeon: Daneil Dolin, MD;  [facility];  Service: Endoscopy;;  colon  . SHOULDER SURGERY Right   . VASCULAR SURGERY Right 01/02/2013   diamondback orbital rotational  atherectomy, chocolate balloon and IDEV  Stent.    FAMHx:  Family History  Problem Relation Age of Onset  . Anesthesia problems Neg Hx   . Colon cancer Neg Hx   . Gastric cancer Neg Hx   . Esophageal cancer Neg Hx     SOCHx:   reports that he has been smoking cigarettes. He has a 39.00 pack-year smoking  history. He has never used smokeless tobacco. He reports current alcohol use of about 6.0 standard drinks of alcohol per week. He reports that he does not use drugs.  ALLERGIES:  No Known Allergies  ROS: Pertinent items noted in HPI and remainder of comprehensive ROS otherwise negative.  HOME MEDS: Current Outpatient Medications  Medication Sig Dispense Refill  . aspirin EC 81 MG EC tablet Take 1 tablet (81 mg total) by mouth daily.    . clopidogrel (PLAVIX) 75 MG tablet Take 1 tablet (75 mg total) by mouth daily with breakfast. 30 tablet 11  . cyclobenzaprine (FLEXERIL) 10 MG tablet Take 1  tablet (10 mg total) by mouth 3 (three) times daily as needed for muscle spasms. 50 tablet 1  . docusate sodium (COLACE) 100 MG capsule Take 1 capsule (100 mg total) by mouth 2 (two) times daily. 60 capsule 0  . empagliflozin (JARDIANCE) 10 MG TABS tablet Take 10 mg by mouth daily. 90 tablet 3  . gabapentin (NEURONTIN) 300 MG capsule Take 1 capsule by mouth daily.    Marland Kitchen HUMALOG MIX 75/25 KWIKPEN (75-25) 100 UNIT/ML Kwikpen Inject 30-60 Units into the skin 2 (two) times daily. Patient takes 30 units in the morning and 60 units at night    . icosapent Ethyl (VASCEPA) 1 g capsule TAKE (2) CAPSULES BY MOUTH TWICE DAILY. 120 capsule 5  . isosorbide mononitrate (IMDUR) 30 MG 24 hr tablet Take 1 tablet (30 mg total) by mouth daily. 30 tablet 1  . LANTUS SOLOSTAR 100 UNIT/ML Solostar Pen     . losartan (COZAAR) 25 MG tablet TAKE ONE TABLET BY MOUTH ONCE DAILY. 90 tablet 0  . metoprolol succinate (TOPROL-XL) 25 MG 24 hr tablet TAKE 1 TABLET BY MOUTH ONCE A DAY. 90 tablet 3  . MYRBETRIQ 25 MG TB24 tablet Take 25 mg by mouth daily.    Marland Kitchen oxyCODONE-acetaminophen (PERCOCET/ROXICET) 5-325 MG tablet Take 1 tablet by mouth every 6 (six) hours as needed.    Marland Kitchen PROAIR HFA 108 (90 BASE) MCG/ACT inhaler Inhale 2 puffs into the lungs every 6 (six) hours as needed for wheezing or shortness of breath.     . rosuvastatin (CRESTOR)  40 MG tablet Take 1 tablet (40 mg total) by mouth daily. 30 tablet 1   No current facility-administered medications for this visit.    LABS/IMAGING: No results found for this or any previous visit (from the past 48 hour(s)). No results found.  VITALS: BP 132/78   Pulse 86   Temp (!) 97.5 F (36.4 C)   Ht 5\' 11"  (1.803 m)   Wt 190 lb 12.8 oz (86.5 kg)   SpO2 92%   BMI 26.61 kg/m   EXAM: General appearance: alert and no distress Neck: no JVD and supple, symmetrical, trachea midline Lungs: clear to auscultation bilaterally Heart: regular rate and rhythm, S1, S2 normal, no murmur, click, rub or gallop Abdomen: soft, non-tender; bowel sounds normal; no masses,  no organomegaly Extremities: edema 1+ bilaterally, bilateral leg muscle atrophy Pulses: 2+ and symmetric Skin: Skin color, texture, turgor normal. No rashes or lesions Neurologic: Grossly normal Psych: Mildy irritable  EKG: Normal sinus rhythm at 86, poor R wave progression anteriorly-personally reviewed  ASSESSMENT: 1. Coronary disease status post four-vessel CABG in 2014 2. 60 pack year tobacco user - cut back to 1/2 ppd 3. Insulin-dependent diabetes 4. Dyslipidemia 5. Significant PAD status post bilateral SFA intervention 6. Lower extremity edema 7. Elevated PSA  PLAN: 1.   [name] seems to be doing well although recently was found to have an elevated PSA and has now been referred to urology.  He denies any lifestyle limiting claudication.  He denies any chest pain and EKG shows sinus rhythm.  He recently started on long-acting insulin for his diabetes with his new primary care provider and does report improvement in his sugars.  His cholesterol has been well controlled.  No changes to his medicines today.  Follow-up with me annually or sooner as necessary.  [name], MD, [facility], [place] Director of the Advanced Lipid Disorders &  Cardiovascular  Risk Reduction  Clinic Diplomate of the American Board of Clinical Lipidology Attending Cardiologist  Direct Dial: [phone]  Fax: [phone]  Website:  [email or web address] 11/10/2019, 3:30 PM

## 2019-11-11 ENCOUNTER — Encounter: Payer: Self-pay | Admitting: Internal Medicine

## 2019-11-26 ENCOUNTER — Encounter: Payer: Self-pay | Admitting: *Deleted

## 2019-11-26 DIAGNOSIS — Z006 Encounter for examination for normal comparison and control in clinical research program: Secondary | ICD-10-CM

## 2019-11-27 NOTE — Research (Signed)
COORDINATE-Diabetes 12 Month CASE REPORT FORM (Intervention) -EHR Site #:   [id number]              Patient ID:           [id number]     30 [facility] EHR REVIEW  Medical Record Check Date 04/22/221  Vital Status '[x]'$ Patient Alive >Date last known alive per EHR:   '[]'$ Patient Dead >> Complete Death Form  '[]'$ Unknown   CLINICAL EVENTS / PROCEDURES  Hospitalization since last visit? (>=24 hour stay) '[x]'$ No '[]'$ Yes  >> if yes, Complete the following  Date of hospital admission:        / /             MM DD YYYY  Primary discharge diagnosis:  *Complete appropriate event validation form '[]'$ acute myocardial infarction (heart attack)* '[]'$ stroke* '[]'$ heart failure* '[]'$ coronary revascularization* '[]'$ peripheral revascularization* '[]'$ cerebral revascularization* '[]'$ diabetes (e.g. hypoglycemia, DKA) '[]'$ renal failure '[]'$ amputation '[]'$ other cardiovascular reason '[]'$ other NON-cardiovascular reason '[]'$ unknown  Other diagnoses not documented above: (check all that apply)  *Complete appropriate event validation form '[]'$ acute myocardial infarction (heart attack)* '[]'$ stroke* '[]'$ heart failure* '[]'$ coronary revascularization* '[]'$ peripheral revascularization* '[]'$ cerebral revascularization* '[]'$ diabetes (e.g. hypoglycemia, DKA) '[]'$ renal failure '[]'$ amputation '[]'$ other cardiovascular reason '[]'$ other NON-cardiovascular reason '[]'$ unknown  Were any of the following outpatient procedures done since the last visit? (I.e. procedures not captured above)  Coronary revascularization '[x]'$ No '[]'$ Yes >IF YES, Date / /             MM DD YYYY  Peripheral revascularization '[x]'$ No '[]'$ Yes > IF YES, Date / /             MM DD YYYY  Cerebral revascularization '[x]'$ No '[]'$ Yes > IF YES, Date / /             MM DD YYYY  Extremity amputation    '[x]'$ No '[]'$ Yes >IF YES, Date / /             MM      DD       YYYY  Renal replacement therapy (i.e. dialysis)    '[x]'$ No '[]'$ Yes > IF YES, Date of initiation / /             MM      DD       YYYY   **EDC will allow for collection  of multiple hospitalizations and procedures   MEDICATIONS  Medication Currently Prescribed? If started since last visit: If not started since last visit: If stopped since last visit:  Cardiac Medications  ACE Inhibitor / Angiotensin Receptor Blocker (ARB) / Angiotensin Receptor Neprilysin inhibitor (ARNi)  '[]'$ No >  '[x]'$ Yes > Since last visit, medication was: '[]'$ Stopped '[]'$ Not started '[]'$ Started '[]'$ Continued same medication '[]'$ Continued with medication changes Date started:        / /             MM DD YYYY Who prescribed? '[]'$ Cardiology provider  '[]'$ Study clinic '[]'$ Outside clinic '[]'$ Endocrinology provider '[]'$ Primary care provider '[]'$ Other provider  Specify:                             '[]'$ Unknown Reason (check all that apply): '[]'$ History of swelling around lips, eyes or face '[]'$ Feeling dizzy/lightheaded '[]'$ Low blood pressure '[]'$ Poor or fluctuating kidney function '[]'$ High potassium '[]'$ Patient has experienced other side effects to this medication  before '[]'$ Patient will be unable to adhere/monitor '[]'$ Patient unable to afford it '[]'$ Patient does not want to      take this medication '[]'$ Pregnancy '[]'$ Other (specify: ) '[]'$ Unknown  Reason Date discontinued:        / /             MM DD YYYY Reason (check all that apply): '[]'$ Swelling around lips, eyes       or face '[]'$ Feeling dizzy/lightheaded '[]'$ Low blood pressure '[]'$ Poor or fluctuating kidney function '[]'$ High potassium '[]'$ Other medication side       effects '[]'$ Patient unable to        adhere/monitor '[]'$ Had an       operation/procedure that        required stopping it '[]'$ Patient unable to afford it '[]'$ Patient no longer wants       to take this medication '[]'$ Pregnancy '[]'$ Other (specify: ) '[]'$ Unknown Reason   If started or changed  Medication Name: '[]'$ Benazepril (Lotensin) '[]'$ Captopril (Capoten) '[]'$ Enalapril (Vasotec) '[]'$ Fosinopril (Monopril) '[]'$ Lisinopril (Zestril, Prinivil) '[]'$ Quinapril (Accupril) '[]'$ Ramipril (Altace) '[]'$ Azilsartan  (Edarbi) '[]'$ Candesartan (Atacand) '[]'$ Irbesartan (Avapro) '[]'$ Losartan (Cozaar) '[]'$ Olmesartan (Benicar) '[]'$ Telmisartan (Micardis) '[]'$ Valsartan (Diovan) '[]'$ Sacrubitril/Valsartan (Entresto)     Beta Blocker '[]'$ No '[]'$  Yes > '[]'$ Acebutolol (Sectral) '[]'$ Bisoprolol (Zebeta) '[]'$ Carvedilol (Coreg) '[]'$ Labetalol (Trandate,      Normodyne) '[]'$ Metoprolol succinate (Toprol) '[]'$ Metoprolol tartrate       (Lopressor) '[]'$ Nadolol (Corgard) '[]'$ Nebivolol (Bystolic) '[]'$ Propranolol (Inderal) '[]'$ Sotalol (Betapace)     Medication Currently Prescribed? If started since last visit: If not started since last visit: If stopped since last visit:  Aldosterone Antagonist '[x]'$ No '[]'$ Yes > '[]'$ Amiloride '[]'$ Eplerenone (Inspra) '[]'$ Spirinolactone (Aldactone) '[]'$ Traimterene (Dyrenium)   Calcium Channel Blocker '[x]'$ No '[]'$  Yes > Medication Name: '[]'$ Amlodipine (Norvasc) '[]'$ Diltiazem (Cardizem) '[]'$ Felodipine (Plendil) '[]'$ Nifedipine (Procardia) '[]'$ Verapamil (Calan)   Diuretic Loop '[x]'$ No '[]'$  Yes > Medication Name: '[]'$ Bumetanide (Bumex) '[]'$ Ethacrynic acid (Edecrin) '[]'$ Furosemide (Lasix) '[]'$ Torsemide (Demadex)   Diuretic Thiazide- type '[x]'$ No '[]'$  Yes > Medication Name: '[]'$ Chlorothiazide      '[]'$ Chlorthalidone '[]'$ Hydrochlorothiazide '[]'$ Indapamide '[]'$ Metolazone   Anticoagulation Therapy (other than Warfarin) '[x]'$ No '[]'$  Yes > Medication Name: '[]'$ Apixaban (Eliquis) '[]'$ Edoxaban (Lixiana) '[]'$ Rivaroxaban (Xeralto) '[]'$ Dabigatran (Redaxa)   Warfarin '[x]'$ No '[]'$  Yes    Antiplatelet Agent (including aspirin) '[]'$ No '[x]'$  Yes > Medication Name (check all that apply): '[x]'$ Aspirin '[]'$ Clopidogrel (Plavix) '[]'$ Prasugrel (Effient) '[]'$ Ticagrelor (Brilinta) '[]'$ Ticlopidine (Ticlid) '[]'$ Dipyridamole (Persantine)    Medication Currently Prescribed? If started since last visit: If not started since last visit: If stopped since last visit:  Statin  '[]'$ No >  '[x]'$ Yes > Since last visit, medication was: '[]'$ Stopped '[]'$ Not started '[]'$ Started '[x]'$ Continued same  medication and dose '[]'$ Continued with dose or medication changes Date started:        / /             MM DD YYYY Who prescribed? '[]'$ Cardiology provider '[]'$ Endocrinology provider '[]'$ Primary care provider '[]'$ Other provider  Specify:                             '[]'$ Unknown Reason (check all that apply): '[]'$ History of Rhabdomyolysis '[]'$ LDL-cholesterol already       <70 '[]'$ Muscle      aches/pain/weakness '[]'$ Mental       fogginess/memory loss '[]'$ Liver dysfunction '[]'$ Patient has  experienced other side   effects to this medication before '[]'$ Patient will be unable to adhere/monitor '[]'$ Patient unable to afford it '[]'$ Patient does not want to take this medication '[]'$ Pregnancy '[]'$ Other (specify: ) '[]'$ Unknown Reason Date discontinued:        / /             MM DD YYYY Reason (check all that apply): '[]'$ Rhabdomyolysis '[]'$ Muscle aches/pain/weakness '[]'$ Mental fogginess/memory loss '[]'$ Liver dysfunction '[]'$ Other medication side effects '[]'$ Patient unable to  adhere/monitor []Patient unable to afford it []Patient no longer wants to take       this medication []Pregnancy []Other (specify: ) []Unknown Reason   If started or changed  Medication Name: []Atorvastatin (Lipitor) []Fluvastatin (Lescol) []Lovastatin (Mevacor) []Pravastatin (Pravachol) []Rosuvastatin (Crestor) []Simvastatin (Zocor) []Pitatavastatin (Livalo)  Dose: []1 mg []10 mg []2 mg [] 20 mg []3 mg [] 40 mg []4 mg [] 60 mg []5 mg [] 80 mg  Frequency: []Daily  []Less than daily      Does the patient have statin intolerance that prevents the use of maximum dose of high potency statin? []No []Yes >IF YES, Complete Statin Intolerance form   Non-statin lipid lowering therapy [x]No []Yes > Medication Name (check all that apply): []Colesevelam (Welchol) []Ezetimibe (Zetia) []Fibrate []Niacin []PCSK9 inhibitor []Omega 3 acid ethyl esters (Lovaza) []Icosapent Ethyl (Vascepa) []Over the counter omega 3 fatty acid or fish oil  supplement     Medication Currently Prescribed? If started since last visit: If not started since last visit: If stopped since last visit:  Diabetes Medications  SGLT2 Inhibitor  []No >  [x]Yes > Since last visit, medication was: []Stopped []Not started []Started [x]Continued same medication []Continued with medication changes Date started:        / /             MM DD YYYY Who prescribed? []Cardiology provider  []Study clinic []Outside clinic []Endocrinology      provider []Primary care provider []Other provider  Specify:                             []Unknown Reason (check all that apply): []eGFR <45 []HbA1c<7% on metformin monotherapy OR already on GLP1RA and do not need to start another anti- hyperglycemic []Already dehydrated []Low blood pressure []High risk of Hypoglycemia []Prior DKA []Recurrent mycotic genital infections []History of or at risk for amputation []Patient has experienced other side effects to this medication before []Patient will be unable to adhere/monitor []Patient unable to afford it []Patient does not want to take this medication []Pregnancy []Other (specify: ) []Unknown Reason Date discontinued:        / /             MM DD YYYY Reason (check all that apply  []eGFR now <45 []Dehydration []Low blood pressure []Hypoglycemia []DKA []Mycotic genital infection []Amputation []Other medication side effects []Patient unable    to adhere/monitor  []Had an operation/procedure     that required stopping it []Patient unable to afford it []Patient no longer wants to      take this medication []Pregnancy []Other (specify: ) []Unknown Reason   If started or changed  Medication Name: []Canaglifozin (Invokana) []Dapagliflozin (Farxiga) []Empaglifozin (Jardiance) []Ertugliflozin (Steglatro)           Medication Currently Prescribed? If started since last visit: If not started since last visit: If stopped since last visit:  GLP1  Receptor Agonist  [x]No >  []Yes > Since last visit, medication was: []Stopped []Not started []Started []Continued same medication []Continued with medication changes Date started:        / /             MM DD YYYY Who prescribed? []Cardiology provider  []Study clinic []Outside clinic []Endocrinology       provider []Primary care provider []Other provider > Specify:                             []  Unknown Reason (check all that apply): '[]'$ Personal or family history of medullary thyroid cancer '[]'$ MEN2 '[]'$ HbA1c<7% on metformin monotherapy OR already on SGLT2i and do not need to start another anti-hyperglycemic '[]'$ eGFR now <30 '[]'$ High risk of Hypoglycemia '[]'$ History of pancreatitis '[]'$ Significant gastroparesis '[]'$ Prior gastric surgery '[]'$ Patient has experienced other side effects to this medication before '[]'$ Patient will be unable to adhere/monitor '[]'$ Patient unable to afford it '[]'$ Patient does not want to take this medication '[]'$ Pregnancy '[]'$ Other (specify: ) '[]'$ Unknown Reason Date discontinued:        / /             MM DD YYYY Reason (check all that apply): '[]'$ Medullary thyroid cancer '[]'$ MEN2 '[]'$ eGFR now <30 '[]'$ Hypoglycemia '[]'$ Pancreatitis '[]'$ Significant gastroparesis '[]'$ Gastric surgery '[]'$ Other medication side       effects '[]'$   Patient  unable    to adhere/monitor                                  '[]'$ Had an operation/procedure that required stopping it '[]'$ Patient unable to afford it '[]'$ Patient no longer wants to take this medication '[]'$ Pregnancy '[]'$ Other (specify: ) '[]'$ Unknown Reason   If started or changed > Medication Name: '[]'$ Albiglutide (Tanzeum) '[]'$ Dulaglutide (Trulicity) '[]'$ Exanatide (Byetta, Bydureon) '[]'$ Liraglutide (Victoza, Saxenda) '[]'$ Lixisenatide (Adlyxin) '[]'$ Semaglutice (Ozempic)      Medication Currently Prescribed? If started since last visit: If not started since last visit: If stopped since last visit:  Other non Insulin diabetes medications '[x]'$ No '[]'$ Yes >  Medication Name (check all that apply): '[]'$ Acarbose (Precose) '[]'$ Miglitol (Glyset) '[]'$ Glimepiride (Amaryl) '[]'$ Glipizide (Amaryl) '[]'$ Glyburide (Diabeta,       Glynase,   Micronase) '[]'$ Metformin (Fortamet,        Glucophage[including XR],        Glumetza, Riomet) '[]'$ Pioglitazone (Actos) '[]'$ Nateglinide (Starlix) '[]'$ Pramlintide (Symilin) '[]'$ Repaglinide (Prandin) '[]'$ Rosiglitazone (Avandia) '[]'$ Alogliptin (Nesina) '[]'$ Linagliptin (Tradjenta) '[]'$ Saxagliptin (Onglyza) '[]'$ Sitagliptin (Januvia) '[]'$ Bromocriptine Quick Release (Cycloset)     Insulin '[]'$ No '[x]'$  Yes > total daily dose: units 90     STATIN INTOLERANCE (PER EHR/OTHER SOURCE DATA)  1. Was CK checked? '[]'$ No '[]'$ Yes   >If yes, select from the following: '[]'$ CK not elevated '[]'$ CK elevated 1-5x upper limit of normal '[]'$ CK elevated >5x upper limit of normal  2. Does the patient have muscle symptoms? '[]'$ No '[]'$ Yes    >If yes, select from the following: Location and pattern of muscle symptoms (select all that apply) '[]'$ Symmetric, hip flexors or thighs '[]'$ Symmetric, calves '[]'$ Symmetric, proximal upper extremity '[]'$ Asymmetric, intermittent, or not specific to any area '[]'$ Unknown   Timing of muscle symptom in relation to starting statin regimen '[]'$ <4 weeks '[]'$ 4-12 weeks '[]'$ >12 weeks '[]'$ Unknown   Timing of muscle symptoms improvement after withdrawal of statin '[]'$ <2 weeks '[]'$ 2-4 weeks '[]'$ No improvement after 4 weeks '[]'$ Unknown  3. Was patient re-challenged with a statin regimen (even if same statin compound or regimen as above)?  '[]'$ No  '[]'$ Yes  '[]'$ Unknown  >If yes, select from the following: Timing of recurrence of similar muscle symptoms in relation to starting second regimen '[]'$ <4 weeks '[]'$ 4-12 weeks '[]'$ >12 weeks '[]'$ Similar symptoms did not recur '[]'$ Unknown   [email or web address]

## 2019-12-03 ENCOUNTER — Encounter: Payer: Self-pay | Admitting: Urology

## 2019-12-04 ENCOUNTER — Ambulatory Visit (INDEPENDENT_AMBULATORY_CARE_PROVIDER_SITE_OTHER): Payer: Medicare Other | Admitting: Urology

## 2019-12-04 ENCOUNTER — Other Ambulatory Visit: Payer: Self-pay

## 2019-12-04 ENCOUNTER — Other Ambulatory Visit: Payer: Self-pay | Admitting: Urology

## 2019-12-04 ENCOUNTER — Encounter: Payer: Self-pay | Admitting: Urology

## 2019-12-04 VITALS — BP 152/88 | HR 94 | Temp 98.2°F | Ht 71.0 in | Wt 191.0 lb

## 2019-12-04 DIAGNOSIS — R972 Elevated prostate specific antigen [PSA]: Secondary | ICD-10-CM | POA: Diagnosis not present

## 2019-12-04 DIAGNOSIS — R32 Unspecified urinary incontinence: Secondary | ICD-10-CM

## 2019-12-04 DIAGNOSIS — N400 Enlarged prostate without lower urinary tract symptoms: Secondary | ICD-10-CM | POA: Diagnosis not present

## 2019-12-04 LAB — POCT URINALYSIS DIPSTICK
Bilirubin, UA: NEGATIVE
Glucose, UA: POSITIVE — AB
Ketones, UA: NEGATIVE
Leukocytes, UA: NEGATIVE
Nitrite, UA: NEGATIVE
Protein, UA: POSITIVE — AB
Spec Grav, UA: 1.01 (ref 1.010–1.025)
Urobilinogen, UA: NEGATIVE E.U./dL — AB
pH, UA: 5 (ref 5.0–8.0)

## 2019-12-04 MED ORDER — LEVOFLOXACIN 750 MG PO TABS: ORAL_TABLET | ORAL | 0 refills | Status: DC

## 2019-12-04 NOTE — Progress Notes (Signed)
Subjective: 1. Elevated PSA   2. Benign prostatic hyperplasia without lower urinary tract symptoms   3. Enuresis     [name] is a 59 yo male who is sent in consultation by Dr. Cindie Laroche for an elevated PSA of 12.6 on 09/03/19.  Prior levels are not available. He is voiding well with an IPSS of 1.  He has nocturia x 1.  He has been on Myrbetriq 25mg  for an episode of enuresis 53mo ago.   He has mild post void dribbling.  He has a history of CAD and has ED.   He has no history of UTI's or GU surgery.   He had no other new complaints at the time of the blood draw.    ROS:  Review of Systems  Respiratory: Positive for cough.   Cardiovascular: Positive for chest pain and leg swelling.  Musculoskeletal: Positive for back pain and joint pain.  Endo/Heme/Allergies: Positive for polydipsia.  All other systems reviewed and are negative.   No Known Allergies  Past Medical History:  Diagnosis Date  . Arthritis    "some; in hips sometimes" (01/02/2013)  . Back pain    "w/prolonged walking or standing" (01/02/2013)  . CAD (coronary artery disease)    CABG X 4 08/2012  . COPD (chronic obstructive pulmonary disease) ([place])   . Elevated PSA   . Esophageal erosions 05/24/01   egd by Dr. Gala Romney  . GERD (gastroesophageal reflux disease)   . H/O hiatal hernia   . History of gout   . Hyperlipidemia   . Hypertension    not on any medication for htn at present  . PAD (peripheral artery disease) ([place])    LEA DOPPLER, 11/11/2012 - moderate arterial insufficiency to lower extremities at rest, BILATERAL SFA-demonstrates occlusive disease with reconstitution of flow  . Tobacco abuse   . Tubular adenoma of colon 08/28/11  . Type II diabetes mellitus ([place])   . Unintentional weight loss     Past Surgical History:  Procedure Laterality Date  . ANTERIOR CERVICAL DECOMP/DISCECTOMY FUSION  12/19/2011   Procedure: ANTERIOR CERVICAL DECOMPRESSION/DISCECTOMY FUSION 2 LEVELS;  Surgeon: Floyce Stakes, MD;   Location: [facility];  Service: Neurosurgery;  Laterality: N/A;  Cervical four-five Cervical five-six  Anterior cervical decompression/diskectomy, fusion, plate  . ATHERECTOMY N/A 01/02/2013   Procedure: ATHERECTOMY;  Surgeon: Lorretta Harp, MD;  Location: [facility] CATH LAB;  Service: Cardiovascular;  Laterality: N/A;  . BACK SURGERY     x3  . CARDIAC CATHETERIZATION  08/19/2012   CABG warranted; Severe ostial LCx stenosis with mid to distal vessel occlusion  . COLONOSCOPY W/ POLYPECTOMY  08/28/11   Rourk-tubular adenomas removed from ascending colon, suboptimal prep, diminutive rectal polyps  . COLONOSCOPY WITH PROPOFOL N/A 06/13/2017   Procedure: COLONOSCOPY WITH PROPOFOL;  Surgeon: Daneil Dolin, MD;  Location: [facility];  Service: Endoscopy;  Laterality: N/A;  9:15am  . CORONARY ARTERY BYPASS GRAFT  08/22/2012   Procedure: CORONARY ARTERY BYPASS GRAFTING (CABG);  Surgeon: Grace Isaac, MD;  Location: [place];  Service: Open Heart Surgery;  Laterality: N/A;  CABG x four,  using left internal mammary artery and right leg greater saphenous vein harvested endoscopically  . ESOPHAGOGASTRODUODENOSCOPY  05/24/01   by Dr. Gala Romney  . ESOPHAGOGASTRODUODENOSCOPY  08/28/11   Rourk-erosive reflux esophagitis, gastric erosions  . ESOPHAGOGASTRODUODENOSCOPY (EGD) WITH PROPOFOL N/A 06/13/2017   Procedure: ESOPHAGOGASTRODUODENOSCOPY (EGD) WITH PROPOFOL;  Surgeon: Daneil Dolin, MD;  [facility];  Service: Endoscopy;  Laterality: N/A;  . INTRAOPERATIVE TRANSESOPHAGEAL ECHOCARDIOGRAM  08/22/2012   Procedure: INTRAOPERATIVE TRANSESOPHAGEAL ECHOCARDIOGRAM;  Surgeon: Grace Isaac, MD;  Location: [place];  Service: Open Heart Surgery;  Laterality: N/A;  . LEFT HEART CATHETERIZATION WITH CORONARY ANGIOGRAM N/A 08/19/2012   Procedure: LEFT HEART CATHETERIZATION WITH CORONARY ANGIOGRAM;  Surgeon: [name], MD;  Location: [facility] CATH LAB;  Service: Cardiovascular;  Laterality: N/A;  . LOWER  EXTREMITY ANGIOGRAM N/A 11/17/2012   Procedure: LOWER EXTREMITY ANGIOGRAM;  Surgeon: Lorretta Harp, MD;  Location: [facility] CATH LAB;  Service: Cardiovascular;  Laterality: N/A;  . LOWER EXTREMITY ANGIOGRAM N/A 02/02/2013   Procedure: LOWER EXTREMITY ANGIOGRAM;  Surgeon: Lorretta Harp, MD;  Location: [facility] CATH LAB;  Service: Cardiovascular;  Laterality: N/A;  . LUMBAR [place]    . LUMBAR FUSION  ~ 2011  . PELVIC ABCESS DRAINAGE     x2  . POLYPECTOMY  06/13/2017   Procedure: POLYPECTOMY;  Surgeon: Daneil Dolin, MD;  [facility];  Service: Endoscopy;;  colon  . SHOULDER SURGERY Right   . VASCULAR SURGERY Right 01/02/2013   diamondback orbital rotational  atherectomy, chocolate balloon and IDEV  Stent.    Social History   Socioeconomic History  . Marital status: Single    Spouse name: Not on file  . Number of children: 1  . Years of education: Not on file  . Highest education level: Not on file  Occupational History  . Occupation: disabled    Employer: DISABLED  Tobacco Use  . Smoking status: Current Every Day Smoker    Packs/day: 1.00    Years: 39.00    Pack years: 39.00    Types: Cigarettes  . Smokeless tobacco: Never Used  Substance and Sexual Activity  . Alcohol use: Yes    Alcohol/week: 6.0 standard drinks    Types: 6 Cans of beer per week  . Drug use: No  . Sexual activity: Not Currently  Other Topics Concern  . Not on file  Social History Narrative   Lives w/ girlfriend   Social Determinants of Health   Financial Resource Strain:   . Difficulty of Paying Living Expenses:   Food Insecurity:   . Worried About Charity fundraiser in the Last Year:   . Arboriculturist in the Last Year:   Transportation Needs:   . Film/video editor (Medical):   Marland Kitchen Lack of Transportation (Non-Medical):   Physical Activity:   . Days of Exercise per Week:   . Minutes of Exercise per Session:   Stress:   . Feeling of Stress :   Social Connections:   . Frequency  of Communication with Friends and Family:   . Frequency of Social Gatherings with Friends and Family:   . Attends Religious Services:   . Active Member of Clubs or Organizations:   . Attends Archivist Meetings:   Marland Kitchen Marital Status:   Intimate Partner Violence:   . Fear of Current or Ex-Partner:   . Emotionally Abused:   Marland Kitchen Physically Abused:   . Sexually Abused:     Family History  Problem Relation Age of Onset  . Anesthesia problems Neg Hx   . Colon cancer Neg Hx   . Gastric cancer Neg Hx   . Esophageal cancer Neg Hx     Anti-infectives: Anti-infectives (From admission, onward)   Start     Dose/Rate Route Frequency Ordered Stop   12/04/19 0000  levofloxacin (  LEVAQUIN) 750 MG tablet        12/04/19 1105        Current Outpatient Medications  Medication Sig Dispense Refill  . aspirin EC 81 MG EC tablet Take 1 tablet (81 mg total) by mouth daily.    . clopidogrel (PLAVIX) 75 MG tablet Take 1 tablet (75 mg total) by mouth daily with breakfast. 30 tablet 11  . cyclobenzaprine (FLEXERIL) 10 MG tablet Take 1 tablet (10 mg total) by mouth 3 (three) times daily as needed for muscle spasms. 50 tablet 1  . docusate sodium (COLACE) 100 MG capsule Take 1 capsule (100 mg total) by mouth 2 (two) times daily. 60 capsule 0  . empagliflozin (JARDIANCE) 10 MG TABS tablet Take 10 mg by mouth daily. 90 tablet 3  . gabapentin (NEURONTIN) 300 MG capsule Take 1 capsule by mouth daily.    Marland Kitchen HUMALOG MIX 75/25 KWIKPEN (75-25) 100 UNIT/ML Kwikpen Inject 30-60 Units into the skin 2 (two) times daily. Patient takes 30 units in the morning and 60 units at night    . icosapent Ethyl (VASCEPA) 1 g capsule TAKE (2) CAPSULES BY MOUTH TWICE DAILY. 120 capsule 5  . isosorbide mononitrate (IMDUR) 30 MG 24 hr tablet Take 1 tablet (30 mg total) by mouth daily. 30 tablet 1  . LANTUS SOLOSTAR 100 UNIT/ML Solostar Pen     . losartan (COZAAR) 25 MG tablet TAKE ONE TABLET BY MOUTH ONCE DAILY. 90 tablet 0  .  metoprolol succinate (TOPROL-XL) 25 MG 24 hr tablet TAKE 1 TABLET BY MOUTH ONCE A DAY. 90 tablet 3  . MYRBETRIQ 25 MG TB24 tablet Take 25 mg by mouth daily.    Marland Kitchen oxyCODONE-acetaminophen (PERCOCET/ROXICET) 5-325 MG tablet Take 1 tablet by mouth every 6 (six) hours as needed.    Marland Kitchen PROAIR HFA 108 (90 BASE) MCG/ACT inhaler Inhale 2 puffs into the lungs every 6 (six) hours as needed for wheezing or shortness of breath.     . rosuvastatin (CRESTOR) 40 MG tablet Take 1 tablet (40 mg total) by mouth daily. 30 tablet 1  . levofloxacin (LEVAQUIN) 750 MG tablet Take 1 po 1 hour prior to the procedure. 1 tablet 0   No current facility-administered medications for this visit.     Objective: Vital signs in last 24 hours: BP (!) 152/88   Pulse 94   Temp 98.2 F (36.8 C)   Ht 5\' 11"  (1.803 m)   Wt 191 lb (86.6 kg)   BMI 26.64 kg/m   Intake/Output from previous day: No intake/output data recorded. Intake/Output this shift: @IOTHISSHIFT @   Physical Exam Vitals reviewed.  Constitutional:      Appearance: Normal appearance.  HENT:     Head: Normocephalic and atraumatic.  Cardiovascular:     Rate and Rhythm: Normal rate and regular rhythm.     Heart sounds: Normal heart sounds.  Pulmonary:     Effort: Pulmonary effort is normal. No respiratory distress.     Breath sounds: Normal breath sounds.  Abdominal:     Palpations: Abdomen is soft. There is no mass.     Tenderness: There is no abdominal tenderness.     Hernia: No hernia is present.  Genitourinary:    Comments: Normal phallus with adequate meatus. Scrotum normal. Testes slightly atrophied.  Epididymis normal.  AP without lesions. NST without mass. Prostate 1.5-2+ without nodules, SV non-palpable.  Musculoskeletal:        General: Tenderness present. No swelling. Normal range of motion.  Cervical back: Normal range of motion and neck supple.  Lymphadenopathy:     Cervical: No cervical adenopathy.  Skin:    General: Skin is  warm and dry.  Neurological:     General: No focal deficit present.     Mental Status: He is alert and oriented to person, place, and time.  Psychiatric:        Mood and Affect: Mood normal.        Behavior: Behavior normal.     Lab Results:  Results for orders placed or performed in visit on 12/04/19 (from the past 24 hour(s))  POCT urinalysis dipstick     Status: Abnormal   Collection Time: 12/04/19 10:47 AM  Result Value Ref Range   Color, UA lt yellow    Clarity, UA clear    Glucose, UA Positive (A) Negative   Bilirubin, UA neg    Ketones, UA neg    Spec Grav, UA 1.010 1.010 - 1.025   Blood, UA intact    pH, UA 5.0 5.0 - 8.0   Protein, UA Positive (A) Negative   Urobilinogen, UA negative (A) 0.2 or 1.0 E.U./dL   Nitrite, UA neg    Leukocytes, UA Negative Negative   Appearance clear    Odor      BMET No results for input(s): NA, K, CL, CO2, GLUCOSE, BUN, CREATININE, CALCIUM in the last 72 hours. PT/INR No results for input(s): LABPROT, INR in the last 72 hours. ABG No results for input(s): PHART, HCO3 in the last 72 hours.  Invalid input(s): PCO2, PO2  Studies/Results: No results found.   Assessment/Plan: Elevated PSA of 12.6 with a benign exam and minimal LUTS.   I am going to repeat a PSA today and if it remains elevated we will need to consider a biopsy.  I have sent levaquin for the prep and have reviewed the risks of bleeding, infection and a voiding difficulty.  Enuresis x 1.  I recommended he try to stop the Myrbetriq and see how he does without it.  ED with CAD.      Meds ordered this encounter  Medications  . levofloxacin (LEVAQUIN) 750 MG tablet    Sig: Take 1 po 1 hour prior to the procedure.    Dispense:  1 tablet    Refill:  0     Orders Placed This Encounter  Procedures  . [place] PROSTATE BIOPSY MULTIPLE    Standing Status:   Future    Standing Expiration Date:   02/02/2021    Order Specific Question:   Reason for Exam (SYMPTOM  OR DIAGNOSIS  REQUIRED)    Answer:   elevated PSA    Order Specific Question:   Preferred location?    Answer:   [facility]  . PSA, total and free    Standing Status:   Future    Standing Expiration Date:   12/03/2020  . POCT urinalysis dipstick     Return for Needs prostate biopsy,  I will call him with the biopsy results before scheduling further f/u. Marland Kitchen    CC: Lucia Gaskins MD.     Irine Seal 12/04/2019 [phone]

## 2019-12-04 NOTE — Patient Instructions (Addendum)
   Patient Name:[name] Appointment Time:8:15 a.m. Please arrive by 8:00a.m. Appointment Date:12/25/19  Location: [name]   Prostate Biopsy Instructions  Stop all aspirin or blood thinners (aspirin, plavix, coumadin, warfarin, motrin, ibuprofen, advil, aleve, naproxen, naprosyn) for 7 days prior to the procedure.  If you have any questions about stopping these medications, please contact your primary care physician or cardiologist.  Having a light meal prior to the procedure is recommended.  If you are diabetic or have low blood sugar please bring a small snack or glucose tablet.  A Fleets enema is needed to be purchased over the counter at a local pharmacy and used 2 hours before you scheduled appointment.  This can be purchased over the counter at any pharmacy.  Antibiotics will be administered in the clinic at the time of the procedure and 1 tablet has been sent to your pharmacy. Please take the antibiotic as prescribed.    Please bring someone with you to the procedure to drive you home.   If you have any questions or concerns, please feel free to call the office at [phone] or send a Mychart message.    Thank you, [facility]

## 2019-12-07 ENCOUNTER — Other Ambulatory Visit: Payer: Self-pay | Admitting: Urology

## 2019-12-08 ENCOUNTER — Inpatient Hospital Stay (HOSPITAL_COMMUNITY): Admission: RE | Admit: 2019-12-08 | Payer: Medicare Other | Source: Ambulatory Visit

## 2019-12-08 ENCOUNTER — Encounter (HOSPITAL_COMMUNITY): Payer: Medicare Other

## 2019-12-08 LAB — PSA, TOTAL AND FREE
PSA, % Free: 100 % (calc) (ref >25)
PSA, Free: 0.1 ng/mL
PSA, Total: 0.1 ng/mL (ref <=4.0)

## 2019-12-11 DIAGNOSIS — G8929 Other chronic pain: Secondary | ICD-10-CM | POA: Diagnosis not present

## 2019-12-11 DIAGNOSIS — E1165 Type 2 diabetes mellitus with hyperglycemia: Secondary | ICD-10-CM | POA: Diagnosis not present

## 2019-12-11 DIAGNOSIS — E7849 Other hyperlipidemia: Secondary | ICD-10-CM | POA: Diagnosis not present

## 2019-12-11 DIAGNOSIS — K219 Gastro-esophageal reflux disease without esophagitis: Secondary | ICD-10-CM | POA: Diagnosis not present

## 2019-12-15 ENCOUNTER — Encounter (HOSPITAL_COMMUNITY): Payer: Medicare Other

## 2019-12-21 ENCOUNTER — Telehealth: Payer: Self-pay

## 2019-12-21 NOTE — Telephone Encounter (Signed)
Called pt. to make sure he knew to stop plavix due to message sent saying pt. had not read my chart message. Pt. states that he had already stopped the plavix.

## 2019-12-25 ENCOUNTER — Other Ambulatory Visit: Payer: Self-pay

## 2019-12-25 ENCOUNTER — Other Ambulatory Visit: Payer: Self-pay | Admitting: Urology

## 2019-12-25 ENCOUNTER — Encounter (HOSPITAL_COMMUNITY): Payer: Self-pay

## 2019-12-25 ENCOUNTER — Ambulatory Visit (HOSPITAL_COMMUNITY)
Admission: RE | Admit: 2019-12-25 | Discharge: 2019-12-25 | Disposition: A | Discharge status: Home / Self Care | Payer: Medicare Other | Source: Ambulatory Visit | Attending: Urology | Admitting: Urology

## 2019-12-25 ENCOUNTER — Ambulatory Visit (INDEPENDENT_AMBULATORY_CARE_PROVIDER_SITE_OTHER): Payer: Medicare Other | Admitting: Urology

## 2019-12-25 ENCOUNTER — Encounter: Payer: Self-pay | Admitting: Urology

## 2019-12-25 DIAGNOSIS — R972 Elevated prostate specific antigen [PSA]: Secondary | ICD-10-CM | POA: Diagnosis present

## 2019-12-25 MED ORDER — CEFTRIAXONE SODIUM 1 G IJ SOLR
INTRAMUSCULAR | Status: AC
  Administered 2019-12-25: 1 g via INTRAMUSCULAR
  Filled 2019-12-25: qty 10

## 2019-12-25 MED ORDER — LIDOCAINE HCL (PF) 1 % IJ SOLN
INTRAMUSCULAR | Status: AC
  Filled 2019-12-25: qty 5

## 2019-12-25 MED ORDER — LIDOCAINE HCL (PF) 2 % IJ SOLN
INTRAMUSCULAR | Status: AC
  Filled 2019-12-25: qty 10

## 2019-12-25 MED ORDER — CEFTRIAXONE SODIUM 1 G IJ SOLR: 1.0000 g | Freq: Once | INTRAMUSCULAR | Status: AC

## 2019-12-25 NOTE — Progress Notes (Signed)
Prostate Biopsy Procedure   Informed consent was obtained after discussing risks/benefits of the procedure.  A time out was performed to ensure correct patient identity.  Pre-Procedure: - Last PSA Level: No results found for: PSA - Gentamicin given prophylactically - Levaquin 500 mg administered PO -Transrectal Ultrasound performed revealing a 17 gm prostate -No significant hypoechoic or median lobe noted  Procedure: - Prostate block performed using 10 cc 1% lidocaine and biopsies taken from sextant areas, a total of 12 under ultrasound guidance.  Post-Procedure: - Patient tolerated the procedure well - He was counseled to seek immediate medical attention if experiences any severe pain, significant bleeding, or fevers - Return in one week to discuss biopsy results

## 2019-12-30 ENCOUNTER — Telehealth: Payer: Self-pay

## 2019-12-30 ENCOUNTER — Other Ambulatory Visit: Payer: Self-pay

## 2019-12-30 DIAGNOSIS — R972 Elevated prostate specific antigen [PSA]: Secondary | ICD-10-CM

## 2019-12-30 NOTE — Telephone Encounter (Signed)
Pt. called and made aware of negative biopsy results along with 6 month f/u OV w/ PSA. Appt date, time, and lab sheet mailed to pt.

## 2019-12-30 NOTE — Progress Notes (Signed)
Please let him know that the biopsy was negative.   He will need f/u in 6 months with a PSA, if he doesn't have a f/u visit scheduled.

## 2020-01-14 DIAGNOSIS — E7849 Other hyperlipidemia: Secondary | ICD-10-CM | POA: Diagnosis not present

## 2020-01-14 DIAGNOSIS — E1165 Type 2 diabetes mellitus with hyperglycemia: Secondary | ICD-10-CM | POA: Diagnosis not present

## 2020-01-14 DIAGNOSIS — M542 Cervicalgia: Secondary | ICD-10-CM | POA: Diagnosis not present

## 2020-02-22 ENCOUNTER — Other Ambulatory Visit: Payer: Self-pay | Admitting: Internal Medicine

## 2020-03-14 DIAGNOSIS — E1165 Type 2 diabetes mellitus with hyperglycemia: Secondary | ICD-10-CM | POA: Diagnosis not present

## 2020-03-14 DIAGNOSIS — M545 Low back pain: Secondary | ICD-10-CM | POA: Diagnosis not present

## 2020-03-14 DIAGNOSIS — M2550 Pain in unspecified joint: Secondary | ICD-10-CM | POA: Diagnosis not present

## 2020-03-14 DIAGNOSIS — G8929 Other chronic pain: Secondary | ICD-10-CM | POA: Diagnosis not present

## 2020-03-14 DIAGNOSIS — K219 Gastro-esophageal reflux disease without esophagitis: Secondary | ICD-10-CM | POA: Diagnosis not present

## 2020-04-26 ENCOUNTER — Other Ambulatory Visit: Payer: Self-pay | Admitting: Internal Medicine

## 2020-05-02 ENCOUNTER — Telehealth: Payer: Self-pay | Admitting: Internal Medicine

## 2020-05-02 NOTE — Telephone Encounter (Signed)
Pt has been exposed to [place] and he is now having symptoms such as cough and fever... he is unvaccinated and has not been tested yet.. he will work on getting an appt for a test and he will put in another call to Dr. Cindie Laroche his PCP. Pt advised to quarantine and be sure to hydrate well and if symptoms worsen to call his local ED Urgent Care and be sure to wear a mask at all times.

## 2020-05-02 NOTE — Telephone Encounter (Signed)
    Pt might have exposed to covid, his friend was tested positive last Saturday and he had a fever yesterday. He said he tried to call his pcp but no one answered so he is calling Dr. Debara Pickett to get a prescription.

## 2020-05-03 ENCOUNTER — Other Ambulatory Visit: Payer: Medicare Other

## 2020-05-03 ENCOUNTER — Other Ambulatory Visit: Payer: Self-pay

## 2020-05-03 DIAGNOSIS — Z20822 Contact with and (suspected) exposure to covid-19: Secondary | ICD-10-CM | POA: Diagnosis not present

## 2020-05-04 LAB — SARS-COV-2, NAA 2 DAY TAT

## 2020-05-04 LAB — NOVEL CORONAVIRUS, NAA: SARS-CoV-2, NAA: DETECTED — AB

## 2020-05-05 ENCOUNTER — Other Ambulatory Visit (HOSPITAL_COMMUNITY): Payer: Self-pay | Admitting: Adult Health

## 2020-05-05 ENCOUNTER — Encounter: Payer: Self-pay | Admitting: Adult Health

## 2020-05-05 ENCOUNTER — Telehealth: Payer: Self-pay | Admitting: Internal Medicine

## 2020-05-05 ENCOUNTER — Encounter: Payer: Self-pay | Admitting: Internal Medicine

## 2020-05-05 DIAGNOSIS — U071 COVID-19: Secondary | ICD-10-CM

## 2020-05-05 NOTE — Progress Notes (Signed)
I connected by phone with [name] on 05/05/2020 at 9:28 AM to discuss the potential use of a new treatment for mild to moderate COVID-19 viral infection in non-hospitalized patients.  This patient is a 59 y.o. male that meets the FDA criteria for Emergency Use Authorization of COVID monoclonal antibody casirivimab/imdevimab or bamlanivimab/eteseviamb.  Has a (+) direct SARS-CoV-2 viral test result  Has mild or moderate COVID-19   Is NOT hospitalized due to COVID-19  Is within 10 days of symptom onset  Has at least one of the high risk factor(s) for progression to severe COVID-19 and/or hospitalization as defined in EUA.  Specific high risk criteria : BMI > 25, Diabetes and Cardiovascular disease or hypertension   Sx onset 04/30/2020   I have spoken and communicated the following to the patient or parent/caregiver regarding COVID monoclonal antibody treatment:  1. FDA has authorized the emergency use for the treatment of mild to moderate COVID-19 in adults and pediatric patients with positive results of direct SARS-CoV-2 viral testing who are 53 years of age and older weighing at least 40 kg, and who are at high risk for progressing to severe COVID-19 and/or hospitalization.  2. The significant known and potential risks and benefits of COVID monoclonal antibody, and the extent to which such potential risks and benefits are unknown.  3. Information on available alternative treatments and the risks and benefits of those alternatives, including clinical trials.  4. Patients treated with COVID monoclonal antibody should continue to self-isolate and use infection control measures (e.g., wear mask, isolate, social distance, avoid sharing personal items, clean and disinfect "high touch" surfaces, and frequent handwashing) according to CDC guidelines.   5. The patient or parent/caregiver has the option to accept or refuse COVID monoclonal antibody treatment.  After reviewing this  information with the patient, the patient has agreed to receive one of the available covid 19 monoclonal antibodies and will be provided an appropriate fact sheet prior to infusion. Scot Dock, NP 05/05/2020 9:28 AM

## 2020-05-05 NOTE — Telephone Encounter (Signed)
Left voicemail for patient after positively identifying him - notified of COVID 19 positive test. I recommend he go to the [facility] monoclonal antibody infusion clinic for treatment. Will try to place a referral.  [name], MD, FACC, [place] Director of the [facility] Diplomate of the American Board of Clinical Lipidology Attending Cardiologist  Direct Dial: [phone]  Fax: [phone]  Website:  [email or web address]

## 2020-05-06 ENCOUNTER — Inpatient Hospital Stay (HOSPITAL_COMMUNITY): Admission: RE | Admit: 2020-05-06 | Payer: Medicare Other | Source: Ambulatory Visit

## 2020-05-07 ENCOUNTER — Ambulatory Visit (HOSPITAL_COMMUNITY)
Admission: RE | Admit: 2020-05-07 | Discharge: 2020-05-07 | Disposition: A | Discharge status: Home / Self Care | Payer: Medicare Other | Source: Ambulatory Visit | Attending: Pulmonary Disease | Admitting: Pulmonary Disease

## 2020-05-07 DIAGNOSIS — Z23 Encounter for immunization: Secondary | ICD-10-CM | POA: Insufficient documentation

## 2020-05-07 DIAGNOSIS — U071 COVID-19: Secondary | ICD-10-CM | POA: Diagnosis present

## 2020-05-07 MED ORDER — DIPHENHYDRAMINE HCL 50 MG/ML IJ SOLN: 50.0000 mg | Freq: Once | INTRAMUSCULAR | Status: DC | PRN

## 2020-05-07 MED ORDER — METHYLPREDNISOLONE SODIUM SUCC 125 MG IJ SOLR: 125.0000 mg | Freq: Once | INTRAMUSCULAR | Status: DC | PRN

## 2020-05-07 MED ORDER — EPINEPHRINE 0.3 MG/0.3ML IJ SOAJ: 0.3000 mg | Freq: Once | INTRAMUSCULAR | Status: DC | PRN

## 2020-05-07 MED ORDER — SODIUM CHLORIDE 0.9 % IV SOLN: INTRAVENOUS | Status: DC | PRN

## 2020-05-07 MED ORDER — ALBUTEROL SULFATE HFA 108 (90 BASE) MCG/ACT IN AERS: 2.0000 | INHALATION_SPRAY | Freq: Once | RESPIRATORY_TRACT | Status: DC | PRN

## 2020-05-07 MED ORDER — FAMOTIDINE IN NACL 20-0.9 MG/50ML-% IV SOLN: 20.0000 mg | Freq: Once | INTRAVENOUS | Status: DC | PRN

## 2020-05-07 MED ORDER — SODIUM CHLORIDE 0.9 % IV SOLN
1200.0000 mg | Freq: Once | INTRAVENOUS | Status: AC
  Administered 2020-05-07: 1200 mg via INTRAVENOUS

## 2020-05-07 NOTE — Progress Notes (Signed)
  Diagnosis: COVID-19  Physician: Dr Joya Gaskins  Procedure: Covid Infusion Clinic Med: casirivimab\imdevimab infusion - Provided patient with casirivimab\imdevimab fact sheet for patients, parents and caregivers prior to infusion.  Complications: No immediate complications noted.  Discharge: Discharged home   [name] 05/07/2020

## 2020-05-07 NOTE — Discharge Instructions (Signed)

## 2020-05-11 DIAGNOSIS — E7849 Other hyperlipidemia: Secondary | ICD-10-CM | POA: Diagnosis not present

## 2020-05-11 DIAGNOSIS — I119 Hypertensive heart disease without heart failure: Secondary | ICD-10-CM | POA: Diagnosis not present

## 2020-05-11 DIAGNOSIS — K219 Gastro-esophageal reflux disease without esophagitis: Secondary | ICD-10-CM | POA: Diagnosis not present

## 2020-05-11 DIAGNOSIS — E1165 Type 2 diabetes mellitus with hyperglycemia: Secondary | ICD-10-CM | POA: Diagnosis not present

## 2020-05-18 DIAGNOSIS — E1165 Type 2 diabetes mellitus with hyperglycemia: Secondary | ICD-10-CM | POA: Diagnosis not present

## 2020-05-18 DIAGNOSIS — G894 Chronic pain syndrome: Secondary | ICD-10-CM | POA: Diagnosis not present

## 2020-05-18 DIAGNOSIS — M5459 Other low back pain: Secondary | ICD-10-CM | POA: Diagnosis not present

## 2020-05-24 DIAGNOSIS — E1165 Type 2 diabetes mellitus with hyperglycemia: Secondary | ICD-10-CM | POA: Diagnosis not present

## 2020-05-24 DIAGNOSIS — E7849 Other hyperlipidemia: Secondary | ICD-10-CM | POA: Diagnosis not present

## 2020-05-24 DIAGNOSIS — I110 Hypertensive heart disease with heart failure: Secondary | ICD-10-CM | POA: Diagnosis not present

## 2020-06-14 DIAGNOSIS — E1165 Type 2 diabetes mellitus with hyperglycemia: Secondary | ICD-10-CM | POA: Diagnosis not present

## 2020-06-14 DIAGNOSIS — I110 Hypertensive heart disease with heart failure: Secondary | ICD-10-CM | POA: Diagnosis not present

## 2020-06-14 DIAGNOSIS — I352 Nonrheumatic aortic (valve) stenosis with insufficiency: Secondary | ICD-10-CM | POA: Diagnosis not present

## 2020-06-14 DIAGNOSIS — R9431 Abnormal electrocardiogram [ECG] [EKG]: Secondary | ICD-10-CM | POA: Diagnosis not present

## 2020-06-14 DIAGNOSIS — Z23 Encounter for immunization: Secondary | ICD-10-CM | POA: Diagnosis not present

## 2020-06-17 ENCOUNTER — Other Ambulatory Visit: Payer: Medicare Other

## 2020-06-17 ENCOUNTER — Other Ambulatory Visit: Payer: Self-pay

## 2020-06-17 DIAGNOSIS — R972 Elevated prostate specific antigen [PSA]: Secondary | ICD-10-CM

## 2020-06-18 LAB — PSA: Prostate Specific Ag, Serum: 0.3 ng/mL (ref 0.0–4.0)

## 2020-06-23 NOTE — Progress Notes (Signed)
Subjective: 1. Benign prostatic hyperplasia without lower urinary tract symptoms   2. Elevated PSA     [name] is a 59 yo male who is sent in consultation by Dr. Cindie Laroche for an elevated PSA of 12.6 on 09/03/19.  Prior levels are not available. He is voiding well with an IPSS of 1.  He has nocturia x 1.  He has been on Myrbetriq 25mg  for an episode of enuresis 25mo ago.   He has mild post void dribbling.  He has a history of CAD and has ED.   He has no history of UTI's or GU surgery.   He had no other new complaints at the time of the blood draw.   06/24/20: [name] returns today in f/u for his history of an elevated PSA with a negative biopsy on 12/25/19 by Dr. Alyson Ingles.  He had a prostate volume of 74ml.  His PSA on 06/17/20 was 0.3.  His PSA on 12/16/19 was 0.1.    He is voiding well with an IPSS of 1.   He had COVID on 05/03/20 and was treated with the infusion and did well.  He remains on myrbetriq for his history of enuresis.  He is on chronic oxycodone and is on 5-10mg  daily.  ROS:  ROS  No Known Allergies  Past Medical History:  Diagnosis Date  . Arthritis    "some; in hips sometimes" (01/02/2013)  . Back pain    "w/prolonged walking or standing" (01/02/2013)  . CAD (coronary artery disease)    CABG X 4 08/2012  . COPD (chronic obstructive pulmonary disease) ([place])   . Elevated PSA   . Esophageal erosions 05/24/01   egd by Dr. Gala Romney  . GERD (gastroesophageal reflux disease)   . H/O hiatal hernia   . History of gout   . Hyperlipidemia   . Hypertension    not on any medication for htn at present  . PAD (peripheral artery disease) ([place])    LEA DOPPLER, 11/11/2012 - moderate arterial insufficiency to lower extremities at rest, BILATERAL SFA-demonstrates occlusive disease with reconstitution of flow  . Tobacco abuse   . Tubular adenoma of colon 08/28/11  . Type II diabetes mellitus ([place])   . Unintentional weight loss     Past Surgical History:  Procedure Laterality Date  .  ANTERIOR CERVICAL DECOMP/DISCECTOMY FUSION  12/19/2011   Procedure: ANTERIOR CERVICAL DECOMPRESSION/DISCECTOMY FUSION 2 LEVELS;  Surgeon: Floyce Stakes, MD;  Location: [facility];  Service: Neurosurgery;  Laterality: N/A;  Cervical four-five Cervical five-six  Anterior cervical decompression/diskectomy, fusion, plate  . ATHERECTOMY N/A 01/02/2013   Procedure: ATHERECTOMY;  Surgeon: Lorretta Harp, MD;  Location: [facility] CATH LAB;  Service: Cardiovascular;  Laterality: N/A;  . BACK SURGERY     x3  . CARDIAC CATHETERIZATION  08/19/2012   CABG warranted; Severe ostial LCx stenosis with mid to distal vessel occlusion  . COLONOSCOPY W/ POLYPECTOMY  08/28/11   Rourk-tubular adenomas removed from ascending colon, suboptimal prep, diminutive rectal polyps  . COLONOSCOPY WITH PROPOFOL N/A 06/13/2017   Procedure: COLONOSCOPY WITH PROPOFOL;  Surgeon: Daneil Dolin, MD;  Location: [facility];  Service: Endoscopy;  Laterality: N/A;  9:15am  . CORONARY ARTERY BYPASS GRAFT  08/22/2012   Procedure: CORONARY ARTERY BYPASS GRAFTING (CABG);  Surgeon: Grace Isaac, MD;  Location: [place];  Service: Open Heart Surgery;  Laterality: N/A;  CABG x four,  using left internal mammary artery and right leg greater saphenous vein harvested  endoscopically  . ESOPHAGOGASTRODUODENOSCOPY  05/24/01   by Dr. Gala Romney  . ESOPHAGOGASTRODUODENOSCOPY  08/28/11   Rourk-erosive reflux esophagitis, gastric erosions  . ESOPHAGOGASTRODUODENOSCOPY (EGD) WITH PROPOFOL N/A 06/13/2017   Procedure: ESOPHAGOGASTRODUODENOSCOPY (EGD) WITH PROPOFOL;  Surgeon: Daneil Dolin, MD;  Location: [facility];  Service: Endoscopy;  Laterality: N/A;  . INTRAOPERATIVE TRANSESOPHAGEAL ECHOCARDIOGRAM  08/22/2012   Procedure: INTRAOPERATIVE TRANSESOPHAGEAL ECHOCARDIOGRAM;  Surgeon: Grace Isaac, MD;  Location: [place];  Service: Open Heart Surgery;  Laterality: N/A;  . LEFT HEART CATHETERIZATION WITH CORONARY ANGIOGRAM N/A 08/19/2012   Procedure: LEFT  HEART CATHETERIZATION WITH CORONARY ANGIOGRAM;  Surgeon: [name], MD;  Location: [facility] CATH LAB;  Service: Cardiovascular;  Laterality: N/A;  . LOWER EXTREMITY ANGIOGRAM N/A 11/17/2012   Procedure: LOWER EXTREMITY ANGIOGRAM;  Surgeon: Lorretta Harp, MD;  Location[facility] C[facility] LAB;  Service: Cardiovascular;  Laterality: N/A;  . LOWER EXTREMITY ANGIOGRAM N/A 02/02/2013   Procedure: LOWER EXTREMITY ANGIOGRAM;  Surgeon: Lorretta Harp, MD;  Location: [facility] CATH LAB;  Service: Cardiovascular;  Laterality: N/A;  . LUMBAR [place]    . LUMBAR FUSION  ~ 2011  . PELVIC ABCESS DRAINAGE     x2  . POLYPECTOMY  06/13/2017   Procedure: POLYPECTOMY;  Surgeon: Daneil Dolin, MD;  Location: [facility];  Service: Endoscopy;;  colon  . SHOULDER SURGERY Right   . VASCULAR SURGERY Right 01/02/2013   diamondback orbital rotational  atherectomy, chocolate balloon and IDEV  Stent.    Social History   Socioeconomic History  . Marital status: Single    Spouse name: Not on file  . Number of children: 1  . Years of education: Not on file  . Highest education level: Not on file  Occupational History  . Occupation: disabled    Employer: DISABLED  Tobacco Use  . Smoking status: Current Every Day Smoker    Packs/day: 1.00    Years: 39.00    Pack years: 39.00    Types: Cigarettes  . Smokeless tobacco: Never Used  Vaping Use  . Vaping Use: Never used  Substance and Sexual Activity  . Alcohol use: Yes    Alcohol/week: 6.0 standard drinks    Types: 6 Cans of beer per week  . Drug use: No  . Sexual activity: Not Currently  Other Topics Concern  . Not on file  Social History Narrative   Lives w/ girlfriend   Social Determinants of Health   Financial Resource Strain:   . Difficulty of Paying Living Expenses: Not on file  Food Insecurity:   . Worried About Charity fundraiser in the Last Year: Not on file  . Ran Out of Food in the Last Year: Not on file  Transportation Needs:   . Lack of  Transportation (Medical): Not on file  . Lack of Transportation (Non-Medical): Not on file  Physical Activity:   . Days of Exercise per Week: Not on file  . Minutes of Exercise per Session: Not on file  Stress:   . Feeling of Stress : Not on file  Social Connections:   . Frequency of Communication with Friends and Family: Not on file  . Frequency of Social Gatherings with Friends and Family: Not on file  . Attends Religious Services: Not on file  . Active Member of Clubs or Organizations: Not on file  . Attends Archivist Meetings: Not on file  . Marital Status: Not on file  Intimate Partner Violence:   .  Fear of Current or Ex-Partner: Not on file  . Emotionally Abused: Not on file  . Physically Abused: Not on file  . Sexually Abused: Not on file    Family History  Problem Relation Age of Onset  . Anesthesia problems Neg Hx   . Colon cancer Neg Hx   . Gastric cancer Neg Hx   . Esophageal cancer Neg Hx     Anti-infectives: Anti-infectives (From admission, onward)   None      Current Outpatient Medications  Medication Sig Dispense Refill  . aspirin EC 81 MG EC tablet Take 1 tablet (81 mg total) by mouth daily.    . clopidogrel (PLAVIX) 75 MG tablet Take 1 tablet (75 mg total) by mouth daily with breakfast. 30 tablet 11  . cyclobenzaprine (FLEXERIL) 10 MG tablet Take 1 tablet (10 mg total) by mouth 3 (three) times daily as needed for muscle spasms. 50 tablet 1  . docusate sodium (COLACE) 100 MG capsule Take 1 capsule (100 mg total) by mouth 2 (two) times daily. 60 capsule 0  . empagliflozin (JARDIANCE) 10 MG TABS tablet Take 10 mg by mouth daily. 90 tablet 3  . gabapentin (NEURONTIN) 300 MG capsule Take 1 capsule by mouth daily.    Marland Kitchen HUMALOG MIX 75/25 KWIKPEN (75-25) 100 UNIT/ML Kwikpen Inject 30-60 Units into the skin 2 (two) times daily. Patient takes 30 units in the morning and 60 units at night    . icosapent Ethyl (VASCEPA) 1 g capsule TAKE (2) CAPSULES BY  MOUTH TWICE DAILY. 120 capsule 5  . isosorbide mononitrate (IMDUR) 30 MG 24 hr tablet Take 1 tablet (30 mg total) by mouth daily. 30 tablet 1  . LANTUS SOLOSTAR 100 UNIT/ML Solostar Pen     . losartan (COZAAR) 25 MG tablet TAKE ONE TABLET BY MOUTH ONCE DAILY. 90 tablet 3  . metoprolol succinate (TOPROL-XL) 25 MG 24 hr tablet TAKE 1 TABLET BY MOUTH ONCE A DAY. 90 tablet 2  . MYRBETRIQ 25 MG TB24 tablet Take 25 mg by mouth daily.    Marland Kitchen oxyCODONE-acetaminophen (PERCOCET/ROXICET) 5-325 MG tablet Take 1 tablet by mouth every 6 (six) hours as needed.    Marland Kitchen PROAIR HFA 108 (90 BASE) MCG/ACT inhaler Inhale 2 puffs into the lungs every 6 (six) hours as needed for wheezing or shortness of breath.     . rosuvastatin (CRESTOR) 40 MG tablet Take 1 tablet (40 mg total) by mouth daily. 30 tablet 1  . zolpidem (AMBIEN CR) 6.25 MG CR tablet Take 6.25 mg by mouth at bedtime as needed.     No current facility-administered medications for this visit.     Objective: Vital signs in last 24 hours: BP 129/70   Pulse 84   Temp 98.5 F (36.9 C)   Ht 5\' 11"  (1.803 m)   Wt 178 lb (80.7 kg)   BMI 24.83 kg/m   Intake/Output from previous day: No intake/output data recorded. Intake/Output this shift: @IOTHISSHIFT @   Physical Exam  Lab Results:  Recent Results (from the past 2160 hour(s))  Novel Coronavirus, NAA ([facility])     Status: Abnormal   Collection Time: 05/03/20 12:00 AM   Specimen: Nasopharyngeal(NP) swabs in vial transport medium   Nasopharynge  Screenin  Result Value Ref Range   SARS-CoV-2, NAA Detected (A) Not Detected    Comment: Patients who have a positive COVID-19 test result may now have treatment options. Treatment options are available for patients with mild to moderate symptoms and for hospitalized patients.  Visit our website at [email or web address] for resources and information. This nucleic acid amplification test was developed and its performance characteristics  determined by [employer]. Nucleic acid amplification tests include RT-PCR and TMA. This test has not been FDA cleared or approved. This test has been authorized by FDA under an Emergency Use Authorization (EUA). This test is only authorized for the duration of time the declaration that circumstances exist justifying the authorization of the emergency use of in vitro diagnostic tests for detection of SARS-CoV-2 virus and/or diagnosis of COVID-19 infection under section 564(b)(1) of the Act, 21 U.S.C. 448JEH-6(D) (1), unless the authorization is terminated or revoked sooner. When diagnostic testing is negativ e, the possibility of a false negative result should be considered in the context of a patient's recent exposures and the presence of clinical signs and symptoms consistent with COVID-19. An individual without symptoms of COVID-19 and who is not shedding SARS-CoV-2 virus would expect to have a negative (not detected) result in this assay.   SARS-COV-2, NAA 2 DAY TAT     Status: None   Collection Time: 05/03/20 12:00 AM   Nasopharynge  Screenin  Result Value Ref Range   SARS-CoV-2, NAA 2 DAY TAT Performed   PSA     Status: None   Collection Time: 06/17/20 12:03 PM  Result Value Ref Range   Prostate Specific Ag, Serum 0.3 0.0 - 4.0 ng/mL    Comment: Roche ECLIA methodology. According to the American Urological Association, Serum PSA should decrease and remain at undetectable levels after radical prostatectomy. The AUA defines biochemical recurrence as an initial PSA value 0.2 ng/mL or greater followed by a subsequent confirmatory PSA value 0.2 ng/mL or greater. Values obtained with different assay methods or kits cannot be used interchangeably. Results cannot be interpreted as absolute evidence of the presence or absence of malignant disease.   Urinalysis, Routine w reflex microscopic     Status: Abnormal   Collection Time: 06/24/20  9:57 AM  Result Value Ref Range    Specific Gravity, UA 1.015 1.005 - 1.030   pH, UA 5.0 5.0 - 7.5   Color, UA Yellow Yellow   Appearance Ur Clear Clear   Leukocytes,UA Negative Negative   Protein,UA Negative Negative/Trace   Glucose, UA 3+ (A) Negative   Ketones, UA Negative Negative   RBC, UA Negative Negative   Bilirubin, UA Negative Negative   Urobilinogen, Ur 0.2 0.2 - 1.0 mg/dL   Nitrite, UA Negative Negative   Microscopic Examination Comment     Comment: Microscopic follows if indicated.     Studies/Results: No results found.   Assessment/Plan: Elevated PSA of 12.6 with a benign exam and minimal LUTS.   His biopsy was negative and subsequent PSA's have been very low.   Since he is on chronic oxycodone, I will check a testosterone level to make sure that hypogonadism hasn't depressed the PSA.   He will otherwise return in a year.   Enuresis x 1.  I recommended he try to stop the Myrbetriq and see how he does without it.  He didn't do that the last time I recommended it.   ED with CAD.      No orders of the defined types were placed in this encounter.    Orders Placed This Encounter  Procedures  . Urinalysis, Routine w reflex microscopic  . Testosterone  . PSA, total and free    Standing Status:   Future    Standing Expiration Date:   06/24/2021  Return in about 1 year (around 06/24/2021) for with PSA.    CC: Lucia Gaskins MD.     Irine Seal 06/24/2020 [phone]

## 2020-06-24 ENCOUNTER — Ambulatory Visit (INDEPENDENT_AMBULATORY_CARE_PROVIDER_SITE_OTHER): Payer: Medicare Other | Admitting: Urology

## 2020-06-24 ENCOUNTER — Other Ambulatory Visit: Payer: Self-pay

## 2020-06-24 VITALS — BP 129/70 | HR 84 | Temp 98.5°F | Ht 71.0 in | Wt 178.0 lb

## 2020-06-24 DIAGNOSIS — R972 Elevated prostate specific antigen [PSA]: Secondary | ICD-10-CM

## 2020-06-24 DIAGNOSIS — N400 Enlarged prostate without lower urinary tract symptoms: Secondary | ICD-10-CM

## 2020-06-24 LAB — URINALYSIS, ROUTINE W REFLEX MICROSCOPIC
Bilirubin, UA: NEGATIVE
Ketones, UA: NEGATIVE
Leukocytes,UA: NEGATIVE
Nitrite, UA: NEGATIVE
Protein,UA: NEGATIVE
RBC, UA: NEGATIVE
Specific Gravity, UA: 1.015 (ref 1.005–1.030)
Urobilinogen, Ur: 0.2 mg/dL (ref 0.2–1.0)
pH, UA: 5 (ref 5.0–7.5)

## 2020-06-24 NOTE — Progress Notes (Signed)
Urological Symptom Review  Patient is experiencing the following symptoms: none   Review of Systems  Gastrointestinal (upper)  : Negative for upper GI symptoms  Gastrointestinal (lower) : Negative for lower GI symptoms  Constitutional : Negative for symptoms  Skin: Negative for skin symptoms  Eyes: Negative for eye symptoms  Ear/Nose/Throat : Negative for Ear/Nose/Throat symptoms  Hematologic/Lymphatic: Negative for Hematologic/Lymphatic symptoms  Cardiovascular : Negative for cardiovascular symptoms  Respiratory : Negative for respiratory symptoms  Endocrine: Negative for endocrine symptoms  Musculoskeletal: Back pain  Neurological: Negative for neurological symptoms  Psychologic: Negative for psychiatric symptoms  

## 2020-07-12 DIAGNOSIS — E1165 Type 2 diabetes mellitus with hyperglycemia: Secondary | ICD-10-CM | POA: Diagnosis not present

## 2020-07-12 DIAGNOSIS — K219 Gastro-esophageal reflux disease without esophagitis: Secondary | ICD-10-CM | POA: Diagnosis not present

## 2020-07-12 DIAGNOSIS — E7849 Other hyperlipidemia: Secondary | ICD-10-CM | POA: Diagnosis not present

## 2020-08-02 DIAGNOSIS — E7849 Other hyperlipidemia: Secondary | ICD-10-CM | POA: Diagnosis not present

## 2020-08-02 DIAGNOSIS — E1165 Type 2 diabetes mellitus with hyperglycemia: Secondary | ICD-10-CM | POA: Diagnosis not present

## 2020-08-12 DIAGNOSIS — E1165 Type 2 diabetes mellitus with hyperglycemia: Secondary | ICD-10-CM | POA: Diagnosis not present

## 2020-08-12 DIAGNOSIS — E7849 Other hyperlipidemia: Secondary | ICD-10-CM | POA: Diagnosis not present

## 2020-08-12 DIAGNOSIS — I110 Hypertensive heart disease with heart failure: Secondary | ICD-10-CM | POA: Diagnosis not present

## 2020-09-12 DIAGNOSIS — R5382 Chronic fatigue, unspecified: Secondary | ICD-10-CM | POA: Diagnosis not present

## 2020-09-12 DIAGNOSIS — E1165 Type 2 diabetes mellitus with hyperglycemia: Secondary | ICD-10-CM | POA: Diagnosis not present

## 2020-09-12 DIAGNOSIS — E7849 Other hyperlipidemia: Secondary | ICD-10-CM | POA: Diagnosis not present

## 2020-09-12 DIAGNOSIS — I119 Hypertensive heart disease without heart failure: Secondary | ICD-10-CM | POA: Diagnosis not present

## 2020-10-07 ENCOUNTER — Telehealth: Payer: Self-pay | Admitting: Internal Medicine

## 2020-10-07 NOTE — Telephone Encounter (Signed)
Pt called in and stated that he is having some side pain.  Both sides and upper back .  He stated they are sharp pains that takes his breath.  He stated they happen like every couple of hours.  He stated this started last Friday .    Pt denies any other symptoms.  No chest pains ,   He has called pcp 3 times and stated they have not call him back   Best number [phone]

## 2020-10-07 NOTE — Telephone Encounter (Signed)
Agree - would recommend he go to urgent care for testing - not likely we can get him in today and we are heading into the weekend.  Dr Lemmie Evens

## 2020-10-07 NOTE — Telephone Encounter (Signed)
Pt report since last Friday, he's been experiencing on and of back and bilateral side pain. He doesn't describe any precipitating or alleviating factor, but state he can just be sitting in his recliner and the sharp pain will shoot up either side. He denies chest pain, SOB, or any other symptoms but state symptoms are similar to when he had pneumonia a few years ago.   Pt state he has called his pcp 3 times but have not received a call back. Nurse advised pt to go to urgent care if he doesn't hear back from pcp, but will route to Dr. Debara Pickett for any further recommendations.

## 2020-10-07 NOTE — Telephone Encounter (Signed)
Pt updated and verbalized understanding.  

## 2020-10-20 ENCOUNTER — Other Ambulatory Visit (HOSPITAL_COMMUNITY): Payer: Self-pay | Admitting: Student

## 2020-10-20 ENCOUNTER — Other Ambulatory Visit: Payer: Self-pay | Admitting: Student

## 2020-10-20 DIAGNOSIS — M5416 Radiculopathy, lumbar region: Secondary | ICD-10-CM | POA: Diagnosis not present

## 2020-10-20 DIAGNOSIS — M48062 Spinal stenosis, lumbar region with neurogenic claudication: Secondary | ICD-10-CM | POA: Diagnosis not present

## 2020-11-01 ENCOUNTER — Ambulatory Visit (HOSPITAL_COMMUNITY)
Admission: RE | Admit: 2020-11-01 | Discharge: 2020-11-01 | Disposition: A | Discharge status: Home / Self Care | Payer: Medicare Other | Source: Ambulatory Visit | Attending: Student | Admitting: Student

## 2020-11-01 DIAGNOSIS — M5416 Radiculopathy, lumbar region: Secondary | ICD-10-CM | POA: Diagnosis not present

## 2020-11-01 DIAGNOSIS — M545 Low back pain, unspecified: Secondary | ICD-10-CM | POA: Diagnosis not present

## 2020-11-01 MED ORDER — GADOBUTROL 1 MMOL/ML IV SOLN
7.0000 mL | Freq: Once | INTRAVENOUS | Status: AC | PRN
  Administered 2020-11-01: 7 mL via INTRAVENOUS

## 2020-11-17 DIAGNOSIS — E1165 Type 2 diabetes mellitus with hyperglycemia: Secondary | ICD-10-CM | POA: Diagnosis not present

## 2020-11-17 DIAGNOSIS — E7849 Other hyperlipidemia: Secondary | ICD-10-CM | POA: Diagnosis not present

## 2020-11-21 DIAGNOSIS — G8929 Other chronic pain: Secondary | ICD-10-CM | POA: Diagnosis not present

## 2020-11-21 DIAGNOSIS — K219 Gastro-esophageal reflux disease without esophagitis: Secondary | ICD-10-CM | POA: Diagnosis not present

## 2020-11-21 DIAGNOSIS — E1165 Type 2 diabetes mellitus with hyperglycemia: Secondary | ICD-10-CM | POA: Diagnosis not present

## 2020-11-21 DIAGNOSIS — E7849 Other hyperlipidemia: Secondary | ICD-10-CM | POA: Diagnosis not present

## 2020-12-06 DIAGNOSIS — E7849 Other hyperlipidemia: Secondary | ICD-10-CM | POA: Diagnosis not present

## 2020-12-06 DIAGNOSIS — I110 Hypertensive heart disease with heart failure: Secondary | ICD-10-CM | POA: Diagnosis not present

## 2020-12-06 DIAGNOSIS — E1165 Type 2 diabetes mellitus with hyperglycemia: Secondary | ICD-10-CM | POA: Diagnosis not present

## 2020-12-16 ENCOUNTER — Ambulatory Visit
Admission: EM | Admit: 2020-12-16 | Discharge: 2020-12-16 | Disposition: A | Discharge status: Home / Self Care | Payer: Medicare Other | Attending: Emergency Medicine | Admitting: Emergency Medicine

## 2020-12-16 ENCOUNTER — Ambulatory Visit (INDEPENDENT_AMBULATORY_CARE_PROVIDER_SITE_OTHER): Payer: Medicare Other

## 2020-12-16 ENCOUNTER — Other Ambulatory Visit: Payer: Self-pay

## 2020-12-16 ENCOUNTER — Encounter: Payer: Self-pay | Admitting: Emergency Medicine

## 2020-12-16 DIAGNOSIS — R109 Unspecified abdominal pain: Secondary | ICD-10-CM

## 2020-12-16 DIAGNOSIS — R0781 Pleurodynia: Secondary | ICD-10-CM

## 2020-12-16 DIAGNOSIS — S0990XA Unspecified injury of head, initial encounter: Secondary | ICD-10-CM

## 2020-12-16 DIAGNOSIS — W19XXXA Unspecified fall, initial encounter: Secondary | ICD-10-CM

## 2020-12-16 DIAGNOSIS — R079 Chest pain, unspecified: Secondary | ICD-10-CM | POA: Diagnosis not present

## 2020-12-16 DIAGNOSIS — R55 Syncope and collapse: Secondary | ICD-10-CM

## 2020-12-16 DIAGNOSIS — Z043 Encounter for examination and observation following other accident: Secondary | ICD-10-CM | POA: Diagnosis not present

## 2020-12-16 DIAGNOSIS — M47814 Spondylosis without myelopathy or radiculopathy, thoracic region: Secondary | ICD-10-CM | POA: Diagnosis not present

## 2020-12-16 MED ORDER — METHYLPREDNISOLONE SODIUM SUCC 125 MG IJ SOLR
80.0000 mg | Freq: Once | INTRAMUSCULAR | Status: AC
  Administered 2020-12-16: 80 mg via INTRAMUSCULAR

## 2020-12-16 MED ORDER — PREDNISONE 20 MG PO TABS
20.0000 mg | ORAL_TABLET | Freq: Two times a day (BID) | ORAL | 0 refills | Status: DC
Start: 2020-12-16 — End: 2020-12-16

## 2020-12-16 NOTE — Discharge Instructions (Addendum)
Unable to rule out bleed in brain or internal bleeding in urgent care setting.  Offered patient further evaluation and management in the ED.  Patient declines at this time and would like to try outpatient therapy first.  Aware of the risk associated with this decision including missed diagnosis, organ damage, organ failure, and/or death.  Patient aware and in agreement.     X-ray negative for cardiopulmonary disease Continue conservative management of rest, ice, and gentle stretches Prednisone prescribed for pain and inflammation Follow up with PCP if symptoms persist Call 911 or go to the ER if you have any new or worsening symptoms (fever, chills, chest pain, shortness of breath, abdominal pain, passing out, slurred speech, facial droop, weakness, etc...)

## 2020-12-16 NOTE — ED Provider Notes (Addendum)
[place]   [id number] 12/16/20 Arrival Time: 47  CC: LT sided rib and side PAIN  SUBJECTIVE: History from: patient. [name] is a 60 y.o. male complains of LT sided rib and side pain x 1 day.  States he "blacked out" and fell after standing from toilet.  Localizes the pain to the LT rib and side.  Describes as intermittent and achy.  Denies alleviating factors.  Symptoms are made worse with movement.  Denies similar symptoms in the past.  Denies fever, chills, headache, vision changes, slurred speech, facial droop, erythema, ecchymosis, effusion, weakness.  Also reports hitting head, but not concerned for head trauma.  Also reports another episode of "blacking out."  Has not been evaluated for fainting/ syncopal events.    ROS: As per HPI.  All other pertinent ROS negative.     Past Medical History:  Diagnosis Date  . Arthritis    "some; in hips sometimes" (01/02/2013)  . Back pain    "w/prolonged walking or standing" (01/02/2013)  . CAD (coronary artery disease)    CABG X 4 08/2012  . COPD (chronic obstructive pulmonary disease) ([place])   . Elevated PSA   . Esophageal erosions 05/24/01   egd by Dr. Gala Romney  . GERD (gastroesophageal reflux disease)   . H/O hiatal hernia   . History of gout   . Hyperlipidemia   . Hypertension    not on any medication for htn at present  . PAD (peripheral artery disease) ([place])    LEA DOPPLER, 11/11/2012 - moderate arterial insufficiency to lower extremities at rest, BILATERAL SFA-demonstrates occlusive disease with reconstitution of flow  . Tobacco abuse   . Tubular adenoma of colon 08/28/11  . Type II diabetes mellitus ([place])   . Unintentional weight loss    Past Surgical History:  Procedure Laterality Date  . ANTERIOR CERVICAL DECOMP/DISCECTOMY FUSION  12/19/2011   Procedure: ANTERIOR CERVICAL DECOMPRESSION/DISCECTOMY FUSION 2 LEVELS;  Surgeon: Floyce Stakes, MD;  Location: [facility];  Service: Neurosurgery;  Laterality: N/A;   Cervical four-five Cervical five-six  Anterior cervical decompression/diskectomy, fusion, plate  . ATHERECTOMY N/A 01/02/2013   Procedure: ATHERECTOMY;  Surgeon: Lorretta Harp, MD;  Location: [facility] CATH LAB;  Service: Cardiovascular;  Laterality: N/A;  . BACK SURGERY     x3  . CARDIAC CATHETERIZATION  08/19/2012   CABG warranted; Severe ostial LCx stenosis with mid to distal vessel occlusion  . COLONOSCOPY W/ POLYPECTOMY  08/28/11   Rourk-tubular adenomas removed from ascending colon, suboptimal prep, diminutive rectal polyps  . COLONOSCOPY WITH PROPOFOL N/A 06/13/2017   Procedure: COLONOSCOPY WITH PROPOFOL;  Surgeon: Daneil Dolin, MD;  Location: [facility];  Service: Endoscopy;  Laterality: N/A;  9:15am  . CORONARY ARTERY BYPASS GRAFT  08/22/2012   Procedure: CORONARY ARTERY BYPASS GRAFTING (CABG);  Surgeon: Grace Isaac, MD;  Location: [place];  Service: Open Heart Surgery;  Laterality: N/A;  CABG x four,  using left internal mammary artery and right leg greater saphenous vein harvested endoscopically  . ESOPHAGOGASTRODUODENOSCOPY  05/24/01   by Dr. Gala Romney  . ESOPHAGOGASTRODUODENOSCOPY  08/28/11   Rourk-erosive reflux esophagitis, gastric erosions  . ESOPHAGOGASTRODUODENOSCOPY (EGD) WITH PROPOFOL N/A 06/13/2017   Procedure: ESOPHAGOGASTRODUODENOSCOPY (EGD) WITH PROPOFOL;  Surgeon: Daneil Dolin, MD;  Location: [facility];  Service: Endoscopy;  Laterality: N/A;  . INTRAOPERATIVE TRANSESOPHAGEAL ECHOCARDIOGRAM  08/22/2012   Procedure: INTRAOPERATIVE TRANSESOPHAGEAL ECHOCARDIOGRAM;  Surgeon: Grace Isaac, MD;  Location: [place];  Service:  Open Heart Surgery;  Laterality: N/A;  . LEFT HEART CATHETERIZATION WITH CORONARY ANGIOGRAM N/A 08/19/2012   Procedure: LEFT HEART CATHETERIZATION WITH CORONARY ANGIOGRAM;  Surgeon: [name], MD;  Location: [facility] CATH LAB;  Service: Cardiovascular;  Laterality: N/A;  . LOWER EXTREMITY ANGIOGRAM N/A 11/17/2012   Procedure: LOWER EXTREMITY ANGIOGRAM;   Surgeon: Lorretta Harp, MD;  Location: [facility] CATH LAB;  Service: Cardiovascular;  Laterality: N/A;  . LOWER EXTREMITY ANGIOGRAM N/A 02/02/2013   Procedure: LOWER EXTREMITY ANGIOGRAM;  Surgeon: Lorretta Harp, MD;  Location: [facility] CATH LAB;  Service: Cardiovascular;  Laterality: N/A;  . LUMBAR [place]    . LUMBAR FUSION  ~ 2011  . PELVIC ABCESS DRAINAGE     x2  . POLYPECTOMY  06/13/2017   Procedure: POLYPECTOMY;  Surgeon: Daneil Dolin, MD;  Location: [facility];  Service: Endoscopy;;  colon  . SHOULDER SURGERY Right   . VASCULAR SURGERY Right 01/02/2013   diamondback orbital rotational  atherectomy, chocolate balloon and IDEV  Stent.   No Known Allergies No current facility-administered medications on file prior to encounter.   Current Outpatient Medications on File Prior to Encounter  Medication Sig Dispense Refill  . aspirin EC 81 MG EC tablet Take 1 tablet (81 mg total) by mouth daily.    . clopidogrel (PLAVIX) 75 MG tablet Take 1 tablet (75 mg total) by mouth daily with breakfast. 30 tablet 11  . cyclobenzaprine (FLEXERIL) 10 MG tablet Take 1 tablet (10 mg total) by mouth 3 (three) times daily as needed for muscle spasms. 50 tablet 1  . docusate sodium (COLACE) 100 MG capsule Take 1 capsule (100 mg total) by mouth 2 (two) times daily. 60 capsule 0  . empagliflozin (JARDIANCE) 10 MG TABS tablet Take 10 mg by mouth daily. 90 tablet 3  . gabapentin (NEURONTIN) 300 MG capsule Take 1 capsule by mouth daily.    Marland Kitchen HUMALOG MIX 75/25 KWIKPEN (75-25) 100 UNIT/ML Kwikpen Inject 30-60 Units into the skin 2 (two) times daily. Patient takes 30 units in the morning and 60 units at night    . icosapent Ethyl (VASCEPA) 1 g capsule TAKE (2) CAPSULES BY MOUTH TWICE DAILY. 120 capsule 5  . isosorbide mononitrate (IMDUR) 30 MG 24 hr tablet Take 1 tablet (30 mg total) by mouth daily. 30 tablet 1  . LANTUS SOLOSTAR 100 UNIT/ML Solostar Pen     . losartan (COZAAR) 25 MG tablet TAKE ONE TABLET BY MOUTH  ONCE DAILY. 90 tablet 3  . metoprolol succinate (TOPROL-XL) 25 MG 24 hr tablet TAKE 1 TABLET BY MOUTH ONCE A DAY. 90 tablet 2  . MYRBETRIQ 25 MG TB24 tablet Take 25 mg by mouth daily.    Marland Kitchen oxyCODONE-acetaminophen (PERCOCET/ROXICET) 5-325 MG tablet Take 1 tablet by mouth every 6 (six) hours as needed.    Marland Kitchen PROAIR HFA 108 (90 BASE) MCG/ACT inhaler Inhale 2 puffs into the lungs every 6 (six) hours as needed for wheezing or shortness of breath.     . rosuvastatin (CRESTOR) 40 MG tablet Take 1 tablet (40 mg total) by mouth daily. 30 tablet 1  . zolpidem (AMBIEN CR) 6.25 MG CR tablet Take 6.25 mg by mouth at bedtime as needed.     Social History   Socioeconomic History  . Marital status: Single    Spouse name: Not on file  . Number of children: 1  . Years of education: Not on file  . Highest education level: Not on file  Occupational History  . Occupation: disabled    Employer: DISABLED  Tobacco Use  . Smoking status: Current Every Day Smoker    Packs/day: 1.00    Years: 39.00    Pack years: 39.00    Types: Cigarettes  . Smokeless tobacco: Never Used  Vaping Use  . Vaping Use: Never used  Substance and Sexual Activity  . Alcohol use: Yes    Alcohol/week: 6.0 standard drinks    Types: 6 Cans of beer per week  . Drug use: No  . Sexual activity: Not Currently  Other Topics Concern  . Not on file  Social History Narrative   Lives w/ girlfriend   Social Determinants of Health   Financial Resource Strain: Not on file  Food Insecurity: Not on file  Transportation Needs: Not on file  Physical Activity: Not on file  Stress: Not on file  Social Connections: Not on file  Intimate Partner Violence: Not on file   Family History  Problem Relation Age of Onset  . Anesthesia problems Neg Hx   . Colon cancer Neg Hx   . Gastric cancer Neg Hx   . Esophageal cancer Neg Hx     OBJECTIVE:  Vitals:   12/16/20 1606  BP: (!) 148/72  Pulse: 73  Resp: 17  Temp: 98.5 F (36.9 C)   TempSrc: Oral  SpO2: 91%    General appearance: ALERT; in no acute distress.  Head: NCAT ENT: EACs clear, TMs pearly gray; nares patent; oropharynx clear Lungs: Normal respiratory effort; diffuse wheezing CV: RRR Musculoskeletal: Chest Inspection: ecchymosis to LT lateral ribs and abdomen Palpation: TTP over LT lateral ribs and abdomen Skin: warm and dry Neurologic: Ambulates without difficulty; strength intact about the UE and LE; finger to nose without difficulty; CN 2-12 grossly intact Psychological: alert and cooperative; normal mood and affect  DIAGNOSTIC STUDIES:  DG Chest 2 View  Result Date: 12/16/2020 CLINICAL DATA:  Fall EXAM: CHEST - 2 VIEW COMPARISON:  September 10, 2018 FINDINGS: The cardiomediastinal silhouette is unchanged in contour when allowing for differences in patient rotation.Status post median sternotomy and CABG. No pleural effusion. No pneumothorax. No acute pleuroparenchymal abnormality. Visualized abdomen is unremarkable. Multilevel degenerative changes of the thoracic spine. No acute displaced rib fracture visualized subjacent to the BB marking site of pain. Status post ACDF atherosclerotic calcifications. IMPRESSION: No acute cardiopulmonary abnormality. Electronically Signed   By: Valentino Saxon MD   On: 12/16/2020 16:33     ASSESSMENT & PLAN:  1. Rib pain on left side   2. Side pain   3. Fall, initial encounter   4. Traumatic injury of head, initial encounter   5. Syncope, unspecified syncope type     Meds ordered this encounter  Medications  . predniSONE (DELTASONE) 20 MG tablet    Sig: Take 1 tablet (20 mg total) by mouth 2 (two) times daily with a meal for 5 days.    Dispense:  10 tablet    Refill:  0    Order Specific Question:   Supervising Provider    Answer:   Raylene Everts [[id number]]   Unable to rule out bleed in brain or internal bleeding in urgent care setting.  I am very concerned for patient given multiple syncopal episodes.   Offered patient further evaluation and management in the ED.  Patient declines at this time and would like to try outpatient therapy first.  Aware of the risk associated with this decision including missed diagnosis, organ damage, organ  failure, and/or death.  Patient aware and in agreement.     X-ray negative for cardiopulmonary disease Continue conservative management of rest, ice, and gentle stretches Prednisone prescribed for pain and inflammation Follow up with PCP if symptoms persist Call 911 or go to the ER if you have any new or worsening symptoms (fever, chills, chest pain, shortness of breath, abdominal pain, passing out, slurred speech, facial droop, weakness, etc...)    Reviewed expectations re: course of current medical issues. Questions answered. Outlined signs and symptoms indicating need for more acute intervention. Patient verbalized understanding. After Visit Summary given.    Lestine Box, PA-C 12/16/20 1646   ADDENDUM: Declines steroid pill, would like shot   Lestine Box, PA-C 12/16/20 1656

## 2020-12-16 NOTE — ED Triage Notes (Signed)
Pt fell off toilet yesterday, pain and bruising to LT lower rib area.

## 2020-12-18 ENCOUNTER — Encounter (HOSPITAL_COMMUNITY): Payer: Self-pay | Admitting: Emergency Medicine

## 2020-12-18 ENCOUNTER — Other Ambulatory Visit: Payer: Self-pay

## 2020-12-18 ENCOUNTER — Emergency Department (HOSPITAL_COMMUNITY)
Admission: EM | Admit: 2020-12-18 | Discharge: 2020-12-19 | Disposition: A | Discharge status: Home / Self Care | Payer: Medicare Other | Attending: Emergency Medicine | Admitting: Emergency Medicine

## 2020-12-18 ENCOUNTER — Emergency Department (HOSPITAL_COMMUNITY): Payer: Medicare Other

## 2020-12-18 DIAGNOSIS — W1811XA Fall from or off toilet without subsequent striking against object, initial encounter: Secondary | ICD-10-CM | POA: Insufficient documentation

## 2020-12-18 DIAGNOSIS — I10 Essential (primary) hypertension: Secondary | ICD-10-CM | POA: Diagnosis not present

## 2020-12-18 DIAGNOSIS — Z7902 Long term (current) use of antithrombotics/antiplatelets: Secondary | ICD-10-CM | POA: Diagnosis not present

## 2020-12-18 DIAGNOSIS — E119 Type 2 diabetes mellitus without complications: Secondary | ICD-10-CM | POA: Insufficient documentation

## 2020-12-18 DIAGNOSIS — F17210 Nicotine dependence, cigarettes, uncomplicated: Secondary | ICD-10-CM | POA: Diagnosis not present

## 2020-12-18 DIAGNOSIS — R55 Syncope and collapse: Secondary | ICD-10-CM | POA: Diagnosis not present

## 2020-12-18 DIAGNOSIS — S301XXA Contusion of abdominal wall, initial encounter: Secondary | ICD-10-CM | POA: Diagnosis not present

## 2020-12-18 DIAGNOSIS — H6123 Impacted cerumen, bilateral: Secondary | ICD-10-CM | POA: Diagnosis not present

## 2020-12-18 DIAGNOSIS — Z7982 Long term (current) use of aspirin: Secondary | ICD-10-CM | POA: Diagnosis not present

## 2020-12-18 DIAGNOSIS — Z794 Long term (current) use of insulin: Secondary | ICD-10-CM | POA: Diagnosis not present

## 2020-12-18 DIAGNOSIS — S0990XA Unspecified injury of head, initial encounter: Secondary | ICD-10-CM | POA: Diagnosis not present

## 2020-12-18 DIAGNOSIS — R0789 Other chest pain: Secondary | ICD-10-CM | POA: Insufficient documentation

## 2020-12-18 DIAGNOSIS — W19XXXA Unspecified fall, initial encounter: Secondary | ICD-10-CM

## 2020-12-18 DIAGNOSIS — I2510 Atherosclerotic heart disease of native coronary artery without angina pectoris: Secondary | ICD-10-CM | POA: Insufficient documentation

## 2020-12-18 DIAGNOSIS — J449 Chronic obstructive pulmonary disease, unspecified: Secondary | ICD-10-CM | POA: Insufficient documentation

## 2020-12-18 DIAGNOSIS — R109 Unspecified abdominal pain: Secondary | ICD-10-CM | POA: Diagnosis not present

## 2020-12-18 DIAGNOSIS — S2242XA Multiple fractures of ribs, left side, initial encounter for closed fracture: Secondary | ICD-10-CM | POA: Diagnosis not present

## 2020-12-18 DIAGNOSIS — I6523 Occlusion and stenosis of bilateral carotid arteries: Secondary | ICD-10-CM | POA: Diagnosis not present

## 2020-12-18 DIAGNOSIS — Z79899 Other long term (current) drug therapy: Secondary | ICD-10-CM | POA: Diagnosis not present

## 2020-12-18 DIAGNOSIS — I672 Cerebral atherosclerosis: Secondary | ICD-10-CM | POA: Diagnosis not present

## 2020-12-18 LAB — CBC WITH DIFFERENTIAL/PLATELET
Abs Immature Granulocytes: 0.03 10*3/uL (ref 0.00–0.07)
Basophils Absolute: 0.1 10*3/uL (ref 0.0–0.1)
Basophils Relative: 1 %
Eosinophils Absolute: 0.1 10*3/uL (ref 0.0–0.5)
Eosinophils Relative: 2 %
HCT: 43.8 % (ref 39.0–52.0)
Hemoglobin: 14.6 g/dL (ref 13.0–17.0)
Immature Granulocytes: 0 %
Lymphocytes Relative: 19 %
Lymphs Abs: 1.7 10*3/uL (ref 0.7–4.0)
MCH: 31.4 pg (ref 26.0–34.0)
MCHC: 33.3 g/dL (ref 30.0–36.0)
MCV: 94.2 fL (ref 80.0–100.0)
Monocytes Absolute: 0.6 10*3/uL (ref 0.1–1.0)
Monocytes Relative: 7 %
Neutro Abs: 6.2 10*3/uL (ref 1.7–7.7)
Neutrophils Relative %: 71 %
Platelets: 163 10*3/uL (ref 150–400)
RBC: 4.65 MIL/uL (ref 4.22–5.81)
RDW: 12.9 % (ref 11.5–15.5)
WBC: 8.7 10*3/uL (ref 4.0–10.5)
nRBC: 0 % (ref 0.0–0.2)

## 2020-12-18 LAB — COMPREHENSIVE METABOLIC PANEL
ALT: 19 U/L (ref 0–44)
AST: 18 U/L (ref 15–41)
Albumin: 3.7 g/dL (ref 3.5–5.0)
Alkaline Phosphatase: 99 U/L (ref 38–126)
Anion gap: 9 (ref 5–15)
BUN: 19 mg/dL (ref 6–20)
CO2: 26 mmol/L (ref 22–32)
Calcium: 8.9 mg/dL (ref 8.9–10.3)
Chloride: 100 mmol/L (ref 98–111)
Creatinine, Ser: 0.77 mg/dL (ref 0.61–1.24)
GFR, Estimated: >60 mL/min (ref >60)
Glucose, Bld: 202 mg/dL — ABNORMAL HIGH (ref 70–99)
Potassium: 3.6 mmol/L (ref 3.5–5.1)
Sodium: 135 mmol/L (ref 135–145)
Total Bilirubin: 0.4 mg/dL (ref 0.3–1.2)
Total Protein: 7.6 g/dL (ref 6.5–8.1)

## 2020-12-18 MED ORDER — OXYCODONE-ACETAMINOPHEN 5-325 MG PO TABS
1.0000 | ORAL_TABLET | Freq: Once | ORAL | Status: AC
  Administered 2020-12-18: 1 via ORAL
  Filled 2020-12-18: qty 1

## 2020-12-18 NOTE — ED Triage Notes (Signed)
Pt fell off of his toilet on 12/15/20, was seen at [facility] on 12/16/20. Pt c/o worsening left side pain.

## 2020-12-18 NOTE — Discharge Instructions (Addendum)
CT RESULTS: 1. Minimally displaced left ninth lateral rib fracture and left posterior eleventh and twelfth rib fractures. No pneumothorax. 2. Cavitary 6 mm left upper lobe pulmonary nodule. Non-contrast chest CT at 6-12 months is recommended. If the nodule is stable at time of repeat CT, then future CT at 18-24 months (from today's scan) is considered optional for low-risk patients, but is recommended for high-risk patients. This recommendation follows the consensus statement: Guidelines for Management of Incidental Pulmonary Nodules Detected on CT Images: From the Fleischner Society 2017; Radiology 2017; 284:228-243.  You may continue your regular pain meds for your rib pain  Use the incentive spirometer 10 times per hour, every hour that you are awake.   Please follow up with your primary care provider within 5-7 days for re-evaluation of your symptoms. If you do not have a primary care provider, information for a healthcare clinic has been provided for you to make arrangements for follow up care. Please return to the emergency department for any new or worsening symptoms.

## 2020-12-18 NOTE — ED Notes (Signed)
Pt to ct 

## 2020-12-18 NOTE — ED Provider Notes (Signed)
[facility] EMERGENCY DEPARTMENT Provider Note   CSN: [id number] Arrival date & time: 12/18/20  2001     History Chief Complaint  Patient presents with  . Fall    [name] is a 60 y.o. male.  HPI   60 year old male with history of arthritis, CAD, COPD, GERD, upper lipidemia, hypertension, tobacco abuse, type 2 diabetes, who presents to the emergency department today for evaluation after a fall.  Patient states that 4 days ago he was sitting on the toilet, had a bowel movement and then when he got up to wash his hands he had a syncopal episode.  He does not remember the episode.  He does report that he hit his head.  Since then he has had no episodes of vomiting.  He is complaining of pain to the left chest wall and left lower abdomen.  He was seen at urgent care and had a negative x-ray.  He was advised to go to the ED at that time however he refused.  Since then he has developed a large area of bruising to the left abdomen.  He also states that his pain has not been under control.  He denies any shortness of breath but he has had a chronic cough which is unchanged.  Past Medical History:  Diagnosis Date  . Arthritis    "some; in hips sometimes" (01/02/2013)  . Back pain    "w/prolonged walking or standing" (01/02/2013)  . CAD (coronary artery disease)    CABG X 4 08/2012  . COPD (chronic obstructive pulmonary disease) ([place])   . Elevated PSA   . Esophageal erosions 05/24/01   egd by Dr. Gala Romney  . GERD (gastroesophageal reflux disease)   . H/O hiatal hernia   . History of gout   . Hyperlipidemia   . Hypertension    not on any medication for htn at present  . PAD (peripheral artery disease) ([place])    LEA DOPPLER, 11/11/2012 - moderate arterial insufficiency to lower extremities at rest, BILATERAL SFA-demonstrates occlusive disease with reconstitution of flow  . Tobacco abuse   . Tubular adenoma of colon 08/28/11  . Type II diabetes mellitus ([place])   . Unintentional weight loss      Patient Active Problem List   Diagnosis Date Noted  . Elevated PSA 12/25/2019  . Chest pain 09/10/2018  . Lumbar stenosis with neurogenic claudication 10/30/2017  . Preoperative cardiovascular examination 10/11/2017  . Abdominal pain 05/16/2017  . Mixed hyperlipidemia 10/04/2016  . Back pain 05/04/2015  . Leg edema 08/22/2013  . Claudication ([place]) 02/03/2013  . 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 PAD (peripheral artery disease), [place] crossing of mid rt SFA, with diamond back athrectomy and debulking, chocolate balloon and stent with IDEV.  Unsucessful revascularization Lt SFA  08/23/2012  . Scrotal abscess 08/19/2012  . Coronary atherosclerosis of native coronary artery 08/19/2012  . Dysphagia 07/20/2012  . Unintentional weight loss 01/17/2012  . Diabetes mellitus, insulin dependent (IDDM), uncontrolled ([place]) 01/17/2012  . Essential hypertension 01/17/2012  . Tobacco abuse 01/17/2012  . Hepatomegaly 01/17/2012  . Hx of adenomatous colonic polyps 01/17/2012    Past Surgical History:  Procedure Laterality Date  . ANTERIOR CERVICAL DECOMP/DISCECTOMY FUSION  12/19/2011   Procedure: ANTERIOR CERVICAL DECOMPRESSION/DISCECTOMY FUSION 2 LEVELS;  Surgeon: Floyce Stakes, MD;  Location: [facility] NEURO ORS;  Service: Neurosurgery;  Laterality: N/A;  Cervical four-five Cervical five-six  Anterior cervical decompression/diskectomy, fusion, plate  . ATHERECTOMY N/A 01/02/2013   Procedure: ATHERECTOMY;  Surgeon: Lorretta Harp, MD;  Location: [facility] CATH LAB;  Service: Cardiovascular;  Laterality: N/A;  . BACK SURGERY     x3  . CARDIAC CATHETERIZATION  08/19/2012   CABG warranted; Severe ostial LCx stenosis with mid to distal vessel occlusion  . COLONOSCOPY W/ POLYPECTOMY  08/28/11   Rourk-tubular adenomas removed from ascending colon, suboptimal prep, diminutive rectal polyps  . COLONOSCOPY WITH PROPOFOL N/A 06/13/2017   Procedure: COLONOSCOPY WITH PROPOFOL;  Surgeon: Daneil Dolin, MD;  Location: [facility]  ENDO SUITE;  Service: Endoscopy;  Laterality: N/A;  9:15am  . CORONARY ARTERY BYPASS GRAFT  08/22/2012   Procedure: CORONARY ARTERY BYPASS GRAFTING (CABG);  Surgeon: Grace Isaac, MD;  Location: [place];  Service: Open Heart Surgery;  Laterality: N/A;  CABG x four,  using left internal mammary artery and right leg greater saphenous vein harvested endoscopically  . ESOPHAGOGASTRODUODENOSCOPY  05/24/01   by Dr. Gala Romney  . ESOPHAGOGASTRODUODENOSCOPY  08/28/11   Rourk-erosive reflux esophagitis, gastric erosions  . ESOPHAGOGASTRODUODENOSCOPY (EGD) WITH PROPOFOL N/A 06/13/2017   Procedure: ESOPHAGOGASTRODUODENOSCOPY (EGD) WITH PROPOFOL;  Surgeon: Daneil Dolin, MD;  Location: [facility] ENDO SUITE;  Service: Endoscopy;  Laterality: N/A;  . INTRAOPERATIVE TRANSESOPHAGEAL ECHOCARDIOGRAM  08/22/2012   Procedure: INTRAOPERATIVE TRANSESOPHAGEAL ECHOCARDIOGRAM;  Surgeon: Grace Isaac, MD;  Location: [place];  Service: Open Heart Surgery;  Laterality: N/A;  . LEFT HEART CATHETERIZATION WITH CORONARY ANGIOGRAM N/A 08/19/2012   Procedure: LEFT HEART CATHETERIZATION WITH CORONARY ANGIOGRAM;  Surgeon: [name], MD;  Location: [facility] CATH LAB;  Service: Cardiovascular;  Laterality: N/A;  . LOWER EXTREMITY ANGIOGRAM N/A 11/17/2012   Procedure: LOWER EXTREMITY ANGIOGRAM;  Surgeon: Lorretta Harp, MD;  Location: [facility] CATH LAB;  Service: Cardiovascular;  Laterality: N/A;  . LOWER EXTREMITY ANGIOGRAM N/A 02/02/2013   Procedure: LOWER EXTREMITY ANGIOGRAM;  Surgeon: Lorretta Harp, MD;  Location: [facility] CATH LAB;  Service: Cardiovascular;  Laterality: N/A;  . LUMBAR [place]    . LUMBAR FUSION  ~ 2011  . PELVIC ABCESS DRAINAGE     x2  . POLYPECTOMY  06/13/2017   Procedure: POLYPECTOMY;  Surgeon: Daneil Dolin, MD;  Location: [facility] ENDO [facility];  Service: Endoscopy;;  colon  . SHOULDER SURGERY Right   . VASCULAR SURGERY Right 01/02/2013   diamondback orbital rotational  atherectomy, chocolate balloon and IDEV  Stent.        Family History  Problem Relation Age of Onset  . Anesthesia problems Neg Hx   . Colon cancer Neg Hx   . Gastric cancer Neg Hx   . Esophageal cancer Neg Hx     Social History   Tobacco Use  . Smoking status: Current Every Day Smoker    Packs/day: 1.00    Years: 39.00    Pack years: 39.00    Types: Cigarettes  . Smokeless tobacco: Never Used  Vaping Use  . Vaping Use: Never used  Substance Use Topics  . Alcohol use: Yes    Alcohol/week: 6.0 standard drinks    Types: 6 Cans of beer per week  . Drug use: No    Home Medications Prior to Admission medications   Medication Sig Start Date End Date Taking? Authorizing Provider  aspirin EC 81 MG EC tablet Take 1 tablet (81 mg total) by mouth daily. 09/11/18   Orson Eva, MD  clopidogrel (PLAVIX) 75 MG tablet Take 1 tablet (75 mg total) by mouth daily with breakfast. 05/03/15   Hilty, Nadean Corwin, MD  cyclobenzaprine (  FLEXERIL) 10 MG tablet Take 1 tablet (10 mg total) by mouth 3 (three) times daily as needed for muscle spasms. 10/31/17   Newman Pies, MD  docusate sodium (COLACE) 100 MG capsule Take 1 capsule (100 mg total) by mouth 2 (two) times daily. 09/10/18   Orson Eva, MD  empagliflozin (JARDIANCE) 10 MG TABS tablet Take 10 mg by mouth daily. 10/06/18   Hilty, Nadean Corwin, MD  gabapentin (NEURONTIN) 300 MG capsule Take 1 capsule by mouth daily. 08/19/18   [provider]  HUMALOG MIX 75/25 KWIKPEN (75-25) 100 UNIT/ML Kwikpen Inject 30-60 Units into the skin 2 (two) times daily. Patient takes 30 units in the morning and 60 units at night 09/07/18   [provider]  icosapent Ethyl (VASCEPA) 1 g capsule TAKE (2) CAPSULES BY MOUTH TWICE DAILY. 11/06/19   Hilty, Nadean Corwin, MD  isosorbide mononitrate (IMDUR) 30 MG 24 hr tablet Take 1 tablet (30 mg total) by mouth daily. 09/11/18   Orson Eva, MD  LANTUS SOLOSTAR 100 UNIT/ML Solostar Pen  09/08/19   [provider]  losartan (COZAAR) 25 MG tablet TAKE ONE TABLET BY  MOUTH ONCE DAILY. 04/26/20   Hilty, Nadean Corwin, MD  metoprolol succinate (TOPROL-XL) 25 MG 24 hr tablet TAKE 1 TABLET BY MOUTH ONCE A DAY. 02/24/20   Hilty, Nadean Corwin, MD  MYRBETRIQ 25 MG TB24 tablet Take 25 mg by mouth daily. 10/27/19   [provider]  oxyCODONE-acetaminophen (PERCOCET/ROXICET) 5-325 MG tablet Take 1 tablet by mouth every 6 (six) hours as needed. 09/03/18   [provider]  PROAIR HFA 108 (90 BASE) MCG/ACT inhaler Inhale 2 puffs into the lungs every 6 (six) hours as needed for wheezing or shortness of breath.  08/14/13   [provider]  rosuvastatin (CRESTOR) 40 MG tablet Take 1 tablet (40 mg total) by mouth daily. 09/10/18   Orson Eva, MD  zolpidem (AMBIEN CR) 6.25 MG CR tablet Take 6.25 mg by mouth at bedtime as needed. 06/15/20   [provider]    Allergies    Patient has no known allergies.  Review of Systems   Review of Systems  Constitutional: Negative for fever.  HENT: Negative for ear pain and sore throat.   Eyes: Negative for visual disturbance.  Respiratory: Negative for cough and shortness of breath.   Cardiovascular: Positive for chest pain (chest wall pain).  Gastrointestinal: Positive for abdominal pain. Negative for constipation, diarrhea, nausea and vomiting.  Genitourinary: Negative for dysuria and hematuria.  Musculoskeletal: Negative for back pain and neck pain.  Skin: Negative for rash.  Neurological: Negative for seizures and syncope.       Head injury, no loc  All other systems reviewed and are negative.   Physical Exam Updated Vital Signs BP 105/74   Pulse 64   Temp 98.2 F (36.8 C)   Resp (!) 21   Ht 5\' 11"  (1.803 m)   Wt 80.7 kg   SpO2 95%   BMI 24.81 kg/m   Physical Exam Vitals and nursing note reviewed.  Constitutional:      Appearance: He is well-developed.  HENT:     Head: Normocephalic and atraumatic.  Eyes:     Conjunctiva/sclera: Conjunctivae normal.  Cardiovascular:     Rate and Rhythm:  Normal rate and regular rhythm.     Heart sounds: Normal heart sounds. No murmur heard.   Pulmonary:     Effort: Pulmonary effort is normal. No respiratory distress.  Breath sounds: Normal breath sounds. No wheezing or rales.  Chest:     Chest wall: Tenderness (left chest wall) present.  Abdominal:     General: Bowel sounds are normal.     Palpations: Abdomen is soft.     Tenderness: There is abdominal tenderness. There is guarding. There is no rebound.     Comments: Large area of ecchymosis to the left abdomen that is ttp  Musculoskeletal:     Cervical back: Neck supple.  Skin:    General: Skin is warm and dry.  Neurological:     Mental Status: He is alert.     Comments: Mental Status:  Alert, thought content appropriate, able to give a coherent history. Speech fluent without evidence of aphasia. Able to follow 2 step commands without difficulty.  Cranial Nerves:  II: pupils equal, round, reactive to light III,IV, VI: ptosis not present, extra-ocular motions intact bilaterally  V,VII: smile symmetric, facial light touch sensation equal VIII: hearing grossly normal to voice  X: uvula elevates symmetrically  XI: bilateral shoulder shrug symmetric and strong XII: midline tongue extension without fassiculations Motor:  Normal tone. 5/5 strength of BUE and BLE major muscle groups including strong and equal grip strength and dorsiflexion/plantar flexion Sensory: light touch normal in all extremities.      ED Results / Procedures / Treatments   Labs (all labs ordered are listed, but only abnormal results are displayed) Labs Reviewed  COMPREHENSIVE METABOLIC PANEL - Abnormal; Notable for the following components:      Result Value   Glucose, Bld 202 (*)    All other components within normal limits  CBC WITH DIFFERENTIAL/PLATELET    EKG EKG Interpretation  Date/Time:  Sunday Dec 18 2020 21:18:07 EDT Ventricular Rate:  63 PR Interval:  196 QRS Duration: 159 QT  Interval:  417 QTC Calculation: 427 R Axis:   67 Text Interpretation: Sinus rhythm Nonspecific intraventricular conduction delay Anterior infarct, old No significant change since last tracing Confirmed by Zackowski, Scott ([id number]) on 12/18/2020 9:23:18 PM   Radiology No results found.  Procedures Procedures   Medications Ordered in ED Medications  oxyCODONE-acetaminophen (PERCOCET/ROXICET) 5-325 MG per tablet 1 tablet (1 tablet Oral Given 12/18/20 2248)    ED Course  I have reviewed the triage vital signs and the nursing notes.  Pertinent labs & imaging results that were available during my care of the patient were reviewed by me and considered in my medical decision making (see chart for details).    MDM Rules/Calculators/A&P                          59  year old male presents the emergency department today for evaluation of a fall that occurred 4 days ago.  He states that this was associated with a syncopal episode that occurred after getting up from the toilet after having a bowel movement.  He has had no further episodes of syncope since this occurred.  He is complaining of pain to the left chest wall and left abdomen where he is large area of ecchymosis.  He also sustained head trauma at this time.  He is on Plavix.  Reviewed/interpreted labs CBC wnl CMP wnl  EKG - Sinus rhythm Nonspecific intraventricular conduction delay Anterior infarct, old No significant change since last tracing   Reviewed/interpreted imaging CT head pending CT chest pending CT abd/pelvis pending  At shift change, pt pending CTs and labwork. If neg, feel pt appropriate for d/c home  with incentive spirometer and pain meds. Care transitioned to Dr. Dayna Barker to f/u on results.  Final Clinical Impression(s) / ED Diagnoses Final diagnoses:  Fall, initial encounter  Syncope, unspecified syncope type  Chest wall pain  Traumatic ecchymosis of abdominal wall, initial encounter    Rx / DC Orders ED  Discharge Orders    None       Bishop Dublin 12/18/20 2326    Mesner, Corene Cornea, MD 12/19/20 [phone]

## 2020-12-19 DIAGNOSIS — R55 Syncope and collapse: Secondary | ICD-10-CM | POA: Diagnosis not present

## 2020-12-19 MED ORDER — OXYCODONE-ACETAMINOPHEN 5-325 MG PO TABS: 1.0000 | ORAL_TABLET | Freq: Four times a day (QID) | ORAL | 0 refills | Status: DC | PRN

## 2020-12-19 NOTE — ED Notes (Signed)
ED Provider at bedside. 

## 2020-12-20 ENCOUNTER — Other Ambulatory Visit: Payer: Self-pay | Admitting: Internal Medicine

## 2020-12-20 MED FILL — Oxycodone w/ Acetaminophen Tab 5-325 MG: ORAL | Qty: 6 | Status: AC

## 2020-12-21 DIAGNOSIS — E612 Magnesium deficiency: Secondary | ICD-10-CM | POA: Diagnosis not present

## 2020-12-21 DIAGNOSIS — E7849 Other hyperlipidemia: Secondary | ICD-10-CM | POA: Diagnosis not present

## 2020-12-21 DIAGNOSIS — K219 Gastro-esophageal reflux disease without esophagitis: Secondary | ICD-10-CM | POA: Diagnosis not present

## 2020-12-21 DIAGNOSIS — I110 Hypertensive heart disease with heart failure: Secondary | ICD-10-CM | POA: Diagnosis not present

## 2020-12-21 DIAGNOSIS — E1165 Type 2 diabetes mellitus with hyperglycemia: Secondary | ICD-10-CM | POA: Diagnosis not present

## 2020-12-27 DIAGNOSIS — I110 Hypertensive heart disease with heart failure: Secondary | ICD-10-CM | POA: Diagnosis not present

## 2020-12-27 DIAGNOSIS — E1165 Type 2 diabetes mellitus with hyperglycemia: Secondary | ICD-10-CM | POA: Diagnosis not present

## 2020-12-27 DIAGNOSIS — K219 Gastro-esophageal reflux disease without esophagitis: Secondary | ICD-10-CM | POA: Diagnosis not present

## 2021-01-20 DIAGNOSIS — E7849 Other hyperlipidemia: Secondary | ICD-10-CM | POA: Diagnosis not present

## 2021-01-20 DIAGNOSIS — I119 Hypertensive heart disease without heart failure: Secondary | ICD-10-CM | POA: Diagnosis not present

## 2021-01-20 DIAGNOSIS — K219 Gastro-esophageal reflux disease without esophagitis: Secondary | ICD-10-CM | POA: Diagnosis not present

## 2021-01-20 DIAGNOSIS — E1165 Type 2 diabetes mellitus with hyperglycemia: Secondary | ICD-10-CM | POA: Diagnosis not present

## 2021-02-07 DIAGNOSIS — E1100 Type 2 diabetes mellitus with hyperosmolarity without nonketotic hyperglycemic-hyperosmolar coma (NKHHC): Secondary | ICD-10-CM | POA: Diagnosis not present

## 2021-02-07 DIAGNOSIS — Z23 Encounter for immunization: Secondary | ICD-10-CM | POA: Diagnosis not present

## 2021-02-07 DIAGNOSIS — R911 Solitary pulmonary nodule: Secondary | ICD-10-CM | POA: Diagnosis not present

## 2021-02-07 DIAGNOSIS — M5137 Other intervertebral disc degeneration, lumbosacral region: Secondary | ICD-10-CM | POA: Diagnosis not present

## 2021-02-07 DIAGNOSIS — Z794 Long term (current) use of insulin: Secondary | ICD-10-CM | POA: Diagnosis not present

## 2021-02-07 DIAGNOSIS — E782 Mixed hyperlipidemia: Secondary | ICD-10-CM | POA: Diagnosis not present

## 2021-02-07 DIAGNOSIS — L989 Disorder of the skin and subcutaneous tissue, unspecified: Secondary | ICD-10-CM | POA: Diagnosis not present

## 2021-02-07 DIAGNOSIS — I10 Essential (primary) hypertension: Secondary | ICD-10-CM | POA: Diagnosis not present

## 2021-02-07 DIAGNOSIS — Z0000 Encounter for general adult medical examination without abnormal findings: Secondary | ICD-10-CM | POA: Diagnosis not present

## 2021-02-07 DIAGNOSIS — I25118 Atherosclerotic heart disease of native coronary artery with other forms of angina pectoris: Secondary | ICD-10-CM | POA: Diagnosis not present

## 2021-02-27 ENCOUNTER — Telehealth: Payer: Self-pay | Admitting: Internal Medicine

## 2021-02-27 NOTE — Telephone Encounter (Signed)
Left a message for the patient to call back to see if he could come to the [place] location tomorrow

## 2021-02-27 NOTE — Telephone Encounter (Signed)
Would try to get in with APP for appt if available.  Thanks.  Dr Lemmie Evens

## 2021-02-27 NOTE — Telephone Encounter (Signed)
Pt c/o swelling: STAT is pt has developed SOB within 24 hours  How much weight have you gained and in what time span?  No weight gain   If swelling, where is the swelling located?  From the knees down   Are you currently taking a fluid pill?  No   Are you currently SOB?  No   Do you have a log of your daily weights (if so, list)? No log available  Have you gained 3 pounds in a day or 5 pounds in a week?  No   Have you traveled recently?  No

## 2021-02-27 NOTE — Telephone Encounter (Signed)
Returned the call to the patient. He stated that he has been having bilateral lower extremity edema from the knee down for the past week. He stated that when it does swell it is painful when he ambulates. He denies pain at rest. He denies any color changes and temperature change. He denies shortness of breath.   He did state that the swelling gets better overnight and when he elevates his legs. He watches his sodium intake and stays hydrated. He does not weigh daily.   He is past due for a follow up

## 2021-02-28 NOTE — Telephone Encounter (Signed)
Spoke with patient and scheduled him to see Dr. Debara Pickett tomorrow 03/01/21 @ 1030am at [place]

## 2021-03-01 ENCOUNTER — Other Ambulatory Visit: Payer: Self-pay

## 2021-03-01 ENCOUNTER — Ambulatory Visit (INDEPENDENT_AMBULATORY_CARE_PROVIDER_SITE_OTHER): Payer: Medicare Other | Admitting: Internal Medicine

## 2021-03-01 ENCOUNTER — Encounter (HOSPITAL_BASED_OUTPATIENT_CLINIC_OR_DEPARTMENT_OTHER): Payer: Self-pay | Admitting: Internal Medicine

## 2021-03-01 VITALS — BP 150/68 | HR 76 | Ht 71.0 in | Wt 196.2 lb

## 2021-03-01 DIAGNOSIS — M79606 Pain in leg, unspecified: Secondary | ICD-10-CM | POA: Diagnosis not present

## 2021-03-01 DIAGNOSIS — I739 Peripheral vascular disease, unspecified: Secondary | ICD-10-CM | POA: Diagnosis not present

## 2021-03-01 DIAGNOSIS — R600 Localized edema: Secondary | ICD-10-CM | POA: Diagnosis not present

## 2021-03-01 NOTE — Patient Instructions (Addendum)
Medication Instructions:  Your physician recommends that you continue on your current medications as directed. Please refer to the Current Medication list given to you today.  *If you need a refill on your cardiac medications before your next appointment, please call your pharmacy*   Testing/Procedures: Lower Extremity Arterial Doppler @ [place]. [address] [place]   Follow-Up: At [facility], you and your health needs are our priority.  As part of our continuing mission to provide you with exceptional heart care, we have created designated Provider Care Teams.  These Care Teams include your primary Cardiologist (physician) and Advanced Practice Providers (APPs -  Physician Assistants and Nurse Practitioners) who all work together to provide you with the care you need, when you need it.  We recommend signing up for the patient portal called "MyChart".  Sign up information is provided on this After Visit Summary.  MyChart is used to connect with patients for Virtual Visits (Telemedicine).  Patients are able to view lab/test results, encounter notes, upcoming appointments, etc.  Non-urgent messages can be sent to your provider as well.   To learn more about what you can do with MyChart, go to [email or web address].    Your next appointment:   12 month(s)  The format for your next appointment:   In Person  Provider:   K. Mali Hilty, MD   Other Instructions

## 2021-03-01 NOTE — Progress Notes (Signed)
OFFICE NOTE  Chief Complaint:  Leg swelling  Primary Care Physician: Pablo Lawrence, NP  HPI:  [name] is a 60 year old mildly overweight single, though lives with his girlfriend, Caucasian male father of one child seen for peripheral vascular disease because of claudication and diminished ABIs. Risk factors include continued tobacco abuse of one pack -2 pks per day, diabetes and hyperlipidemia. He was catheterized by Dr. Nicanor Bake 08/19/2012 demonstrated three-vessel disease. He ultimately underwent coronary artery bypass grafting x4 January 17 for LIMA to his LAD, a vein to the diagonal branch, ramus intermedius branch and to the RCA. He tolerated the procedure well. His EF was 50-55% by 2-D echo. Dopplers revealed ABIs of 0.5 and 0.7. He complaind of left greater than right claudication at one block. He underwent percutaneous revascularization using the Viance CTO catheter, diamondback orbital rotational atherectomy, chocolate balloon and IDEV Stent successfully to the mid Rt SFA. His main complaints are ongoing lower extremity swelling. This is fatigue and problems with his hips. He denies any chest pain or worsening shortness of breath.  He was recently sent to [facility] for additional peripheral arterial procedures to open the left SFA by Dr. [name] as Dr. [name] had great difficulty in crossing the lesion. Ultimately after 6 hours Dr. [name] was able to get across the lesion and improve arterial flow. Repeat Dopplers were performed recently demonstrating an ABI of 1.1 on the right and 1.0 on the left in September 2014.  He is also wondering today whether he can stop his Plavix.  [name] returns today for followup. He is doing quite well. He recent saw Dr. Luan Pulling. He was concerned about his ongoing smoking. He reports his blood sugars are improved however they are still not at goal. He denies any claudication symptoms and had ultrasound in March of this year which shows excellent blood  flow to his legs after stenting.   Saw [name] back today for annual follow-up. Several weeks ago he had some electrical chest pain which lasted for just a few seconds while sitting on the couch. Otherwise he has no symptoms. Recently he's lost about 3-4 pounds every month for the past several months. He's plagued by chronic back pain and seems to have a good appetite. He's had significant workup including chest CT endoscopy and other tests including extensive lab work without any clear etiology of his weight loss. He is struggling from back pain and is planning to go see his neurosurgeon. He reports weakness and numbness and tingling in his legs.   10/04/2016  [name] returns today for follow-up. Overall he seems to be doing well. He continues to get some pain in his legs which is worse after he rests and is sitting for a period of time. He says it's like a burning sensation that gets better. He denies any claudication symptoms and had recent lower extremity arterial Dopplers which showed patent waveforms and normal blood flow. He was recently reassessed with regards to cholesterol and it remains elevated. He was previously on cholesterol medicine but that was discontinued for unknown reasons. He is now back on Crestor 20 mg daily. He's managed to cut back his smoking to half pack per day but is not yet motivated to quit. He understands the significant cardiovascular complications related to this. He is also on insulin-dependent diabetic and is not at goal A1c, recently he reported being over 8.  10/11/2017  [name] is seen today for annual follow-up.  Incidentally  he recently has been having back problems and was seen by Dr. Arnoldo Morale.  He will likely need repeat back surgery based on his MRI findings.  He had previously had surgery by Dr. Joya Salm, who is retired.  He was referred for preoperative risk assessment.  He has a history of CABG as previously mentioned and he is on Plavix.  In addition he has  peripheral arterial disease.  Currently denies any claudication, chest pain or worsening shortness of breath, however over the winter he is not been very active.  In addition he is still a smoker but has cut back his smoking.  He understands that he is at high risk of poor wound healing is a smoker and but not only benefit from smoking cessation for surgery but as well as a lifetime benefit.  10/06/2018  [name] is seen today for hospital follow-up.  Recently seen in the hospital in February and admitted for chest pain.  He described as shooting chest pain which started the right side of his chest and went across.  He ultimately underwent nuclear stress testing which was negative for ischemia and EF was 70%.  He ruled out for acute MI.  He was subsequently discharged to follow-up with me.  His lab work however showed significant abnormalities, including marked dyslipidemia with a total cholesterol 196, HDL 40, LDL 87 and triglycerides 343.  His hemoglobin A1c is also poorly controlled at 9.5.  He has been complaining also of pain particularly in his left leg which is worse with exertion.  He does have a history of bilateral SFA stenting and PAD and had nonobstructive Dopplers in 2018.  11/11/2019  [name] is seen today in follow-up.  His primary care provider retired.  He is now seeing Dr. Cindie Laroche.  He reports he was not able to get his Ambien for about a month and he was quite upset about that.  Other than that recently he was told that he had an elevated PSA which might be prostate cancer.  This also upset him however he has been referred to see Dr. Jeffie Pollock with urology for further evaluation.  He is due for repeat lower extremity arterial Dopplers in May, but denies any lifestyle limiting claudication.  He also denies any chest pain or worsening shortness of breath.  EKG today shows sinus rhythm.  03/02/2019  [name] returns today for follow-up.  He called in the office for complaints about leg  swelling.  He notes that for a long period of time now his legs have been swelling particularly toward the end of the day.  In general after elevation in the morning his swelling has improved.  He is also complained of leg pain when walking particularly for more than 15 minutes or so.  It does not resolve abruptly when he rests.  He does have a history of some mild PAD by Dopplers in 2020.  He also has significant peripheral neuropathy secondary to diabetes.  He did have venous Dopplers earlier this year which showed no evidence of DVT.  He has known venous insufficiency.  PMHx:  Past Medical History:  Diagnosis Date   Arthritis    "some; in hips sometimes" (01/02/2013)   Back pain    "w/prolonged walking or standing" (01/02/2013)   CAD (coronary artery disease)    CABG X 4 08/2012   COPD (chronic obstructive pulmonary disease) (HCC)    Elevated PSA    Esophageal erosions 05/24/01   egd by Dr. Gala Romney  GERD (gastroesophageal reflux disease)    H/O hiatal hernia    History of gout    Hyperlipidemia    Hypertension    not on any medication for htn at present   PAD (peripheral artery disease) ([place])    LEA DOPPLER, 11/11/2012 - moderate arterial insufficiency to lower extremities at rest, BILATERAL SFA-demonstrates occlusive disease with reconstitution of flow   Tobacco abuse    Tubular adenoma of colon 08/28/11   Type II diabetes mellitus ([place])    Unintentional weight loss     Past Surgical History:  Procedure Laterality Date   ANTERIOR CERVICAL DECOMP/DISCECTOMY FUSION  12/19/2011   Procedure: ANTERIOR CERVICAL DECOMPRESSION/DISCECTOMY FUSION 2 LEVELS;  Surgeon: Floyce Stakes, MD;  Location: [facility];  Service: Neurosurgery;  Laterality: N/A;  Cervical four-five Cervical five-six  Anterior cervical decompression/diskectomy, fusion, plate   ATHERECTOMY N/A 01/02/2013   Procedure: ATHERECTOMY;  Surgeon: Lorretta Harp, MD;  Location: [facility] CATH LAB;  Service: Cardiovascular;  Laterality:  N/A;   BACK SURGERY     x3   CARDIAC CATHETERIZATION  08/19/2012   CABG warranted; Severe ostial LCx stenosis with mid to distal vessel occlusion   COLONOSCOPY W/ POLYPECTOMY  08/28/11   Rourk-tubular adenomas removed from ascending colon, suboptimal prep, diminutive rectal polyps   COLONOSCOPY WITH PROPOFOL N/A 06/13/2017   Procedure: COLONOSCOPY WITH PROPOFOL;  Surgeon: Daneil Dolin, MD;  Location: [facility];  Service: Endoscopy;  Laterality: N/A;  9:15am   CORONARY ARTERY BYPASS GRAFT  08/22/2012   Procedure: CORONARY ARTERY BYPASS GRAFTING (CABG);  Surgeon: Grace Isaac, MD;  Location: [place];  Service: Open Heart Surgery;  Laterality: N/A;  CABG x four,  using left internal mammary artery and right leg greater saphenous vein harvested endoscopically   ESOPHAGOGASTRODUODENOSCOPY  05/24/01   by Dr. Gala Romney   ESOPHAGOGASTRODUODENOSCOPY  08/28/11   Rourk-erosive reflux esophagitis, gastric erosions   ESOPHAGOGASTRODUODENOSCOPY (EGD) WITH PROPOFOL N/A 06/13/2017   Procedure: ESOPHAGOGASTRODUODENOSCOPY (EGD) WITH PROPOFOL;  Surgeon: Daneil Dolin, MD;  Location: [facility];  Service: Endoscopy;  Laterality: N/A;   INTRAOPERATIVE TRANSESOPHAGEAL ECHOCARDIOGRAM  08/22/2012   Procedure: INTRAOPERATIVE TRANSESOPHAGEAL ECHOCARDIOGRAM;  Surgeon: Grace Isaac, MD;  Location: [place];  Service: Open Heart Surgery;  Laterality: N/A;   LEFT HEART CATHETERIZATION WITH CORONARY ANGIOGRAM N/A 08/19/2012   Procedure: LEFT HEART CATHETERIZATION WITH CORONARY ANGIOGRAM;  Surgeon: [name], MD;  Location: [facility] CATH LAB;  Service: Cardiovascular;  Laterality: N/A;   LOWER EXTREMITY ANGIOGRAM N/A 11/17/2012   Procedure: LOWER EXTREMITY ANGIOGRAM;  Surgeon: Lorretta Harp, MD;  Location: [facility] CATH LAB;  Service: Cardiovascular;  Laterality: N/A;   LOWER EXTREMITY ANGIOGRAM N/A 02/02/2013   Procedure: LOWER EXTREMITY ANGIOGRAM;  Surgeon: Lorretta Harp, MD;  Location: [facility] CATH LAB;  Service:  Cardiovascular;  Laterality: N/A;   LUMBAR DISC SURGERY     LUMBAR FUSION  ~ 2011   PELVIC ABCESS DRAINAGE     x2   POLYPECTOMY  06/13/2017   Procedure: POLYPECTOMY;  Surgeon: Daneil Dolin, MD;  Location: [facility];  Service: Endoscopy;;  colon   SHOULDER SURGERY Right    VASCULAR SURGERY Right 01/02/2013   diamondback orbital rotational  atherectomy, chocolate balloon and IDEV  Stent.    FAMHx:  Family History  Problem Relation Age of Onset   Anesthesia problems Neg Hx    Colon cancer Neg Hx    Gastric cancer Neg Hx    Esophageal cancer  Neg Hx     SOCHx:   reports that he has been smoking cigarettes. He has a 39.00 pack-year smoking history. He has never used smokeless tobacco. He reports current alcohol use of about 6.0 standard drinks of alcohol per week. He reports that he does not use drugs.  ALLERGIES:  No Known Allergies  ROS: Pertinent items noted in HPI and remainder of comprehensive ROS otherwise negative.  HOME MEDS: Current Outpatient Medications  Medication Sig Dispense Refill   aspirin EC 81 MG EC tablet Take 1 tablet (81 mg total) by mouth daily.     Blood Glucose Monitoring Suppl (ONETOUCH VERIO FLEX SYSTEM) w/Device KIT      clopidogrel (PLAVIX) 75 MG tablet Take 1 tablet (75 mg total) by mouth daily with breakfast. 30 tablet 11   empagliflozin (JARDIANCE) 25 MG TABS tablet Take by mouth.     gabapentin (NEURONTIN) 300 MG capsule Take 1 capsule by mouth daily.     HUMALOG MIX 75/25 KWIKPEN (75-25) 100 UNIT/ML Kwikpen Inject 30-60 Units into the skin 2 (two) times daily. Patient takes 30 units in the morning and 60 units at night     icosapent Ethyl (VASCEPA) 1 g capsule TAKE (2) CAPSULES BY MOUTH TWICE DAILY. 120 capsule 0   isosorbide mononitrate (IMDUR) 30 MG 24 hr tablet Take 1 tablet (30 mg total) by mouth daily. 30 tablet 1   Lancets (ONETOUCH DELICA PLUS [id number]) MISC Apply topically daily.     Lancets (ONETOUCH DELICA PLUS [id number]) MISC Apply  topically 2 (two) times daily.     LANTUS SOLOSTAR 100 UNIT/ML Solostar Pen      losartan (COZAAR) 25 MG tablet TAKE ONE TABLET BY MOUTH ONCE DAILY. 90 tablet 3   metoprolol succinate (TOPROL-XL) 25 MG 24 hr tablet TAKE 1 TABLET BY MOUTH ONCE A DAY. 90 tablet 2   MYRBETRIQ 25 MG TB24 tablet Take 25 mg by mouth daily.     oxyCODONE-acetaminophen (PERCOCET) 10-325 MG tablet Take 1 tablet by mouth 4 (four) times daily as needed.     PROAIR HFA 108 (90 BASE) MCG/ACT inhaler Inhale 2 puffs into the lungs every 6 (six) hours as needed for wheezing or shortness of breath.      rosuvastatin (CRESTOR) 40 MG tablet Take 1 tablet (40 mg total) by mouth daily. 30 tablet 1   zolpidem (AMBIEN CR) 6.25 MG CR tablet Take 6.25 mg by mouth at bedtime as needed.     cyclobenzaprine (FLEXERIL) 10 MG tablet Take 1 tablet (10 mg total) by mouth 3 (three) times daily as needed for muscle spasms. (Patient not taking: Reported on 03/01/2021) 50 tablet 1   docusate sodium (COLACE) 100 MG capsule Take 1 capsule (100 mg total) by mouth 2 (two) times daily. (Patient not taking: Reported on 03/01/2021) 60 capsule 0   empagliflozin (JARDIANCE) 10 MG TABS tablet Take 10 mg by mouth daily. (Patient not taking: Reported on 03/01/2021) 90 tablet 3   losartan (COZAAR) 25 MG tablet Take by mouth. (Patient not taking: Reported on 03/01/2021)     ONETOUCH VERIO test strip 2 (two) times daily.     oxyCODONE-acetaminophen (PERCOCET/ROXICET) 5-325 MG tablet Take 1 tablet by mouth every 6 (six) hours as needed. (Patient not taking: Reported on 03/01/2021)     oxyCODONE-acetaminophen (PERCOCET/ROXICET) 5-325 MG tablet Take 1 tablet by mouth every 6 (six) hours as needed for severe pain. (Patient not taking: Reported on 03/01/2021) 6 tablet 0   No current facility-administered medications for  this visit.    LABS/IMAGING: No results found for this or any previous visit (from the past 48 hour(s)). No results found.  VITALS: BP (!) 150/68    Pulse 76   Ht _0  (1.803 m)   Wt 196 lb 3.2 oz (89 kg)   SpO2 97%   BMI 27.36 kg/m   EXAM: General appearance: alert and no distress Neck: no JVD and supple, symmetrical, trachea midline Lungs: clear to auscultation bilaterally Heart: regular rate and rhythm, S1, S2 normal, no murmur, click, rub or gallop Abdomen: soft, non-tender; bowel sounds normal; no masses,  no organomegaly Extremities: none, bilateral leg muscle atrophy Pulses: 2+ and symmetric Skin: Skin color, texture, turgor normal. No rashes or lesions Neurologic: Grossly normal Psych: Mildy irritable  EKG: Deferred  ASSESSMENT: Coronary disease status post four-vessel CABG in 2014 60 pack year tobacco user - cut back to 1/2 ppd Insulin-dependent diabetes Dyslipidemia Significant PAD status post bilateral SFA intervention Lower extremity edema Elevated PSA Peripheral neuropathy  PLAN: 1.   Mr. Maish again has had issues with recurrent leg edema.  He says its worse as the day goes on when he is up on his feet.  I think this is most prominently due to lower extremity neuropathy secondary to diabetes.  He does have some mild PAD and had a bilateral SFA intervention.  His last arterial Dopplers were in 2020.  He has been complaining some leg pain when walking certain distances.  I like to repeat those Dopplers to make sure there is a new arterial insufficiency, otherwise then would recommend lower extremity compression stockings.  Follow-up with me annually or sooner as necessary.  [name], MD, [facility], [place] Director of the [facility] Lipid Disorders &  Cardiovascular Risk Reduction Clinic Diplomate of the American Board of Clinical Lipidology Attending Cardiologist  Direct Dial: [phone]  Fax: [phone]  Website:  [email or web address] 03/01/2021, 11:09 AM

## 2021-03-06 ENCOUNTER — Other Ambulatory Visit (HOSPITAL_BASED_OUTPATIENT_CLINIC_OR_DEPARTMENT_OTHER): Payer: Self-pay | Admitting: Internal Medicine

## 2021-03-06 DIAGNOSIS — M79606 Pain in leg, unspecified: Secondary | ICD-10-CM

## 2021-03-15 DIAGNOSIS — M65312 Trigger thumb, left thumb: Secondary | ICD-10-CM | POA: Diagnosis not present

## 2021-03-16 ENCOUNTER — Ambulatory Visit (HOSPITAL_COMMUNITY)
Admission: RE | Admit: 2021-03-16 | Discharge: 2021-03-16 | Disposition: A | Discharge status: Home / Self Care | Payer: Medicare Other | Source: Ambulatory Visit | Attending: Cardiology | Admitting: Cardiology

## 2021-03-16 ENCOUNTER — Other Ambulatory Visit (HOSPITAL_BASED_OUTPATIENT_CLINIC_OR_DEPARTMENT_OTHER): Payer: Self-pay | Admitting: Internal Medicine

## 2021-03-16 ENCOUNTER — Telehealth: Payer: Self-pay | Admitting: Internal Medicine

## 2021-03-16 ENCOUNTER — Other Ambulatory Visit: Payer: Self-pay

## 2021-03-16 DIAGNOSIS — M79606 Pain in leg, unspecified: Secondary | ICD-10-CM | POA: Diagnosis not present

## 2021-03-16 DIAGNOSIS — I739 Peripheral vascular disease, unspecified: Secondary | ICD-10-CM

## 2021-03-16 DIAGNOSIS — Z95820 Peripheral vascular angioplasty status with implants and grafts: Secondary | ICD-10-CM

## 2021-03-16 NOTE — Telephone Encounter (Signed)
   [facility] Pre-operative Risk Assessment    Patient Name: [name]  DOB: 1961/07/28 MRN: [id number]    Request for surgical clearance:  What type of surgery is being performed? Left trigger thumb release  When is this surgery scheduled? TBD  What type of clearance is required (medical clearance vs. Pharmacy clearance to hold med vs. Both)? Both   Are there any medications that need to be held prior to surgery and how long? Aspirin, Plavix  Practice name and name of physician performing surgery? Marchia Bond, MD ([facility])  What is the office phone number? [phone] (ext: [phone] for Sherri)   7.   What is the office fax number? [phone] (attn: Sherri)  8.   Anesthesia type (None, local, MAC, general) ? Choice    Sheral Apley M 03/16/2021, 4:02 PM  _________________________________________________________________   (provider comments below)

## 2021-03-20 NOTE — Telephone Encounter (Signed)
    Patient Name: [name]  DOB: 10-08-1960 MRN: [id number]  Primary Cardiologist: [name], MD  Chart reviewed as part of pre-operative protocol coverage. Given past medical history and time since last visit, based on ACC/AHA guidelines, [name] would be at acceptable risk for the planned procedure without further cardiovascular testing.   The patient was advised that if he develops new symptoms prior to surgery to contact our office to arrange for a follow-up visit, and he verbalized understanding. Per Dr. Debara Pickett, patient can hold plavix 5 days and aspirin 7 days prior to his upcoming surgery with plans to restart as soon as he is cleared to do so by his surgeon.  I will route this recommendation to the requesting party via Epic fax function and remove from pre-op pool.  Please call with questions.  Abigail Butts, PA-C 03/20/2021, 1:54 PM

## 2021-03-20 NOTE — Telephone Encounter (Signed)
   Name: [name]  DOB: [birth year]  MRN: [id number]   Primary Cardiologist: [name], MD  Chart reviewed as part of pre-operative protocol coverage. Patient was contacted 03/20/2021 in reference to pre-operative risk assessment for pending surgery as outlined below.  [name] was last seen on 03/01/21 by Dr. Debara Pickett.  Since that day, [name] has done fine from a cardiac standpoint. He is limited in activity by claudication but can complete 4 METs without anginal complaints.  He recently underwent LE arterial duplex/ABI's which revealed severe stenosis in the left proximal SFA for which a referral to Dr. [name] was recommended. He has prior stenting to his right SFA and left SFA. In addition, he has a history of CAD s/p CABG in 2014 and has not had an and subsequent coronary artery/graft revascularization since then.   Dr. Debara Pickett - do you feel his PAD needs to be addressed prior to his thumb surgery? Also, he has been previously cleared to hold plavix prior to a procedure, but can you comment on recommendations for holding aspirin? Please route your response back to P CV DIV PREOP. Thanks!  Abigail Butts, PA-C 03/20/2021, 11:06 AM

## 2021-03-20 NOTE — Telephone Encounter (Signed)
Ok to hold Plavix 5 days and aspirin 7 days for thumb surgery, then resume after.  Dr Debara Pickett

## 2021-03-21 ENCOUNTER — Telehealth: Payer: Self-pay | Admitting: Internal Medicine

## 2021-03-21 NOTE — Telephone Encounter (Signed)
    [name], MD  03/17/2021  3:28 PM EDT     Severe progression of left proximal SFA - 75-99% stenosis - please refer back to Dr. [name]   Dr H   Advised patient and scheduled follow up visit

## 2021-03-21 NOTE — Telephone Encounter (Signed)
   Pt's friend calling, she is requesting for a nurse to call pt back regarding pt's ABI results. Also, pt been having weakness with his legs

## 2021-03-23 ENCOUNTER — Other Ambulatory Visit: Payer: Self-pay | Admitting: Internal Medicine

## 2021-03-28 ENCOUNTER — Ambulatory Visit (INDEPENDENT_AMBULATORY_CARE_PROVIDER_SITE_OTHER): Payer: Medicare Other | Admitting: Cardiovascular Disease

## 2021-03-28 ENCOUNTER — Other Ambulatory Visit: Payer: Self-pay

## 2021-03-28 ENCOUNTER — Encounter: Payer: Self-pay | Admitting: Cardiovascular Disease

## 2021-03-28 DIAGNOSIS — I739 Peripheral vascular disease, unspecified: Secondary | ICD-10-CM

## 2021-03-28 NOTE — Assessment & Plan Note (Signed)
History of peripheral arterial disease status post right SFA orbital atherectomy [place] followed by PTA and stenting using an IDEV self-expanding stent (5 mm 120 mm) 12/23/2012.  I attempted unsuccessfully to revascularize his left SFA CTO 2 months later and ultimately sent him to [facility] where Dr. Brunetta Jeans been 6 hours ultimately successfully opening up the CTO.  He has continue to smoke a pack a day.  Over the last several months he has noticed left greater than right lower extremity lifestyle limiting claudication with recent Doppler studies performed in our office 03/16/2021 revealing a right ABI of 0.80 and a left of 0.74 with a high-frequency signal in his left SFA.  He wishes to proceed with endovascular therapy for lifestyle limiting claudication.

## 2021-03-28 NOTE — H&P (View-Only) (Signed)
03/28/2021 [name]   [birth year]  [id number]  Primary Physician Pablo Lawrence, NP Primary Cardiologist: Lorretta Harp MD Lupe Carney, [place]  HPI:  [name] is a 60 y.o.  mildly overweight single, though lives with his girlfriend Ivin Booty, [place] male father of one child seen for peripheral vascular disease because of claudication and diminished ABIs.  I last saw him in the office 02/12/2013.  Risk factors include continued tobacco abuse of one pack -2 pks per day, diabetes and hyperlipidemia. He was catheterized by Dr. Nicanor Bake 08/19/2012 demonstrated three-vessel disease. He ultimately underwent coronary artery bypass grafting x4 January 17 for LIMA to his LAD, a vein to the diagonal branch, ramus intermedius branch and to the RCA. He tolerated the procedure well. His EF was 50-55% by 2-D echo. Dopplers revealed ABIs of 0.5 and 0.7. He complaind of left greater than right claudication at one block. He underwent percutaneous revascularization using the Viance CTO catheter, diamondback orbital rotational atherectomy, chocolate balloon and IDEV Stent successfully to the mid Rt SFA. He enjoyed an excellent angiographic and clinical result with improvement Dopplers. He underwent an attempt at percutaneous revascularization of his long segment calcified left SFA CTO for lifestyle limiting claudication.I was unable to cross the lesion with a Viance CTO catheter.the patient presents in followup. Continues to have left lower extremity claudication. Also continues to smoke. We discussed options including retrograde attempt at history catheterization by Dr. Brunetta Jeans in [place] versus femoropopliteal bypass grafting. Since she's had coronary bypass grafting, I suspect he does not have adequate venous conduits left to perform lower shimmy surgical room randomization and what was thought to require PTFE.  I did send him to [facility] where Dr. Brunetta Jeans been 6 hours revascularizing a left  SFA CTO successfully.  He is continue to smoke a pack a day.  Over the last several months he has noticed increasing left greater than right lower extremity lifestyle of any claudication with recent Doppler studies performed 03/16/2021 revealing a right ABI of 0.80 and a left of 0.74.  He does have a high-frequency signal in his left SFA.  He wishes to proceed with outpatient endovascular therapy.  Current Meds  Medication Sig   aspirin EC 81 MG EC tablet Take 1 tablet (81 mg total) by mouth daily.   Blood Glucose Monitoring Suppl (ONETOUCH VERIO FLEX SYSTEM) w/Device KIT    clopidogrel (PLAVIX) 75 MG tablet Take 1 tablet (75 mg total) by mouth daily with breakfast.   cyclobenzaprine (FLEXERIL) 10 MG tablet Take 1 tablet (10 mg total) by mouth 3 (three) times daily as needed for muscle spasms.   docusate sodium (COLACE) 100 MG capsule Take 1 capsule (100 mg total) by mouth 2 (two) times daily.   empagliflozin (JARDIANCE) 10 MG TABS tablet Take 10 mg by mouth daily.   empagliflozin (JARDIANCE) 25 MG TABS tablet Take by mouth.   gabapentin (NEURONTIN) 300 MG capsule Take 1 capsule by mouth daily.   HUMALOG MIX 75/25 KWIKPEN (75-25) 100 UNIT/ML Kwikpen Inject 30-60 Units into the skin 2 (two) times daily. Patient takes 30 units in the morning and 60 units at night   icosapent Ethyl (VASCEPA) 1 g capsule TAKE (2) CAPSULES BY MOUTH TWICE DAILY.   isosorbide mononitrate (IMDUR) 30 MG 24 hr tablet Take 1 tablet (30 mg total) by mouth daily.   Lancets (ONETOUCH DELICA PLUS [id number]) MISC Apply topically daily.   Lancets (ONETOUCH DELICA PLUS [id number]) [place] Apply topically  2 (two) times daily.   LANTUS SOLOSTAR 100 UNIT/ML Solostar Pen    losartan (COZAAR) 25 MG tablet TAKE ONE TABLET BY MOUTH ONCE DAILY.   losartan (COZAAR) 25 MG tablet Take by mouth.   metoprolol succinate (TOPROL-XL) 25 MG 24 hr tablet TAKE 1 TABLET BY MOUTH ONCE A DAY.   MYRBETRIQ 25 MG TB24 tablet Take 25 mg by mouth daily.    ONETOUCH VERIO test strip 2 (two) times daily.   oxyCODONE-acetaminophen (PERCOCET) 10-325 MG tablet Take 1 tablet by mouth 4 (four) times daily as needed.   oxyCODONE-acetaminophen (PERCOCET/ROXICET) 5-325 MG tablet Take 1 tablet by mouth every 6 (six) hours as needed.   oxyCODONE-acetaminophen (PERCOCET/ROXICET) 5-325 MG tablet Take 1 tablet by mouth every 6 (six) hours as needed for severe pain.   PROAIR HFA 108 (90 BASE) MCG/ACT inhaler Inhale 2 puffs into the lungs every 6 (six) hours as needed for wheezing or shortness of breath.    rosuvastatin (CRESTOR) 40 MG tablet Take 1 tablet (40 mg total) by mouth daily.   zolpidem (AMBIEN CR) 6.25 MG CR tablet Take 6.25 mg by mouth at bedtime as needed.     No Known Allergies  Social History   Socioeconomic History   Marital status: Single    Spouse name: Not on file   Number of children: 1   Years of education: Not on file   Highest education level: Not on file  Occupational History   Occupation: disabled    Employer: DISABLED  Tobacco Use   Smoking status: Every Day    Packs/day: 1.00    Years: 39.00    Pack years: 39.00    Types: Cigarettes   Smokeless tobacco: Never  Vaping Use   Vaping Use: Never used  Substance and Sexual Activity   Alcohol use: Yes    Alcohol/week: 6.0 standard drinks    Types: 6 Cans of beer per week   Drug use: No   Sexual activity: Not Currently  Other Topics Concern   Not on file  Social History Narrative   Lives w/ girlfriend   Social Determinants of Health   Financial Resource Strain: Not on file  Food Insecurity: Not on file  Transportation Needs: Not on file  Physical Activity: Not on file  Stress: Not on file  Social Connections: Not on file  Intimate Partner Violence: Not on file     Review of Systems: General: negative for chills, fever, night sweats or weight changes.  Cardiovascular: negative for chest pain, dyspnea on exertion, edema, orthopnea, palpitations, paroxysmal  nocturnal dyspnea or shortness of breath Dermatological: negative for rash Respiratory: negative for cough or wheezing Urologic: negative for hematuria Abdominal: negative for nausea, vomiting, diarrhea, bright red blood per rectum, melena, or hematemesis Neurologic: negative for visual changes, syncope, or dizziness All other systems reviewed and are otherwise negative except as noted above.    Blood pressure 138/80, pulse 78, height 5' 11" (1.803 m), weight 193 lb (87.5 kg).  General appearance: alert and no distress Neck: no adenopathy, no carotid bruit, no JVD, supple, symmetrical, trachea midline, and thyroid not enlarged, symmetric, no tenderness/mass/nodules Lungs: clear to auscultation bilaterally Heart: regular rate and rhythm, S1, S2 normal, no murmur, click, rub or gallop Extremities: extremities normal, atraumatic, no cyanosis or edema Pulses: 2+ and symmetric Skin: Skin color, texture, turgor normal. No rashes or lesions Neurologic: Grossly normal  EKG not performed today  ASSESSMENT AND PLAN:   PAD (peripheral artery disease), Viance crossing of  mid rt SFA, with diamond back athrectomy and debulking, chocolate balloon and stent with IDEV.  Unsucessful revascularization Lt SFA  History of peripheral arterial disease status post right SFA orbital atherectomy Colb followed by PTA and stenting using an IDEV self-expanding stent (5 mm 120 mm) 12/23/2012.  I attempted unsuccessfully to revascularize his left SFA CTO 2 months later and ultimately sent him to [facility] where Dr. George Adams been 6 hours ultimately successfully opening up the CTO.  He has continue to smoke a pack a day.  Over the last several months he has noticed left greater than right lower extremity lifestyle limiting claudication with recent Doppler studies performed in our office 03/16/2021 revealing a right ABI of 0.80 and a left of 0.74 with a high-frequency signal in his left SFA.  He wishes to proceed with  endovascular therapy for lifestyle limiting claudication.     Mayumi Summerson J. Chante Mayson MD FACP,FACC,FAHA, FSCAI 03/28/2021 3:55 PM  

## 2021-03-28 NOTE — Patient Instructions (Signed)
Medication Instructions:  The current medical regimen is effective;  continue present plan and medications.  *If you need a refill on your cardiac medications before your next appointment, please call your pharmacy*   Lab Work: BMET, CBC today   If you have labs (blood work) drawn today and your tests are completely normal, you will receive your results only by: [name] (if you have MyChart) OR A paper copy in the mail If you have any lab test that is abnormal or we need to change your treatment, we will call you to review the results.   Testing/Procedures: Your physician has requested that you have a peripheral vascular angiogram. This exam is performed at the hospital. During this exam IV contrast is used to look at arterial blood flow. Please review the information sheet given for details.  Your physician has requested that you have a lower or upper extremity arterial duplex. This test is an ultrasound of the arteries in the legs or arms. It looks at arterial blood flow in the legs and arms. Allow one hour for Lower and Upper Arterial scans. There are no restrictions or special instructions  Your physician has requested that you have an ankle brachial index (ABI). During this test an ultrasound and blood pressure cuff are used to evaluate the arteries that supply the arms and legs with blood. Allow thirty minutes for this exam. There are no restrictions or special instructions.   Follow-Up: At [facility], you and your health needs are our priority.  As part of our continuing mission to provide you with exceptional heart care, we have created designated Provider Care Teams.  These Care Teams include your primary Cardiologist (physician) and Advanced Practice Providers (APPs -  Physician Assistants and Nurse Practitioners) who all work together to provide you with the care you need, when you need it.  We recommend signing up for the patient portal called "MyChart".  Sign up  information is provided on this After Visit Summary.  MyChart is used to connect with patients for Virtual Visits (Telemedicine).  Patients are able to view lab/test results, encounter notes, upcoming appointments, etc.  Non-urgent messages can be sent to your provider as well.   To learn more about what you can do with MyChart, go to [email or web address].    Your next appointment:   2 week(s)  The format for your next appointment:   In Person  Provider:   [name], MD   Other Instructions  [name] Dept: [phone] Loc: [phone]  [name]  03/28/2021  You are scheduled for a Peripheral Angiogram on Thursday, August 25 with Dr. Quay Reed.  1. Please arrive at the [facility] (Main Entrance A) at [facility]: [address] [place], [place] [address] at 7:30 AM (This time is two hours before your procedure to ensure your preparation). Free valet parking service is available.   Special note: Every effort is made to have your procedure done on time. Please understand that emergencies sometimes delay scheduled procedures.  2. Diet: Do not eat solid foods after midnight.  The patient may have clear liquids until 5am upon the day of the procedure.  3. Labs: You will need to have blood drawn today. CBC, BMET. You do not need to be fasting.  4. Medication instructions in preparation for your procedure:   Contrast Allergy: No   Take only 30 units  of insulin the night before your procedure. Do not take any insulin on the day of the procedure. (Humalog)  On the morning of your procedure, take your Plavix/Clopidogrel and any morning medicines NOT listed above.  You may use sips of water.  5. Plan for one night stay--bring personal belongings. 6. Bring a current list of your medications and current insurance cards. 7. You MUST  have a responsible person to drive you home. 8. Someone MUST be with you the first 24 hours after you arrive home or your discharge will be delayed. 9. Please wear clothes that are easy to get on and off and wear slip-on shoes.  Thank you for allowing [place] to care for you!   -- [facility]

## 2021-03-28 NOTE — Progress Notes (Signed)
03/28/2021 [name]   [birth year]  [id number]  Primary Physician Pablo Lawrence, NP Primary Cardiologist: Lorretta Harp MD [name], [place]  HPI:  [name] is a 60 y.o.  mildly overweight single, though lives with his girlfriend [name], [place] male father of one child seen for peripheral vascular disease because of claudication and diminished ABIs.  I last saw him in the office 02/12/2013.  Risk factors include continued tobacco abuse of one pack -2 pks per day, diabetes and hyperlipidemia. He was catheterized by Dr. Nicanor Bake 08/19/2012 demonstrated three-vessel disease. He ultimately underwent coronary artery bypass grafting x4 January 17 for LIMA to his LAD, a vein to the diagonal branch, ramus intermedius branch and to the RCA. He tolerated the procedure well. His EF was 50-55% by 2-D echo. Dopplers revealed ABIs of 0.5 and 0.7. He complaind of left greater than right claudication at one block. He underwent percutaneous revascularization using the Viance CTO catheter, diamondback orbital rotational atherectomy, chocolate balloon and IDEV Stent successfully to the mid Rt SFA. He enjoyed an excellent angiographic and clinical result with improvement Dopplers. He underwent an attempt at percutaneous revascularization of his long segment calcified left SFA CTO for lifestyle limiting claudication.I was unable to cross the lesion with a Viance CTO catheter.the patient presents in followup. Continues to have left lower extremity claudication. Also continues to smoke. We discussed options including retrograde attempt at history catheterization by Dr. Brunetta Jeans in [place] versus femoropopliteal bypass grafting. Since she's had coronary bypass grafting, I suspect he does not have adequate venous conduits left to perform lower shimmy surgical room randomization and what was thought to require PTFE.  I did send him to [facility] where Dr. Brunetta Jeans been 6 hours revascularizing a left  SFA CTO successfully.  He is continue to smoke a pack a day.  Over the last several months he has noticed increasing left greater than right lower extremity lifestyle of any claudication with recent Doppler studies performed 03/16/2021 revealing a right ABI of 0.80 and a left of 0.74.  He does have a high-frequency signal in his left SFA.  He wishes to proceed with outpatient endovascular therapy.  Current Meds  Medication Sig   aspirin EC 81 MG EC tablet Take 1 tablet (81 mg total) by mouth daily.   Blood Glucose Monitoring Suppl (ONETOUCH VERIO FLEX SYSTEM) w/Device KIT    clopidogrel (PLAVIX) 75 MG tablet Take 1 tablet (75 mg total) by mouth daily with breakfast.   cyclobenzaprine (FLEXERIL) 10 MG tablet Take 1 tablet (10 mg total) by mouth 3 (three) times daily as needed for muscle spasms.   docusate sodium (COLACE) 100 MG capsule Take 1 capsule (100 mg total) by mouth 2 (two) times daily.   empagliflozin (JARDIANCE) 10 MG TABS tablet Take 10 mg by mouth daily.   empagliflozin (JARDIANCE) 25 MG TABS tablet Take by mouth.   gabapentin (NEURONTIN) 300 MG capsule Take 1 capsule by mouth daily.   HUMALOG MIX 75/25 KWIKPEN (75-25) 100 UNIT/ML Kwikpen Inject 30-60 Units into the skin 2 (two) times daily. Patient takes 30 units in the morning and 60 units at night   icosapent Ethyl (VASCEPA) 1 g capsule TAKE (2) CAPSULES BY MOUTH TWICE DAILY.   isosorbide mononitrate (IMDUR) 30 MG 24 hr tablet Take 1 tablet (30 mg total) by mouth daily.   Lancets (ONETOUCH DELICA PLUS [id number]) MISC Apply topically daily.   Lancets (ONETOUCH DELICA PLUS [id number]) [place] Apply topically  2 (two) times daily.   LANTUS SOLOSTAR 100 UNIT/ML Solostar Pen    losartan (COZAAR) 25 MG tablet TAKE ONE TABLET BY MOUTH ONCE DAILY.   losartan (COZAAR) 25 MG tablet Take by mouth.   metoprolol succinate (TOPROL-XL) 25 MG 24 hr tablet TAKE 1 TABLET BY MOUTH ONCE A DAY.   MYRBETRIQ 25 MG TB24 tablet Take 25 mg by mouth daily.    ONETOUCH VERIO test strip 2 (two) times daily.   oxyCODONE-acetaminophen (PERCOCET) 10-325 MG tablet Take 1 tablet by mouth 4 (four) times daily as needed.   oxyCODONE-acetaminophen (PERCOCET/ROXICET) 5-325 MG tablet Take 1 tablet by mouth every 6 (six) hours as needed.   oxyCODONE-acetaminophen (PERCOCET/ROXICET) 5-325 MG tablet Take 1 tablet by mouth every 6 (six) hours as needed for severe pain.   PROAIR HFA 108 (90 BASE) MCG/ACT inhaler Inhale 2 puffs into the lungs every 6 (six) hours as needed for wheezing or shortness of breath.    rosuvastatin (CRESTOR) 40 MG tablet Take 1 tablet (40 mg total) by mouth daily.   zolpidem (AMBIEN CR) 6.25 MG CR tablet Take 6.25 mg by mouth at bedtime as needed.     No Known Allergies  Social History   Socioeconomic History   Marital status: Single    Spouse name: Not on file   Number of children: 1   Years of education: Not on file   Highest education level: Not on file  Occupational History   Occupation: disabled    Employer: DISABLED  Tobacco Use   Smoking status: Every Day    Packs/day: 1.00    Years: 39.00    Pack years: 39.00    Types: Cigarettes   Smokeless tobacco: Never  Vaping Use   Vaping Use: Never used  Substance and Sexual Activity   Alcohol use: Yes    Alcohol/week: 6.0 standard drinks    Types: 6 Cans of beer per week   Drug use: No   Sexual activity: Not Currently  Other Topics Concern   Not on file  Social History Narrative   Lives w/ girlfriend   Social Determinants of Health   Financial Resource Strain: Not on file  Food Insecurity: Not on file  Transportation Needs: Not on file  Physical Activity: Not on file  Stress: Not on file  Social Connections: Not on file  Intimate Partner Violence: Not on file     Review of Systems: General: negative for chills, fever, night sweats or weight changes.  Cardiovascular: negative for chest pain, dyspnea on exertion, edema, orthopnea, palpitations, paroxysmal  nocturnal dyspnea or shortness of breath Dermatological: negative for rash Respiratory: negative for cough or wheezing Urologic: negative for hematuria Abdominal: negative for nausea, vomiting, diarrhea, bright red blood per rectum, melena, or hematemesis Neurologic: negative for visual changes, syncope, or dizziness All other systems reviewed and are otherwise negative except as noted above.    Blood pressure 138/80, pulse 78, height 5' 11" (1.803 m), weight 193 lb (87.5 kg).  General appearance: alert and no distress Neck: no adenopathy, no carotid bruit, no JVD, supple, symmetrical, trachea midline, and thyroid not enlarged, symmetric, no tenderness/mass/nodules Lungs: clear to auscultation bilaterally Heart: regular rate and rhythm, S1, S2 normal, no murmur, click, rub or gallop Extremities: extremities normal, atraumatic, no cyanosis or edema Pulses: 2+ and symmetric Skin: Skin color, texture, turgor normal. No rashes or lesions Neurologic: Grossly normal  EKG not performed today  ASSESSMENT AND PLAN:   PAD (peripheral artery disease), Viance crossing of  mid rt SFA, with diamond back athrectomy and debulking, chocolate balloon and stent with IDEV.  Unsucessful revascularization Lt SFA  History of peripheral arterial disease status post right SFA orbital atherectomy Colb followed by PTA and stenting using an IDEV self-expanding stent (5 mm 120 mm) 12/23/2012.  I attempted unsuccessfully to revascularize his left SFA CTO 2 months later and ultimately sent him to [facility] where Dr. George Adams been 6 hours ultimately successfully opening up the CTO.  He has continue to smoke a pack a day.  Over the last several months he has noticed left greater than right lower extremity lifestyle limiting claudication with recent Doppler studies performed in our office 03/16/2021 revealing a right ABI of 0.80 and a left of 0.74 with a high-frequency signal in his left SFA.  He wishes to proceed with  endovascular therapy for lifestyle limiting claudication.     Jonathan J. Berry MD FACP,FACC,FAHA, FSCAI 03/28/2021 3:55 PM  

## 2021-03-29 ENCOUNTER — Encounter: Payer: Self-pay | Admitting: Cardiovascular Disease

## 2021-03-29 ENCOUNTER — Telehealth: Payer: Self-pay | Admitting: *Deleted

## 2021-03-29 LAB — BASIC METABOLIC PANEL
BUN/Creatinine Ratio: 15 (ref 9–20)
BUN: 15 mg/dL (ref 6–24)
CO2: 21 mmol/L (ref 20–29)
Calcium: 9.6 mg/dL (ref 8.7–10.2)
Chloride: 98 mmol/L (ref 96–106)
Creatinine, Ser: 0.99 mg/dL (ref 0.76–1.27)
Glucose: 157 mg/dL — ABNORMAL HIGH (ref 65–99)
Potassium: 4.4 mmol/L (ref 3.5–5.2)
Sodium: 137 mmol/L (ref 134–144)
eGFR: 88 mL/min/{1.73_m2} (ref >59)

## 2021-03-29 LAB — CBC
Hematocrit: 46.3 % (ref 37.5–51.0)
Hemoglobin: 15.9 g/dL (ref 13.0–17.7)
MCH: 30.4 pg (ref 26.6–33.0)
MCHC: 34.3 g/dL (ref 31.5–35.7)
MCV: 89 fL (ref 79–97)
Platelets: 168 10*3/uL (ref 150–450)
RBC: 5.23 x10E6/uL (ref 4.14–5.80)
RDW: 12.4 % (ref 11.6–15.4)
WBC: 8.1 10*3/uL (ref 3.4–10.8)

## 2021-03-29 NOTE — Telephone Encounter (Signed)
[name], Friend of the patient called back returning Anne's call

## 2021-03-29 NOTE — Telephone Encounter (Addendum)
Abdominal aortogram scheduled at [facility] for: Thursday March 30, 2021 9:30 AM [facility] Main Entrance A [facility]) at: 7:30 AM   No solid food after midnight prior to cath, clear liquids until 5 AM day of procedure.  Medication instructions: Hold: -Jardiance-AM of procedure -Insulin-AM of procedure/1/2 usual dose HS prior to procedure if taking  Except hold medications morning medications can be taken pre-cath with sips of water including: -aspirin 81 mg -Plavix 75 mg    Confirmed patient has responsible adult to drive home post procedure and be with patient first 24 hours after arriving home.  Patients are allowed one visitor in the waiting room during the time they are at the hospital for their procedure. Both patient and visitor must wear a mask once they enter the hospital.   Patient reports does not currently have any symptoms concerning for COVID-19 and no household members with COVID-19 like illness.

## 2021-03-29 NOTE — Telephone Encounter (Signed)
Call placed to patient to review procedure instructions, no answer, voicemail message. °

## 2021-03-29 NOTE — Telephone Encounter (Signed)
Returned call, no answer, no voicemail message.

## 2021-03-29 NOTE — Telephone Encounter (Signed)
Error. No encounter needed

## 2021-03-30 ENCOUNTER — Other Ambulatory Visit: Payer: Self-pay

## 2021-03-30 ENCOUNTER — Encounter (HOSPITAL_COMMUNITY)
Admission: RE | Disposition: A | Discharge status: Home / Self Care | Payer: Self-pay | Source: Home / Self Care | Attending: Cardiovascular Disease

## 2021-03-30 ENCOUNTER — Encounter (HOSPITAL_COMMUNITY): Payer: Self-pay | Admitting: Cardiovascular Disease

## 2021-03-30 ENCOUNTER — Ambulatory Visit (HOSPITAL_COMMUNITY)
Admission: RE | Admit: 2021-03-30 | Discharge: 2021-03-31 | Disposition: A | Discharge status: Home / Self Care | Payer: Medicare Other | Attending: Cardiovascular Disease | Admitting: Cardiovascular Disease

## 2021-03-30 DIAGNOSIS — Z951 Presence of aortocoronary bypass graft: Secondary | ICD-10-CM | POA: Insufficient documentation

## 2021-03-30 DIAGNOSIS — E1151 Type 2 diabetes mellitus with diabetic peripheral angiopathy without gangrene: Secondary | ICD-10-CM | POA: Insufficient documentation

## 2021-03-30 DIAGNOSIS — Z720 Tobacco use: Secondary | ICD-10-CM | POA: Diagnosis present

## 2021-03-30 DIAGNOSIS — Z7982 Long term (current) use of aspirin: Secondary | ICD-10-CM | POA: Insufficient documentation

## 2021-03-30 DIAGNOSIS — F17210 Nicotine dependence, cigarettes, uncomplicated: Secondary | ICD-10-CM | POA: Diagnosis not present

## 2021-03-30 DIAGNOSIS — Z794 Long term (current) use of insulin: Secondary | ICD-10-CM | POA: Insufficient documentation

## 2021-03-30 DIAGNOSIS — I739 Peripheral vascular disease, unspecified: Secondary | ICD-10-CM | POA: Diagnosis present

## 2021-03-30 DIAGNOSIS — E782 Mixed hyperlipidemia: Secondary | ICD-10-CM | POA: Diagnosis not present

## 2021-03-30 DIAGNOSIS — Z95820 Peripheral vascular angioplasty status with implants and grafts: Secondary | ICD-10-CM | POA: Diagnosis not present

## 2021-03-30 DIAGNOSIS — I2510 Atherosclerotic heart disease of native coronary artery without angina pectoris: Secondary | ICD-10-CM | POA: Diagnosis present

## 2021-03-30 DIAGNOSIS — Z7902 Long term (current) use of antithrombotics/antiplatelets: Secondary | ICD-10-CM | POA: Diagnosis not present

## 2021-03-30 DIAGNOSIS — Z6826 Body mass index (BMI) 26.0-26.9, adult: Secondary | ICD-10-CM | POA: Insufficient documentation

## 2021-03-30 DIAGNOSIS — Z79899 Other long term (current) drug therapy: Secondary | ICD-10-CM | POA: Diagnosis not present

## 2021-03-30 DIAGNOSIS — I10 Essential (primary) hypertension: Secondary | ICD-10-CM | POA: Insufficient documentation

## 2021-03-30 DIAGNOSIS — Z955 Presence of coronary angioplasty implant and graft: Secondary | ICD-10-CM | POA: Insufficient documentation

## 2021-03-30 DIAGNOSIS — I70212 Atherosclerosis of native arteries of extremities with intermittent claudication, left leg: Secondary | ICD-10-CM | POA: Insufficient documentation

## 2021-03-30 DIAGNOSIS — E663 Overweight: Secondary | ICD-10-CM | POA: Diagnosis not present

## 2021-03-30 DIAGNOSIS — E118 Type 2 diabetes mellitus with unspecified complications: Secondary | ICD-10-CM

## 2021-03-30 HISTORY — PX: PERIPHERAL VASCULAR ATHERECTOMY: CATH118256

## 2021-03-30 HISTORY — PX: PERIPHERAL VASCULAR INTERVENTION: CATH118257

## 2021-03-30 HISTORY — PX: ABDOMINAL AORTOGRAM W/LOWER EXTREMITY: CATH118223

## 2021-03-30 LAB — GLUCOSE, CAPILLARY
Glucose-Capillary: 230 mg/dL — ABNORMAL HIGH (ref 70–99)
Glucose-Capillary: 153 mg/dL — ABNORMAL HIGH (ref 70–99)
Glucose-Capillary: 131 mg/dL — ABNORMAL HIGH (ref 70–99)

## 2021-03-30 LAB — POCT ACTIVATED CLOTTING TIME
Activated Clotting Time: 190 seconds
Activated Clotting Time: 132 seconds

## 2021-03-30 SURGERY — ABDOMINAL AORTOGRAM W/LOWER EXTREMITY
Anesthesia: LOCAL

## 2021-03-30 MED ORDER — ISOSORBIDE MONONITRATE ER 30 MG PO TB24
30.0000 mg | ORAL_TABLET | Freq: Every day | ORAL | Status: DC
  Administered 2021-03-31: 30 mg via ORAL
  Filled 2021-03-30 (×2): qty 1

## 2021-03-30 MED ORDER — NITROGLYCERIN 1 MG/10 ML FOR IR/CATH LAB
INTRA_ARTERIAL | Status: DC | PRN
  Administered 2021-03-30: 200 ug via INTRA_ARTERIAL

## 2021-03-30 MED ORDER — LIDOCAINE HCL (PF) 1 % IJ SOLN
INTRAMUSCULAR | Status: DC | PRN
  Administered 2021-03-30 (×2): 20 mL

## 2021-03-30 MED ORDER — ONDANSETRON HCL 4 MG/2ML IJ SOLN: 4.0000 mg | Freq: Four times a day (QID) | INTRAMUSCULAR | Status: DC | PRN

## 2021-03-30 MED ORDER — MORPHINE SULFATE (PF) 2 MG/ML IV SOLN
2.0000 mg | INTRAVENOUS | Status: DC | PRN
  Administered 2021-03-30: 2 mg via INTRAVENOUS
  Filled 2021-03-30: qty 1

## 2021-03-30 MED ORDER — VIPERSLIDE LUBRICANT OPTIME: TOPICAL | Status: DC | PRN

## 2021-03-30 MED ORDER — OXYCODONE-ACETAMINOPHEN 5-325 MG PO TABS
1.0000 | ORAL_TABLET | Freq: Four times a day (QID) | ORAL | Status: DC | PRN
  Administered 2021-03-30: 1 via ORAL
  Filled 2021-03-30: qty 1

## 2021-03-30 MED ORDER — FENTANYL CITRATE (PF) 100 MCG/2ML IJ SOLN
INTRAMUSCULAR | Status: DC | PRN
  Administered 2021-03-30 (×2): 25 ug via INTRAVENOUS

## 2021-03-30 MED ORDER — LIDOCAINE HCL (PF) 1 % IJ SOLN
INTRAMUSCULAR | Status: AC
  Filled 2021-03-30: qty 30

## 2021-03-30 MED ORDER — ASPIRIN EC 81 MG PO TBEC
81.0000 mg | DELAYED_RELEASE_TABLET | Freq: Every day | ORAL | Status: DC
  Administered 2021-03-31: 81 mg via ORAL
  Filled 2021-03-30: qty 1

## 2021-03-30 MED ORDER — ALBUTEROL SULFATE (2.5 MG/3ML) 0.083% IN NEBU: 3.0000 mL | INHALATION_SOLUTION | Freq: Four times a day (QID) | RESPIRATORY_TRACT | Status: DC | PRN

## 2021-03-30 MED ORDER — SODIUM CHLORIDE 0.9% FLUSH: 3.0000 mL | Freq: Two times a day (BID) | INTRAVENOUS | Status: DC

## 2021-03-30 MED ORDER — OXYCODONE-ACETAMINOPHEN 10-325 MG PO TABS
1.0000 | ORAL_TABLET | Freq: Four times a day (QID) | ORAL | Status: DC | PRN
Start: 2021-03-30 — End: 2021-03-30

## 2021-03-30 MED ORDER — HYDRALAZINE HCL 20 MG/ML IJ SOLN
INTRAMUSCULAR | Status: AC
  Filled 2021-03-30: qty 1

## 2021-03-30 MED ORDER — METOPROLOL SUCCINATE ER 25 MG PO TB24
25.0000 mg | ORAL_TABLET | Freq: Every day | ORAL | Status: DC
  Administered 2021-03-31: 25 mg via ORAL
  Filled 2021-03-30 (×2): qty 1

## 2021-03-30 MED ORDER — SODIUM CHLORIDE 0.9 % IV SOLN: 250.0000 mL | INTRAVENOUS | Status: DC | PRN

## 2021-03-30 MED ORDER — LABETALOL HCL 5 MG/ML IV SOLN
10.0000 mg | INTRAVENOUS | Status: DC | PRN
Start: 2021-03-30 — End: 2021-03-31
  Administered 2021-03-30: 10 mg via INTRAVENOUS
  Filled 2021-03-30: qty 4

## 2021-03-30 MED ORDER — MIDAZOLAM HCL 2 MG/2ML IJ SOLN
INTRAMUSCULAR | Status: DC | PRN
  Administered 2021-03-30: 1 mg via INTRAVENOUS

## 2021-03-30 MED ORDER — SODIUM CHLORIDE 0.9 % WEIGHT BASED INFUSION
3.0000 mL/kg/h | INTRAVENOUS | Status: DC
  Administered 2021-03-30: 3 mL/kg/h via INTRAVENOUS

## 2021-03-30 MED ORDER — HEPARIN (PORCINE) IN NACL 1000-0.9 UT/500ML-% IV SOLN
INTRAVENOUS | Status: AC
  Filled 2021-03-30: qty 500

## 2021-03-30 MED ORDER — ACETAMINOPHEN 325 MG PO TABS: 650.0000 mg | ORAL_TABLET | ORAL | Status: DC | PRN

## 2021-03-30 MED ORDER — OXYCODONE HCL 5 MG PO TABS: 5.0000 mg | ORAL_TABLET | ORAL | Status: DC | PRN

## 2021-03-30 MED ORDER — HEPARIN SODIUM (PORCINE) 1000 UNIT/ML IJ SOLN
INTRAMUSCULAR | Status: DC | PRN
  Administered 2021-03-30: 9000 [IU] via INTRAVENOUS

## 2021-03-30 MED ORDER — HYDRALAZINE HCL 20 MG/ML IJ SOLN
INTRAMUSCULAR | Status: DC | PRN
Start: 2021-03-30 — End: 2021-03-30
  Administered 2021-03-30: 10 mg via INTRAVENOUS

## 2021-03-30 MED ORDER — HEPARIN (PORCINE) IN NACL 1000-0.9 UT/500ML-% IV SOLN
INTRAVENOUS | Status: DC | PRN
Start: 2021-03-30 — End: 2021-03-30
  Administered 2021-03-30 (×2): 500 mL

## 2021-03-30 MED ORDER — SODIUM CHLORIDE 0.9 % IV SOLN: INTRAVENOUS | Status: AC

## 2021-03-30 MED ORDER — FENTANYL CITRATE (PF) 100 MCG/2ML IJ SOLN
INTRAMUSCULAR | Status: AC
  Filled 2021-03-30: qty 2

## 2021-03-30 MED ORDER — GABAPENTIN 300 MG PO CAPS
300.0000 mg | ORAL_CAPSULE | Freq: Every day | ORAL | Status: DC
  Administered 2021-03-30: 300 mg via ORAL
  Filled 2021-03-30: qty 1

## 2021-03-30 MED ORDER — CLOPIDOGREL BISULFATE 75 MG PO TABS: 75.0000 mg | ORAL_TABLET | ORAL | Status: DC

## 2021-03-30 MED ORDER — SODIUM CHLORIDE 0.9 % WEIGHT BASED INFUSION: 1.0000 mL/kg/h | INTRAVENOUS | Status: DC

## 2021-03-30 MED ORDER — SODIUM CHLORIDE 0.9% FLUSH: 3.0000 mL | INTRAVENOUS | Status: DC | PRN

## 2021-03-30 MED ORDER — VERAPAMIL HCL 2.5 MG/ML IV SOLN
INTRAVENOUS | Status: AC
  Filled 2021-03-30: qty 2

## 2021-03-30 MED ORDER — MIDAZOLAM HCL 2 MG/2ML IJ SOLN
INTRAMUSCULAR | Status: AC
  Filled 2021-03-30: qty 2

## 2021-03-30 MED ORDER — CLOPIDOGREL BISULFATE 75 MG PO TABS
75.0000 mg | ORAL_TABLET | Freq: Every day | ORAL | Status: DC
  Administered 2021-03-31: 75 mg via ORAL
  Filled 2021-03-30 (×3): qty 1

## 2021-03-30 MED ORDER — EMPAGLIFLOZIN 25 MG PO TABS
25.0000 mg | ORAL_TABLET | Freq: Every day | ORAL | Status: DC
  Administered 2021-03-31: 25 mg via ORAL
  Filled 2021-03-30: qty 1

## 2021-03-30 MED ORDER — LOSARTAN POTASSIUM 25 MG PO TABS
25.0000 mg | ORAL_TABLET | Freq: Every day | ORAL | Status: DC
  Administered 2021-03-31: 25 mg via ORAL
  Filled 2021-03-30 (×2): qty 1

## 2021-03-30 MED ORDER — NITROGLYCERIN IN D5W 200-5 MCG/ML-% IV SOLN
INTRAVENOUS | Status: AC
  Filled 2021-03-30: qty 250

## 2021-03-30 MED ORDER — ASPIRIN 81 MG PO CHEW: 81.0000 mg | CHEWABLE_TABLET | ORAL | Status: DC

## 2021-03-30 MED ORDER — ROSUVASTATIN CALCIUM 20 MG PO TABS
40.0000 mg | ORAL_TABLET | Freq: Every day | ORAL | Status: DC
  Administered 2021-03-31: 40 mg via ORAL
  Filled 2021-03-30 (×2): qty 2

## 2021-03-30 MED ORDER — HYDRALAZINE HCL 20 MG/ML IJ SOLN: 5.0000 mg | INTRAMUSCULAR | Status: DC | PRN

## 2021-03-30 MED ORDER — SODIUM CHLORIDE 0.9% FLUSH
3.0000 mL | Freq: Two times a day (BID) | INTRAVENOUS | Status: DC
  Administered 2021-03-31: 3 mL via INTRAVENOUS

## 2021-03-30 MED ORDER — EMPAGLIFLOZIN 10 MG PO TABS: 10.0000 mg | ORAL_TABLET | Freq: Every day | ORAL | Status: DC

## 2021-03-30 MED ORDER — HEPARIN SODIUM (PORCINE) 1000 UNIT/ML IJ SOLN
INTRAMUSCULAR | Status: AC
  Filled 2021-03-30: qty 1

## 2021-03-30 SURGICAL SUPPLY — 25 items
CATH ANGIO 5F PIGTAIL 65CM (CATHETERS) ×1 IMPLANT
CATH CROSS OVER TEMPO 5F (CATHETERS) ×1 IMPLANT
CATH STRAIGHT 5FR 65CM (CATHETERS) ×1 IMPLANT
CLOSURE MYNX CONTROL 5F (Vascular Products) ×1 IMPLANT
DEVICE CONTINUOUS FLUSH (MISCELLANEOUS) ×1 IMPLANT
DIAMONDBACK SOLID OAS 2.0MM (CATHETERS) ×3
KIT ENCORE 26 ADVANTAGE (KITS) ×1 IMPLANT
KIT PV (KITS) ×3 IMPLANT
LUBRICANT VIPERSLIDE CORONARY (MISCELLANEOUS) ×1 IMPLANT
SHEATH BRITE TIP 7FR 35CM (SHEATH) ×1 IMPLANT
SHEATH PINNACLE 5F 10CM (SHEATH) ×1 IMPLANT
SHEATH PINNACLE 7F 10CM (SHEATH) ×1 IMPLANT
SHEATH PROBE COVER 6X72 (BAG) ×2 IMPLANT
STENT VIABAHN 8X29X80 VBX (Permanent Stent) ×1 IMPLANT
STOPCOCK MORSE 400PSI 3WAY (MISCELLANEOUS) ×1 IMPLANT
SYR MEDRAD MARK 7 150ML (SYRINGE) ×3 IMPLANT
SYSTEM DIMNDBCK SLD OAS 2.0MM (CATHETERS) IMPLANT
TAPE VIPERTRACK RADIOPAQ (MISCELLANEOUS) IMPLANT
TAPE VIPERTRACK RADIOPAQUE (MISCELLANEOUS) ×3
TRANSDUCER W/STOPCOCK (MISCELLANEOUS) ×3 IMPLANT
TRAY PV CATH (CUSTOM PROCEDURE TRAY) ×3 IMPLANT
TUBING CIL FLEX 10 FLL-RA (TUBING) ×1 IMPLANT
WIRE HI TORQ VERSACORE J 260CM (WIRE) ×1 IMPLANT
WIRE HITORQ VERSACORE ST 145CM (WIRE) ×2 IMPLANT
WIRE VIPER ADVANCE .017X335CM (WIRE) ×1 IMPLANT

## 2021-03-30 NOTE — Progress Notes (Signed)
Site area: left groin  Site Prior to Removal:  Level 0  Pressure Applied For 20 MINUTES    Minutes Beginning at 1520  Manual:   Yes.    Patient Status During Pull:  stable  Post Pull Groin Site:  Level 0  Post Pull Instructions Given:  Yes.    Post Pull Pulses Present:  Yes.    Dressing Applied:  Yes.    Comments:  bed rest started at 1540

## 2021-03-30 NOTE — Interval H&P Note (Signed)
History and Physical Interval Note:  03/30/2021 9:08 AM  [name]  has presented today for surgery, with the diagnosis of pad.  The various methods of treatment have been discussed with the patient and family. After consideration of risks, benefits and other options for treatment, the patient has consented to  Procedure(s): ABDOMINAL AORTOGRAM W/LOWER EXTREMITY (N/A) as a surgical intervention.  The patient's history has been reviewed, patient examined, no change in status, stable for surgery.  I have reviewed the patient's chart and labs.  Questions were answered to the patient's satisfaction.     [name]

## 2021-03-31 ENCOUNTER — Encounter (HOSPITAL_COMMUNITY): Payer: Self-pay | Admitting: Cardiovascular Disease

## 2021-03-31 DIAGNOSIS — Z7902 Long term (current) use of antithrombotics/antiplatelets: Secondary | ICD-10-CM | POA: Diagnosis not present

## 2021-03-31 DIAGNOSIS — Z95820 Peripheral vascular angioplasty status with implants and grafts: Secondary | ICD-10-CM | POA: Diagnosis not present

## 2021-03-31 DIAGNOSIS — I70212 Atherosclerosis of native arteries of extremities with intermittent claudication, left leg: Secondary | ICD-10-CM | POA: Diagnosis not present

## 2021-03-31 DIAGNOSIS — F17210 Nicotine dependence, cigarettes, uncomplicated: Secondary | ICD-10-CM | POA: Diagnosis not present

## 2021-03-31 DIAGNOSIS — I739 Peripheral vascular disease, unspecified: Secondary | ICD-10-CM

## 2021-03-31 DIAGNOSIS — Z955 Presence of coronary angioplasty implant and graft: Secondary | ICD-10-CM | POA: Diagnosis not present

## 2021-03-31 DIAGNOSIS — Z951 Presence of aortocoronary bypass graft: Secondary | ICD-10-CM | POA: Diagnosis not present

## 2021-03-31 DIAGNOSIS — Z794 Long term (current) use of insulin: Secondary | ICD-10-CM | POA: Diagnosis not present

## 2021-03-31 DIAGNOSIS — I2510 Atherosclerotic heart disease of native coronary artery without angina pectoris: Secondary | ICD-10-CM | POA: Diagnosis not present

## 2021-03-31 DIAGNOSIS — Z79899 Other long term (current) drug therapy: Secondary | ICD-10-CM | POA: Diagnosis not present

## 2021-03-31 DIAGNOSIS — Z7982 Long term (current) use of aspirin: Secondary | ICD-10-CM | POA: Diagnosis not present

## 2021-03-31 DIAGNOSIS — E1151 Type 2 diabetes mellitus with diabetic peripheral angiopathy without gangrene: Secondary | ICD-10-CM | POA: Diagnosis not present

## 2021-03-31 DIAGNOSIS — I10 Essential (primary) hypertension: Secondary | ICD-10-CM | POA: Diagnosis not present

## 2021-03-31 DIAGNOSIS — E782 Mixed hyperlipidemia: Secondary | ICD-10-CM | POA: Diagnosis not present

## 2021-03-31 HISTORY — DX: Peripheral vascular angioplasty status with implants and grafts: Z95.820

## 2021-03-31 LAB — GLUCOSE, CAPILLARY
Glucose-Capillary: 272 mg/dL — ABNORMAL HIGH (ref 70–99)
Glucose-Capillary: 196 mg/dL — ABNORMAL HIGH (ref 70–99)

## 2021-03-31 LAB — CBC
HCT: 42.2 % (ref 39.0–52.0)
Hemoglobin: 14.1 g/dL (ref 13.0–17.0)
MCH: 30.4 pg (ref 26.0–34.0)
MCHC: 33.4 g/dL (ref 30.0–36.0)
MCV: 90.9 fL (ref 80.0–100.0)
Platelets: 129 10*3/uL — ABNORMAL LOW (ref 150–400)
RBC: 4.64 MIL/uL (ref 4.22–5.81)
RDW: 12.5 % (ref 11.5–15.5)
WBC: 8 10*3/uL (ref 4.0–10.5)
nRBC: 0 % (ref 0.0–0.2)

## 2021-03-31 LAB — BASIC METABOLIC PANEL
Anion gap: 8 (ref 5–15)
BUN: 12 mg/dL (ref 6–20)
CO2: 26 mmol/L (ref 22–32)
Calcium: 8.6 mg/dL — ABNORMAL LOW (ref 8.9–10.3)
Chloride: 101 mmol/L (ref 98–111)
Creatinine, Ser: 0.97 mg/dL (ref 0.61–1.24)
GFR, Estimated: >60 mL/min (ref >60)
Glucose, Bld: 198 mg/dL — ABNORMAL HIGH (ref 70–99)
Potassium: 3.8 mmol/L (ref 3.5–5.1)
Sodium: 135 mmol/L (ref 135–145)

## 2021-03-31 LAB — POCT ACTIVATED CLOTTING TIME
Activated Clotting Time: 341 seconds
Activated Clotting Time: 358 seconds
Activated Clotting Time: 271 seconds

## 2021-03-31 NOTE — Progress Notes (Signed)
Pt's left groin insertion site began oozing and bleeding after the pt stated that he coughed. Manual pressure was held and pressure dressing applied w/ Pt's HOB limited to 30 degrees; no hematoma assessed. Pt tolerated well, will continue to monitor.   Elaina Hoops, RN

## 2021-03-31 NOTE — Discharge Summary (Addendum)
Discharge Summary    Patient ID: [name] MRN: [id number]; DOB: 1960-08-07  Admit date: 03/30/2021 Discharge date: 03/31/2021  PCP:  Pablo Lawrence, NP   [facility] Providers Cardiologist:  [name], MD        Discharge Diagnoses    Principal Problem:   PVD (peripheral vascular disease) with claudication [facility]) Active Problems:   S/P percutaneous transluminal angioplasty (PTA) with stent placement 03/29/21   Type 2 diabetes mellitus with complication, with long-term current use of insulin (HCC)   Tobacco abuse   CAD in native artery   Claudication [facility])   Mixed hyperlipidemia   Claudication in peripheral vascular disease ([place])    Diagnostic Studies/Procedures    Angiographic Data: 03/30/21   1: Abdominal aorta-minimally aneurysmal 2: Left lower extremity-80% eccentric proximal calcified left common iliac artery stenosis, 40% segmental left extrailiac artery stenosis, 80% focal calcified proximal left SFA stenosis with a widely patent stent in the mid left SFA.  There is three-vessel runoff with 80% segmental proximal anterior tibial and posterior tibial stenoses. 3: Right lower extremity-50% segmental right extrailiac artery stenosis.  Widely patent I DEV stent in the right SFA.  60 to 70% calcified right above-the-knee popliteal artery stenosis with two-vessel runoff.  The anterior tibial was occluded.  There was an 80% right tibioperoneal trunk stenosis.   IMPRESSION: [name] has a high-grade calcified proximal eccentric left common iliac artery stenosis most likely responsible for his left lower extremity claudication.  He does have a focal 80% stenosis which is calcified in the proximal left SFA as well.  Both SFA stents are patent with tibial vessel disease.  We will proceed with left SFA orbital atherectomy followed by VBX covered stenting.   Final Impression: Successful orbital atherectomy followed by VBX covered stenting of a high-grade calcified  proximal left common iliac artery stenosis for lifestyle limiting claudication.  I did do a pullback gradient across the 40% segmental proximal left external iliac artery stenosis with a 5 French endhole catheter after administration of 200 mcg of intra-arterial nitroglycerin that did not demonstrate a gradient.  I suspect that this will have addressed his left lower extremity claudication.  If not, we can always come back and address the focal calcified proximal left SFA stenosis.  The left common femoral sheath will be removed once the ACT falls below 170 and pressure held.  Patient will be hydrated overnight, and discharged home in the morning.  We will obtain lower extremity arterial Doppler studies in our [facility] line office next week and I will see him back 1 to 2 weeks thereafter.  He left the lab in stable condition. _____________   History of Present Illness     [name] is a 60 y.o. male with hx of PAD with tobacco, diabetes, HTN, HLD, CAD with CABG X 4, 2014   (LIMA to LAD, SVG to [place], SVG to RAMUS INTERMEDIATE, and SVG to RCA), hx of Left SFA CTO.   With increasing claudication on Lt and ABIs with decrease on Lt. Pt scheduled for PV angio to eval and treat.    Pt presented 03/29/21 and underwent PV angio with results as above in addition to successful orbital atherectomy followed by VBX covered stenting of a high-grade calcified proximal left common iliac artery stenosis for lifestyle limiting claudication.  Pt did well with procedure and was to be discharged but had continued oozing of Lt groin so kept for overnight OBS.  Hospital Course  Consultants: none   Hx as above and pt kept overnight without further complications.  He walked today and seen and evaluated by Dr. Debara Pickett and found stable for discharge.  Glucose was stable. No angina and no SOB only chronic wheezes.   He has follow up appts for recheck ABIs and then Dr. [name].     Did the patient have an acute coronary  syndrome (MI, NSTEMI, STEMI, etc) this admission?:  No                               Did the patient have a percutaneous coronary intervention (stent / angioplasty)?:  No.       _____________  Discharge Vitals Blood pressure 139/67, pulse 71, temperature 98.6 F (37 C), temperature source Oral, resp. rate 18, height _0  (1.803 m), weight 87.5 kg, SpO2 90 %.  Filed Weights   03/30/21 0740  Weight: 87.5 kg    Labs & Radiologic Studies    CBC Recent Labs    03/28/21 1626 03/31/21 0216  WBC 8.1 8.0  HGB 15.9 14.1  HCT 46.3 42.2  MCV 89 90.9  PLT 168 694*   Basic Metabolic Panel Recent Labs    03/28/21 1626 03/31/21 0216  NA 137 135  K 4.4 3.8  CL 98 101  CO2 21 26  GLUCOSE 157* 198*  BUN 15 12  CREATININE 0.99 0.97  CALCIUM 9.6 8.6*   Liver Function Tests No results for input(s): AST, ALT, ALKPHOS, BILITOT, PROT, ALBUMIN in the last 72 hours. No results for input(s): LIPASE, AMYLASE in the last 72 hours. High Sensitivity Troponin:   No results for input(s): TROPONINIHS in the last 720 hours.  BNP Invalid input(s): POCBNP D-Dimer No results for input(s): DDIMER in the last 72 hours. Hemoglobin A1C No results for input(s): HGBA1C in the last 72 hours. Fasting Lipid Panel No results for input(s): CHOL, HDL, LDLCALC, TRIG, CHOLHDL, LDLDIRECT in the last 72 hours. Thyroid Function Tests No results for input(s): TSH, T4TOTAL, T3FREE, THYROIDAB in the last 72 hours.  Invalid input(s): FREET3 _____________  PERIPHERAL VASCULAR CATHETERIZATION  Result Date: 03/30/2021 Images from the original result were not included.  [id number] LOCATION:  FACILITY: [place] PHYSICIAN: [name], M.D. 03-May-1961 DATE OF PROCEDURE:  03/30/2021 DATE OF DISCHARGE: PV Angiogram/Intervention History obtained from chart review.[name] is a 60 y.o.  mildly overweight single, though lives with his girlfriend Ivin Booty, [place] male father of one child seen for peripheral vascular  disease because of claudication and diminished ABIs.  I last saw him in the office 02/12/2013.  Risk factors include continued tobacco abuse of one pack -2 pks per day, diabetes and hyperlipidemia. He was catheterized by Dr. Nicanor Bake 08/19/2012 demonstrated three-vessel disease. He ultimately underwent coronary artery bypass grafting x4 January 17 for LIMA to his LAD, a vein to the diagonal branch, ramus intermedius branch and to the RCA. He tolerated the procedure well. His EF was 50-55% by 2-D echo. Dopplers revealed ABIs of 0.5 and 0.7. He complaind of left greater than right claudication at one block. He underwent percutaneous revascularization using the Viance CTO catheter, diamondback orbital rotational atherectomy, chocolate balloon and IDEV Stent successfully to the mid Rt SFA. He enjoyed an excellent angiographic and clinical result with improvement Dopplers. He underwent an attempt at percutaneous revascularization of his long segment calcified left SFA CTO for lifestyle limiting claudication.I was unable to cross the lesion with a Viance  CTO catheter.the patient presents in followup. Continues to have left lower extremity claudication. Also continues to smoke. We discussed options including retrograde attempt at history catheterization by Dr. Brunetta Jeans in [place] versus femoropopliteal bypass grafting. Since she's had coronary bypass grafting, I suspect he does not have adequate venous conduits left to perform lower shimmy surgical room randomization and what was thought to require PTFE. I did send him to [facility] where Dr. Brunetta Jeans been 6 hours revascularizing a left SFA CTO successfully.  He is continue to smoke a pack a day.  Over the last several months he has noticed increasing left greater than right lower extremity lifestyle of any claudication with recent Doppler studies performed 03/16/2021 revealing a right ABI of 0.80 and a left of 0.74.  He does have a high-frequency signal in his left  SFA.  He wishes to proceed with outpatient endovascular therapy. Pre Procedure Diagnosis: Peripheral arterial disease Post Procedure Diagnosis: Peripheral arterial disease Operators: Dr. [name] Procedures Performed:  1.  Ultrasound-guided right common femoral access, fluoroscopically guided left common femoral access  2.  Abdominal aortogram/bilateral iliac angiogram/bifemoral runoff  3.  Orbital atherectomy left common iliac artery  4.  VBX covered stenting left common iliac artery             5, Mynx closure right common femoral artery PROCEDURE DESCRIPTION: The patient was brought to the second floor [facility] [facility] cath lab in the the postabsorptive state. He was premedicated with IV Versed and fentanyl. His right and left groins were prepped and shaved in usual sterile fashion. Xylocaine 1% was used for local anesthesia. A 5 French sheath was inserted into the right common femoral artery using standard Seldinger technique.  Ultrasound was used to identify the right common femoral artery and guide access.  A digital image of this was captured and placed the patient's chart.  A 5 French pigtail catheter was placed in the distal abdominal aorta.  Distal abdominal aortography, bilateral iliac angiography with bifemoral runoff was performed using bolus chase, digital subtraction and step table technique.  Omnipaque dye was used for the entirety of the case (total of 255 cc total administered to patient).  Retrograde aortic pressures monitored in the case. Angiographic Data: 1: Abdominal aorta-minimally aneurysmal 2: Left lower extremity-80% eccentric proximal calcified left common iliac artery stenosis, 40% segmental left extrailiac artery stenosis, 80% focal calcified proximal left SFA stenosis with a widely patent stent in the mid left SFA.  There is three-vessel runoff with 80% segmental proximal anterior tibial and posterior tibial stenoses. 3: Right lower extremity-50% segmental right extrailiac  artery stenosis.  Widely patent I DEV stent in the right SFA.  60 to 70% calcified right above-the-knee popliteal artery stenosis with two-vessel runoff.  The anterior tibial was occluded.  There was an 80% right tibioperoneal trunk stenosis.   Mr. Lombardo has a high-grade calcified proximal eccentric left common iliac artery stenosis most likely responsible for his left lower extremity claudication.  He does have a focal 80% stenosis which is calcified in the proximal left SFA as well.  Both SFA stents are patent with tibial vessel disease.  We will proceed with left SFA orbital atherectomy followed by VBX covered stenting. Procedure Description: Left common femoral access was obtained under fluoroscopic guidance because of heavy calcification with a 7 French Brite tip sheath.  Patient received a total of 9000 units of heparin with an ACT of 358.  He was already on aspirin and Plavix. I  crossed the lesion with a 3 5 versa core wire and exchanged through a 5 French endhole catheter for a Viper wire.  I then performed orbital atherectomy with a 2 mm solid burr performing multiple runs up to 120,000 RPMs.  Following this type exchanged back to a 035 versa core wire and performed VBX covered stenting with an 8 mm x 20 mm long VBX stent deployed at 12 atm resulting reduction of 80% eccentric calcified proximal left common iliac artery stenosis to 0% residual.  The patient tolerated procedure well.  The Brite tip sheath was exchanged over the wire for a short 7 [place] sheath which was then secured.  The 5 French sheath in the right common femoral artery was then removed and a Mynx closure device successfully deployed achieving hemostasis. Final Impression: Successful orbital atherectomy followed by VBX covered stenting of a high-grade calcified proximal left common iliac artery stenosis for lifestyle limiting claudication.  I did do a pullback gradient across the 40% segmental proximal left external iliac artery stenosis  with a 5 French endhole catheter after administration of 200 mcg of intra-arterial nitroglycerin that did not demonstrate a gradient.  I suspect that this will have addressed his left lower extremity claudication.  If not, we can always come back and address the focal calcified proximal left SFA stenosis.  The left common femoral sheath will be removed once the ACT falls below 170 and pressure held.  Patient will be hydrated overnight, and discharged home in the morning.  We will obtain lower extremity arterial Doppler studies in our [facility] line office next week and I will see him back 1 to 2 weeks thereafter.  He left the lab in stable condition. [name]. MD, [facility] 03/30/2021 10:55 AM    VAS [place] ABI WITH/WO TBI  Result Date: 03/17/2021  LOWER EXTREMITY DOPPLER STUDY Patient Name:  CATALINO PLASCENCIA  Date of Exam:   03/16/2021 Medical Rec #: [id number]      Accession #:    [id number] Date of Birth: [birth year]      Patient Gender: M Patient Age:   60 years Exam Location:  [facility] Procedure:      VAS [place] ABI WITH/WO TBI Referring Phys: Chrissie Noa HILTY --------------------------------------------------------------------------------  Indications: Peripheral artery disease, and h/o bilateral SFA stent placements.              Patient reports numbness in both feet and bilateral calf              claudication symptoms after walking about 15 minutes. High Risk Factors: Hypertension, hyperlipidemia, Diabetes, current smoker. Other Factors: CABG x 4.  Vascular Interventions: Successful Viance crossing of a long calcified mid right                         SFA CTO followed by diamondback orbital rotational                         arthrectomy and Debulking of the long calcified segment                         with distal protection, PTA using chocolate balloon and                         finally stenting using IDEV stent with excellent  angiographic result on 01/02/2013. Successful PTA and                          stenting of a left SFA 100% stenosis to less than 30%                         with a 5.5 x 150 Supera self expanding stent and                         successful mechanical thrombectomy and balloon                         angioplasty of a left peroneal 100% stenosis to                         approximately 50% residual at [facility] on                         02/24/2013. Comparison Study: In 12/2018, a lower arterial Doppler showed an ABI of .82 on                   the right and .86 on the left. Performing Technologist: Sharlett Iles RVT  Examination Guidelines: A complete evaluation includes at minimum, Doppler waveform signals and systolic blood pressure reading at the level of bilateral brachial, anterior tibial, and posterior tibial arteries, when vessel segments are accessible. Bilateral testing is considered an integral part of a complete examination. Photoelectric Plethysmograph (PPG) waveforms and toe systolic pressure readings are included as required and additional duplex testing as needed. Limited examinations for reoccurring indications may be performed as noted.  ABI Findings: +---------+------------------+-----+--------+--------+ Right    Rt Pressure (mmHg)IndexWaveformComment  +---------+------------------+-----+--------+--------+ Brachial 144                                     +---------+------------------+-----+--------+--------+ PTA      108               0.75 biphasic         +---------+------------------+-----+--------+--------+ PERO     115                    biphasic.80      +---------+------------------+-----+--------+--------+ DP       114               0.79 biphasicdampened +---------+------------------+-----+--------+--------+ Great Toe73                0.51 Normal           +---------+------------------+-----+--------+--------+ +---------+------------------+-----+--------+--------+ Left     Lt Pressure (mmHg)IndexWaveformComment   +---------+------------------+-----+--------+--------+ Brachial 140                                     +---------+------------------+-----+--------+--------+ PTA      99                0.69 biphasicdampened +---------+------------------+-----+--------+--------+ PERO     107                    biphasic.74      +---------+------------------+-----+--------+--------+ DP       99  0.69 biphasic         +---------+------------------+-----+--------+--------+ Great Toe74                0.51 Abnormal         +---------+------------------+-----+--------+--------+ +-------+-----------+-----------+------------+------------+ ABI/TBIToday's ABIToday's TBIPrevious ABIPrevious TBI +-------+-----------+-----------+------------+------------+ Right  .80        .51        .82         .48          +-------+-----------+-----------+------------+------------+ Left   .74        .51        .86         .63          +-------+-----------+-----------+------------+------------+  Right ABIs and bilateral TBIs appear essentially unchanged compared to prior study on 12/09/2018. Left ABIs appear decreased compared to prior study on 12/09/2018.  Summary: Right: Resting right ankle-brachial index indicates mild right lower extremity arterial disease. The right toe-brachial index is abnormal. Left: Resting left ankle-brachial index indicates moderate left lower extremity arterial disease. The left toe-brachial index is abnormal. ABIs decreased by .12.  *See table(s) above for measurements and observations. See LE Arterial duplex report. Vascular consult recommended. Electronically signed by Kathlyn Sacramento MD on 03/17/2021 at 11:51:56 AM.    Final    VAS [place] LOWER EXTREMITY ARTERIAL DUPLEX  Result Date: 03/17/2021 LOWER EXTREMITY ARTERIAL DUPLEX STUDY Patient Name:  [name]  Date of Exam:   03/16/2021 Medical Rec #: [id number]      Accession #:    [id number] Date of Birth: 04-13-1961       Patient Gender: M Patient Age:   55 years Exam Location:  [facility] Procedure:      VAS [place] LOWER EXTREMITY ARTERIAL DUPLEX Referring Phys: Chrissie Noa HILTY --------------------------------------------------------------------------------  Indications: Peripheral artery disease, and h/o bilateral SFA stent placements.              Patient reports numbness in both feet and bilateral calf              claudication symptoms after walking about 15 minutes. High Risk Factors: Hypertension, hyperlipidemia, Diabetes, current smoker.  Vascular Interventions: Successful Viance crossing of a long calcified mid right                         SFA CTO followed by diamondback orbital rotational                         arthrectomy and Debulking of the long calcified segment                         with distal protection, PTA using chocolate balloon and                         finally stenting using IDEV stent with excellent                         angiographic result on 01/02/2013. Successful PTA and                         stenting of a left SFA 100% stenosis to less than 30%                         with a 5.5 x  150 Supera self expanding stent and                         successful mechanical thrombectomy and balloon                         angioplasty of a left peroneal 100% stenosis to                         approximately 50% residual at [facility] on                         02/24/2013. Current ABI:            .80 on the right and .74 on the left. Comparison Study: In 12/2018, a lower arterial duplex showed 30-49% stenosis in                   the right TPT and 30-49% stenosis in the proximal SFA. Patent                   bilateral SFA stents without evidence of stenosis. Performing Technologist: Sharlett Iles RVT  Examination Guidelines: A complete evaluation includes B-mode imaging, spectral Doppler, color Doppler, and power Doppler as needed of all accessible portions of each vessel. Bilateral testing is considered an  integral part of a complete examination. Limited examinations for reoccurring indications may be performed as noted.  +----------+--------+-----+---------------+-----------+-----------------+ RIGHT     PSV cm/sRatioStenosis       Waveform   Comments          +----------+--------+-----+---------------+-----------+-----------------+ CFA Prox  157          30-49% stenosismultiphasiclow end range     +----------+--------+-----+---------------+-----------+-----------------+ DFA       158                         biphasic                     +----------+--------+-----+---------------+-----------+-----------------+ SFA Prox  139                         multiphasic                  +----------+--------+-----+---------------+-----------+-----------------+ POP Prox  89                          biphasic                     +----------+--------+-----+---------------+-----------+-----------------+ POP Mid   75                          biphasic                     +----------+--------+-----+---------------+-----------+-----------------+ POP Distal52                          biphasic                     +----------+--------+-----+---------------+-----------+-----------------+ TP Trunk  115          30-49% stenosisbiphasic   stable velocities +----------+--------+-----+---------------+-----------+-----------------+  Right Stent(s): +----------------------+--------+--------+--------+--------+ Proximal to Distal SFAPSV cm/sStenosisWaveformComments +----------------------+--------+--------+--------+--------+ Prox to  Stent         79              biphasic         +----------------------+--------+--------+--------+--------+ Proximal Stent        101             biphasic         +----------------------+--------+--------+--------+--------+ Mid Stent             116             biphasic         +----------------------+--------+--------+--------+--------+ Distal  Stent          104             biphasic         +----------------------+--------+--------+--------+--------+ Distal to Stent       88              biphasic         +----------------------+--------+--------+--------+--------+ Patent right proximal to distal SFA stent without evidence of stenosis.   +----------+--------+-----+---------------+---------+--------------------------+ LEFT      PSV cm/sRatioStenosis       Waveform Comments                   +----------+--------+-----+---------------+---------+--------------------------+ CFA Prox  171          30-49% stenosistriphasic                           +----------+--------+-----+---------------+---------+--------------------------+ DFA       142                         biphasic                            +----------+--------+-----+---------------+---------+--------------------------+ SFA Prox  443     7.1  75-99% stenosisbiphasic bulky calcified plaque,                                                   marked increase in                                                        velocities compared to                                                    prior exam,                +----------+--------+-----+---------------+---------+--------------------------+ POP Prox  48                          biphasic                            +----------+--------+-----+---------------+---------+--------------------------+ POP Mid   26  biphasic                            +----------+--------+-----+---------------+---------+--------------------------+ POP Distal35                          biphasic                            +----------+--------+-----+---------------+---------+--------------------------+ TP Trunk  72                          biphasic                            +----------+--------+-----+---------------+---------+--------------------------+ A focal velocity  elevation of 443 cm/s was obtained at proximal SFA distal to ostium with post stenotic turbulence with a VR of 7.1. Findings are characteristic of 75-99% stenosis.  Left Stent(s): +--------------------------+--------+--------+--------+--------+ Proximal/Mid to Distal SFAPSV cm/sStenosisWaveformComments +--------------------------+--------+--------+--------+--------+ Prox to Stent             45              biphasicdampened +--------------------------+--------+--------+--------+--------+ Proximal Stent            40              biphasicdampened +--------------------------+--------+--------+--------+--------+ Mid Stent                 50              biphasicdampened +--------------------------+--------+--------+--------+--------+ Distal Stent              47              biphasicdampened +--------------------------+--------+--------+--------+--------+ Distal to Stent           59              biphasic         +--------------------------+--------+--------+--------+--------+ Patient proximal/mid to distal SFA stent without evidence of stenosis.  Summary: Right: No significant change compared to previous study. 30-49% stenosis in the proximal CFA, low end range. Patent right proximal to distal SFA stent without evidence of stenosis. 30-49% stenosis in the TPT, based on 2:1 ratio.  Left: Severe progression is noted compared to previous study. 30-49% stenosis in the proximal CFA. 75-99% stenosis in the proximal SFA, distal to ostium. Patient proximal/mid to distal SFA stent without evidence of stenosis.  See table(s) above for measurements and observations. See ABI report. Vascular consult recommended. Electronically signed by Kathlyn Sacramento MD on 03/17/2021 at 11:53:42 AM.    Final    Disposition   Pt is being discharged home today in good condition.  Follow-up Plans & Appointments   Call [facility] at [phone] if any bleeding, swelling or drainage  at cath site.  May shower, no tub baths for 48 hours for groin sticks. No lifting over 5 pounds for 3 days.  No Driving for 3 days  Heart Healthy diabetic diet   Decrease tobacco use to 1 ppd.  Think of which ones you enjoy the most.    Call if any bleeding at cath site.   Follow-up Information     [facility] Follow up on 04/03/2021.   Specialty: Cardiology Why: at 10:00 AM for follow up lower ext dopplers Contact information: [address] [place] [phone]  Lorretta Harp, MD Follow up on 04/12/2021.   Specialties: Cardiology, Radiology Why: at 11:30 AM Contact information: [address] [place] [address] [phone]                  Discharge Medications   Allergies as of 03/31/2021   No Known Allergies      Medication List     TAKE these medications    aspirin 81 MG EC tablet Take 1 tablet (81 mg total) by mouth daily.   clopidogrel 75 MG tablet Commonly known as: PLAVIX Take 1 tablet (75 mg total) by mouth daily with breakfast.   cyclobenzaprine 10 MG tablet Commonly known as: FLEXERIL Take 1 tablet (10 mg total) by mouth 3 (three) times daily as needed for muscle spasms.   docusate sodium 100 MG capsule Commonly known as: COLACE Take 1 capsule (100 mg total) by mouth 2 (two) times daily. What changed:  when to take this reasons to take this   empagliflozin 10 MG Tabs tablet Commonly known as: Jardiance Take 10 mg by mouth daily. What changed: how much to take   gabapentin 300 MG capsule Commonly known as: NEURONTIN Take 300 mg by mouth at bedtime.   HumaLOG Mix 75/25 KwikPen (75-25) 100 UNIT/ML Kwikpen Generic drug: Insulin Lispro Prot & Lispro Inject 30-60 Units into the skin 2 (two) times daily. Patient takes 30 units in the morning and 60 units at night   icosapent Ethyl 1 g capsule Commonly known as: VASCEPA TAKE (2) CAPSULES BY MOUTH TWICE  DAILY.   isosorbide mononitrate 30 MG 24 hr tablet Commonly known as: IMDUR Take 1 tablet (30 mg total) by mouth daily.   Lantus SoloStar 100 UNIT/ML Solostar Pen Generic drug: insulin glargine Inject 30 Units into the skin 2 (two) times daily.   losartan 25 MG tablet Commonly known as: COZAAR TAKE ONE TABLET BY MOUTH ONCE DAILY.   metoprolol succinate 25 MG 24 hr tablet Commonly known as: TOPROL-XL TAKE 1 TABLET BY MOUTH ONCE A DAY.   Myrbetriq 25 MG Tb24 tablet Generic drug: mirabegron ER Take 25 mg by mouth daily.   OneTouch Delica Plus [id number] Misc Apply topically 2 (two) times daily.   OneTouch Delica Plus [id number] Misc Apply topically daily.   OneTouch Verio Flex System w/Device Kit   OneTouch Verio test strip Generic drug: glucose blood 2 (two) times daily.   oxyCODONE-acetaminophen 10-325 MG tablet Commonly known as: PERCOCET Take 1 tablet by mouth 4 (four) times daily as needed for pain.   ProAir HFA 108 (90 Base) MCG/ACT inhaler Generic drug: albuterol Inhale 2 puffs into the lungs every 6 (six) hours as needed for wheezing or shortness of breath.   rosuvastatin 40 MG tablet Commonly known as: CRESTOR Take 1 tablet (40 mg total) by mouth daily.   zolpidem 6.25 MG CR tablet Commonly known as: AMBIEN CR Take 6.25 mg by mouth at bedtime as needed for sleep.           Outstanding Labs/Studies   Consider BMP  Duration of Discharge Encounter   Greater than 30 minutes including physician time.  Signed, Cecilie Kicks, NP 03/31/2021, 10:09 AM

## 2021-03-31 NOTE — Progress Notes (Signed)
Progress Note  Patient Name: [name] Date of Encounter: 03/31/2021  [facility] Cardiologist: [name], MD   Subjective   No pain rt groin with no bleeding  Inpatient Medications    Scheduled Meds:  aspirin EC  81 mg Oral Daily   clopidogrel  75 mg Oral Q breakfast   empagliflozin  25 mg Oral Daily   gabapentin  300 mg Oral QHS   isosorbide mononitrate  30 mg Oral Daily   losartan  25 mg Oral Daily   metoprolol succinate  25 mg Oral Daily   rosuvastatin  40 mg Oral Daily   sodium chloride flush  3 mL Intravenous Q12H   sodium chloride flush  3 mL Intravenous Q12H   Continuous Infusions:  sodium chloride     PRN Meds: sodium chloride, acetaminophen, albuterol, hydrALAZINE, labetalol, morphine injection, ondansetron (ZOFRAN) IV, oxyCODONE-acetaminophen **AND** oxyCODONE, sodium chloride flush   Vital Signs    Vitals:   03/30/21 1535 03/30/21 1540 03/30/21 1545 03/30/21 1939  BP: 126/73 128/70 (!) 146/76 139/67  Pulse: 70 66 71 71  Resp:    18  Temp:    98.6 F (37 C)  TempSrc:    Oral  SpO2: 91% 92% 93% 90%  Weight:      Height:        Intake/Output Summary (Last 24 hours) at 03/31/2021 0816 Last data filed at 03/30/2021 1600 Gross per 24 hour  Intake 240 ml  Output 700 ml  Net -460 ml   Last 3 Weights 03/30/2021 03/28/2021 03/01/2021  Weight (lbs) 193 lb 193 lb 196 lb 3.2 oz  Weight (kg) 87.544 kg 87.544 kg 88.996 kg      Telemetry    SR - Personally Reviewed  ECG    No new - Personally Reviewed  Physical Exam   GEN: No acute distress.   Neck: No JVD Cardiac: RRR, no murmurs, rubs, or gallops. Rt groin no hematoma or bleeding, Lt groin no bleeding or hematoma Respiratory: wheezes thorough out, chronic to auscultation bilaterally. GI: Soft, nontender, non-distended  MS: No edema; No deformity. Neuro:  Nonfocal  Psych: Normal affect   Labs    High Sensitivity Troponin:  No results for input(s): TROPONINIHS in the last 720  hours.    Chemistry Recent Labs  Lab 03/28/21 1626 03/31/21 0216  NA 137 135  K 4.4 3.8  CL 98 101  CO2 21 26  GLUCOSE 157* 198*  BUN 15 12  CREATININE 0.99 0.97  CALCIUM 9.6 8.6*  GFRNONAA  --  >60  ANIONGAP  --  8     Hematology Recent Labs  Lab 03/28/21 1626 03/31/21 0216  WBC 8.1 8.0  RBC 5.23 4.64  HGB 15.9 14.1  HCT 46.3 42.2  MCV 89 90.9  MCH 30.4 30.4  MCHC 34.3 33.4  RDW 12.4 12.5  PLT 168 129*    BNPNo results for input(s): BNP, PROBNP in the last 168 hours.   DDimer No results for input(s): DDIMER in the last 168 hours.   Radiology    PERIPHERAL VASCULAR CATHETERIZATION  Result Date: 03/30/2021 Images from the original result were not included.  [id number] LOCATION:  FACILITY: [facility] PHYSICIAN: [name], M.D. Dec 12, 1960 DATE OF PROCEDURE:  03/30/2021 DATE OF DISCHARGE: PV Angiogram/Intervention History obtained from chart review.MERVYN PFLAUM is a 60 y.o.  mildly overweight single, though lives with his girlfriend Ivin Booty, [place] male father of one child seen for peripheral vascular disease because of claudication  and diminished ABIs.  I last saw him in the office 02/12/2013.  Risk factors include continued tobacco abuse of one pack -2 pks per day, diabetes and hyperlipidemia. He was catheterized by Dr. Nicanor Bake 08/19/2012 demonstrated three-vessel disease. He ultimately underwent coronary artery bypass grafting x4 January 17 for LIMA to his LAD, a vein to the diagonal branch, ramus intermedius branch and to the RCA. He tolerated the procedure well. His EF was 50-55% by 2-D echo. Dopplers revealed ABIs of 0.5 and 0.7. He complaind of left greater than right claudication at one block. He underwent percutaneous revascularization using the Viance CTO catheter, diamondback orbital rotational atherectomy, chocolate balloon and IDEV Stent successfully to the mid Rt SFA. He enjoyed an excellent angiographic and clinical result with improvement Dopplers. He underwent an  attempt at percutaneous revascularization of his long segment calcified left SFA CTO for lifestyle limiting claudication.I was unable to cross the lesion with a Viance CTO catheter.the patient presents in followup. Continues to have left lower extremity claudication. Also continues to smoke. We discussed options including retrograde attempt at history catheterization by Dr. Brunetta Jeans in [place] versus femoropopliteal bypass grafting. Since she's had coronary bypass grafting, I suspect he does not have adequate venous conduits left to perform lower shimmy surgical room randomization and what was thought to require PTFE. I did send him to [facility] where Dr. Brunetta Jeans been 6 hours revascularizing a left SFA CTO successfully.  He is continue to smoke a pack a day.  Over the last several months he has noticed increasing left greater than right lower extremity lifestyle of any claudication with recent Doppler studies performed 03/16/2021 revealing a right ABI of 0.80 and a left of 0.74.  He does have a high-frequency signal in his left SFA.  He wishes to proceed with outpatient endovascular therapy. Pre Procedure Diagnosis: Peripheral arterial disease Post Procedure Diagnosis: Peripheral arterial disease Operators: Dr. [name] Procedures Performed:  1.  Ultrasound-guided right common femoral access, fluoroscopically guided left common femoral access  2.  Abdominal aortogram/bilateral iliac angiogram/bifemoral runoff  3.  Orbital atherectomy left common iliac artery  4.  VBX covered stenting left common iliac artery             5, Mynx closure right common femoral artery PROCEDURE DESCRIPTION: The patient was brought to the second floor [facility] cath lab in the the postabsorptive state. He was premedicated with IV Versed and fentanyl. His right and left groins were prepped and shaved in usual sterile fashion. Xylocaine 1% was used for local anesthesia. A 5 French sheath was inserted into the right  common femoral artery using standard Seldinger technique.  Ultrasound was used to identify the right common femoral artery and guide access.  A digital image of this was captured and placed the patient's chart.  A 5 French pigtail catheter was placed in the distal abdominal aorta.  Distal abdominal aortography, bilateral iliac angiography with bifemoral runoff was performed using bolus chase, digital subtraction and step table technique.  Omnipaque dye was used for the entirety of the case (total of 255 cc total administered to patient).  Retrograde aortic pressures monitored in the case. Angiographic Data: 1: Abdominal aorta-minimally aneurysmal 2: Left lower extremity-80% eccentric proximal calcified left common iliac artery stenosis, 40% segmental left extrailiac artery stenosis, 80% focal calcified proximal left SFA stenosis with a widely patent stent in the mid left SFA.  There is three-vessel runoff with 80% segmental proximal anterior tibial and posterior tibial  stenoses. 3: Right lower extremity-50% segmental right extrailiac artery stenosis.  Widely patent I DEV stent in the right SFA.  60 to 70% calcified right above-the-knee popliteal artery stenosis with two-vessel runoff.  The anterior tibial was occluded.  There was an 80% right tibioperoneal trunk stenosis.   Mr. Lua has a high-grade calcified proximal eccentric left common iliac artery stenosis most likely responsible for his left lower extremity claudication.  He does have a focal 80% stenosis which is calcified in the proximal left SFA as well.  Both SFA stents are patent with tibial vessel disease.  We will proceed with left SFA orbital atherectomy followed by VBX covered stenting. Procedure Description: Left common femoral access was obtained under fluoroscopic guidance because of heavy calcification with a 7 French Brite tip sheath.  Patient received a total of 9000 units of heparin with an ACT of 358.  He was already on aspirin and Plavix.  I crossed the lesion with a 3 5 versa core wire and exchanged through a 5 French endhole catheter for a Viper wire.  I then performed orbital atherectomy with a 2 mm solid burr performing multiple runs up to 120,000 RPMs.  Following this type exchanged back to a 035 versa core wire and performed VBX covered stenting with an 8 mm x 20 mm long VBX stent deployed at 12 atm resulting reduction of 80% eccentric calcified proximal left common iliac artery stenosis to 0% residual.  The patient tolerated procedure well.  The Brite tip sheath was exchanged over the wire for a short 7 [place] sheath which was then secured.  The 5 French sheath in the right common femoral artery was then removed and a Mynx closure device successfully deployed achieving hemostasis. Final Impression: Successful orbital atherectomy followed by VBX covered stenting of a high-grade calcified proximal left common iliac artery stenosis for lifestyle limiting claudication.  I did do a pullback gradient across the 40% segmental proximal left external iliac artery stenosis with a 5 French endhole catheter after administration of 200 mcg of intra-arterial nitroglycerin that did not demonstrate a gradient.  I suspect that this will have addressed his left lower extremity claudication.  If not, we can always come back and address the focal calcified proximal left SFA stenosis.  The left common femoral sheath will be removed once the ACT falls below 170 and pressure held.  Patient will be hydrated overnight, and discharged home in the morning.  We will obtain lower extremity arterial Doppler studies in our [facility] line office next week and I will see him back 1 to 2 weeks thereafter.  He left the lab in stable condition. [name]. MD, [facility] 03/30/2021 10:55 AM     Cardiac Studies   Angiographic Data:    1: Abdominal aorta-minimally aneurysmal 2: Left lower extremity-80% eccentric proximal calcified left common iliac artery stenosis, 40% segmental  left extrailiac artery stenosis, 80% focal calcified proximal left SFA stenosis with a widely patent stent in the mid left SFA.  There is three-vessel runoff with 80% segmental proximal anterior tibial and posterior tibial stenoses. 3: Right lower extremity-50% segmental right extrailiac artery stenosis.  Widely patent I DEV stent in the right SFA.  60 to 70% calcified right above-the-knee popliteal artery stenosis with two-vessel runoff.  The anterior tibial was occluded.  There was an 80% right tibioperoneal trunk stenosis.   IMPRESSION: Mr. Maggio has a high-grade calcified proximal eccentric left common iliac artery stenosis most likely responsible for his left lower extremity claudication.  He does have a focal 80% stenosis which is calcified in the proximal left SFA as well.  Both SFA stents are patent with tibial vessel disease.  We will proceed with left SFA orbital atherectomy followed by VBX covered stenting.   Final Impression: Successful orbital atherectomy followed by VBX covered stenting of a high-grade calcified proximal left common iliac artery stenosis for lifestyle limiting claudication.  I did do a pullback gradient across the 40% segmental proximal left external iliac artery stenosis with a 5 French endhole catheter after administration of 200 mcg of intra-arterial nitroglycerin that did not demonstrate a gradient.  I suspect that this will have addressed his left lower extremity claudication.  If not, we can always come back and address the focal calcified proximal left SFA stenosis.  The left common femoral sheath will be removed once the ACT falls below 170 and pressure held.  Patient will be hydrated overnight, and discharged home in the morning.  We will obtain lower extremity arterial Doppler studies in our [facility] line office next week and I will see him back 1 to 2 weeks thereafter.  He left the lab in stable condition.     Patient Profile     60 y.o. male hx of PAD with tobacco,  diabetes, HTN, HLD, CAD with CABG X 4, 2014   (LIMA to LAD, SVG to Diagonal1, SVG to RAMUS INTERMEDIATE, and SVG to RCA), hx of Left SFA CTO.    Assessment & Plan    PAD/lifestyle limiting claudication and Rt ABI of 0.80 and left of 0.74 -kept overnight obs for Lt groin bruising.   S/p  Successful orbital atherectomy followed by VBX covered stenting of a high-grade calcified proximal left common iliac artery stenosis for lifestyle limiting claudication -kept overnight for groin oozing.  --on asa and plavix  CAD with hx of CABG and stable on BB, losartan and imdur  Tobacco use discussed importance of stopping -he will try to decrease to 1 PPD from 1.5  DM-2 stable.continue jardiance  HLD on crestor continue  Discharge today  For questions or updates, please contact [place] Please consult [email or web address] for contact info under        Signed, Cecilie Kicks, NP  03/31/2021, 8:16 AM

## 2021-03-31 NOTE — Discharge Instructions (Signed)
Call [facility] at [phone] if any bleeding, swelling or drainage at cath site.  May shower, no tub baths for 48 hours for groin sticks. No lifting over 5 pounds for 3 days.  No Driving for 3 days  Heart Healthy diabetic diet   Decrease tobacco use to 1 ppd.  Think of which ones you enjoy the most.    Call if any bleeding at cath site.

## 2021-04-03 ENCOUNTER — Other Ambulatory Visit: Payer: Self-pay | Admitting: Cardiovascular Disease

## 2021-04-03 ENCOUNTER — Ambulatory Visit (HOSPITAL_COMMUNITY)
Admission: RE | Admit: 2021-04-03 | Discharge: 2021-04-03 | Disposition: A | Discharge status: Home / Self Care | Payer: Medicare Other | Source: Ambulatory Visit | Attending: Cardiology | Admitting: Cardiology

## 2021-04-03 ENCOUNTER — Other Ambulatory Visit (HOSPITAL_COMMUNITY): Payer: Self-pay | Admitting: Cardiovascular Disease

## 2021-04-03 ENCOUNTER — Encounter (HOSPITAL_COMMUNITY): Payer: Self-pay

## 2021-04-03 ENCOUNTER — Other Ambulatory Visit: Payer: Self-pay

## 2021-04-03 DIAGNOSIS — Z95828 Presence of other vascular implants and grafts: Secondary | ICD-10-CM

## 2021-04-03 DIAGNOSIS — I739 Peripheral vascular disease, unspecified: Secondary | ICD-10-CM

## 2021-04-11 ENCOUNTER — Other Ambulatory Visit: Payer: Self-pay | Admitting: Internal Medicine

## 2021-04-12 ENCOUNTER — Other Ambulatory Visit: Payer: Self-pay

## 2021-04-12 ENCOUNTER — Encounter: Payer: Self-pay | Admitting: Cardiovascular Disease

## 2021-04-12 ENCOUNTER — Ambulatory Visit (INDEPENDENT_AMBULATORY_CARE_PROVIDER_SITE_OTHER): Payer: Medicare Other | Admitting: Cardiovascular Disease

## 2021-04-12 DIAGNOSIS — I739 Peripheral vascular disease, unspecified: Secondary | ICD-10-CM | POA: Diagnosis not present

## 2021-04-12 NOTE — Progress Notes (Signed)
04/12/2021 [name]   1961-06-28  [id number]  Primary Physician Pablo Lawrence, NP Primary Cardiologist: Lorretta Harp MD Lupe Carney, [place]  HPI:  [name] is a 60 y.o.  mildly overweight single, though lives with his girlfriend Ivin Booty, [place] male father of one child seen for peripheral vascular disease because of claudication and diminished ABIs.  I last saw him in the office 03/28/2021.  Risk factors include continued tobacco abuse of one pack -2 pks per day, diabetes and hyperlipidemia. He was catheterized by Dr. Nicanor Bake 08/19/2012 demonstrated three-vessel disease. He ultimately underwent coronary artery bypass grafting x4 January 17 for LIMA to his LAD, a vein to the diagonal branch, ramus intermedius branch and to the RCA. He tolerated the procedure well. His EF was 50-55% by 2-D echo. Dopplers revealed ABIs of 0.5 and 0.7. He complaind of left greater than right claudication at one block. He underwent percutaneous revascularization using the Viance CTO catheter, diamondback orbital rotational atherectomy, chocolate balloon and IDEV Stent successfully to the mid Rt SFA. He enjoyed an excellent angiographic and clinical result with improvement Dopplers. He underwent an attempt at percutaneous revascularization of his long segment calcified left SFA CTO for lifestyle limiting claudication.I was unable to cross the lesion with a Viance CTO catheter.the patient presents in followup. Continues to have left lower extremity claudication. Also continues to smoke. We discussed options including retrograde attempt at history catheterization by Dr. Brunetta Jeans in [place] versus femoropopliteal bypass grafting. Since she's had coronary bypass grafting, I suspect he does not have adequate venous conduits left to perform lower shimmy surgical room randomization and what was thought to require PTFE.  I did send him to [facility] where Dr. Brunetta Jeans been 6 hours revascularizing a left  SFA CTO successfully.  He is continue to smoke a pack a day.  Over the last several months he has noticed increasing left greater than right lower extremity lifestyle of any claudication with recent Doppler studies performed 03/16/2021 revealing a right ABI of 0.80 and a left of 0.74.  He does have a high-frequency signal in his left SFA.  He wishes to proceed with outpatient endovascular therapy.  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  He underwent orbital atherectomy followed by VBX covered stenting (8 mm x 29 mm).  He was discharged home the following day.  His Dopplers performed 04/03/2021 revealed a widely patent stent.  His claudication has resolved.  He does unfortunately continue to smoke.   Current Meds  Medication Sig   aspirin EC 81 MG EC tablet Take 1 tablet (81 mg total) by mouth daily.   Blood Glucose Monitoring Suppl (ONETOUCH VERIO FLEX SYSTEM) w/Device KIT    clopidogrel (PLAVIX) 75 MG tablet Take 1 tablet (75 mg total) by mouth daily with breakfast.   cyclobenzaprine (FLEXERIL) 10 MG tablet Take 1 tablet (10 mg total) by mouth 3 (three) times daily as needed for muscle spasms.   docusate sodium (COLACE) 100 MG capsule Take 1 capsule (100 mg total) by mouth 2 (two) times daily. (Patient taking differently: Take 100 mg by mouth daily as needed for mild constipation or moderate constipation.)   empagliflozin (JARDIANCE) 10 MG TABS tablet Take 10 mg by mouth daily. (Patient taking differently: Take 25 mg by mouth daily.)   gabapentin (NEURONTIN) 300 MG capsule Take 300 mg by mouth at bedtime.   HUMALOG MIX  75/25 KWIKPEN (75-25) 100 UNIT/ML Kwikpen Inject 30-60 Units into the skin 2 (two) times daily. Patient takes 30 units in the morning and 60 units at night   icosapent Ethyl (VASCEPA) 1 g capsule TAKE (2) CAPSULES BY MOUTH TWICE DAILY.   isosorbide mononitrate (IMDUR) 30 MG 24 hr  tablet Take 1 tablet (30 mg total) by mouth daily.   Lancets (ONETOUCH DELICA PLUS [id number]) MISC Apply topically daily.   Lancets (ONETOUCH DELICA PLUS [id number]) MISC Apply topically 2 (two) times daily.   LANTUS SOLOSTAR 100 UNIT/ML Solostar Pen Inject 30 Units into the skin 2 (two) times daily.   losartan (COZAAR) 25 MG tablet TAKE ONE TABLET BY MOUTH ONCE DAILY.   metoprolol succinate (TOPROL-XL) 25 MG 24 hr tablet TAKE 1 TABLET BY MOUTH ONCE A DAY.   MYRBETRIQ 25 MG TB24 tablet Take 25 mg by mouth daily.   ONETOUCH VERIO test strip 2 (two) times daily.   oxyCODONE-acetaminophen (PERCOCET) 10-325 MG tablet Take 1 tablet by mouth 4 (four) times daily as needed for pain.   PROAIR HFA 108 (90 BASE) MCG/ACT inhaler Inhale 2 puffs into the lungs every 6 (six) hours as needed for wheezing or shortness of breath.    rosuvastatin (CRESTOR) 40 MG tablet Take 1 tablet (40 mg total) by mouth daily.   zolpidem (AMBIEN CR) 6.25 MG CR tablet Take 6.25 mg by mouth at bedtime as needed for sleep.     No Known Allergies  Social History   Socioeconomic History   Marital status: Single    Spouse name: Not on file   Number of children: 1   Years of education: Not on file   Highest education level: Not on file  Occupational History   Occupation: disabled    Employer: DISABLED  Tobacco Use   Smoking status: Every Day    Packs/day: 1.00    Years: 39.00    Pack years: 39.00    Types: Cigarettes   Smokeless tobacco: Never  Vaping Use   Vaping Use: Never used  Substance and Sexual Activity   Alcohol use: Yes    Alcohol/week: 6.0 standard drinks    Types: 6 Cans of beer per week   Drug use: No   Sexual activity: Not Currently  Other Topics Concern   Not on file  Social History Narrative   Lives w/ girlfriend   Social Determinants of Health   Financial Resource Strain: Not on file  Food Insecurity: Not on file  Transportation Needs: Not on file  Physical Activity: Not on file  Stress:  Not on file  Social Connections: Not on file  Intimate Partner Violence: Not on file     Review of Systems: General: negative for chills, fever, night sweats or weight changes.  Cardiovascular: negative for chest pain, dyspnea on exertion, edema, orthopnea, palpitations, paroxysmal nocturnal dyspnea or shortness of breath Dermatological: negative for rash Respiratory: negative for cough or wheezing Urologic: negative for hematuria Abdominal: negative for nausea, vomiting, diarrhea, bright red blood per rectum, melena, or hematemesis Neurologic: negative for visual changes, syncope, or dizziness All other systems reviewed and are otherwise negative except as noted above.    Blood pressure 140/70, pulse 94, resp. rate 20, height 5' 11" (1.803 m), weight 189 lb 12.8 oz (86.1 kg), SpO2 95 %.  General appearance: alert and no distress Neck: no adenopathy, no carotid bruit, no JVD, supple, symmetrical, trachea midline, and thyroid not enlarged, symmetric, no tenderness/mass/nodules Lungs: clear to auscultation bilaterally Heart: regular  rate and rhythm, S1, S2 normal, no murmur, click, rub or gallop Extremities: extremities normal, atraumatic, no cyanosis or edema Pulses: 2+ and symmetric Skin: Skin color, texture, turgor normal. No rashes or lesions Neurologic: Grossly normal  EKG Not performed today  ASSESSMENT AND PLAN:   PVD (peripheral vascular disease) with claudication [facility]) [name] returns for postprocedure follow-up.  He has had multiple interventions in the past both on his right and left SFA by myself and Dr. Brunetta Jeans.  He has tibial vessel disease as well.  He was complaining of left lower extremity claudication.  I performed angiography on him 03/30/2021 revealing 80% calcified left common iliac artery stenosis, 80% focal proximal left SFA stenosis with a patent left SFA stent and three-vessel runoff although he did have tibial vessel disease.  I performed orbital  atherectomy followed by VBX covered stenting using an 8 mm x 29 mm stent with excellent result.  His follow-up Doppler studies performed 04/03/2021 revealed the stent to be widely patent.  His claudication has since resolved.  We will recheck lower extremity arterial Doppler studies in 6 months.     Lorretta Harp MD FACP,FACC,FAHA, [facility] 04/12/2021 12:19 PM

## 2021-04-12 NOTE — Patient Instructions (Signed)
Medication Instructions:  NO CHANGES *If you need a refill on your cardiac medications before your next appointment, please call your pharmacy*   Lab Work: NONE If you have labs (blood work) drawn today and your tests are completely normal, you will receive your results only by: [place] (if you have MyChart) OR A paper copy in the mail If you have any lab test that is abnormal or we need to change your treatment, we will call you to review the results.   Testing/Procedures: Your physician has requested that you have a lower or upper extremity arterial duplex. This test is an ultrasound of the arteries in the legs or arms. It looks at arterial blood flow in the legs and arms. Allow one hour for Lower and Upper Arterial scans. There are no restrictions or special instructions  LOWER AND ILIAC 6 MONTHS   Follow-Up: At [facility], you and your health needs are our priority.  As part of our continuing mission to provide you with exceptional heart care, we have created designated Provider Care Teams.  These Care Teams include your primary Cardiologist (physician) and Advanced Practice Providers (APPs -  Physician Assistants and Nurse Practitioners) who all work together to provide you with the care you need, when you need it.  We recommend signing up for the patient portal called "MyChart".  Sign up information is provided on this After Visit Summary.  MyChart is used to connect with patients for Virtual Visits (Telemedicine).  Patients are able to view lab/test results, encounter notes, upcoming appointments, etc.  Non-urgent messages can be sent to your provider as well.   To learn more about what you can do with MyChart, go to [email or web address].    Your next appointment:   12 year(s)  The format for your next appointment:   In Person  Provider:   [name], MD   Other Instructions NONE

## 2021-04-12 NOTE — Assessment & Plan Note (Signed)
Mr. Kubecka returns for postprocedure follow-up.  He has had multiple interventions in the past both on his right and left SFA by myself and Dr. Brunetta Jeans.  He has tibial vessel disease as well.  He was complaining of left lower extremity claudication.  I performed angiography on him 03/30/2021 revealing 80% calcified left common iliac artery stenosis, 80% focal proximal left SFA stenosis with a patent left SFA stent and three-vessel runoff although he did have tibial vessel disease.  I performed orbital atherectomy followed by VBX covered stenting using an 8 mm x 29 mm stent with excellent result.  His follow-up Doppler studies performed 04/03/2021 revealed the stent to be widely patent.  His claudication has since resolved.  We will recheck lower extremity arterial Doppler studies in 6 months.

## 2021-04-13 ENCOUNTER — Telehealth: Payer: Self-pay

## 2021-04-13 NOTE — Telephone Encounter (Signed)
   Name: [name]  DOB: October 09, 1960  MRN: [id number]   Primary Cardiologist: [name], MD  Chart reviewed as part of pre-operative protocol coverage. Patient was contacted 04/13/2021 in reference to pre-operative risk assessment for pending surgery as outlined below.  [name] was last seen on 04/12/21 by Dr. Judithann Sauger. He underwent orbital atherectomy with stent placement to left common iliac artery on 03/30/21. Per Dr. [name], he may not hold DAPT for 3 months.   I will route this recommendation to the requesting party via Epic fax function and remove from pre-op pool. Please call with questions.  [place], PA 04/13/2021, 2:45 PM

## 2021-04-13 NOTE — Telephone Encounter (Signed)
Dr. Andria Rhein saw this patient yesterday as a follow up to orbital atherectomy and stent placement to left CIA.   We are asked for surgical clearance and to hold ASA and plavix for trigger thumb release, date not yet scheduled. Should he wait a specific amount of time before holding DAPT? Is he cleared for surgery?

## 2021-04-13 NOTE — Telephone Encounter (Signed)
   [facility] Pre-operative Risk Assessment    Patient Name: [name]  DOB: [birth year] MRN: [id number]  HEARTCARE STAFF:  - IMPORTANT!!!!!! Under Visit Info/Reason for Call, type in Other and utilize the format Clearance MM/DD/YY or Clearance TBD. Do not use dashes or single digits. - Please review there is not already an duplicate clearance open for this procedure. - If request is for dental extraction, please clarify the # of teeth to be extracted. - If the patient is currently at the dentist's office, call Pre-Op Callback Staff (MA/nurse) to input urgent request.  - If the patient is not currently in the dentist office, please route to the Pre-Op pool.  Request for surgical clearance:  What type of surgery is being performed? Left Trigger Thumb Release  When is this surgery scheduled? TBD   What type of clearance is required (medical clearance vs. Pharmacy clearance to hold med vs. Both)? Both   Are there any medications that need to be held prior to surgery and how long? ASA & Plavix  Practice name and name of physician performing surgery? [facility]  What is the office phone number? [phone]   7.   What is the office fax number? [place]   8.   Anesthesia type (None, local, MAC, general) ? Choice    Jacqulynn Cadet 04/13/2021, 10:53 AM  _________________________________________________________________   (provider comments below)

## 2021-05-02 ENCOUNTER — Telehealth: Payer: Self-pay | Admitting: *Deleted

## 2021-05-02 ENCOUNTER — Other Ambulatory Visit: Payer: Self-pay | Admitting: Internal Medicine

## 2021-05-02 DIAGNOSIS — Z006 Encounter for examination for normal comparison and control in clinical research program: Secondary | ICD-10-CM

## 2021-05-02 NOTE — Telephone Encounter (Signed)
I called patient to ask him to sign medical release form for Coordinate Diabetes Study. Patient enrolled on March 2,2020 and we followed by phone every 3 months. [place] will follow every year up to 5 years. I will send form to patient in mail.

## 2021-05-10 DIAGNOSIS — M5137 Other intervertebral disc degeneration, lumbosacral region: Secondary | ICD-10-CM | POA: Diagnosis not present

## 2021-05-10 DIAGNOSIS — I739 Peripheral vascular disease, unspecified: Secondary | ICD-10-CM | POA: Diagnosis not present

## 2021-05-10 DIAGNOSIS — Z23 Encounter for immunization: Secondary | ICD-10-CM | POA: Diagnosis not present

## 2021-05-10 DIAGNOSIS — M87051 Idiopathic aseptic necrosis of right femur: Secondary | ICD-10-CM | POA: Diagnosis not present

## 2021-05-10 DIAGNOSIS — I700 Atherosclerosis of aorta: Secondary | ICD-10-CM | POA: Diagnosis not present

## 2021-05-10 DIAGNOSIS — R911 Solitary pulmonary nodule: Secondary | ICD-10-CM | POA: Diagnosis not present

## 2021-05-10 DIAGNOSIS — G4701 Insomnia due to medical condition: Secondary | ICD-10-CM | POA: Diagnosis not present

## 2021-05-15 ENCOUNTER — Other Ambulatory Visit: Payer: Self-pay | Admitting: Adult Health Nurse Practitioner

## 2021-05-15 ENCOUNTER — Other Ambulatory Visit (HOSPITAL_COMMUNITY): Payer: Self-pay | Admitting: Adult Health Nurse Practitioner

## 2021-05-15 DIAGNOSIS — R911 Solitary pulmonary nodule: Secondary | ICD-10-CM

## 2021-06-01 NOTE — Telephone Encounter (Signed)
I received the signed medical release form back from Aris Lot for Coordinate Diabetes Study.The form was signed Oct. 17,2022.

## 2021-06-15 ENCOUNTER — Other Ambulatory Visit: Payer: Self-pay

## 2021-06-15 ENCOUNTER — Ambulatory Visit (HOSPITAL_COMMUNITY)
Admission: RE | Admit: 2021-06-15 | Discharge: 2021-06-15 | Disposition: A | Discharge status: Home / Self Care | Payer: Medicare Other | Source: Ambulatory Visit | Attending: Adult Health Nurse Practitioner | Admitting: Adult Health Nurse Practitioner

## 2021-06-15 DIAGNOSIS — J439 Emphysema, unspecified: Secondary | ICD-10-CM | POA: Diagnosis not present

## 2021-06-15 DIAGNOSIS — R911 Solitary pulmonary nodule: Secondary | ICD-10-CM | POA: Diagnosis not present

## 2021-06-15 DIAGNOSIS — I700 Atherosclerosis of aorta: Secondary | ICD-10-CM | POA: Diagnosis not present

## 2021-06-15 DIAGNOSIS — R918 Other nonspecific abnormal finding of lung field: Secondary | ICD-10-CM | POA: Diagnosis not present

## 2021-06-16 ENCOUNTER — Other Ambulatory Visit: Payer: Medicare Other

## 2021-06-16 DIAGNOSIS — M545 Low back pain, unspecified: Secondary | ICD-10-CM | POA: Diagnosis not present

## 2021-06-16 DIAGNOSIS — M47816 Spondylosis without myelopathy or radiculopathy, lumbar region: Secondary | ICD-10-CM | POA: Diagnosis not present

## 2021-06-16 DIAGNOSIS — M25552 Pain in left hip: Secondary | ICD-10-CM | POA: Diagnosis not present

## 2021-06-19 ENCOUNTER — Other Ambulatory Visit: Payer: Self-pay | Admitting: Internal Medicine

## 2021-06-19 DIAGNOSIS — R918 Other nonspecific abnormal finding of lung field: Secondary | ICD-10-CM

## 2021-06-22 ENCOUNTER — Encounter: Payer: Self-pay | Admitting: Urology

## 2021-06-22 ENCOUNTER — Ambulatory Visit (INDEPENDENT_AMBULATORY_CARE_PROVIDER_SITE_OTHER): Payer: Medicare Other | Admitting: Urology

## 2021-06-22 ENCOUNTER — Other Ambulatory Visit: Payer: Self-pay

## 2021-06-22 VITALS — BP 155/81 | HR 76 | Temp 98.4°F

## 2021-06-22 DIAGNOSIS — R32 Unspecified urinary incontinence: Secondary | ICD-10-CM | POA: Diagnosis not present

## 2021-06-22 DIAGNOSIS — N400 Enlarged prostate without lower urinary tract symptoms: Secondary | ICD-10-CM

## 2021-06-22 DIAGNOSIS — R972 Elevated prostate specific antigen [PSA]: Secondary | ICD-10-CM | POA: Diagnosis not present

## 2021-06-22 MED ORDER — MYRBETRIQ 25 MG PO TB24: 25.0000 mg | ORAL_TABLET | Freq: Every day | ORAL | 3 refills | Status: DC

## 2021-06-22 NOTE — Progress Notes (Signed)
Subjective: 1. [name] without lower urinary tract symptoms   2. Elevated PSA   3. Enuresis     [name] is a 60 yo male who is sent in consultation by Dr. Cindie Laroche for an elevated PSA of 12.6 on 09/03/19.  Prior levels are not available. He is voiding well with an IPSS of 1.  He has nocturia x 1.  He has been on Myrbetriq 45m for an episode of enuresis 883mogo.   He has mild post void dribbling.  He has a history of CAD and has ED.   He has no history of UTI's or GU surgery.   He had no other new complaints at the time of the blood draw.   06/24/20: [name] today in f/u for his history of an elevated PSA with a negative biopsy on 12/25/19 by Dr. McAlyson Ingles He had a prostate volume of 2096m His PSA on 06/17/20 was 0.3.  His PSA on 12/16/19 was 0.1.    He is voiding well with an IPSS of 1.   He had COVID on 05/03/20 and was treated with the infusion and did well.  He remains on myrbetriq for his history of enuresis.  He is on chronic oxycodone and is on 5-10mg daily.    06/22/21: [name] today in f/u.   He is doing well on Myrbetriq 83m22mily with no enuresis.  He has had no hematuria or dysuria.  He hasn't had a recent PSA.  He remains on Oxycodone.    ROS:  ROS  No Known Allergies  Past Medical History:  Diagnosis Date   Arthritis    "some; in hips sometimes" (01/02/2013)   Back pain    "w/prolonged walking or standing" (01/02/2013)   CAD (coronary artery disease)    CABG X 4 08/2012   COPD (chronic obstructive pulmonary disease) (HCC)    Elevated PSA    Esophageal erosions 05/24/01   egd by Dr. RourGala RomneyERD (gastroesophageal reflux disease)    H/O hiatal hernia    History of gout    Hyperlipidemia    Hypertension    not on any medication for htn at present   PAD (peripheral artery disease) (HCC)[place] LEA DOPPLER, 11/11/2012 - moderate arterial insufficiency to lower extremities at rest, BILATERAL SFA-demonstrates occlusive disease with  reconstitution of flow   S/P percutaneous transluminal angioplasty (PTA) with stent placement 03/29/21 03/31/2021   Tobacco abuse    Tubular adenoma of colon 08/28/11   Type II diabetes mellitus (HCC)[place] Unintentional weight loss     Past Surgical History:  Procedure Laterality Date   ABDOMINAL AORTOGRAM W/LOWER EXTREMITY N/A 03/30/2021   Procedure: ABDOMINAL AORTOGRAM W/LOWER EXTREMITY;  Surgeon: BerrLorretta Harp;  [facility];  Service: Cardiovascular;  Laterality: N/A;   ANTERIOR CERVICAL DECOMP/DISCECTOMY FUSION  12/19/2011   Procedure: ANTERIOR CERVICAL DECOMPRESSION/DISCECTOMY FUSION 2 LEVELS;  Surgeon: ErneFloyce Stakes;  [facility]: [facility];  Service: Neurosurgery;  Laterality: N/A;  Cervical four-five Cervical five-six  Anterior cervical decompression/diskectomy, fusion, plate   ATHERECTOMY N/A 01/02/2013   Procedure: ATHERECTOMY;  Surgeon: JonaLorretta Harp;  [facility];  Service: Cardiovascular;  Laterality: N/A;   BACK SURGERY     x3   CARDIAC CATHETERIZATION  08/19/2012   CABG warranted; Severe ostial LCx stenosis with mid to distal vessel occlusion   COLONOSCOPY W/ POLYPECTOMY  08/28/11   Rourk-tubular  adenomas removed from ascending colon, suboptimal prep, diminutive rectal polyps   COLONOSCOPY WITH PROPOFOL N/A 06/13/2017   Procedure: COLONOSCOPY WITH PROPOFOL;  Surgeon: Daneil Dolin, MD;  [facility];  Service: Endoscopy;  Laterality: N/A;  9:15am   CORONARY ARTERY BYPASS GRAFT  08/22/2012   Procedure: CORONARY ARTERY BYPASS GRAFTING (CABG);  Surgeon: Grace Isaac, MD;  Location: [facility];  Service: Open Heart Surgery;  Laterality: N/A;  CABG x four,  using left internal mammary artery and right leg greater saphenous vein harvested endoscopically   ESOPHAGOGASTRODUODENOSCOPY  05/24/01   by Dr. Gala Romney   ESOPHAGOGASTRODUODENOSCOPY  08/28/11   Rourk-erosive reflux esophagitis, gastric erosions   ESOPHAGOGASTRODUODENOSCOPY  (EGD) WITH PROPOFOL N/A 06/13/2017   Procedure: ESOPHAGOGASTRODUODENOSCOPY (EGD) WITH PROPOFOL;  Surgeon: Daneil Dolin, MD;  [facility];  Service: Endoscopy;  Laterality: N/A;   INTRAOPERATIVE TRANSESOPHAGEAL ECHOCARDIOGRAM  08/22/2012   Procedure: INTRAOPERATIVE TRANSESOPHAGEAL ECHOCARDIOGRAM;  Surgeon: Grace Isaac, MD;  Location: [facility];  Service: Open Heart Surgery;  Laterality: N/A;   LEFT HEART CATHETERIZATION WITH CORONARY ANGIOGRAM N/A 08/19/2012   Procedure: LEFT HEART CATHETERIZATION WITH CORONARY ANGIOGRAM;  Surgeon: [name], MD;  [facility] LAB;  Service: Cardiovascular;  Laterality: N/A;   LOWER EXTREMITY ANGIOGRAM N/A 11/17/2012   Procedure: LOWER EXTREMITY ANGIOGRAM;  Surgeon: Lorretta Harp, MD;  Location: [facility] CATH LAB;  Service: Cardiovascular;  Laterality: N/A;   LOWER EXTREMITY ANGIOGRAM N/A 02/02/2013   Procedure: LOWER EXTREMITY ANGIOGRAM;  Surgeon: Lorretta Harp, MD;  Location: [facility] CATH LAB;  Service: Cardiovascular;  Laterality: N/A;   LUMBAR DISC SURGERY     LUMBAR FUSION  ~ 2011   PELVIC ABCESS DRAINAGE     x2   PERIPHERAL VASCULAR ATHERECTOMY  03/30/2021   Procedure: PERIPHERAL VASCULAR ATHERECTOMY;  Surgeon: Lorretta Harp, MD;  Location: [place] CV LAB;  Service: Cardiovascular;;  left common iliac artery   PERIPHERAL VASCULAR INTERVENTION  03/30/2021   Procedure: PERIPHERAL VASCULAR INTERVENTION;  Surgeon: Lorretta Harp, MD;  Location: [place] CV LAB;  Service: Cardiovascular;;   POLYPECTOMY  06/13/2017   Procedure: POLYPECTOMY;  Surgeon: Daneil Dolin, MD;  [facility];  Service: Endoscopy;;  colon   SHOULDER SURGERY Right    VASCULAR SURGERY Right 01/02/2013   diamondback orbital rotational  atherectomy, chocolate balloon and IDEV  Stent.    Social History   Socioeconomic History   Marital status: Single    Spouse name: Not on file   Number of children: 1   Years of education: Not on file    Highest education level: Not on file  Occupational History   Occupation: disabled    Employer: DISABLED  Tobacco Use   Smoking status: Every Day    Packs/day: 1.00    Years: 39.00    Pack years: 39.00    Types: Cigarettes   Smokeless tobacco: Never  Vaping Use   Vaping Use: Never used  Substance and Sexual Activity   Alcohol use: Yes    Alcohol/week: 6.0 standard drinks    Types: 6 Cans of beer per week   Drug use: No   Sexual activity: Not Currently  Other Topics Concern   Not on file  Social History Narrative   Lives w/ girlfriend   Social Determinants of Health   Financial Resource Strain: Not on file  Food Insecurity: Not on file  Transportation Needs: Not on file  Physical Activity: Not on file  Stress: Not on file  Social Connections: Not on file  Intimate Partner Violence: Not on file    Family History  Problem Relation Age of Onset   Anesthesia problems Neg Hx    Colon cancer Neg Hx    Gastric cancer Neg Hx    Esophageal cancer Neg Hx     Anti-infectives: Anti-infectives (From admission, onward)    None       Current Outpatient Medications  Medication Sig Dispense Refill   aspirin EC 81 MG EC tablet Take 1 tablet (81 mg total) by mouth daily.     Blood Glucose Monitoring Suppl (ONETOUCH VERIO FLEX SYSTEM) w/Device KIT      clopidogrel (PLAVIX) 75 MG tablet Take 1 tablet (75 mg total) by mouth daily with breakfast. 30 tablet 11   cyclobenzaprine (FLEXERIL) 10 MG tablet Take 1 tablet (10 mg total) by mouth 3 (three) times daily as needed for muscle spasms. 50 tablet 1   docusate sodium (COLACE) 100 MG capsule Take 1 capsule (100 mg total) by mouth 2 (two) times daily. (Patient taking differently: Take 100 mg by mouth daily as needed for mild constipation or moderate constipation.) 60 capsule 0   empagliflozin (JARDIANCE) 10 MG TABS tablet Take 10 mg by mouth daily. (Patient taking differently: Take 25 mg by mouth daily.) 90 tablet 3   gabapentin  (NEURONTIN) 300 MG capsule Take 300 mg by mouth at bedtime.     HUMALOG MIX 75/25 KWIKPEN (75-25) 100 UNIT/ML Kwikpen Inject 30-60 Units into the skin 2 (two) times daily. Patient takes 30 units in the morning and 60 units at night     icosapent Ethyl (VASCEPA) 1 g capsule TAKE (2) CAPSULES BY MOUTH TWICE DAILY. 120 capsule 11   isosorbide mononitrate (IMDUR) 30 MG 24 hr tablet Take 1 tablet (30 mg total) by mouth daily. 30 tablet 1   Lancets (ONETOUCH DELICA PLUS [id number]) MISC Apply topically daily.     Lancets (ONETOUCH DELICA PLUS CWCBJS28B) MISC Apply topically 2 (two) times daily.     LANTUS SOLOSTAR 100 UNIT/ML Solostar Pen Inject 30 Units into the skin 2 (two) times daily.     losartan (COZAAR) 25 MG tablet TAKE ONE TABLET BY MOUTH ONCE DAILY. 90 tablet 3   metoprolol succinate (TOPROL-XL) 25 MG 24 hr tablet TAKE 1 TABLET BY MOUTH ONCE A DAY. 90 tablet 3   ONETOUCH VERIO test strip 2 (two) times daily.     oxyCODONE-acetaminophen (PERCOCET) 10-325 MG tablet Take 1 tablet by mouth 4 (four) times daily as needed for pain.     PROAIR HFA 108 (90 BASE) MCG/ACT inhaler Inhale 2 puffs into the lungs every 6 (six) hours as needed for wheezing or shortness of breath.      rosuvastatin (CRESTOR) 40 MG tablet Take 1 tablet (40 mg total) by mouth daily. 30 tablet 1   zolpidem (AMBIEN CR) 6.25 MG CR tablet Take 6.25 mg by mouth at bedtime as needed for sleep.     MYRBETRIQ 25 MG TB24 tablet Take 1 tablet (25 mg total) by mouth daily. 90 tablet 3   No current facility-administered medications for this visit.     Objective: Vital signs in last 24 hours: BP (!) 155/81   Pulse 76   Temp 98.4 F (36.9 C)   Intake/Output from previous day: No intake/output data recorded. Intake/Output this shift: '@IOTHISSHIFT' @   Physical Exam Genitourinary:    Comments: AP/NST without lesions. Prostate 1.5+ Shaun. SV's non-palpable.  Lab Results:  Recent Results (from the past 2160 hour(s))  CBC      Status: None   Collection Time: 03/28/21  4:26 PM  Result Value Ref Range   WBC 8.1 3.4 - 10.8 x10E3/uL   RBC 5.23 4.14 - 5.80 x10E6/uL   Hemoglobin 15.9 13.0 - 17.7 g/dL   Hematocrit 46.3 37.5 - 51.0 %   MCV 89 79 - 97 fL   MCH 30.4 26.6 - 33.0 pg   MCHC 34.3 31.5 - 35.7 g/dL   RDW 12.4 11.6 - 15.4 %   Platelets 168 150 - 450 [id number]  Basic metabolic panel     Status: Abnormal   Collection Time: 03/28/21  4:26 PM  Result Value Ref Range   Glucose 157 (H) 65 - 99 mg/dL   BUN 15 6 - 24 mg/dL   Creatinine, Ser 0.99 0.76 - 1.27 mg/dL   eGFR 88 >59 mL/min/1.73   BUN/Creatinine Ratio 15 9 - 20   Sodium 137 134 - 144 mmol/L   Potassium 4.4 3.5 - 5.2 mmol/L   Chloride 98 96 - 106 mmol/L   CO2 21 20 - 29 mmol/L   Calcium 9.6 8.7 - 10.2 mg/dL  Glucose, capillary     Status: Abnormal   Collection Time: 03/30/21  8:08 AM  Result Value Ref Range   Glucose-Capillary 230 (H) 70 - 99 mg/dL    Comment: Glucose reference range applies only to samples taken after fasting for at least 8 hours.   Comment 1 QC Due   POCT Activated clotting time     Status: None   Collection Time: 03/30/21  9:52 AM  Result Value Ref Range   Activated Clotting Time 341 seconds  POCT Activated clotting time     Status: None   Collection Time: 03/30/21 10:12 AM  Result Value Ref Range   Activated Clotting Time 358 seconds  POCT Activated clotting time     Status: None   Collection Time: 03/30/21 10:36 AM  Result Value Ref Range   Activated Clotting Time 271 seconds  Glucose, capillary     Status: Abnormal   Collection Time: 03/30/21 10:54 AM  Result Value Ref Range   Glucose-Capillary 153 (H) 70 - 99 mg/dL    Comment: Glucose reference range applies only to samples taken after fasting for at least 8 hours.  POCT Activated clotting time     Status: None   Collection Time: 03/30/21 12:26 PM  Result Value Ref Range   Activated Clotting Time 190 seconds  POCT Activated clotting time     Status: None    Collection Time: 03/30/21  2:50 PM  Result Value Ref Range   Activated Clotting Time 132 seconds  Glucose, capillary     Status: Abnormal   Collection Time: 03/30/21  4:07 PM  Result Value Ref Range   Glucose-Capillary 131 (H) 70 - 99 mg/dL    Comment: Glucose reference range applies only to samples taken after fasting for at least 8 hours.  Basic metabolic panel      Status: Abnormal   Collection Time: 03/31/21  2:16 AM  Result Value Ref Range   Sodium 135 135 - 145 mmol/L   Potassium 3.8 3.5 - 5.1 mmol/L   Chloride 101 98 - 111 mmol/L   CO2 26 22 - 32 mmol/L   Glucose, Bld 198 (H) 70 - 99 mg/dL    Comment: Glucose reference range applies only to samples taken after fasting for at least 8  hours.   BUN 12 6 - 20 mg/dL   Creatinine, Ser 0.97 0.61 - 1.24 mg/dL   Calcium 8.6 (L) 8.9 - 10.3 mg/dL   GFR, Estimated >60 >60 mL/min    Comment: (NOTE) Calculated using the CKD-EPI Creatinine Equation (2021)    Anion gap 8 5 - 15    Comment: Performed at [facility] [address], [place], [place] [address]  CBC     Status: Abnormal   Collection Time: 03/31/21  2:16 AM  Result Value Ref Range   WBC 8.0 4.0 - 10.5 K/uL   RBC 4.64 4.22 - 5.81 MIL/uL   Hemoglobin 14.1 13.0 - 17.0 g/dL   HCT 42.2 39.0 - 52.0 %   MCV 90.9 80.0 - 100.0 fL   MCH 30.4 26.0 - 34.0 pg   MCHC 33.4 30.0 - 36.0 g/dL   RDW 12.5 11.5 - 15.5 %   Platelets 129 (L) 150 - 400 K/uL    Comment: REPEATED TO VERIFY   nRBC 0.0 0.0 - 0.2 %    Comment: Performed at [facility], [place] [address], [place], [place] [address]  Glucose, capillary     Status: Abnormal   Collection Time: 03/31/21  8:25 AM  Result Value Ref Range   Glucose-Capillary 272 (H) 70 - 99 mg/dL    Comment: Glucose reference range applies only to samples taken after fasting for at least 8 hours.  Glucose, capillary     Status: Abnormal   Collection Time: 03/31/21 11:29 AM  Result Value Ref Range   Glucose-Capillary 196 (H) 70 - 99 mg/dL     Comment: Glucose reference range applies only to samples taken after fasting for at least 8 hours.     Studies/Results: No results found.   Assessment/Plan: Elevated PSA of 12.6 with a Shaun exam and minimal LUTS.   His biopsy was negative and subsequent PSA's have been very low but we don't have one prior to this visit.   Since he is on chronic oxycodone, I will check a testosterone level to make sure that hypogonadism hasn't depressed the PSA.   PSA today and prior to f/u.  He will otherwise return in a year.   Enuresis .  He continues to do well on the Myrbetriq which I refilled.   ED with CAD.      Meds ordered this encounter  Medications   MYRBETRIQ 25 MG TB24 tablet    Sig: Take 1 tablet (25 mg total) by mouth daily.    Dispense:  90 tablet    Refill:  3      Orders Placed This Encounter  Procedures   Urinalysis, Routine w reflex microscopic   PSA, total and free   Testosterone   PSA, total and free    Standing Status:   Future    Standing Expiration Date:   06/22/2022     Return in about 1 year (around 06/22/2022) for with labs.    CC: Lucia Gaskins MD.     Irine Seal 06/22/2021 [phone]

## 2021-06-22 NOTE — Progress Notes (Signed)
Urological Symptom Review  Patient is experiencing the following symptoms: N/a   Review of Systems  Gastrointestinal (upper)  : Negative for upper GI symptoms  Gastrointestinal (lower) : Negative for lower GI symptoms  Constitutional : Negative for symptoms  Skin: Negative for skin symptoms  Eyes: Negative for eye symptoms  Ear/Nose/Throat : Negative for Ear/Nose/Throat symptoms  Hematologic/Lymphatic: Easy bruising  Cardiovascular : Negative for cardiovascular symptoms  Respiratory : Negative for respiratory symptoms  Endocrine: Negative for endocrine symptoms  Musculoskeletal: Back pain  Neurological: Negative for neurological symptoms  Psychologic: Negative for psychiatric symptoms

## 2021-06-23 LAB — URINALYSIS, ROUTINE W REFLEX MICROSCOPIC
Bilirubin, UA: NEGATIVE
Ketones, UA: NEGATIVE
Leukocytes,UA: NEGATIVE
Nitrite, UA: NEGATIVE
Protein,UA: NEGATIVE
Specific Gravity, UA: 1.01 (ref 1.005–1.030)
Urobilinogen, Ur: 0.2 mg/dL (ref 0.2–1.0)
pH, UA: 5 (ref 5.0–7.5)

## 2021-06-23 LAB — MICROSCOPIC EXAMINATION
Bacteria, UA: NONE SEEN
Epithelial Cells (non renal): NONE SEEN /hpf (ref 0–10)
Renal Epithel, UA: NONE SEEN /hpf
WBC, UA: NONE SEEN /hpf (ref 0–5)

## 2021-06-23 LAB — PSA, TOTAL AND FREE
PSA, Free Pct: 70 %
PSA, Free: 0.14 ng/mL
Prostate Specific Ag, Serum: 0.2 ng/mL (ref 0.0–4.0)

## 2021-06-23 LAB — TESTOSTERONE: Testosterone: 324 ng/dL (ref 264–916)

## 2021-07-06 ENCOUNTER — Encounter: Payer: Self-pay | Admitting: Internal Medicine

## 2021-07-11 ENCOUNTER — Other Ambulatory Visit (HOSPITAL_COMMUNITY): Payer: Self-pay | Admitting: Student

## 2021-07-11 ENCOUNTER — Other Ambulatory Visit: Payer: Self-pay | Admitting: Student

## 2021-07-11 DIAGNOSIS — M5416 Radiculopathy, lumbar region: Secondary | ICD-10-CM

## 2021-07-11 DIAGNOSIS — Z981 Arthrodesis status: Secondary | ICD-10-CM | POA: Diagnosis not present

## 2021-07-11 DIAGNOSIS — M5136 Other intervertebral disc degeneration, lumbar region: Secondary | ICD-10-CM | POA: Diagnosis not present

## 2021-07-11 DIAGNOSIS — R030 Elevated blood-pressure reading, without diagnosis of hypertension: Secondary | ICD-10-CM | POA: Diagnosis not present

## 2021-07-20 ENCOUNTER — Ambulatory Visit (HOSPITAL_COMMUNITY)
Admission: RE | Admit: 2021-07-20 | Discharge: 2021-07-20 | Disposition: A | Discharge status: Home / Self Care | Payer: Medicare Other | Source: Ambulatory Visit | Attending: Student | Admitting: Student

## 2021-07-20 ENCOUNTER — Other Ambulatory Visit: Payer: Self-pay

## 2021-07-20 DIAGNOSIS — M545 Low back pain, unspecified: Secondary | ICD-10-CM | POA: Diagnosis not present

## 2021-07-20 DIAGNOSIS — M5416 Radiculopathy, lumbar region: Secondary | ICD-10-CM | POA: Insufficient documentation

## 2021-07-20 MED ORDER — GADOBUTROL 1 MMOL/ML IV SOLN
9.0000 mL | Freq: Once | INTRAVENOUS | Status: AC | PRN
  Administered 2021-07-20: 9 mL via INTRAVENOUS

## 2021-08-14 DIAGNOSIS — E1100 Type 2 diabetes mellitus with hyperosmolarity without nonketotic hyperglycemic-hyperosmolar coma (NKHHC): Secondary | ICD-10-CM | POA: Diagnosis not present

## 2021-08-14 DIAGNOSIS — Z794 Long term (current) use of insulin: Secondary | ICD-10-CM | POA: Diagnosis not present

## 2021-08-14 DIAGNOSIS — Z79899 Other long term (current) drug therapy: Secondary | ICD-10-CM | POA: Diagnosis not present

## 2021-09-19 ENCOUNTER — Other Ambulatory Visit: Payer: Self-pay | Admitting: Neurosurgery

## 2021-09-19 DIAGNOSIS — G8929 Other chronic pain: Secondary | ICD-10-CM | POA: Diagnosis not present

## 2021-09-19 DIAGNOSIS — M5442 Lumbago with sciatica, left side: Secondary | ICD-10-CM | POA: Diagnosis not present

## 2021-09-20 DIAGNOSIS — M25562 Pain in left knee: Secondary | ICD-10-CM | POA: Diagnosis not present

## 2021-09-20 DIAGNOSIS — M545 Low back pain, unspecified: Secondary | ICD-10-CM | POA: Diagnosis not present

## 2021-09-25 ENCOUNTER — Inpatient Hospital Stay
Admission: RE | Admit: 2021-09-25 | Discharge: 2021-09-25 | Disposition: A | Discharge status: Home / Self Care | Payer: Medicare Other | Source: Ambulatory Visit | Attending: Neurosurgery | Admitting: Neurosurgery

## 2021-09-25 ENCOUNTER — Other Ambulatory Visit: Payer: Medicare Other

## 2021-09-27 ENCOUNTER — Other Ambulatory Visit: Payer: Self-pay

## 2021-09-27 ENCOUNTER — Ambulatory Visit (HOSPITAL_COMMUNITY)
Admission: RE | Admit: 2021-09-27 | Discharge: 2021-09-27 | Disposition: A | Discharge status: Home / Self Care | Payer: Medicare Other | Source: Ambulatory Visit | Attending: Cardiovascular Disease | Admitting: Cardiovascular Disease

## 2021-09-27 ENCOUNTER — Ambulatory Visit (HOSPITAL_BASED_OUTPATIENT_CLINIC_OR_DEPARTMENT_OTHER)
Admission: RE | Admit: 2021-09-27 | Discharge: 2021-09-27 | Disposition: A | Discharge status: Home / Self Care | Payer: Medicare Other | Source: Ambulatory Visit | Attending: Cardiovascular Disease | Admitting: Cardiovascular Disease

## 2021-09-27 DIAGNOSIS — Z95828 Presence of other vascular implants and grafts: Secondary | ICD-10-CM | POA: Insufficient documentation

## 2021-09-27 DIAGNOSIS — I739 Peripheral vascular disease, unspecified: Secondary | ICD-10-CM | POA: Diagnosis not present

## 2021-09-29 ENCOUNTER — Encounter: Payer: Self-pay | Admitting: Internal Medicine

## 2021-10-02 ENCOUNTER — Other Ambulatory Visit: Payer: Medicare Other

## 2021-10-10 ENCOUNTER — Other Ambulatory Visit: Payer: Self-pay | Admitting: Internal Medicine

## 2021-10-10 DIAGNOSIS — I739 Peripheral vascular disease, unspecified: Secondary | ICD-10-CM

## 2021-10-10 DIAGNOSIS — Z95828 Presence of other vascular implants and grafts: Secondary | ICD-10-CM

## 2021-11-13 DIAGNOSIS — E1165 Type 2 diabetes mellitus with hyperglycemia: Secondary | ICD-10-CM | POA: Diagnosis not present

## 2021-11-13 DIAGNOSIS — I700 Atherosclerosis of aorta: Secondary | ICD-10-CM | POA: Diagnosis not present

## 2021-11-13 DIAGNOSIS — I25118 Atherosclerotic heart disease of native coronary artery with other forms of angina pectoris: Secondary | ICD-10-CM | POA: Diagnosis not present

## 2021-11-13 DIAGNOSIS — Z794 Long term (current) use of insulin: Secondary | ICD-10-CM | POA: Diagnosis not present

## 2021-11-13 DIAGNOSIS — F17219 Nicotine dependence, cigarettes, with unspecified nicotine-induced disorders: Secondary | ICD-10-CM | POA: Diagnosis not present

## 2021-11-13 DIAGNOSIS — M5137 Other intervertebral disc degeneration, lumbosacral region: Secondary | ICD-10-CM | POA: Diagnosis not present

## 2021-11-13 DIAGNOSIS — H01116 Allergic dermatitis of left eye, unspecified eyelid: Secondary | ICD-10-CM | POA: Diagnosis not present

## 2021-12-11 DIAGNOSIS — E119 Type 2 diabetes mellitus without complications: Secondary | ICD-10-CM | POA: Diagnosis not present

## 2022-01-19 ENCOUNTER — Other Ambulatory Visit: Payer: Self-pay | Admitting: Internal Medicine

## 2022-02-12 ENCOUNTER — Telehealth: Payer: Self-pay | Admitting: Internal Medicine

## 2022-02-12 DIAGNOSIS — F17219 Nicotine dependence, cigarettes, with unspecified nicotine-induced disorders: Secondary | ICD-10-CM | POA: Diagnosis not present

## 2022-02-12 DIAGNOSIS — M79604 Pain in right leg: Secondary | ICD-10-CM

## 2022-02-12 DIAGNOSIS — Z794 Long term (current) use of insulin: Secondary | ICD-10-CM | POA: Diagnosis not present

## 2022-02-12 DIAGNOSIS — E782 Mixed hyperlipidemia: Secondary | ICD-10-CM | POA: Diagnosis not present

## 2022-02-12 DIAGNOSIS — I739 Peripheral vascular disease, unspecified: Secondary | ICD-10-CM

## 2022-02-12 DIAGNOSIS — Z0000 Encounter for general adult medical examination without abnormal findings: Secondary | ICD-10-CM | POA: Diagnosis not present

## 2022-02-12 DIAGNOSIS — E1100 Type 2 diabetes mellitus with hyperosmolarity without nonketotic hyperglycemic-hyperosmolar coma (NKHHC): Secondary | ICD-10-CM | POA: Diagnosis not present

## 2022-02-12 DIAGNOSIS — R252 Cramp and spasm: Secondary | ICD-10-CM | POA: Diagnosis not present

## 2022-02-12 DIAGNOSIS — E1165 Type 2 diabetes mellitus with hyperglycemia: Secondary | ICD-10-CM | POA: Diagnosis not present

## 2022-02-12 NOTE — Telephone Encounter (Signed)
Pt c/o swelling: STAT is pt has developed SOB within 24 hours  If swelling, where is the swelling located?  Both legs  How much weight have you gained and in what time span?   No  Have you gained 3 pounds in a day or 5 pounds in a week? o  Do you have a log of your daily weights (if so, list)?   No  Are you currently taking a fluid pill?   No  Are you currently SOB?   No  Have you traveled recently?    No   Patient called stating he is having swelling in both legs and pain in the calves.

## 2022-02-12 NOTE — Telephone Encounter (Signed)
Patient aware MD advised to repeat LEAs now. Staff message sent to scheduler

## 2022-02-12 NOTE — Telephone Encounter (Signed)
Spoke with patient of Dr Debara Pickett - he reports bilateral calf pain when walking, left ankle swell. He said this feels very similar to last year, prior to his PV angiogram/stent. He had dopplers Feb 2023. Routed to Dr. Debara Pickett to review

## 2022-02-12 NOTE — Telephone Encounter (Signed)
Ok to repeat the LE arterial dopplers now. Thanks.  -[name]

## 2022-02-20 ENCOUNTER — Ambulatory Visit (HOSPITAL_COMMUNITY)
Admission: RE | Admit: 2022-02-20 | Discharge: 2022-02-20 | Disposition: A | Discharge status: Home / Self Care | Payer: Medicare Other | Source: Ambulatory Visit | Attending: Cardiology | Admitting: Cardiology

## 2022-02-20 DIAGNOSIS — I739 Peripheral vascular disease, unspecified: Secondary | ICD-10-CM | POA: Diagnosis not present

## 2022-02-21 ENCOUNTER — Telehealth: Payer: Self-pay | Admitting: Internal Medicine

## 2022-02-21 NOTE — Telephone Encounter (Signed)
Spoke to patient, advised once reviewed by MD will call with results.  Patient verbalized understanding.

## 2022-02-21 NOTE — Telephone Encounter (Signed)
Pt is calling in regards to results for test done 07/18. Requesting a call back.

## 2022-02-22 NOTE — Telephone Encounter (Signed)
Returned call to pt and notified that Dr [name] has not reviewed at this time we will let him know as soon as we get the results.

## 2022-02-22 NOTE — Telephone Encounter (Signed)
Patient is following up, again requesting to discuss results. 

## 2022-02-26 NOTE — Telephone Encounter (Signed)
Patient called to follow-up on his test results.   Pt c/o swelling: STAT is pt has developed SOB within 24 hours  If swelling, where is the swelling located?  Left ankle and both legs  How much weight have you gained and in what time span?   No  Have you gained 3 pounds in a day or 5 pounds in a week?  No  Do you have a log of your daily weights (if so, list)? No  Are you currently taking a fluid pill? Yes  Are you currently SOB?   A little  Have you traveled recently?   No   Patient stated he is concern his legs are still swelling and his calves are still hurting when he walks.

## 2022-02-26 NOTE — Telephone Encounter (Signed)
Patient wants to know results of USLE and ABI. He reports bilateral calf pain, esp with ambulation. He has new onset sob since last week. Lt ankle edematous. Has stents in legs. Dr. Gardiner Rhyme (DOD) ordered for patient to be seen in clinic this week. Appointment with Thomasene Mohair, FNP for 8/25.

## 2022-02-27 ENCOUNTER — Ambulatory Visit (INDEPENDENT_AMBULATORY_CARE_PROVIDER_SITE_OTHER): Payer: Medicare Other | Admitting: General Practice

## 2022-02-27 ENCOUNTER — Encounter: Payer: Self-pay | Admitting: General Practice

## 2022-02-27 VITALS — BP 144/72 | Ht 71.0 in | Wt 194.8 lb

## 2022-02-27 DIAGNOSIS — E782 Mixed hyperlipidemia: Secondary | ICD-10-CM | POA: Diagnosis not present

## 2022-02-27 DIAGNOSIS — M79605 Pain in left leg: Secondary | ICD-10-CM | POA: Diagnosis not present

## 2022-02-27 DIAGNOSIS — M79604 Pain in right leg: Secondary | ICD-10-CM

## 2022-02-27 DIAGNOSIS — Z720 Tobacco use: Secondary | ICD-10-CM

## 2022-02-27 DIAGNOSIS — I739 Peripheral vascular disease, unspecified: Secondary | ICD-10-CM | POA: Diagnosis not present

## 2022-02-27 MED ORDER — ISOSORBIDE MONONITRATE ER 60 MG PO TB24: 60.0000 mg | ORAL_TABLET | Freq: Every day | ORAL | 3 refills | Status: DC

## 2022-02-27 NOTE — Patient Instructions (Signed)
Medication Instructions:  INCREASE ISOSORBIDE '60MG'$  DAILY  *If you need a refill on your cardiac medications before your next appointment, please call your pharmacy*  Lab Work:   Testing/Procedures:  NONE    NONE If you have labs (blood work) drawn today and your tests are completely normal, you will receive your results only by: [place] (if you have MyChart) OR  A paper copy in the mail If you have any lab test that is abnormal or we need to change your treatment, we will call you to review the results.  You may also go to any of these [facility] locations:  [place] [id number],  [place] [address] ([facility])  [address]  [place] [address] On[facility])  Special Instructions INCREASE YOUR WALKING AS TOLERATED  SMOKING CESSATION-SEE FLYER  Follow-Up: Your next appointment:  03/19/2022 at 10:30 AM  In Person with DR BERRY  At [facility], you and your health needs are our priority.  As part of our continuing mission to provide you with exceptional heart care, we have created designated Provider Care Teams.  These Care Teams include your primary Cardiologist (physician) and Advanced Practice Providers (APPs -  Physician Assistants and Nurse Practitioners) who all work together to provide you with the care you need, when you need it.  Important Information About Sugar

## 2022-02-27 NOTE — Telephone Encounter (Signed)
Reviewed LE arterial dopplers - this appears stable compared to prior exam - nothing to fix at this point. Repeat dopplers were recommended in 1 year. Suspect he will need additional diuretic at follow-up with Denyse Amass.  Dr. Lemmie Evens

## 2022-02-27 NOTE — Progress Notes (Signed)
Cardiology Clinic Note   Patient Name: [name] Date of Encounter: 02/27/2022  Primary Care Provider:  Pablo Lawrence, NP Primary Cardiologist:  [name], MD  Patient Profile    [name] presents to the clinic today for follow-up evaluation of his peripheral vascular disease.  Past Medical History    Past Medical History:  Diagnosis Date   Arthritis    "some; in hips sometimes" (01/02/2013)   Back pain    "w/prolonged walking or standing" (01/02/2013)   CAD (coronary artery disease)    CABG X 4 08/2012   COPD (chronic obstructive pulmonary disease) (HCC)    Elevated PSA    Esophageal erosions 05/24/01   egd by Dr. Gala Romney   GERD (gastroesophageal reflux disease)    H/O hiatal hernia    History of gout    Hyperlipidemia    Hypertension    not on any medication for htn at present   PAD (peripheral artery disease) ([place])    LEA DOPPLER, 11/11/2012 - moderate arterial insufficiency to lower extremities at rest, BILATERAL SFA-demonstrates occlusive disease with reconstitution of flow   S/P percutaneous transluminal angioplasty (PTA) with stent placement 03/29/21 03/31/2021   Tobacco abuse    Tubular adenoma of colon 08/28/11   Type II diabetes mellitus ([place])    Unintentional weight loss    Past Surgical History:  Procedure Laterality Date   ABDOMINAL AORTOGRAM W/LOWER EXTREMITY N/A 03/30/2021   Procedure: ABDOMINAL AORTOGRAM W/LOWER EXTREMITY;  Surgeon: Lorretta Harp, MD;  Location: [place] [facility];  Service: Cardiovascular;  Laterality: N/A;   ANTERIOR CERVICAL DECOMP/DISCECTOMY FUSION  12/19/2011   Procedure: ANTERIOR CERVICAL DECOMPRESSION/DISCECTOMY FUSION 2 LEVELS;  Surgeon: Floyce Stakes, MD;  [facility]: [facility];  Service: Neurosurgery;  Laterality: N/A;  Cervical four-five Cervical five-six  Anterior cervical decompression/diskectomy, fusion, plate   ATHERECTOMY N/A 01/02/2013   Procedure: ATHERECTOMY;  Surgeon: Lorretta Harp, MD;  Location:  [facility] C[facility];  Service: Cardiovascular;  Laterality: N/A;   BACK SURGERY     x3   CARDIAC CATHETERIZATION  08/19/2012   CABG warranted; Severe ostial LCx stenosis with mid to distal vessel occlusion   COLONOSCOPY W/ POLYPECTOMY  08/28/11   Rourk-tubular adenomas removed from ascending colon, suboptimal prep, diminutive rectal polyps   COLONOSCOPY WITH PROPOFOL N/A 06/13/2017   Procedure: COLONOSCOPY WITH PROPOFOL;  Surgeon: Daneil Dolin, MD;  [facility];  Service: Endoscopy;  Laterality: N/A;  9:15am   CORONARY ARTERY BYPASS GRAFT  08/22/2012   Procedure: CORONARY ARTERY BYPASS GRAFTING (CABG);  Surgeon: Grace Isaac, MD;  Location: [place];  Service: Open Heart Surgery;  Laterality: N/A;  CABG x four,  using left internal mammary artery and right leg greater saphenous vein harvested endoscopically   ESOPHAGOGASTRODUODENOSCOPY  05/24/01   by Dr. Gala Romney   ESOPHAGOGASTRODUODENOSCOPY  08/28/11   Rourk-erosive reflux esophagitis, gastric erosions   ESOPHAGOGASTRODUODENOSCOPY (EGD) WITH PROPOFOL N/A 06/13/2017   Procedure: ESOPHAGOGASTRODUODENOSCOPY (EGD) WITH PROPOFOL;  Surgeon: Daneil Dolin, MD;  [facility];  Service: Endoscopy;  Laterality: N/A;   INTRAOPERATIVE TRANSESOPHAGEAL ECHOCARDIOGRAM  08/22/2012   Procedure: INTRAOPERATIVE TRANSESOPHAGEAL ECHOCARDIOGRAM;  Surgeon: Grace Isaac, MD;  Location: [place];  Service: Open Heart Surgery;  Laterality: N/A;   LEFT HEART CATHETERIZATION WITH CORONARY ANGIOGRAM N/A 08/19/2012   Procedure: LEFT HEART CATHETERIZATION WITH CORONARY ANGIOGRAM;  Surgeon: [name], MD;  Location: [facility] CATH LAB;  Service: Cardiovascular;  Laterality: N/A;  LOWER EXTREMITY ANGIOGRAM N/A 11/17/2012   Procedure: LOWER EXTREMITY ANGIOGRAM;  Surgeon: Lorretta Harp, MD;  Location: [facility] CATH LAB;  Service: Cardiovascular;  Laterality: N/A;   LOWER EXTREMITY ANGIOGRAM N/A 02/02/2013   Procedure: LOWER EXTREMITY ANGIOGRAM;  Surgeon: Lorretta Harp, MD;  Location: [facility] CATH LAB;  Service: Cardiovascular;  Laterality: N/A;   LUMBAR DISC SURGERY     LUMBAR FUSION  ~ 2011   PELVIC ABCESS DRAINAGE     x2   PERIPHERAL VASCULAR ATHERECTOMY  03/30/2021   Procedure: PERIPHERAL VASCULAR ATHERECTOMY;  Surgeon: Lorretta Harp, MD;  Location: [place] [facility];  Service: Cardiovascular;;  left common iliac artery   PERIPHERAL VASCULAR INTERVENTION  03/30/2021   Procedure: PERIPHERAL VASCULAR INTERVENTION;  Surgeon: Lorretta Harp, MD;  Location: [place] [facility];  Service: Cardiovascular;;   POLYPECTOMY  06/13/2017   Procedure: POLYPECTOMY;  Surgeon: Daneil Dolin, MD;  [facility];  Service: Endoscopy;;  colon   SHOULDER SURGERY Right    VASCULAR SURGERY Right 01/02/2013   diamondback orbital rotational  atherectomy, chocolate balloon and IDEV  Stent.    Allergies  No Known Allergies  History of Present Illness    [name] is a PMH of hyperlipidemia, essential hypertension, coronary artery disease status post CABG x4 (08/2015 LIMA-LAD, SVG-diagonal, SVG-ramus intermedius, and SVG-RCA), type 2 diabetes, tobacco abuse, and peripheral vascular disease status post transluminal angioplasty with stenting 03/29/2021.  LVEF by echocardiogram 50-55%.  His lower extremity Dopplers showed ABIs of 0.5 and 0.7.  He reported left greater than right claudication.  He underwent percutaneous revascularization with diamondback orbital rotational arthrectomy and was successfully stented to his mid right SFA.  He was noted to have improvement in his lower extremity Dopplers.  He underwent attempted percutaneous revascularization of his long segment calcified left SFA with CTO for lifestyle limiting claudication.  The lesion was unable to be crossed.  On follow-up he continued to have left lower extremity claudication.  He continues to smoke.  Options for treatment including retrograde attempt by Dr. [name] of neurology versus femoral-popliteal  bypass grafting were discussed.  It was felt that because of his coronary artery bypass grafting he did not have adequate venous conduits and it was felt that PTFE would be more appropriate.  He was seen at [facility] by Dr. [name] and underwent 6-hour revascularization of his left SFA for CTO.  His surgery was successful.  He continued to smoke a pack per day.  He reported that over the last several months he noted increasing left greater than right lower extremity lifestyle limiting claudication.  His Doppler studies 03/16/2021 showed a right ABI of 0.8 and a left of 0.74.  He was noted to have a high-frequency signal in his left SFA.  He wished to proceed with outpatient endovascular therapy.  Underwent peripheral angiography by Dr. [name] on 03/30/2021 which showed patent SFA stents with a high-grade calcified left common iliac artery stenosis.  He underwent orbital arthrectomy followed by VBX stenting.  He was discharged the next day.  His lower extremity Dopplers 04/03/2021 showed widely patent stent.  His claudication had resolved.  He continues to smoke.  He underwent ABI 02/20/2022 which showed right ABI is a decrease by 0.12 compared to his previous study and left ABIs were unchanged.  Recommendation for repeat study in 12 months was made.  His lower extremity arterial studies showed greater than 50% stenosis of his right external iliac artery 50-74  percent stenosis of the above-knee popliteal artery, patent stent of his mid-distal SFA.  On the left he was noted to have 30-49% stenosis in the common femoral artery and 75-99% stenosis in the proximal superficial femoral artery.  A patent stent was noted in the mid-distal SFA with no significant changes from his previous exam.  Repeat study in 12 months was recommended first vascular consult for further evaluation.  He presents to the clinic today for follow-up evaluation states he has noticed increased lower extremity pain with ambulation.  He reports  that he is able to walk around 30 feet before he needs to stop and rest.  We reviewed his ABIs and lower extremity arterial's.  He expressed understanding.  He presents with his wife who also expressed understanding.  He continues to smoke about a pack and a half of cigarettes per day.  Smoking cessation strongly encouraged.  I will increase his Imdur to 60 mg daily, have him increase his walking as tolerated, and follow-up with Dr. [name] on the 14th.  Today he denies chest pain, shortness of breath,  fatigue, palpitations, melena, hematuria, hemoptysis, diaphoresis, weakness, presyncope, syncope, orthopnea, and PND.     Home Medications    Prior to Admission medications   Medication Sig Start Date End Date Taking? Authorizing Provider  aspirin EC 81 MG EC tablet Take 1 tablet (81 mg total) by mouth daily. 09/11/18   Orson Eva, MD  Blood Glucose Monitoring Suppl ([place]) w/Device KIT  11/21/20   [provider]  clopidogrel (PLAVIX) 75 MG tablet Take 1 tablet (75 mg total) by mouth daily with breakfast. 05/03/15   Hilty, Nadean Corwin, MD  cyclobenzaprine (FLEXERIL) 10 MG tablet Take 1 tablet (10 mg total) by mouth 3 (three) times daily as needed for muscle spasms. 10/31/17   Newman Pies, MD  docusate sodium (COLACE) 100 MG capsule Take 1 capsule (100 mg total) by mouth 2 (two) times daily. Patient taking differently: Take 100 mg by mouth daily as needed for mild constipation or moderate constipation. 09/10/18   Orson Eva, MD  empagliflozin (JARDIANCE) 10 MG TABS tablet Take 10 mg by mouth daily. Patient taking differently: Take 25 mg by mouth daily. 10/06/18   Hilty, Nadean Corwin, MD  gabapentin (NEURONTIN) 300 MG capsule Take 300 mg by mouth at bedtime. 08/19/18   [provider]  HUMALOG MIX 75/25 KWIKPEN (75-25) 100 UNIT/ML Kwikpen Inject 30-60 Units into the skin 2 (two) times daily. Patient takes 30 units in the morning and 60 units at night 09/07/18   [provider]  icosapent Ethyl (VASCEPA) 1 g capsule TAKE (2) CAPSULES BY MOUTH TWICE DAILY. 03/24/21   Hilty, Nadean Corwin, MD  isosorbide mononitrate (IMDUR) 30 MG 24 hr tablet Take 1 tablet (30 mg total) by mouth daily. 09/11/18   Orson Eva, MD  Lancets [facility] DELICA PLUS [id number]) MISC Apply topically daily. 02/07/21   [provider]  Lancets (ONETOUCH DELICA PLUS [id number]) MISC Apply topically 2 (two) times daily. 11/21/20   [provider]  LANTUS SOLOSTAR 100 UNIT/ML Solostar Pen Inject 30 Units into the skin 2 (two) times daily. 09/08/19   [provider]  losartan (COZAAR) 25 MG tablet Take 1 tablet (25 mg total) by mouth daily. 01/22/22   Hilty, Nadean Corwin, MD  metoprolol succinate (TOPROL-XL) 25 MG 24 hr tablet TAKE 1 TABLET BY MOUTH ONCE A DAY. 05/02/21   Hilty, Nadean Corwin, MD  MYRBETRIQ 25 MG TB24 tablet  Take 1 tablet (25 mg total) by mouth daily. 06/22/21   Irine Seal, MD  [facility] VERIO test strip 2 (two) times daily. 11/21/20   [provider]  oxyCODONE-acetaminophen (PERCOCET) 10-325 MG tablet Take 1 tablet by mouth 4 (four) times daily as needed for pain. 02/20/21   [provider]  PROAIR HFA 108 (90 BASE) MCG/ACT inhaler Inhale 2 puffs into the lungs every 6 (six) hours as needed for wheezing or shortness of breath.  08/14/13   [provider]  rosuvastatin (CRESTOR) 40 MG tablet Take 1 tablet (40 mg total) by mouth daily. 09/10/18   Orson Eva, MD  zolpidem (AMBIEN CR) 6.25 MG CR tablet Take 6.25 mg by mouth at bedtime as needed for sleep. 06/15/20   [provider]    Family History    Family History  Problem Relation Age of Onset   Anesthesia problems Neg Hx    Colon cancer Neg Hx    Gastric cancer Neg Hx    Esophageal cancer Neg Hx    He indicated that the status of his neg hx is unknown.  Social History    Social History   Socioeconomic History   Marital status: Single    Spouse name: Not on file   Number  of children: 1   Years of education: Not on file   Highest education level: Not on file  Occupational History   Occupation: disabled    Employer: DISABLED  Tobacco Use   Smoking status: Every Day    Packs/day: 1.00    Years: 39.00    Total pack years: 39.00    Types: Cigarettes   Smokeless tobacco: Never  Vaping Use   Vaping Use: Never used  Substance and Sexual Activity   Alcohol use: Yes    Alcohol/week: 6.0 standard drinks of alcohol    Types: 6 Cans of beer per week   Drug use: No   Sexual activity: Not Currently  Other Topics Concern   Not on file  Social History Narrative   Lives w/ girlfriend   Social Determinants of Health   Financial Resource Strain: Not on file  Food Insecurity: Not on file  Transportation Needs: Not on file  Physical Activity: Not on file  Stress: Not on file  Social Connections: Not on file  Intimate Partner Violence: Not on file     Review of Systems    General:  No chills, fever, night sweats or weight changes.  Cardiovascular:  No chest pain, dyspnea on exertion, edema, orthopnea, palpitations, paroxysmal nocturnal dyspnea. Dermatological: No rash, lesions/masses Respiratory: No cough, dyspnea Urologic: No hematuria, dysuria Abdominal:   No nausea, vomiting, diarrhea, bright red blood per rectum, melena, or hematemesis Neurologic:  No visual changes, wkns, changes in mental status. All other systems reviewed and are otherwise negative except as noted above.  Physical Exam    VS:  Ht 5' 11" (1.803 m)   Wt 194 lb 12.8 oz (88.4 kg)   BMI 27.17 kg/m  , BMI Body mass index is 27.17 kg/m. GEN: Well nourished, well developed, in no acute distress. HEENT: normal. Neck: Supple, no JVD, carotid bruits, or masses. Cardiac: RRR, no murmurs, rubs, or gallops. No clubbing, cyanosis, edema.  Radials/DP/PT 2+ and equal bilaterally.  Respiratory:  Respirations regular and unlabored, clear to auscultation bilaterally. GI: Soft, nontender,  nondistended, BS + x 4. MS: no deformity or atrophy. Skin: warm and dry, no rash. Neuro:  Strength and sensation are intact. Psych: Normal  affect.  Accessory Clinical Findings    Recent Labs: 03/31/2021: BUN 12; Creatinine, Ser 0.97; Hemoglobin 14.1; Platelets 129; Potassium 3.8; Sodium 135   Recent Lipid Panel    Component Value Date/Time   CHOL 196 09/10/2018 1356   CHOL 160 12/07/2013 1436   TRIG 343 (H) 09/10/2018 1356   TRIG 182 (H) 12/07/2013 1436   HDL 40 (L) 09/10/2018 1356   HDL 40 12/07/2013 1436   CHOLHDL 4.9 09/10/2018 1356   VLDL 69 (H) 09/10/2018 1356   LDLCALC 87 09/10/2018 1356   LDLCALC 84 12/07/2013 1436    ECG personally reviewed by me today-normal sinus rhythm possible anterior infarct undetermined age 9 bpm- No acute changes  ABIs 02/20/2022 Right ABIs appear decreased by 0.12 compared to prior study on 09/27/21.  Left ABIs appear essentially unchanged compared to prior study on 09/27/21.     Summary:  Right: Resting right ankle-brachial index indicates moderate right lower  extremity arterial disease. The right toe-brachial index is abnormal.   Left: Resting left ankle-brachial index indicates moderate left lower  extremity arterial disease. The left toe-brachial index is abnormal.    *See table(s) above for measurements and observations.  See arterial duplex report.     Suggest follow up study in 12 months.  Lower extremity arterial's 02/21/2019  Summary:  Right: >50% stenosis of the right external iliac artery. 50-74% stenosis  of the above-knee popliteal artery. Patent stent in the mid-distal SFA.   Left: 30-49% stenosis noted in the common femoral artery. 75-99% stenosis  noted in the  proximal superficial femoral artery. Patent stent in the mid-distal SFA.  No significant change from previous exam.      See table(s) above for measurements and observations.   Suggest follow up study in 12 months, and/or vascular consult if  clinically  indicated.    Electronically signed by Carlyle Dolly MD on 02/21/2022 at 3:59:06 PM.   Assessment & Plan   1.  Peripheral vascular disease-reports bilateral lower extremity pain daily with walking short distances.  Underwent ABIs and lower extremity arterial's.  He was noted to have 75-99% stenosis of his proximal superficial femoral artery on the left and his ABIs showed a 0.12 decrease on his right compared to previous study. Continue aspirin, clopidogrel, rosuvastatin Increase Imdur to 60 mg daily Follow-up with Dr. [name]  Tobacco abuse-continues to smoke around 1.5 pack/day. Smoking cessation strongly recommended Smoking cessation resources given  Hyperlipidemia-LDL 87 on 09/10/2018 Continue rosuvastatin, aspirin, clopidogrel, Vascepa Heart healthy low-sodium high-fiber diet Increase physical activity as tolerated  Disposition: Follow-up with Dr. [name] on the 14th.   Jossie Ng. Cleaver NP-C     02/27/2022, 4:04 PM [facility] [place] [address] Office [phone] Fax [phone]  Notice: This dictation was prepared with Dragon dictation along with smaller phrase technology. Any transcriptional errors that result from this process are unintentional and may not be corrected upon review.  I spent 14 minutes examining this patient, reviewing medications, and using patient centered shared decision making involving her cardiac care.  Prior to her visit I spent greater than 20 minutes reviewing her past medical history,  medications, and prior cardiac tests.

## 2022-02-27 NOTE — Telephone Encounter (Signed)
Pt was here for appt today

## 2022-03-19 ENCOUNTER — Ambulatory Visit (INDEPENDENT_AMBULATORY_CARE_PROVIDER_SITE_OTHER): Payer: Medicare Other | Admitting: Cardiovascular Disease

## 2022-03-19 ENCOUNTER — Encounter: Payer: Self-pay | Admitting: Cardiovascular Disease

## 2022-03-19 DIAGNOSIS — I739 Peripheral vascular disease, unspecified: Secondary | ICD-10-CM

## 2022-03-19 DIAGNOSIS — Z01818 Encounter for other preprocedural examination: Secondary | ICD-10-CM | POA: Diagnosis not present

## 2022-03-19 NOTE — Patient Instructions (Signed)
Medication Instructions:  No changes *If you need a refill on your cardiac medications before your next appointment, please call your pharmacy*   Lab Work: Your provider would like for you to have the following labs today: BMET and CBC  If you have labs (blood work) drawn today and your tests are completely normal, you will receive your results only by: [place] (if you have MyChart) OR A paper copy in the mail If you have any lab test that is abnormal or we need to change your treatment, we will call you to review the results.   Testing/Procedures: Your physician has requested that you have a lower extremity arterial duplex in one month . During this test, ultrasound is used to evaluate arterial blood flow in the legs. Allow one hour for this exam. There are no restrictions or special instructions. This will take place at [place], [address].  Your physician has requested that you have an ankle brachial index (ABI) in one month. During this test an ultrasound and blood pressure cuff are used to evaluate the arteries that supply the arms and legs with blood. Allow thirty minutes for this exam. There are no restrictions or special instructions. This will take place at [place], [address].    Follow-Up: At [facility], you and your health needs are our priority.  As part of our continuing mission to provide you with exceptional heart care, we have created designated Provider Care Teams.  These Care Teams include your primary Cardiologist (physician) and Advanced Practice Providers (APPs -  Physician Assistants and Nurse Practitioners) who all work together to provide you with the care you need, when you need it.  We recommend signing up for the patient portal called "MyChart".  Sign up information is provided on this After Visit Summary.  MyChart is used to connect with patients for Virtual Visits (Telemedicine).  Patients are able to view lab/test results,  encounter notes, upcoming appointments, etc.  Non-urgent messages can be sent to your provider as well.   To learn more about what you can do with MyChart, go to [email or web address].    Your next appointment:   3 months  The format for your next appointment:   In Person  Provider:   Dr. [name]  Other Instructions  [facility] CARDIOVASCULAR DIVISION [facility] [address] [place] [address] Dept: [phone] Loc: [phone]  [name]  03/19/2022  You are scheduled for a Peripheral Angiogram on September 7th with Dr. [name].  1. Please arrive at the Main Entrance A at [facility]: [place], [place] [address] at 5:30 AM (This time is two hours before your procedure to ensure your preparation). Free valet parking service is available.   Special note: Every effort is made to have your procedure done on time. Please understand that emergencies sometimes delay scheduled procedures.  2. Diet: Do not eat solid foods after midnight.  You may have clear liquids until 5 AM upon the day of the procedure.  3. Labs: You will need to have blood drawn on 8/14. You do not need to be fasting.  4. Medication instructions in preparation for your procedure: Hold all diabetic medication the morning of the procedure Take half the dose of the Lantus the night before the procedure   On the morning of your procedure, take Aspirin and Plavix/Clopidogrel and any morning medicines NOT listed above.  You may use  sips of water.  5. Plan to go home the same day, you will only stay overnight if medically necessary. 6. You MUST have a responsible adult to drive you home. 7. An adult MUST be with you the first 24 hours after you arrive home. 8. Bring a current list of your medications, and the last time and date medication taken. 9. Bring ID and current insurance cards. 10.Please wear clothes that are easy to  get on and off and wear slip-on shoes.  Thank you for allowing [place] to care for you!   -- [facility]

## 2022-03-19 NOTE — Progress Notes (Signed)
03/19/2022 [name]   11/04/1960  [id number]  Primary Physician Pablo Lawrence, NP Primary Cardiologist: Lorretta Harp MD Lupe Carney, [place]  HPI:  [name] is a 61 y.o.  mildly overweight single, though lives with his girlfriend Ivin Booty, [place] male father of one child seen for peripheral vascular disease because of claudication and diminished ABIs.  I last saw him in the office 04/12/2021 .  Risk factors include continued tobacco abuse of one pack -2 pks per day, diabetes and hyperlipidemia. He was catheterized by Dr. Nicanor Bake 08/19/2012 demonstrated three-vessel disease. He ultimately underwent coronary artery bypass grafting x4 January 17 for LIMA to his LAD, a vein to the diagonal branch, ramus intermedius branch and to the RCA. He tolerated the procedure well. His EF was 50-55% by 2-D echo. Dopplers revealed ABIs of 0.5 and 0.7. He complaind of left greater than right claudication at one block. He underwent percutaneous revascularization using the Viance CTO catheter, diamondback orbital rotational atherectomy, chocolate balloon and IDEV Stent successfully to the mid Rt SFA. He enjoyed an excellent angiographic and clinical result with improvement Dopplers. He underwent an attempt at percutaneous revascularization of his long segment calcified left SFA CTO for lifestyle limiting claudication.I was unable to cross the lesion with a Viance CTO catheter.the patient presents in followup. Continues to have left lower extremity claudication. Also continues to smoke. We discussed options including retrograde attempt at history catheterization by Dr. Brunetta Jeans in May versus femoropopliteal bypass grafting. Since she's had coronary bypass grafting, I suspect he does not have adequate venous conduits left to perform lower shimmy surgical room randomization and what was thought to require PTFE.  I did send him to [facility] where Dr. Brunetta Jeans been 6 hours revascularizing a left  SFA CTO successfully.  He is continue to smoke a pack a day.  Over the last several months he has noticed increasing left greater than right lower extremity lifestyle of any claudication with recent Doppler studies performed 03/16/2021 revealing a right ABI of 0.80 and a left of 0.74.  He does have a high-frequency signal in his left SFA.  He wishes to proceed with outpatient endovascular therapy.   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  He underwent orbital atherectomy followed by VBX covered stenting (8 mm x 29 mm).  He was discharged home the following day.  His Dopplers performed 04/03/2021 revealed a widely patent stent.  His claudication resolved after that time.  He does unfortunately continue to smoke.  Since I saw him a year ago he has had recurrent claudication symptoms beginning of this year.  He had Dopplers performed 02/20/2022 revealing right ABI of 0.73 and a left ABI of 0.61.  He does have a high-frequency signal in his proximal left SFA with patent SFA stents bilaterally.  He wishes to proceed with outpatient peripheral angiography.     Current Meds  Medication Sig   aspirin EC 81 MG EC tablet Take 1 tablet (81 mg total) by mouth daily.   Blood Glucose Monitoring Suppl (ONETOUCH VERIO FLEX SYSTEM) w/Device KIT    clopidogrel (PLAVIX) 75 MG tablet Take 1 tablet (75 mg total) by mouth daily with breakfast.   cyclobenzaprine (FLEXERIL) 10 MG tablet Take 1 tablet (10 mg total) by mouth 3 (three) times daily as needed for muscle spasms.   empagliflozin (JARDIANCE) 10 MG TABS tablet Take 10 mg  by mouth daily. (Patient taking differently: Take 25 mg by mouth daily.)   gabapentin (NEURONTIN) 300 MG capsule Take 300 mg by mouth at bedtime.   HUMALOG MIX 75/25 KWIKPEN (75-25) 100 UNIT/ML Kwikpen Inject 30-60 Units into the skin 2 (two) times daily. Patient takes 30 units in the morning and 60  units at night   icosapent Ethyl (VASCEPA) 1 g capsule TAKE (2) CAPSULES BY MOUTH TWICE DAILY.   isosorbide mononitrate (IMDUR) 60 MG 24 hr tablet Take 1 tablet (60 mg total) by mouth daily.   Lancets (ONETOUCH DELICA PLUS [id number]) MISC Apply topically daily.   Lancets (ONETOUCH DELICA PLUS [id number]) MISC Apply topically 2 (two) times daily.   LANTUS SOLOSTAR 100 UNIT/ML Solostar Pen Inject 30 Units into the skin 2 (two) times daily.   losartan (COZAAR) 25 MG tablet Take 1 tablet (25 mg total) by mouth daily.   metoprolol succinate (TOPROL-XL) 25 MG 24 hr tablet TAKE 1 TABLET BY MOUTH ONCE A DAY.   MYRBETRIQ 25 MG TB24 tablet Take 1 tablet (25 mg total) by mouth daily.   ONETOUCH VERIO test strip 2 (two) times daily.   oxyCODONE-acetaminophen (PERCOCET) 10-325 MG tablet Take 1 tablet by mouth 4 (four) times daily as needed for pain.   PROAIR HFA 108 (90 BASE) MCG/ACT inhaler Inhale 2 puffs into the lungs every 6 (six) hours as needed for wheezing or shortness of breath.    rosuvastatin (CRESTOR) 40 MG tablet Take 1 tablet (40 mg total) by mouth daily.   zolpidem (AMBIEN CR) 6.25 MG CR tablet Take 6.25 mg by mouth at bedtime as needed for sleep.     No Known Allergies  Social History   Socioeconomic History   Marital status: Single    Spouse name: Not on file   Number of children: 1   Years of education: Not on file   Highest education level: Not on file  Occupational History   Occupation: disabled    Employer: DISABLED  Tobacco Use   Smoking status: Every Day    Packs/day: 1.00    Years: 39.00    Total pack years: 39.00    Types: Cigarettes   Smokeless tobacco: Never  Vaping Use   Vaping Use: Never used  Substance and Sexual Activity   Alcohol use: Yes    Alcohol/week: 6.0 standard drinks of alcohol    Types: 6 Cans of beer per week   Drug use: No   Sexual activity: Not Currently  Other Topics Concern   Not on file  Social History Narrative   Lives w/ girlfriend    Social Determinants of Health   Financial Resource Strain: Not on file  Food Insecurity: Not on file  Transportation Needs: Not on file  Physical Activity: Not on file  Stress: Not on file  Social Connections: Not on file  Intimate Partner Violence: Not on file     Review of Systems: General: negative for chills, fever, night sweats or weight changes.  Cardiovascular: negative for chest pain, dyspnea on exertion, edema, orthopnea, palpitations, paroxysmal nocturnal dyspnea or shortness of breath Dermatological: negative for rash Respiratory: negative for cough or wheezing Urologic: negative for hematuria Abdominal: negative for nausea, vomiting, diarrhea, bright red blood per rectum, melena, or hematemesis Neurologic: negative for visual changes, syncope, or dizziness All other systems reviewed and are otherwise negative except as noted above.    Blood pressure 108/60, pulse 76, height '5\' 11"'  (1.803 m), weight 193 lb 3.2 oz (87.6 kg), SpO2  94 %.  General appearance: alert and no distress Neck: no adenopathy, no carotid bruit, no JVD, supple, symmetrical, trachea midline, and thyroid not enlarged, symmetric, no tenderness/mass/nodules Lungs: clear to auscultation bilaterally Heart: regular rate and rhythm, S1, S2 normal, no murmur, click, rub or gallop Extremities: extremities normal, atraumatic, no cyanosis or edema Pulses: Diminished pedal pulses. Skin: Skin color, texture, turgor normal. No rashes or lesions Neurologic: Grossly normal  EKG not performed today  ASSESSMENT AND PLAN:   PVD (peripheral vascular disease) with claudication [facility]) Mr. Bunyard returns today for follow-up of his PAD.  I last saw him a year ago.  I performed intervention on his right SFA and Dr. Brunetta Jeans has intervene on his left SFA.  I performed orbital atherectomy followed by VBX covered stenting of his calcified proximal left common iliac artery stenosis 03/30/2021 with excellent result.  Over  the last 6 to 8 months has noticed bilateral calf claudication.  Doppler studies performed 02/20/2022 revealed a right ABI of 0.73 and a left of 0.61.  His iliac arteries appeared patent.  The stent in his right leg appears patent as well.  He does have a high-frequency signal in his proximal left SFA.  His left SFA stents are patent though he has monophasic waveforms and he has tibial vessel disease.  He wishes to proceed with outpatient peripheral angiography and potential endovascular therapy.     Lorretta Harp MD FACP,FACC,FAHA, [facility] 03/19/2022 11:03 AM

## 2022-03-19 NOTE — Assessment & Plan Note (Signed)
[name] returns today for follow-up of his PAD.  I last saw him a year ago.  I performed intervention on his right SFA and Dr. Brunetta Reed has intervene on his left SFA.  I performed orbital atherectomy followed by VBX covered stenting of his calcified proximal left common iliac artery stenosis 03/30/2021 with excellent result.  Over the last 6 to 8 months has noticed bilateral calf claudication.  Doppler studies performed 02/20/2022 revealed a right ABI of 0.73 and a left of 0.61.  His iliac arteries appeared patent.  The stent in his right leg appears patent as well.  He does have a high-frequency signal in his proximal left SFA.  His left SFA stents are patent though he has monophasic waveforms and he has tibial vessel disease.  He wishes to proceed with outpatient peripheral angiography and potential endovascular therapy.

## 2022-03-19 NOTE — H&P (View-Only) (Signed)
03/19/2022 [name]   [birth year]  [id number]  Primary Physician Pablo Lawrence, NP Primary Cardiologist: Lorretta Harp MD Lupe Carney, [place]  HPI:  [name] is a 61 y.o.  mildly overweight single, though lives with his girlfriend Ivin Booty, [place] male father of one child seen for peripheral vascular disease because of claudication and diminished ABIs.  I last saw him in the office 04/12/2021 .  Risk factors include continued tobacco abuse of one pack -2 pks per day, diabetes and hyperlipidemia. He was catheterized by Dr. Nicanor Bake 08/19/2012 demonstrated three-vessel disease. He ultimately underwent coronary artery bypass grafting x4 January 17 for LIMA to his LAD, a vein to the diagonal branch, ramus intermedius branch and to the RCA. He tolerated the procedure well. His EF was 50-55% by 2-D echo. Dopplers revealed ABIs of 0.5 and 0.7. He complaind of left greater than right claudication at one block. He underwent percutaneous revascularization using the Viance CTO catheter, diamondback orbital rotational atherectomy, chocolate balloon and IDEV Stent successfully to the mid Rt SFA. He enjoyed an excellent angiographic and clinical result with improvement Dopplers. He underwent an attempt at percutaneous revascularization of his long segment calcified left SFA CTO for lifestyle limiting claudication.I was unable to cross the lesion with a Viance CTO catheter.the patient presents in followup. Continues to have left lower extremity claudication. Also continues to smoke. We discussed options including retrograde attempt at history catheterization by Dr. Brunetta Jeans in [place] versus femoropopliteal bypass grafting. Since she's had coronary bypass grafting, I suspect he does not have adequate venous conduits left to perform lower shimmy surgical room randomization and what was thought to require PTFE.  I did send him to [facility] where Dr. Brunetta Jeans been 6 hours revascularizing a left  SFA CTO successfully.  He is continue to smoke a pack a day.  Over the last several months he has noticed increasing left greater than right lower extremity lifestyle of any claudication with recent Doppler studies performed 03/16/2021 revealing a right ABI of 0.80 and a left of 0.74.  He does have a high-frequency signal in his left SFA.  He wishes to proceed with outpatient endovascular therapy.   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  He underwent orbital atherectomy followed by VBX covered stenting (8 mm x 29 mm).  He was discharged home the following day.  His Dopplers performed 04/03/2021 revealed a widely patent stent.  His claudication resolved after that time.  He does unfortunately continue to smoke.  Since I saw him a year ago he has had recurrent claudication symptoms beginning of this year.  He had Dopplers performed 02/20/2022 revealing right ABI of 0.73 and a left ABI of 0.61.  He does have a high-frequency signal in his proximal left SFA with patent SFA stents bilaterally.  He wishes to proceed with outpatient peripheral angiography.     Current Meds  Medication Sig   aspirin EC 81 MG EC tablet Take 1 tablet (81 mg total) by mouth daily.   Blood Glucose Monitoring Suppl (ONETOUCH VERIO FLEX SYSTEM) w/Device KIT    clopidogrel (PLAVIX) 75 MG tablet Take 1 tablet (75 mg total) by mouth daily with breakfast.   cyclobenzaprine (FLEXERIL) 10 MG tablet Take 1 tablet (10 mg total) by mouth 3 (three) times daily as needed for muscle spasms.   empagliflozin (JARDIANCE) 10 MG TABS tablet Take 10 mg  by mouth daily. (Patient taking differently: Take 25 mg by mouth daily.)   gabapentin (NEURONTIN) 300 MG capsule Take 300 mg by mouth at bedtime.   HUMALOG MIX 75/25 KWIKPEN (75-25) 100 UNIT/ML Kwikpen Inject 30-60 Units into the skin 2 (two) times daily. Patient takes 30 units in the morning and 60  units at night   icosapent Ethyl (VASCEPA) 1 g capsule TAKE (2) CAPSULES BY MOUTH TWICE DAILY.   isosorbide mononitrate (IMDUR) 60 MG 24 hr tablet Take 1 tablet (60 mg total) by mouth daily.   Lancets (ONETOUCH DELICA PLUS [id number]) MISC Apply topically daily.   Lancets (ONETOUCH DELICA PLUS [id number]) MISC Apply topically 2 (two) times daily.   LANTUS SOLOSTAR 100 UNIT/ML Solostar Pen Inject 30 Units into the skin 2 (two) times daily.   losartan (COZAAR) 25 MG tablet Take 1 tablet (25 mg total) by mouth daily.   metoprolol succinate (TOPROL-XL) 25 MG 24 hr tablet TAKE 1 TABLET BY MOUTH ONCE A DAY.   MYRBETRIQ 25 MG TB24 tablet Take 1 tablet (25 mg total) by mouth daily.   ONETOUCH VERIO test strip 2 (two) times daily.   oxyCODONE-acetaminophen (PERCOCET) 10-325 MG tablet Take 1 tablet by mouth 4 (four) times daily as needed for pain.   PROAIR HFA 108 (90 BASE) MCG/ACT inhaler Inhale 2 puffs into the lungs every 6 (six) hours as needed for wheezing or shortness of breath.    rosuvastatin (CRESTOR) 40 MG tablet Take 1 tablet (40 mg total) by mouth daily.   zolpidem (AMBIEN CR) 6.25 MG CR tablet Take 6.25 mg by mouth at bedtime as needed for sleep.     No Known Allergies  Social History   Socioeconomic History   Marital status: Single    Spouse name: Not on file   Number of children: 1   Years of education: Not on file   Highest education level: Not on file  Occupational History   Occupation: disabled    Employer: DISABLED  Tobacco Use   Smoking status: Every Day    Packs/day: 1.00    Years: 39.00    Total pack years: 39.00    Types: Cigarettes   Smokeless tobacco: Never  Vaping Use   Vaping Use: Never used  Substance and Sexual Activity   Alcohol use: Yes    Alcohol/week: 6.0 standard drinks of alcohol    Types: 6 Cans of beer per week   Drug use: No   Sexual activity: Not Currently  Other Topics Concern   Not on file  Social History Narrative   Lives w/ girlfriend    Social Determinants of Health   Financial Resource Strain: Not on file  Food Insecurity: Not on file  Transportation Needs: Not on file  Physical Activity: Not on file  Stress: Not on file  Social Connections: Not on file  Intimate Partner Violence: Not on file     Review of Systems: General: negative for chills, fever, night sweats or weight changes.  Cardiovascular: negative for chest pain, dyspnea on exertion, edema, orthopnea, palpitations, paroxysmal nocturnal dyspnea or shortness of breath Dermatological: negative for rash Respiratory: negative for cough or wheezing Urologic: negative for hematuria Abdominal: negative for nausea, vomiting, diarrhea, bright red blood per rectum, melena, or hematemesis Neurologic: negative for visual changes, syncope, or dizziness All other systems reviewed and are otherwise negative except as noted above.    Blood pressure 108/60, pulse 76, height '5\' 11"'  (1.803 m), weight 193 lb 3.2 oz (87.6 kg), SpO2  94 %.  General appearance: alert and no distress Neck: no adenopathy, no carotid bruit, no JVD, supple, symmetrical, trachea midline, and thyroid not enlarged, symmetric, no tenderness/mass/nodules Lungs: clear to auscultation bilaterally Heart: regular rate and rhythm, S1, S2 normal, no murmur, click, rub or gallop Extremities: extremities normal, atraumatic, no cyanosis or edema Pulses: Diminished pedal pulses. Skin: Skin color, texture, turgor normal. No rashes or lesions Neurologic: Grossly normal  EKG not performed today  ASSESSMENT AND PLAN:   PVD (peripheral vascular disease) with claudication [facility]) Mr. Rout returns today for follow-up of his PAD.  I last saw him a year ago.  I performed intervention on his right SFA and Dr. Brunetta Jeans has intervene on his left SFA.  I performed orbital atherectomy followed by VBX covered stenting of his calcified proximal left common iliac artery stenosis 03/30/2021 with excellent result.  Over  the last 6 to 8 months has noticed bilateral calf claudication.  Doppler studies performed 02/20/2022 revealed a right ABI of 0.73 and a left of 0.61.  His iliac arteries appeared patent.  The stent in his right leg appears patent as well.  He does have a high-frequency signal in his proximal left SFA.  His left SFA stents are patent though he has monophasic waveforms and he has tibial vessel disease.  He wishes to proceed with outpatient peripheral angiography and potential endovascular therapy.     Lorretta Harp MD FACP,FACC,FAHA, [facility] 03/19/2022 11:03 AM

## 2022-03-20 LAB — BASIC METABOLIC PANEL
BUN/Creatinine Ratio: 10 (ref 10–24)
BUN: 8 mg/dL (ref 8–27)
CO2: 24 mmol/L (ref 20–29)
Calcium: 9.6 mg/dL (ref 8.6–10.2)
Chloride: 96 mmol/L (ref 96–106)
Creatinine, Ser: 0.78 mg/dL (ref 0.76–1.27)
Glucose: 149 mg/dL — ABNORMAL HIGH (ref 70–99)
Potassium: 4.9 mmol/L (ref 3.5–5.2)
Sodium: 136 mmol/L (ref 134–144)
eGFR: 102 mL/min/{1.73_m2} (ref >59)

## 2022-03-20 LAB — CBC
Hematocrit: 48.4 % (ref 37.5–51.0)
Hemoglobin: 16.8 g/dL (ref 13.0–17.7)
MCH: 31.2 pg (ref 26.6–33.0)
MCHC: 34.7 g/dL (ref 31.5–35.7)
MCV: 90 fL (ref 79–97)
Platelets: 182 10*3/uL (ref 150–450)
RBC: 5.39 x10E6/uL (ref 4.14–5.80)
RDW: 12.4 % (ref 11.6–15.4)
WBC: 6.6 10*3/uL (ref 3.4–10.8)

## 2022-04-10 ENCOUNTER — Telehealth: Payer: Self-pay | Admitting: *Deleted

## 2022-04-10 NOTE — Telephone Encounter (Addendum)
Abdominal aortogram scheduled at [facility] for: Thursday April 12, 2022 7:30 AM Arrival time and place: [place] Entrance A at: 5:30 AM  Nothing to eat after midnight prior to procedure, clear liquids until 5 AM day of procedure.  Medication instructions: -Hold:  Jardiance-AM of procedure  Insulin-AM of procedure/1/2 HS prior to procedure -Except hold medications usual morning medications can be taken with sips of water including aspirin 81 mg and Plavix 75 mg  Confirmed patient has responsible adult to drive home post procedure and be with patient first 24 hours after arriving home.  Patient reports no new symptoms concerning for COVID-19 in the past 10 days.  Reviewed procedure instructions with patient.

## 2022-04-12 ENCOUNTER — Ambulatory Visit (HOSPITAL_COMMUNITY)
Admission: RE | Disposition: A | Discharge status: Home / Self Care | Payer: Self-pay | Source: Home / Self Care | Attending: Cardiovascular Disease

## 2022-04-12 ENCOUNTER — Ambulatory Visit (HOSPITAL_COMMUNITY)
Admission: RE | Admit: 2022-04-12 | Discharge: 2022-04-13 | Disposition: A | Discharge status: Home / Self Care | Payer: Medicare Other | Attending: Cardiovascular Disease | Admitting: Cardiovascular Disease

## 2022-04-12 ENCOUNTER — Encounter (HOSPITAL_COMMUNITY): Payer: Self-pay | Admitting: Cardiovascular Disease

## 2022-04-12 ENCOUNTER — Other Ambulatory Visit: Payer: Self-pay

## 2022-04-12 DIAGNOSIS — I70212 Atherosclerosis of native arteries of extremities with intermittent claudication, left leg: Secondary | ICD-10-CM | POA: Diagnosis not present

## 2022-04-12 DIAGNOSIS — E1151 Type 2 diabetes mellitus with diabetic peripheral angiopathy without gangrene: Secondary | ICD-10-CM | POA: Insufficient documentation

## 2022-04-12 DIAGNOSIS — Z794 Long term (current) use of insulin: Secondary | ICD-10-CM | POA: Diagnosis not present

## 2022-04-12 DIAGNOSIS — E785 Hyperlipidemia, unspecified: Secondary | ICD-10-CM | POA: Insufficient documentation

## 2022-04-12 DIAGNOSIS — F17210 Nicotine dependence, cigarettes, uncomplicated: Secondary | ICD-10-CM | POA: Insufficient documentation

## 2022-04-12 DIAGNOSIS — I739 Peripheral vascular disease, unspecified: Secondary | ICD-10-CM | POA: Diagnosis present

## 2022-04-12 HISTORY — PX: PERIPHERAL VASCULAR BALLOON ANGIOPLASTY: CATH118281

## 2022-04-12 HISTORY — PX: ABDOMINAL AORTOGRAM W/LOWER EXTREMITY: CATH118223

## 2022-04-12 LAB — POCT ACTIVATED CLOTTING TIME
Activated Clotting Time: 293 seconds
Activated Clotting Time: 317 seconds
Activated Clotting Time: 149 seconds

## 2022-04-12 LAB — GLUCOSE, CAPILLARY: Glucose-Capillary: 175 mg/dL — ABNORMAL HIGH (ref 70–99)

## 2022-04-12 SURGERY — ABDOMINAL AORTOGRAM W/LOWER EXTREMITY
Anesthesia: LOCAL

## 2022-04-12 MED ORDER — ROSUVASTATIN CALCIUM 20 MG PO TABS
40.0000 mg | ORAL_TABLET | Freq: Every day | ORAL | Status: DC
  Administered 2022-04-12 – 2022-04-13 (×2): 40 mg via ORAL
  Filled 2022-04-12 (×2): qty 2

## 2022-04-12 MED ORDER — OXYCODONE-ACETAMINOPHEN 5-325 MG PO TABS: 1.0000 | ORAL_TABLET | ORAL | Status: DC | PRN

## 2022-04-12 MED ORDER — SODIUM CHLORIDE 0.9 % IV SOLN: INTRAVENOUS | Status: AC

## 2022-04-12 MED ORDER — SODIUM CHLORIDE 0.9 % WEIGHT BASED INFUSION: 1.0000 mL/kg/h | INTRAVENOUS | Status: DC

## 2022-04-12 MED ORDER — ALBUTEROL SULFATE (2.5 MG/3ML) 0.083% IN NEBU: 3.0000 mL | INHALATION_SOLUTION | Freq: Four times a day (QID) | RESPIRATORY_TRACT | Status: DC | PRN

## 2022-04-12 MED ORDER — METOPROLOL SUCCINATE ER 25 MG PO TB24
25.0000 mg | ORAL_TABLET | Freq: Every day | ORAL | Status: DC
  Administered 2022-04-12 – 2022-04-13 (×2): 25 mg via ORAL
  Filled 2022-04-12 (×2): qty 1

## 2022-04-12 MED ORDER — SODIUM CHLORIDE 0.9 % IV SOLN
250.0000 mL | INTRAVENOUS | Status: DC | PRN
Start: 2022-04-12 — End: 2022-04-13

## 2022-04-12 MED ORDER — LABETALOL HCL 5 MG/ML IV SOLN
INTRAVENOUS | Status: AC
  Filled 2022-04-12: qty 4

## 2022-04-12 MED ORDER — LIDOCAINE HCL (PF) 1 % IJ SOLN
INTRAMUSCULAR | Status: DC | PRN
  Administered 2022-04-12: 15 mL via INTRADERMAL

## 2022-04-12 MED ORDER — SODIUM CHLORIDE 0.9% FLUSH
3.0000 mL | INTRAVENOUS | Status: DC | PRN
Start: 2022-04-12 — End: 2022-04-13

## 2022-04-12 MED ORDER — FENTANYL CITRATE (PF) 100 MCG/2ML IJ SOLN
INTRAMUSCULAR | Status: DC | PRN
  Administered 2022-04-12 (×3): 25 ug via INTRAVENOUS

## 2022-04-12 MED ORDER — SODIUM CHLORIDE 0.9 % IV SOLN: 250.0000 mL | INTRAVENOUS | Status: DC | PRN

## 2022-04-12 MED ORDER — ISOSORBIDE MONONITRATE ER 60 MG PO TB24
60.0000 mg | ORAL_TABLET | Freq: Every day | ORAL | Status: DC
  Administered 2022-04-12 – 2022-04-13 (×2): 60 mg via ORAL
  Filled 2022-04-12 (×2): qty 1

## 2022-04-12 MED ORDER — MIDAZOLAM HCL 2 MG/2ML IJ SOLN
INTRAMUSCULAR | Status: AC
  Filled 2022-04-12: qty 2

## 2022-04-12 MED ORDER — OXYCODONE-ACETAMINOPHEN 10-325 MG PO TABS: 1.0000 | ORAL_TABLET | Freq: Four times a day (QID) | ORAL | Status: DC | PRN

## 2022-04-12 MED ORDER — ACETAMINOPHEN 325 MG PO TABS
650.0000 mg | ORAL_TABLET | ORAL | Status: DC | PRN
  Administered 2022-04-12: 650 mg via ORAL
  Filled 2022-04-12: qty 2

## 2022-04-12 MED ORDER — OXYCODONE HCL 5 MG PO TABS: 5.0000 mg | ORAL_TABLET | ORAL | Status: DC | PRN

## 2022-04-12 MED ORDER — HYDRALAZINE HCL 20 MG/ML IJ SOLN
INTRAMUSCULAR | Status: AC
  Filled 2022-04-12: qty 1

## 2022-04-12 MED ORDER — FENTANYL CITRATE (PF) 100 MCG/2ML IJ SOLN
INTRAMUSCULAR | Status: AC
  Filled 2022-04-12: qty 2

## 2022-04-12 MED ORDER — HYDRALAZINE HCL 20 MG/ML IJ SOLN
INTRAMUSCULAR | Status: DC | PRN
  Administered 2022-04-12 (×2): 10 mg via INTRAVENOUS

## 2022-04-12 MED ORDER — ASPIRIN 81 MG PO TBEC
81.0000 mg | DELAYED_RELEASE_TABLET | Freq: Every day | ORAL | Status: DC
  Administered 2022-04-13: 81 mg via ORAL
  Filled 2022-04-12: qty 1

## 2022-04-12 MED ORDER — HEPARIN SODIUM (PORCINE) 1000 UNIT/ML IJ SOLN
INTRAMUSCULAR | Status: DC | PRN
  Administered 2022-04-12: 9000 [IU] via INTRAVENOUS

## 2022-04-12 MED ORDER — SODIUM CHLORIDE 0.9 % WEIGHT BASED INFUSION
3.0000 mL/kg/h | INTRAVENOUS | Status: DC
  Administered 2022-04-12: 3 mL/kg/h via INTRAVENOUS

## 2022-04-12 MED ORDER — SODIUM CHLORIDE 0.9% FLUSH
3.0000 mL | Freq: Two times a day (BID) | INTRAVENOUS | Status: DC
  Administered 2022-04-13: 3 mL via INTRAVENOUS

## 2022-04-12 MED ORDER — SODIUM CHLORIDE 0.9% FLUSH: 3.0000 mL | INTRAVENOUS | Status: DC | PRN

## 2022-04-12 MED ORDER — LIDOCAINE HCL (PF) 1 % IJ SOLN
INTRAMUSCULAR | Status: AC
Start: 2022-04-12 — End: ?
  Filled 2022-04-12: qty 30

## 2022-04-12 MED ORDER — HYDRALAZINE HCL 20 MG/ML IJ SOLN: 5.0000 mg | INTRAMUSCULAR | Status: DC | PRN

## 2022-04-12 MED ORDER — CLOPIDOGREL BISULFATE 75 MG PO TABS
75.0000 mg | ORAL_TABLET | Freq: Every day | ORAL | Status: DC
  Administered 2022-04-13: 75 mg via ORAL
  Filled 2022-04-12: qty 1

## 2022-04-12 MED ORDER — ZOLPIDEM TARTRATE 5 MG PO TABS: 5.0000 mg | ORAL_TABLET | Freq: Every evening | ORAL | Status: DC | PRN

## 2022-04-12 MED ORDER — IODIXANOL 320 MG/ML IV SOLN
INTRAVENOUS | Status: DC | PRN
  Administered 2022-04-12: 150 mL via INTRA_ARTERIAL

## 2022-04-12 MED ORDER — HEPARIN (PORCINE) IN NACL 1000-0.9 UT/500ML-% IV SOLN
INTRAVENOUS | Status: AC
  Filled 2022-04-12: qty 1000

## 2022-04-12 MED ORDER — EMPAGLIFLOZIN 25 MG PO TABS
25.0000 mg | ORAL_TABLET | Freq: Every day | ORAL | Status: DC
  Administered 2022-04-13: 25 mg via ORAL
  Filled 2022-04-12: qty 1

## 2022-04-12 MED ORDER — ONDANSETRON HCL 4 MG/2ML IJ SOLN: 4.0000 mg | Freq: Four times a day (QID) | INTRAMUSCULAR | Status: DC | PRN

## 2022-04-12 MED ORDER — ASPIRIN 81 MG PO TBEC: 81.0000 mg | DELAYED_RELEASE_TABLET | Freq: Every day | ORAL | Status: DC

## 2022-04-12 MED ORDER — CLOPIDOGREL BISULFATE 75 MG PO TABS: 75.0000 mg | ORAL_TABLET | Freq: Once | ORAL | Status: DC

## 2022-04-12 MED ORDER — MORPHINE SULFATE (PF) 2 MG/ML IV SOLN
2.0000 mg | INTRAVENOUS | Status: DC | PRN
  Administered 2022-04-12: 2 mg via INTRAVENOUS
  Filled 2022-04-12: qty 1

## 2022-04-12 MED ORDER — LABETALOL HCL 5 MG/ML IV SOLN
10.0000 mg | INTRAVENOUS | Status: DC | PRN
  Administered 2022-04-12: 10 mg via INTRAVENOUS

## 2022-04-12 MED ORDER — MIDAZOLAM HCL 2 MG/2ML IJ SOLN
INTRAMUSCULAR | Status: DC | PRN
  Administered 2022-04-12: 1 mg via INTRAVENOUS

## 2022-04-12 MED ORDER — HEPARIN SODIUM (PORCINE) 1000 UNIT/ML IJ SOLN
INTRAMUSCULAR | Status: AC
Start: 2022-04-12 — End: ?
  Filled 2022-04-12: qty 10

## 2022-04-12 MED ORDER — SODIUM CHLORIDE 0.9% FLUSH: 3.0000 mL | Freq: Two times a day (BID) | INTRAVENOUS | Status: DC

## 2022-04-12 MED ORDER — LOSARTAN POTASSIUM 25 MG PO TABS
25.0000 mg | ORAL_TABLET | Freq: Every day | ORAL | Status: DC
  Administered 2022-04-12 – 2022-04-13 (×2): 25 mg via ORAL
  Filled 2022-04-12 (×2): qty 1

## 2022-04-12 MED ORDER — ASPIRIN 81 MG PO CHEW: 81.0000 mg | CHEWABLE_TABLET | ORAL | Status: DC

## 2022-04-12 MED ORDER — CLOPIDOGREL BISULFATE 75 MG PO TABS
75.0000 mg | ORAL_TABLET | Freq: Every day | ORAL | Status: DC
Start: 2022-04-13 — End: 2022-04-12

## 2022-04-12 SURGICAL SUPPLY — 24 items
BALLN CHOCOLATE 5.0X40X120 (BALLOONS) ×2
BALLN COYOTE ES OTW 2.5X20X143 (BALLOONS) ×2
BALLN IN.PACT DCB 5X60 (BALLOONS) ×2
BALLOON CHOCOLATE 5.0X40X120 (BALLOONS) IMPLANT
BALLOON CYTE ES OTW 2.5X20X143 (BALLOONS) IMPLANT
CATH ANGIO 5F PIGTAIL 65CM (CATHETERS) IMPLANT
CATH CROSS OVER TEMPO 5F (CATHETERS) IMPLANT
DCB IN.PACT 5X60 (BALLOONS) IMPLANT
DEVICE SPIDERFX EMB PROT 6MM (WIRE) IMPLANT
KIT ENCORE 26 ADVANTAGE (KITS) IMPLANT
KIT PV (KITS) ×2 IMPLANT
SHEATH FLEX ANSEL ANG 5F 45CM (SHEATH) IMPLANT
SHEATH FLEX ANSEL ANG 6F 45CM (SHEATH) IMPLANT
SHEATH PINNACLE 5F 10CM (SHEATH) IMPLANT
SHEATH PINNACLE 6F 10CM (SHEATH) IMPLANT
SHEATH PINNACLE 7F 10CM (SHEATH) IMPLANT
STOPCOCK MORSE 400PSI 3WAY (MISCELLANEOUS) IMPLANT
SYR MEDRAD MARK 7 150ML (SYRINGE) ×2 IMPLANT
TAPE SHOOT N SEE (TAPE) IMPLANT
TRANSDUCER W/STOPCOCK (MISCELLANEOUS) ×2 IMPLANT
TRAY PV CATH (CUSTOM PROCEDURE TRAY) ×2 IMPLANT
TUBING CIL FLEX 10 FLL-RA (TUBING) IMPLANT
WIRE HITORQ VERSACORE ST 145CM (WIRE) IMPLANT
WIRE SHEPHERD 6G .014 (WIRE) IMPLANT

## 2022-04-12 NOTE — Progress Notes (Signed)
Site: Right femoral Sheath Size: 30f Condition prior to removal:  Level 0 Type of pressure held: manual Time pressure held: 35 minutes Status of patient during pull: stable Condition of site post pull:  Level 0 Type of dressing applied: gauze w/ transparent Pulses verified: Dopplered bilat. PT and DP Patient's condition post pull: stable Bedrest begins at: 1215 Post instructions given to patient: yes

## 2022-04-12 NOTE — Interval H&P Note (Signed)
History and Physical Interval Note:  04/12/2022 7:43 AM  [name]  has presented today for surgery, with the diagnosis of pad.  The various methods of treatment have been discussed with the patient and family. After consideration of risks, benefits and other options for treatment, the patient has consented to  Procedure(s): ABDOMINAL AORTOGRAM W/LOWER EXTREMITY (N/A) as a surgical intervention.  The patient's history has been reviewed, patient examined, no change in status, stable for surgery.  I have reviewed the patient's chart and labs.  Questions were answered to the patient's satisfaction.     [name]

## 2022-04-13 DIAGNOSIS — E785 Hyperlipidemia, unspecified: Secondary | ICD-10-CM | POA: Diagnosis not present

## 2022-04-13 DIAGNOSIS — I739 Peripheral vascular disease, unspecified: Secondary | ICD-10-CM | POA: Diagnosis not present

## 2022-04-13 DIAGNOSIS — Z794 Long term (current) use of insulin: Secondary | ICD-10-CM | POA: Diagnosis not present

## 2022-04-13 DIAGNOSIS — F17210 Nicotine dependence, cigarettes, uncomplicated: Secondary | ICD-10-CM | POA: Diagnosis not present

## 2022-04-13 DIAGNOSIS — E1151 Type 2 diabetes mellitus with diabetic peripheral angiopathy without gangrene: Secondary | ICD-10-CM | POA: Diagnosis not present

## 2022-04-13 DIAGNOSIS — Z720 Tobacco use: Secondary | ICD-10-CM | POA: Diagnosis not present

## 2022-04-13 DIAGNOSIS — I25118 Atherosclerotic heart disease of native coronary artery with other forms of angina pectoris: Secondary | ICD-10-CM | POA: Diagnosis not present

## 2022-04-13 DIAGNOSIS — I70212 Atherosclerosis of native arteries of extremities with intermittent claudication, left leg: Secondary | ICD-10-CM | POA: Diagnosis not present

## 2022-04-13 LAB — LIPID PANEL
Cholesterol: 110 mg/dL (ref 0–200)
HDL: 29 mg/dL — ABNORMAL LOW (ref >40)
LDL Cholesterol: 46 mg/dL (ref 0–99)
Total CHOL/HDL Ratio: 3.8 RATIO
Triglycerides: 173 mg/dL — ABNORMAL HIGH (ref <150)
VLDL: 35 mg/dL (ref 0–40)

## 2022-04-13 LAB — BASIC METABOLIC PANEL
Anion gap: 14 (ref 5–15)
BUN: 11 mg/dL (ref 8–23)
CO2: 23 mmol/L (ref 22–32)
Calcium: 9.1 mg/dL (ref 8.9–10.3)
Chloride: 101 mmol/L (ref 98–111)
Creatinine, Ser: 0.87 mg/dL (ref 0.61–1.24)
GFR, Estimated: >60 mL/min (ref >60)
Glucose, Bld: 191 mg/dL — ABNORMAL HIGH (ref 70–99)
Potassium: 4.4 mmol/L (ref 3.5–5.1)
Sodium: 138 mmol/L (ref 135–145)

## 2022-04-13 LAB — CBC
HCT: 45.7 % (ref 39.0–52.0)
Hemoglobin: 15.1 g/dL (ref 13.0–17.0)
MCH: 31.2 pg (ref 26.0–34.0)
MCHC: 33 g/dL (ref 30.0–36.0)
MCV: 94.4 fL (ref 80.0–100.0)
Platelets: 129 10*3/uL — ABNORMAL LOW (ref 150–400)
RBC: 4.84 MIL/uL (ref 4.22–5.81)
RDW: 12.6 % (ref 11.5–15.5)
WBC: 7.8 10*3/uL (ref 4.0–10.5)
nRBC: 0 % (ref 0.0–0.2)

## 2022-04-13 MED FILL — Heparin Sod (Porcine)-NaCl IV Soln 1000 Unit/500ML-0.9%: INTRAVENOUS | Qty: 1000 | Status: AC

## 2022-04-13 NOTE — Progress Notes (Signed)
Mobility Specialist Progress Note:   04/13/22 0949  Mobility  Activity Ambulated with assistance in hallway  Level of Assistance Independent  Assistive Device None  Distance Ambulated (ft) 300 ft  Activity Response Tolerated well  $Mobility charge 1 Mobility   Pt received in bed willing to participate in mobility. No complaints of pain. Left in bed with call bell in reach and all needs met.   [place] [facility] Mobility Specialist  

## 2022-04-13 NOTE — Discharge Summary (Addendum)
Discharge Summary    Patient ID: [name] MRN: [id number]; DOB: [birth year]  Admit date: 04/12/2022 Discharge date: 04/13/2022  PCP:  Shaun Lawrence, NP   [facility] Cardiologist:  Shaun Casino, MD     Discharge Diagnoses    Principal Problem:   Claudication in peripheral vascular disease [facility])  Diagnostic Studies/Procedures    PV angiogram: 04/12/22  Final Impression: Successful proximal calcified subtotally occluded left SFA stenosis PTA/DCB using spider distal protection with acceptable angiographic result.  The sheath will be removed once ACT falls below 170 and pressure held.  He will be hydrated overnight, discharged home in the morning.  We will get lower extremity arterial Doppler studies in unrefined office next week and I will see him back in the office 1 to 2 weeks thereafter.  [name]. MD, [facility] 04/12/2022 9:22 AM _____________   History of Present Illness     [name] is a 61 y.o. male with past medical history of hypertension, hyperlipidemia, CAD status post CABG x4 (LIMA to LAD, SVG to diagonal, SVG to ramus, SVG to RCA), diabetes, tobacco use and peripheral vascular disease who has been followed by [name] as an outpatient.  Underwent percutaneous revascularization with diamondback orbital rotational atherectomy and successful stenting to his right SFA.  Did have improvement in his lower extremity Dopplers.  Underwent attempted revascularization of long segmental calcified left SFA with CTO but lesion was unable to be crossed.  At follow-up appointment he continued to have lower extremity claudication.  He was referred to [name] at [facility] and underwent 6-hour revascularization of his left SFA for CTO which was successful.  Underwent PV angiogram with [name] on 03/2021 which showed patent CFA stents with higher grade calcified left common iliac artery stenosis with successful orbital atherectomy and stenting.  Repeat  ABIs 02/2022 showed right ABI of 0.73 and left ABI of 0.61.  High-frequency signal in the proximal left SFA with patent SFA stents bilaterally.  Complaint of recurrent claudication symptoms.  He was seen by [name] in the office on 03/19/2022 with recommendations to proceed with outpatient peripheral angiogram.   Hospital Course     Underwent successful proximal calcified occluded left SFA stenosis PTA with drug-coated balloon using spider distal protection.  Plan for DAPT with aspirin/Plavix.  Admitted overnight for hydration. No complications noted over night. No significant changes to home medications.   General: Well developed, well nourished, male appearing in no acute distress. Head: Normocephalic, atraumatic.  Neck: Supple without bruits, JVD. Lungs:  Resp regular and unlabored, CTA. Heart: RRR, S1, S2, no S3, S4, or murmur; no rub. Abdomen: Soft, non-tender, non-distended with normoactive bowel sounds. No hepatomegaly. No rebound/guarding. No obvious abdominal masses. Extremities: No clubbing, cyanosis, edema. Distal pedal pulses are 2+ bilaterally. Right femoral cath site stable without bruising or hematoma Neuro: Alert and oriented X 3. Moves all extremities spontaneously. Psych: Normal affect.  Did the patient have an acute coronary syndrome (MI, NSTEMI, STEMI, etc) this admission?:  No                               Did the patient have a percutaneous coronary intervention (stent / angioplasty)?:  No.    The patient will be scheduled for a TOC follow up appointment in 10-14 days.  A message has been sent to the [facility] and Scheduling Pool at the office where the  patient should be seen for follow up.  _____________  Discharge Vitals Blood pressure (!) 152/73, pulse 79, temperature 97.8 F (36.6 C), temperature source Oral, resp. rate 18, height _0  (1.676 m), weight 90.5 kg, SpO2 90 %.  Filed Weights   04/12/22 0600 04/12/22 0631 04/12/22 1249  Weight: 87.6 kg 80.7 kg 90.5 kg     Labs & Radiologic Studies    CBC Recent Labs    04/13/22 0645  WBC 7.8  HGB 15.1  HCT 45.7  MCV 94.4  PLT 272*   Basic Metabolic Panel Recent Labs    04/13/22 0645  NA 138  K 4.4  CL 101  CO2 23  GLUCOSE 191*  BUN 11  CREATININE 0.87  CALCIUM 9.1   Liver Function Tests No results for input(s): "AST", "ALT", "ALKPHOS", "BILITOT", "PROT", "ALBUMIN" in the last 72 hours. No results for input(s): "LIPASE", "AMYLASE" in the last 72 hours. High Sensitivity Troponin:   No results for input(s): "TROPONINIHS" in the last 720 hours.  BNP Invalid input(s): "POCBNP" D-Dimer No results for input(s): "DDIMER" in the last 72 hours. Hemoglobin A1C No results for input(s): "HGBA1C" in the last 72 hours. Fasting Lipid Panel Recent Labs    04/13/22 0645  CHOL 110  HDL 29*  LDLCALC 46  TRIG 173*  CHOLHDL 3.8   Thyroid Function Tests No results for input(s): "TSH", "T4TOTAL", "T3FREE", "THYROIDAB" in the last 72 hours.  Invalid input(s): "FREET3" _____________  PERIPHERAL VASCULAR CATHETERIZATION  Result Date: 04/12/2022 Images from the original result were not included.  [id number] LOCATION:  FACILITY: [place] PHYSICIAN: [name], M.D. 07/19/1961 DATE OF PROCEDURE:  04/12/2022 DATE OF DISCHARGE: PV Angiogram/Intervention History obtained from chart review.[name] is a 61 y.o.  mildly overweight single, though lives with his girlfriend [name], [name] male father of one child seen for peripheral vascular disease because of claudication and diminished ABIs.  I last saw him in the office 04/12/2021 .  Risk factors include continued tobacco abuse of one pack -2 pks per day, diabetes and hyperlipidemia. He was catheterized by Dr. Nicanor Reed 08/19/2012 demonstrated three-vessel disease. He ultimately underwent coronary artery bypass grafting x4 January 17 for LIMA to his LAD, a vein to the diagonal branch, ramus intermedius branch and to the RCA. He tolerated the procedure well. His EF  was 50-55% by 2-D echo. Dopplers revealed ABIs of 0.5 and 0.7. He complaind of left greater than right claudication at one block. He underwent percutaneous revascularization using the Viance CTO catheter, diamondback orbital rotational atherectomy, chocolate balloon and IDEV Stent successfully to the mid Rt SFA. He enjoyed an excellent angiographic and clinical result with improvement Dopplers. He underwent an attempt at percutaneous revascularization of his long segment calcified left SFA CTO for lifestyle limiting claudication.I was unable to cross the lesion with a Viance CTO catheter.the patient presents in followup. Continues to have left lower extremity claudication. Also continues to smoke. We discussed options including retrograde attempt at history catheterization by Dr. Brunetta Jeans in [place] versus femoropopliteal bypass grafting. Since she's had coronary bypass grafting, I suspect he does not have adequate venous conduits left to perform lower shimmy surgical room randomization and what was thought to require PTFE. I did send him to [facility] where Dr. Brunetta Jeans been 6 hours revascularizing a left SFA CTO successfully.  He is continue to smoke a pack a day.  Over the last several months he has noticed increasing left greater than right lower extremity lifestyle of any  claudication with recent Doppler studies performed 03/16/2021 revealing a right ABI of 0.80 and a left of 0.74.  He does have a high-frequency signal in his left SFA.  He wishes to proceed with outpatient endovascular therapy.  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  He underwent orbital atherectomy followed by VBX covered stenting (8 mm x 29 mm).  He was discharged home the following day.  His Dopplers performed 04/03/2021 revealed a widely patent stent.  His claudication resolved after that time.  He does unfortunately continue to  smoke.  Since I saw him a year ago he has had recurrent claudication symptoms beginning of this year.  He had Dopplers performed 02/20/2022 revealing right ABI of 0.73 and a left ABI of 0.61.  He does have a high-frequency signal in his proximal left SFA with patent SFA stents bilaterally.  He wishes to proceed with outpatient peripheral angiography.  Pre Procedure Diagnosis: Peripheral arterial disease Post Procedure Diagnosis: Peripheral arterial disease Operators: Dr. Quay Reed Procedures Performed:  1.  Abdominal aortogram/bilateral iliac angiogram/bifemoral runoff  2.  Contralateral access (second-order catheter placement)  3.  Placement of spider distal protection device left above-the-knee popliteal artery  4.  Chocolate balloon angioplasty followed by [facility] left SFA PROCEDURE DESCRIPTION: The patient was brought to the second floor [facility] cath lab in the the postabsorptive state. He was premedicated with IV Versed and fentanyl. His right groin was prepped and shaved in usual sterile fashion. Xylocaine 1% was used for local anesthesia. A 5 French sheath was inserted into the right common femoral artery using standard Seldinger technique.  A 5 French pigtail catheter was placed in distal abdominal aorta.  Distal abdominal aortography, bilateral iliac angiography with bifemoral runoff was performed using bolus chase, digital subtraction and step table technique.  Omnipaque dye was used for the entirety of the case (150 cc contrast total to patient).  Retrograde aortic pressures monitored to the case.  Angiographic Data: 1: Abdominal aorta-the distal abdominal aorta was widely patent 2: Left lower extremity-the proximal left common iliac artery stent was widely patent.  There was a 30 to 40% segmental stenosis in the left extrailiac artery.  There was a 99% calcified focal stenosis in the proximal left SFA followed by a widely patent stent in the mid left SFA with three-vessel runoff 3: Right lower  extremity-patent mid right SFA stent with 90% calcified stenosis just beyond this and two-vessel runoff   [name] has patent left common iliac and bilateral SFA stents.  He has had progression of disease in his proximal left SFA with a 99% subtotally occluded calcified exophytic plaque.  We will proceed with shockwave balloon angioplasty followed by [facility] using spider distal protection. Procedure Description: Contralateral access was obtained with a 6 French 45 cm multipurpose Ansell sheath.  The patient received a total of 9000 units of heparin with an ending ACT of 293.  He was already on aspirin and clopidogrel. I was able to cross the lesion with mild difficulty using a 6 g 0.14 Shepperd wire.  I then predilated with a 2.5 mm x 2 cm balloon.  I placed a 6 mm spider distal protection device in the left above-the-knee popliteal artery.  I then performed balloon angioplasty with a 5 mm x 4 cm chocolate balloon at nominal pressures followed by a 5 mm x 6 cm impact Admiral DCB at 5 atm for 2 and  half minutes resulting in reduction of a 99% subtotally occluded calcified fairly focal proximal left SFA stenosis to less than 30% residual with a small linear nonflow limiting dissection.  The patient tolerated procedure well.  This Berry distal protection device was then captured and removed the body.  There was a small lateral plaque that was was noted to be captured within the distal protection device.  The Ansell sheath was similarly withdrawn across the bifurcation over an 035 versa core wire exchanged for short 6 French sheath was which was then secured in place. Final Impression: Successful proximal calcified subtotally occluded left SFA stenosis PTA/DCB using spider distal protection with acceptable angiographic result.  The sheath will be removed once ACT falls below 170 and pressure held.  He will be hydrated overnight, discharged home in the morning.  We will get lower extremity arterial Doppler studies in  unrefined office next week and I will see him back in the office 1 to 2 weeks thereafter. [name]. MD, [facility] 04/12/2022 9:22 AM     Disposition   Pt is being discharged home today in good condition.  Follow-up Plans & Appointments     Follow-up [facility] Follow up on 04/23/2022.   Specialty: Cardiology Why: at 9:30am for your follow up dopplers Contact information: [address] [place] [id number] [place] [address] [phone]        Lorretta Harp, MD Follow up on 04/25/2022.   Specialties: Cardiology, Radiology Why: at 1:20pm for your follow up appt Contact information: [address] [place] [address] [phone]                Discharge Instructions     Call MD for:  redness, tenderness, or signs of infection (pain, swelling, redness, odor or green/yellow discharge around incision site)   Complete by: As directed    Diet - low sodium heart healthy   Complete by: As directed    Discharge instructions   Complete by: As directed    Groin Site Care Refer to this sheet in the next few weeks. These instructions provide you with information on caring for yourself after your procedure. Your caregiver may also give you more specific instructions. Your treatment has been planned according to current medical practices, but problems sometimes occur. Call your caregiver if you have any problems or questions after your procedure. HOME CARE INSTRUCTIONS You may shower 24 hours after the procedure. Remove the bandage (dressing) and gently wash the site with plain soap and water. Gently pat the site dry.  Do not apply powder or lotion to the site.  Do not sit in a bathtub, swimming pool, or whirlpool for 5 to 7 days.  No bending, squatting, or lifting anything over 10 pounds (4.5 kg) as directed by your caregiver.  Inspect the site at least twice daily.   Do not drive home if you are discharged the same day of the procedure. Have someone else drive you.  You may drive 24 hours after the procedure unless otherwise instructed by your caregiver.  What to expect: Any bruising will usually fade within 1 to 2 weeks.  Blood that collects in the tissue (hematoma) may be painful to the touch. It should usually decrease in size and tenderness within 1 to 2 weeks.  SEEK IMMEDIATE MEDICAL CARE IF: You have unusual pain at the groin site or down the  affected leg.  You have redness, warmth, swelling, or pain at the groin site.  You have drainage (other than a small amount of blood on the dressing).  You have chills.  You have a fever or persistent symptoms for more than 72 hours.  You have a fever and your symptoms suddenly get worse.  Your leg becomes pale, cool, tingly, or numb.  You have heavy bleeding from the site. Hold pressure on the site. Marland Kitchen  PLEASE DO NOT MISS ANY DOSES OF YOUR PLAVIX!!!!! Also keep a log of you blood pressures and bring back to your follow up appt. Please call the office with any questions.   Patients taking blood thinners should generally stay away from medicines like ibuprofen, Advil, Motrin, naproxen, and Aleve due to risk of stomach bleeding. You may take Tylenol as directed or talk to your primary doctor about alternatives.   PLEASE ENSURE THAT YOU DO NOT RUN OUT OF YOUR PLAVIX. IF you have issues obtaining this medication due to cost please CALL the office 3-5 business days prior to running out in order to prevent missing doses of this medication.   Increase activity slowly   Complete by: As directed        Discharge Medications   Allergies as of 04/13/2022   No Known Allergies      Medication List     TAKE these medications    acetaminophen 650 MG CR tablet Commonly known as: TYLENOL Take 650 mg by mouth every 8 (eight) hours as needed for pain.   aspirin EC 81 MG tablet Take 1 tablet (81 mg total) by  mouth daily.   carboxymethylcellulose 0.5 % Soln Commonly known as: REFRESH PLUS Place 1 drop into both eyes 3 (three) times daily as needed (dry eyes).   clopidogrel 75 MG tablet Commonly known as: PLAVIX Take 1 tablet (75 mg total) by mouth daily with breakfast.   cyclobenzaprine 10 MG tablet Commonly known as: FLEXERIL Take 1 tablet (10 mg total) by mouth 3 (three) times daily as needed for muscle spasms.   docusate sodium 100 MG capsule Commonly known as: COLACE Take 100 mg by mouth daily as needed for moderate constipation.   empagliflozin 25 MG Tabs tablet Commonly known as: JARDIANCE Take 25 mg by mouth daily.   Eysuvis 0.25 % Susp Generic drug: Loteprednol Etabonate Place 1 drop into both eyes at bedtime.   gabapentin 300 MG capsule Commonly known as: NEURONTIN Take 300 mg by mouth at bedtime.   HumaLOG Mix 75/25 KwikPen (75-25) 100 UNIT/ML Kwikpen Generic drug: Insulin Lispro Prot & Lispro Inject 40 Units into the skin in the morning and at bedtime.   icosapent Ethyl 1 g capsule Commonly known as: VASCEPA TAKE (2) CAPSULES BY MOUTH TWICE DAILY.   isosorbide mononitrate 60 MG 24 hr tablet Commonly known as: IMDUR Take 1 tablet (60 mg total) by mouth daily.   Lantus SoloStar 100 UNIT/ML Solostar Pen Generic drug: insulin glargine Inject 30 Units into the skin 2 (two) times daily.   losartan 25 MG tablet Commonly known as: COZAAR Take 1 tablet (25 mg total) by mouth daily.   metoprolol succinate 25 MG 24 hr tablet Commonly known as: TOPROL-XL TAKE 1 TABLET BY MOUTH ONCE A DAY.   Myrbetriq 25 MG Tb24 tablet Generic drug: mirabegron ER Take 1 tablet (25 mg total) by mouth daily.   OneTouch Delica Plus [id number] Misc Apply topically 2 (two) times daily.   OneTouch Delica Plus [id number] Misc Apply  topically daily.   OneTouch Verio Flex System w/Device Kit   OneTouch Verio test strip Generic drug: glucose blood 2 (two) times daily.    oxyCODONE-acetaminophen 10-325 MG tablet Commonly known as: PERCOCET Take 1 tablet by mouth 4 (four) times daily as needed for pain.   ProAir HFA 108 (90 Base) MCG/ACT inhaler Generic drug: albuterol Inhale 2 puffs into the lungs every 6 (six) hours as needed for wheezing or shortness of breath.   rosuvastatin 40 MG tablet Commonly known as: CRESTOR Take 1 tablet (40 mg total) by mouth daily.   zolpidem 12.5 MG CR tablet Commonly known as: AMBIEN CR Take 12.5 mg by mouth at bedtime.        Outstanding Labs/Studies   Follow up dopplers  Duration of Discharge Encounter   Greater than 30 minutes including physician time.  Signed, Reino Bellis, NP 04/13/2022, 9:34 AM  I have examined the patient and reviewed assessment and plan and discussed with patient.  Agree with above as stated.    Groin site stable.  No hematoma.  Legs appear warm and well perfused.  Difficult to palpate pedal pulses but some below the knee disease noted.  I stressed the importance of dual antiplatelet therapy with clopidogrel.  I also stressed the importance of stopping smoking.  High-dose statin for lipid-lowering therapy.  He is felt well walking in his room.  Follow-up ultrasounds scheduled.  Plan for discharge later today.  Larae Grooms

## 2022-04-13 NOTE — Plan of Care (Signed)
  Problem: Activity: Goal: Ability to return to baseline activity level will improve Outcome: Progressing   Problem: Cardiovascular: Goal: Vascular access site(s) Level 0-1 will be maintained Outcome: Progressing   

## 2022-04-23 ENCOUNTER — Ambulatory Visit (HOSPITAL_COMMUNITY)
Admission: RE | Admit: 2022-04-23 | Discharge: 2022-04-23 | Disposition: A | Discharge status: Home / Self Care | Payer: Medicare Other | Source: Ambulatory Visit | Attending: Cardiology | Admitting: Cardiology

## 2022-04-23 DIAGNOSIS — I739 Peripheral vascular disease, unspecified: Secondary | ICD-10-CM | POA: Diagnosis not present

## 2022-04-25 ENCOUNTER — Ambulatory Visit: Payer: Medicare Other | Attending: Cardiovascular Disease | Admitting: Cardiovascular Disease

## 2022-04-25 ENCOUNTER — Encounter: Payer: Self-pay | Admitting: Cardiovascular Disease

## 2022-04-25 DIAGNOSIS — Z720 Tobacco use: Secondary | ICD-10-CM

## 2022-04-25 DIAGNOSIS — I739 Peripheral vascular disease, unspecified: Secondary | ICD-10-CM | POA: Diagnosis not present

## 2022-04-25 MED ORDER — ISOSORBIDE MONONITRATE ER 30 MG PO TB24: 30.0000 mg | ORAL_TABLET | Freq: Every day | ORAL | 3 refills | Status: DC

## 2022-04-25 NOTE — Progress Notes (Signed)
04/25/2022 [name]   Sep 28, 1960  [id number]  Primary Physician Pablo Lawrence, NP Primary Cardiologist: Lorretta Harp MD [name], [place]  HPI:  [name] is a 61 y.o.  mildly overweight single, though lives with his girlfriend Ivin Booty, [place] male father of one child seen for peripheral vascular disease because of claudication and diminished ABIs.  I last saw him in the office 03/19/2022  Risk factors include continued tobacco abuse of one pack -2 pks per day, diabetes and hyperlipidemia. He was catheterized by Dr. Nicanor Bake 08/19/2012 demonstrated three-vessel disease. He ultimately underwent coronary artery bypass grafting x4 January 17 for LIMA to his LAD, a vein to the diagonal branch, ramus intermedius branch and to the RCA. He tolerated the procedure well. His EF was 50-55% by 2-D echo. Dopplers revealed ABIs of 0.5 and 0.7. He complaind of left greater than right claudication at one block. He underwent percutaneous revascularization using the Viance CTO catheter, diamondback orbital rotational atherectomy, chocolate balloon and IDEV Stent successfully to the mid Rt SFA. He enjoyed an excellent angiographic and clinical result with improvement Dopplers. He underwent an attempt at percutaneous revascularization of his long segment calcified left SFA CTO for lifestyle limiting claudication.I was unable to cross the lesion with a Viance CTO catheter.the patient presents in followup. Continues to have left lower extremity claudication. Also continues to smoke. We discussed options including retrograde attempt at history catheterization by Dr. Brunetta Jeans in [place] versus femoropopliteal bypass grafting. Since she's had coronary bypass grafting, I suspect he does not have adequate venous conduits left to perform lower shimmy surgical room randomization and what was thought to require PTFE.  I did send him to [facility] where Dr. Brunetta Jeans been 6 hours revascularizing a left  SFA CTO successfully.  He is continue to smoke a pack a day.  Over the last several months he has noticed increasing left greater than right lower extremity lifestyle of any claudication with recent Doppler studies performed 03/16/2021 revealing a right ABI of 0.80 and a left of 0.74.  He does have a high-frequency signal in his left SFA.  He wishes to proceed with outpatient endovascular therapy.   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  He underwent orbital atherectomy followed by VBX covered stenting (8 mm x 29 mm).  He was discharged home the following day.  His Dopplers performed 04/03/2021 revealed a widely patent stent.  His claudication resolved after that time.  He does unfortunately continue to smoke.   He has had recurrent claudication symptoms beginning of this year.  He had Dopplers performed 02/20/2022 revealing right ABI of 0.73 and a left ABI of 0.61.  He does have a high-frequency signal in his proximal left SFA with patent SFA stents bilaterally.  He wishes to proceed with outpatient peripheral angiography.  I performed angiography and intervention on him 04/12/2022 revealing patent left common iliac artery stent, patent bilateral SFA stents and a subtotally occluded calcified proximal left left SFA stenosis which I intervened on using chocolate balloon angioplasty followed by DCB.  His claudication has resolved and his Dopplers have markedly improved.  He does continue to smoke unfortunately.   Current Meds  Medication Sig   acetaminophen (TYLENOL) 650 MG CR tablet Take 650 mg by mouth every 8 (eight) hours as needed for pain.   aspirin EC 81 MG EC tablet Take 1 tablet (81  mg total) by mouth daily.   Blood Glucose Monitoring Suppl (ONETOUCH VERIO FLEX SYSTEM) w/Device KIT    carboxymethylcellulose (REFRESH PLUS) 0.5 % SOLN Place 1 drop into both eyes 3 (three) times daily as needed (dry  eyes).   clopidogrel (PLAVIX) 75 MG tablet Take 1 tablet (75 mg total) by mouth daily with breakfast.   cyclobenzaprine (FLEXERIL) 10 MG tablet Take 1 tablet (10 mg total) by mouth 3 (three) times daily as needed for muscle spasms.   docusate sodium (COLACE) 100 MG capsule Take 100 mg by mouth daily as needed for moderate constipation.   empagliflozin (JARDIANCE) 25 MG TABS tablet Take 25 mg by mouth daily.   gabapentin (NEURONTIN) 300 MG capsule Take 300 mg by mouth at bedtime.   HUMALOG MIX 75/25 KWIKPEN (75-25) 100 UNIT/ML Kwikpen Inject 40 Units into the skin in the morning and at bedtime.   icosapent Ethyl (VASCEPA) 1 g capsule TAKE (2) CAPSULES BY MOUTH TWICE DAILY.   Lancets (ONETOUCH DELICA PLUS [id number]) MISC Apply topically daily.   Lancets (ONETOUCH DELICA PLUS [id number]) MISC Apply topically 2 (two) times daily.   LANTUS SOLOSTAR 100 UNIT/ML Solostar Pen Inject 30 Units into the skin 2 (two) times daily.   losartan (COZAAR) 25 MG tablet Take 1 tablet (25 mg total) by mouth daily.   Loteprednol Etabonate (EYSUVIS) 0.25 % SUSP Place 1 drop into both eyes at bedtime.   metoprolol succinate (TOPROL-XL) 25 MG 24 hr tablet TAKE 1 TABLET BY MOUTH ONCE A DAY.   MYRBETRIQ 25 MG TB24 tablet Take 1 tablet (25 mg total) by mouth daily.   ONETOUCH VERIO test strip 2 (two) times daily.   oxyCODONE-acetaminophen (PERCOCET) 10-325 MG tablet Take 1 tablet by mouth 4 (four) times daily as needed for pain.   PROAIR HFA 108 (90 BASE) MCG/ACT inhaler Inhale 2 puffs into the lungs every 6 (six) hours as needed for wheezing or shortness of breath.    rosuvastatin (CRESTOR) 40 MG tablet Take 1 tablet (40 mg total) by mouth daily.   zolpidem (AMBIEN CR) 12.5 MG CR tablet Take 12.5 mg by mouth at bedtime.   [DISCONTINUED] isosorbide mononitrate (IMDUR) 60 MG 24 hr tablet Take 1 tablet (60 mg total) by mouth daily.     No Known Allergies  Social History   Socioeconomic History   Marital status: Single     Spouse name: Not on file   Number of children: 1   Years of education: Not on file   Highest education level: Not on file  Occupational History   Occupation: disabled    Employer: DISABLED  Tobacco Use   Smoking status: Every Day    Packs/day: 1.00    Years: 39.00    Total pack years: 39.00    Types: Cigarettes   Smokeless tobacco: Never  Vaping Use   Vaping Use: Never used  Substance and Sexual Activity   Alcohol use: Yes    Alcohol/week: 6.0 standard drinks of alcohol    Types: 6 Cans of beer per week   Drug use: No   Sexual activity: Not Currently  Other Topics Concern   Not on file  Social History Narrative   Lives w/ girlfriend   Social Determinants of Health   Financial Resource Strain: Not on file  Food Insecurity: Not on file  Transportation Needs: Not on file  Physical Activity: Not on file  Stress: Not on file  Social Connections: Not on file  Intimate Partner Violence: Not on  file     Review of Systems: General: negative for chills, fever, night sweats or weight changes.  Cardiovascular: negative for chest pain, dyspnea on exertion, edema, orthopnea, palpitations, paroxysmal nocturnal dyspnea or shortness of breath Dermatological: negative for rash Respiratory: negative for cough or wheezing Urologic: negative for hematuria Abdominal: negative for nausea, vomiting, diarrhea, bright red blood per rectum, melena, or hematemesis Neurologic: negative for visual changes, syncope, or dizziness All other systems reviewed and are otherwise negative except as noted above.    Blood pressure 126/66, pulse 68, height '5\' 11"'  (1.803 m), weight 193 lb (87.5 kg).  General appearance: alert and no distress Neck: no adenopathy, no carotid bruit, no JVD, supple, symmetrical, trachea midline, and thyroid not enlarged, symmetric, no tenderness/mass/nodules Lungs: clear to auscultation bilaterally Heart: regular rate and rhythm, S1, S2 normal, no murmur, click, rub or  gallop Extremities: extremities normal, atraumatic, no cyanosis or edema Pulses: 2+ and symmetric Skin: Skin color, texture, turgor normal. No rashes or lesions Neurologic: Grossly normal  EKG not performed today  ASSESSMENT AND PLAN:   Tobacco abuse Ongoing tobacco abuse recalcitrant to risk factor modification.  Claudication in peripheral vascular disease [facility]) Mr. Lattanzio returns today for follow-up after his recent endovascular procedure which I performed 04/12/2022.  He has had bilateral SFA interventions by myself as well as orbital atherectomy, VBX covered stenting of a high-grade calcified ostial/proximal left common iliac artery stenosis on 03/30/2021.  Dopplers performed 02/20/2022 revealed right ABI of 0.73 and a left of 0.61.  His angiogram revealed a subtle totally occluded calcified proximal left SFA stenosis.  His iliac stent was patent as were both SFA stents.  He did have a 90% calcified stenosis just beyond his right SFA stent although he is asymptomatic on that side.  I performed chocolate balloon angioplasty following by [facility] of his proximal left SFA stenosis with excellent angiographic result and clinical result.  His claudication has resolved.  His Dopplers are markedly improved.  His left ABI is increased from 0.61 up to 0.85.     Lorretta Harp MD [facility], [facility] 04/25/2022 1:43 PM

## 2022-04-25 NOTE — Assessment & Plan Note (Signed)
Ongoing tobacco abuse recalcitrant to risk factor modification. 

## 2022-04-25 NOTE — Patient Instructions (Signed)
Medication Instructions:   -Reduce isosorbide mononitrate (imdur) to '30mg'$  once daily.   *If you need a refill on your cardiac medications before your next appointment, please call your pharmacy*   Testing/Procedures: Your physician has requested that you have a lower extremity arterial duplex. This test is an ultrasound of the arteries in the legs. It looks at arterial blood flow in the legs. Allow one hour for Lower Arterial scans. There are no restrictions or special instructions To be done in March 2024. This procedure will be done at [place]  Your physician has requested that you have an ankle brachial index (ABI). During this test an ultrasound and blood pressure cuff are used to evaluate the arteries that supply the arms and legs with blood. Allow thirty minutes for this exam. There are no restrictions or special instructions. To be done in March 2024. This procedure will be done at [place]. [address]   Follow-Up: At [facility], you and your health needs are our priority.  As part of our continuing mission to provide you with exceptional heart care, we have created designated Provider Care Teams.  These Care Teams include your primary Cardiologist (physician) and Advanced Practice Providers (APPs -  Physician Assistants and Nurse Practitioners) who all work together to provide you with the care you need, when you need it.  We recommend signing up for the patient portal called "MyChart".  Sign up information is provided on this After Visit Summary.  MyChart is used to connect with patients for Virtual Visits (Telemedicine).  Patients are able to view lab/test results, encounter notes, upcoming appointments, etc.  Non-urgent messages can be sent to your provider as well.   To learn more about what you can do with MyChart, go to [email or web address].    Your next appointment:   12 month(s)  The format for your next appointment:   In  Person  Provider:   [name], MD

## 2022-04-25 NOTE — Assessment & Plan Note (Signed)
[name] returns today for follow-up after his recent endovascular procedure which I performed 04/12/2022.  He has had bilateral SFA interventions by myself as well as orbital atherectomy, VBX covered stenting of a high-grade calcified ostial/proximal left common iliac artery stenosis on 03/30/2021.  Dopplers performed 02/20/2022 revealed right ABI of 0.73 and a left of 0.61.  His angiogram revealed a subtle totally occluded calcified proximal left SFA stenosis.  His iliac stent was patent as were both SFA stents.  He did have a 90% calcified stenosis just beyond his right SFA stent although he is asymptomatic on that side.  I performed chocolate balloon angioplasty following by [facility] of his proximal left SFA stenosis with excellent angiographic result and clinical result.  His claudication has resolved.  His Dopplers are markedly improved.  His left ABI is increased from 0.61 up to 0.85.

## 2022-04-27 DIAGNOSIS — G4452 New daily persistent headache (NDPH): Secondary | ICD-10-CM | POA: Diagnosis not present

## 2022-04-27 DIAGNOSIS — R21 Rash and other nonspecific skin eruption: Secondary | ICD-10-CM | POA: Diagnosis not present

## 2022-05-01 DIAGNOSIS — I671 Cerebral aneurysm, nonruptured: Secondary | ICD-10-CM | POA: Diagnosis not present

## 2022-05-01 DIAGNOSIS — I6522 Occlusion and stenosis of left carotid artery: Secondary | ICD-10-CM | POA: Diagnosis not present

## 2022-05-11 ENCOUNTER — Other Ambulatory Visit: Payer: Self-pay | Admitting: *Deleted

## 2022-05-11 DIAGNOSIS — I6529 Occlusion and stenosis of unspecified carotid artery: Secondary | ICD-10-CM

## 2022-05-14 ENCOUNTER — Other Ambulatory Visit (HOSPITAL_COMMUNITY): Payer: Self-pay | Admitting: Neurosurgery

## 2022-05-14 ENCOUNTER — Other Ambulatory Visit: Payer: Self-pay | Admitting: Neurosurgery

## 2022-05-14 DIAGNOSIS — I6010 Nontraumatic subarachnoid hemorrhage from unspecified middle cerebral artery: Secondary | ICD-10-CM | POA: Diagnosis not present

## 2022-05-16 ENCOUNTER — Ambulatory Visit (INDEPENDENT_AMBULATORY_CARE_PROVIDER_SITE_OTHER): Payer: Medicare Other

## 2022-05-16 ENCOUNTER — Encounter (HOSPITAL_COMMUNITY): Payer: Self-pay | Admitting: Neurosurgery

## 2022-05-16 ENCOUNTER — Other Ambulatory Visit: Payer: Self-pay | Admitting: Internal Medicine

## 2022-05-16 ENCOUNTER — Other Ambulatory Visit: Payer: Self-pay | Admitting: Neurosurgery

## 2022-05-16 ENCOUNTER — Encounter: Payer: Self-pay | Admitting: Vascular Surgery

## 2022-05-16 ENCOUNTER — Ambulatory Visit (INDEPENDENT_AMBULATORY_CARE_PROVIDER_SITE_OTHER): Payer: Medicare Other | Admitting: Vascular Surgery

## 2022-05-16 ENCOUNTER — Other Ambulatory Visit: Payer: Self-pay

## 2022-05-16 VITALS — BP 142/83 | HR 70 | Temp 98.2°F | Ht 71.0 in | Wt 190.0 lb

## 2022-05-16 DIAGNOSIS — I6529 Occlusion and stenosis of unspecified carotid artery: Secondary | ICD-10-CM

## 2022-05-16 NOTE — Anesthesia Preprocedure Evaluation (Addendum)
Anesthesia Evaluation  Patient identified by MRN, date of birth, ID band Patient awake    Reviewed: Allergy & Precautions, H&P , NPO status , Patient's Chart, lab work & pertinent test results  Airway Mallampati: II   Neck ROM: full    Dental   Pulmonary COPD, Current Smoker and Patient abstained from smoking.,    breath sounds clear to auscultation       Cardiovascular hypertension, + CAD, + CABG and + Peripheral Vascular Disease   Rhythm:regular Rate:Normal     Neuro/Psych  Headaches, Cerebral aneurysm    GI/Hepatic hiatal hernia, GERD  ,  Endo/Other  diabetes, Type 2  Renal/GU      Musculoskeletal  (+) Arthritis ,   Abdominal   Peds  Hematology   Anesthesia Other Findings   Reproductive/Obstetrics                            Anesthesia Physical Anesthesia Plan  ASA: 3  Anesthesia Plan: General   Post-op Pain Management:    Induction: Intravenous  PONV Risk Score and Plan: 1 and Ondansetron, Dexamethasone, Midazolam and Treatment may vary due to age or medical condition  Airway Management Planned: Oral ETT  Additional Equipment: Arterial line  Intra-op Plan:   Post-operative Plan: Extubation in OR  Informed Consent: I have reviewed the patients History and Physical, chart, labs and discussed the procedure including the risks, benefits and alternatives for the proposed anesthesia with the patient or authorized representative who has indicated his/her understanding and acceptance.     Dental advisory given  Plan Discussed with: CRNA, Anesthesiologist and Surgeon  Anesthesia Plan Comments: (PAT note by Karoline Caldwell, PA-C: Follows with cardiology for history of CAD s/p CABG x4 (08/2015 LIMA-LAD, SVG-diagonal, SVG-ramus intermedius, and SVG-RCA), HTN, HLD, PVD s/p multiple bilateral lower extremity interventions.  Nuclear stress 09/2018 was nonischemic, low risk.  Echo 09/2018  showed EF 60 to 65%, no significant valvular abnormalities.  He recently underwent successful intervention of proximal calcified subtotally occluded left SFA stenosis with PTA/DCB by Dr. [name] on 04/12/2022.  Recently evaluated by PCP for persistent headaches.  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  Moderate narrowing of the distal left internal carotid artery at the skull base was also noted.  Patient was referred to neurosurgery for further management.  IDDM 2, uncontrolled, last A1c 8.9 on 02/12/2022..  Current smoker with associated COPD.  History of C2-6 cervical fusion.  CMP 04/27/2022 in Care Everywhere reviewed, glucose elevated at 384, otherwise unremarkable.  CBC 04/27/2022 in Care Everywhere reviewed, WNL.  Patient will need day of surgery evaluation.  EKG 02/27/2022: NSR.  Rate 71.  Possible anterior infarct, age undetermined.  TTE 09/10/2018: 1. The left ventricle has normal systolic function of 55-73%. The cavity  size is normal. There is borderline increase in left ventricular wall  thickness. Echo evidence of normal diastolic relaxation.  2. The right ventricle has normal systolic function. The cavity in normal  in size. There is no increase in right ventricular wall thickness. Right  ventricular systolic pressure could not be assessed.  3. The aortic valve is tricuspid. There is mild aortic annular  calcification noted.  4. The mitral valve is normal in structure.  5. The pulmonic valve is grossly normal.   Nuclear stress 09/10/2018: . No diagnostic ST segment changes to indicate ischemia. Marland Kitchen  Very small, mild intensity, apical inferior and anterior defects that are partially reversible and most consistent with variable soft tissue attenuation rather than ischemia. No large ischemic territories are noted. . This is a low risk study. . Nuclear stress EF: 70%. )       Anesthesia  Quick Evaluation

## 2022-05-16 NOTE — Progress Notes (Signed)
Anesthesia Chart Review: Same day workup  Follows with cardiology for history of CAD s/p CABG x4 (08/2015 LIMA-LAD, SVG-diagonal, SVG-ramus intermedius, and SVG-RCA), HTN, HLD, PVD s/p multiple bilateral lower extremity interventions.  Nuclear stress 09/2018 was nonischemic, low risk.  Echo 09/2018 showed EF 60 to 65%, no significant valvular abnormalities.  He recently underwent successful intervention of proximal calcified subtotally occluded left SFA stenosis with PTA/DCB by Dr. [name] on 04/12/2022.  Recently evaluated by PCP for persistent headaches.  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  Moderate narrowing of the distal left internal carotid artery at the skull base was also noted.  Patient was referred to neurosurgery for further management.  IDDM 2, uncontrolled, last A1c 8.9 on 02/12/2022..  Current smoker with associated COPD.  History of C2-6 cervical fusion.  CMP 04/27/2022 in Care Everywhere reviewed, glucose elevated at 384, otherwise unremarkable.  CBC 04/27/2022 in Care Everywhere reviewed, WNL.  Patient will need day of surgery evaluation.  EKG 02/27/2022: NSR.  Rate 71.  Possible anterior infarct, age undetermined.  TTE 09/10/2018:  1. The left ventricle has normal systolic function of 24-49%. The cavity  size is normal. There is borderline increase in left ventricular wall  thickness. Echo evidence of normal diastolic relaxation.   2. The right ventricle has normal systolic function. The cavity in normal  in size. There is no increase in right ventricular wall thickness. Right  ventricular systolic pressure could not be assessed.   3. The aortic valve is tricuspid. There is mild aortic annular  calcification noted.   4. The mitral valve is normal in structure.   5. The pulmonic valve is grossly normal.   Nuclear stress 09/10/2018: No diagnostic ST segment changes to indicate ischemia. Very  small, mild intensity, apical inferior and anterior defects that are partially reversible and most consistent with variable soft tissue attenuation rather than ischemia. No large ischemic territories are noted. This is a low risk study. Nuclear stress EF: 70%.   Wynonia Musty [facility] Short Stay Center/Anesthesiology Phone [phone] 05/16/2022 11:07 AM

## 2022-05-16 NOTE — Progress Notes (Signed)
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 male, who is here today for evaluation.  He is here today with his girlfriend.  He was experiencing severe headache.  He underwent CT scan on 05/01/2022.  This revealed middle cerebral artery aneurysm and is scheduled for coiling of this tomorrow at [facility].  He also had some evidence of extracranial disease and is here today for further evaluation of this.  He is right-handed.  He has no history of stroke.  He is status post coronary artery bypass grafting in 2014 and has a history of prior peripheral iliac interventions.  Past Medical History:  Diagnosis Date   Arthritis    "some; in hips sometimes" (01/02/2013)   Back pain    "w/prolonged walking or standing" (01/02/2013)   CAD (coronary artery disease)    CABG X 4 08/2012   COPD (chronic obstructive pulmonary disease) (HCC)    Elevated PSA    Esophageal erosions 05/24/01   egd by Dr. Gala Romney   GERD (gastroesophageal reflux disease)    H/O hiatal hernia    History of gout    Hyperlipidemia    Hypertension    not on any medication for htn at present   PAD (peripheral artery disease) ([place])    LEA DOPPLER, 11/11/2012 - moderate arterial insufficiency to lower extremities at rest, BILATERAL SFA-demonstrates occlusive disease with reconstitution of flow   S/P percutaneous transluminal angioplasty (PTA) with stent placement 03/29/21 03/31/2021   Tobacco abuse    Tubular adenoma of colon 08/28/11   Type II diabetes mellitus ([place])    Unintentional weight loss     Family History  Problem Relation Age of Onset   Anesthesia problems Neg Hx    Colon cancer Neg Hx    Gastric cancer Neg Hx    Esophageal cancer Neg Hx     SOCIAL HISTORY: Social History   Socioeconomic  History   Marital status: Single    Spouse name: Not on file   Number of children: 1   Years of education: Not on file   Highest education level: Not on file  Occupational History   Occupation: disabled    Employer: DISABLED  Tobacco Use   Smoking status: Every Day    Packs/day: 1.00    Years: 39.00    Total pack years: 39.00    Types: Cigarettes   Smokeless tobacco: Never  Vaping Use   Vaping Use: Never used  Substance and Sexual Activity   Alcohol use: Yes    Alcohol/week: 6.0 standard drinks of alcohol    Types: 6 Cans of beer per week   Drug use: No   Sexual activity: Not Currently  Other Topics Concern   Not on file  Social History Narrative   Lives w/ girlfriend   Social Determinants of Health   Financial Resource Strain: Not on file  Food Insecurity: Not on file  Transportation Needs: Not on file  Physical Activity: Not on file  Stress: Not on file  Social Connections: Not on file  Intimate Partner Violence: Not on file    No Known Allergies  Current Outpatient Medications  Medication Sig Dispense Refill   acetaminophen (TYLENOL) 650 MG CR tablet Take 650 mg by mouth every 8 (eight) hours as needed for pain.     aspirin EC  81 MG EC tablet Take 1 tablet (81 mg total) by mouth daily.     Blood Glucose Monitoring Suppl (ONETOUCH VERIO FLEX SYSTEM) w/Device KIT      carboxymethylcellulose (REFRESH PLUS) 0.5 % SOLN Place 1 drop into both eyes 3 (three) times daily as needed (dry eyes).     clopidogrel (PLAVIX) 75 MG tablet Take 1 tablet (75 mg total) by mouth daily with breakfast. 30 tablet 11   cyclobenzaprine (FLEXERIL) 10 MG tablet Take 1 tablet (10 mg total) by mouth 3 (three) times daily as needed for muscle spasms. 50 tablet 1   docusate sodium (COLACE) 100 MG capsule Take 100 mg by mouth daily as needed for moderate constipation.     empagliflozin (JARDIANCE) 25 MG TABS tablet Take 25 mg by mouth daily.     gabapentin (NEURONTIN) 300 MG capsule Take 300 mg  by mouth at bedtime.     HUMALOG MIX 75/25 KWIKPEN (75-25) 100 UNIT/ML Kwikpen Inject 40 Units into the skin in the morning and at bedtime.     icosapent Ethyl (VASCEPA) 1 g capsule TAKE (2) CAPSULES BY MOUTH TWICE DAILY. 120 capsule 11   isosorbide mononitrate (IMDUR) 30 MG 24 hr tablet Take 1 tablet (30 mg total) by mouth daily. 90 tablet 3   Lancets (ONETOUCH DELICA PLUS STMHDQ22W) MISC Apply topically daily.     Lancets (ONETOUCH DELICA PLUS [id number]) MISC Apply topically 2 (two) times daily.     LANTUS SOLOSTAR 100 UNIT/ML Solostar Pen Inject 30 Units into the skin 2 (two) times daily.     losartan (COZAAR) 50 MG tablet Take 50 mg by mouth daily.     Loteprednol Etabonate (EYSUVIS) 0.25 % SUSP Place 1 drop into both eyes at bedtime.     metoprolol succinate (TOPROL-XL) 25 MG 24 hr tablet TAKE 1 TABLET BY MOUTH ONCE A DAY. 90 tablet 3   MYRBETRIQ 25 MG TB24 tablet Take 1 tablet (25 mg total) by mouth daily. 90 tablet 3   ONETOUCH VERIO test strip 2 (two) times daily.     oxyCODONE-acetaminophen (PERCOCET) 10-325 MG tablet Take 1 tablet by mouth 4 (four) times daily as needed for pain.     PROAIR HFA 108 (90 BASE) MCG/ACT inhaler Inhale 2 puffs into the lungs every 6 (six) hours as needed for wheezing or shortness of breath.      rosuvastatin (CRESTOR) 40 MG tablet Take 1 tablet (40 mg total) by mouth daily. 30 tablet 1   zolpidem (AMBIEN CR) 12.5 MG CR tablet Take 12.5 mg by mouth at bedtime.     No current facility-administered medications for this visit.    REVIEW OF SYSTEMS:  '[X]'  denotes positive finding, '[ ]'  denotes negative finding Cardiac  Comments:  Chest pain or chest pressure:    Shortness of breath upon exertion:    Short of breath when lying flat:    Irregular heart rhythm:        Vascular    Pain in calf, thigh, or hip brought on by ambulation:    Pain in feet at night that wakes you up from your sleep:     Blood clot in your veins:    Leg swelling:          Pulmonary    Oxygen at home:    Productive cough:     Wheezing:         Neurologic    Sudden weakness in arms or legs:     Sudden numbness  in arms or legs:     Sudden onset of difficulty speaking or slurred speech:    Temporary loss of vision in one eye:     Problems with dizziness:         Gastrointestinal    Blood in stool:     Vomited blood:         Genitourinary    Burning when urinating:     Blood in urine:        Psychiatric    Major depression:         Hematologic    Bleeding problems:    Problems with blood clotting too easily:        Skin    Rashes or ulcers:        Constitutional    Fever or chills:      PHYSICAL EXAM: Vitals:   05/16/22 1031 05/16/22 1036  BP: 130/74 (!) 142/83  Pulse: 70   Temp: 98.2 F (36.8 C)   SpO2: 92%   Weight: 190 lb (86.2 kg)   Height: '5\' 11"'  (1.803 m)     GENERAL: The patient is a well-nourished male, in no acute distress. The vital signs are documented above. CARDIOVASCULAR: 2+ radial pulses bilaterally.  I do not appreciate carotid bruits bilaterally. PULMONARY: There is good air exchange  MUSCULOSKELETAL: There are no major deformities or cyanosis. NEUROLOGIC: No focal weakness or paresthesias are detected. SKIN: There are no ulcers or rashes noted. PSYCHIATRIC: The patient has a normal affect.  DATA:  Carotid duplex today in our office reveals no evidence of severe disease.  Moderate carotid stenosis on the left.  CT scan from 05/01/2022 reveals moderate distal carotid stenosis at the skull base on the left and mild disease on the right.  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  Have recommended yearly follow-up to rule out progression of asymptomatic disease.  He also knows to report immediately should he develop any neurologic deficits.   Rosetta Posner, MD FACS [facility] [phone] Pager  [phone]  Note: Portions of this report may have been transcribed using voice recognition software.  Every effort has been made to ensure accuracy; however, inadvertent computerized transcription errors may still be present.

## 2022-05-16 NOTE — Progress Notes (Signed)
PCP - Pablo Lawrence, NP Cardiologist - Dr [name]  CT Chest x-ray - 06/15/21 EKG - 02/07/22 Stress Test - 09/10/18 ECHO - 09/10/18 Cardiac Cath - 08/19/12  ICD Pacemaker/Loop - n/a  Sleep Study -  n/a CPAP - none  Diabetes, Type 2 Do not take Jardiance today or on the morning of surgery.  Take 22 units Lantus Solostar Insulin on DOS.  THE NIGHT BEFORE SURGERY, take 31 Units Humalog Insulin.  Take 22 units Lantus Solostar Insulin.     If your blood sugar is less than 70 mg/dL, you will need to treat for low blood sugar: Treat a low blood sugar (less than 70 mg/dL) with  cup of clear juice (cranberry or apple), 4 glucose tablets, OR glucose gel. Recheck blood sugar in 15 minutes after treatment (to make sure it is greater than 70 mg/dL). If your blood sugar is not greater than 70 mg/dL on recheck, call [phone] for further instructions.  Aspirin/Plavix Instructions: Follow your surgeon's instructions on when to stop Aspirin & Plavix prior to surgery.  Per patient , MD instructed patient to continue taking these meds.  ERAS: Clear liquids til 11:40 AM DOS.  Anesthesia review: Yes  STOP now taking any Aspirin (unless otherwise instructed by your surgeon), Aleve, Naproxen, Ibuprofen, Motrin, Advil, Goody's, BC's, all herbal medications, fish oil, and all vitamins.   Coronavirus Screening Do you have any of the following symptoms:  Cough yes/no: No Fever (>100.19F)  yes/no: No Runny nose yes/no: No Sore throat yes/no: No Difficulty breathing/shortness of breath  yes/no: No  Have you traveled in the last 14 days and where? yes/no: No  Patient verbalized understanding of instructions that were given via phone.

## 2022-05-17 ENCOUNTER — Inpatient Hospital Stay (HOSPITAL_COMMUNITY): Payer: Medicare Other | Admitting: Physician Assistant

## 2022-05-17 ENCOUNTER — Inpatient Hospital Stay (HOSPITAL_COMMUNITY)
Admission: RE | Admit: 2022-05-17 | Discharge: 2022-05-17 | Disposition: A | Discharge status: Home / Self Care | Payer: Medicare Other | Source: Ambulatory Visit | Attending: Neurosurgery | Admitting: Neurosurgery

## 2022-05-17 ENCOUNTER — Inpatient Hospital Stay (HOSPITAL_COMMUNITY)
Admission: RE | Admit: 2022-05-17 | Discharge: 2022-05-18 | DRG: 027 | Disposition: A | Discharge status: Home / Self Care | Payer: Medicare Other | Attending: Neurosurgery | Admitting: Neurosurgery

## 2022-05-17 ENCOUNTER — Encounter (HOSPITAL_COMMUNITY): Payer: Self-pay | Admitting: Neurosurgery

## 2022-05-17 ENCOUNTER — Encounter (HOSPITAL_COMMUNITY)
Admission: RE | Disposition: A | Discharge status: Home / Self Care | Payer: Self-pay | Source: Home / Self Care | Attending: Neurosurgery

## 2022-05-17 DIAGNOSIS — I2510 Atherosclerotic heart disease of native coronary artery without angina pectoris: Secondary | ICD-10-CM | POA: Diagnosis not present

## 2022-05-17 DIAGNOSIS — Z951 Presence of aortocoronary bypass graft: Secondary | ICD-10-CM

## 2022-05-17 DIAGNOSIS — I10 Essential (primary) hypertension: Secondary | ICD-10-CM | POA: Diagnosis present

## 2022-05-17 DIAGNOSIS — Z794 Long term (current) use of insulin: Secondary | ICD-10-CM

## 2022-05-17 DIAGNOSIS — E785 Hyperlipidemia, unspecified: Secondary | ICD-10-CM | POA: Diagnosis not present

## 2022-05-17 DIAGNOSIS — Z981 Arthrodesis status: Secondary | ICD-10-CM

## 2022-05-17 DIAGNOSIS — K219 Gastro-esophageal reflux disease without esophagitis: Secondary | ICD-10-CM | POA: Diagnosis present

## 2022-05-17 DIAGNOSIS — Z9889 Other specified postprocedural states: Secondary | ICD-10-CM

## 2022-05-17 DIAGNOSIS — E1151 Type 2 diabetes mellitus with diabetic peripheral angiopathy without gangrene: Secondary | ICD-10-CM | POA: Diagnosis not present

## 2022-05-17 DIAGNOSIS — I6010 Nontraumatic subarachnoid hemorrhage from unspecified middle cerebral artery: Principal | ICD-10-CM

## 2022-05-17 DIAGNOSIS — F17210 Nicotine dependence, cigarettes, uncomplicated: Secondary | ICD-10-CM | POA: Diagnosis not present

## 2022-05-17 DIAGNOSIS — I671 Cerebral aneurysm, nonruptured: Principal | ICD-10-CM | POA: Diagnosis present

## 2022-05-17 DIAGNOSIS — Z7902 Long term (current) use of antithrombotics/antiplatelets: Secondary | ICD-10-CM | POA: Diagnosis not present

## 2022-05-17 DIAGNOSIS — Z7982 Long term (current) use of aspirin: Secondary | ICD-10-CM | POA: Diagnosis not present

## 2022-05-17 DIAGNOSIS — J449 Chronic obstructive pulmonary disease, unspecified: Secondary | ICD-10-CM | POA: Diagnosis not present

## 2022-05-17 DIAGNOSIS — Z7984 Long term (current) use of oral hypoglycemic drugs: Secondary | ICD-10-CM | POA: Diagnosis not present

## 2022-05-17 DIAGNOSIS — I110 Hypertensive heart disease with heart failure: Secondary | ICD-10-CM | POA: Diagnosis not present

## 2022-05-17 HISTORY — DX: Headache, unspecified: R51.9

## 2022-05-17 HISTORY — PX: IR US GUIDE VASC ACCESS RIGHT: IMG2390

## 2022-05-17 HISTORY — PX: IR TRANSCATH/EMBOLIZ: IMG695

## 2022-05-17 HISTORY — PX: IR ANGIOGRAM FOLLOW UP STUDY: IMG697

## 2022-05-17 HISTORY — PX: RADIOLOGY WITH ANESTHESIA: SHX6223

## 2022-05-17 HISTORY — PX: IR 3D INDEPENDENT WKST: IMG2385

## 2022-05-17 HISTORY — PX: IR NEURO EACH ADD'L AFTER BASIC UNI RIGHT (MS): IMG5374

## 2022-05-17 HISTORY — PX: IR ANGIO INTRA EXTRACRAN SEL INTERNAL CAROTID UNI R MOD SED: IMG5362

## 2022-05-17 LAB — BASIC METABOLIC PANEL
Anion gap: 11 (ref 5–15)
BUN: 14 mg/dL (ref 8–23)
CO2: 25 mmol/L (ref 22–32)
Calcium: 9.3 mg/dL (ref 8.9–10.3)
Chloride: 102 mmol/L (ref 98–111)
Creatinine, Ser: 0.82 mg/dL (ref 0.61–1.24)
GFR, Estimated: >60 mL/min (ref >60)
Glucose, Bld: 120 mg/dL — ABNORMAL HIGH (ref 70–99)
Potassium: 3.9 mmol/L (ref 3.5–5.1)
Sodium: 138 mmol/L (ref 135–145)

## 2022-05-17 LAB — CBC WITH DIFFERENTIAL/PLATELET
Abs Immature Granulocytes: 0.03 10*3/uL (ref 0.00–0.07)
Basophils Absolute: 0 10*3/uL (ref 0.0–0.1)
Basophils Relative: 1 %
Eosinophils Absolute: 0.2 10*3/uL (ref 0.0–0.5)
Eosinophils Relative: 3 %
HCT: 46 % (ref 39.0–52.0)
Hemoglobin: 15 g/dL (ref 13.0–17.0)
Immature Granulocytes: 0 %
Lymphocytes Relative: 19 %
Lymphs Abs: 1.3 10*3/uL (ref 0.7–4.0)
MCH: 30.8 pg (ref 26.0–34.0)
MCHC: 32.6 g/dL (ref 30.0–36.0)
MCV: 94.5 fL (ref 80.0–100.0)
Monocytes Absolute: 0.5 10*3/uL (ref 0.1–1.0)
Monocytes Relative: 8 %
Neutro Abs: 4.6 10*3/uL (ref 1.7–7.7)
Neutrophils Relative %: 69 %
Platelets: 144 10*3/uL — ABNORMAL LOW (ref 150–400)
RBC: 4.87 MIL/uL (ref 4.22–5.81)
RDW: 12.5 % (ref 11.5–15.5)
WBC: 6.7 10*3/uL (ref 4.0–10.5)
nRBC: 0 % (ref 0.0–0.2)

## 2022-05-17 LAB — URINALYSIS, ROUTINE W REFLEX MICROSCOPIC
Bacteria, UA: NONE SEEN
Bilirubin Urine: NEGATIVE
Glucose, UA: >=500 mg/dL — AB
Hgb urine dipstick: NEGATIVE
Ketones, ur: NEGATIVE mg/dL
Leukocytes,Ua: NEGATIVE
Nitrite: NEGATIVE
Protein, ur: NEGATIVE mg/dL
Specific Gravity, Urine: 1.01 (ref 1.005–1.030)
pH: 5 (ref 5.0–8.0)

## 2022-05-17 LAB — GLUCOSE, CAPILLARY
Glucose-Capillary: 121 mg/dL — ABNORMAL HIGH (ref 70–99)
Glucose-Capillary: 80 mg/dL (ref 70–99)
Glucose-Capillary: 116 mg/dL — ABNORMAL HIGH (ref 70–99)

## 2022-05-17 LAB — PROTIME-INR
INR: 1 (ref 0.8–1.2)
Prothrombin Time: 13.1 seconds (ref 11.4–15.2)

## 2022-05-17 LAB — APTT: aPTT: 27 seconds (ref 24–36)

## 2022-05-17 LAB — HEMOGLOBIN A1C
Hgb A1c MFr Bld: 9.7 % — ABNORMAL HIGH (ref 4.8–5.6)
Mean Plasma Glucose: 231.69 mg/dL

## 2022-05-17 LAB — MRSA NEXT GEN BY PCR, NASAL: MRSA by PCR Next Gen: NOT DETECTED

## 2022-05-17 SURGERY — IR WITH ANESTHESIA
Anesthesia: General

## 2022-05-17 MED ORDER — INSULIN ASPART 100 UNIT/ML IJ SOLN
0.0000 [IU] | Freq: Three times a day (TID) | INTRAMUSCULAR | Status: DC
  Administered 2022-05-18: 15 [IU] via SUBCUTANEOUS

## 2022-05-17 MED ORDER — LABETALOL HCL 5 MG/ML IV SOLN
5.0000 mg | INTRAVENOUS | Status: AC | PRN
  Administered 2022-05-17 (×3): 5 mg via INTRAVENOUS

## 2022-05-17 MED ORDER — LABETALOL HCL 5 MG/ML IV SOLN
INTRAVENOUS | Status: AC
  Filled 2022-05-17: qty 4

## 2022-05-17 MED ORDER — FENTANYL CITRATE (PF) 250 MCG/5ML IJ SOLN
INTRAMUSCULAR | Status: DC | PRN
  Administered 2022-05-17: 100 ug via INTRAVENOUS

## 2022-05-17 MED ORDER — GABAPENTIN 300 MG PO CAPS
300.0000 mg | ORAL_CAPSULE | Freq: Every day | ORAL | Status: DC
  Administered 2022-05-17: 300 mg via ORAL
  Filled 2022-05-17: qty 1

## 2022-05-17 MED ORDER — LABETALOL HCL 5 MG/ML IV SOLN
10.0000 mg | INTRAVENOUS | Status: DC | PRN
  Administered 2022-05-17: 20 mg via INTRAVENOUS
  Filled 2022-05-17: qty 4

## 2022-05-17 MED ORDER — MIRABEGRON ER 25 MG PO TB24
25.0000 mg | ORAL_TABLET | Freq: Every day | ORAL | Status: DC
  Filled 2022-05-17: qty 1

## 2022-05-17 MED ORDER — ROCURONIUM BROMIDE 10 MG/ML (PF) SYRINGE
PREFILLED_SYRINGE | INTRAVENOUS | Status: DC | PRN
  Administered 2022-05-17 (×2): 20 mg via INTRAVENOUS
  Administered 2022-05-17: 60 mg via INTRAVENOUS

## 2022-05-17 MED ORDER — LOSARTAN POTASSIUM 50 MG PO TABS: 50.0000 mg | ORAL_TABLET | Freq: Every day | ORAL | Status: DC

## 2022-05-17 MED ORDER — FENTANYL CITRATE (PF) 100 MCG/2ML IJ SOLN
25.0000 ug | INTRAMUSCULAR | Status: DC | PRN
  Administered 2022-05-17 (×3): 50 ug via INTRAVENOUS

## 2022-05-17 MED ORDER — OXYCODONE HCL 5 MG PO TABS
5.0000 mg | ORAL_TABLET | Freq: Four times a day (QID) | ORAL | Status: DC | PRN
  Administered 2022-05-18: 5 mg via ORAL
  Filled 2022-05-17: qty 1

## 2022-05-17 MED ORDER — IOHEXOL 300 MG/ML  SOLN
100.0000 mL | Freq: Once | INTRAMUSCULAR | Status: AC | PRN
  Administered 2022-05-17: 21 mL via INTRA_ARTERIAL

## 2022-05-17 MED ORDER — PANTOPRAZOLE SODIUM 40 MG IV SOLR
40.0000 mg | Freq: Every day | INTRAVENOUS | Status: DC
  Administered 2022-05-17: 40 mg via INTRAVENOUS
  Filled 2022-05-17: qty 10

## 2022-05-17 MED ORDER — INSULIN ASPART 100 UNIT/ML IJ SOLN: 0.0000 [IU] | INTRAMUSCULAR | Status: DC | PRN

## 2022-05-17 MED ORDER — OXYCODONE HCL 5 MG/5ML PO SOLN: 5.0000 mg | Freq: Once | ORAL | Status: DC | PRN

## 2022-05-17 MED ORDER — PROPOFOL 10 MG/ML IV BOLUS
INTRAVENOUS | Status: DC | PRN
  Administered 2022-05-17: 200 mg via INTRAVENOUS

## 2022-05-17 MED ORDER — ZOLPIDEM TARTRATE 5 MG PO TABS
5.0000 mg | ORAL_TABLET | Freq: Every day | ORAL | Status: DC
  Administered 2022-05-17: 5 mg via ORAL
  Filled 2022-05-17: qty 1

## 2022-05-17 MED ORDER — EMPAGLIFLOZIN 25 MG PO TABS
25.0000 mg | ORAL_TABLET | Freq: Every day | ORAL | Status: DC
  Administered 2022-05-17: 25 mg via ORAL
  Filled 2022-05-17 (×2): qty 1

## 2022-05-17 MED ORDER — ALBUTEROL SULFATE (2.5 MG/3ML) 0.083% IN NEBU: 2.5000 mg | INHALATION_SOLUTION | Freq: Four times a day (QID) | RESPIRATORY_TRACT | Status: DC | PRN

## 2022-05-17 MED ORDER — CLOPIDOGREL BISULFATE 75 MG PO TABS
75.0000 mg | ORAL_TABLET | Freq: Every day | ORAL | Status: DC
  Administered 2022-05-18: 75 mg via ORAL
  Filled 2022-05-17: qty 1

## 2022-05-17 MED ORDER — ONDANSETRON HCL 4 MG PO TABS: 4.0000 mg | ORAL_TABLET | ORAL | Status: DC | PRN

## 2022-05-17 MED ORDER — ISOSORBIDE MONONITRATE ER 30 MG PO TB24: 30.0000 mg | ORAL_TABLET | Freq: Every day | ORAL | Status: DC

## 2022-05-17 MED ORDER — ONDANSETRON HCL 4 MG/2ML IJ SOLN: 4.0000 mg | Freq: Four times a day (QID) | INTRAMUSCULAR | Status: DC | PRN

## 2022-05-17 MED ORDER — SODIUM CHLORIDE 0.9 % IV SOLN: INTRAVENOUS | Status: DC

## 2022-05-17 MED ORDER — ORAL CARE MOUTH RINSE: 15.0000 mL | Freq: Once | OROMUCOSAL | Status: AC

## 2022-05-17 MED ORDER — CHLORHEXIDINE GLUCONATE 0.12 % MT SOLN: 15.0000 mL | Freq: Once | OROMUCOSAL | Status: AC

## 2022-05-17 MED ORDER — ASPIRIN 81 MG PO TBEC: 81.0000 mg | DELAYED_RELEASE_TABLET | Freq: Every day | ORAL | Status: DC

## 2022-05-17 MED ORDER — CHLORHEXIDINE GLUCONATE CLOTH 2 % EX PADS
6.0000 | MEDICATED_PAD | Freq: Every day | CUTANEOUS | Status: DC
  Administered 2022-05-17: 6 via TOPICAL

## 2022-05-17 MED ORDER — PHENYLEPHRINE 80 MCG/ML (10ML) SYRINGE FOR IV PUSH (FOR BLOOD PRESSURE SUPPORT)
PREFILLED_SYRINGE | INTRAVENOUS | Status: DC | PRN
  Administered 2022-05-17: 120 ug via INTRAVENOUS
  Administered 2022-05-17 (×2): 80 ug via INTRAVENOUS

## 2022-05-17 MED ORDER — IOHEXOL 300 MG/ML  SOLN
150.0000 mL | Freq: Once | INTRAMUSCULAR | Status: AC | PRN
  Administered 2022-05-17: 110 mL via INTRA_ARTERIAL

## 2022-05-17 MED ORDER — CEFAZOLIN SODIUM-DEXTROSE 2-4 GM/100ML-% IV SOLN
2.0000 g | INTRAVENOUS | Status: AC
  Administered 2022-05-17: 2 g via INTRAVENOUS
  Filled 2022-05-17: qty 100

## 2022-05-17 MED ORDER — LACTATED RINGERS IV SOLN: INTRAVENOUS | Status: DC | PRN

## 2022-05-17 MED ORDER — METOPROLOL SUCCINATE ER 25 MG PO TB24: 25.0000 mg | ORAL_TABLET | Freq: Every day | ORAL | Status: DC

## 2022-05-17 MED ORDER — HEPARIN SODIUM (PORCINE) 1000 UNIT/ML IJ SOLN
INTRAMUSCULAR | Status: DC | PRN
  Administered 2022-05-17: 3000 [IU] via INTRAVENOUS
  Administered 2022-05-17: 2000 [IU] via INTRAVENOUS

## 2022-05-17 MED ORDER — INSULIN ASPART PROT & ASPART (70-30 MIX) 100 UNIT/ML ~~LOC~~ SUSP
45.0000 [IU] | Freq: Two times a day (BID) | SUBCUTANEOUS | Status: DC
  Filled 2022-05-17: qty 10

## 2022-05-17 MED ORDER — FENTANYL CITRATE (PF) 100 MCG/2ML IJ SOLN
INTRAMUSCULAR | Status: AC
  Filled 2022-05-17: qty 2

## 2022-05-17 MED ORDER — LOTEPREDNOL ETABONATE 0.25 % OP SUSP: 1.0000 [drp] | Freq: Every day | OPHTHALMIC | Status: DC

## 2022-05-17 MED ORDER — ONDANSETRON HCL 4 MG/2ML IJ SOLN: 4.0000 mg | INTRAMUSCULAR | Status: DC | PRN

## 2022-05-17 MED ORDER — ICOSAPENT ETHYL 1 G PO CAPS
2.0000 g | ORAL_CAPSULE | Freq: Two times a day (BID) | ORAL | Status: DC
  Administered 2022-05-17: 2 g via ORAL
  Filled 2022-05-17 (×2): qty 2

## 2022-05-17 MED ORDER — CYCLOBENZAPRINE HCL 10 MG PO TABS: 10.0000 mg | ORAL_TABLET | Freq: Three times a day (TID) | ORAL | Status: DC | PRN

## 2022-05-17 MED ORDER — OXYCODONE HCL 5 MG PO TABS: 5.0000 mg | ORAL_TABLET | Freq: Once | ORAL | Status: DC | PRN

## 2022-05-17 MED ORDER — CHLORHEXIDINE GLUCONATE CLOTH 2 % EX PADS: 6.0000 | MEDICATED_PAD | Freq: Once | CUTANEOUS | Status: DC

## 2022-05-17 MED ORDER — MIDAZOLAM HCL 2 MG/2ML IJ SOLN
INTRAMUSCULAR | Status: DC | PRN
  Administered 2022-05-17: 1 mg via INTRAVENOUS

## 2022-05-17 MED ORDER — OXYCODONE-ACETAMINOPHEN 5-325 MG PO TABS
1.0000 | ORAL_TABLET | Freq: Four times a day (QID) | ORAL | Status: DC | PRN
  Administered 2022-05-17 – 2022-05-18 (×2): 1 via ORAL
  Filled 2022-05-17 (×2): qty 1

## 2022-05-17 MED ORDER — OXYCODONE-ACETAMINOPHEN 10-325 MG PO TABS: 1.0000 | ORAL_TABLET | Freq: Four times a day (QID) | ORAL | Status: DC | PRN

## 2022-05-17 MED ORDER — HEPARIN SODIUM (PORCINE) 5000 UNIT/ML IJ SOLN
5000.0000 [IU] | Freq: Three times a day (TID) | INTRAMUSCULAR | Status: DC
  Administered 2022-05-17 – 2022-05-18 (×2): 5000 [IU] via SUBCUTANEOUS
  Filled 2022-05-17 (×3): qty 1

## 2022-05-17 MED ORDER — INSULIN GLARGINE-YFGN 100 UNIT/ML ~~LOC~~ SOLN
45.0000 [IU] | Freq: Two times a day (BID) | SUBCUTANEOUS | Status: DC
  Administered 2022-05-17: 45 [IU] via SUBCUTANEOUS
  Filled 2022-05-17 (×3): qty 0.45

## 2022-05-17 MED ORDER — LIDOCAINE 2% (20 MG/ML) 5 ML SYRINGE
INTRAMUSCULAR | Status: DC | PRN
  Administered 2022-05-17: 60 mg via INTRAVENOUS

## 2022-05-17 MED ORDER — ONDANSETRON HCL 4 MG/2ML IJ SOLN
INTRAMUSCULAR | Status: DC | PRN
  Administered 2022-05-17: 4 mg via INTRAVENOUS

## 2022-05-17 MED ORDER — PHENYLEPHRINE HCL-NACL 20-0.9 MG/250ML-% IV SOLN
INTRAVENOUS | Status: DC | PRN
  Administered 2022-05-17: 30 ug/min via INTRAVENOUS

## 2022-05-17 MED ORDER — SUGAMMADEX SODIUM 200 MG/2ML IV SOLN
INTRAVENOUS | Status: DC | PRN
  Administered 2022-05-17 (×3): 100 mg via INTRAVENOUS

## 2022-05-17 MED ORDER — ROSUVASTATIN CALCIUM 20 MG PO TABS: 40.0000 mg | ORAL_TABLET | Freq: Every day | ORAL | Status: DC

## 2022-05-17 MED ORDER — CHLORHEXIDINE GLUCONATE 0.12 % MT SOLN
OROMUCOSAL | Status: AC
  Administered 2022-05-17: 15 mL via OROMUCOSAL
  Filled 2022-05-17: qty 15

## 2022-05-17 NOTE — Transfer of Care (Signed)
Immediate Anesthesia Transfer of Care Note  Patient: [name]  Procedure(s) Performed: Diagnostic Cerebral Angiogram, Possible Aneurysm Coiling, Possible Stent  Patient Location: PACU  Anesthesia Type:General  Level of Consciousness: awake, alert  and oriented  Airway & Oxygen Therapy: Patient Spontanous Breathing and Patient connected to nasal cannula oxygen  Post-op Assessment: Report given to RN and Post -op Vital signs reviewed and stable  Post vital signs: Reviewed and stable  Last Vitals:  Vitals Value Taken Time  BP    Temp    Pulse    Resp    SpO2      Last Pain:  Vitals:   05/17/22 1330  PainSc: 0-No pain         Complications: No notable events documented.

## 2022-05-17 NOTE — Sedation Documentation (Addendum)
8 Fr angioseal failed to deploy right groin, holding manual pressure at this time

## 2022-05-17 NOTE — Sedation Documentation (Signed)
Dressing in place right groin. Quik-clot applied, discontinue in 24 hours on 10/13 @ 1800

## 2022-05-17 NOTE — Sedation Documentation (Signed)
Patient transported to PACU. RN at the bedside to receive handoff. Right groin site assessed. Clean, dry and intact. No drainage noted. Soft to palpation, no hematoma noted. Doppler distal pulses. Quik clot to be removed 05/18/2022 at 1800

## 2022-05-17 NOTE — Anesthesia Procedure Notes (Signed)
Procedure Name: Intubation Date/Time: 05/17/2022 3:30 PM  Performed by: Trinna Post., CRNAPre-anesthesia Checklist: Patient identified, Emergency Drugs available, Suction available, Patient being monitored and Timeout performed Patient Re-evaluated:Patient Re-evaluated prior to induction Oxygen Delivery Method: Circle system utilized Preoxygenation: Pre-oxygenation with 100% oxygen Induction Type: IV induction Ventilation: Mask ventilation without difficulty and Oral airway inserted - appropriate to patient size Laryngoscope Size: Mac and 4 Grade View: Grade I Tube type: Oral Tube size: 7.5 mm Number of attempts: 1 Airway Equipment and Method: Stylet Placement Confirmation: ETT inserted through vocal cords under direct vision, positive ETCO2 and breath sounds checked- equal and bilateral Secured at: 23 cm Tube secured with: Tape Dental Injury: Teeth and Oropharynx as per pre-operative assessment

## 2022-05-17 NOTE — Sedation Documentation (Addendum)
Unable to place foley catheter at this time

## 2022-05-17 NOTE — H&P (Signed)
See progress note dated 05/17/22 at 2:28p.

## 2022-05-17 NOTE — Brief Op Note (Signed)
  NEUROSURGERY BRIEF OPERATIVE  NOTE   PREOP DX: RMCA aneurysm  POSTOP DX: Same  PROCEDURE: Diagnostic cerebral angiogram, coil embolization of RMCA aneurysm  SURGEON: Dr. Consuella Lose, MD  ANESTHESIA: IV Sedation with Local  EBL: Minimal  SPECIMENS: None  COMPLICATIONS: None  CONDITION: Stable to recovery  FINDINGS (Full report in [facility]): 1. Successful coil embolization of distal right M1 aneurysm. There is sluggish flow through the ophthalmic artery on final angiogram although retinal blush appears to be preserved. 2. Significant aortoiliac atherosclerotic disease   Consuella Lose, MD [facility]

## 2022-05-17 NOTE — Progress Notes (Signed)
Chief Complaint  Aneurysm, headache  History of Present Illness  [name] is a 61 year old man I am seeing for cerebral aneurysm although he is an established patient of my partner, Dr. 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  Patient is referred for evaluation of middle cerebral artery aneurysm.  Briefly, the patient reports sudden onset of worst headache of his life around the 4th of September, a little more than a month ago.  He says he was sitting on his recliner when he had sudden onset of the headache.  Neither he nor his wife report any loss of consciousness or associated numbness tingling or weakness.  No visual changes.  Patient considered going to the emergency department but did not want to wait 4 hours and therefore did not go.  Unfortunately, his headache did not really improve over the course of several days and ultimately he was seen by his primary care doctor.  Patient states that he was initially given some steroids, antibiotics, and maybe Valtrex.  Without improvement in his headaches, ultimately CT angiogram was ordered revealing the presence of a middle cerebral artery aneurysm.  He was then referred for neurosurgical evaluation.  Of note, the patient has a significant medical history including diabetes, significant coronary artery disease status post 5x bypass surgery about 10 years ago.  Patient also reports COPD and currently smokes 1-1.5 packs per day.  Patient also has a history of significant peripheral vascular disease having previously undergone multiple endovascular angioplasty procedures.  Patient is currently maintained on daily 81 mg aspirin and 75 mg Plavix  Past Medical History   Past Medical History:  Diagnosis Date   Arthritis    "some; in hips sometimes" (01/02/2013)   Back pain    "w/prolonged walking or standing" (01/02/2013)   CAD (coronary artery disease)    CABG X 4 08/2012   COPD (chronic obstructive pulmonary disease) (HCC)     Elevated PSA    Esophageal erosions 05/24/2001   egd by Dr. Gala Romney   GERD (gastroesophageal reflux disease)    H/O hiatal hernia    Headache    History of gout    Hyperlipidemia    Hypertension    not on any medication for htn at present   PAD (peripheral artery disease) ([place])    LEA DOPPLER, 11/11/2012 - moderate arterial insufficiency to lower extremities at rest, BILATERAL SFA-demonstrates occlusive disease with reconstitution of flow   S/P percutaneous transluminal angioplasty (PTA) with stent placement 03/29/21 03/31/2021   Tobacco abuse    Tubular adenoma of colon 08/28/2011   Type II diabetes mellitus ([place])    Unintentional weight loss     Past Surgical History   Past Surgical History:  Procedure Laterality Date   ABDOMINAL AORTOGRAM W/LOWER EXTREMITY N/A 03/30/2021   Procedure: ABDOMINAL AORTOGRAM W/LOWER EXTREMITY;  Surgeon: Lorretta Harp, MD;  Location: [place] [facility];  Service: Cardiovascular;  Laterality: N/A;   ABDOMINAL AORTOGRAM W/LOWER EXTREMITY N/A 04/12/2022   Procedure: ABDOMINAL AORTOGRAM W/LOWER EXTREMITY;  Surgeon: Lorretta Harp, MD;  Location: [place] [facility];  Service: Cardiovascular;  Laterality: N/A;   ANTERIOR CERVICAL DECOMP/DISCECTOMY FUSION  12/19/2011   Procedure: ANTERIOR CERVICAL DECOMPRESSION/DISCECTOMY FUSION 2 LEVELS;  Surgeon: Floyce Stakes, MD;  [facility]: [facility];  Service: Neurosurgery;  Laterality: N/A;  Cervical four-five Cervical five-six  Anterior cervical decompression/diskectomy, fusion, plate   ATHERECTOMY N/A 01/02/2013   Procedure: ATHERECTOMY;  Surgeon: Lorretta Harp, MD;  Location:  [place] [facility];  Service: Cardiovascular;  Laterality: N/A;   BACK SURGERY     x3   CARDIAC CATHETERIZATION  08/19/2012   CABG warranted; Severe ostial LCx stenosis with mid to distal vessel occlusion   COLONOSCOPY W/ POLYPECTOMY  08/28/11   Rourk-tubular adenomas removed from ascending colon, suboptimal prep, diminutive rectal polyps    COLONOSCOPY WITH PROPOFOL N/A 06/13/2017   Procedure: COLONOSCOPY WITH PROPOFOL;  Surgeon: Daneil Dolin, MD;  [facility];  Service: Endoscopy;  Laterality: N/A;  9:15am   CORONARY ARTERY BYPASS GRAFT  08/22/2012   Procedure: CORONARY ARTERY BYPASS GRAFTING (CABG);  Surgeon: Grace Isaac, MD;  Location: [place] [facility];  Service: Open Heart Surgery;  Laterality: N/A;  CABG x four,  using left internal mammary artery and right leg greater saphenous vein harvested endoscopically   ESOPHAGOGASTRODUODENOSCOPY  05/24/01   by Dr. Gala Romney   ESOPHAGOGASTRODUODENOSCOPY  08/28/11   Rourk-erosive reflux esophagitis, gastric erosions   ESOPHAGOGASTRODUODENOSCOPY (EGD) WITH PROPOFOL N/A 06/13/2017   Procedure: ESOPHAGOGASTRODUODENOSCOPY (EGD) WITH PROPOFOL;  Surgeon: Daneil Dolin, MD;  [facility];  Service: Endoscopy;  Laterality: N/A;   INTRAOPERATIVE TRANSESOPHAGEAL ECHOCARDIOGRAM  08/22/2012   Procedure: INTRAOPERATIVE TRANSESOPHAGEAL ECHOCARDIOGRAM;  Surgeon: Grace Isaac, MD;  Location: [facility];  Service: Open Heart Surgery;  Laterality: N/A;   LEFT HEART CATHETERIZATION WITH CORONARY ANGIOGRAM N/A 08/19/2012   Procedure: LEFT HEART CATHETERIZATION WITH CORONARY ANGIOGRAM;  Surgeon: [name], MD;  Location: [facility];  Service: Cardiovascular;  Laterality: N/A;   LOWER EXTREMITY ANGIOGRAM N/A 11/17/2012   Procedure: LOWER EXTREMITY ANGIOGRAM;  Surgeon: Lorretta Harp, MD;  Location: [facility];  Service: Cardiovascular;  Laterality: N/A;   LOWER EXTREMITY ANGIOGRAM N/A 02/02/2013   Procedure: LOWER EXTREMITY ANGIOGRAM;  Surgeon: Lorretta Harp, MD;  Location: [facility];  Service: Cardiovascular;  Laterality: N/A;   LUMBAR DISC SURGERY     LUMBAR FUSION  ~ 2011   PELVIC ABCESS DRAINAGE     x2   PERIPHERAL VASCULAR ATHERECTOMY  03/30/2021   Procedure: PERIPHERAL VASCULAR ATHERECTOMY;  Surgeon: Lorretta Harp, MD;  Location: [place] [facility];  Service:  Cardiovascular;;  left common iliac artery   PERIPHERAL VASCULAR BALLOON ANGIOPLASTY Left 04/12/2022   Procedure: PERIPHERAL VASCULAR BALLOON ANGIOPLASTY;  Surgeon: Lorretta Harp, MD;  Location: [place] [facility];  Service: Cardiovascular;  Laterality: Left;  SFA   PERIPHERAL VASCULAR INTERVENTION  03/30/2021   Procedure: PERIPHERAL VASCULAR INTERVENTION;  Surgeon: Lorretta Harp, MD;  Location: [place] [facility];  Service: Cardiovascular;;   POLYPECTOMY  06/13/2017   Procedure: POLYPECTOMY;  Surgeon: Daneil Dolin, MD;  [facility];  Service: Endoscopy;;  colon   SHOULDER SURGERY Right    VASCULAR SURGERY Right 01/02/2013   diamondback orbital rotational  atherectomy, chocolate balloon and IDEV  Stent.    Social History   Social History   Tobacco Use   Smoking status: Every Day    Packs/day: 1.00    Years: 39.00    Total pack years: 39.00    Types: Cigarettes   Smokeless tobacco: Never  Vaping Use   Vaping Use: Never used  Substance Use Topics   Alcohol use: Yes    Alcohol/week: 6.0 standard drinks of alcohol    Types: 6 Cans of beer per week   Drug use: No    Medications   Prior to Admission medications   Medication Sig Start Date End  Date Taking? Authorizing Provider  acetaminophen (TYLENOL) 650 MG CR tablet Take 650 mg by mouth every 8 (eight) hours as needed for pain.   Yes [provider]  aspirin EC 81 MG EC tablet Take 1 tablet (81 mg total) by mouth daily. 09/11/18  Yes TatShanon Brow, MD  clopidogrel (PLAVIX) 75 MG tablet Take 1 tablet (75 mg total) by mouth daily with breakfast. 05/03/15  Yes Hilty, Nadean Corwin, MD  empagliflozin (JARDIANCE) 25 MG TABS tablet Take 25 mg by mouth daily.   Yes [provider]  gabapentin (NEURONTIN) 300 MG capsule Take 300 mg by mouth at bedtime. 08/19/18  Yes [provider]  HUMALOG MIX 75/25 KWIKPEN (75-25) 100 UNIT/ML Kwikpen Inject 45 Units into the skin in the morning and at bedtime. 09/07/18   Yes [provider]  isosorbide mononitrate (IMDUR) 30 MG 24 hr tablet Take 1 tablet (30 mg total) by mouth daily. 04/25/22  Yes Lorretta Harp, MD  LANTUS SOLOSTAR 100 UNIT/ML Solostar Pen Inject 45 Units into the skin 2 (two) times daily. 09/08/19  Yes [provider]  losartan (COZAAR) 50 MG tablet Take 50 mg by mouth daily. 04/27/22  Yes [provider]  metoprolol succinate (TOPROL-XL) 25 MG 24 hr tablet TAKE 1 TABLET BY MOUTH ONCE A DAY. 05/02/21  Yes Hilty, Nadean Corwin, MD  MYRBETRIQ 25 MG TB24 tablet Take 1 tablet (25 mg total) by mouth daily. 06/22/21  Yes Irine Seal, MD  oxyCODONE-acetaminophen (PERCOCET) 10-325 MG tablet Take 1 tablet by mouth 4 (four) times daily as needed for pain. 02/20/21  Yes [provider]  PROAIR HFA 108 (90 BASE) MCG/ACT inhaler Inhale 2 puffs into the lungs every 6 (six) hours as needed for wheezing or shortness of breath.  08/14/13  Yes [provider]  rosuvastatin (CRESTOR) 40 MG tablet Take 1 tablet (40 mg total) by mouth daily. 09/10/18  Yes Tat, Shanon Brow, MD  zolpidem (AMBIEN CR) 12.5 MG CR tablet Take 12.5 mg by mouth at bedtime. 06/15/20  Yes [provider]  Blood Glucose Monitoring Suppl ([place]) w/Device KIT  11/21/20   [provider]  carboxymethylcellulose (REFRESH PLUS) 0.5 % SOLN Place 1 drop into both eyes 3 (three) times daily as needed (dry eyes).    [provider]  cyclobenzaprine (FLEXERIL) 10 MG tablet Take 1 tablet (10 mg total) by mouth 3 (three) times daily as needed for muscle spasms. 10/31/17   Newman Pies, MD  docusate sodium (COLACE) 100 MG capsule Take 100 mg by mouth daily as needed for moderate constipation.    [provider]  icosapent Ethyl (VASCEPA) 1 g capsule TAKE (2) CAPSULES BY MOUTH TWICE DAILY. 05/16/22   Hilty, Nadean Corwin, MD  Lancets [facility] DELICA PLUS [id number]) MISC Apply topically daily. 02/07/21   [provider]   Lancets (ONETOUCH DELICA PLUS [id number]) MISC Apply topically 2 (two) times daily. 11/21/20   [provider]  Loteprednol Etabonate (EYSUVIS) 0.25 % SUSP Place 1 drop into both eyes at bedtime.    [provider]  [facility] VERIO test strip 2 (two) times daily. 11/21/20   [provider]    Allergies  No Known Allergies  Review of Systems  ROS  Neurologic Exam  Awake, alert, oriented Memory and concentration grossly intact Speech fluent, appropriate CN grossly intact Motor exam: Upper Extremities Deltoid Bicep Tricep Grip  Right 5/5 5/5 5/5 5/5  Left 5/5 5/5 5/5 5/5   Lower Extremities  IP Quad PF DF EHL  Right 5/5 5/5 5/5 5/5 5/5  Left 5/5 5/5 5/5 5/5 5/5   Sensation grossly intact to LT  Imaging  CT angiogram of the head dated 05/01/2022 was personally reviewed.  This demonstrates a small approximately 2-3 mm posteriorly projecting aneurysm from the distal right M1 segment.  Also seen is relatively diffuse calcific atherosclerotic disease primarily involving the cavernous segments of the internal carotid arteries bilaterally.  Impression  - 61 y.o. male with likely recent SAH vs sentinel hemorrhage and CTA revealing RMCA aneurysm. We are therefore treating him as a subacute SAH and will require treatment of his RMCA aneurysm.  Plan  - Will proceed with diagnostic angiogram and likely coil embolization of aneurysm.   I have reviewed the indications for the angiogram and coiling procedure as well as the details of the procedure and the expected postoperative course and recovery. We have also reviewed in detail the risks, benefits, and alternatives to the procedure at length in the office. All questions were answered and [name] provided informed consent to proceed.  Consuella Lose, MD [facility]

## 2022-05-18 LAB — GLUCOSE, CAPILLARY: Glucose-Capillary: 365 mg/dL — ABNORMAL HIGH (ref 70–99)

## 2022-05-18 NOTE — Progress Notes (Signed)
  NEUROSURGERY PROGRESS NOTE   Pt seen and examined. No issues overnight. No complaints this am. No new HA or visual changes.  EXAM: Temp:  [97.1 F (36.2 C)-98.7 F (37.1 C)] 98.7 F (37.1 C) (10/13 0759) Pulse Rate:  [53-90] 90 (10/13 0759) Resp:  [11-25] 16 (10/13 0759) BP: (97-169)/(51-98) 126/66 (10/13 0759) SpO2:  [87 %-100 %] 100 % (10/13 0759) Arterial Line BP: (94-195)/(44-75) 99/46 (10/13 0700) Weight:  [87.5 kg] 87.5 kg (10/12 1242) Intake/Output      10/12 0701 10/13 0700 10/13 0701 10/14 0700   I.V. (mL/kg) 1972.7 (22.5)    IV Piggyback 100    Total Intake(mL/kg) 2072.7 (23.7)    Urine (mL/kg/hr) 1500    Blood 50    Total Output 1550    Net +522.7         Urine Occurrence 1 x     Awake, alert, oriented Speech fluent CN intact Good strength throughout Right groin soft  LABS: Lab Results  Component Value Date   CREATININE 0.82 05/17/2022   BUN 14 05/17/2022   NA 138 05/17/2022   K 3.9 05/17/2022   CL 102 05/17/2022   CO2 25 05/17/2022   Lab Results  Component Value Date   WBC 6.7 05/17/2022   HGB 15.0 05/17/2022   HCT 46.0 05/17/2022   MCV 94.5 05/17/2022   PLT 144 (L) 05/17/2022    IMPRESSION: - 61 y.o. male POD#1 coil embolization of small RMCA aneurysm, doing well  PLAN: - d/c home today - Can cont daily ASA/Plavix   Consuella Lose, MD [facility]

## 2022-05-18 NOTE — Plan of Care (Signed)
Patient provided discharge instructions. Patient verbalized understanding, in no acute distress.

## 2022-05-18 NOTE — Discharge Summary (Signed)
Physician Discharge Summary  Patient ID: [name] MRN: [id number] DOB/AGE: [birth year] 61 y.o.  Admit date: 05/17/2022 Discharge date: 05/18/2022  Admission Diagnoses:  Aneurysm  Discharge Diagnoses:  Same Principal Problem:   Status post coil embolization of cerebral aneurysm Active Problems:   Cerebral aneurysm   Discharged Condition: Stable  Hospital Course:  [name] is a 61 y.o. male admitted after coil embolization of RMCA aneurysm. He was at baseline postop  and requested d/c home.  Treatments: Surgery - Coil embolization of RMCA aneurysm  Discharge Exam: Blood pressure 126/66, pulse 90, temperature 98.7 F (37.1 C), temperature source Oral, resp. rate 16, height _0  (1.803 m), weight 87.5 kg, SpO2 100 %. Awake, alert, oriented Speech fluent, appropriate CN grossly intact 5/5 BUE/BLE Wound c/d/i  Disposition: Discharge disposition: 01-Home or Self Care       Discharge Instructions     Call MD for:  redness, tenderness, or signs of infection (pain, swelling, redness, odor or green/yellow discharge around incision site)   Complete by: As directed    Call MD for:  temperature >100.4   Complete by: As directed    Diet - low sodium heart healthy   Complete by: As directed    Discharge instructions   Complete by: As directed    Walk at home as much as possible, at least 4 times / day   Increase activity slowly   Complete by: As directed    Lifting restrictions   Complete by: As directed    No lifting > 10 lbs   May shower / Bathe   Complete by: As directed    48 hours after surgery   May walk up steps   Complete by: As directed    No wound care   Complete by: As directed    Other Restrictions   Complete by: As directed    No bending/twisting at waist      Allergies as of 05/18/2022   No Known Allergies      Medication List     TAKE these medications    acetaminophen 650 MG CR tablet Commonly known as: TYLENOL Take 650 mg  by mouth every 8 (eight) hours as needed for pain.   aspirin EC 81 MG tablet Take 1 tablet (81 mg total) by mouth daily.   carboxymethylcellulose 0.5 % Soln Commonly known as: REFRESH PLUS Place 1 drop into both eyes 3 (three) times daily as needed (dry eyes).   clopidogrel 75 MG tablet Commonly known as: PLAVIX Take 1 tablet (75 mg total) by mouth daily with breakfast.   cyclobenzaprine 10 MG tablet Commonly known as: FLEXERIL Take 1 tablet (10 mg total) by mouth 3 (three) times daily as needed for muscle spasms.   docusate sodium 100 MG capsule Commonly known as: COLACE Take 100 mg by mouth daily as needed for moderate constipation.   empagliflozin 25 MG Tabs tablet Commonly known as: JARDIANCE Take 25 mg by mouth daily.   Eysuvis 0.25 % Susp Generic drug: Loteprednol Etabonate Place 1 drop into both eyes at bedtime.   gabapentin 300 MG capsule Commonly known as: NEURONTIN Take 300 mg by mouth at bedtime.   HumaLOG Mix 75/25 KwikPen (75-25) 100 UNIT/ML Kwikpen Generic drug: Insulin Lispro Prot & Lispro Inject 45 Units into the skin in the morning and at bedtime.   icosapent Ethyl 1 g capsule Commonly known as: VASCEPA TAKE (2) CAPSULES BY MOUTH TWICE DAILY.   isosorbide mononitrate 30 MG 24  hr tablet Commonly known as: IMDUR Take 1 tablet (30 mg total) by mouth daily.   Lantus SoloStar 100 UNIT/ML Solostar Pen Generic drug: insulin glargine Inject 45 Units into the skin 2 (two) times daily.   losartan 50 MG tablet Commonly known as: COZAAR Take 50 mg by mouth daily.   metoprolol succinate 25 MG 24 hr tablet Commonly known as: TOPROL-XL TAKE 1 TABLET BY MOUTH ONCE A DAY.   Myrbetriq 25 MG Tb24 tablet Generic drug: mirabegron ER Take 1 tablet (25 mg total) by mouth daily.   OneTouch Delica Plus [id number] Misc Apply topically 2 (two) times daily.   OneTouch Delica Plus [id number] Misc Apply topically daily.   OneTouch Verio Flex System w/Device Kit    OneTouch Verio test strip Generic drug: glucose blood 2 (two) times daily.   oxyCODONE-acetaminophen 10-325 MG tablet Commonly known as: PERCOCET Take 1 tablet by mouth 4 (four) times daily as needed for pain.   ProAir HFA 108 (90 Base) MCG/ACT inhaler Generic drug: albuterol Inhale 2 puffs into the lungs every 6 (six) hours as needed for wheezing or shortness of breath.   rosuvastatin 40 MG tablet Commonly known as: CRESTOR Take 1 tablet (40 mg total) by mouth daily.   zolpidem 12.5 MG CR tablet Commonly known as: AMBIEN CR Take 12.5 mg by mouth at bedtime.        Follow-up Information     Consuella Lose, MD Follow up in 3 week(s).   Specialty: Neurosurgery Contact information: [address] [place] [address] [phone]                 Signed: Jairo Ben 05/18/2022, 8:28 AM

## 2022-05-18 NOTE — Anesthesia Postprocedure Evaluation (Signed)
Anesthesia Post Note  Patient: [name]  Procedure(s) Performed: Diagnostic Cerebral Angiogram, Possible Aneurysm Coiling, Possible Stent     Patient location during evaluation: PACU Anesthesia Type: General Level of consciousness: awake and alert Pain management: pain level controlled Vital Signs Assessment: post-procedure vital signs reviewed and stable Respiratory status: spontaneous breathing, nonlabored ventilation, respiratory function stable and patient connected to nasal cannula oxygen Cardiovascular status: blood pressure returned to baseline and stable Postop Assessment: no apparent nausea or vomiting Anesthetic complications: no   No notable events documented.  Last Vitals:  Vitals:   05/18/22 0800 05/18/22 0900  BP: 130/66 (!) 130/59  Pulse: 67 62  Resp: 14 15  Temp: 36.8 C   SpO2: 93% 94%    Last Pain:  Vitals:   05/18/22 0800  TempSrc: Axillary  PainSc:                  [place]

## 2022-05-21 ENCOUNTER — Encounter (HOSPITAL_COMMUNITY): Payer: Self-pay

## 2022-05-21 ENCOUNTER — Other Ambulatory Visit (HOSPITAL_COMMUNITY): Payer: Self-pay | Admitting: Neurosurgery

## 2022-05-21 DIAGNOSIS — I6010 Nontraumatic subarachnoid hemorrhage from unspecified middle cerebral artery: Secondary | ICD-10-CM

## 2022-05-30 DIAGNOSIS — I6010 Nontraumatic subarachnoid hemorrhage from unspecified middle cerebral artery: Secondary | ICD-10-CM | POA: Diagnosis not present

## 2022-06-06 ENCOUNTER — Encounter: Payer: Self-pay | Admitting: *Deleted

## 2022-06-08 ENCOUNTER — Ambulatory Visit (HOSPITAL_COMMUNITY)
Admission: RE | Admit: 2022-06-08 | Discharge: 2022-06-08 | Disposition: A | Discharge status: Home / Self Care | Payer: Medicare Other | Source: Ambulatory Visit | Attending: Internal Medicine | Admitting: Internal Medicine

## 2022-06-08 DIAGNOSIS — J432 Centrilobular emphysema: Secondary | ICD-10-CM | POA: Diagnosis not present

## 2022-06-08 DIAGNOSIS — R918 Other nonspecific abnormal finding of lung field: Secondary | ICD-10-CM | POA: Diagnosis not present

## 2022-06-13 ENCOUNTER — Telehealth: Payer: Self-pay | Admitting: Internal Medicine

## 2022-06-13 NOTE — Telephone Encounter (Signed)
Patient calling for his CT results

## 2022-06-13 NOTE — Telephone Encounter (Signed)
The patient has been notified of the result and verbalized understanding.  All questions (if any) were answered. Raiford Simmonds, RN 06/13/2022 10:40 AM

## 2022-06-13 NOTE — Telephone Encounter (Signed)
Stable pulmonary nodules - recommend follow-up CT in 2 years to ensure stability given radiology recommendations.   Dr. Lemmie Evens

## 2022-06-21 ENCOUNTER — Encounter: Payer: Self-pay | Admitting: Urology

## 2022-06-21 ENCOUNTER — Ambulatory Visit (INDEPENDENT_AMBULATORY_CARE_PROVIDER_SITE_OTHER): Payer: Medicare Other | Admitting: Urology

## 2022-06-21 VITALS — BP 174/81 | HR 79

## 2022-06-21 DIAGNOSIS — N401 Enlarged prostate with lower urinary tract symptoms: Secondary | ICD-10-CM

## 2022-06-21 DIAGNOSIS — R351 Nocturia: Secondary | ICD-10-CM

## 2022-06-21 DIAGNOSIS — R8271 Bacteriuria: Secondary | ICD-10-CM | POA: Diagnosis not present

## 2022-06-21 DIAGNOSIS — R8281 Pyuria: Secondary | ICD-10-CM

## 2022-06-21 DIAGNOSIS — N400 Enlarged prostate without lower urinary tract symptoms: Secondary | ICD-10-CM

## 2022-06-21 LAB — URINALYSIS, ROUTINE W REFLEX MICROSCOPIC
Bilirubin, UA: NEGATIVE
Ketones, UA: NEGATIVE
Nitrite, UA: NEGATIVE
Specific Gravity, UA: 1.015 (ref 1.005–1.030)
Urobilinogen, Ur: 0.2 mg/dL (ref 0.2–1.0)
pH, UA: 5 (ref 5.0–7.5)

## 2022-06-21 LAB — MICROSCOPIC EXAMINATION: WBC, UA: >30 /hpf — AB (ref 0–5)

## 2022-06-21 NOTE — Progress Notes (Signed)
Subjective: 1. Benign prostatic hyperplasia without lower urinary tract symptoms   2. Nocturia   3. Bacteriuria with pyuria     [name] is a 61 yo male who is sent in consultation by Dr. Cindie Laroche for an elevated PSA of 12.6 on 09/03/19.  Prior levels are not available. He is voiding well with an IPSS of 1.  He has nocturia x 1.  He has been on Myrbetriq 57m for an episode of enuresis 869mogo.   He has mild post void dribbling.  He has a history of CAD and has ED.   He has no history of UTI's or GU surgery.   He had no other new complaints at the time of the blood draw.   06/24/20: [name] today in f/u for his history of an elevated PSA with a negative biopsy on 12/25/19 by Dr. McAlyson Ingles He had a prostate volume of 2036m His PSA on 06/17/20 was 0.3.  His PSA on 12/16/19 was 0.1.    He is voiding well with an IPSS of 1.   He had COVID on 05/03/20 and was treated with the infusion and did well.  He remains on myrbetriq for his history of enuresis.  He is on chronic oxycodone and is on 5-10mg daily.    06/22/21: [name] today in f/u.   He is doing well on Myrbetriq 48m80mily with no enuresis.  He has had no hematuria or dysuria.  He hasn't had a recent PSA.  He remains on Oxycodone.     06/21/22: [name] today in f/u.  He reports he is no longer taking the Myrbetriq.  His IPSS is 2 with only nocturia. He has had no hematuria or dysuria.  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 He has not had a PSA prior to this visit.   UA has 3+ glucose, 30 WBC and bacteria.    IPSS     Row Name 06/21/22 0900         International Prostate Symptom Score   How often have you had the sensation of not emptying your bladder? Not at All     How often have you had to urinate less than every two hours? Not at All     How often have you found you stopped and started again several times when you urinated? Not at All      How often have you found it difficult to postpone urination? Not at All     How often have you had a weak urinary stream? Not at All     How often have you had to strain to start urination? Not at All     How many times did you typically get up at night to urinate? 2 Times     Total IPSS Score 2       Quality of Life due to urinary symptoms   If you were to spend the rest of your life with your urinary condition just the way it is now how would you feel about that? Pleased               ROS:  Review of Systems  All other systems reviewed and are negative.   No Known Allergies  Past Medical History:  Diagnosis Date   Arthritis    "some; in hips sometimes" (01/02/2013)   Back pain    "w/prolonged  walking or standing" (01/02/2013)   CAD (coronary artery disease)    CABG X 4 08/2012   COPD (chronic obstructive pulmonary disease) (HCC)    Elevated PSA    Esophageal erosions 05/24/2001   egd by Dr. Gala Romney   GERD (gastroesophageal reflux disease)    H/O hiatal hernia    Headache    History of gout    Hyperlipidemia    Hypertension    not on any medication for htn at present   PAD (peripheral artery disease) ([place])    LEA DOPPLER, 11/11/2012 - moderate arterial insufficiency to lower extremities at rest, BILATERAL SFA-demonstrates occlusive disease with reconstitution of flow   S/P percutaneous transluminal angioplasty (PTA) with stent placement 03/29/21 03/31/2021   Tobacco abuse    Tubular adenoma of colon 08/28/2011   Type II diabetes mellitus ([place])    Unintentional weight loss     Past Surgical History:  Procedure Laterality Date   ABDOMINAL AORTOGRAM W/LOWER EXTREMITY N/A 03/30/2021   Procedure: ABDOMINAL AORTOGRAM W/LOWER EXTREMITY;  Surgeon: Lorretta Harp, MD;  Location: [place];  Service: Cardiovascular;  Laterality: N/A;   ABDOMINAL AORTOGRAM W/LOWER EXTREMITY N/A 04/12/2022   Procedure: ABDOMINAL AORTOGRAM W/LOWER EXTREMITY;  Surgeon: Lorretta Harp, MD;  Location: [facility] [facility];  Service: Cardiovascular;  Laterality: N/A;   ANTERIOR CERVICAL DECOMP/DISCECTOMY FUSION  12/19/2011   Procedure: ANTERIOR CERVICAL DECOMPRESSION/DISCECTOMY FUSION 2 LEVELS;  Surgeon: Floyce Stakes, MD;  [facility];  Service: Neurosurgery;  Laterality: N/A;  Cervical four-five Cervical five-six  Anterior cervical decompression/diskectomy, fusion, plate   ATHERECTOMY N/A 01/02/2013   Procedure: ATHERECTOMY;  Surgeon: Lorretta Harp, MD;  Location: [facility];  Service: Cardiovascular;  Laterality: N/A;   BACK SURGERY     x3   CARDIAC CATHETERIZATION  08/19/2012   CABG warranted; Severe ostial LCx stenosis with mid to distal vessel occlusion   COLONOSCOPY W/ POLYPECTOMY  08/28/11   Rourk-tubular adenomas removed from ascending colon, suboptimal prep, diminutive rectal polyps   COLONOSCOPY WITH PROPOFOL N/A 06/13/2017   Procedure: COLONOSCOPY WITH PROPOFOL;  Surgeon: Daneil Dolin, MD;  [facility];  Service: Endoscopy;  Laterality: N/A;  9:15am   CORONARY ARTERY BYPASS GRAFT  08/22/2012   Procedure: CORONARY ARTERY BYPASS GRAFTING (CABG);  Surgeon: Grace Isaac, MD;  Location: [place];  Service: Open Heart Surgery;  Laterality: N/A;  CABG x four,  using left internal mammary artery and right leg greater saphenous vein harvested endoscopically   ESOPHAGOGASTRODUODENOSCOPY  05/24/01   by Dr. Gala Romney   ESOPHAGOGASTRODUODENOSCOPY  08/28/11   Rourk-erosive reflux esophagitis, gastric erosions   ESOPHAGOGASTRODUODENOSCOPY (EGD) WITH PROPOFOL N/A 06/13/2017   Procedure: ESOPHAGOGASTRODUODENOSCOPY (EGD) WITH PROPOFOL;  Surgeon: Daneil Dolin, MD;  [facility];  Service: Endoscopy;  Laterality: N/A;   INTRAOPERATIVE TRANSESOPHAGEAL ECHOCARDIOGRAM  08/22/2012   Procedure: INTRAOPERATIVE TRANSESOPHAGEAL ECHOCARDIOGRAM;  Surgeon: Grace Isaac, MD;  Location: [place];  Service: Open Heart Surgery;  Laterality: N/A;   IR 3D  INDEPENDENT WKST  05/17/2022   IR ANGIO INTRA EXTRACRAN SEL INTERNAL CAROTID UNI R MOD SED  05/17/2022   IR ANGIOGRAM FOLLOW UP STUDY  05/17/2022   IR NEURO EACH ADD'L AFTER BASIC UNI RIGHT (MS)  05/17/2022   IR TRANSCATH/EMBOLIZ  05/17/2022   IR US GUIDE VASC ACCESS RIGHT  05/17/2022   LEFT HEART CATHETERIZATION WITH CORONARY ANGIOGRAM N/A 08/19/2012   Procedure: LEFT HEART CATHETERIZATION WITH CORONARY ANGIOGRAM;  Surgeon:  [name], MD;  Location: [facility];  Service: Cardiovascular;  Laterality: N/A;   LOWER EXTREMITY ANGIOGRAM N/A 11/17/2012   Procedure: LOWER EXTREMITY ANGIOGRAM;  Surgeon: Lorretta Harp, MD;  Location: [facility] C[facility];  Service: Cardiovascular;  Laterality: N/A;   LOWER EXTREMITY ANGIOGRAM N/A 02/02/2013   Procedure: LOWER EXTREMITY ANGIOGRAM;  Surgeon: Lorretta Harp, MD;  Location: [facility];  Service: Cardiovascular;  Laterality: N/A;   LUMBAR DISC SURGERY     LUMBAR FUSION  ~ 2011   PELVIC ABCESS DRAINAGE     x2   PERIPHERAL VASCULAR ATHERECTOMY  03/30/2021   Procedure: PERIPHERAL VASCULAR ATHERECTOMY;  Surgeon: Lorretta Harp, MD;  Location: [place] [facility];  Service: Cardiovascular;;  left common iliac artery   PERIPHERAL VASCULAR BALLOON ANGIOPLASTY Left 04/12/2022   Procedure: PERIPHERAL VASCULAR BALLOON ANGIOPLASTY;  Surgeon: Lorretta Harp, MD;  Location: [place] [facility];  Service: Cardiovascular;  Laterality: Left;  SFA   PERIPHERAL VASCULAR INTERVENTION  03/30/2021   Procedure: PERIPHERAL VASCULAR INTERVENTION;  Surgeon: Lorretta Harp, MD;  Location: [place] [facility];  Service: Cardiovascular;;   POLYPECTOMY  06/13/2017   Procedure: POLYPECTOMY;  Surgeon: Daneil Dolin, MD;  [facility];  Service: Endoscopy;;  colon   RADIOLOGY WITH ANESTHESIA N/A 05/17/2022   Procedure: Diagnostic Cerebral Angiogram, Possible Aneurysm Coiling, Possible Stent;  Surgeon: Consuella Lose, MD;  Location: [place];  Service: Radiology;   Laterality: N/A;   SHOULDER SURGERY Right    VASCULAR SURGERY Right 01/02/2013   diamondback orbital rotational  atherectomy, chocolate balloon and IDEV  Stent.    Social History   Socioeconomic History   Marital status: Single    Spouse name: Not on file   Number of children: 1   Years of education: Not on file   Highest education level: Not on file  Occupational History   Occupation: disabled    Employer: DISABLED  Tobacco Use   Smoking status: Every Day    Packs/day: 1.00    Years: 39.00    Total pack years: 39.00    Types: Cigarettes   Smokeless tobacco: Never  Vaping Use   Vaping Use: Never used  Substance and Sexual Activity   Alcohol use: Yes    Alcohol/week: 6.0 standard drinks of alcohol    Types: 6 Cans of beer per week   Drug use: No   Sexual activity: Not Currently  Other Topics Concern   Not on file  Social History Narrative   Lives w/ girlfriend   Social Determinants of Health   Financial Resource Strain: Not on file  Food Insecurity: Not on file  Transportation Needs: Not on file  Physical Activity: Not on file  Stress: Not on file  Social Connections: Not on file  Intimate Partner Violence: Not on file    Family History  Problem Relation Age of Onset   Anesthesia problems Neg Hx    Colon cancer Neg Hx    Gastric cancer Neg Hx    Esophageal cancer Neg Hx     Anti-infectives: Anti-infectives (From admission, onward)    None       Current Outpatient Medications  Medication Sig Dispense Refill   acetaminophen (TYLENOL) 650 MG CR tablet Take 650 mg by mouth every 8 (eight) hours as needed for pain.     aspirin EC 81 MG EC tablet Take 1 tablet (81 mg total) by mouth daily.     Blood Glucose Monitoring  Suppl (ONETOUCH VERIO FLEX SYSTEM) w/Device KIT      carboxymethylcellulose (REFRESH PLUS) 0.5 % SOLN Place 1 drop into both eyes 3 (three) times daily as needed (dry eyes).     clopidogrel (PLAVIX) 75 MG tablet Take 1 tablet (75 mg total)  by mouth daily with breakfast. 30 tablet 11   cyclobenzaprine (FLEXERIL) 10 MG tablet Take 1 tablet (10 mg total) by mouth 3 (three) times daily as needed for muscle spasms. 50 tablet 1   empagliflozin (JARDIANCE) 25 MG TABS tablet Take 25 mg by mouth daily.     gabapentin (NEURONTIN) 300 MG capsule Take 300 mg by mouth at bedtime.     HUMALOG MIX 75/25 KWIKPEN (75-25) 100 UNIT/ML Kwikpen Inject 45 Units into the skin in the morning and at bedtime.     isosorbide mononitrate (IMDUR) 30 MG 24 hr tablet Take 1 tablet (30 mg total) by mouth daily. 90 tablet 3   Lancets (ONETOUCH DELICA PLUS [id number]) MISC Apply topically daily.     Lancets (ONETOUCH DELICA PLUS [id number]) MISC Apply topically 2 (two) times daily.     LANTUS SOLOSTAR 100 UNIT/ML Solostar Pen Inject 45 Units into the skin 2 (two) times daily.     losartan (COZAAR) 50 MG tablet Take 50 mg by mouth daily.     Loteprednol Etabonate (EYSUVIS) 0.25 % SUSP Place 1 drop into both eyes at bedtime.     metoprolol succinate (TOPROL-XL) 25 MG 24 hr tablet TAKE 1 TABLET BY MOUTH ONCE A DAY. (Patient taking differently: Take 25 mg by mouth daily.) 90 tablet 3   ONETOUCH VERIO test strip 2 (two) times daily.     oxyCODONE-acetaminophen (PERCOCET) 10-325 MG tablet Take 1 tablet by mouth 4 (four) times daily as needed for pain.     PROAIR HFA 108 (90 BASE) MCG/ACT inhaler Inhale 2 puffs into the lungs every 6 (six) hours as needed for wheezing or shortness of breath.      rosuvastatin (CRESTOR) 40 MG tablet Take 1 tablet (40 mg total) by mouth daily. 30 tablet 1   zolpidem (AMBIEN CR) 12.5 MG CR tablet Take 12.5 mg by mouth at bedtime.     No current facility-administered medications for this visit.     Objective: Vital signs in last 24 hours: BP (!) 174/81   Pulse 79   Intake/Output from previous day: No intake/output data recorded. Intake/Output this shift: _0 @   Physical Exam Genitourinary:    Comments: AP/NST without  lesions. Prostate 1.5+ benign. SV's non-palpable.     Lab Results:  Recent Results (from the past 2160 hour(s))  POCT Activated clotting time     Status: None   Collection Time: 04/12/22  8:30 AM  Result Value Ref Range   Activated Clotting Time 317 seconds    Comment: Reference range 74-137 seconds for patients not on anticoagulant therapy.  POCT Activated clotting time     Status: None   Collection Time: 04/12/22  8:55 AM  Result Value Ref Range   Activated Clotting Time 293 seconds    Comment: Reference range 74-137 seconds for patients not on anticoagulant therapy.  POCT Activated clotting time     Status: None   Collection Time: 04/12/22 11:24 AM  Result Value Ref Range   Activated Clotting Time 149 seconds    Comment: Reference range 74-137 seconds for patients not on anticoagulant therapy.  Glucose, capillary     Status: Abnormal   Collection Time: 04/12/22 11:27 AM  Result Value  Ref Range   Glucose-Capillary 175 (H) 70 - 99 mg/dL    Comment: Glucose reference range applies only to samples taken after fasting for at least 8 hours.  Basic metabolic panel     Status: Abnormal   Collection Time: 04/13/22  6:45 AM  Result Value Ref Range   Sodium 138 135 - 145 mmol/L   Potassium 4.4 3.5 - 5.1 mmol/L   Chloride 101 98 - 111 mmol/L   CO2 23 22 - 32 mmol/L   Glucose, Bld 191 (H) 70 - 99 mg/dL    Comment: Glucose reference range applies only to samples taken after fasting for at least 8 hours.   BUN 11 8 - 23 mg/dL   Creatinine, Ser 0.87 0.61 - 1.24 mg/dL   Calcium 9.1 8.9 - 10.3 mg/dL   GFR, Estimated >60 >60 mL/min    Comment: (NOTE) Calculated using the CKD-EPI Creatinine Equation (2021)    Anion gap 14 5 - 15    Comment: Performed at [facility] [address], [place], [place] [address]  CBC     Status: Abnormal   Collection Time: 04/13/22  6:45 AM  Result Value Ref Range   WBC 7.8 4.0 - 10.5 K/uL   RBC 4.84 4.22 - 5.81 MIL/uL   Hemoglobin 15.1 13.0 -  17.0 g/dL   HCT 45.7 39.0 - 52.0 %   MCV 94.4 80.0 - 100.0 fL   MCH 31.2 26.0 - 34.0 pg   MCHC 33.0 30.0 - 36.0 g/dL   RDW 12.6 11.5 - 15.5 %   Platelets 129 (L) 150 - 400 K/uL    Comment: REPEATED TO VERIFY   nRBC 0.0 0.0 - 0.2 %    Comment: Performed at [facility], [place] [address], [place], [place] [address]  Lipid panel     Status: Abnormal   Collection Time: 04/13/22  6:45 AM  Result Value Ref Range   Cholesterol 110 0 - 200 mg/dL   Triglycerides 173 (H) <150 mg/dL   HDL 29 (L) >40 mg/dL   Total CHOL/HDL Ratio 3.8 RATIO   VLDL 35 0 - 40 mg/dL   LDL Cholesterol 46 0 - 99 mg/dL    Comment:        Total Cholesterol/HDL:CHD Risk Coronary Heart Disease Risk Table                     Men   Women  1/2 Average Risk   3.4   3.3  Average Risk       5.0   4.4  2 X Average Risk   9.6   7.1  3 X Average Risk  23.4   11.0        Use the calculated Patient Ratio above and the CHD Risk Table to determine the patient's CHD Risk.        ATP III CLASSIFICATION (LDL):  <100     mg/dL   Optimal  100-129  mg/dL   Near or Above                    Optimal  130-159  mg/dL   Borderline  160-189  mg/dL   High  >190     mg/dL   Very High Performed at [facility] [address], [place], [place] [address]   Hemoglobin A1c     Status: Abnormal   Collection Time: 05/17/22 12:43 PM  Result Value Ref Range  Hgb A1c MFr Bld 9.7 (H) 4.8 - 5.6 %    Comment: (NOTE) Pre diabetes:          5.7%-6.4%  Diabetes:              >6.4%  Glycemic control for   <7.0% adults with diabetes    Mean Plasma Glucose 231.69 mg/dL    Comment: Performed at [facility], [place] [address] [place], [place] [address]  Urinalysis, Routine w reflex microscopic     Status: Abnormal   Collection Time: 05/17/22 12:44 PM  Result Value Ref Range   Color, Urine STRAW (A) YELLOW   APPearance CLEAR CLEAR   Specific Gravity, Urine 1.010 1.005 - 1.030   pH 5.0 5.0 - 8.0   Glucose, UA >=500 (A) NEGATIVE  mg/dL   Hgb urine dipstick NEGATIVE NEGATIVE   Bilirubin Urine NEGATIVE NEGATIVE   Ketones, ur NEGATIVE NEGATIVE mg/dL   Protein, ur NEGATIVE NEGATIVE mg/dL   Nitrite NEGATIVE NEGATIVE   Leukocytes,Ua NEGATIVE NEGATIVE   WBC, UA 0-5 0 - 5 WBC/hpf   Bacteria, UA NONE SEEN NONE SEEN   Squamous Epithelial / LPF 0-5 0 - 5   Mucus PRESENT     Comment: Performed at [facility], [place] [address], [place], [place] [address]  Glucose, capillary     Status: Abnormal   Collection Time: 05/17/22 12:44 PM  Result Value Ref Range   Glucose-Capillary 121 (H) 70 - 99 mg/dL    Comment: Glucose reference range applies only to samples taken after fasting for at least 8 hours.  APTT     Status: None   Collection Time: 05/17/22  1:26 PM  Result Value Ref Range   aPTT 27 24 - 36 seconds    Comment: Performed at [facility], [place] [address], [place], [place] [address]  Basic metabolic panel     Status: Abnormal   Collection Time: 05/17/22  1:26 PM  Result Value Ref Range   Sodium 138 135 - 145 mmol/L   Potassium 3.9 3.5 - 5.1 mmol/L   Chloride 102 98 - 111 mmol/L   CO2 25 22 - 32 mmol/L   Glucose, Bld 120 (H) 70 - 99 mg/dL    Comment: Glucose reference range applies only to samples taken after fasting for at least 8 hours.   BUN 14 8 - 23 mg/dL   Creatinine, Ser 0.82 0.61 - 1.24 mg/dL   Calcium 9.3 8.9 - 10.3 mg/dL   GFR, Estimated >60 >60 mL/min    Comment: (NOTE) Calculated using the CKD-EPI Creatinine Equation (2021)    Anion gap 11 5 - 15    Comment: Performed at [facility] [address] [place], [place] [address]  CBC with Differential/Platelet     Status: Abnormal   Collection Time: 05/17/22  1:26 PM  Result Value Ref Range   WBC 6.7 4.0 - 10.5 K/uL   RBC 4.87 4.22 - 5.81 MIL/uL   Hemoglobin 15.0 13.0 - 17.0 g/dL   HCT 46.0 39.0 - 52.0 %   MCV 94.5 80.0 - 100.0 fL   MCH 30.8 26.0 - 34.0 pg   MCHC 32.6 30.0 - 36.0 g/dL   RDW 12.5 11.5 - 15.5 %   Platelets 144  (L) 150 - 400 K/uL    Comment: REPEATED TO VERIFY   nRBC 0.0 0.0 - 0.2 %   Neutrophils Relative % 69 %   Neutro Abs 4.6 1.7 -  7.7 K/uL   Lymphocytes Relative 19 %   Lymphs Abs 1.3 0.7 - 4.0 K/uL   Monocytes Relative 8 %   Monocytes Absolute 0.5 0.1 - 1.0 K/uL   Eosinophils Relative 3 %   Eosinophils Absolute 0.2 0.0 - 0.5 K/uL   Basophils Relative 1 %   Basophils Absolute 0.0 0.0 - 0.1 K/uL   Immature Granulocytes 0 %   Abs Immature Granulocytes 0.03 0.00 - 0.07 K/uL    Comment: Performed at [facility] [address] [place], [place] [address]  Protime-INR     Status: None   Collection Time: 05/17/22  1:26 PM  Result Value Ref Range   Prothrombin Time 13.1 11.4 - 15.2 seconds   INR 1.0 0.8 - 1.2    Comment: (NOTE) INR goal varies based on device and disease states. Performed at [facility], [place] [address], [place], [place] [address]   Glucose, capillary     Status: None   Collection Time: 05/17/22  6:29 PM  Result Value Ref Range   Glucose-Capillary 80 70 - 99 mg/dL    Comment: Glucose reference range applies only to samples taken after fasting for at least 8 hours.   Comment 1 Notify RN    Comment 2 Document in Chart   MRSA Next Gen by PCR, Nasal     Status: None   Collection Time: 05/17/22  8:20 PM   Specimen: Nasal Mucosa; Nasal Swab  Result Value Ref Range   MRSA by PCR Next Gen NOT DETECTED NOT DETECTED    Comment: (NOTE) The GeneXpert MRSA Assay (FDA approved for NASAL specimens only), is one component of a comprehensive MRSA colonization surveillance program. It is not intended to diagnose MRSA infection nor to guide or monitor treatment for MRSA infections. Test performance is not FDA approved in patients less than 13 years old. Performed at [facility], [place] [address] [place], [place] [address]   Glucose, capillary     Status: Abnormal   Collection Time: 05/17/22 10:03 PM  Result Value Ref Range   Glucose-Capillary 116 (H) 70 - 99  mg/dL    Comment: Glucose reference range applies only to samples taken after fasting for at least 8 hours.  Glucose, capillary     Status: Abnormal   Collection Time: 05/18/22  7:39 AM  Result Value Ref Range   Glucose-Capillary 365 (H) 70 - 99 mg/dL    Comment: Glucose reference range applies only to samples taken after fasting for at least 8 hours.  Urinalysis, Routine w reflex microscopic     Status: Abnormal   Collection Time: 06/21/22  9:45 AM  Result Value Ref Range   Specific Gravity, UA 1.015 1.005 - 1.030   pH, UA 5.0 5.0 - 7.5   Color, UA Yellow Yellow   Appearance Ur Cloudy (A) Clear   Leukocytes,UA Trace (A) Negative   Protein,UA 1+ (A) Negative/Trace   Glucose, UA 3+ (A) Negative   Ketones, UA Negative Negative   RBC, UA Trace (A) Negative   Bilirubin, UA Negative Negative   Urobilinogen, Ur 0.2 0.2 - 1.0 mg/dL   Nitrite, UA Negative Negative   Microscopic Examination See below:     Comment: Microscopic was indicated and was performed.  Microscopic Examination     Status: Abnormal   Collection Time: 06/21/22  9:45 AM   Urine  Result Value Ref Range   WBC, UA >30 (A) 0 - 5 /hpf   RBC,  Urine 0-2 0 - 2 /hpf   Epithelial Cells (non renal) 0-10 0 - 10 /hpf   Bacteria, UA Moderate (A) None seen/Few     Studies/Results: No results found. Hospital records reviewed.     Assessment/Plan: Elevated PSA of 12.6 with a benign exam and minimal LUTS.   His biopsy was negative and subsequent PSA's have been very low but we don't have one prior to this visit.   His testosterone level was normal despite chronic oxycodone. I will get a PSA today and if it remains low he can return prn.  Nocturia.   His is doing well off of Myrbetriq and will not resume that med.Marland Kitchen    Pyuria with bacteria.   Culture today.   ED with CAD.      No orders of the defined types were placed in this encounter.     Orders Placed This Encounter  Procedures   Urine Culture   Microscopic  Examination   Urinalysis, Routine w reflex microscopic   PSA, total and free     Return if symptoms worsen or fail to improve.    CC: Louie Bun NP     Irine Seal 06/22/2022 [phone]

## 2022-06-22 ENCOUNTER — Telehealth: Payer: Self-pay

## 2022-06-22 ENCOUNTER — Other Ambulatory Visit: Payer: Self-pay | Admitting: Internal Medicine

## 2022-06-22 DIAGNOSIS — R918 Other nonspecific abnormal finding of lung field: Secondary | ICD-10-CM

## 2022-06-22 LAB — PSA, TOTAL AND FREE
PSA, Free Pct: 21.7 %
PSA, Free: 0.13 ng/mL
Prostate Specific Ag, Serum: 0.6 ng/mL (ref 0.0–4.0)

## 2022-06-22 NOTE — Telephone Encounter (Signed)
Made patient aware that his PSA is up slightly but remains below 1.  This could be from infection with the abnormal urine.   The culture is pending. Patient voiced understanding

## 2022-06-22 NOTE — Telephone Encounter (Signed)
-----   Message from Irine Seal, MD sent at 06/22/2022 10:08 AM EST ----- His PSA is up slightly but remains below 1.  This could be from infection with the abnormal urine.   The culture is pending.  ----- Message ----- From: Sherrilyn Rist, CMA Sent: 06/22/2022   8:03 AM EST To: Irine Seal, MD  Please review

## 2022-06-25 ENCOUNTER — Other Ambulatory Visit: Payer: Self-pay | Admitting: Urology

## 2022-06-25 ENCOUNTER — Telehealth: Payer: Self-pay

## 2022-06-25 DIAGNOSIS — N3000 Acute cystitis without hematuria: Secondary | ICD-10-CM

## 2022-06-25 LAB — URINE CULTURE

## 2022-06-25 MED ORDER — SULFAMETHOXAZOLE-TRIMETHOPRIM 800-160 MG PO TABS: 1.0000 | ORAL_TABLET | Freq: Two times a day (BID) | ORAL | 0 refills | Status: DC

## 2022-06-25 NOTE — Telephone Encounter (Signed)
-----   Message from Irine Seal, MD sent at 06/25/2022  1:15 PM EST ----- I sent bactrim to the pharmacy for his positive culture.  ----- Message ----- From: Sherrilyn Rist, CMA Sent: 06/25/2022  11:28 AM EST To: Irine Seal, MD  Please Review

## 2022-06-25 NOTE — Telephone Encounter (Signed)
Made patient aware that Dr. Jeffie Pollock sent in Bactrim to cover his positive urine culture. Patient voiced understanding

## 2022-06-26 DIAGNOSIS — Z794 Long term (current) use of insulin: Secondary | ICD-10-CM | POA: Diagnosis not present

## 2022-06-26 DIAGNOSIS — Z23 Encounter for immunization: Secondary | ICD-10-CM | POA: Diagnosis not present

## 2022-06-26 DIAGNOSIS — E1165 Type 2 diabetes mellitus with hyperglycemia: Secondary | ICD-10-CM | POA: Diagnosis not present

## 2022-07-04 ENCOUNTER — Ambulatory Visit (INDEPENDENT_AMBULATORY_CARE_PROVIDER_SITE_OTHER): Payer: Medicare Other | Admitting: Physician Assistant

## 2022-07-04 ENCOUNTER — Encounter: Payer: Self-pay | Admitting: Physician Assistant

## 2022-07-04 ENCOUNTER — Other Ambulatory Visit: Payer: Self-pay | Admitting: Internal Medicine

## 2022-07-04 VITALS — BP 133/72 | HR 68

## 2022-07-04 DIAGNOSIS — Z87440 Personal history of urinary (tract) infections: Secondary | ICD-10-CM | POA: Diagnosis not present

## 2022-07-04 DIAGNOSIS — R972 Elevated prostate specific antigen [PSA]: Secondary | ICD-10-CM

## 2022-07-04 DIAGNOSIS — N400 Enlarged prostate without lower urinary tract symptoms: Secondary | ICD-10-CM | POA: Diagnosis not present

## 2022-07-04 DIAGNOSIS — N3000 Acute cystitis without hematuria: Secondary | ICD-10-CM

## 2022-07-04 LAB — POCT URINALYSIS DIPSTICK
Bilirubin, UA: NEGATIVE
Glucose, UA: POSITIVE — AB
Ketones, UA: NEGATIVE
Leukocytes, UA: NEGATIVE
Nitrite, UA: NEGATIVE
Protein, UA: NEGATIVE
Spec Grav, UA: <=1.005 — AB (ref 1.010–1.025)
Urobilinogen, UA: 0.2 E.U./dL
pH, UA: 5 (ref 5.0–8.0)

## 2022-07-04 NOTE — Progress Notes (Signed)
Assessment: 1. Acute cystitis without hematuria - POCT urinalysis dipstick  2. Elevated PSA  3. Benign prostatic hyperplasia without lower urinary tract symptoms    Plan: Patient is reassured that his urine is clear today without evidence of infection.  Follow-up for PSA, total and free as ordered by Dr. Jeffie Pollock with office visit following lab appointment to discuss results.   Chief Complaint: No chief complaint on file.   HPI: [name] is a 61 y.o. male with history of elevated PSA who presents for continued evaluation of recent culture positive UTI treated with Bactrim DS.  The patient grew Enterobacter cloacae resistant to Augmentin, cefazolin, cefuroxime, Macrobid.  Since treatment, he states he is doing well without burning, urgency, gross hematuria.  Minimal LUTS.  UA= clear   06/21/22 [name] is a 61 yo male who is sent in consultation by Dr. Cindie Laroche for an elevated PSA of 12.6 on 09/03/19.  Prior levels are not available. He is voiding well with an IPSS of 1.  He has nocturia x 1.  He has been on Myrbetriq '25mg'$  for an episode of enuresis 68moago.   He has mild post void dribbling.  He has a history of CAD and has ED.   He has no history of UTI's or GU surgery.   He had no other new complaints at the time of the blood draw.    06/24/20: Mr. LLowenthalreturns today in f/u for his history of an elevated PSA with a negative biopsy on 12/25/19 by Dr. MAlyson Ingles  He had a prostate volume of 271m  His PSA on 06/17/20 was 0.3.  His PSA on 12/16/19 was 0.1.    He is voiding well with an IPSS of 1.   He had COVID on 05/03/20 and was treated with the infusion and did well.  He remains on myrbetriq for his history of enuresis.  He is on chronic oxycodone and is on 5-'10mg'$  daily.      06/22/21: EdCollyneturns today in f/u.   He is doing well on Myrbetriq '25mg'$  daily with no enuresis.  He has had no hematuria or dysuria.  He hasn't had a recent PSA.  He remains on Oxycodone.      06/21/22:  EdLorreneturns today in f/u.  He reports he is no longer taking the Myrbetriq.  His IPSS is 2 with only nocturia. He has had no hematuria or dysuria.  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 He has not had a PSA prior to this visit.   UA has 3+ glucose, 30 WBC and bacteria.  Portions of the above documentation were copied from a prior visit for review purposes only.  Allergies: No Known Allergies  PMH: Past Medical History:  Diagnosis Date   Arthritis    "some; in hips sometimes" (01/02/2013)   Back pain    "w/prolonged walking or standing" (01/02/2013)   CAD (coronary artery disease)    CABG X 4 08/2012   COPD (chronic obstructive pulmonary disease) (HCC)    Elevated PSA    Esophageal erosions 05/24/2001   egd by Dr. RoGala Romney GERD (gastroesophageal reflux disease)    H/O hiatal hernia    Headache    History of gout    Hyperlipidemia    Hypertension    not on any medication for htn at present   PAD (peripheral artery disease) ([place]  LEA DOPPLER, 11/11/2012 - moderate arterial insufficiency to lower extremities at rest, BILATERAL SFA-demonstrates occlusive disease with reconstitution of flow   S/P percutaneous transluminal angioplasty (PTA) with stent placement 03/29/21 03/31/2021   Tobacco abuse    Tubular adenoma of colon 08/28/2011   Type II diabetes mellitus ([place])    Unintentional weight loss     PSH: Past Surgical History:  Procedure Laterality Date   ABDOMINAL AORTOGRAM W/LOWER EXTREMITY N/A 03/30/2021   Procedure: ABDOMINAL AORTOGRAM W/LOWER EXTREMITY;  Surgeon: Lorretta Harp, MD;  Location: [facility];  Service: Cardiovascular;  Laterality: N/A;   ABDOMINAL AORTOGRAM W/LOWER EXTREMITY N/A 04/12/2022   Procedure: ABDOMINAL AORTOGRAM W/LOWER EXTREMITY;  Surgeon: Lorretta Harp, MD;  Location: [facility];  Service: Cardiovascular;  Laterality: N/A;   ANTERIOR CERVICAL  DECOMP/DISCECTOMY FUSION  12/19/2011   Procedure: ANTERIOR CERVICAL DECOMPRESSION/DISCECTOMY FUSION 2 LEVELS;  Surgeon: Floyce Stakes, MD;  Location: [facility];  Service: Neurosurgery;  Laterality: N/A;  Cervical four-five Cervical five-six  Anterior cervical decompression/diskectomy, fusion, plate   ATHERECTOMY N/A 01/02/2013   Procedure: ATHERECTOMY;  Surgeon: Lorretta Harp, MD;  Location: [facility];  Service: Cardiovascular;  Laterality: N/A;   BACK SURGERY     x3   CARDIAC CATHETERIZATION  08/19/2012   CABG warranted; Severe ostial LCx stenosis with mid to distal vessel occlusion   COLONOSCOPY W/ POLYPECTOMY  08/28/11   Rourk-tubular adenomas removed from ascending colon, suboptimal prep, diminutive rectal polyps   COLONOSCOPY WITH PROPOFOL N/A 06/13/2017   Procedure: COLONOSCOPY WITH PROPOFOL;  Surgeon: Daneil Dolin, MD;  [facility];  Service: Endoscopy;  Laterality: N/A;  9:15am   CORONARY ARTERY BYPASS GRAFT  08/22/2012   Procedure: CORONARY ARTERY BYPASS GRAFTING (CABG);  Surgeon: Grace Isaac, MD;  Location: [facility];  Service: Open Heart Surgery;  Laterality: N/A;  CABG x four,  using left internal mammary artery and right leg greater saphenous vein harvested endoscopically   ESOPHAGOGASTRODUODENOSCOPY  05/24/01   by Dr. Gala Romney   ESOPHAGOGASTRODUODENOSCOPY  08/28/11   Rourk-erosive reflux esophagitis, gastric erosions   ESOPHAGOGASTRODUODENOSCOPY (EGD) WITH PROPOFOL N/A 06/13/2017   Procedure: ESOPHAGOGASTRODUODENOSCOPY (EGD) WITH PROPOFOL;  Surgeon: Daneil Dolin, MD;  [facility];  Service: Endoscopy;  Laterality: N/A;   INTRAOPERATIVE TRANSESOPHAGEAL ECHOCARDIOGRAM  08/22/2012   Procedure: INTRAOPERATIVE TRANSESOPHAGEAL ECHOCARDIOGRAM;  Surgeon: Grace Isaac, MD;  Location: [facility];  Service: Open Heart Surgery;  Laterality: N/A;   IR 3D INDEPENDENT WKST  05/17/2022   IR ANGIO INTRA EXTRACRAN SEL INTERNAL CAROTID UNI R MOD SED  05/17/2022   IR  ANGIOGRAM FOLLOW UP STUDY  05/17/2022   IR NEURO EACH ADD'L AFTER BASIC UNI RIGHT (MS)  05/17/2022   IR TRANSCATH/EMBOLIZ  05/17/2022   IR US GUIDE VASC ACCESS RIGHT  05/17/2022   LEFT HEART CATHETERIZATION WITH CORONARY ANGIOGRAM N/A 08/19/2012   Procedure: LEFT HEART CATHETERIZATION WITH CORONARY ANGIOGRAM;  Surgeon: [name], MD;  Location: [facility] CATH LAB;  Service: Cardiovascular;  Laterality: N/A;   LOWER EXTREMITY ANGIOGRAM N/A 11/17/2012   Procedure: LOWER EXTREMITY ANGIOGRAM;  Surgeon: Lorretta Harp, MD;  Location: [facility];  Service: Cardiovascular;  Laterality: N/A;   LOWER EXTREMITY ANGIOGRAM N/A 02/02/2013   Procedure: LOWER EXTREMITY ANGIOGRAM;  Surgeon: Lorretta Harp, MD;  Location: [facility] CATH LAB;  Service: Cardiovascular;  Laterality: N/A;   LUMBAR DISC SURGERY     LUMBAR FUSION  ~ 2011   PELVIC  ABCESS DRAINAGE     x2   PERIPHERAL VASCULAR ATHERECTOMY  03/30/2021   Procedure: PERIPHERAL VASCULAR ATHERECTOMY;  Surgeon: Lorretta Harp, MD;  Location: [place] [facility];  Service: Cardiovascular;;  left common iliac artery   PERIPHERAL VASCULAR BALLOON ANGIOPLASTY Left 04/12/2022   Procedure: PERIPHERAL VASCULAR BALLOON ANGIOPLASTY;  Surgeon: Lorretta Harp, MD;  Location: [place];  Service: Cardiovascular;  Laterality: Left;  SFA   PERIPHERAL VASCULAR INTERVENTION  03/30/2021   Procedure: PERIPHERAL VASCULAR INTERVENTION;  Surgeon: Lorretta Harp, MD;  Location: [facility] [facility];  Service: Cardiovascular;;   POLYPECTOMY  06/13/2017   Procedure: POLYPECTOMY;  Surgeon: Daneil Dolin, MD;  [facility];  Service: Endoscopy;;  colon   RADIOLOGY WITH ANESTHESIA N/A 05/17/2022   Procedure: Diagnostic Cerebral Angiogram, Possible Aneurysm Coiling, Possible Stent;  Surgeon: Consuella Lose, MD;  Location: [facility];  Service: Radiology;  Laterality: N/A;   SHOULDER SURGERY Right    VASCULAR SURGERY Right 01/02/2013   diamondback orbital rotational   atherectomy, chocolate balloon and IDEV  Stent.    SH: Social History   Tobacco Use   Smoking status: Every Day    Packs/day: 1.00    Years: 39.00    Total pack years: 39.00    Types: Cigarettes   Smokeless tobacco: Never  Vaping Use   Vaping Use: Never used  Substance Use Topics   Alcohol use: Yes    Alcohol/week: 6.0 standard drinks of alcohol    Types: 6 Cans of beer per week   Drug use: No    ROS: All other review of systems were reviewed and are negative except what is noted above in HPI  PE: BP 133/72   Pulse 68  GENERAL APPEARANCE:  Well appearing, well developed, well nourished, NAD HEENT:  Atraumatic, normocephalic NECK:  Supple. Trachea midline ABDOMEN:  Soft, non-tender, no masses EXTREMITIES:  Moves all extremities well, without clubbing, cyanosis, or edema NEUROLOGIC:  Alert and oriented x 3, normal gait MENTAL STATUS:  appropriate BACK:  Non-tender to palpation, No CVAT SKIN:  Warm, dry, and intact   Results: Laboratory Data: Lab Results  Component Value Date   WBC 6.7 05/17/2022   HGB 15.0 05/17/2022   HCT 46.0 05/17/2022   MCV 94.5 05/17/2022   PLT 144 (L) 05/17/2022    Lab Results  Component Value Date   CREATININE 0.82 05/17/2022    No results found for: "PSA"  Lab Results  Component Value Date   TESTOSTERONE 324 06/22/2021    Lab Results  Component Value Date   HGBA1C 9.7 (H) 05/17/2022    Urinalysis    Component Value Date/Time   COLORURINE STRAW (A) 05/17/2022 1244   APPEARANCEUR Cloudy (A) 06/21/2022 0945   LABSPEC 1.010 05/17/2022 1244   PHURINE 5.0 05/17/2022 1244   GLUCOSEU 3+ (A) 06/21/2022 0945   HGBUR NEGATIVE 05/17/2022 1244   BILIRUBINUR negative 07/04/2022 1520   BILIRUBINUR Negative 06/21/2022 0945   KETONESUR NEGATIVE 05/17/2022 1244   PROTEINUR Negative 07/04/2022 1520   PROTEINUR 1+ (A) 06/21/2022 0945   PROTEINUR NEGATIVE 05/17/2022 1244   UROBILINOGEN 0.2 07/04/2022 1520   UROBILINOGEN 0.2  07/21/2013 1810   NITRITE negative 07/04/2022 1520   NITRITE Negative 06/21/2022 0945   NITRITE NEGATIVE 05/17/2022 1244   LEUKOCYTESUR Negative 07/04/2022 1520   LEUKOCYTESUR Trace (A) 06/21/2022 0945   LEUKOCYTESUR NEGATIVE 05/17/2022 1244    Lab Results  Component Value Date   LABMICR See below:  06/21/2022   WBCUA >30 (A) 06/21/2022   LABEPIT 0-10 06/21/2022   MUCUS Present 06/22/2021   BACTERIA Moderate (A) 06/21/2022    Pertinent Imaging: No results found for this or any previous visit.  No results found for this or any previous visit.  No results found for this or any previous visit.  No results found for this or any previous visit.  No results found for this or any previous visit.  No valid procedures specified. No results found for this or any previous visit.  No results found for this or any previous visit.  Results for orders placed or performed in visit on 07/04/22 (from the past 24 hour(s))  POCT urinalysis dipstick   Collection Time: 07/04/22  3:20 PM  Result Value Ref Range   Color, UA     Clarity, UA     Glucose, UA Positive (A) Negative   Bilirubin, UA negative    Ketones, UA negative    Spec Grav, UA <=1.005 (A) 1.010 - 1.025   Blood, UA Trace-intact    pH, UA 5.0 5.0 - 8.0   Protein, UA Negative Negative   Urobilinogen, UA 0.2 0.2 or 1.0 E.U./dL   Nitrite, UA negative    Leukocytes, UA Negative Negative   Appearance     Odor

## 2022-07-05 NOTE — Telephone Encounter (Signed)
*  STAT* If patient is at the pharmacy, call can be transferred to refill team.   1. Which medications need to be refilled? (please list name of each medication and dose if known)  metoprolol succinate (TOPROL-XL) 25 MG 24 hr tablet   2. Which pharmacy/location (including street and city if local pharmacy) is medication to be sent to?  [facility], [address]    3. Do they need a 30 day or 90 day supply? 90 day  Patient is out of medication

## 2022-07-19 ENCOUNTER — Encounter: Payer: Self-pay | Admitting: *Deleted

## 2022-07-19 NOTE — Patient Instructions (Signed)
Procedure: colonoscopy Estimated body mass index is 27.2 kg/m as calculated from the following:   Height as of this encounter: _0  (1.803 m).   Weight as of this encounter: 195 lb (88.5 kg).   Have you had a colonoscopy before?  Yes, 2022  Do you have family history of colon cancer?  No  Do you have a family history of polyps? No  Previous colonoscopy with polyps removed? Yes, 2022  Do you have a history colorectal cancer?   No  Are you diabetic?  yes  Do you have a prosthetic or mechanical heart valve? No  Do you have a pacemaker/defibrillator?   No  Have you had endocarditis/atrial fibrillation?  No  Do you use supplemental oxygen/CPAP?  No  Have you had joint replacement within the last 12 months?  No  Do you tend to be constipated or have to use laxatives?  No   Do you have history of alcohol use? If yes, how much and how often.  No  Do you have history or are you using drugs? If yes, what do are you  using?  No  Have you ever had a stroke/heart attack?  No  Have you ever had a heart or other vascular stent placed,? Yes 2023  Do you take weight loss medication? No  Do you take any blood-thinning medications such as: (Plavix, aspirin, Coumadin, Aggrenox, Brilinta, Xarelto, Eliquis, Pradaxa, Savaysa or Effient)? Plavix & Aspirin  If yes we need the name, milligram, dosage and who is prescribing doctor:  Plavix 75 mg, Aspirin 81 mg             Current Outpatient Medications  Medication Sig Dispense Refill   acetaminophen (TYLENOL) 650 MG CR tablet Take 650 mg by mouth every 8 (eight) hours as needed for pain.     aspirin EC 81 MG EC tablet Take 1 tablet (81 mg total) by mouth daily.     Blood Glucose Monitoring Suppl (ONETOUCH VERIO FLEX SYSTEM) w/Device KIT      carboxymethylcellulose (REFRESH PLUS) 0.5 % SOLN Place 1 drop into both eyes 3 (three) times daily as needed (dry eyes).     clopidogrel (PLAVIX) 75 MG tablet Take 1 tablet (75 mg total) by mouth  daily with breakfast. 30 tablet 11   cyclobenzaprine (FLEXERIL) 10 MG tablet Take 1 tablet (10 mg total) by mouth 3 (three) times daily as needed for muscle spasms. 50 tablet 1   empagliflozin (JARDIANCE) 25 MG TABS tablet Take 25 mg by mouth daily.     gabapentin (NEURONTIN) 300 MG capsule Take 300 mg by mouth at bedtime.     HUMALOG MIX 75/25 KWIKPEN (75-25) 100 UNIT/ML Kwikpen Inject 50 Units into the skin in the morning and at bedtime.     isosorbide mononitrate (IMDUR) 30 MG 24 hr tablet Take 1 tablet (30 mg total) by mouth daily. 90 tablet 3   Lancets (ONETOUCH DELICA PLUS ERDEYC14G) MISC Apply topically daily.     Lancets (ONETOUCH DELICA PLUS [id number]) MISC Apply topically 2 (two) times daily.     LANTUS SOLOSTAR 100 UNIT/ML Solostar Pen Inject 50 Units into the skin 2 (two) times daily.     losartan (COZAAR) 50 MG tablet Take 50 mg by mouth daily.     Loteprednol Etabonate (EYSUVIS) 0.25 % SUSP Place 1 drop into both eyes at bedtime.     metoprolol succinate (TOPROL-XL) 25 MG 24 hr tablet TAKE 1 TABLET BY MOUTH ONCE A DAY. 90 tablet  0   ONETOUCH VERIO test strip 2 (two) times daily.     oxyCODONE-acetaminophen (PERCOCET) 10-325 MG tablet Take 1 tablet by mouth 4 (four) times daily as needed for pain.     PROAIR HFA 108 (90 BASE) MCG/ACT inhaler Inhale 2 puffs into the lungs every 6 (six) hours as needed for wheezing or shortness of breath.      rosuvastatin (CRESTOR) 40 MG tablet Take 1 tablet (40 mg total) by mouth daily. 30 tablet 1   zolpidem (AMBIEN CR) 12.5 MG CR tablet Take 12.5 mg by mouth at bedtime.     No current facility-administered medications for this visit.    No Known Allergies

## 2022-08-10 NOTE — Progress Notes (Signed)
Recent aneurysm emobolization in October 2023 and on anticoagulation. ASA 3. Needs OV

## 2022-08-13 ENCOUNTER — Encounter: Payer: Self-pay | Admitting: Internal Medicine

## 2022-08-13 DIAGNOSIS — R030 Elevated blood-pressure reading, without diagnosis of hypertension: Secondary | ICD-10-CM | POA: Diagnosis not present

## 2022-08-13 DIAGNOSIS — S20214A Contusion of middle front wall of thorax, initial encounter: Secondary | ICD-10-CM | POA: Diagnosis not present

## 2022-08-13 NOTE — Progress Notes (Signed)
Please schedule. thanks

## 2022-08-15 ENCOUNTER — Other Ambulatory Visit (HOSPITAL_COMMUNITY): Payer: Self-pay | Admitting: Adult Health Nurse Practitioner

## 2022-08-15 ENCOUNTER — Encounter (HOSPITAL_COMMUNITY): Payer: Self-pay | Admitting: Radiology

## 2022-08-15 ENCOUNTER — Ambulatory Visit (HOSPITAL_COMMUNITY)
Admission: RE | Admit: 2022-08-15 | Discharge: 2022-08-15 | Disposition: A | Discharge status: Home / Self Care | Payer: 59 | Source: Ambulatory Visit | Attending: Adult Health Nurse Practitioner | Admitting: Adult Health Nurse Practitioner

## 2022-08-15 DIAGNOSIS — E1143 Type 2 diabetes mellitus with diabetic autonomic (poly)neuropathy: Secondary | ICD-10-CM | POA: Diagnosis not present

## 2022-08-15 DIAGNOSIS — R55 Syncope and collapse: Secondary | ICD-10-CM | POA: Diagnosis not present

## 2022-08-15 DIAGNOSIS — R590 Localized enlarged lymph nodes: Secondary | ICD-10-CM

## 2022-08-15 DIAGNOSIS — M25551 Pain in right hip: Secondary | ICD-10-CM | POA: Insufficient documentation

## 2022-08-15 DIAGNOSIS — R918 Other nonspecific abnormal finding of lung field: Secondary | ICD-10-CM | POA: Diagnosis not present

## 2022-08-15 DIAGNOSIS — J439 Emphysema, unspecified: Secondary | ICD-10-CM | POA: Diagnosis not present

## 2022-08-15 DIAGNOSIS — W19XXXA Unspecified fall, initial encounter: Secondary | ICD-10-CM | POA: Diagnosis not present

## 2022-08-15 MED ORDER — IOHEXOL 300 MG/ML  SOLN
75.0000 mL | Freq: Once | INTRAMUSCULAR | Status: AC | PRN
  Administered 2022-08-15: 75 mL via INTRAVENOUS

## 2022-08-22 DIAGNOSIS — M87051 Idiopathic aseptic necrosis of right femur: Secondary | ICD-10-CM | POA: Diagnosis not present

## 2022-08-27 LAB — POCT I-STAT CREATININE: Creatinine, Ser: 0.9 mg/dL (ref 0.61–1.24)

## 2022-09-06 ENCOUNTER — Telehealth: Payer: Self-pay | Admitting: *Deleted

## 2022-09-06 NOTE — Telephone Encounter (Signed)
Please arrange virtual telephone visit with preop APP next week for final preop clearance.

## 2022-09-06 NOTE — Telephone Encounter (Signed)
   Pre-operative Risk Assessment    Patient Name: [name]  DOB: [birth year] MRN: [id number]      Request for Surgical Clearance    Procedure:   right total hip replacement  Date of Surgery:  Clearance TBD                                 Surgeon:  Dr. Marchia Bond Surgeon's Group or Practice Name:  [facility] Phone number:  [phone] Fax number:  [phone]   Type of Clearance Requested:   - Medical  - Pharmacy:  Hold Aspirin and Clopidogrel (Plavix)     Type of Anesthesia:  Spinal   Additional requests/questions:    Hervey Ard   09/06/2022, 11:26 AM

## 2022-09-06 NOTE — Telephone Encounter (Signed)
Dr. [name] to review.  Patient had a history of PAD, underwent multiple lower extremity angiography.  Last lower extremity intervention was on 04/12/2022 at which time he had a 99% subtotally occluded proximal left SFA treated with drug-coated balloon angioplasty.  Postprocedure, he did well with left lower extremity ABI improved from the previous of 0.61 to 0.85.  Since the last visit, patient underwent diagnostic cerebral angiogram with coil embolization of right MCA aneurysm by Dr. Kathyrn Sheriff on 05/17/2022.  He is close to 5 months out from the last drug-coated balloon angioplasty.  Dr. [name], from your perspective, can the patient hold aspirin and Plavix prior to right total hip replacement?  Please forward your response to [facility] PREOP

## 2022-09-07 ENCOUNTER — Telehealth: Payer: Self-pay | Admitting: *Deleted

## 2022-09-07 NOTE — Telephone Encounter (Signed)
Pt agreeable to tele pre op appt 09/12/22 @ 2:40. Med rec and consent are done.

## 2022-09-07 NOTE — Telephone Encounter (Signed)
Left message to call back to schedule tele pre op appt.  

## 2022-09-07 NOTE — Telephone Encounter (Signed)
Pt agreeable to tele pre op appt 09/12/22 @ 2:40. Med rec and consent are done.    Patient Consent for Virtual Visit        [name] has provided verbal consent on 09/07/2022 for a virtual visit (video or telephone).   CONSENT FOR VIRTUAL VISIT FOR:  [name]  By participating in this virtual visit I agree to the following:  I hereby voluntarily request, consent and authorize [facility] and its employed or contracted physicians, physician assistants, nurse practitioners or other licensed health care professionals (the Practitioner), to provide me with telemedicine health care services (the "Services") as deemed necessary by the treating Practitioner. I acknowledge and consent to receive the Services by the Practitioner via telemedicine. I understand that the telemedicine visit will involve communicating with the Practitioner through live audiovisual communication technology and the disclosure of certain medical information by electronic transmission. I acknowledge that I have been given the opportunity to request an in-person assessment or other available alternative prior to the telemedicine visit and am voluntarily participating in the telemedicine visit.  I understand that I have the right to withhold or withdraw my consent to the use of telemedicine in the course of my care at any time, without affecting my right to future care or treatment, and that the Practitioner or I may terminate the telemedicine visit at any time. I understand that I have the right to inspect all information obtained and/or recorded in the course of the telemedicine visit and may receive copies of available information for a reasonable fee.  I understand that some of the potential risks of receiving the Services via telemedicine include:  Delay or interruption in medical evaluation due to technological equipment failure or disruption; Information transmitted may not be sufficient (e.g. poor resolution of  images) to allow for appropriate medical decision making by the Practitioner; and/or  In rare instances, security protocols could fail, causing a breach of personal health information.  Furthermore, I acknowledge that it is my responsibility to provide information about my medical history, conditions and care that is complete and accurate to the best of my ability. I acknowledge that Practitioner's advice, recommendations, and/or decision may be based on factors not within their control, such as incomplete or inaccurate data provided by me or distortions of diagnostic images or specimens that may result from electronic transmissions. I understand that the practice of medicine is not an exact science and that Practitioner makes no warranties or guarantees regarding treatment outcomes. I acknowledge that a copy of this consent can be made available to me via my patient portal ([facility]), or I can request a printed copy by calling the office of [facility].    I understand that my insurance will be billed for this visit.   I have read or had this consent read to me. I understand the contents of this consent, which adequately explains the benefits and risks of the Services being provided via telemedicine.  I have been provided ample opportunity to ask questions regarding this consent and the Services and have had my questions answered to my satisfaction. I give my informed consent for the services to be provided through the use of telemedicine in my medical care

## 2022-09-07 NOTE — Telephone Encounter (Signed)
Pt is returning call. Requesting return call.

## 2022-09-12 ENCOUNTER — Ambulatory Visit: Payer: 59 | Attending: Cardiovascular Disease | Admitting: Nurse Practitioner

## 2022-09-12 DIAGNOSIS — Z01810 Encounter for preprocedural cardiovascular examination: Secondary | ICD-10-CM | POA: Diagnosis not present

## 2022-09-12 NOTE — Progress Notes (Signed)
Virtual Visit via Telephone Note   Because of Maxx R He's co-morbid illnesses, he is at least at moderate risk for complications without adequate follow up.  This format is felt to be most appropriate for this patient at this time.  The patient did not have access to video technology/had technical difficulties with video requiring transitioning to audio format only (telephone).  All issues noted in this document were discussed and addressed.  No physical exam could be performed with this format.  Please refer to the patient's chart for his consent to telehealth for [facility].  Evaluation Performed:  Preoperative cardiovascular risk assessment _____________   Date:  09/12/2022   Patient ID:  [name], DOB 11/17/1960, MRN [id number] Patient Location:  Home Provider location:   Office  Primary Care Provider:  Pablo Lawrence, NP Primary Cardiologist:  [name], MD  Chief Complaint / Patient Profile   62 y.o. y/o male with a h/o CAD s/p CABG x 4 in 2014, PAD s/p stent to mid right SFA, PTA/DCB-left SFA in 04/2022, cerebral aneurysm s/p coil embolization in 05/2022, hypertension, hyperlipidemia, type 2 diabetes, and tobacco use who is pending right total hip replacement with Dr. Marchia Bond of [facility] and presents today for telephonic preoperative cardiovascular risk assessment.  History of Present Illness    [name] is a 62 y.o. male who presents via audio/video conferencing for a telehealth visit today.  Pt was last seen in cardiology clinic on 04/25/2022 by Dr. [name].  At that time [name] was doing well. The patient is now pending procedure as outlined above. Since his last visit, he has been stable from a cardiac standpoint.   He denies chest pain, palpitations, dyspnea, pnd, orthopnea, n, v, dizziness, syncope, edema, weight gain, or early satiety. All other systems reviewed and are otherwise negative except as noted above.    Past Medical History    Past Medical History:  Diagnosis Date   Arthritis    "some; in hips sometimes" (01/02/2013)   Back pain    "w/prolonged walking or standing" (01/02/2013)   CAD (coronary artery disease)    CABG X 4 08/2012   COPD (chronic obstructive pulmonary disease) (HCC)    Elevated PSA    Esophageal erosions 05/24/2001   egd by Dr. Gala Romney   GERD (gastroesophageal reflux disease)    H/O hiatal hernia    Headache    History of gout    Hyperlipidemia    Hypertension    not on any medication for htn at present   PAD (peripheral artery disease) ([place])    LEA DOPPLER, 11/11/2012 - moderate arterial insufficiency to lower extremities at rest, BILATERAL SFA-demonstrates occlusive disease with reconstitution of flow   S/P percutaneous transluminal angioplasty (PTA) with stent placement 03/29/21 03/31/2021   Tobacco abuse    Tubular adenoma of colon 08/28/2011   Type II diabetes mellitus ([place])    Unintentional weight loss    Past Surgical History:  Procedure Laterality Date   ABDOMINAL AORTOGRAM W/LOWER EXTREMITY N/A 03/30/2021   Procedure: ABDOMINAL AORTOGRAM W/LOWER EXTREMITY;  Surgeon: Lorretta Harp, MD;  Location: [place] [facility];  Service: Cardiovascular;  Laterality: N/A;   ABDOMINAL AORTOGRAM W/LOWER EXTREMITY N/A 04/12/2022   Procedure: ABDOMINAL AORTOGRAM W/LOWER EXTREMITY;  Surgeon: Lorretta Harp, MD;  Location: [place] [facility];  Service: Cardiovascular;  Laterality: N/A;   ANTERIOR CERVICAL DECOMP/DISCECTOMY FUSION  12/19/2011   Procedure: ANTERIOR CERVICAL DECOMPRESSION/DISCECTOMY FUSION  2 LEVELS;  Surgeon: Floyce Stakes, MD;  [facility];  Service: Neurosurgery;  Laterality: N/A;  Cervical four-five Cervical five-six  Anterior cervical decompression/diskectomy, fusion, plate   ATHERECTOMY N/A 01/02/2013   Procedure: ATHERECTOMY;  Surgeon: Lorretta Harp, MD;  Location: [facility] CATH LAB;  Service: Cardiovascular;  Laterality: N/A;   BACK SURGERY      x3   CARDIAC CATHETERIZATION  08/19/2012   CABG warranted; Severe ostial LCx stenosis with mid to distal vessel occlusion   COLONOSCOPY W/ POLYPECTOMY  08/28/11   Rourk-tubular adenomas removed from ascending colon, suboptimal prep, diminutive rectal polyps   COLONOSCOPY WITH PROPOFOL N/A 06/13/2017   Procedure: COLONOSCOPY WITH PROPOFOL;  Surgeon: Daneil Dolin, MD;  [facility];  Service: Endoscopy;  Laterality: N/A;  9:15am   CORONARY ARTERY BYPASS GRAFT  08/22/2012   Procedure: CORONARY ARTERY BYPASS GRAFTING (CABG);  Surgeon: Grace Isaac, MD;  Location: [place];  Service: Open Heart Surgery;  Laterality: N/A;  CABG x four,  using left internal mammary artery and right leg greater saphenous vein harvested endoscopically   ESOPHAGOGASTRODUODENOSCOPY  05/24/01   by Dr. Gala Romney   ESOPHAGOGASTRODUODENOSCOPY  08/28/11   Rourk-erosive reflux esophagitis, gastric erosions   ESOPHAGOGASTRODUODENOSCOPY (EGD) WITH PROPOFOL N/A 06/13/2017   Procedure: ESOPHAGOGASTRODUODENOSCOPY (EGD) WITH PROPOFOL;  Surgeon: Daneil Dolin, MD;  [facility];  Service: Endoscopy;  Laterality: N/A;   INTRAOPERATIVE TRANSESOPHAGEAL ECHOCARDIOGRAM  08/22/2012   Procedure: INTRAOPERATIVE TRANSESOPHAGEAL ECHOCARDIOGRAM;  Surgeon: Grace Isaac, MD;  Location: [place];  Service: Open Heart Surgery;  Laterality: N/A;   IR 3D INDEPENDENT WKST  05/17/2022   IR ANGIO INTRA EXTRACRAN SEL INTERNAL CAROTID UNI R MOD SED  05/17/2022   IR ANGIOGRAM FOLLOW UP STUDY  05/17/2022   IR NEURO EACH ADD'L AFTER BASIC UNI RIGHT (MS)  05/17/2022   IR TRANSCATH/EMBOLIZ  05/17/2022   IR US GUIDE VASC ACCESS RIGHT  05/17/2022   LEFT HEART CATHETERIZATION WITH CORONARY ANGIOGRAM N/A 08/19/2012   Procedure: LEFT HEART CATHETERIZATION WITH CORONARY ANGIOGRAM;  Surgeon: [name], MD;  Location: [facility] CATH LAB;  Service: Cardiovascular;  Laterality: N/A;   LOWER EXTREMITY ANGIOGRAM N/A 11/17/2012   Procedure: LOWER  EXTREMITY ANGIOGRAM;  Surgeon: Lorretta Harp, MD;  Location: [facility] CATH LAB;  Service: Cardiovascular;  Laterality: N/A;   LOWER EXTREMITY ANGIOGRAM N/A 02/02/2013   Procedure: LOWER EXTREMITY ANGIOGRAM;  Surgeon: Lorretta Harp, MD;  Location: [facility] CATH LAB;  Service: Cardiovascular;  Laterality: N/A;   LUMBAR DISC SURGERY     LUMBAR FUSION  ~ 2011   PELVIC ABCESS DRAINAGE     x2   PERIPHERAL VASCULAR ATHERECTOMY  03/30/2021   Procedure: PERIPHERAL VASCULAR ATHERECTOMY;  Surgeon: Lorretta Harp, MD;  Location: [place] [facility];  Service: Cardiovascular;;  left common iliac artery   PERIPHERAL VASCULAR BALLOON ANGIOPLASTY Left 04/12/2022   Procedure: PERIPHERAL VASCULAR BALLOON ANGIOPLASTY;  Surgeon: Lorretta Harp, MD;  Location: [place] [facility];  Service: Cardiovascular;  Laterality: Left;  SFA   PERIPHERAL VASCULAR INTERVENTION  03/30/2021   Procedure: PERIPHERAL VASCULAR INTERVENTION;  Surgeon: Lorretta Harp, MD;  Location: [place] [facility];  Service: Cardiovascular;;   POLYPECTOMY  06/13/2017   Procedure: POLYPECTOMY;  Surgeon: Daneil Dolin, MD;  [facility];  Service: Endoscopy;;  colon   RADIOLOGY WITH ANESTHESIA N/A 05/17/2022   Procedure: Diagnostic Cerebral Angiogram, Possible Aneurysm Coiling, Possible Stent;  Surgeon: Consuella Lose,  MD;  Location: [place];  Service: Radiology;  Laterality: N/A;   SHOULDER SURGERY Right    VASCULAR SURGERY Right 01/02/2013   diamondback orbital rotational  atherectomy, chocolate balloon and IDEV  Stent.    Allergies  No Known Allergies  Home Medications    Prior to Admission medications   Medication Sig Start Date End Date Taking? Authorizing Provider  acetaminophen (TYLENOL) 650 MG CR tablet Take 650 mg by mouth every 8 (eight) hours as needed for pain.    [provider]  aspirin EC 81 MG EC tablet Take 1 tablet (81 mg total) by mouth daily. 09/11/18   Orson Eva, MD  Blood Glucose Monitoring Suppl  ([place]) w/Device KIT  11/21/20   [provider]  carboxymethylcellulose (REFRESH PLUS) 0.5 % SOLN Place 1 drop into both eyes 3 (three) times daily as needed (dry eyes).    [provider]  clopidogrel (PLAVIX) 75 MG tablet Take 1 tablet (75 mg total) by mouth daily with breakfast. 05/03/15   Hilty, Nadean Corwin, MD  cyclobenzaprine (FLEXERIL) 10 MG tablet Take 1 tablet (10 mg total) by mouth 3 (three) times daily as needed for muscle spasms. 10/31/17   Newman Pies, MD  empagliflozin (JARDIANCE) 25 MG TABS tablet Take 25 mg by mouth daily.    [provider]  gabapentin (NEURONTIN) 300 MG capsule Take 300 mg by mouth at bedtime. 08/19/18   [provider]  HUMALOG MIX 75/25 KWIKPEN (75-25) 100 UNIT/ML Kwikpen Inject 50 Units into the skin in the morning and at bedtime. 09/07/18   [provider]  isosorbide mononitrate (IMDUR) 30 MG 24 hr tablet Take 1 tablet (30 mg total) by mouth daily. 04/25/22   Lorretta Harp, MD  Lancets [facility] DELICA PLUS [id number]) MISC Apply topically daily. 02/07/21   [provider]  Lancets (ONETOUCH DELICA PLUS [id number]) MISC Apply topically 2 (two) times daily. 11/21/20   [provider]  LANTUS SOLOSTAR 100 UNIT/ML Solostar Pen Inject 50 Units into the skin 2 (two) times daily. 09/08/19   [provider]  losartan (COZAAR) 50 MG tablet Take 50 mg by mouth daily. 04/27/22   [provider]  Loteprednol Etabonate (EYSUVIS) 0.25 % SUSP Place 1 drop into both eyes at bedtime.    [provider]  metoprolol succinate (TOPROL-XL) 25 MG 24 hr tablet TAKE 1 TABLET BY MOUTH ONCE A DAY. 07/05/22   Hilty, Nadean Corwin, MD  [facility] VERIO test strip 2 (two) times daily. 11/21/20   [provider]  oxyCODONE-acetaminophen (PERCOCET) 10-325 MG tablet Take 1 tablet by mouth 4 (four) times daily as needed for pain. 02/20/21   [provider]  PROAIR HFA 108 (90  BASE) MCG/ACT inhaler Inhale 2 puffs into the lungs every 6 (six) hours as needed for wheezing or shortness of breath.  08/14/13   [provider]  rosuvastatin (CRESTOR) 40 MG tablet Take 1 tablet (40 mg total) by mouth daily. 09/10/18   Orson Eva, MD  zolpidem (AMBIEN CR) 12.5 MG CR tablet Take 12.5 mg by mouth at bedtime. 06/15/20   [provider]    Physical Exam    Vital Signs:  [name] does not have vital signs available for review today.  Given telephonic nature of communication, physical exam is limited. AAOx3. NAD. Normal affect.  Speech and respirations are unlabored.  Accessory Clinical Findings    None  Assessment & Plan    1.  Preoperative  Cardiovascular Risk Assessment:  According to the Revised Cardiac Risk Index (RCRI), his Perioperative Risk of Major Cardiac Event is (%): 11. His Functional Capacity in METs is: 4.73 according to the [place] Activity Status Index (DASI).Therefore, based on ACC/AHA guidelines, patient would be at acceptable risk for the planned procedure without further cardiovascular testing.   The patient was advised that if he develops new symptoms prior to surgery to contact our office to arrange for a follow-up visit, and he verbalized understanding.  Per Dr. [name], from a cardiac standpoint, he may hold Plavix for 5 days prior to procedure. Please resume Plavix as soon as possible postprocedure, at the discretion of the surgeon. Regarding ASA therapy, we recommend continuation of ASA throughout the perioperative period.  However, if the surgeon feels that cessation of ASA is required in the perioperative period, it may be stopped 5-7 days prior to surgery with a plan to resume it as soon as felt to be feasible from a surgical standpoint in the post-operative period.  However, patient is s/p cerebral aneurysm, coil embolization in 05/2022.  Therefore, recommendations for holding aspirin and Plavix prior to surgery should also come from  neurosurgery.   A copy of this note will be routed to requesting surgeon.  Time:   Today, I have spent 7 minutes with the patient with telehealth technology discussing medical history, symptoms, and management plan.     Lenna Sciara, NP  09/12/2022, 2:51 PM

## 2022-09-18 NOTE — Progress Notes (Unsigned)
GI Office Note    Referring Provider: Pablo Lawrence, NP Primary Care Physician:  Pablo Lawrence, NP  Primary Gastroenterologist: Cristopher Estimable.Rourk, MD   Chief Complaint   No chief complaint on file.   History of Present Illness   [name] is a 62 y.o. male presenting today at the request of Pablo Lawrence, NP for ***evaluation prior to scheduling colonoscopy.  EGD 06/13/2017: -Normal esophagus, stomach. -Mucosal changes in the duodenum, tiny bulbar erosions   Colonoscopy 06/13/2017: -5 mm polyp in the sigmoid colon (hyperplastic) -Internal hemorrhoids -Repeat colonoscopy in 5 years  In patients triage encounter patient reported colonoscopy in 2022.  Do not see record of this in chart.  Patient had preoperative telephone visit 09/12/2022 with cardiology and deemed to be at acceptable risk for hip replacement for patient.   Today:     Current Outpatient Medications  Medication Sig Dispense Refill   acetaminophen (TYLENOL) 650 MG CR tablet Take 650 mg by mouth every 8 (eight) hours as needed for pain.     aspirin EC 81 MG EC tablet Take 1 tablet (81 mg total) by mouth daily.     Blood Glucose Monitoring Suppl (ONETOUCH VERIO FLEX SYSTEM) w/Device KIT      carboxymethylcellulose (REFRESH PLUS) 0.5 % SOLN Place 1 drop into both eyes 3 (three) times daily as needed (dry eyes).     clopidogrel (PLAVIX) 75 MG tablet Take 1 tablet (75 mg total) by mouth daily with breakfast. 30 tablet 11   cyclobenzaprine (FLEXERIL) 10 MG tablet Take 1 tablet (10 mg total) by mouth 3 (three) times daily as needed for muscle spasms. 50 tablet 1   empagliflozin (JARDIANCE) 25 MG TABS tablet Take 25 mg by mouth daily.     gabapentin (NEURONTIN) 300 MG capsule Take 300 mg by mouth at bedtime.     HUMALOG MIX 75/25 KWIKPEN (75-25) 100 UNIT/ML Kwikpen Inject 50 Units into the skin in the morning and at bedtime.     isosorbide mononitrate (IMDUR) 30 MG 24 hr tablet Take 1 tablet (30 mg total)  by mouth daily. 90 tablet 3   Lancets (ONETOUCH DELICA PLUS [id number]) MISC Apply topically daily.     Lancets (ONETOUCH DELICA PLUS [id number]) MISC Apply topically 2 (two) times daily.     LANTUS SOLOSTAR 100 UNIT/ML Solostar Pen Inject 50 Units into the skin 2 (two) times daily.     losartan (COZAAR) 50 MG tablet Take 50 mg by mouth daily.     Loteprednol Etabonate (EYSUVIS) 0.25 % SUSP Place 1 drop into both eyes at bedtime.     metoprolol succinate (TOPROL-XL) 25 MG 24 hr tablet TAKE 1 TABLET BY MOUTH ONCE A DAY. 90 tablet 0   ONETOUCH VERIO test strip 2 (two) times daily.     oxyCODONE-acetaminophen (PERCOCET) 10-325 MG tablet Take 1 tablet by mouth 4 (four) times daily as needed for pain.     PROAIR HFA 108 (90 BASE) MCG/ACT inhaler Inhale 2 puffs into the lungs every 6 (six) hours as needed for wheezing or shortness of breath.      rosuvastatin (CRESTOR) 40 MG tablet Take 1 tablet (40 mg total) by mouth daily. 30 tablet 1   zolpidem (AMBIEN CR) 12.5 MG CR tablet Take 12.5 mg by mouth at bedtime.     No current facility-administered medications for this visit.    Past Medical History:  Diagnosis Date   Arthritis    "some; in hips sometimes" (01/02/2013)   Back  pain    "w/prolonged walking or standing" (01/02/2013)   CAD (coronary artery disease)    CABG X 4 08/2012   COPD (chronic obstructive pulmonary disease) (HCC)    Elevated PSA    Esophageal erosions 05/24/2001   egd by Dr. Gala Romney   GERD (gastroesophageal reflux disease)    H/O hiatal hernia    Headache    History of gout    Hyperlipidemia    Hypertension    not on any medication for htn at present   PAD (peripheral artery disease) ([place])    LEA DOPPLER, 11/11/2012 - moderate arterial insufficiency to lower extremities at rest, BILATERAL SFA-demonstrates occlusive disease with reconstitution of flow   S/P percutaneous transluminal angioplasty (PTA) with stent placement 03/29/21 03/31/2021   Tobacco abuse    Tubular adenoma  of colon 08/28/2011   Type II diabetes mellitus ([place])    Unintentional weight loss     Past Surgical History:  Procedure Laterality Date   ABDOMINAL AORTOGRAM W/LOWER EXTREMITY N/A 03/30/2021   Procedure: ABDOMINAL AORTOGRAM W/LOWER EXTREMITY;  Surgeon: Lorretta Harp, MD;  Location: [place] [facility];  Service: Cardiovascular;  Laterality: N/A;   ABDOMINAL AORTOGRAM W/LOWER EXTREMITY N/A 04/12/2022   Procedure: ABDOMINAL AORTOGRAM W/LOWER EXTREMITY;  Surgeon: Lorretta Harp, MD;  Location: [place] [facility];  Service: Cardiovascular;  Laterality: N/A;   ANTERIOR CERVICAL DECOMP/DISCECTOMY FUSION  12/19/2011   Procedure: ANTERIOR CERVICAL DECOMPRESSION/DISCECTOMY FUSION 2 LEVELS;  Surgeon: Floyce Stakes, MD;  [facility];  Service: Neurosurgery;  Laterality: N/A;  Cervical four-five Cervical five-six  Anterior cervical decompression/diskectomy, fusion, plate   ATHERECTOMY N/A 01/02/2013   Procedure: ATHERECTOMY;  Surgeon: Lorretta Harp, MD;  Location: [facility] CATH LAB;  Service: Cardiovascular;  Laterality: N/A;   BACK SURGERY     x3   CARDIAC CATHETERIZATION  08/19/2012   CABG warranted; Severe ostial LCx stenosis with mid to distal vessel occlusion   COLONOSCOPY W/ POLYPECTOMY  08/28/11   Rourk-tubular adenomas removed from ascending colon, suboptimal prep, diminutive rectal polyps   COLONOSCOPY WITH PROPOFOL N/A 06/13/2017   Procedure: COLONOSCOPY WITH PROPOFOL;  Surgeon: Daneil Dolin, MD;  [facility];  Service: Endoscopy;  Laterality: N/A;  9:15am   CORONARY ARTERY BYPASS GRAFT  08/22/2012   Procedure: CORONARY ARTERY BYPASS GRAFTING (CABG);  Surgeon: Grace Isaac, MD;  Location: [place];  Service: Open Heart Surgery;  Laterality: N/A;  CABG x four,  using left internal mammary artery and right leg greater saphenous vein harvested endoscopically   ESOPHAGOGASTRODUODENOSCOPY  05/24/01   by Dr. Gala Romney   ESOPHAGOGASTRODUODENOSCOPY  08/28/11   Rourk-erosive  reflux esophagitis, gastric erosions   ESOPHAGOGASTRODUODENOSCOPY (EGD) WITH PROPOFOL N/A 06/13/2017   Procedure: ESOPHAGOGASTRODUODENOSCOPY (EGD) WITH PROPOFOL;  Surgeon: Daneil Dolin, MD;  [facility];  Service: Endoscopy;  Laterality: N/A;   INTRAOPERATIVE TRANSESOPHAGEAL ECHOCARDIOGRAM  08/22/2012   Procedure: INTRAOPERATIVE TRANSESOPHAGEAL ECHOCARDIOGRAM;  Surgeon: Grace Isaac, MD;  Location: [place];  Service: Open Heart Surgery;  Laterality: N/A;   IR 3D INDEPENDENT WKST  05/17/2022   IR ANGIO INTRA EXTRACRAN SEL INTERNAL CAROTID UNI R MOD SED  05/17/2022   IR ANGIOGRAM FOLLOW UP STUDY  05/17/2022   IR NEURO EACH ADD'L AFTER BASIC UNI RIGHT (MS)  05/17/2022   IR TRANSCATH/EMBOLIZ  05/17/2022   IR US GUIDE VASC ACCESS RIGHT  05/17/2022   LEFT HEART CATHETERIZATION WITH CORONARY ANGIOGRAM N/A 08/19/2012   Procedure: LEFT HEART CATHETERIZATION  WITH CORONARY ANGIOGRAM;  Surgeon: [name], MD;  Location: [facility] CATH LAB;  Service: Cardiovascular;  Laterality: N/A;   LOWER EXTREMITY ANGIOGRAM N/A 11/17/2012   Procedure: LOWER EXTREMITY ANGIOGRAM;  Surgeon: Lorretta Harp, MD;  Location: [facility] CATH LAB;  Service: Cardiovascular;  Laterality: N/A;   LOWER EXTREMITY ANGIOGRAM N/A 02/02/2013   Procedure: LOWER EXTREMITY ANGIOGRAM;  Surgeon: Lorretta Harp, MD;  Location: [facility] CATH LAB;  Service: Cardiovascular;  Laterality: N/A;   LUMBAR DISC SURGERY     LUMBAR FUSION  ~ 2011   PELVIC ABCESS DRAINAGE     x2   PERIPHERAL VASCULAR ATHERECTOMY  03/30/2021   Procedure: PERIPHERAL VASCULAR ATHERECTOMY;  Surgeon: Lorretta Harp, MD;  Location: [place] [facility];  Service: Cardiovascular;;  left common iliac artery   PERIPHERAL VASCULAR BALLOON ANGIOPLASTY Left 04/12/2022   Procedure: PERIPHERAL VASCULAR BALLOON ANGIOPLASTY;  Surgeon: Lorretta Harp, MD;  Location: [place] [facility];  Service: Cardiovascular;  Laterality: Left;  SFA   PERIPHERAL VASCULAR INTERVENTION  03/30/2021    Procedure: PERIPHERAL VASCULAR INTERVENTION;  Surgeon: Lorretta Harp, MD;  Location: [place] [facility];  Service: Cardiovascular;;   POLYPECTOMY  06/13/2017   Procedure: POLYPECTOMY;  Surgeon: Daneil Dolin, MD;  [facility];  Service: Endoscopy;;  colon   RADIOLOGY WITH ANESTHESIA N/A 05/17/2022   Procedure: Diagnostic Cerebral Angiogram, Possible Aneurysm Coiling, Possible Stent;  Surgeon: Consuella Lose, MD;  Location: [place];  Service: Radiology;  Laterality: N/A;   SHOULDER SURGERY Right    VASCULAR SURGERY Right 01/02/2013   diamondback orbital rotational  atherectomy, chocolate balloon and IDEV  Stent.    Family History  Problem Relation Age of Onset   Anesthesia problems Neg Hx    Colon cancer Neg Hx    Gastric cancer Neg Hx    Esophageal cancer Neg Hx     Allergies as of 09/19/2022   (No Known Allergies)    Social History   Socioeconomic History   Marital status: Single    Spouse name: Not on file   Number of children: 1   Years of education: Not on file   Highest education level: Not on file  Occupational History   Occupation: disabled    Employer: DISABLED  Tobacco Use   Smoking status: Every Day    Packs/day: 1.00    Years: 39.00    Total pack years: 39.00    Types: Cigarettes   Smokeless tobacco: Never  Vaping Use   Vaping Use: Never used  Substance and Sexual Activity   Alcohol use: Yes    Alcohol/week: 6.0 standard drinks of alcohol    Types: 6 Cans of beer per week   Drug use: No   Sexual activity: Not Currently  Other Topics Concern   Not on file  Social History Narrative   Lives w/ girlfriend   Social Determinants of Health   Financial Resource Strain: Not on file  Food Insecurity: Not on file  Transportation Needs: Not on file  Physical Activity: Not on file  Stress: Not on file  Social Connections: Not on file  Intimate Partner Violence: Not on file     Review of Systems  *** Gen: Denies any fever, chills,  fatigue, weight loss, lack of appetite.  CV: Denies chest pain, heart palpitations, peripheral edema, syncope.  Resp: Denies shortness of breath at rest or with exertion. Denies wheezing or cough.  GI: see HPI GU : Denies urinary burning, urinary  frequency, urinary hesitancy MS: Denies joint pain, muscle weakness, cramps, or limitation of movement.  Derm: Denies rash, itching, dry skin Psych: Denies depression, anxiety, memory loss, and confusion Heme: Denies bruising, bleeding, and enlarged lymph nodes.   Physical Exam   There were no vitals taken for this visit.  General:   Alert and oriented. Pleasant and cooperative. Well-nourished and well-developed.  Head:  Normocephalic and atraumatic. Eyes:  Without icterus, sclera clear and conjunctiva pink.  Ears:  Normal auditory acuity. Mouth:  No deformity or lesions, oral mucosa pink.  Lungs:  Clear to auscultation bilaterally. No wheezes, rales, or rhonchi. No distress.  Heart:  S1, S2 present without murmurs appreciated.  Abdomen:  +BS, soft, non-tender and non-distended. No HSM noted. No guarding or rebound. No masses appreciated.  Rectal:  Deferred *** Msk:  Symmetrical without gross deformities. Normal posture. Extremities:  Without edema. Neurologic:  Alert and  oriented x4;  grossly normal neurologically. Skin:  Intact without significant lesions or rashes. Psych:  Alert and cooperative. Normal mood and affect.   Assessment   [name] is a 62 y.o. male with a history of PAD s/p multiple lower extremity angiography and drug-coated balloon angioplasty to left SFA, right MCA aneurysm s/p coil embolization in October 2023, CAD s/p CABG x 4 in 2014, GERD, HLD, HTN, type 2 diabetes, COPD*** presenting today for evaluation prior to scheduling surveillance colonoscopy.  History of colon polyps:    PLAN   *** Proceed with colonoscopy with propofol by Dr. Gala Romney in near future: the risks, benefits, and alternatives have been  discussed with the patient in detail. The patient states understanding and desires to proceed. ASA 3/4 Half normal dose of lantus night prior Hold Jardiance for 3 days Clearance from cardiology     Venetia Night, MSN, FNP-BC, AGACNP-BC [facility]

## 2022-09-19 ENCOUNTER — Ambulatory Visit (INDEPENDENT_AMBULATORY_CARE_PROVIDER_SITE_OTHER): Payer: 59 | Admitting: Gastroenterology

## 2022-09-19 ENCOUNTER — Encounter: Payer: Self-pay | Admitting: Gastroenterology

## 2022-09-19 ENCOUNTER — Telehealth: Payer: Self-pay | Admitting: *Deleted

## 2022-09-19 ENCOUNTER — Encounter: Payer: Self-pay | Admitting: *Deleted

## 2022-09-19 ENCOUNTER — Other Ambulatory Visit: Payer: Self-pay | Admitting: *Deleted

## 2022-09-19 VITALS — BP 153/87 | HR 80 | Temp 97.4°F | Ht 71.0 in | Wt 190.2 lb

## 2022-09-19 DIAGNOSIS — Z86010 Personal history of colonic polyps: Secondary | ICD-10-CM

## 2022-09-19 MED ORDER — NA SULFATE-K SULFATE-MG SULF 17.5-3.13-1.6 GM/177ML PO SOLN: ORAL | 0 refills | Status: DC

## 2022-09-19 NOTE — Telephone Encounter (Signed)
[employer] PA: APPROVED Authorization #: [id number]  DOS: 10/17/22-01/15/23

## 2022-09-19 NOTE — Patient Instructions (Addendum)
We are scheduling you for a colonoscopy in the near future with Dr. Gala Romney.  You received separate detailed written instructions regarding your medication adjustments.  In summary: Half normal dose of lantus night prior to procedure No humalog or lantus morning of procedure.  Hold Jardiance for 3 days  We will reach out to you to schedule once we have received clearance from cardiology.  It was a pleasure to see you today. I want to create trusting relationships with patients. If you receive a survey regarding your visit,  I greatly appreciate you taking time to fill this out on paper or through your MyChart. I value your feedback.  Venetia Night, MSN, FNP-BC, AGACNP-BC [facility]

## 2022-09-20 ENCOUNTER — Encounter: Payer: Self-pay | Admitting: *Deleted

## 2022-09-26 ENCOUNTER — Other Ambulatory Visit: Payer: Self-pay | Admitting: Internal Medicine

## 2022-09-26 ENCOUNTER — Telehealth: Payer: Self-pay | Admitting: *Deleted

## 2022-09-26 NOTE — Telephone Encounter (Signed)
Pt called to cancel his procedure on 10/17/22 because he will be having hip surgery. He says he will call back to reschedule once he is feeling up to it.

## 2022-10-02 DIAGNOSIS — Z794 Long term (current) use of insulin: Secondary | ICD-10-CM | POA: Diagnosis not present

## 2022-10-02 DIAGNOSIS — E1165 Type 2 diabetes mellitus with hyperglycemia: Secondary | ICD-10-CM | POA: Diagnosis not present

## 2022-10-02 DIAGNOSIS — E1143 Type 2 diabetes mellitus with diabetic autonomic (poly)neuropathy: Secondary | ICD-10-CM | POA: Diagnosis not present

## 2022-10-02 DIAGNOSIS — Z79899 Other long term (current) drug therapy: Secondary | ICD-10-CM | POA: Diagnosis not present

## 2022-10-02 DIAGNOSIS — I739 Peripheral vascular disease, unspecified: Secondary | ICD-10-CM | POA: Diagnosis not present

## 2022-10-02 DIAGNOSIS — M48062 Spinal stenosis, lumbar region with neurogenic claudication: Secondary | ICD-10-CM | POA: Diagnosis not present

## 2022-10-02 DIAGNOSIS — E1100 Type 2 diabetes mellitus with hyperosmolarity without nonketotic hyperglycemic-hyperosmolar coma (NKHHC): Secondary | ICD-10-CM | POA: Diagnosis not present

## 2022-10-02 DIAGNOSIS — I25118 Atherosclerotic heart disease of native coronary artery with other forms of angina pectoris: Secondary | ICD-10-CM | POA: Diagnosis not present

## 2022-10-09 NOTE — Care Plan (Signed)
Ortho Bundle Case Management Note  Patient Details  Name: [name] MRN: [id number] Date of Birth: 1961-07-31    Spoke with patient prior to surgery. Will discharge to home with family to assist. Rolling walker ordered for home use. OPPT set up with [facility]. Discharge instructions discussed. Appointments confirmed. Patient and MD in agreement with plan. Choice offered                  DME Arranged:  Walker rolling DME Agency:  [employer]  HH Arranged:    [place] Agency:     Additional Comments: Please contact me with any questions of if this plan should need to change.  Ladell Heads,  [place] [facility]  [phone] 10/09/2022, 2:30 PM

## 2022-10-10 DIAGNOSIS — M1611 Unilateral primary osteoarthritis, right hip: Secondary | ICD-10-CM | POA: Diagnosis not present

## 2022-10-12 NOTE — Patient Instructions (Addendum)
DUE TO COVID-19 ONLY TWO VISITORS  (aged 62 and older)  ARE ALLOWED TO COME WITH YOU AND STAY IN THE WAITING ROOM ONLY DURING PRE OP AND PROCEDURE.   **NO VISITORS ARE ALLOWED IN THE SHORT STAY AREA OR RECOVERY ROOM!!**  IF YOU WILL BE ADMITTED INTO THE HOSPITAL YOU ARE ALLOWED ONLY FOUR SUPPORT PEOPLE DURING VISITATION HOURS ONLY (7 AM -8PM)   The support person(s) must pass our screening, gel in and out, and wear a mask at all times, including in the patient's room. Patients must also wear a mask when staff or their support person are in the room. Visitors GUEST BADGE MUST BE WORN VISIBLY  One adult visitor may remain with you overnight and MUST be in the room by 8 P.M.     Your procedure is scheduled on: 10/23/22   Report to [facility] Main Entrance    Report to admitting at : 11:00 AM   Call this number if you have problems the morning of surgery [phone]   Do not eat food :After Midnight.   After Midnight you may have the following liquids until: 10:00 AM DAY OF SURGERY  Water Black Coffee (sugar ok, NO MILK/CREAM OR CREAMERS)  Tea (sugar ok, NO MILK/CREAM OR CREAMERS) regular and decaf                             Plain Jell-O (NO RED)                                           Fruit ices (not with fruit pulp, NO RED)                                     Popsicles (NO RED)                                                                  Juice: apple, WHITE grape, WHITE cranberry Sports drinks like Gatorade (NO RED)     The day of surgery:  Drink ONE (1) Pre-Surgery Clear G2 at: 10:00 AM the morning of surgery. Drink in one sitting. Do not sip.  This drink was given to you during your hospital  pre-op appointment visit. Nothing else to drink after completing the  Pre-Surgery Clear Ensure or G2.          If you have questions, please contact your surgeon's office.  Oral Hygiene is also important to reduce your risk of infection.                                     Remember - BRUSH YOUR TEETH THE MORNING OF SURGERY WITH YOUR REGULAR TOOTHPASTE  DENTURES WILL BE REMOVED PRIOR TO SURGERY PLEASE DO NOT APPLY "Poly grip" OR ADHESIVES!!!   Do NOT smoke after Midnight   Take these medicines the morning of surgery with A SIP OF WATER: isosorbide,metoprolol.Use inhalers and eye drops as usual.  How  to Manage Your Diabetes Before and After Surgery  Why is it important to control my blood sugar before and after surgery? Improving blood sugar levels before and after surgery helps healing and can limit problems. A way of improving blood sugar control is eating a healthy diet by:  Eating less sugar and carbohydrates  Increasing activity/exercise  Talking with your doctor about reaching your blood sugar goals High blood sugars (greater than 180 mg/dL) can raise your risk of infections and slow your recovery, so you will need to focus on controlling your diabetes during the weeks before surgery. Make sure that the doctor who takes care of your diabetes knows about your planned surgery including the date and location.  How do I manage my blood sugar before surgery? Check your blood sugar at least 4 times a day, starting 2 days before surgery, to make sure that the level is not too high or low. Check your blood sugar the morning of your surgery when you wake up and every 2 hours until you get to the Short Stay unit. If your blood sugar is less than 70 mg/dL, you will need to treat for low blood sugar: Do not take insulin. Treat a low blood sugar (less than 70 mg/dL) with  cup of clear juice (cranberry or apple), 4 glucose tablets, OR glucose gel. Recheck blood sugar in 15 minutes after treatment (to make sure it is greater than 70 mg/dL). If your blood sugar is not greater than 70 mg/dL on recheck, call [phone] for further instructions. Report your blood sugar to the short stay nurse when you get to Short Stay.  If you are admitted to the hospital after  surgery: Your blood sugar will be checked by the staff and you will probably be given insulin after surgery (instead of oral diabetes medicines) to make sure you have good blood sugar levels. The goal for blood sugar control after surgery is 80-180 mg/dL.   WHAT DO I DO ABOUT MY DIABETES MEDICATION?   HOLD Jardiance 72 hours before surgery. Last dose 10/19/22  THE NIGHT BEFORE SURGERY, take ONLY half of the lantus insulin dose (25 units). Take ONLY 70% of the humalog 75/25  insulin.( 35 units)     THE MORNING OF SURGERY, take ONLY half of the lantus  insulin dose (25 units). DO NOT take humalog 75/25 insulin. DO NOT TAKE ANY ORAL DIABETIC MEDICATIONS DAY OF YOUR SURGERY  DO NOT TAKE THE FOLLOWING 7 DAYS PRIOR TO SURGERY: Ozempic, Wegovy, Rybelsus (Semaglutide), Byetta (exenatide), Bydureon (exenatide ER), Victoza, Saxenda (liraglutide), or Trulicity (dulaglutide) Mounjaro (Tirzepatide) Adlyxin (Lixisenatide), Polyethylene Glycol Loxenatide.  If your CBG is greater than 220 mg/dL, you may take  of your sliding scale  (correction) dose of insulin.                              You may not have any metal on your body including hair pins, jewelry, and body piercing             Do not wear lotions, powders, perfumes/cologne, or deodorant              Men may shave face and neck.   Do not bring valuables to the hospital. [place].   Contacts, glasses, or bridgework may not be worn into surgery.   Bring  small overnight bag day of surgery.   DO NOT [place]. PHARMACY WILL DISPENSE MEDICATIONS LISTED ON YOUR MEDICATION LIST TO YOU DURING YOUR ADMISSION [place]!    Patients discharged on the day of surgery will not be allowed to drive home.  Someone NEEDS to stay with you for the first 24 hours after anesthesia.   Special Instructions: Bring a copy of your healthcare power of attorney and living will  documents         the day of surgery if you haven't scanned them before.              Please read over the following fact sheets you were given: IF YOU HAVE QUESTIONS ABOUT YOUR PRE-OP INSTRUCTIONS PLEASE CALL [phone]    [facility] - Preparing for Surgery Before surgery, you can play an important role.  Because skin is not sterile, your skin needs to be as free of germs as possible.  You can reduce the number of germs on your skin by washing with CHG (chlorahexidine gluconate) soap before surgery.  CHG is an antiseptic cleaner which kills germs and bonds with the skin to continue killing germs even after washing. Please DO NOT use if you have an allergy to CHG or antibacterial soaps.  If your skin becomes reddened/irritated stop using the CHG and inform your nurse when you arrive at Short Stay. Do not shave (including legs and underarms) for at least 48 hours prior to the first CHG shower.  You may shave your face/neck. Please follow these instructions carefully:  1.  Shower with CHG Soap the night before surgery and the  morning of Surgery.  2.  If you choose to wash your hair, wash your hair first as usual with your  normal  shampoo.  3.  After you shampoo, rinse your hair and body thoroughly to remove the  shampoo.                           4.  Use CHG as you would any other liquid soap.  You can apply chg directly  to the skin and wash                       Gently with a scrungie or clean washcloth.  5.  Apply the CHG Soap to your body ONLY FROM THE NECK DOWN.   Do not use on face/ open                           Wound or open sores. Avoid contact with eyes, ears mouth and genitals (private parts).                       Wash face,  Genitals (private parts) with your normal soap.             6.  Wash thoroughly, paying special attention to the area where your surgery  will be performed.  7.  Thoroughly rinse your body with warm water from the neck down.  8.  DO NOT shower/wash with your  normal soap after using and rinsing off  the CHG Soap.                9.  Pat yourself dry with a clean towel.  10.  Wear clean pajamas.            11.  Place clean sheets on your bed the night of your first shower and do not  sleep with pets. Day of Surgery : Do not apply any lotions/deodorants the morning of surgery.  Please wear clean clothes to the hospital/surgery center.  FAILURE TO FOLLOW THESE INSTRUCTIONS MAY RESULT IN THE CANCELLATION OF YOUR SURGERY PATIENT SIGNATURE_________________________________  NURSE SIGNATURE__________________________________  ________________________________________________________________________  Adam Phenix  An incentive spirometer is a tool that can help keep your lungs clear and active. This tool measures how well you are filling your lungs with each breath. Taking long deep breaths may help reverse or decrease the chance of developing breathing (pulmonary) problems (especially infection) following: A long period of time when you are unable to move or be active. BEFORE THE PROCEDURE  If the spirometer includes an indicator to show your best effort, your nurse or respiratory therapist will set it to a desired goal. If possible, sit up straight or lean slightly forward. Try not to slouch. Hold the incentive spirometer in an upright position. INSTRUCTIONS FOR USE  Sit on the edge of your bed if possible, or sit up as far as you can in bed or on a chair. Hold the incentive spirometer in an upright position. Breathe out normally. Place the mouthpiece in your mouth and seal your lips tightly around it. Breathe in slowly and as deeply as possible, raising the piston or the ball toward the top of the column. Hold your breath for 3-5 seconds or for as long as possible. Allow the piston or ball to fall to the bottom of the column. Remove the mouthpiece from your mouth and breathe out normally. Rest for a few seconds and repeat Steps 1  through 7 at least 10 times every 1-2 hours when you are awake. Take your time and take a few normal breaths between deep breaths. The spirometer may include an indicator to show your best effort. Use the indicator as a goal to work toward during each repetition. After each set of 10 deep breaths, practice coughing to be sure your lungs are clear. If you have an incision (the cut made at the time of surgery), support your incision when coughing by placing a pillow or rolled up towels firmly against it. Once you are able to get out of bed, walk around indoors and cough well. You may stop using the incentive spirometer when instructed by your caregiver.  RISKS AND COMPLICATIONS Take your time so you do not get dizzy or light-headed. If you are in pain, you may need to take or ask for pain medication before doing incentive spirometry. It is harder to take a deep breath if you are having pain. AFTER USE Rest and breathe slowly and easily. It can be helpful to keep track of a log of your progress. Your caregiver can provide you with a simple table to help with this. If you are using the spirometer at home, follow these instructions: [place] IF:  You are having difficultly using the spirometer. You have trouble using the spirometer as often as instructed. Your pain medication is not giving enough relief while using the spirometer. You develop fever of 100.5 F (38.1 C) or higher. SEEK IMMEDIATE MEDICAL CARE IF:  You cough up bloody sputum that had not been present before. You develop fever of 102 F (38.9 C) or greater. You develop worsening pain at or near the  incision site. MAKE SURE YOU:  Understand these instructions. Will watch your condition. Will get help right away if you are not doing well or get worse. Document Released: 12/03/2006 Document Revised: 10/15/2011 Document Reviewed: 02/03/2007 [facility] Patient Information 2014 [place],  [place].   ________________________________________________________________________

## 2022-10-15 ENCOUNTER — Encounter (HOSPITAL_COMMUNITY)
Admission: RE | Admit: 2022-10-15 | Discharge: 2022-10-15 | Disposition: A | Discharge status: Home / Self Care | Payer: 59 | Source: Ambulatory Visit | Attending: Orthopedic Surgery | Admitting: Orthopedic Surgery

## 2022-10-15 ENCOUNTER — Encounter (HOSPITAL_COMMUNITY): Payer: Self-pay

## 2022-10-15 ENCOUNTER — Other Ambulatory Visit: Payer: Self-pay

## 2022-10-15 ENCOUNTER — Other Ambulatory Visit (HOSPITAL_COMMUNITY): Payer: 59

## 2022-10-15 VITALS — BP 153/86 | HR 62 | Temp 97.7°F | Ht 71.0 in | Wt 190.0 lb

## 2022-10-15 DIAGNOSIS — Z01812 Encounter for preprocedural laboratory examination: Secondary | ICD-10-CM | POA: Insufficient documentation

## 2022-10-15 DIAGNOSIS — Z951 Presence of aortocoronary bypass graft: Secondary | ICD-10-CM | POA: Insufficient documentation

## 2022-10-15 DIAGNOSIS — I2510 Atherosclerotic heart disease of native coronary artery without angina pectoris: Secondary | ICD-10-CM | POA: Insufficient documentation

## 2022-10-15 DIAGNOSIS — Z7982 Long term (current) use of aspirin: Secondary | ICD-10-CM | POA: Insufficient documentation

## 2022-10-15 DIAGNOSIS — Z86010 Personal history of colonic polyps: Secondary | ICD-10-CM | POA: Diagnosis not present

## 2022-10-15 DIAGNOSIS — Z01818 Encounter for other preprocedural examination: Secondary | ICD-10-CM

## 2022-10-15 DIAGNOSIS — E785 Hyperlipidemia, unspecified: Secondary | ICD-10-CM | POA: Diagnosis not present

## 2022-10-15 DIAGNOSIS — Z794 Long term (current) use of insulin: Secondary | ICD-10-CM

## 2022-10-15 DIAGNOSIS — I10 Essential (primary) hypertension: Secondary | ICD-10-CM | POA: Diagnosis not present

## 2022-10-15 DIAGNOSIS — K219 Gastro-esophageal reflux disease without esophagitis: Secondary | ICD-10-CM | POA: Insufficient documentation

## 2022-10-15 DIAGNOSIS — Z8719 Personal history of other diseases of the digestive system: Secondary | ICD-10-CM | POA: Insufficient documentation

## 2022-10-15 DIAGNOSIS — E1151 Type 2 diabetes mellitus with diabetic peripheral angiopathy without gangrene: Secondary | ICD-10-CM | POA: Diagnosis not present

## 2022-10-15 DIAGNOSIS — Z7984 Long term (current) use of oral hypoglycemic drugs: Secondary | ICD-10-CM | POA: Diagnosis not present

## 2022-10-15 DIAGNOSIS — Z79899 Other long term (current) drug therapy: Secondary | ICD-10-CM | POA: Diagnosis not present

## 2022-10-15 DIAGNOSIS — M1611 Unilateral primary osteoarthritis, right hip: Secondary | ICD-10-CM | POA: Insufficient documentation

## 2022-10-15 DIAGNOSIS — Z7902 Long term (current) use of antithrombotics/antiplatelets: Secondary | ICD-10-CM | POA: Insufficient documentation

## 2022-10-15 DIAGNOSIS — J449 Chronic obstructive pulmonary disease, unspecified: Secondary | ICD-10-CM | POA: Insufficient documentation

## 2022-10-15 LAB — GLUCOSE, CAPILLARY: Glucose-Capillary: 179 mg/dL — ABNORMAL HIGH (ref 70–99)

## 2022-10-15 LAB — CBC
HCT: 51.8 % (ref 39.0–52.0)
Hemoglobin: 16.7 g/dL (ref 13.0–17.0)
MCH: 30.3 pg (ref 26.0–34.0)
MCHC: 32.2 g/dL (ref 30.0–36.0)
MCV: 93.8 fL (ref 80.0–100.0)
Platelets: 160 10*3/uL (ref 150–400)
RBC: 5.52 MIL/uL (ref 4.22–5.81)
RDW: 12.3 % (ref 11.5–15.5)
WBC: 9 10*3/uL (ref 4.0–10.5)
nRBC: 0 % (ref 0.0–0.2)

## 2022-10-15 LAB — SURGICAL PCR SCREEN
MRSA, PCR: NEGATIVE
Staphylococcus aureus: NEGATIVE

## 2022-10-15 LAB — BASIC METABOLIC PANEL
Anion gap: 6 (ref 5–15)
BUN: 9 mg/dL (ref 8–23)
CO2: 27 mmol/L (ref 22–32)
Calcium: 8.8 mg/dL — ABNORMAL LOW (ref 8.9–10.3)
Chloride: 103 mmol/L (ref 98–111)
Creatinine, Ser: 0.94 mg/dL (ref 0.61–1.24)
GFR, Estimated: >60 mL/min (ref >60)
Glucose, Bld: 161 mg/dL — ABNORMAL HIGH (ref 70–99)
Potassium: 4.2 mmol/L (ref 3.5–5.1)
Sodium: 136 mmol/L (ref 135–145)

## 2022-10-15 NOTE — Progress Notes (Addendum)
For Short Stay: [place] appointment date:  Bowel Prep reminder:   For Anesthesia: PCP - Pablo Lawrence : NP Cardiologist - Dr. Lyman Bishop Clearance: Diona Browner: NP: 09/12/22 Neurologist: Dr. Lynford Humphrey. Chest x-ray - CT chest: 08/15/22 EKG - 02/27/22 Stress Test -  ECHO - 2020 Cardiac Cath - 04/12/22 Pacemaker/ICD device last checked: Pacemaker orders received: Device Rep notified:  Spinal Cord Stimulator:  Sleep Study - N/A CPAP -   Fasting Blood Sugar - Unknow Checks Blood Sugar __0___ times a day Date and result of last Hgb A1c- 9.7 : 10/02/22  Last dose of GLP1 agonist-  GLP1 instructions:   Last dose of SGLT-2 inhibitors- Jardiance: will be held on: 10/19/22 SGLT-2 instructions:   Blood Thinner Instructions: Plavix: will be held 5 days before. Aspirin Instructions: will be continue. Last Dose:  Activity level: Can go up a flight of stairs and activities of daily living without stopping and without chest pain and/or shortness of breath   Able to exercise without chest pain and/or shortness of breath  Anesthesia review: Hx: PAD,CABG x 4,HTN,DIA,COPD,CAD.  Patient denies shortness of breath, fever, cough and chest pain at PAT appointment   Patient verbalized understanding of instructions that were given to them at the PAT appointment. Patient was also instructed that they will need to review over the PAT instructions again at home before surgery.

## 2022-10-15 NOTE — Progress Notes (Signed)
   10/15/22 0911  OBSTRUCTIVE SLEEP APNEA  Have you ever been diagnosed with sleep apnea through a sleep study? No  Do you snore loudly (loud enough to be heard through closed doors)?  1  Do you often feel tired, fatigued, or sleepy during the daytime (such as falling asleep during driving or talking to someone)? 0  Has anyone observed you stop breathing during your sleep? 1  Do you have, or are you being treated for high blood pressure? 1  BMI more than 35 kg/m2? 0  Age > 50 (1-yes) 1  Neck circumference greater than:Male 16 inches or larger, Male 17inches or larger? 0  Male Gender (Yes=1) 1  Obstructive Sleep Apnea Score 5  Score 5 or greater  Results sent to PCP

## 2022-10-16 ENCOUNTER — Encounter (HOSPITAL_COMMUNITY): Payer: Self-pay | Admitting: Physician Assistant

## 2022-10-16 NOTE — Progress Notes (Signed)
Anesthesia Chart Review   Case: [id number] Date/Time: 10/23/22 1313   Procedure: TOTAL HIP ARTHROPLASTY (Right: Hip)   Anesthesia type: Choice   Pre-op diagnosis: djd right hip   Location: [place] [facility]   Surgeons: Marchia Bond, MD       DISCUSSION:62 y.o. smoker with h/o HTN, DM II, COPD, CAD (CABG 2014), PAD s/p stent to mid right SFA, PTA/DCB-left SFA in 04/2022, cerebral aneurysm s/p coil embolization in 05/2022, right hip djd scheduled for above procedure 10/23/2022 with Dr. Marchia Bond.   Per cardiology preoperative evaluation 09/12/2022, "According to the Revised Cardiac Risk Index (RCRI), his Perioperative Risk of Major Cardiac Event is (%): 11. His Functional Capacity in METs is: 4.73 according to the Duke Activity Status Index (DASI).Therefore, based on ACC/AHA guidelines, patient would be at acceptable risk for the planned procedure without further cardiovascular testing.    The patient was advised that if he develops new symptoms prior to surgery to contact our office to arrange for a follow-up visit, and he verbalized understanding.   Per Dr. [name], from a cardiac standpoint, he may hold Plavix for 5 days prior to procedure. Please resume Plavix as soon as possible postprocedure, at the discretion of the surgeon. Regarding ASA therapy, we recommend continuation of ASA throughout the perioperative period.  However, if the surgeon feels that cessation of ASA is required in the perioperative period, it may be stopped 5-7 days prior to surgery with a plan to resume it as soon as felt to be feasible from a surgical standpoint in the post-operative period.  However, patient is s/p cerebral aneurysm, coil embolization in 05/2022.  Therefore, recommendations for holding aspirin and Plavix prior to surgery should also come from neurosurgery."  A1C 9.7  Voicemail left with Dr. Luanna Cole office regarding A1C and need for clearance from neurosurg regarding Plavix.  VS: BP (!) 153/86    Pulse 62   Temp 36.5 C (Oral)   Ht '5\' 11"'$  (1.803 m)   Wt 86.2 kg   SpO2 97%   BMI 26.50 kg/m   PROVIDERS: Pablo Lawrence, NP  Cardiologist - Dr. Lyman Bishop  LABS: Labs reviewed: Acceptable for surgery. (all labs ordered are listed, but only abnormal results are displayed)  Labs Reviewed  BASIC METABOLIC PANEL - Abnormal; Notable for the following components:      Result Value   Glucose, Bld 161 (*)    Calcium 8.8 (*)    All other components within normal limits  GLUCOSE, CAPILLARY - Abnormal; Notable for the following components:   Glucose-Capillary 179 (*)    All other components within normal limits  SURGICAL PCR SCREEN  CBC     IMAGES:   EKG:   CV: Myocardial Perfusion 09/10/2018 No diagnostic ST segment changes to indicate ischemia. Very small, mild intensity, apical inferior and anterior defects that are partially reversible and most consistent with variable soft tissue attenuation rather than ischemia. No large ischemic territories are noted. This is a low risk study. Nuclear stress EF: 70%.  Echo 09/10/2018 1. The left ventricle has normal systolic function of 123456. The cavity  size is normal. There is borderline increase in left ventricular wall  thickness. Echo evidence of normal diastolic relaxation.   2. The right ventricle has normal systolic function. The cavity in normal  in size. There is no increase in right ventricular wall thickness. Right  ventricular systolic pressure could not be assessed.   3. The aortic valve is tricuspid. There is  mild aortic annular  calcification noted.   4. The mitral valve is normal in structure.   5. The pulmonic valve is grossly normal.  Past Medical History:  Diagnosis Date   Arthritis    "some; in hips sometimes" (01/02/2013)   Back pain    "w/prolonged walking or standing" (01/02/2013)   CAD (coronary artery disease)    CABG X 4 08/2012   COPD (chronic obstructive pulmonary disease) (HCC)    Elevated PSA     Esophageal erosions 05/24/2001   egd by Dr. Gala Romney   GERD (gastroesophageal reflux disease)    H/O hiatal hernia    Headache    History of gout    Hyperlipidemia    Hypertension    not on any medication for htn at present   PAD (peripheral artery disease) ([place])    LEA DOPPLER, 11/11/2012 - moderate arterial insufficiency to lower extremities at rest, BILATERAL SFA-demonstrates occlusive disease with reconstitution of flow   S/P percutaneous transluminal angioplasty (PTA) with stent placement 03/29/21 03/31/2021   Tobacco abuse    Tubular adenoma of colon 08/28/2011   Type II diabetes mellitus ([place])    Unintentional weight loss     Past Surgical History:  Procedure Laterality Date   ABDOMINAL AORTOGRAM W/LOWER EXTREMITY N/A 03/30/2021   Procedure: ABDOMINAL AORTOGRAM W/LOWER EXTREMITY;  Surgeon: Lorretta Harp, MD;  Location: [place] [facility];  Service: Cardiovascular;  Laterality: N/A;   ABDOMINAL AORTOGRAM W/LOWER EXTREMITY N/A 04/12/2022   Procedure: ABDOMINAL AORTOGRAM W/LOWER EXTREMITY;  Surgeon: Lorretta Harp, MD;  Location: [place] [facility];  Service: Cardiovascular;  Laterality: N/A;   ANTERIOR CERVICAL DECOMP/DISCECTOMY FUSION  12/19/2011   Procedure: ANTERIOR CERVICAL DECOMPRESSION/DISCECTOMY FUSION 2 LEVELS;  Surgeon: Floyce Stakes, MD;  [facility]: [facility];  Service: Neurosurgery;  Laterality: N/A;  Cervical four-five Cervical five-six  Anterior cervical decompression/diskectomy, fusion, plate   ATHERECTOMY N/A 01/02/2013   Procedure: ATHERECTOMY;  Surgeon: Lorretta Harp, MD;  Location: [facility] CATH LAB;  Service: Cardiovascular;  Laterality: N/A;   BACK SURGERY     x3   CARDIAC CATHETERIZATION  08/19/2012   CABG warranted; Severe ostial LCx stenosis with mid to distal vessel occlusion   COLONOSCOPY W/ POLYPECTOMY  08/28/11   Rourk-tubular adenomas removed from ascending colon, suboptimal prep, diminutive rectal polyps   COLONOSCOPY WITH PROPOFOL N/A 06/13/2017    Procedure: COLONOSCOPY WITH PROPOFOL;  Surgeon: Daneil Dolin, MD;  [facility];  Service: Endoscopy;  Laterality: N/A;  9:15am   CORONARY ARTERY BYPASS GRAFT  08/22/2012   Procedure: CORONARY ARTERY BYPASS GRAFTING (CABG);  Surgeon: Grace Isaac, MD;  Location: [place];  Service: Open Heart Surgery;  Laterality: N/A;  CABG x four,  using left internal mammary artery and right leg greater saphenous vein harvested endoscopically   ESOPHAGOGASTRODUODENOSCOPY  05/24/01   by Dr. Gala Romney   ESOPHAGOGASTRODUODENOSCOPY  08/28/11   Rourk-erosive reflux esophagitis, gastric erosions   ESOPHAGOGASTRODUODENOSCOPY (EGD) WITH PROPOFOL N/A 06/13/2017   Procedure: ESOPHAGOGASTRODUODENOSCOPY (EGD) WITH PROPOFOL;  Surgeon: Daneil Dolin, MD;  [facility];  Service: Endoscopy;  Laterality: N/A;   INTRAOPERATIVE TRANSESOPHAGEAL ECHOCARDIOGRAM  08/22/2012   Procedure: INTRAOPERATIVE TRANSESOPHAGEAL ECHOCARDIOGRAM;  Surgeon: Grace Isaac, MD;  Location: [place];  Service: Open Heart Surgery;  Laterality: N/A;   IR 3D INDEPENDENT WKST  05/17/2022   IR ANGIO INTRA EXTRACRAN SEL INTERNAL CAROTID UNI R MOD SED  05/17/2022   IR ANGIOGRAM FOLLOW UP STUDY  05/17/2022   IR NEURO EACH ADD'L AFTER BASIC UNI RIGHT (MS)  05/17/2022   IR TRANSCATH/EMBOLIZ  05/17/2022   IR US GUIDE VASC ACCESS RIGHT  05/17/2022   LEFT HEART CATHETERIZATION WITH CORONARY ANGIOGRAM N/A 08/19/2012   Procedure: LEFT HEART CATHETERIZATION WITH CORONARY ANGIOGRAM;  Surgeon: [name], MD;  Location: [facility] CATH LAB;  Service: Cardiovascular;  Laterality: N/A;   LOWER EXTREMITY ANGIOGRAM N/A 11/17/2012   Procedure: LOWER EXTREMITY ANGIOGRAM;  Surgeon: Lorretta Harp, MD;  Location: [facility] CATH LAB;  Service: Cardiovascular;  Laterality: N/A;   LOWER EXTREMITY ANGIOGRAM N/A 02/02/2013   Procedure: LOWER EXTREMITY ANGIOGRAM;  Surgeon: Lorretta Harp, MD;  Location: [facility] CATH LAB;  Service: Cardiovascular;  Laterality: N/A;    LUMBAR DISC SURGERY     LUMBAR FUSION  ~ 2011   PELVIC ABCESS DRAINAGE     x2   PERIPHERAL VASCULAR ATHERECTOMY  03/30/2021   Procedure: PERIPHERAL VASCULAR ATHERECTOMY;  Surgeon: Lorretta Harp, MD;  Location: [place] [facility];  Service: Cardiovascular;;  left common iliac artery   PERIPHERAL VASCULAR BALLOON ANGIOPLASTY Left 04/12/2022   Procedure: PERIPHERAL VASCULAR BALLOON ANGIOPLASTY;  Surgeon: Lorretta Harp, MD;  Location: [place] [facility];  Service: Cardiovascular;  Laterality: Left;  SFA   PERIPHERAL VASCULAR INTERVENTION  03/30/2021   Procedure: PERIPHERAL VASCULAR INTERVENTION;  Surgeon: Lorretta Harp, MD;  Location: [place] [facility];  Service: Cardiovascular;;   POLYPECTOMY  06/13/2017   Procedure: POLYPECTOMY;  Surgeon: Daneil Dolin, MD;  [facility];  Service: Endoscopy;;  colon   RADIOLOGY WITH ANESTHESIA N/A 05/17/2022   Procedure: Diagnostic Cerebral Angiogram, Possible Aneurysm Coiling, Possible Stent;  Surgeon: Consuella Lose, MD;  Location: [place];  Service: Radiology;  Laterality: N/A;   SHOULDER SURGERY Right    VASCULAR SURGERY Right 01/02/2013   diamondback orbital rotational  atherectomy, chocolate balloon and IDEV  Stent.    MEDICATIONS:  acetaminophen (TYLENOL) 650 MG CR tablet   aspirin EC 81 MG EC tablet   Blood Glucose Monitoring Suppl (ONETOUCH VERIO FLEX SYSTEM) w/Device KIT   carboxymethylcellulose (REFRESH PLUS) 0.5 % SOLN   clopidogrel (PLAVIX) 75 MG tablet   cyclobenzaprine (FLEXERIL) 10 MG tablet   empagliflozin (JARDIANCE) 25 MG TABS tablet   gabapentin (NEURONTIN) 300 MG capsule   HUMALOG MIX 75/25 KWIKPEN (75-25) 100 UNIT/ML Kwikpen   isosorbide mononitrate (IMDUR) 30 MG 24 hr tablet   Lancets (ONETOUCH DELICA PLUS 123XX123) MISC   Lancets (ONETOUCH DELICA PLUS 123XX123) MISC   LANTUS SOLOSTAR 100 UNIT/ML Solostar Pen   losartan (COZAAR) 50 MG tablet   Loteprednol Etabonate (EYSUVIS) 0.25 % SUSP   metoprolol  succinate (TOPROL-XL) 25 MG 24 hr tablet   Na Sulfate-K Sulfate-Mg Sulf 17.5-3.13-1.6 GM/177ML SOLN   ONETOUCH VERIO test strip   Oxycodone HCl 10 MG TABS   oxyCODONE-acetaminophen (PERCOCET) 10-325 MG tablet   PROAIR HFA 108 (90 BASE) MCG/ACT inhaler   rosuvastatin (CRESTOR) 40 MG tablet   zolpidem (AMBIEN CR) 12.5 MG CR tablet   No current facility-administered medications for this encounter.    Konrad Felix Ward, PA-C [facility] [phone]

## 2022-10-17 ENCOUNTER — Encounter (HOSPITAL_COMMUNITY): Payer: Self-pay

## 2022-10-17 ENCOUNTER — Ambulatory Visit (HOSPITAL_COMMUNITY): Admit: 2022-10-17 | Payer: 59 | Admitting: Internal Medicine

## 2022-10-17 SURGERY — COLONOSCOPY WITH PROPOFOL
Anesthesia: Monitor Anesthesia Care

## 2022-10-17 NOTE — H&P (Signed)
HIP ARTHROPLASTY ADMISSION H&P  Patient ID: [name] MRN: [id number] DOB/AGE: [birth year] 62 y.o.  Chief Complaint: right hip pain.  Planned Procedure Date: 10/23/22 Medical Clearance by Lanell Matar, NP Cardiac Clearance by Diona Browner, NP  HPI: [name] is a 62 y.o. male who presents for evaluation of djd right hip. The patient has a history of pain and functional disability in the right hip due to arthritis and has failed non-surgical conservative treatments for greater than 12 weeks to include NSAID's and/or analgesics, corticosteriod injections, and activity modification.  Onset of symptoms was gradual, starting 3 years ago with gradually worsening course since that time. The patient noted no past surgery on the right hip.  Patient currently rates pain at 6 out of 10 with activity. Patient has worsening of pain with activity and weight bearing and pain that interferes with activities of daily living.  Patient has evidence of joint space narrowing by imaging studies.  There is no active infection.  Past Medical History:  Diagnosis Date   Arthritis    "some; in hips sometimes" (01/02/2013)   Back pain    "w/prolonged walking or standing" (01/02/2013)   CAD (coronary artery disease)    CABG X 4 08/2012   COPD (chronic obstructive pulmonary disease) (HCC)    Elevated PSA    Esophageal erosions 05/24/2001   egd by Dr. Gala Romney   GERD (gastroesophageal reflux disease)    H/O hiatal hernia    Headache    History of gout    Hyperlipidemia    Hypertension    not on any medication for htn at present   PAD (peripheral artery disease) ([place])    LEA DOPPLER, 11/11/2012 - moderate arterial insufficiency to lower extremities at rest, BILATERAL SFA-demonstrates occlusive disease with reconstitution of flow   S/P percutaneous transluminal angioplasty (PTA) with stent placement 03/29/21 03/31/2021   Tobacco abuse    Tubular adenoma of colon 08/28/2011   Type II diabetes mellitus ([place])     Unintentional weight loss    Past Surgical History:  Procedure Laterality Date   ABDOMINAL AORTOGRAM W/LOWER EXTREMITY N/A 03/30/2021   Procedure: ABDOMINAL AORTOGRAM W/LOWER EXTREMITY;  Surgeon: Lorretta Harp, MD;  Location: [place] [facility];  Service: Cardiovascular;  Laterality: N/A;   ABDOMINAL AORTOGRAM W/LOWER EXTREMITY N/A 04/12/2022   Procedure: ABDOMINAL AORTOGRAM W/LOWER EXTREMITY;  Surgeon: Lorretta Harp, MD;  Location: [place] [facility];  Service: Cardiovascular;  Laterality: N/A;   ANTERIOR CERVICAL DECOMP/DISCECTOMY FUSION  12/19/2011   Procedure: ANTERIOR CERVICAL DECOMPRESSION/DISCECTOMY FUSION 2 LEVELS;  Surgeon: Floyce Stakes, MD;  [facility];  Service: Neurosurgery;  Laterality: N/A;  Cervical four-five Cervical five-six  Anterior cervical decompression/diskectomy, fusion, plate   ATHERECTOMY N/A 01/02/2013   Procedure: ATHERECTOMY;  Surgeon: Lorretta Harp, MD;  Location: [facility] CATH LAB;  Service: Cardiovascular;  Laterality: N/A;   BACK SURGERY     x3   CARDIAC CATHETERIZATION  08/19/2012   CABG warranted; Severe ostial LCx stenosis with mid to distal vessel occlusion   COLONOSCOPY W/ POLYPECTOMY  08/28/11   Rourk-tubular adenomas removed from ascending colon, suboptimal prep, diminutive rectal polyps   COLONOSCOPY WITH PROPOFOL N/A 06/13/2017   Procedure: COLONOSCOPY WITH PROPOFOL;  Surgeon: Daneil Dolin, MD;  [facility];  Service: Endoscopy;  Laterality: N/A;  9:15am   CORONARY ARTERY BYPASS GRAFT  08/22/2012   Procedure: CORONARY ARTERY BYPASS GRAFTING (CABG);  Surgeon: Grace Isaac, MD;  Location: [facility]  OR;  Service: Open Heart Surgery;  Laterality: N/A;  CABG x four,  using left internal mammary artery and right leg greater saphenous vein harvested endoscopically   ESOPHAGOGASTRODUODENOSCOPY  05/24/01   by Dr. Gala Romney   ESOPHAGOGASTRODUODENOSCOPY  08/28/11   Rourk-erosive reflux esophagitis, gastric erosions    ESOPHAGOGASTRODUODENOSCOPY (EGD) WITH PROPOFOL N/A 06/13/2017   Procedure: ESOPHAGOGASTRODUODENOSCOPY (EGD) WITH PROPOFOL;  Surgeon: Daneil Dolin, MD;  [facility];  Service: Endoscopy;  Laterality: N/A;   INTRAOPERATIVE TRANSESOPHAGEAL ECHOCARDIOGRAM  08/22/2012   Procedure: INTRAOPERATIVE TRANSESOPHAGEAL ECHOCARDIOGRAM;  Surgeon: Grace Isaac, MD;  Location: [place];  Service: Open Heart Surgery;  Laterality: N/A;   IR 3D INDEPENDENT WKST  05/17/2022   IR ANGIO INTRA EXTRACRAN SEL INTERNAL CAROTID UNI R MOD SED  05/17/2022   IR ANGIOGRAM FOLLOW UP STUDY  05/17/2022   IR NEURO EACH ADD'L AFTER BASIC UNI RIGHT (MS)  05/17/2022   IR TRANSCATH/EMBOLIZ  05/17/2022   IR US GUIDE VASC ACCESS RIGHT  05/17/2022   LEFT HEART CATHETERIZATION WITH CORONARY ANGIOGRAM N/A 08/19/2012   Procedure: LEFT HEART CATHETERIZATION WITH CORONARY ANGIOGRAM;  Surgeon: [name], MD;  Location: [facility] CATH LAB;  Service: Cardiovascular;  Laterality: N/A;   LOWER EXTREMITY ANGIOGRAM N/A 11/17/2012   Procedure: LOWER EXTREMITY ANGIOGRAM;  Surgeon: Lorretta Harp, MD;  Location: [facility] CATH LAB;  Service: Cardiovascular;  Laterality: N/A;   LOWER EXTREMITY ANGIOGRAM N/A 02/02/2013   Procedure: LOWER EXTREMITY ANGIOGRAM;  Surgeon: Lorretta Harp, MD;  Location: [facility] CATH LAB;  Service: Cardiovascular;  Laterality: N/A;   LUMBAR DISC SURGERY     LUMBAR FUSION  ~ 2011   PELVIC ABCESS DRAINAGE     x2   PERIPHERAL VASCULAR ATHERECTOMY  03/30/2021   Procedure: PERIPHERAL VASCULAR ATHERECTOMY;  Surgeon: Lorretta Harp, MD;  Location: [place] [facility];  Service: Cardiovascular;;  left common iliac artery   PERIPHERAL VASCULAR BALLOON ANGIOPLASTY Left 04/12/2022   Procedure: PERIPHERAL VASCULAR BALLOON ANGIOPLASTY;  Surgeon: Lorretta Harp, MD;  Location: [place] [facility];  Service: Cardiovascular;  Laterality: Left;  SFA   PERIPHERAL VASCULAR INTERVENTION  03/30/2021   Procedure: PERIPHERAL VASCULAR  INTERVENTION;  Surgeon: Lorretta Harp, MD;  Location: [place] [facility];  Service: Cardiovascular;;   POLYPECTOMY  06/13/2017   Procedure: POLYPECTOMY;  Surgeon: Daneil Dolin, MD;  [facility];  Service: Endoscopy;;  colon   RADIOLOGY WITH ANESTHESIA N/A 05/17/2022   Procedure: Diagnostic Cerebral Angiogram, Possible Aneurysm Coiling, Possible Stent;  Surgeon: Consuella Lose, MD;  Location: [place];  Service: Radiology;  Laterality: N/A;   SHOULDER SURGERY Right    VASCULAR SURGERY Right 01/02/2013   diamondback orbital rotational  atherectomy, chocolate balloon and IDEV  Stent.   No Known Allergies Prior to Admission medications   Medication Sig Start Date End Date Taking? Authorizing Provider  acetaminophen (TYLENOL) 650 MG CR tablet Take 650 mg by mouth every 8 (eight) hours as needed for pain.   Yes [provider]  aspirin EC 81 MG EC tablet Take 1 tablet (81 mg total) by mouth daily. 09/11/18  Yes Tat, Shanon Brow, MD  carboxymethylcellulose (REFRESH PLUS) 0.5 % SOLN Place 1 drop into both eyes 3 (three) times daily as needed (dry eyes).   Yes [provider]  clopidogrel (PLAVIX) 75 MG tablet Take 1 tablet (75 mg total) by mouth daily with breakfast. 05/03/15  Yes Hilty, Nadean Corwin, MD  cyclobenzaprine (FLEXERIL) 10 MG tablet Take  1 tablet (10 mg total) by mouth 3 (three) times daily as needed for muscle spasms. 10/31/17  Yes Newman Pies, MD  empagliflozin (JARDIANCE) 25 MG TABS tablet Take 25 mg by mouth daily.   Yes [provider]  gabapentin (NEURONTIN) 300 MG capsule Take 300 mg by mouth 3 (three) times daily. 08/19/18  Yes [provider]  HUMALOG MIX 75/25 KWIKPEN (75-25) 100 UNIT/ML Kwikpen Inject 50 Units into the skin in the morning and at bedtime. 09/07/18  Yes [provider]  isosorbide mononitrate (IMDUR) 30 MG 24 hr tablet Take 1 tablet (30 mg total) by mouth daily. 04/25/22  Yes Lorretta Harp, MD  LANTUS SOLOSTAR  100 UNIT/ML Solostar Pen Inject 50 Units into the skin 2 (two) times daily. 09/08/19  Yes [provider]  losartan (COZAAR) 50 MG tablet Take 50 mg by mouth daily. 04/27/22  Yes [provider]  Loteprednol Etabonate (EYSUVIS) 0.25 % SUSP Place 1 drop into both eyes at bedtime as needed ([place]).   Yes [provider]  metoprolol succinate (TOPROL-XL) 25 MG 24 hr tablet TAKE 1 TABLET BY MOUTH ONCE A DAY. Patient taking differently: Take 25 mg by mouth daily. 09/26/22  Yes Hilty, Nadean Corwin, MD  oxyCODONE-acetaminophen (PERCOCET) 10-325 MG tablet Take 1 tablet by mouth 4 (four) times daily as needed for pain. 02/20/21  Yes [provider]  PROAIR HFA 108 (90 BASE) MCG/ACT inhaler Inhale 2 puffs into the lungs every 6 (six) hours as needed for wheezing or shortness of breath.  08/14/13  Yes [provider]  rosuvastatin (CRESTOR) 40 MG tablet Take 1 tablet (40 mg total) by mouth daily. 09/10/18  Yes Tat, Shanon Brow, MD  zolpidem (AMBIEN CR) 12.5 MG CR tablet Take 12.5 mg by mouth at bedtime. 06/15/20  Yes [provider]  Blood Glucose Monitoring Suppl ([place]) w/Device KIT  11/21/20   [provider]  Lancets [facility] DELICA PLUS 123XX123) [place] Apply topically daily. 02/07/21   [provider]  Lancets (ONETOUCH DELICA PLUS 123XX123) MISC Apply topically 2 (two) times daily. 11/21/20   [provider]  Na Sulfate-K Sulfate-Mg Sulf 17.5-3.13-1.6 GM/177ML SOLN As directed Patient not taking: Reported on 10/15/2022 09/19/22   Rourk, Cristopher Estimable, MD  [facility] VERIO test strip 2 (two) times daily. 11/21/20   [provider]  Oxycodone HCl 10 MG TABS Take 10 mg by mouth every 6 (six) hours as needed (Pain). Patient not taking: Reported on 10/15/2022 09/10/22   [provider]   Social History   Socioeconomic History   Marital status: Single    Spouse name: Not on file   Number of children: 1   Years of  education: Not on file   Highest education level: Not on file  Occupational History   Occupation: disabled    Employer: DISABLED  Tobacco Use   Smoking status: Every Day    Packs/day: 1.00    Years: 39.00    Total pack years: 39.00    Types: Cigarettes   Smokeless tobacco: Never  Vaping Use   Vaping Use: Never used  Substance and Sexual Activity   Alcohol use: Yes    Alcohol/week: 6.0 standard drinks of alcohol    Types: 6 Cans of beer per week    Comment: ocass.   Drug use: No   Sexual activity: Not Currently  Other Topics Concern   Not on file  Social History Narrative   Lives w/ girlfriend  Social Determinants of Health   Financial Resource Strain: Not on file  Food Insecurity: Not on file  Transportation Needs: Not on file  Physical Activity: Not on file  Stress: Not on file  Social Connections: Not on file   Family History  Problem Relation Age of Onset   Anesthesia problems Neg Hx    Colon cancer Neg Hx    Gastric cancer Neg Hx    Esophageal cancer Neg Hx    Colon polyps Neg Hx     ROS: Currently denies lightheadedness, dizziness, Fever, chills, CP, SOB.   No personal history of DVT, PE, MI, or CVA. No loose teeth or dentures All other systems have been reviewed and were otherwise currently negative with the exception of those mentioned in the HPI and as above.  Objective: Vitals: Ht: '5\' 11"'$  Wt: 187 lbs Temp: 98.3 BP: 133/78 Pulse: 70 O2 92% on room air.   Physical Exam: General: Alert, NAD. Trendelenberg Gait  HEENT: EOMI, Good Neck Extension  Pulm: No increased work of breathing.  Clear B/L A/P w/o crackle or wheeze.  CV: RRR, No m/g/r appreciated  GI: soft, NT, ND Neuro: Neuro without gross focal deficit.  Sensation intact distally Skin: No lesions in the area of chief complaint MSK/Surgical Site: 0-80 degrees of forward flexion at the right hip.  Locates pain to the lateral right hip.  No significant pain with passive internal or external rotation  today.  Dorsiflexion and plantarflexion intact at the ankle.  Sensation is limited at the foot.  Not able to palpate PT or DP pulse.  With Doppler ultrasound was not able to hear a DP pulse, but he does have an audible PT pulse to Doppler ultrasound.    Imaging Review Plain radiographs demonstrate severe degenerative joint disease of the right hip.   The bone quality appears to be adequate for age and reported activity level.  Preoperative templating of the joint replacement has been completed, documented, and submitted to the Operating Room personnel in order to optimize intra-operative equipment management.  Assessment: djd right hip  Plan: Plan for Procedure(s): TOTAL HIP ARTHROPLASTY  The patient history, physical exam, clinical judgement of the provider and imaging are consistent with end stage degenerative joint disease and total joint arthroplasty is deemed medically necessary. The treatment options including medical management, injection therapy, and arthroplasty were discussed at length. The risks and benefits of Procedure(s): TOTAL HIP ARTHROPLASTY were presented and reviewed.  The risks of nonoperative treatment, versus surgical intervention including but not limited to continued pain, aseptic loosening, stiffness, dislocation/subluxation, infection, bleeding, nerve injury, blood clots, cardiopulmonary complications, morbidity, mortality, among others were discussed. The patient verbalizes understanding and wishes to proceed with the plan.  Patient is being admitted for surgery, pain control, PT, prophylactic antibiotics, VTE prophylaxis, progressive ambulation, ADL's and discharge planning.   The patient does not meet the criteria for TXA which will be used perioperatively.   Baseline Plavix or Aspirin will be used postoperatively for DVT prophylaxis in addition to SCDs, and early ambulation. The patient is planning to be discharged home with OPPT in care of family   Jola Baptist 10/17/2022 4:47 PM

## 2022-10-22 ENCOUNTER — Other Ambulatory Visit (HOSPITAL_COMMUNITY): Payer: Self-pay | Admitting: Cardiovascular Disease

## 2022-10-22 ENCOUNTER — Encounter (HOSPITAL_COMMUNITY): Payer: 59

## 2022-10-22 ENCOUNTER — Telehealth: Payer: Self-pay | Admitting: Cardiovascular Disease

## 2022-10-22 DIAGNOSIS — I739 Peripheral vascular disease, unspecified: Secondary | ICD-10-CM

## 2022-10-22 NOTE — Telephone Encounter (Signed)
Pt c/o swelling: STAT is pt has developed SOB within 24 hours  How much weight have you gained and in what time span? Not sure  If swelling, where is the swelling located? Both legs  Are you currently taking a fluid pill? No  Are you currently SOB? No  Do you have a log of your daily weights (if so, list)? N/a  Have you gained 3 pounds in a day or 5 pounds in a week? Not sure  Have you traveled recently? No    Patient reschedule LE Arterial test previously canceled, but wanted to make sure nothing sooner was available due to the increased swelling. Please advise.

## 2022-10-22 NOTE — Telephone Encounter (Signed)
Called patient, advised that he has been having swelling/pain in both legs. He was scheduled to have LEA's but rescheduled due to having hip surgery- it was scheduled for tomorrow but was cancelled due to his A1C being elevated.   Patient states he would like to get the LEA done quicker, he is scheduled for 04/01- I can check with scheduling team if this is the quickest.   Patient denies redness, he is elevated his legs, but the swelling does not improve. He states the pain occurs with walking. He states yesterday his right leg was worse than the left, but today they are both hurting him.  Will route to MD to see recommendations.  Thanks!

## 2022-10-23 ENCOUNTER — Encounter (HOSPITAL_COMMUNITY): Admission: RE | Payer: Self-pay | Source: Home / Self Care

## 2022-10-23 ENCOUNTER — Ambulatory Visit (HOSPITAL_COMMUNITY): Admission: RE | Admit: 2022-10-23 | Payer: 59 | Source: Home / Self Care | Admitting: Orthopedic Surgery

## 2022-10-23 DIAGNOSIS — L0292 Furuncle, unspecified: Secondary | ICD-10-CM | POA: Diagnosis not present

## 2022-10-23 DIAGNOSIS — Z794 Long term (current) use of insulin: Secondary | ICD-10-CM | POA: Diagnosis not present

## 2022-10-23 DIAGNOSIS — E1165 Type 2 diabetes mellitus with hyperglycemia: Secondary | ICD-10-CM | POA: Diagnosis not present

## 2022-10-23 DIAGNOSIS — E1100 Type 2 diabetes mellitus with hyperosmolarity without nonketotic hyperglycemic-hyperosmolar coma (NKHHC): Secondary | ICD-10-CM | POA: Diagnosis not present

## 2022-10-23 SURGERY — ARTHROPLASTY, HIP, TOTAL,POSTERIOR APPROACH
Anesthesia: Choice | Site: Hip | Laterality: Right

## 2022-10-25 ENCOUNTER — Ambulatory Visit (HOSPITAL_COMMUNITY): Payer: 59 | Admitting: Physical Therapy

## 2022-10-29 ENCOUNTER — Encounter (HOSPITAL_COMMUNITY): Payer: 59 | Admitting: Physical Therapy

## 2022-10-30 ENCOUNTER — Encounter (HOSPITAL_COMMUNITY): Payer: 59 | Admitting: Physical Therapy

## 2022-11-01 ENCOUNTER — Encounter (HOSPITAL_COMMUNITY): Payer: 59 | Admitting: Physical Therapy

## 2022-11-05 ENCOUNTER — Encounter (HOSPITAL_COMMUNITY): Payer: 59 | Admitting: Physical Therapy

## 2022-11-05 ENCOUNTER — Ambulatory Visit (HOSPITAL_COMMUNITY)
Admission: RE | Admit: 2022-11-05 | Discharge: 2022-11-05 | Disposition: A | Discharge status: Home / Self Care | Payer: 59 | Source: Ambulatory Visit | Attending: Cardiovascular Disease | Admitting: Cardiovascular Disease

## 2022-11-05 ENCOUNTER — Ambulatory Visit (HOSPITAL_BASED_OUTPATIENT_CLINIC_OR_DEPARTMENT_OTHER)
Admission: RE | Admit: 2022-11-05 | Discharge: 2022-11-05 | Disposition: A | Discharge status: Home / Self Care | Payer: 59 | Source: Ambulatory Visit | Attending: Internal Medicine | Admitting: Internal Medicine

## 2022-11-05 DIAGNOSIS — Z720 Tobacco use: Secondary | ICD-10-CM | POA: Diagnosis not present

## 2022-11-05 DIAGNOSIS — I739 Peripheral vascular disease, unspecified: Secondary | ICD-10-CM | POA: Diagnosis not present

## 2022-11-05 DIAGNOSIS — Z95820 Peripheral vascular angioplasty status with implants and grafts: Secondary | ICD-10-CM | POA: Insufficient documentation

## 2022-11-06 LAB — VAS US ABI WITH/WO TBI
Left ABI: 0.82
Right ABI: 0.55

## 2022-11-07 ENCOUNTER — Encounter (HOSPITAL_COMMUNITY): Payer: 59 | Admitting: Physical Therapy

## 2022-11-12 ENCOUNTER — Encounter (HOSPITAL_COMMUNITY): Payer: 59 | Admitting: Physical Therapy

## 2022-11-14 ENCOUNTER — Encounter (HOSPITAL_COMMUNITY): Payer: 59 | Admitting: Physical Therapy

## 2022-11-19 ENCOUNTER — Encounter (HOSPITAL_COMMUNITY): Payer: 59 | Admitting: Physical Therapy

## 2022-11-20 ENCOUNTER — Ambulatory Visit: Payer: 59 | Attending: Cardiovascular Disease | Admitting: Cardiovascular Disease

## 2022-11-20 ENCOUNTER — Encounter: Payer: Self-pay | Admitting: Cardiovascular Disease

## 2022-11-20 VITALS — BP 134/72 | HR 85 | Ht 71.0 in | Wt 194.0 lb

## 2022-11-20 DIAGNOSIS — I10 Essential (primary) hypertension: Secondary | ICD-10-CM

## 2022-11-20 DIAGNOSIS — I739 Peripheral vascular disease, unspecified: Secondary | ICD-10-CM

## 2022-11-20 DIAGNOSIS — E1165 Type 2 diabetes mellitus with hyperglycemia: Secondary | ICD-10-CM | POA: Diagnosis not present

## 2022-11-20 DIAGNOSIS — Z951 Presence of aortocoronary bypass graft: Secondary | ICD-10-CM

## 2022-11-20 DIAGNOSIS — Z720 Tobacco use: Secondary | ICD-10-CM | POA: Diagnosis not present

## 2022-11-20 DIAGNOSIS — Z794 Long term (current) use of insulin: Secondary | ICD-10-CM | POA: Diagnosis not present

## 2022-11-20 NOTE — Patient Instructions (Signed)
Medication Instructions:  Your physician recommends that you continue on your current medications as directed. Please refer to the Current Medication list given to you today.  *If you need a refill on your cardiac medications before your next appointment, please call your pharmacy*   Lab Work: Your physician recommends that you have labs drawn today: BMET & CBC  If you have labs (blood work) drawn today and your tests are completely normal, you will receive your results only by: MyChart Message (if you have MyChart) OR A paper copy in the mail If you have any lab test that is abnormal or we need to change your treatment, we will call you to review the results.   Testing/Procedures: Your physician has requested that you have an Aorta/Iliac Duplex. This will be take place at [address] [facility], [address].  No food after 11PM the night before.  Water is OK. (Don't drink liquids if you have been instructed not to for ANOTHER test) Avoid foods that produce bowel gas, for 24 hours prior to exam (see below). No breakfast, no chewing gum, no smoking or carbonated beverages. Patient may take morning medications with water. Come in for test at least 15 minutes early to register. To be done 1-2 weeks after procedure (4/25)  Your physician has requested that you have a lower extremity arterial duplex. During this test, ultrasound is used to evaluate arterial blood flow in the legs. Allow one hour for this exam. There are no restrictions or special instructions. This will take place at [address] [facility], [address]. To be done 1-2 weeks after procedure (4/25)  Your physician has requested that you have an ankle brachial index (ABI). During this test an ultrasound and blood pressure cuff are used to evaluate the arteries that supply the arms and legs with blood. Allow thirty minutes for this exam. There are no restrictions or special instructions. This will take place at [address] [facility], [address].  To be done 1-2 weeks after procedure (4/25)     Follow-Up: At [facility], you and your health needs are our priority.  As part of our continuing mission to provide you with exceptional heart care, we have created designated Provider Care Teams.  These Care Teams include your primary Cardiologist (physician) and Advanced Practice Providers (APPs -  Physician Assistants and Nurse Practitioners) who all work together to provide you with the care you need, when you need it.  We recommend signing up for the patient portal called "MyChart".  Sign up information is provided on this After Visit Summary.  MyChart is used to connect with patients for Virtual Visits (Telemedicine).  Patients are able to view lab/test results, encounter notes, upcoming appointments, etc.  Non-urgent messages can be sent to your provider as well.   To learn more about what you can do with MyChart, go to [email or web address].    Your next appointment:   2-3 week(s) after procedure   Provider:   Nanetta Batty, MD   Other Instructions       Cardiac/Peripheral Catheterization   You are scheduled for a Peripheral Angiogram on Thursday, April 25 with Dr. Nanetta Batty.  1. Please arrive at the [facility] (Main Entrance A) at [facility]: [address] [place], [place] [address] at 9:30 AM (This time is two hours before your procedure to ensure your preparation). Free valet parking service is available. You will check in at ADMITTING. The support person will be asked to wait in the  waiting room.  It is OK to have someone drop you off and come back when you are ready to be discharged.        Special note: Every effort is made to have your procedure done on time. Please understand that emergencies sometimes delay scheduled procedures.  2. Diet: Do not eat solid foods after midnight.  You may have clear liquids until 5 AM the day of the procedure.  3. Labs: You will need to have blood drawn today  (4/16).  4. Medication instructions in preparation for your procedure:   Take only 25 units of insulin the night before your procedure. Do not take any insulin on the day of the procedure.  On the morning of your procedure, take Aspirin 81 mg and Plavix/Clopidogrel and any morning medicines NOT listed above.  You may use sips of water.  5. Plan to go home the same day, you will only stay overnight if medically necessary. 6. You MUST have a responsible adult to drive you home. 7. An adult MUST be with you the first 24 hours after you arrive home. 8. Bring a current list of your medications, and the last time and date medication taken. 9. Bring ID and current insurance cards. 10.Please wear clothes that are easy to get on and off and wear slip-on shoes.  Thank you for allowing [place] to care for you!   -- [facility]

## 2022-11-20 NOTE — Assessment & Plan Note (Signed)
Ongoing tobacco abuse of 1/2 pack/day recalcitrant to resector modification. 

## 2022-11-20 NOTE — Assessment & Plan Note (Signed)
History of essential hypertension blood pressure measured today at 134/72.  He is on losartan and metoprolol.

## 2022-11-20 NOTE — Assessment & Plan Note (Signed)
History is status post catheterization by Dr. Rennis Golden 08/19/2012 revealing three-vessel disease.  He ultimately underwent CABG x 4 in August 22, 2012 with a LIMA to his LAD, vein to diagonal branch, ramus branch and RCA.  He has been asymptomatic since that time from a cardiac point of view.  He denies chest pain or shortness of breath.

## 2022-11-20 NOTE — Progress Notes (Signed)
11/20/2022 [name]   [birth year]  [id number]  Primary Physician [name], NP Primary Cardiologist: Runell Gess MD Nicholes Calamity, [place]  HPI:  [name] is a 63 y.o.  mildly overweight single, though lives with his girlfriend [name], Caucasian male father of one child seen for peripheral vascular disease because of claudication and diminished ABIs.  I last saw him in the office 04/25/2022.  Risk factors include continued tobacco abuse of one pack -2 pks per day, diabetes and hyperlipidemia. He was catheterized by Dr. Marinus Maw 08/19/2012 demonstrated three-vessel disease. He ultimately underwent coronary artery bypass grafting x4 January 17 for LIMA to his LAD, a vein to the diagonal branch, ramus intermedius branch and to the RCA. He tolerated the procedure well. His EF was 50-55% by 2-D echo. Dopplers revealed ABIs of 0.5 and 0.7. He complaind of left greater than right claudication at one block. He underwent percutaneous revascularization using the Viance CTO catheter, diamondback orbital rotational atherectomy, chocolate balloon and IDEV Stent successfully to the mid Rt SFA. He enjoyed an excellent angiographic and clinical result with improvement Dopplers. He underwent an attempt at percutaneous revascularization of his long segment calcified left SFA CTO for lifestyle limiting claudication.I was unable to cross the lesion with a Viance CTO catheter.the patient presents in followup. Continues to have left lower extremity claudication. Also continues to smoke. We discussed options including retrograde attempt at history catheterization by Dr. Hoy Finlay in [place] versus femoropopliteal bypass grafting. Since she's had coronary bypass grafting, I suspect he does not have adequate venous conduits left to perform lower shimmy surgical room randomization and what was thought to require PTFE.  I did send him to [facility] where Dr. Hoy Finlay been 6 hours revascularizing a  left SFA CTO successfully.  He is continue to smoke a pack a day.  Over the last several months he has noticed increasing left greater than right lower extremity lifestyle of any claudication with recent Doppler studies performed 03/16/2021 revealing a right ABI of 0.80 and a left of 0.74.  He does have a high-frequency signal in his left SFA.  He wishes to proceed with outpatient endovascular therapy.   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  He underwent orbital atherectomy followed by VBX covered stenting (8 mm x 29 mm).  He was discharged home the following day.  His Dopplers performed 04/03/2021 revealed a widely patent stent.  His claudication resolved after that time.  He does unfortunately continue to smoke.   He has had recurrent claudication symptoms beginning of this year.  He had Dopplers performed 02/20/2022 revealing right ABI of 0.73 and a left ABI of 0.61.  He does have a high-frequency signal in his proximal left SFA with patent SFA stents bilaterally.  He wishes to proceed with outpatient peripheral angiography.   I performed angiography and intervention on him 04/12/2022 revealing patent left common iliac artery stent, patent bilateral SFA stents and a subtotally occluded calcified proximal left left SFA stenosis which I intervened on using chocolate balloon angioplasty followed by DCB.  His claudication had resolved and his Dopplers have markedly improved.  He does continue to smoke unfortunately.  Since I saw him 6 months ago he is developed progressive right calf claudication with Dopplers that showed a decline in his right ABI and potential occlusion of his right popliteal artery.  He does continue to smoke  a half a pack a day and denies chest pain or shortness of breath.   Current Meds  Medication Sig   acetaminophen (TYLENOL) 650 MG CR tablet Take 650 mg by mouth every 8 (eight)  hours as needed for pain.   aspirin EC 81 MG EC tablet Take 1 tablet (81 mg total) by mouth daily.   Blood Glucose Monitoring Suppl (ONETOUCH VERIO FLEX SYSTEM) w/Device KIT    carboxymethylcellulose (REFRESH PLUS) 0.5 % SOLN Place 1 drop into both eyes 3 (three) times daily as needed (dry eyes).   clopidogrel (PLAVIX) 75 MG tablet Take 1 tablet (75 mg total) by mouth daily with breakfast.   cyclobenzaprine (FLEXERIL) 10 MG tablet Take 1 tablet (10 mg total) by mouth 3 (three) times daily as needed for muscle spasms.   empagliflozin (JARDIANCE) 25 MG TABS tablet Take 25 mg by mouth daily.   gabapentin (NEURONTIN) 300 MG capsule Take 300 mg by mouth 3 (three) times daily.   GLIMEPIRIDE PO Take 1 tablet by mouth in the morning and at bedtime.   HUMALOG MIX 75/25 KWIKPEN (75-25) 100 UNIT/ML Kwikpen Inject 50 Units into the skin in the morning and at bedtime.   isosorbide mononitrate (IMDUR) 30 MG 24 hr tablet Take 1 tablet (30 mg total) by mouth daily.   Lancets (ONETOUCH DELICA PLUS LANCET33G) MISC Apply topically daily.   Lancets (ONETOUCH DELICA PLUS LANCET33G) MISC Apply topically 2 (two) times daily.   LANTUS SOLOSTAR 100 UNIT/ML Solostar Pen Inject 50 Units into the skin 2 (two) times daily.   losartan (COZAAR) 50 MG tablet Take 50 mg by mouth daily.   Loteprednol Etabonate (EYSUVIS) 0.25 % SUSP Place 1 drop into both eyes at bedtime as needed (Dry Eye).   metoprolol succinate (TOPROL-XL) 25 MG 24 hr tablet TAKE 1 TABLET BY MOUTH ONCE A DAY. (Patient taking differently: Take 25 mg by mouth daily.)   Na Sulfate-K Sulfate-Mg Sulf 17.5-3.13-1.6 GM/177ML SOLN As directed   ONETOUCH VERIO test strip 2 (two) times daily.   Oxycodone HCl 10 MG TABS Take 10 mg by mouth every 6 (six) hours as needed (Pain).   oxyCODONE-acetaminophen (PERCOCET) 10-325 MG tablet Take 1 tablet by mouth 4 (four) times daily as needed for pain.   PROAIR HFA 108 (90 BASE) MCG/ACT inhaler Inhale 2 puffs into the lungs every  6 (six) hours as needed for wheezing or shortness of breath.    rosuvastatin (CRESTOR) 40 MG tablet Take 1 tablet (40 mg total) by mouth daily.   zolpidem (AMBIEN CR) 12.5 MG CR tablet Take 12.5 mg by mouth at bedtime.     No Known Allergies  Social History   Socioeconomic History   Marital status: Single    Spouse name: Not on file   Number of children: 1   Years of education: Not on file   Highest education level: Not on file  Occupational History   Occupation: disabled    Employer: DISABLED  Tobacco Use   Smoking status: Every Day    Packs/day: 1.00    Years: 39.00    Additional pack years: 0.00    Total pack years: 39.00    Types: Cigarettes   Smokeless tobacco: Never  Vaping Use   Vaping Use: Never used  Substance and Sexual Activity   Alcohol use: Yes    Alcohol/week: 6.0 standard drinks of alcohol    Types: 6 Cans of beer per week    Comment: ocass.   Drug use: No  Sexual activity: Not Currently  Other Topics Concern   Not on file  Social History Narrative   Lives w/ girlfriend   Social Determinants of Health   Financial Resource Strain: Not on file  Food Insecurity: Not on file  Transportation Needs: Not on file  Physical Activity: Not on file  Stress: Not on file  Social Connections: Not on file  Intimate Partner Violence: Not on file     Review of Systems: General: negative for chills, fever, night sweats or weight changes.  Cardiovascular: negative for chest pain, dyspnea on exertion, edema, orthopnea, palpitations, paroxysmal nocturnal dyspnea or shortness of breath Dermatological: negative for rash Respiratory: negative for cough or wheezing Urologic: negative for hematuria Abdominal: negative for nausea, vomiting, diarrhea, bright red blood per rectum, melena, or hematemesis Neurologic: negative for visual changes, syncope, or dizziness All other systems reviewed and are otherwise negative except as noted above.    Blood pressure 134/72,  pulse 85, height  (1.803 m), weight 194 lb (88 kg).  General appearance: alert and no distress Neck: no adenopathy, no carotid bruit, no JVD, supple, symmetrical, trachea midline, and thyroid not enlarged, symmetric, no tenderness/mass/nodules Lungs: clear to auscultation bilaterally Heart: regular rate and rhythm, S1, S2 normal, no murmur, click, rub or gallop Extremities: extremities normal, atraumatic, no cyanosis or edema Pulses: Diminished pedal pulses Skin: Skin color, texture, turgor normal. No rashes or lesions Neurologic: Grossly normal  EKG sinus rhythm at 85 left axis deviation septal Q waves with nonspecific ST and T wave changes.  I personally reviewed this EKG.  ASSESSMENT AND PLAN:   Essential hypertension History of essential hypertension blood pressure measured today at 134/72.  He is on losartan and metoprolol.  Tobacco abuse Ongoing tobacco abuse of 1/2 pack/day recalcitrant to resector modification.  S/P CABG x 4 History is status post catheterization by Dr. Rennis Golden 08/19/2012 revealing three-vessel disease.  He ultimately underwent CABG x 4 in August 22, 2012 with a LIMA to his LAD, vein to diagonal branch, ramus branch and RCA.  He has been asymptomatic since that time from a cardiac point of view.  He denies chest pain or shortness of breath.  PVD (peripheral vascular disease) with claudication (HCC) History of peripheral arterial disease status post right SFA CTO intervention by myself using orbital atherectomy, balloon angioplasty and I DEV stenting with excellent angiographic result.  I brought him back for attempt at left SFA 9 which is unsuccessful crossing.  Ultimately sent him to Dr. Hoy Finlay at [facility] who spent 6 hours opening up his left SFA.  I performed peripheral angiography on him 03/30/2021 revealing patent SFA stents with high-grade calcified left common iliac artery stenosis.  He underwent orbital atherectomy followed by VBX covered stenting  (8 mm x 29 mm).  His most recent intervention was performed by myself 04/12/2022.  A highly calcified high-grade proximal left SFA stenosis.  He underwent chocolate balloon angioplasty followed by DCB.  He has been complaining of progressive right calf claudication over the last several months with recent Doppler studies that show a decline in his right ABI from 0.75-0.55 with what appears to be a right popliteal occlusion.  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  He wishes to proceed with angiography and potential intervention.     Runell Gess MD FACP,FACC,FAHA, [facility] 11/20/2022 3:56 PM

## 2022-11-20 NOTE — Assessment & Plan Note (Signed)
History of peripheral arterial disease status post right SFA CTO intervention by myself using orbital atherectomy, balloon angioplasty and I DEV stenting with excellent angiographic result.  I brought him back for attempt at left SFA 9 which is unsuccessful crossing.  Ultimately sent him to Dr. Hoy Finlay at [facility] who spent 6 hours opening up his left SFA.  I performed peripheral angiography on him 03/30/2021 revealing patent SFA stents with high-grade calcified left common iliac artery stenosis.  He underwent orbital atherectomy followed by VBX covered stenting (8 mm x 29 mm).  His most recent intervention was performed by myself 04/12/2022.  A highly calcified high-grade proximal left SFA stenosis.  He underwent chocolate balloon angioplasty followed by DCB.  He has been complaining of progressive right calf claudication over the last several months with recent Doppler studies that show a decline in his right ABI from 0.75-0.55 with what appears to be a right popliteal occlusion.  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  He wishes to proceed with angiography and potential intervention.

## 2022-11-21 ENCOUNTER — Encounter (HOSPITAL_COMMUNITY): Payer: 59 | Admitting: Physical Therapy

## 2022-11-21 ENCOUNTER — Telehealth: Payer: Self-pay | Admitting: *Deleted

## 2022-11-21 LAB — CBC
Hematocrit: 47.6 % (ref 37.5–51.0)
Hemoglobin: 15.6 g/dL (ref 13.0–17.7)
MCH: 30.4 pg (ref 26.6–33.0)
MCHC: 32.8 g/dL (ref 31.5–35.7)
MCV: 93 fL (ref 79–97)
Platelets: 200 10*3/uL (ref 150–450)
RBC: 5.13 x10E6/uL (ref 4.14–5.80)
RDW: 12.8 % (ref 11.6–15.4)
WBC: 6.7 10*3/uL (ref 3.4–10.8)

## 2022-11-21 LAB — BASIC METABOLIC PANEL
BUN/Creatinine Ratio: 12 (ref 10–24)
BUN: 17 mg/dL (ref 8–27)
CO2: 24 mmol/L (ref 20–29)
Calcium: 9.5 mg/dL (ref 8.6–10.2)
Chloride: 101 mmol/L (ref 96–106)
Creatinine, Ser: 1.4 mg/dL — ABNORMAL HIGH (ref 0.76–1.27)
Glucose: 289 mg/dL — ABNORMAL HIGH (ref 70–99)
Potassium: 5.2 mmol/L (ref 3.5–5.2)
Sodium: 142 mmol/L (ref 134–144)
eGFR: 57 mL/min/{1.73_m2} — ABNORMAL LOW (ref >59)

## 2022-11-21 NOTE — Telephone Encounter (Signed)
Copied from 11/20/22 BMP results per Dr Allyson Sabal: "SCr elevated. Glu elevated. Will require pre hyderation"  Discussed pre-procedure hydration and instructed pt to arrive 11/29/22 at 6:30 AM for hydration prior to 11:30 AM procedure.

## 2022-11-26 ENCOUNTER — Other Ambulatory Visit: Payer: Self-pay | Admitting: Neurosurgery

## 2022-11-26 ENCOUNTER — Telehealth: Payer: Self-pay | Admitting: Cardiovascular Disease

## 2022-11-26 ENCOUNTER — Encounter (HOSPITAL_COMMUNITY): Payer: 59 | Admitting: Physical Therapy

## 2022-11-26 DIAGNOSIS — I6010 Nontraumatic subarachnoid hemorrhage from unspecified middle cerebral artery: Secondary | ICD-10-CM

## 2022-11-26 NOTE — Telephone Encounter (Signed)
Called pt back to discuss cancelling his PV angio procedure on 4/25. 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 Pt states that this is too much for him at the same time. Pt also has hip replacement surgery that has been scheduled for 5/21. Will cancel procedure with Dr. Allyson Sabal as well as follow up dopplers. Pushed follow up appointment with Dr. Allyson Sabal to July. Pt will come to appointment in July to discuss rescheduling his PV angio. Pt will call back if he needs to be seen sooner.

## 2022-11-26 NOTE — Telephone Encounter (Signed)
Patient is calling stating he needs to reschedule his procedure setup for 04/25 due to another procedure he has scheduled. Please advise.

## 2022-11-27 ENCOUNTER — Other Ambulatory Visit: Payer: Self-pay | Admitting: Neurosurgery

## 2022-11-27 NOTE — Telephone Encounter (Signed)
Patient cancelled 11/29/22 procedure, see 11/26/22 Telephone Note.

## 2022-11-28 ENCOUNTER — Encounter (HOSPITAL_COMMUNITY): Payer: 59 | Admitting: Physical Therapy

## 2022-11-29 ENCOUNTER — Ambulatory Visit (HOSPITAL_COMMUNITY): Admission: RE | Admit: 2022-11-29 | Payer: 59 | Source: Home / Self Care | Admitting: Cardiovascular Disease

## 2022-11-29 ENCOUNTER — Encounter (HOSPITAL_COMMUNITY): Admission: RE | Payer: Self-pay | Source: Home / Self Care

## 2022-11-29 SURGERY — ABDOMINAL AORTOGRAM W/LOWER EXTREMITY
Anesthesia: LOCAL

## 2022-12-04 ENCOUNTER — Other Ambulatory Visit: Payer: Self-pay

## 2022-12-04 ENCOUNTER — Ambulatory Visit (HOSPITAL_COMMUNITY)
Admission: RE | Admit: 2022-12-04 | Discharge: 2022-12-04 | Disposition: A | Discharge status: Home / Self Care | Payer: 59 | Source: Ambulatory Visit | Attending: Neurosurgery | Admitting: Neurosurgery

## 2022-12-04 ENCOUNTER — Other Ambulatory Visit: Payer: Self-pay | Admitting: Neurosurgery

## 2022-12-04 DIAGNOSIS — Z7902 Long term (current) use of antithrombotics/antiplatelets: Secondary | ICD-10-CM | POA: Diagnosis not present

## 2022-12-04 DIAGNOSIS — Z7982 Long term (current) use of aspirin: Secondary | ICD-10-CM | POA: Insufficient documentation

## 2022-12-04 DIAGNOSIS — I6010 Nontraumatic subarachnoid hemorrhage from unspecified middle cerebral artery: Secondary | ICD-10-CM | POA: Diagnosis not present

## 2022-12-04 DIAGNOSIS — I671 Cerebral aneurysm, nonruptured: Secondary | ICD-10-CM | POA: Diagnosis not present

## 2022-12-04 HISTORY — PX: IR ANGIO INTRA EXTRACRAN SEL INTERNAL CAROTID UNI R MOD SED: IMG5362

## 2022-12-04 HISTORY — PX: IR US GUIDE VASC ACCESS RIGHT: IMG2390

## 2022-12-04 LAB — CBC WITH DIFFERENTIAL/PLATELET
Abs Immature Granulocytes: 0.01 10*3/uL (ref 0.00–0.07)
Basophils Absolute: 0.1 10*3/uL (ref 0.0–0.1)
Basophils Relative: 1 %
Eosinophils Absolute: 0.3 10*3/uL (ref 0.0–0.5)
Eosinophils Relative: 4 %
HCT: 51.4 % (ref 39.0–52.0)
Hemoglobin: 16.5 g/dL (ref 13.0–17.0)
Immature Granulocytes: 0 %
Lymphocytes Relative: 23 %
Lymphs Abs: 1.8 10*3/uL (ref 0.7–4.0)
MCH: 30.6 pg (ref 26.0–34.0)
MCHC: 32.1 g/dL (ref 30.0–36.0)
MCV: 95.4 fL (ref 80.0–100.0)
Monocytes Absolute: 0.8 10*3/uL (ref 0.1–1.0)
Monocytes Relative: 10 %
Neutro Abs: 4.6 10*3/uL (ref 1.7–7.7)
Neutrophils Relative %: 62 %
Platelets: 181 10*3/uL (ref 150–400)
RBC: 5.39 MIL/uL (ref 4.22–5.81)
RDW: 13.6 % (ref 11.5–15.5)
WBC: 7.5 10*3/uL (ref 4.0–10.5)
nRBC: 0 % (ref 0.0–0.2)

## 2022-12-04 LAB — GLUCOSE, CAPILLARY: Glucose-Capillary: 140 mg/dL — ABNORMAL HIGH (ref 70–99)

## 2022-12-04 LAB — BASIC METABOLIC PANEL
Anion gap: 12 (ref 5–15)
BUN: 17 mg/dL (ref 8–23)
CO2: 21 mmol/L — ABNORMAL LOW (ref 22–32)
Calcium: 9.1 mg/dL (ref 8.9–10.3)
Chloride: 103 mmol/L (ref 98–111)
Creatinine, Ser: 1.06 mg/dL (ref 0.61–1.24)
GFR, Estimated: >60 mL/min (ref >60)
Glucose, Bld: 128 mg/dL — ABNORMAL HIGH (ref 70–99)
Potassium: 4.4 mmol/L (ref 3.5–5.1)
Sodium: 136 mmol/L (ref 135–145)

## 2022-12-04 LAB — APTT: aPTT: 31 seconds (ref 24–36)

## 2022-12-04 LAB — PROTIME-INR
INR: 1.1 (ref 0.8–1.2)
Prothrombin Time: 13.8 seconds (ref 11.4–15.2)

## 2022-12-04 MED ORDER — MIDAZOLAM HCL 2 MG/2ML IJ SOLN
INTRAMUSCULAR | Status: AC
  Filled 2022-12-04: qty 2

## 2022-12-04 MED ORDER — SODIUM CHLORIDE 0.9 % IV SOLN: INTRAVENOUS | Status: DC

## 2022-12-04 MED ORDER — VERAPAMIL HCL 2.5 MG/ML IV SOLN
INTRAVENOUS | Status: AC
  Filled 2022-12-04: qty 2

## 2022-12-04 MED ORDER — HYDROCODONE-ACETAMINOPHEN 5-325 MG PO TABS: 1.0000 | ORAL_TABLET | ORAL | Status: DC | PRN

## 2022-12-04 MED ORDER — NITROGLYCERIN 1 MG/10 ML FOR IR/CATH LAB
INTRA_ARTERIAL | Status: AC
  Filled 2022-12-04: qty 10

## 2022-12-04 MED ORDER — VERAPAMIL HCL 2.5 MG/ML IV SOLN
INTRAVENOUS | Status: AC | PRN
  Administered 2022-12-04: 2.5 mg via INTRA_ARTERIAL

## 2022-12-04 MED ORDER — IOHEXOL 300 MG/ML  SOLN
100.0000 mL | Freq: Once | INTRAMUSCULAR | Status: AC | PRN
  Administered 2022-12-04: 75 mL via INTRA_ARTERIAL

## 2022-12-04 MED ORDER — NITROGLYCERIN 1 MG/10 ML FOR IR/CATH LAB
INTRA_ARTERIAL | Status: AC | PRN
  Administered 2022-12-04: 100 ug via INTRA_ARTERIAL

## 2022-12-04 MED ORDER — LORAZEPAM 2 MG/ML IJ SOLN
INTRAMUSCULAR | Status: AC | PRN
  Administered 2022-12-04: 1 mg via INTRAVENOUS
  Administered 2022-12-04: .5 mg via INTRAVENOUS

## 2022-12-04 MED ORDER — HEPARIN SODIUM (PORCINE) 1000 UNIT/ML IJ SOLN
INTRAMUSCULAR | Status: AC | PRN
  Administered 2022-12-04: 3000 [IU] via INTRAVENOUS

## 2022-12-04 MED ORDER — LIDOCAINE HCL 1 % IJ SOLN
INTRAMUSCULAR | Status: AC
  Filled 2022-12-04: qty 20

## 2022-12-04 MED ORDER — HEPARIN SODIUM (PORCINE) 1000 UNIT/ML IJ SOLN
INTRAMUSCULAR | Status: AC
  Filled 2022-12-04: qty 10

## 2022-12-04 MED ORDER — FENTANYL CITRATE (PF) 100 MCG/2ML IJ SOLN
INTRAMUSCULAR | Status: AC | PRN
  Administered 2022-12-04: 25 ug via INTRAVENOUS

## 2022-12-04 MED ORDER — FENTANYL CITRATE (PF) 100 MCG/2ML IJ SOLN
INTRAMUSCULAR | Status: AC
  Filled 2022-12-04: qty 2

## 2022-12-04 MED ORDER — LIDOCAINE HCL (PF) 1 % IJ SOLN
10.0000 mL | Freq: Once | INTRAMUSCULAR | Status: AC
  Administered 2022-12-04: 1 mL

## 2022-12-04 NOTE — Op Note (Signed)
  NEUROSURGERY BRIEF OPERATIVE  NOTE   PREOP DX: Aneurysm  POSTOP DX: Same  PROCEDURE: Diagnostic cerebral angiogram  SURGEON: Dr. Lisbeth Renshaw, MD  ANESTHESIA: IV Sedation with Local  APPROACH: Right trans-radial  EBL: Minimal  SPECIMENS: None  COMPLICATIONS: None  CONDITION: Stable to recovery  FINDINGS (Full report in [facility]): 1. 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

## 2022-12-04 NOTE — H&P (Signed)
Chief Complaint   Aneurysm  History of Present Illness  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 Patient actually initially presented to the outpatient clinic with resolving headaches suggesting subacute presentation of ruptured aneurysm. Patient had an unremarkable hospital course. He comes in today doing quite well. He no longer reports HA.  He remains on his baseline aspirin and Plavix.   Past Medical History   Past Medical History:  Diagnosis Date   Arthritis    "some; in hips sometimes" (01/02/2013)   Back pain    "w/prolonged walking or standing" (01/02/2013)   CAD (coronary artery disease)    CABG X 4 08/2012   COPD (chronic obstructive pulmonary disease) (HCC)    Elevated PSA    Esophageal erosions 05/24/2001   egd by Dr. Jena Gauss   GERD (gastroesophageal reflux disease)    H/O hiatal hernia    Headache    History of gout    Hyperlipidemia    Hypertension    not on any medication for htn at present   PAD (peripheral artery disease) (HCC)    LEA DOPPLER, 11/11/2012 - moderate arterial insufficiency to lower extremities at rest, BILATERAL SFA-demonstrates occlusive disease with reconstitution of flow   S/P percutaneous transluminal angioplasty (PTA) with stent placement 03/29/21 03/31/2021   Tobacco abuse    Tubular adenoma of colon 08/28/2011   Type II diabetes mellitus (HCC)    Unintentional weight loss     Past Surgical History   Past Surgical History:  Procedure Laterality Date   ABDOMINAL AORTOGRAM W/LOWER EXTREMITY N/A 03/30/2021   Procedure: ABDOMINAL AORTOGRAM W/LOWER EXTREMITY;  Surgeon: Runell Gess, MD;  Location: [facility] IN[facility] LAB;  Service: Cardiovascular;  Laterality: N/A;   ABDOMINAL AORTOGRAM W/LOWER EXTREMITY N/A 04/12/2022   Procedure: ABDOMINAL AORTOGRAM W/LOWER EXTREMITY;  Surgeon: Runell Gess, MD;  Location: [facility] CV LAB;  Service:  Cardiovascular;  Laterality: N/A;   ANTERIOR CERVICAL DECOMP/DISCECTOMY FUSION  12/19/2011   Procedure: ANTERIOR CERVICAL DECOMPRESSION/DISCECTOMY FUSION 2 LEVELS;  Surgeon: Karn Cassis, MD;  Location: [facility] NEURO ORS;  Service: Neurosurgery;  Laterality: N/A;  Cervical four-five Cervical five-six  Anterior cervical decompression/diskectomy, fusion, plate   ATHERECTOMY N/A 01/02/2013   Procedure: ATHERECTOMY;  Surgeon: Runell Gess, MD;  Location: [facility] CATH LAB;  Service: Cardiovascular;  Laterality: N/A;   BACK SURGERY     x3   CARDIAC CATHETERIZATION  08/19/2012   CABG warranted; Severe ostial LCx stenosis with mid to distal vessel occlusion   COLONOSCOPY W/ POLYPECTOMY  08/28/11   Rourk-tubular adenomas removed from ascending colon, suboptimal prep, diminutive rectal polyps   COLONOSCOPY WITH PROPOFOL N/A 06/13/2017   Procedure: COLONOSCOPY WITH PROPOFOL;  Surgeon: Corbin Ade, MD;  Location: [facility];  Service: Endoscopy;  Laterality: N/A;  9:15am   CORONARY ARTERY BYPASS GRAFT  08/22/2012   Procedure: CORONARY ARTERY BYPASS GRAFTING (CABG);  Surgeon: Delight Ovens, MD;  Location: [facility] OR;  Service: Open Heart Surgery;  Laterality: N/A;  CABG x four,  using left internal mammary artery and right leg greater saphenous vein harvested endoscopically   ESOPHAGOGASTRODUODENOSCOPY  05/24/01   by Dr. Jena Gauss   ESOPHAGOGASTRODUODENOSCOPY  08/28/11   Rourk-erosive reflux esophagitis, gastric erosions   ESOPHAGOGASTRODUODENOSCOPY (EGD) WITH PROPOFOL N/A 06/13/2017   Procedure: ESOPHAGOGASTRODUODENOSCOPY (EGD) WITH PROPOFOL;  Surgeon: Corbin Ade, MD;  Location: [facility];  Service: Endoscopy;  Laterality:  N/A;   INTRAOPERATIVE TRANSESOPHAGEAL ECHOCARDIOGRAM  08/22/2012   Procedure: INTRAOPERATIVE TRANSESOPHAGEAL ECHOCARDIOGRAM;  Surgeon: Delight Ovens, MD;  Location: [facility] OR;  Service: Open Heart Surgery;  Laterality: N/A;   IR 3D INDEPENDENT WKST  05/17/2022   IR ANGIO INTRA  EXTRACRAN SEL INTERNAL CAROTID UNI R MOD SED  05/17/2022   IR ANGIOGRAM FOLLOW UP STUDY  05/17/2022   IR NEURO EACH ADD'L AFTER BASIC UNI RIGHT (MS)  05/17/2022   IR TRANSCATH/EMBOLIZ  05/17/2022   IR US GUIDE VASC ACCESS RIGHT  05/17/2022   LEFT HEART CATHETERIZATION WITH CORONARY ANGIOGRAM N/A 08/19/2012   Procedure: LEFT HEART CATHETERIZATION WITH CORONARY ANGIOGRAM;  Surgeon: Chrystie Nose, MD;  Location: [facility] CATH LAB;  Service: Cardiovascular;  Laterality: N/A;   LOWER EXTREMITY ANGIOGRAM N/A 11/17/2012   Procedure: LOWER EXTREMITY ANGIOGRAM;  Surgeon: Runell Gess, MD;  Location: [facility] CATH LAB;  Service: Cardiovascular;  Laterality: N/A;   LOWER EXTREMITY ANGIOGRAM N/A 02/02/2013   Procedure: LOWER EXTREMITY ANGIOGRAM;  Surgeon: Runell Gess, MD;  Location: [facility] CATH LAB;  Service: Cardiovascular;  Laterality: N/A;   LUMBAR DISC SURGERY     LUMBAR FUSION  ~ 2011   PELVIC ABCESS DRAINAGE     x2   PERIPHERAL VASCULAR ATHERECTOMY  03/30/2021   Procedure: PERIPHERAL VASCULAR ATHERECTOMY;  Surgeon: Runell Gess, MD;  Location: [facility] INVASIVE CV LAB;  Service: Cardiovascular;;  left common iliac artery   PERIPHERAL VASCULAR BALLOON ANGIOPLASTY Left 04/12/2022   Procedure: PERIPHERAL VASCULAR BALLOON ANGIOPLASTY;  Surgeon: Runell Gess, MD;  [facility]: [facility];  Service: Cardiovascular;  Laterality: Left;  SFA   PERIPHERAL VASCULAR INTERVENTION  03/30/2021   Procedure: PERIPHERAL VASCULAR INTERVENTION;  Surgeon: Runell Gess, MD;  Location: [facility] CV LAB;  Service: Cardiovascular;;   POLYPECTOMY  06/13/2017   Procedure: POLYPECTOMY;  Surgeon: Corbin Ade, MD;  [facility]: [facility];  Service: Endoscopy;;  colon   RADIOLOGY WITH ANESTHESIA N/A 05/17/2022   Procedure: Diagnostic Cerebral Angiogram, Possible Aneurysm Coiling, Possible Stent;  Surgeon: Lisbeth Renshaw, MD;  Location: [facility] OR;  Service: Radiology;  Laterality: N/A;   SHOULDER SURGERY Right     VASCULAR SURGERY Right 01/02/2013   diamondback orbital rotational  atherectomy, chocolate balloon and IDEV  Stent.    Social History   Social History   Tobacco Use   Smoking status: Every Day    Packs/day: 1.00    Years: 39.00    Additional pack years: 0.00    Total pack years: 39.00    Types: Cigarettes   Smokeless tobacco: Never  Vaping Use   Vaping Use: Never used  Substance Use Topics   Alcohol use: Yes    Alcohol/week: 6.0 standard drinks of alcohol    Types: 6 Cans of beer per week    Comment: ocass.   Drug use: No    Medications   Prior to Admission medications   Medication Sig Start Date End Date Taking? Authorizing Provider  acetaminophen (TYLENOL) 650 MG CR tablet Take 650 mg by mouth every 8 (eight) hours as needed for pain.   Yes [provider]  aspirin EC 81 MG EC tablet Take 1 tablet (81 mg total) by mouth daily. 09/11/18  Yes TatOnalee Hua, MD  clopidogrel (PLAVIX) 75 MG tablet Take 1 tablet (75 mg total) by mouth daily with breakfast. 05/03/15  Yes Hilty, Lisette Abu, MD  empagliflozin (JARDIANCE) 25 MG TABS tablet Take 25 mg by mouth daily.  Yes [provider]  gabapentin (NEURONTIN) 300 MG capsule Take 300 mg by mouth 3 (three) times daily. 08/19/18  Yes [provider]  GLIMEPIRIDE PO Take 1 tablet by mouth in the morning and at bedtime.   Yes [provider]  HUMALOG MIX 75/25 KWIKPEN (75-25) 100 UNIT/ML Kwikpen Inject 50 Units into the skin in the morning and at bedtime. 09/07/18  Yes [provider]  isosorbide mononitrate (IMDUR) 30 MG 24 hr tablet Take 1 tablet (30 mg total) by mouth daily. 04/25/22  Yes Runell Gess, MD  LANTUS SOLOSTAR 100 UNIT/ML Solostar Pen Inject 50 Units into the skin 2 (two) times daily. 09/08/19  Yes [provider]  losartan (COZAAR) 50 MG tablet Take 50 mg by mouth daily. 04/27/22  Yes [provider]  Loteprednol Etabonate (EYSUVIS) 0.25 % SUSP Place 1 drop into both  eyes at bedtime as needed (Dry Eye).   Yes [provider]  metoprolol succinate (TOPROL-XL) 25 MG 24 hr tablet TAKE 1 TABLET BY MOUTH ONCE A DAY. Patient taking differently: Take 25 mg by mouth daily. 09/26/22  Yes Hilty, Lisette Abu, MD  oxyCODONE-acetaminophen (PERCOCET) 10-325 MG tablet Take 1 tablet by mouth 4 (four) times daily as needed for pain. 02/20/21  Yes [provider]  PROAIR HFA 108 (90 BASE) MCG/ACT inhaler Inhale 2 puffs into the lungs every 6 (six) hours as needed for wheezing or shortness of breath.  08/14/13  Yes [provider]  rosuvastatin (CRESTOR) 40 MG tablet Take 1 tablet (40 mg total) by mouth daily. 09/10/18  Yes Tat, Onalee Hua, MD  zolpidem (AMBIEN CR) 12.5 MG CR tablet Take 12.5 mg by mouth at bedtime. 06/15/20  Yes [provider]  Blood Glucose Monitoring Suppl (ONETOUCH VERIO FLEX SYSTEM) w/Device KIT  11/21/20   [provider]  carboxymethylcellulose (REFRESH PLUS) 0.5 % SOLN Place 1 drop into both eyes 3 (three) times daily as needed (dry eyes).    [provider]  cyclobenzaprine (FLEXERIL) 10 MG tablet Take 1 tablet (10 mg total) by mouth 3 (three) times daily as needed for muscle spasms. 10/31/17   Tressie Stalker, MD  Lancets [facility] DELICA PLUS [place]) MISC Apply topically daily. 02/07/21   [provider]  Lancets (ONETOUCH DELICA PLUS [place]) MISC Apply topically 2 (two) times daily. 11/21/20   [provider]  Na Sulfate-K Sulfate-Mg Sulf 17.5-3.13-1.6 GM/177ML SOLN As directed 09/19/22   Rourk, Gerrit Friends, MD  [facility] VERIO test strip 2 (two) times daily. 11/21/20   [provider]  Oxycodone HCl 10 MG TABS Take 10 mg by mouth every 6 (six) hours as needed (Pain). 09/10/22   [provider]    Allergies  No Known Allergies  Review of Systems  ROS  Neurologic Exam  Awake, alert, oriented Memory and concentration grossly intact Speech fluent, appropriate CN grossly  intact Motor exam: Upper Extremities Deltoid Bicep Tricep Grip  Right 5/5 5/5 5/5 5/5  Left 5/5 5/5 5/5 5/5   Lower Extremities IP Quad PF DF EHL  Right 5/5 5/5 5/5 5/5 5/5  Left 5/5 5/5 5/5 5/5 5/5   Sensation grossly intact to LT  Impression  - 62 y.o. male 51mo s/p coil embolization of RMCA aneurysm, doing well  Plan  - Will proceed with f/u angiogram  I have reviewed the indications for the procedure as well as the details of the procedure and the expected postoperative course and recovery at length with the patient in  the office. We have also reviewed in detail the risks, benefits, and alternatives to the procedure. All questions were answered and August Luz provided informed consent to proceed.  Lisbeth Renshaw, MD [facility]

## 2022-12-04 NOTE — Progress Notes (Signed)
Called Dr Conchita Paris and notified no medication reconciliation done and per Dr Conchita Paris client and spouse notified to resume plavix and aspirin tomorrow and they voiced understanding

## 2022-12-04 NOTE — Discharge Instructions (Addendum)
RESUME PLAVIX AND ASPIRIN TOMORROW

## 2022-12-06 NOTE — Progress Notes (Signed)
Sent message, via epic in basket, requesting orders in epic from surgeon.  

## 2022-12-11 NOTE — Patient Instructions (Addendum)
SURGICAL WAITING ROOM VISITATION  Patients having surgery or a procedure may have no more than 2 support people in the waiting area - these visitors may rotate.    Children under the age of 92 must have an adult with them who is not the patient.  Due to an increase in RSV and influenza rates and associated hospitalizations, children ages 10 and under may not visit patients in [facility] hospitals.  If the patient needs to stay at the hospital during part of their recovery, the visitor guidelines for inpatient rooms apply. Pre-op nurse will coordinate an appropriate time for 1 support person to accompany patient in pre-op.  This support person may not rotate.    Please refer to the [facility] website for the visitor guidelines for Inpatients (after your surgery is over and you are in a regular room).       Your procedure is scheduled on: 12/25/22   Report to [facility] Main Entrance    Report to admitting at 6:45 AM   Call this number if you have problems the morning of surgery [phone]   Do not eat food :After Midnight.   After Midnight you may have the following liquids until 6:15 AM DAY OF SURGERY  Water Non-Citrus Juices (without pulp, NO RED-Apple, White grape, White cranberry) Black Coffee (NO MILK/CREAM OR CREAMERS, sugar ok)  Clear Tea (NO MILK/CREAM OR CREAMERS, sugar ok) regular and decaf                             Plain Jell-O (NO RED)                                           Fruit ices (not with fruit pulp, NO RED)                                     Popsicles (NO RED)                                                               Sports drinks like Gatorade (NO RED)                The day of surgery:  Drink ONE (1) Pre-Surgery G2 at 6:15 AM the morning of surgery. Drink in one sitting. Do not sip.  This drink was given to you during your hospital  pre-op appointment visit. Nothing else to drink after completing the  Pre-Surgery G2.          If  you have questions, please contact your surgeon's office.   FOLLOW BOWEL PREP AND ANY ADDITIONAL PRE OP INSTRUCTIONS YOU RECEIVED FROM YOUR SURGEON'S OFFICE!!!     Oral Hygiene is also important to reduce your risk of infection.                                    Remember - BRUSH YOUR TEETH THE MORNING OF SURGERY WITH YOUR REGULAR TOOTHPASTE  DENTURES WILL  BE REMOVED PRIOR TO SURGERY PLEASE DO NOT APPLY "Poly grip" OR ADHESIVES!!!   Do NOT smoke after Midnight   Take these medicines the morning of surgery with A SIP OF WATER: Tylenol, Gabapentin, IMDUR, Metoprolol, Percocet, Rosuvastatin, Inhalers, eye drops  These are anesthesia recommendations for holding your anticoagulants.  Please contact your prescribing physician to confirm IF it is safe to hold your anticoagulants for this length of time.   Eliquis Apixaban   72 hours   Xarelto Rivaroxaban   72 hours  Plavix Clopidogrel   120 hours  Pletal Cilostazol   120 hours    DO NOT TAKE ANY ORAL DIABETIC MEDICATIONS DAY OF YOUR SURGERY How to Manage Your Diabetes Before and After Surgery  Why is it important to control my blood sugar before and after surgery? Improving blood sugar levels before and after surgery helps healing and can limit problems. A way of improving blood sugar control is eating a healthy diet by:  Eating less sugar and carbohydrates  Increasing activity/exercise  Talking with your doctor about reaching your blood sugar goals High blood sugars (greater than 180 mg/dL) can raise your risk of infections and slow your recovery, so you will need to focus on controlling your diabetes during the weeks before surgery. Make sure that the doctor who takes care of your diabetes knows about your planned surgery including the date and location.  How do I manage my blood sugar before surgery? Check your blood sugar at least 4 times a day, starting 2 days before surgery, to make sure that the level is not too high or  low. Check your blood sugar the morning of your surgery when you wake up and every 2 hours until you get to the Short Stay unit. If your blood sugar is less than 70 mg/dL, you will need to treat for low blood sugar: Do not take insulin. Treat a low blood sugar (less than 70 mg/dL) with  cup of clear juice (cranberry or apple), 4 glucose tablets, OR glucose gel. Recheck blood sugar in 15 minutes after treatment (to make sure it is greater than 70 mg/dL). If your blood sugar is not greater than 70 mg/dL on recheck, call [phone] for further instructions. Report your blood sugar to the short stay nurse when you get to Short Stay.  If you are admitted to the hospital after surgery: Your blood sugar will be checked by the staff and you will probably be given insulin after surgery (instead of oral diabetes medicines) to make sure you have good blood sugar levels. The goal for blood sugar control after surgery is 80-180 mg/dL.   WHAT DO I DO ABOUT MY DIABETES MEDICATION?  Do not take oral diabetes medicines (pills) the morning of surgery.   Hold Jardiance for 3 days prior to surgery. Last dose should be 12/21/22.  THE day BEFORE SURGERY, take morning dose of Lantus as prescribed, Evening dose take 50%. Humalog take usual dose before meal but no bedtime dose. Glimeperide take only morning dose. Do not take evening dose.       THE MORNING OF SURGERY,Lantus take 50% of dose. Humalog only take if blood sugar greater than 220, then only half of dose.Hold Glimeperide day of surgery.  If your CBG is greater than 220 mg/dL, you may take  of your sliding scale  (correction) dose of insulin.    Reviewed and Endorsed by [facility] Patient Education Committee, August 2015  You may not have any metal on your body including hair pins, jewelry, and body piercing             Do not wear make-up, lotions, powders, perfumes/cologne, or deodorant              Men may shave face  and neck.   Do not bring valuables to the hospital.  IS NOT             RESPONSIBLE   FOR VALUABLES.   Contacts, glasses, dentures or bridgework may not be worn into surgery     DO NOT BRING YOUR HOME MEDICATIONS TO THE HOSPITAL. PHARMACY WILL DISPENSE MEDICATIONS LISTED ON YOUR MEDICATION LIST TO YOU DURING YOUR ADMISSION IN THE HOSPITAL!    Patients discharged on the day of surgery will not be allowed to drive home.  Someone NEEDS to stay with you for the first 24 hours after anesthesia.              Please read over the following fact sheets you were given: IF YOU HAVE QUESTIONS ABOUT YOUR PRE-OP INSTRUCTIONS PLEASE CALL [phone] Contras    If you received a COVID test during your pre-op visit  it is requested that you wear a mask when out in public, stay away from anyone that may not be feeling well and notify your surgeon if you develop symptoms. If you test positive for Covid or have been in contact with anyone that has tested positive in the last 10 days please notify you surgeon.    Incentive Spirometer (Watch this video at home: [email or web address])  An incentive spirometer is a tool that can help keep your lungs clear and active. This tool measures how well you are filling your lungs with each breath. Taking long deep breaths may help reverse or decrease the chance of developing breathing (pulmonary) problems (especially infection) following: A long period of time when you are unable to move or be active. BEFORE THE PROCEDURE  If the spirometer includes an indicator to show your best effort, your nurse or respiratory therapist will set it to a desired goal. If possible, sit up straight or lean slightly forward. Try not to slouch. Hold the incentive spirometer in an upright position. INSTRUCTIONS FOR USE  Sit on the edge of your bed if possible, or sit up as far as you can in bed or on a chair. Hold the incentive spirometer in an upright  position. Breathe out normally. Place the mouthpiece in your mouth and seal your lips tightly around it. Breathe in slowly and as deeply as possible, raising the piston or the ball toward the top of the column. Hold your breath for 3-5 seconds or for as long as possible. Allow the piston or ball to fall to the bottom of the column. Remove the mouthpiece from your mouth and breathe out normally. Rest for a few seconds and repeat Steps 1 through 7 at least 10 times every 1-2 hours when you are awake. Take your time and take a few normal breaths between deep breaths. The spirometer may include an indicator to show your best effort. Use the indicator as a goal to work toward during each repetition. After each set of 10 deep breaths, practice coughing to be sure your lungs are clear. If you have an incision (the cut made at the time of surgery), support your incision when coughing by placing a pillow or rolled up towels firmly against it. Once you are able  to get out of bed, walk around indoors and cough well. You may stop using the incentive spirometer when instructed by your caregiver.  RISKS AND COMPLICATIONS Take your time so you do not get dizzy or light-headed. If you are in pain, you may need to take or ask for pain medication before doing incentive spirometry. It is harder to take a deep breath if you are having pain. AFTER USE Rest and breathe slowly and easily. It can be helpful to keep track of a log of your progress. Your caregiver can provide you with a simple table to help with this. If you are using the spirometer at home, follow these instructions: SEEK MEDICAL CARE IF:  You are having difficultly using the spirometer. You have trouble using the spirometer as often as instructed. Your pain medication is not giving enough relief while using the spirometer. You develop fever of 100.5 F (38.1 C) or higher. SEEK IMMEDIATE MEDICAL CARE IF:  You cough up bloody sputum that had not been  present before. You develop fever of 102 F (38.9 C) or greater. You develop worsening pain at or near the incision site. MAKE SURE YOU:  Understand these instructions. Will watch your condition. Will get help right away if you are not doing well or get worse. Document Released: 12/03/2006 Document Revised: 10/15/2011 Document Reviewed: 02/03/2007 [facility] Patient Information 2014 [place], [place]. How to Manage Your Diabetes Before and After Surgery  Why is it important to control my blood sugar before and after surgery? Improving blood sugar levels before and after surgery helps healing and can limit problems. A way of improving blood sugar control is eating a healthy diet by:  Eating less sugar and carbohydrates  Increasing activity/exercise  Talking with your doctor about reaching your blood sugar goals High blood sugars (greater than 180 mg/dL) can raise your risk of infections and slow your recovery, so you will need to focus on controlling your diabetes during the weeks before surgery. Make sure that the doctor who takes care of your diabetes knows about your planned surgery including the date and location.  How do I manage my blood sugar before surgery? Check your blood sugar at least 4 times a day, starting 2 days before surgery, to make sure that the level is not too high or low. Check your blood sugar the morning of your surgery when you wake up and every 2 hours until you get to the Short Stay unit. If your blood sugar is less than 70 mg/dL, you will need to treat for low blood sugar: Do not take insulin. Treat a low blood sugar (less than 70 mg/dL) with  cup of clear juice (cranberry or apple), 4 glucose tablets, OR glucose gel. Recheck blood sugar in 15 minutes after treatment (to make sure it is greater than 70 mg/dL). If your blood sugar is not greater than 70 mg/dL on recheck, call [phone] for further instructions. Report your blood sugar to the short stay nurse  when you get to Short Stay.  If you are admitted to the hospital after surgery: Your blood sugar will be checked by the staff and you will probably be given insulin after surgery (instead of oral diabetes medicines) to make sure you have good blood sugar levels. The goal for blood sugar control after surgery is 80-180 mg/dL.

## 2022-12-11 NOTE — Progress Notes (Signed)
COVID Vaccine received:  [x]  No []  Yes Date of any COVID positive Test in last 16 days:No  PCP - [name] NP Cardiologist - Zoila Shutter , Nanetta Batty MD Neurosurgery- [facility]   Chest x-ray - 12/16/20 EPIC EKG -  11/20/22 EPIC Stress Test - 09/10/18 EPIC ECHO - 09/10/18 EPIC Cardiac Cath - yes  Bowel Prep - [x]  No  []   Yes ______  Pacemaker / ICD device [x]  No []  Yes   Spinal Cord Stimulator:[x]  No []  Yes       History of Sleep Apnea? [x]  No []  Yes   CPAP used?- [x]  No []  Yes    Does the patient monitor blood sugar?          []  No [x]  Yes  []  N/A  Patient has: []  NO Hx DM   []  Pre-DM                 []  DM1  [x]   DM2 Does patient have a [employer] or Dexacom? [x]  No []  Yes   Fasting Blood Sugar Ranges- 75-112 Checks Blood Sugar __3-4 times a day  GLP1 agonist / usual dose - N/A GLP1 instructions:  SGLT-2 inhibitors / usual dose - N/A SGLT-2 instructions:   Blood Thinner / Instructions:   PA for Dr.Nundkamar instructed pt. To stop Plavix 5 days prior to surgery.Last dose 12/19/22. 8am .And continur ASA 81mg  daily. Comments:   Activity level: Patient is able / unable to climb a flight of stairs without difficulty; [x]  No CP  [x]  No SOB, but would have ___   Patient can perform ADLs without assistance.   Anesthesia review: Hx. CABG, HTN, CAD,PVD, DM, Brain coil  Patient denies shortness of breath, fever, cough and chest pain at PAT appointment.  Patient verbalized understanding and agreement to the Pre-Surgical Instructions that were given to them at this PAT appointment. Patient was also educated of the need to review these PAT instructions again prior to his/her surgery.I reviewed the appropriate phone numbers to call if they have any and questions or concerns.

## 2022-12-12 DIAGNOSIS — M1611 Unilateral primary osteoarthritis, right hip: Secondary | ICD-10-CM | POA: Diagnosis not present

## 2022-12-12 NOTE — Progress Notes (Addendum)
COVID Vaccine Completed:  Date of COVID positive in last 90 days:  PCP - [name], NP Cardiologist - Zoila Shutter, MD Neurosurgery- [facility]  Cardiac clearance by Bernadene Person, NP 09/12/22 in Epic   Chest x-ray - CT 08/15/22 Epic EKG - 11/20/22 Epic Stress Test - 2020 ECHO - 2020 Cardiac Cath - 2014 Pacemaker/ICD device last checked: Spinal Cord Stimulator:  Bowel Prep -   Sleep Study -  CPAP -   Fasting Blood Sugar -  Checks Blood Sugar _____ times a day  Last dose of GLP1 agonist-  N/A GLP1 instructions:  N/A   Last dose of SGLT-2 inhibitors-  N/A SGLT-2 instructions: N/A   Blood Thinner Instructions:  Plavix need to come from neurosurgery due to cerebral embolism. Per cards okay to hold 5 days Aspirin Instructions: ASA 81 ^^. Per cards prefer to stay on, could hold 5-7 Last Dose:  Activity level:  Can go up a flight of stairs and perform activities of daily living without stopping and without symptoms of chest pain or shortness of breath.  Able to exercise without symptoms  Unable to go up a flight of stairs without symptoms of     Anesthesia review: CAD, HTN, PVD, DM2, CABG x4, COPD  Patient denies shortness of breath, fever, cough and chest pain at PAT appointment  Patient verbalized understanding of instructions that were given to them at the PAT appointment. Patient was also instructed that they will need to review over the PAT instructions again at home before surgery.

## 2022-12-13 ENCOUNTER — Encounter (HOSPITAL_COMMUNITY)
Admission: RE | Admit: 2022-12-13 | Discharge: 2022-12-13 | Disposition: A | Discharge status: Home / Self Care | Payer: 59 | Source: Ambulatory Visit | Attending: Orthopedic Surgery | Admitting: Orthopedic Surgery

## 2022-12-13 ENCOUNTER — Encounter (HOSPITAL_COMMUNITY): Payer: Self-pay

## 2022-12-13 ENCOUNTER — Encounter (HOSPITAL_COMMUNITY): Payer: 59

## 2022-12-13 ENCOUNTER — Other Ambulatory Visit: Payer: Self-pay

## 2022-12-13 VITALS — BP 166/75 | HR 73 | Temp 98.4°F | Resp 18 | Ht 71.0 in | Wt 193.0 lb

## 2022-12-13 DIAGNOSIS — E785 Hyperlipidemia, unspecified: Secondary | ICD-10-CM | POA: Diagnosis not present

## 2022-12-13 DIAGNOSIS — Z794 Long term (current) use of insulin: Secondary | ICD-10-CM | POA: Insufficient documentation

## 2022-12-13 DIAGNOSIS — I10 Essential (primary) hypertension: Secondary | ICD-10-CM | POA: Diagnosis not present

## 2022-12-13 DIAGNOSIS — I671 Cerebral aneurysm, nonruptured: Secondary | ICD-10-CM | POA: Diagnosis not present

## 2022-12-13 DIAGNOSIS — E1151 Type 2 diabetes mellitus with diabetic peripheral angiopathy without gangrene: Secondary | ICD-10-CM | POA: Insufficient documentation

## 2022-12-13 DIAGNOSIS — K219 Gastro-esophageal reflux disease without esophagitis: Secondary | ICD-10-CM | POA: Diagnosis not present

## 2022-12-13 DIAGNOSIS — Z01818 Encounter for other preprocedural examination: Secondary | ICD-10-CM

## 2022-12-13 DIAGNOSIS — Z951 Presence of aortocoronary bypass graft: Secondary | ICD-10-CM | POA: Diagnosis not present

## 2022-12-13 DIAGNOSIS — Z01812 Encounter for preprocedural laboratory examination: Secondary | ICD-10-CM | POA: Diagnosis not present

## 2022-12-13 DIAGNOSIS — J449 Chronic obstructive pulmonary disease, unspecified: Secondary | ICD-10-CM | POA: Insufficient documentation

## 2022-12-13 DIAGNOSIS — M1611 Unilateral primary osteoarthritis, right hip: Secondary | ICD-10-CM | POA: Diagnosis not present

## 2022-12-13 DIAGNOSIS — I2510 Atherosclerotic heart disease of native coronary artery without angina pectoris: Secondary | ICD-10-CM | POA: Diagnosis not present

## 2022-12-13 DIAGNOSIS — E119 Type 2 diabetes mellitus without complications: Secondary | ICD-10-CM

## 2022-12-13 LAB — HEMOGLOBIN A1C
Hgb A1c MFr Bld: 7.2 % — ABNORMAL HIGH (ref 4.8–5.6)
Mean Plasma Glucose: 159.94 mg/dL

## 2022-12-13 LAB — SURGICAL PCR SCREEN
MRSA, PCR: NEGATIVE
Staphylococcus aureus: NEGATIVE

## 2022-12-13 LAB — GLUCOSE, CAPILLARY: Glucose-Capillary: 208 mg/dL — ABNORMAL HIGH (ref 70–99)

## 2022-12-14 NOTE — Progress Notes (Signed)
Anesthesia Chart Review   Case: [id number] Date/Time: 12/25/22 0859   Procedure: TOTAL HIP ARTHROPLASTY (Right: Hip)   Anesthesia type: Spinal   Pre-op diagnosis: djd right hip   Location: [facility] / [facility]   Surgeons: Teryl Lucy, MD       DISCUSSION:62 y.o. smoker with h/o GERD, COPD, DM type II, PVD status post right SFA CTO, CAD CABG x4 2014, cerebral artery aneurysm, DJD right hip scheduled for above procedure 12/25/2022 with Dr. Teryl Lucy.  Case previously canceled due to elevated A1C.  A1c is now 7.2.  Per previous anesthesia chart review note: Per cardiology preoperative evaluation 09/12/2022, "According to the Revised Cardiac Risk Index (RCRI), his Perioperative Risk of Major Cardiac Event is (%): 11. His Functional Capacity in METs is: 4.73 according to the Duke Activity Status Index (DASI).Therefore, based on ACC/AHA guidelines, patient would be at acceptable risk for the planned procedure without further cardiovascular testing.    The patient was advised that if he develops new symptoms prior to surgery to contact our office to arrange for a follow-up visit, and he verbalized understanding.  Per Dr. Allyson Sabal, from a cardiac standpoint, he may hold Plavix for 5 days prior to procedure. Please resume Plavix as soon as possible postprocedure, at the discretion of the surgeon. Regarding ASA therapy, we recommend continuation of ASA throughout the perioperative period.  However, if the surgeon feels that cessation of ASA is required in the perioperative period, it may be stopped 5-7 days prior to surgery with a plan to resume it as soon as felt to be feasible from a surgical standpoint in the post-operative period.  However, patient is s/p cerebral aneurysm, coil embolization in 05/2022.  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  Repeat angio  done 12/04/2022 with complete occlusion of small RMCA aneurysm 6 months after coil embolization.   Pt advised by neurology to hold Plavix 5 days prior to procedure.  Reports last dose 12/19/2022.  VS: BP (!) 166/75   Pulse 73   Temp 36.9 C (Oral)   Resp 18   Ht 5\' 11"  (1.803 m)   Wt 87.5 kg   SpO2 93%   BMI 26.92 kg/m   PROVIDERS: [name], NP is PCP  Nanetta Batty, MD is cardiologist LABS: Labs reviewed: Acceptable for surgery. (all labs ordered are listed, but only abnormal results are displayed)  Labs Reviewed  HEMOGLOBIN A1C - Abnormal; Notable for the following components:      Result Value   Hgb A1c MFr Bld 7.2 (*)    All other components within normal limits  GLUCOSE, CAPILLARY - Abnormal; Notable for the following components:   Glucose-Capillary 208 (*)    All other components within normal limits  SURGICAL PCR SCREEN     IMAGES:   EKG:   CV: Echo 09/10/2018  1. The left ventricle has normal systolic function of 60-65%. The cavity  size is normal. There is borderline increase in left ventricular wall  thickness. Echo evidence of normal diastolic relaxation.   2. The right ventricle has normal systolic function. The cavity in normal  in size. There is no increase in right ventricular wall thickness. Right  ventricular systolic pressure could not be assessed.   3. The aortic valve is tricuspid. There is mild aortic annular  calcification noted.   4. The mitral valve is normal in  structure.   5. The pulmonic valve is grossly normal.  Past Medical History:  Diagnosis Date   Arthritis    "some; in hips sometimes" (01/02/2013)   Back pain    "w/prolonged walking or standing" (01/02/2013)   CAD (coronary artery disease)    CABG X 4 08/2012   COPD (chronic obstructive pulmonary disease) (HCC)    Elevated PSA    Esophageal erosions 05/24/2001   egd by Dr. Jena Gauss   GERD (gastroesophageal reflux disease)    H/O hiatal hernia    Headache    History of  gout    Hyperlipidemia    Hypertension    not on any medication for htn at present   PAD (peripheral artery disease) (HCC)    LEA DOPPLER, 11/11/2012 - moderate arterial insufficiency to lower extremities at rest, BILATERAL SFA-demonstrates occlusive disease with reconstitution of flow   S/P percutaneous transluminal angioplasty (PTA) with stent placement 03/29/21 03/31/2021   Tobacco abuse    Tubular adenoma of colon 08/28/2011   Type II diabetes mellitus (HCC)    Unintentional weight loss     Past Surgical History:  Procedure Laterality Date   ABDOMINAL AORTOGRAM W/LOWER EXTREMITY N/A 03/30/2021   Procedure: ABDOMINAL AORTOGRAM W/LOWER EXTREMITY;  Surgeon: Runell Gess, MD;  Location: [facility] INVASIVE CV LAB;  Service: Cardiovascular;  Laterality: N/A;   ABDOMINAL AORTOGRAM W/LOWER EXTREMITY N/A 04/12/2022   Procedure: ABDOMINAL AORTOGRAM W/LOWER EXTREMITY;  Surgeon: Runell Gess, MD;  Location: [facility] INVASIVE CV LAB;  Service: Cardiovascular;  Laterality: N/A;   ANTERIOR CERVICAL DECOMP/DISCECTOMY FUSION  12/19/2011   Procedure: ANTERIOR CERVICAL DECOMPRESSION/DISCECTOMY FUSION 2 LEVELS;  Surgeon: Karn Cassis, MD;  Location: [facility] NEURO ORS;  Service: Neurosurgery;  Laterality: N/A;  Cervical four-five Cervical five-six  Anterior cervical decompression/diskectomy, fusion, plate   ATHERECTOMY N/A 01/02/2013   Procedure: ATHERECTOMY;  Surgeon: Runell Gess, MD;  Location: [facility] CATH LAB;  Service: Cardiovascular;  Laterality: N/A;   BACK SURGERY     x3   CARDIAC CATHETERIZATION  08/19/2012   CABG warranted; Severe ostial LCx stenosis with mid to distal vessel occlusion   COLONOSCOPY W/ POLYPECTOMY  08/28/11   Rourk-tubular adenomas removed from ascending colon, suboptimal prep, diminutive rectal polyps   COLONOSCOPY WITH PROPOFOL N/A 06/13/2017   Procedure: COLONOSCOPY WITH PROPOFOL;  Surgeon: Corbin Ade, MD;  Location: [facility]O [facility];  Service: Endoscopy;  Laterality: N/A;  9:15am    CORONARY ARTERY BYPASS GRAFT  08/22/2012   Procedure: CORONARY ARTERY BYPASS GRAFTING (CABG);  Surgeon: Delight Ovens, MD;  Location: [facility] OR;  Service: Open Heart Surgery;  Laterality: N/A;  CABG x four,  using left internal mammary artery and right leg greater saphenous vein harvested endoscopically   ESOPHAGOGASTRODUODENOSCOPY  05/24/01   by Dr. Jena Gauss   ESOPHAGOGASTRODUODENOSCOPY  08/28/11   Rourk-erosive reflux esophagitis, gastric erosions   ESOPHAGOGASTRODUODENOSCOPY (EGD) WITH PROPOFOL N/A 06/13/2017   Procedure: ESOPHAGOGASTRODUODENOSCOPY (EGD) WITH PROPOFOL;  Surgeon: Corbin Ade, MD;  Location: [facility];  Service: Endoscopy;  Laterality: N/A;   INTRAOPERATIVE TRANSESOPHAGEAL ECHOCARDIOGRAM  08/22/2012   Procedure: INTRAOPERATIVE TRANSESOPHAGEAL ECHOCARDIOGRAM;  Surgeon: Delight Ovens, MD;  Location: [facility] OR;  Service: Open Heart Surgery;  Laterality: N/A;   IR 3D INDEPENDENT WKST  05/17/2022   IR ANGIO INTRA EXTRACRAN SEL INTERNAL CAROTID UNI R MOD SED  05/17/2022   IR ANGIO INTRA EXTRACRAN SEL INTERNAL CAROTID UNI R MOD SED  12/04/2022   IR ANGIOGRAM FOLLOW UP STUDY  05/17/2022   IR NEURO EACH ADD'L AFTER BASIC UNI RIGHT (MS)  05/17/2022   IR TRANSCATH/EMBOLIZ  05/17/2022   IR US GUIDE VASC ACCESS RIGHT  05/17/2022   IR US GUIDE VASC ACCESS RIGHT  12/04/2022   LEFT HEART CATHETERIZATION WITH CORONARY ANGIOGRAM N/A 08/19/2012   Procedure: LEFT HEART CATHETERIZATION WITH CORONARY ANGIOGRAM;  Surgeon: Chrystie Nose, MD;  Location: [facility] CATH LAB;  Service: Cardiovascular;  Laterality: N/A;   LOWER EXTREMITY ANGIOGRAM N/A 11/17/2012   Procedure: LOWER EXTREMITY ANGIOGRAM;  Surgeon: Runell Gess, MD;  Location: [facility] CATH LAB;  Service: Cardiovascular;  Laterality: N/A;   LOWER EXTREMITY ANGIOGRAM N/A 02/02/2013   Procedure: LOWER EXTREMITY ANGIOGRAM;  Surgeon: Runell Gess, MD;  Location: [facility] CATH LAB;  Service: Cardiovascular;  Laterality: N/A;   LUMBAR DISC SURGERY      LUMBAR FUSION  ~ 2011   PELVIC ABCESS DRAINAGE     x2   PERIPHERAL VASCULAR ATHERECTOMY  03/30/2021   Procedure: PERIPHERAL VASCULAR ATHERECTOMY;  Surgeon: Runell Gess, MD;  Location: [facility] INVASIVE CV LAB;  Service: Cardiovascular;;  left common iliac artery   PERIPHERAL VASCULAR BALLOON ANGIOPLASTY Left 04/12/2022   Procedure: PERIPHERAL VASCULAR BALLOON ANGIOPLASTY;  Surgeon: Runell Gess, MD;  Location: [facility] INVAS[facility] CV LAB;  Service: Cardiovascular;  Laterality: Left;  SFA   PERIPHERAL VASCULAR INTERVENTION  03/30/2021   Procedure: PERIPHERAL VASCULAR INTERVENTION;  Surgeon: Runell Gess, MD;  Location: [facility] INVASIVE CV LAB;  Service: Cardiovascular;;   POLYPECTOMY  06/13/2017   Procedure: POLYPECTOMY;  Surgeon: Corbin Ade, MD;  Location: [facility];  Service: Endoscopy;;  colon   RADIOLOGY WITH ANESTHESIA N/A 05/17/2022   Procedure: Diagnostic Cerebral Angiogram, Possible Aneurysm Coiling, Possible Stent;  Surgeon: Lisbeth Renshaw, MD;  Location: [facility] OR;  Service: Radiology;  Laterality: N/A;   SHOULDER SURGERY Right    VASCULAR SURGERY Right 01/02/2013   diamondback orbital rotational  atherectomy, chocolate balloon and IDEV  Stent.    MEDICATIONS:  acetaminophen (TYLENOL) 650 MG CR tablet   aspirin EC 81 MG EC tablet   Blood Glucose Monitoring Suppl (ONETOUCH VERIO FLEX SYSTEM) w/Device KIT   carboxymethylcellulose (REFRESH PLUS) 0.5 % SOLN   clopidogrel (PLAVIX) 75 MG tablet   cyclobenzaprine (FLEXERIL) 10 MG tablet   empagliflozin (JARDIANCE) 25 MG TABS tablet   gabapentin (NEURONTIN) 300 MG capsule   GLIMEPIRIDE PO   HUMALOG MIX 75/25 KWIKPEN (75-25) 100 UNIT/ML Kwikpen   isosorbide mononitrate (IMDUR) 30 MG 24 hr tablet   Lancets (ONETOUCH DELICA PLUS LANCET33G) MISC   Lancets (ONETOUCH DELICA PLUS LANCET33G) MISC   LANTUS SOLOSTAR 100 UNIT/ML Solostar Pen   losartan (COZAAR) 50 MG tablet   Loteprednol Etabonate (EYSUVIS) 0.25 % SUSP   metoprolol succinate  (TOPROL-XL) 25 MG 24 hr tablet   Na Sulfate-K Sulfate-Mg Sulf 17.5-3.13-1.6 GM/177ML SOLN   ONETOUCH VERIO test strip   Oxycodone HCl 10 MG TABS   oxyCODONE-acetaminophen (PERCOCET) 10-325 MG tablet   PROAIR HFA 108 (90 BASE) MCG/ACT inhaler   rosuvastatin (CRESTOR) 40 MG tablet   zolpidem (AMBIEN CR) 12.5 MG CR tablet   No current facility-administered medications for this encounter.   Jodell Cipro Ward, PA-C [facility] Pre-Surgical Testing [phone] n

## 2022-12-17 NOTE — Care Plan (Signed)
Ortho Bundle Case Management Note  Patient Details  Name: [name] MRN: [id number] Date of Birth: [birth year]  met with patient and wife in the office for H&P. he will discharge to home with family to help. rolling walker ordered for home use. OPPT set up with [facility]. discharge instructions discussed and questions answered. appointments confirmed. Patient and MD in agreement with plan. Choice offered                     DME Arranged:  Walker rolling DME Agency:  [employer]  HH Arranged:    HH Agency:     Additional Comments: Please contact me with any questions of if this plan should need to change.  Shauna Hugh,  RN,BSN,MHA,CCM  [facility] Orthopaedic Specialist  [phone] 12/17/2022, 12:00 PM

## 2022-12-18 DIAGNOSIS — M5137 Other intervertebral disc degeneration, lumbosacral region: Secondary | ICD-10-CM | POA: Diagnosis not present

## 2022-12-18 DIAGNOSIS — Z794 Long term (current) use of insulin: Secondary | ICD-10-CM | POA: Diagnosis not present

## 2022-12-18 DIAGNOSIS — E1100 Type 2 diabetes mellitus with hyperosmolarity without nonketotic hyperglycemic-hyperosmolar coma (NKHHC): Secondary | ICD-10-CM | POA: Diagnosis not present

## 2022-12-19 ENCOUNTER — Ambulatory Visit: Payer: 59 | Admitting: Cardiovascular Disease

## 2022-12-19 NOTE — Anesthesia Preprocedure Evaluation (Addendum)
Anesthesia Evaluation  Patient identified by MRN, date of birth, ID band Patient awake    Reviewed: Allergy & Precautions, H&P , NPO status , Patient's Chart, lab work & pertinent test results  Airway Mallampati: II   Neck ROM: full    Dental   Pulmonary COPD, Current Smoker   breath sounds clear to auscultation       Cardiovascular hypertension, + CAD, + CABG and + Peripheral Vascular Disease   Rhythm:regular Rate:Normal     Neuro/Psych  Headaches Multiple lumbar spine surgeries.  05/2022 - R MCA aneurysm coiling.  Takes plavix (last dose 12/19/22).    GI/Hepatic hiatal hernia,GERD  ,,  Endo/Other  diabetes, Type 2    Renal/GU      Musculoskeletal  (+) Arthritis ,    Abdominal   Peds  Hematology   Anesthesia Other Findings   Reproductive/Obstetrics                             Anesthesia Physical Anesthesia Plan  ASA: 3  Anesthesia Plan: General   Post-op Pain Management:    Induction: Intravenous  PONV Risk Score and Plan: 1 and Ondansetron, Dexamethasone, Midazolam and Treatment may vary due to age or medical condition  Airway Management Planned: Oral ETT  Additional Equipment:   Intra-op Plan:   Post-operative Plan: Extubation in OR  Informed Consent: I have reviewed the patients History and Physical, chart, labs and discussed the procedure including the risks, benefits and alternatives for the proposed anesthesia with the patient or authorized representative who has indicated his/her understanding and acceptance.     Dental advisory given  Plan Discussed with: CRNA, Anesthesiologist and Surgeon  Anesthesia Plan Comments: (See PAT note 12/13/2022)       Anesthesia Quick Evaluation

## 2022-12-24 NOTE — H&P (Signed)
HIP ARTHROPLASTY ADMISSION H&P  Patient ID: SUJAN BAZER MRN: [id number] DOB/AGE: 10-20-1960 62 y.o.  Chief Complaint: right hip pain.  Planned Procedure Date: 12/25/22 Medical Clearance by [name], NP Cardiac Clearance by Bernadene Person, NP Additional clearance by Dr. Conchita Paris (neurosurgery)  HPI: OSSIAN LIEBOLD is a 62 y.o. male who presents for evaluation of djd right hip. The patient has a history of pain and functional disability in the right hip due to arthritis and has failed non-surgical conservative treatments for greater than 12 weeks to include NSAID's and/or analgesics, corticosteriod injections, and activity modification.  Onset of symptoms was gradual, starting 2 years ago with gradually worsening course since that time. The patient noted no past surgery on the right hip.  Patient currently rates pain at 7 out of 10 with activity. Patient has worsening of pain with activity and weight bearing and pain that interferes with activities of daily living.  Patient has evidence of joint space narrowing by imaging studies.  There is no active infection.  Past Medical History:  Diagnosis Date   Arthritis    "some; in hips sometimes" (01/02/2013)   Back pain    "w/prolonged walking or standing" (01/02/2013)   CAD (coronary artery disease)    CABG X 4 08/2012   COPD (chronic obstructive pulmonary disease) (HCC)    Elevated PSA    Esophageal erosions 05/24/2001   egd by Dr. Jena Gauss   GERD (gastroesophageal reflux disease)    H/O hiatal hernia    Headache    History of gout    Hyperlipidemia    Hypertension    not on any medication for htn at present   PAD (peripheral artery disease) (HCC)    LEA DOPPLER, 11/11/2012 - moderate arterial insufficiency to lower extremities at rest, BILATERAL SFA-demonstrates occlusive disease with reconstitution of flow   S/P percutaneous transluminal angioplasty (PTA) with stent placement 03/29/21 03/31/2021   Tobacco abuse    Tubular adenoma of  colon 08/28/2011   Type II diabetes mellitus (HCC)    Unintentional weight loss    Past Surgical History:  Procedure Laterality Date   ABDOMINAL AORTOGRAM W/LOWER EXTREMITY N/A 03/30/2021   Procedure: ABDOMINAL AORTOGRAM W/LOWER EXTREMITY;  Surgeon: Runell Gess, MD;  Location: [facility];  Service: Cardiovascular;  Laterality: N/A;   ABDOMINAL AORTOGRAM W/LOWER EXTREMITY N/A 04/12/2022   Procedure: ABDOMINAL AORTOGRAM W/LOWER EXTREMITY;  Surgeon: Runell Gess, MD;  Location: [facility];  Service: Cardiovascular;  Laterality: N/A;   ANTERIOR CERVICAL DECOMP/DISCECTOMY FUSION  12/19/2011   Procedure: ANTERIOR CERVICAL DECOMPRESSION/DISCECTOMY FUSION 2 LEVELS;  Surgeon: Karn Cassis, MD;  Location: [facility]URO OR[facility];  Service: Neurosurgery;  Laterality: N/A;  Cervical four-five Cervical five-six  Anterior cervical decompression/diskectomy, fusion, plate   ATHERECTOMY N/A 01/02/2013   Procedure: ATHERECTOMY;  Surgeon: Runell Gess, MD;  Location: [facility] CATH LAB;  Service: Cardiovascular;  Laterality: N/A;   BACK SURGERY     x3   CARDIAC CATHETERIZATION  08/19/2012   CABG warranted; Severe ostial LCx stenosis with mid to distal vessel occlusion   COLONOSCOPY W/ POLYPECTOMY  08/28/11   Rourk-tubular adenomas removed from ascending colon, suboptimal prep, diminutive rectal polyps   COLONOSCOPY WITH PROPOFOL N/A 06/13/2017   Procedure: COLONOSCOPY WITH PROPOFOL;  Surgeon: Corbin Ade, MD;  Location: [facility];  Service: Endoscopy;  Laterality: N/A;  9:15am   CORONARY ARTERY BYPASS GRAFT  08/22/2012   Procedure: CORONARY ARTERY BYPASS GRAFTING (CABG);  Surgeon: Ramon Dredge  Bari Edward, MD;  Location: [facility] OR;  Service: Open Heart Surgery;  Laterality: N/A;  CABG x four,  using left internal mammary artery and right leg greater saphenous vein harvested endoscopically   ESOPHAGOGASTRODUODENOSCOPY  05/24/01   by Dr. Jena Gauss   ESOPHAGOGASTRODUODENOSCOPY  08/28/11   Rourk-erosive reflux  esophagitis, gastric erosions   ESOPHAGOGASTRODUODENOSCOPY (EGD) WITH PROPOFOL N/A 06/13/2017   Procedure: ESOPHAGOGASTRODUODENOSCOPY (EGD) WITH PROPOFOL;  Surgeon: Corbin Ade, MD;  Location: [facility];  Service: Endoscopy;  Laterality: N/A;   INTRAOPERATIVE TRANSESOPHAGEAL ECHOCARDIOGRAM  08/22/2012   Procedure: INTRAOPERATIVE TRANSESOPHAGEAL ECHOCARDIOGRAM;  Surgeon: Delight Ovens, MD;  Location: [facility] OR;  Service: Open Heart Surgery;  Laterality: N/A;   IR 3D INDEPENDENT WKST  05/17/2022   IR ANGIO INTRA EXTRACRAN SEL INTERNAL CAROTID UNI R MOD SED  05/17/2022   IR ANGIO INTRA EXTRACRAN SEL INTERNAL CAROTID UNI R MOD SED  12/04/2022   IR ANGIOGRAM FOLLOW UP STUDY  05/17/2022   IR NEURO EACH ADD'L AFTER BASIC UNI RIGHT (MS)  05/17/2022   IR TRANSCATH/EMBOLIZ  05/17/2022   IR US GUIDE VASC ACCESS RIGHT  05/17/2022   IR US GUIDE VASC ACCESS RIGHT  12/04/2022   LEFT HEART CATHETERIZATION WITH CORONARY ANGIOGRAM N/A 08/19/2012   Procedure: LEFT HEART CATHETERIZATION WITH CORONARY ANGIOGRAM;  Surgeon: Chrystie Nose, MD;  Location: [facility] CATH LAB;  Service: Cardiovascular;  Laterality: N/A;   LOWER EXTREMITY ANGIOGRAM N/A 11/17/2012   Procedure: LOWER EXTREMITY ANGIOGRAM;  Surgeon: Runell Gess, MD;  Location: [facility] CATH LAB;  Service: Cardiovascular;  Laterality: N/A;   LOWER EXTREMITY ANGIOGRAM N/A 02/02/2013   Procedure: LOWER EXTREMITY ANGIOGRAM;  Surgeon: Runell Gess, MD;  Location: [facility] CATH LAB;  Service: Cardiovascular;  Laterality: N/A;   LUMBAR DISC SURGERY     LUMBAR FUSION  ~ 2011   PELVIC ABCESS DRAINAGE     x2   PERIPHERAL VASCULAR ATHERECTOMY  03/30/2021   Procedure: PERIPHERAL VASCULAR ATHERECTOMY;  Surgeon: Runell Gess, MD;  Location: [facility] INVASIVE CV LAB;  Service: Cardiovascular;;  left common iliac artery   PERIPHERAL VASCULAR BALLOON ANGIOPLASTY Left 04/12/2022   Procedure: PERIPHERAL VASCULAR BALLOON ANGIOPLASTY;  Surgeon: Runell Gess, MD;  Location: [facility]  INVASIVE CV LAB;  Service: Cardiovascular;  Laterality: Left;  SFA   PERIPHERAL VASCULAR INTERVENTION  03/30/2021   Procedure: PERIPHERAL VASCULAR INTERVENTION;  Surgeon: Runell Gess, MD;  Location: [facility];  Service: Cardiovascular;;   POLYPECTOMY  06/13/2017   Procedure: POLYPECTOMY;  Surgeon: Corbin Ade, MD;  Location: [facility];  Service: Endoscopy;;  colon   RADIOLOGY WITH ANESTHESIA N/A 05/17/2022   Procedure: Diagnostic Cerebral Angiogram, Possible Aneurysm Coiling, Possible Stent;  Surgeon: Lisbeth Renshaw, MD;  Location: [facility] OR;  Service: Radiology;  Laterality: N/A;   SHOULDER SURGERY Right    VASCULAR SURGERY Right 01/02/2013   diamondback orbital rotational  atherectomy, chocolate balloon and IDEV  Stent.   No Known Allergies Prior to Admission medications   Medication Sig Start Date End Date Taking? Authorizing Provider  acetaminophen (TYLENOL) 650 MG CR tablet Take 650 mg by mouth every 8 (eight) hours as needed for pain.    [provider]  aspirin EC 81 MG EC tablet Take 1 tablet (81 mg total) by mouth daily. 09/11/18   Catarina Hartshorn, MD  Blood Glucose Monitoring Suppl (ONETOUCH VERIO FLEX SYSTEM) w/Device KIT  11/21/20   [provider]  carboxymethylcellulose (REFRESH PLUS) 0.5 % SOLN Place 1  drop into both eyes 3 (three) times daily as needed (dry eyes).    [provider]  clopidogrel (PLAVIX) 75 MG tablet Take 1 tablet (75 mg total) by mouth daily with breakfast. 05/03/15   Hilty, Lisette Abu, MD  cyclobenzaprine (FLEXERIL) 10 MG tablet Take 1 tablet (10 mg total) by mouth 3 (three) times daily as needed for muscle spasms. 10/31/17   Tressie Stalker, MD  empagliflozin (JARDIANCE) 25 MG TABS tablet Take 25 mg by mouth daily.    [provider]  gabapentin (NEURONTIN) 300 MG capsule Take 300 mg by mouth 3 (three) times daily. 08/19/18   [provider]  GLIMEPIRIDE PO Take 1 tablet by mouth in the morning and at  bedtime.    [provider]  HUMALOG MIX 75/25 KWIKPEN (75-25) 100 UNIT/ML Kwikpen Inject 50 Units into the skin in the morning and at bedtime. 09/07/18   [provider]  isosorbide mononitrate (IMDUR) 30 MG 24 hr tablet Take 1 tablet (30 mg total) by mouth daily. 04/25/22   Runell Gess, MD  Lancets [facility] DELICA PLUS [place]) MISC Apply topically daily. 02/07/21   [provider]  Lancets (ONETOUCH DELICA PLUS [place]) MISC Apply topically 2 (two) times daily. 11/21/20   [provider]  LANTUS SOLOSTAR 100 UNIT/ML Solostar Pen Inject 50 Units into the skin 2 (two) times daily. 09/08/19   [provider]  losartan (COZAAR) 50 MG tablet Take 50 mg by mouth daily. 04/27/22   [provider]  Loteprednol Etabonate (EYSUVIS) 0.25 % SUSP Place 1 drop into both eyes at bedtime as needed (Dry Eye).    [provider]  metoprolol succinate (TOPROL-XL) 25 MG 24 hr tablet TAKE 1 TABLET BY MOUTH ONCE A DAY. Patient taking differently: Take 25 mg by mouth daily. 09/26/22   Chrystie Nose, MD  Na Sulfate-K Sulfate-Mg Sulf 17.5-3.13-1.6 GM/177ML SOLN As directed 09/19/22   Rourk, Gerrit Friends, MD  [facility] VERIO test strip 2 (two) times daily. 11/21/20   [provider]  Oxycodone HCl 10 MG TABS Take 10 mg by mouth every 6 (six) hours as needed (Pain). 09/10/22   [provider]  oxyCODONE-acetaminophen (PERCOCET) 10-325 MG tablet Take 1 tablet by mouth 4 (four) times daily as needed for pain. 02/20/21   [provider]  PROAIR HFA 108 (90 BASE) MCG/ACT inhaler Inhale 2 puffs into the lungs every 6 (six) hours as needed for wheezing or shortness of breath.  08/14/13   [provider]  rosuvastatin (CRESTOR) 40 MG tablet Take 1 tablet (40 mg total) by mouth daily. 09/10/18   Catarina Hartshorn, MD  zolpidem (AMBIEN CR) 12.5 MG CR tablet Take 12.5 mg by mouth at bedtime. 06/15/20   [provider]   Social History    Socioeconomic History   Marital status: Single    Spouse name: Not on file   Number of children: 1   Years of education: Not on file   Highest education level: Not on file  Occupational History   Occupation: disabled    Employer: DISABLED  Tobacco Use   Smoking status: Every Day    Packs/day: 1.50    Years: 39.00    Additional pack years: 0.00    Total pack years: 58.50    Types: Cigarettes   Smokeless tobacco: Never  Vaping Use   Vaping Use: Never used  Substance and Sexual Activity   Alcohol use: Yes    Alcohol/week: 12.0 standard drinks of  alcohol    Types: 12 Cans of beer per week    Comment: weekly   Drug use: No   Sexual activity: Not Currently  Other Topics Concern   Not on file  Social History Narrative   Lives w/ girlfriend   Social Determinants of Health   Financial Resource Strain: Not on file  Food Insecurity: Not on file  Transportation Needs: Not on file  Physical Activity: Not on file  Stress: Not on file  Social Connections: Not on file   Family History  Problem Relation Age of Onset   Anesthesia problems Neg Hx    Colon cancer Neg Hx    Gastric cancer Neg Hx    Esophageal cancer Neg Hx    Colon polyps Neg Hx     ROS: Currently denies lightheadedness, dizziness, Fever, chills, CP, SOB.   No personal history of DVT, PE, MI, or CVA. No loose teeth or dentures All other systems have been reviewed and were otherwise currently negative with the exception of those mentioned in the HPI and as above.  Objective: Vitals: Height: 5?11?Marland Kitchen  Weight: 191.  Blood pressure: 131/74  O2 SAT: 92% on room air.  Temperature: 98.6.  Pulse: 79.   Physical Exam: General: Alert, NAD. Trendelenberg Gait  HEENT: EOMI, Good Neck Extension  Pulm: No increased work of breathing. Lungs with bilateral wheezes. CV: RRR, No m/g/r appreciated  GI: soft, NT, ND Neuro: Neuro without gross focal deficit.  Sensation intact distally Skin: No lesions in the area of chief  complaint MSK/Surgical Site: 0-80 degrees of forward flexion at the right hip.  Locates pain to the lateral right hip.  Dorsiflexion and plantarflexion intact at the ankle.  Sensation is limited at the foot due to peripheral neuropathy. Not able to palpate PT pulse but was able to find a PT pulse on Doppler ultrasound .    Imaging Review Plain radiographs demonstrate severe degenerative joint disease of the right hip.   The bone quality appears to be adequate for age and reported activity level.  Preoperative templating of the joint replacement has been completed, documented, and submitted to the Operating Room personnel in order to optimize intra-operative equipment management.  Assessment: djd right hip Active Problems:   * No active hospital problems. *   Plan: Plan for Procedure(s): TOTAL HIP ARTHROPLASTY  The patient history, physical exam, clinical judgement of the provider and imaging are consistent with end stage degenerative joint disease and total joint arthroplasty is deemed medically necessary. The treatment options including medical management, injection therapy, and arthroplasty were discussed at length. The risks and benefits of Procedure(s): TOTAL HIP ARTHROPLASTY were presented and reviewed.  The risks of nonoperative treatment, versus surgical intervention including but not limited to continued pain, aseptic loosening, stiffness, dislocation/subluxation, infection, bleeding, nerve injury, blood clots, cardiopulmonary complications, morbidity, mortality, among others were discussed. The patient verbalizes understanding and wishes to proceed with the plan.  Patient is being admitted for surgery, pain control, PT, prophylactic antibiotics, VTE prophylaxis, progressive ambulation, ADL's and discharge planning.   Dental prophylaxis discussed and recommended for 2 years postoperatively.  The patient does not meet the criteria for TXA which will be used perioperatively.    Baseline ASA and plavix  will be used postoperatively for DVT prophylaxis in addition to SCDs, and early ambulation. The patient is planning to be discharged home with OPPT in care of wife    Armida Sans, Cordelia Poche 12/24/2022 1:01 PM

## 2022-12-25 ENCOUNTER — Ambulatory Visit (HOSPITAL_COMMUNITY): Payer: 59 | Admitting: Physician Assistant

## 2022-12-25 ENCOUNTER — Other Ambulatory Visit: Payer: Self-pay

## 2022-12-25 ENCOUNTER — Encounter (HOSPITAL_COMMUNITY)
Admission: RE | Disposition: A | Discharge status: Home / Self Care | Payer: Self-pay | Source: Home / Self Care | Attending: Orthopedic Surgery

## 2022-12-25 ENCOUNTER — Ambulatory Visit (HOSPITAL_BASED_OUTPATIENT_CLINIC_OR_DEPARTMENT_OTHER): Payer: 59 | Admitting: Anesthesiology

## 2022-12-25 ENCOUNTER — Ambulatory Visit (HOSPITAL_COMMUNITY): Payer: 59

## 2022-12-25 ENCOUNTER — Encounter (HOSPITAL_COMMUNITY): Payer: Self-pay | Admitting: Orthopedic Surgery

## 2022-12-25 ENCOUNTER — Ambulatory Visit (HOSPITAL_COMMUNITY)
Admission: RE | Admit: 2022-12-25 | Discharge: 2022-12-25 | Disposition: A | Discharge status: Home / Self Care | Payer: 59 | Attending: Orthopedic Surgery | Admitting: Orthopedic Surgery

## 2022-12-25 DIAGNOSIS — Z951 Presence of aortocoronary bypass graft: Secondary | ICD-10-CM | POA: Diagnosis not present

## 2022-12-25 DIAGNOSIS — M1611 Unilateral primary osteoarthritis, right hip: Secondary | ICD-10-CM | POA: Insufficient documentation

## 2022-12-25 DIAGNOSIS — F17210 Nicotine dependence, cigarettes, uncomplicated: Secondary | ICD-10-CM

## 2022-12-25 DIAGNOSIS — R339 Retention of urine, unspecified: Secondary | ICD-10-CM | POA: Insufficient documentation

## 2022-12-25 DIAGNOSIS — Z96641 Presence of right artificial hip joint: Secondary | ICD-10-CM | POA: Diagnosis not present

## 2022-12-25 DIAGNOSIS — Z794 Long term (current) use of insulin: Secondary | ICD-10-CM

## 2022-12-25 DIAGNOSIS — I2510 Atherosclerotic heart disease of native coronary artery without angina pectoris: Secondary | ICD-10-CM | POA: Diagnosis not present

## 2022-12-25 DIAGNOSIS — E1151 Type 2 diabetes mellitus with diabetic peripheral angiopathy without gangrene: Secondary | ICD-10-CM | POA: Diagnosis not present

## 2022-12-25 DIAGNOSIS — I10 Essential (primary) hypertension: Secondary | ICD-10-CM

## 2022-12-25 DIAGNOSIS — F17200 Nicotine dependence, unspecified, uncomplicated: Secondary | ICD-10-CM | POA: Diagnosis not present

## 2022-12-25 DIAGNOSIS — Z471 Aftercare following joint replacement surgery: Secondary | ICD-10-CM | POA: Diagnosis not present

## 2022-12-25 HISTORY — PX: TOTAL HIP ARTHROPLASTY: SHX124

## 2022-12-25 LAB — BASIC METABOLIC PANEL
Anion gap: 9 (ref 5–15)
BUN: 8 mg/dL (ref 8–23)
CO2: 27 mmol/L (ref 22–32)
Calcium: 8.8 mg/dL — ABNORMAL LOW (ref 8.9–10.3)
Chloride: 101 mmol/L (ref 98–111)
Creatinine, Ser: 0.87 mg/dL (ref 0.61–1.24)
GFR, Estimated: >60 mL/min (ref >60)
Glucose, Bld: 230 mg/dL — ABNORMAL HIGH (ref 70–99)
Potassium: 5 mmol/L (ref 3.5–5.1)
Sodium: 137 mmol/L (ref 135–145)

## 2022-12-25 LAB — GLUCOSE, CAPILLARY
Glucose-Capillary: 192 mg/dL — ABNORMAL HIGH (ref 70–99)
Glucose-Capillary: 200 mg/dL — ABNORMAL HIGH (ref 70–99)

## 2022-12-25 SURGERY — ARTHROPLASTY, HIP, TOTAL,POSTERIOR APPROACH
Anesthesia: General | Site: Hip | Laterality: Right

## 2022-12-25 MED ORDER — ONDANSETRON HCL 4 MG/2ML IJ SOLN: 4.0000 mg | Freq: Four times a day (QID) | INTRAMUSCULAR | Status: DC | PRN

## 2022-12-25 MED ORDER — ORAL CARE MOUTH RINSE: 15.0000 mL | Freq: Once | OROMUCOSAL | Status: AC

## 2022-12-25 MED ORDER — OXYCODONE HCL 5 MG PO TABS
ORAL_TABLET | ORAL | Status: AC
  Administered 2022-12-25: 10 mg via ORAL
  Filled 2022-12-25: qty 2

## 2022-12-25 MED ORDER — LACTATED RINGERS IV BOLUS: 250.0000 mL | Freq: Once | INTRAVENOUS | Status: DC

## 2022-12-25 MED ORDER — FENTANYL CITRATE PF 50 MCG/ML IJ SOSY
25.0000 ug | PREFILLED_SYRINGE | INTRAMUSCULAR | Status: DC | PRN
  Administered 2022-12-25 (×4): 25 ug via INTRAVENOUS

## 2022-12-25 MED ORDER — ASPIRIN 81 MG PO TBEC: 81.0000 mg | DELAYED_RELEASE_TABLET | Freq: Every day | ORAL | Status: DC

## 2022-12-25 MED ORDER — MIDAZOLAM HCL 5 MG/5ML IJ SOLN
INTRAMUSCULAR | Status: DC | PRN
  Administered 2022-12-25: 2 mg via INTRAVENOUS

## 2022-12-25 MED ORDER — FENTANYL CITRATE (PF) 250 MCG/5ML IJ SOLN
INTRAMUSCULAR | Status: AC
  Filled 2022-12-25: qty 5

## 2022-12-25 MED ORDER — ZOLPIDEM TARTRATE 5 MG PO TABS: 5.0000 mg | ORAL_TABLET | Freq: Every evening | ORAL | Status: DC | PRN

## 2022-12-25 MED ORDER — GABAPENTIN 300 MG PO CAPS: 300.0000 mg | ORAL_CAPSULE | Freq: Three times a day (TID) | ORAL | Status: DC

## 2022-12-25 MED ORDER — POVIDONE-IODINE 7.5 % EX SOLN: Freq: Once | CUTANEOUS | Status: DC

## 2022-12-25 MED ORDER — HYDROMORPHONE HCL 1 MG/ML IJ SOLN
0.5000 mg | INTRAMUSCULAR | Status: DC | PRN
Start: 2022-12-25 — End: 2022-12-25

## 2022-12-25 MED ORDER — ACETAMINOPHEN 500 MG PO TABS
1000.0000 mg | ORAL_TABLET | Freq: Once | ORAL | Status: AC
  Administered 2022-12-25: 1000 mg via ORAL
  Filled 2022-12-25: qty 2

## 2022-12-25 MED ORDER — OXYCODONE HCL 5 MG/5ML PO SOLN: 5.0000 mg | Freq: Once | ORAL | Status: DC | PRN

## 2022-12-25 MED ORDER — CLOPIDOGREL BISULFATE 75 MG PO TABS: 75.0000 mg | ORAL_TABLET | Freq: Every day | ORAL | Status: DC

## 2022-12-25 MED ORDER — LACTATED RINGERS IV BOLUS
250.0000 mL | Freq: Once | INTRAVENOUS | Status: DC
Start: 2022-12-25 — End: 2022-12-25

## 2022-12-25 MED ORDER — ACETAMINOPHEN 500 MG PO TABS
1000.0000 mg | ORAL_TABLET | Freq: Four times a day (QID) | ORAL | Status: DC
Start: 2022-12-25 — End: 2022-12-25

## 2022-12-25 MED ORDER — LOTEPREDNOL ETABONATE 0.25 % OP SUSP: 1.0000 [drp] | Freq: Every evening | OPHTHALMIC | Status: DC | PRN

## 2022-12-25 MED ORDER — FENTANYL CITRATE (PF) 100 MCG/2ML IJ SOLN
INTRAMUSCULAR | Status: DC | PRN
  Administered 2022-12-25 (×5): 50 ug via INTRAVENOUS

## 2022-12-25 MED ORDER — POLYETHYLENE GLYCOL 3350 17 G PO PACK
17.0000 g | PACK | Freq: Every day | ORAL | Status: DC | PRN
Start: 2022-12-25 — End: 2022-12-25

## 2022-12-25 MED ORDER — LIDOCAINE HCL (PF) 2 % IJ SOLN
INTRAMUSCULAR | Status: AC
  Filled 2022-12-25: qty 5

## 2022-12-25 MED ORDER — HYDROMORPHONE HCL 2 MG PO TABS: 1.0000 mg | ORAL_TABLET | ORAL | Status: DC | PRN

## 2022-12-25 MED ORDER — METOCLOPRAMIDE HCL 5 MG/ML IJ SOLN: 5.0000 mg | Freq: Three times a day (TID) | INTRAMUSCULAR | Status: DC | PRN

## 2022-12-25 MED ORDER — 0.9 % SODIUM CHLORIDE (POUR BTL) OPTIME
TOPICAL | Status: DC | PRN
  Administered 2022-12-25: 1000 mL

## 2022-12-25 MED ORDER — CHLORHEXIDINE GLUCONATE 0.12 % MT SOLN
15.0000 mL | Freq: Once | OROMUCOSAL | Status: AC
  Administered 2022-12-25: 15 mL via OROMUCOSAL

## 2022-12-25 MED ORDER — KETOROLAC TROMETHAMINE 30 MG/ML IJ SOLN
INTRAMUSCULAR | Status: DC | PRN
  Administered 2022-12-25: 30 mg via INTRAVENOUS

## 2022-12-25 MED ORDER — GLIMEPIRIDE 1 MG PO TABS: 1.0000 mg | ORAL_TABLET | Freq: Two times a day (BID) | ORAL | Status: DC

## 2022-12-25 MED ORDER — ONDANSETRON HCL 4 MG PO TABS: 4.0000 mg | ORAL_TABLET | Freq: Three times a day (TID) | ORAL | 0 refills | Status: AC | PRN

## 2022-12-25 MED ORDER — PROPOFOL 10 MG/ML IV BOLUS
INTRAVENOUS | Status: DC | PRN
  Administered 2022-12-25: 130 mg via INTRAVENOUS

## 2022-12-25 MED ORDER — MAGNESIUM CITRATE PO SOLN: 1.0000 | Freq: Once | ORAL | Status: DC | PRN

## 2022-12-25 MED ORDER — STERILE WATER FOR IRRIGATION IR SOLN
Status: DC | PRN
  Administered 2022-12-25: 2000 mL

## 2022-12-25 MED ORDER — OXYCODONE HCL 5 MG PO TABS: 5.0000 mg | ORAL_TABLET | Freq: Once | ORAL | Status: DC | PRN

## 2022-12-25 MED ORDER — LOSARTAN POTASSIUM 50 MG PO TABS: 50.0000 mg | ORAL_TABLET | Freq: Every day | ORAL | Status: DC

## 2022-12-25 MED ORDER — ROCURONIUM BROMIDE 10 MG/ML (PF) SYRINGE
PREFILLED_SYRINGE | INTRAVENOUS | Status: DC | PRN
  Administered 2022-12-25: 50 mg via INTRAVENOUS

## 2022-12-25 MED ORDER — CYCLOBENZAPRINE HCL 10 MG PO TABS: 10.0000 mg | ORAL_TABLET | Freq: Three times a day (TID) | ORAL | Status: DC | PRN

## 2022-12-25 MED ORDER — FENTANYL CITRATE PF 50 MCG/ML IJ SOSY
PREFILLED_SYRINGE | INTRAMUSCULAR | Status: AC
  Filled 2022-12-25: qty 1

## 2022-12-25 MED ORDER — METOCLOPRAMIDE HCL 5 MG PO TABS
5.0000 mg | ORAL_TABLET | Freq: Three times a day (TID) | ORAL | Status: DC | PRN
Start: 2022-12-25 — End: 2022-12-25

## 2022-12-25 MED ORDER — DEXAMETHASONE SODIUM PHOSPHATE 10 MG/ML IJ SOLN
10.0000 mg | Freq: Once | INTRAMUSCULAR | Status: DC
Start: 2022-12-26 — End: 2022-12-25

## 2022-12-25 MED ORDER — ALBUTEROL SULFATE HFA 108 (90 BASE) MCG/ACT IN AERS: 2.0000 | INHALATION_SPRAY | Freq: Four times a day (QID) | RESPIRATORY_TRACT | Status: DC | PRN

## 2022-12-25 MED ORDER — CARBOXYMETHYLCELLULOSE SODIUM 0.5 % OP SOLN: 1.0000 [drp] | Freq: Three times a day (TID) | OPHTHALMIC | Status: DC | PRN

## 2022-12-25 MED ORDER — PHENYLEPHRINE 80 MCG/ML (10ML) SYRINGE FOR IV PUSH (FOR BLOOD PRESSURE SUPPORT)
PREFILLED_SYRINGE | INTRAVENOUS | Status: AC
  Filled 2022-12-25: qty 10

## 2022-12-25 MED ORDER — CEFAZOLIN SODIUM-DEXTROSE 2-4 GM/100ML-% IV SOLN
INTRAVENOUS | Status: AC
  Administered 2022-12-25: 2 g via INTRAVENOUS
  Filled 2022-12-25: qty 100

## 2022-12-25 MED ORDER — HYDROMORPHONE HCL 2 MG PO TABS: 2.0000 mg | ORAL_TABLET | ORAL | 0 refills | Status: DC | PRN

## 2022-12-25 MED ORDER — PHENYLEPHRINE 80 MCG/ML (10ML) SYRINGE FOR IV PUSH (FOR BLOOD PRESSURE SUPPORT)
PREFILLED_SYRINGE | INTRAVENOUS | Status: DC | PRN
  Administered 2022-12-25: 80 ug via INTRAVENOUS
  Administered 2022-12-25: 160 ug via INTRAVENOUS
  Administered 2022-12-25: 80 ug via INTRAVENOUS

## 2022-12-25 MED ORDER — BUPIVACAINE HCL (PF) 0.25 % IJ SOLN
INTRAMUSCULAR | Status: DC | PRN
  Administered 2022-12-25: 30 mL

## 2022-12-25 MED ORDER — ISOSORBIDE MONONITRATE ER 30 MG PO TB24: 30.0000 mg | ORAL_TABLET | Freq: Every day | ORAL | Status: DC

## 2022-12-25 MED ORDER — DIPHENHYDRAMINE HCL 12.5 MG/5ML PO ELIX: 12.5000 mg | ORAL_SOLUTION | ORAL | Status: DC | PRN

## 2022-12-25 MED ORDER — SUGAMMADEX SODIUM 200 MG/2ML IV SOLN
INTRAVENOUS | Status: DC | PRN
  Administered 2022-12-25: 200 mg via INTRAVENOUS

## 2022-12-25 MED ORDER — ACETAMINOPHEN 325 MG PO TABS: 325.0000 mg | ORAL_TABLET | Freq: Four times a day (QID) | ORAL | Status: DC | PRN

## 2022-12-25 MED ORDER — INSULIN LISPRO PROT & LISPRO (75-25 MIX) 100 UNIT/ML KWIKPEN: 50.0000 [IU] | PEN_INJECTOR | SUBCUTANEOUS | Status: DC

## 2022-12-25 MED ORDER — MIDAZOLAM HCL 2 MG/2ML IJ SOLN
INTRAMUSCULAR | Status: AC
  Filled 2022-12-25: qty 2

## 2022-12-25 MED ORDER — METOPROLOL TARTRATE 5 MG/5ML IV SOLN
INTRAVENOUS | Status: DC | PRN
  Administered 2022-12-25 (×2): 1 mg via INTRAVENOUS

## 2022-12-25 MED ORDER — CEFAZOLIN SODIUM-DEXTROSE 2-4 GM/100ML-% IV SOLN
2.0000 g | INTRAVENOUS | Status: AC
  Administered 2022-12-25: 2 g via INTRAVENOUS
  Filled 2022-12-25: qty 100

## 2022-12-25 MED ORDER — CEFAZOLIN SODIUM-DEXTROSE 2-4 GM/100ML-% IV SOLN: 2.0000 g | Freq: Four times a day (QID) | INTRAVENOUS | Status: DC

## 2022-12-25 MED ORDER — LACTATED RINGERS IV BOLUS
500.0000 mL | Freq: Once | INTRAVENOUS | Status: AC
Start: 2022-12-25 — End: 2022-12-25
  Administered 2022-12-25: 500 mL via INTRAVENOUS

## 2022-12-25 MED ORDER — BISACODYL 10 MG RE SUPP
10.0000 mg | Freq: Every day | RECTAL | Status: DC | PRN
Start: 2022-12-25 — End: 2022-12-25

## 2022-12-25 MED ORDER — BUPIVACAINE HCL (PF) 0.25 % IJ SOLN
INTRAMUSCULAR | Status: AC
  Filled 2022-12-25: qty 30

## 2022-12-25 MED ORDER — ROSUVASTATIN CALCIUM 20 MG PO TABS: 40.0000 mg | ORAL_TABLET | Freq: Every day | ORAL | Status: DC

## 2022-12-25 MED ORDER — DEXAMETHASONE SODIUM PHOSPHATE 10 MG/ML IJ SOLN
INTRAMUSCULAR | Status: AC
  Filled 2022-12-25: qty 1

## 2022-12-25 MED ORDER — POVIDONE-IODINE 10 % EX SWAB: 2.0000 | Freq: Once | CUTANEOUS | Status: DC

## 2022-12-25 MED ORDER — ONDANSETRON HCL 4 MG PO TABS
4.0000 mg | ORAL_TABLET | Freq: Four times a day (QID) | ORAL | Status: DC | PRN
Start: 2022-12-25 — End: 2022-12-25

## 2022-12-25 MED ORDER — ROCURONIUM BROMIDE 10 MG/ML (PF) SYRINGE
PREFILLED_SYRINGE | INTRAVENOUS | Status: AC
  Filled 2022-12-25: qty 10

## 2022-12-25 MED ORDER — PHENOL 1.4 % MT LIQD
1.0000 | OROMUCOSAL | Status: DC | PRN
Start: 2022-12-25 — End: 2022-12-25

## 2022-12-25 MED ORDER — DEXAMETHASONE SODIUM PHOSPHATE 4 MG/ML IJ SOLN
INTRAMUSCULAR | Status: DC | PRN
  Administered 2022-12-25: 4 mg via INTRAVENOUS

## 2022-12-25 MED ORDER — OXYCODONE HCL 10 MG PO TABS: 10.0000 mg | ORAL_TABLET | ORAL | Status: DC

## 2022-12-25 MED ORDER — HYDROMORPHONE HCL 2 MG PO TABS
ORAL_TABLET | ORAL | Status: AC
  Administered 2022-12-25: 2 mg via ORAL
  Filled 2022-12-25: qty 1

## 2022-12-25 MED ORDER — KETOROLAC TROMETHAMINE 30 MG/ML IJ SOLN
INTRAMUSCULAR | Status: AC
  Filled 2022-12-25: qty 1

## 2022-12-25 MED ORDER — ONDANSETRON HCL 4 MG/2ML IJ SOLN
INTRAMUSCULAR | Status: DC | PRN
  Administered 2022-12-25: 4 mg via INTRAVENOUS

## 2022-12-25 MED ORDER — MENTHOL 3 MG MT LOZG: 1.0000 | LOZENGE | OROMUCOSAL | Status: DC | PRN

## 2022-12-25 MED ORDER — LIDOCAINE HCL (CARDIAC) PF 100 MG/5ML IV SOSY
PREFILLED_SYRINGE | INTRAVENOUS | Status: DC | PRN
  Administered 2022-12-25: 60 mg via INTRAVENOUS

## 2022-12-25 MED ORDER — ALUM & MAG HYDROXIDE-SIMETH 200-200-20 MG/5ML PO SUSP
30.0000 mL | ORAL | Status: DC | PRN
Start: 2022-12-25 — End: 2022-12-25

## 2022-12-25 MED ORDER — ONDANSETRON HCL 4 MG/2ML IJ SOLN
INTRAMUSCULAR | Status: AC
  Filled 2022-12-25: qty 2

## 2022-12-25 MED ORDER — GABAPENTIN 300 MG PO CAPS
ORAL_CAPSULE | ORAL | Status: AC
  Administered 2022-12-25: 300 mg via ORAL
  Filled 2022-12-25: qty 1

## 2022-12-25 MED ORDER — LACTATED RINGERS IV SOLN: INTRAVENOUS | Status: DC

## 2022-12-25 MED ORDER — DOCUSATE SODIUM 100 MG PO CAPS
100.0000 mg | ORAL_CAPSULE | Freq: Two times a day (BID) | ORAL | Status: DC
Start: 2022-12-25 — End: 2022-12-25

## 2022-12-25 MED ORDER — METOPROLOL SUCCINATE ER 25 MG PO TB24: 25.0000 mg | ORAL_TABLET | Freq: Every day | ORAL | Status: DC

## 2022-12-25 SURGICAL SUPPLY — 60 items
ACETAB CUP W/GRIPTION 54 (Plate) ×1 IMPLANT
BAG COUNTER SPONGE SURGICOUNT (BAG) IMPLANT
BAG SPNG CNTER NS LX DISP (BAG)
BIT DRILL 2.0X128 (BIT) ×1 IMPLANT
BLADE SAW SGTL 73X25 THK (BLADE) ×1 IMPLANT
CLSR STERI-STRIP ANTIMIC 1/2X4 (GAUZE/BANDAGES/DRESSINGS) ×2 IMPLANT
COVER SURGICAL LIGHT HANDLE (MISCELLANEOUS) ×1 IMPLANT
CUP ACETAB W/GRIPTION 54 (Plate) IMPLANT
DRAPE INCISE IOBAN 66X45 STRL (DRAPES) ×1 IMPLANT
DRAPE ORTHO SPLIT 77X108 STRL (DRAPES) ×2
DRAPE POUCH INSTRU U-SHP 10X18 (DRAPES) ×1 IMPLANT
DRAPE SHEET LG 3/4 BI-LAMINATE (DRAPES) ×1 IMPLANT
DRAPE SURG 17X11 SM STRL (DRAPES) ×1 IMPLANT
DRAPE SURG ORHT 6 SPLT 77X108 (DRAPES) ×2 IMPLANT
DRAPE U-SHAPE 47X51 STRL (DRAPES) ×1 IMPLANT
DRSG MEPILEX POST OP 4X12 (GAUZE/BANDAGES/DRESSINGS) IMPLANT
DRSG MEPILEX POST OP 4X8 (GAUZE/BANDAGES/DRESSINGS) ×1 IMPLANT
DURAPREP 26ML APPLICATOR (WOUND CARE) ×2 IMPLANT
ELECT BLADE TIP CTD 4 INCH (ELECTRODE) ×1 IMPLANT
ELECT REM PT RETURN 15FT ADLT (MISCELLANEOUS) ×1 IMPLANT
ELIMINATOR HOLE APEX DEPUY (Hips) IMPLANT
FACESHIELD WRAPAROUND (MASK) ×2 IMPLANT
FACESHIELD WRAPAROUND OR TEAM (MASK) ×2 IMPLANT
GLOVE BIO SURGEON STRL SZ 6.5 (GLOVE) ×1 IMPLANT
GLOVE BIO SURGEON STRL SZ7.5 (GLOVE) ×1 IMPLANT
GLOVE BIOGEL PI IND STRL 7.0 (GLOVE) ×1 IMPLANT
GLOVE BIOGEL PI IND STRL 8 (GLOVE) ×1 IMPLANT
GOWN STRL SURGICAL XL XLNG (GOWN DISPOSABLE) ×2 IMPLANT
HEAD CERAMIC 36 PLUS5 (Hips) IMPLANT
HOOD PEEL AWAY T7 (MISCELLANEOUS) ×3 IMPLANT
K-WIRE TROCAR PT 2.0 150MM (WIRE) ×1
KIT BASIN OR (CUSTOM PROCEDURE TRAY) ×1 IMPLANT
KIT TURNOVER KIT A (KITS) IMPLANT
KWIRE TROCAR PT 2.0 150 (WIRE) ×1 IMPLANT
KWIRE TROCAR PT 2.0 150MM (WIRE) ×1 IMPLANT
LINER NEUTRAL 54X36MM PLUS 4 (Hips) IMPLANT
MANIFOLD NEPTUNE II (INSTRUMENTS) ×1 IMPLANT
NDL MA TROC 1/2 (NEEDLE) IMPLANT
NDL SAFETY ECLIP 18X1.5 (MISCELLANEOUS) ×2 IMPLANT
NEEDLE ANCHOR KEITH 2 7/8 STR (NEEDLE) ×1 IMPLANT
NEEDLE MA TROC 1/2 (NEEDLE) IMPLANT
NS IRRIG 1000ML POUR BTL (IV SOLUTION) ×1 IMPLANT
PACK TOTAL JOINT (CUSTOM PROCEDURE TRAY) ×1 IMPLANT
PROTECTOR NERVE ULNAR (MISCELLANEOUS) ×1 IMPLANT
STEM OFFSET DUOFIX SZ6 STD (Stem) IMPLANT
SUCTION FRAZIER HANDLE 12FR (TUBING) ×1
SUCTION TUBE FRAZIER 12FR DISP (TUBING) ×1 IMPLANT
SUT ETHIBOND NAB CT1 #1 30IN (SUTURE) ×3 IMPLANT
SUT VIC AB 0 CT1 36 (SUTURE) ×1 IMPLANT
SUT VIC AB 1 CT1 36 (SUTURE) ×2 IMPLANT
SUT VIC AB 2-0 CT1 27 (SUTURE) ×3
SUT VIC AB 2-0 CT1 TAPERPNT 27 (SUTURE) ×3 IMPLANT
SUT VIC AB 3-0 SH 27 (SUTURE) ×2
SUT VIC AB 3-0 SH 27X BRD (SUTURE) ×2 IMPLANT
SUT VIC AB 3-0 SH 8-18 (SUTURE) ×1 IMPLANT
SYR CONTROL 10ML LL (SYRINGE) ×2 IMPLANT
TOWEL OR 17X26 10 PK STRL BLUE (TOWEL DISPOSABLE) ×1 IMPLANT
TRAY FOLEY MTR SLVR 16FR STAT (SET/KITS/TRAYS/PACK) ×1 IMPLANT
TUBE SUCTION HIGH CAP CLEAR NV (SUCTIONS) ×1 IMPLANT
WATER STERILE IRR 1000ML POUR (IV SOLUTION) ×2 IMPLANT

## 2022-12-25 NOTE — Evaluation (Signed)
Physical Therapy Evaluation Patient Details Name: [name] MRN: [id number] DOB: 1960/11/05 Today's Date: 12/25/2022  History of Present Illness  62 yo male presents to therapy s/p R THA, posterior lateral approach with no formal hip precautions due to failure of conservative measures. Pt PMH includes but is not limited to: LBP, CAD s/p CABG. COPD, HDL, HTN, PAD, tobacco abuse, DM II, ACDF and back sx, and R shoulder sx.  Clinical Impression    [name] is a 62 y.o. male POD 0 s/p R THA. Patient reports IND with mobility at baseline. Patient is now limited by functional impairments (see PT problem list below) and requires min guard and cues for transfers and gait with RW. Patient was able to ambulate 60 and 50 feet with RW and min guard and cues for safe walker management. Patient educated on safe sequencing for stair mobility, fall risk prevention, use of CP/ICE, pain management pt and significant other verbalized understanding of safe guarding position for people assisting with mobility. Patient instructed in exercises to facilitate ROM and circulation reviewed with HO provided. Pt exhibited unproductive cough t/o eval with O2 saturation fluctuating from 87-90% on RA, nursing staff aware and communicated with MD. Pt reports no dizziness or SOB.  Pt is motivated to return home and elected to same day d/c. Patient will benefit from continued skilled PT interventions to address impairments and progress towards PLOF. Patient has met mobility goals at adequate level for discharge home with family and social support with OPPT; will continue to follow if pt continues acute stay to progress towards Mod I goals.      Recommendations for follow up therapy are one component of a multi-disciplinary discharge planning process, led by the attending physician.  Recommendations may be updated based on patient status, additional functional criteria and insurance authorization.  Follow Up Recommendations        Assistance Recommended at Discharge Intermittent Supervision/Assistance  Patient can return home with the following  A little help with walking and/or transfers;A little help with bathing/dressing/bathroom;Assistance with cooking/housework;Assist for transportation;Help with stairs or ramp for entrance    Equipment Recommendations Rolling walker (2 wheels) (provided and adjusted at eval)  Recommendations for Other Services       Functional Status Assessment Patient has had a recent decline in their functional status and demonstrates the ability to make significant improvements in function in a reasonable and predictable amount of time.     Precautions / Restrictions Precautions Precautions: Fall Restrictions Weight Bearing Restrictions: No Other Position/Activity Restrictions: O2 saturation-monitor      Mobility  Bed Mobility Overal bed mobility: Needs Assistance Bed Mobility: Supine to Sit     Supine to sit: Min guard     General bed mobility comments: cues, increased time, HOB elevated and use of bed rail    Transfers Overall transfer level: Needs assistance Equipment used: Rolling walker (2 wheels) Transfers: Sit to/from Stand Sit to Stand: Min guard           General transfer comment: cues for proper UE and AD placement    Ambulation/Gait Ambulation/Gait assistance: Min guard Gait Distance (Feet): 60 Feet Assistive device: Rolling walker (2 wheels) Gait Pattern/deviations: Step-to pattern, Antalgic Gait velocity: decreased        Stairs Stairs: Yes Stairs assistance: Min guard Stair Management: Two rails Number of Stairs: 3 General stair comments: cues for proper sequencing with pt having difficulty recalling to ascend with LLE and descend with R LE. pt ed provided  on use of RW to navigate one step to enter home  Wheelchair Mobility    Modified Rankin (Stroke Patients Only)       Balance Overall balance assessment: Needs  assistance Sitting-balance support: Feet supported Sitting balance-Leahy Scale: Fair     Standing balance support: Bilateral upper extremity supported, Reliant on assistive device for balance, During functional activity Standing balance-Leahy Scale: Poor                               Pertinent Vitals/Pain Pain Assessment Pain Assessment: 0-10 Pain Score: 7  Pain Location: R hip Pain Descriptors / Indicators: Aching, Constant, Discomfort, Operative site guarding Pain Intervention(s): Limited activity within patient's tolerance, Monitored during session, Premedicated before session, Repositioned, Ice applied    Home Living Family/patient expects to be discharged to:: Private residence Living Arrangements: Spouse/significant other Available Help at Discharge: Family Type of Home: House Home Access: Stairs to enter Entrance Stairs-Rails: None Secretary/administrator of Steps: 1   Home Layout: One level Home Equipment: Cane - single point      Prior Function Prior Level of Function : Independent/Modified Independent             Mobility Comments: IND with all ADLs, self care tasks and IADLs, driving       Hand Dominance        Extremity/Trunk Assessment        Lower Extremity Assessment Lower Extremity Assessment: RLE deficits/detail RLE Deficits / Details: ankle DF/PF 5/5 RLE Sensation: history of peripheral neuropathy    Cervical / Trunk Assessment Cervical / Trunk Assessment: Other exceptions (head forward)  Communication   Communication: No difficulties  Cognition Arousal/Alertness: Awake/alert Behavior During Therapy: WFL for tasks assessed/performed Overall Cognitive Status: Within Functional Limits for tasks assessed                                          General Comments      Exercises Total Joint Exercises Ankle Circles/Pumps: AROM, Both, 20 reps Quad Sets: AROM, Right, 5 reps Short Arc Quad: AROM, Right, 5  reps Heel Slides: AROM, Right, 5 reps Hip ABduction/ADduction: AROM, Right, 5 reps, Supine, Standing Knee Flexion: AROM, Right, 5 reps, Standing Marching in Standing: AROM, Right, 5 reps Standing Hip Extension: AROM, Right, 5 reps   Assessment/Plan    PT Assessment Patient needs continued PT services  PT Problem List Decreased strength;Decreased range of motion;Decreased activity tolerance;Decreased balance;Decreased mobility;Decreased coordination;Pain       PT Treatment Interventions DME instruction;Gait training;Stair training;Functional mobility training;Therapeutic activities;Therapeutic exercise;Balance training;Neuromuscular re-education;Patient/family education;Modalities    PT Goals (Current goals can be found in the Care Plan section)  Acute Rehab PT Goals Patient Stated Goal: get back to walking without pain PT Goal Formulation: With patient Time For Goal Achievement: 01/08/23 Potential to Achieve Goals: Good    Frequency 7X/week     Co-evaluation               AM-PAC PT "6 Clicks" Mobility  Outcome Measure Help needed turning from your back to your side while in a flat bed without using bedrails?: A Little Help needed moving from lying on your back to sitting on the side of a flat bed without using bedrails?: A Little Help needed moving to and from a bed to a chair (including a wheelchair)?: A  Little Help needed standing up from a chair using your arms (e.g., wheelchair or bedside chair)?: A Little Help needed to walk in hospital room?: A Little Help needed climbing 3-5 steps with a railing? : A Little 6 Click Score: 18    End of Session Equipment Utilized During Treatment: Gait belt Activity Tolerance: Patient tolerated treatment well Patient left: in chair;with family/visitor present;with call bell/phone within reach Nurse Communication: Mobility status;Other (comment) (pt readiness for d/c from PT standpoint) PT Visit Diagnosis: Unsteadiness on feet  (R26.81);Other abnormalities of gait and mobility (R26.89);Muscle weakness (generalized) (M62.81);Pain Pain - Right/Left: Right Pain - part of body: Hip    Time: 4540-9811 PT Time Calculation (min) (ACUTE ONLY): 54 min   Charges:   PT Evaluation $PT Eval Low Complexity: 1 Low PT Treatments $Gait Training: 8-22 mins $Therapeutic Exercise: 8-22 mins $Therapeutic Activity: 8-22 mins        Johnny Bridge, PT Acute Rehab   Jacqualyn Posey 12/25/2022, 5:16 PM

## 2022-12-25 NOTE — Transfer of Care (Signed)
Immediate Anesthesia Transfer of Care Note  Patient: [name]  Procedure(s) Performed: TOTAL HIP ARTHROPLASTY (Right: Hip)  Patient Location: PACU  Anesthesia Type:General  Level of Consciousness: awake  Airway & Oxygen Therapy: Patient Spontanous Breathing and Patient connected to face mask oxygen  Post-op Assessment: Report given to RN and Post -op Vital signs reviewed and stable  Post vital signs: Reviewed and stable  Last Vitals:  Vitals Value Taken Time  BP 178/84 12/25/22 1156  Temp    Pulse 90 12/25/22 1158  Resp 18 12/25/22 1158  SpO2 90 % 12/25/22 1158  Vitals shown include unvalidated device data.  Last Pain:  Vitals:   12/25/22 0902  TempSrc:   PainSc: 0-No pain         Complications: No notable events documented.

## 2022-12-25 NOTE — Discharge Instructions (Signed)
INSTRUCTIONS AFTER JOINT REPLACEMENT  ° °Remove items at home which could result in a fall. This includes throw rugs or furniture in walking pathways °ICE to the affected joint every three hours while awake for 30 minutes at a time, for at least the first 3-5 days, and then as needed for pain and swelling.  Continue to use ice for pain and swelling. You may notice swelling that will progress down to the foot and ankle.  This is normal after surgery.  Elevate your leg when you are not up walking on it.   °Continue to use the breathing machine you got in the hospital (incentive spirometer) which will help keep your temperature down.  It is common for your temperature to cycle up and down following surgery, especially at night when you are not up moving around and exerting yourself.  The breathing machine keeps your lungs expanded and your temperature down. ° ° °DIET:  As you were doing prior to hospitalization, we recommend a well-balanced diet. ° °DRESSING / WOUND CARE / SHOWERING ° °You may shower 3 days after surgery, but keep the wounds dry during showering.  You may use an occlusive plastic wrap (Press'n Seal for example), NO SOAKING/SUBMERGING IN THE BATHTUB.  If the bandage gets wet, change with a clean dry gauze.  If the incision gets wet, pat the wound dry with a clean towel. ° °ACTIVITY ° °Increase activity slowly as tolerated, but follow the weight bearing instructions below.   °No driving for 6 weeks or until further direction given by your physician.  You cannot drive while taking narcotics.  °No lifting or carrying greater than 10 lbs. until further directed by your surgeon. °Avoid periods of inactivity such as sitting longer than an hour when not asleep. This helps prevent blood clots.  °You may return to work once you are authorized by your doctor.  ° ° ° °WEIGHT BEARING  ° °Weight bearing as tolerated with assist device (walker, cane, etc) as directed, use it as long as suggested by your surgeon or  therapist, typically at least 4-6 weeks. ° ° °EXERCISES ° °Results after joint replacement surgery are often greatly improved when you follow the exercise, range of motion and muscle strengthening exercises prescribed by your doctor. Safety measures are also important to protect the joint from further injury. Any time any of these exercises cause you to have increased pain or swelling, decrease what you are doing until you are comfortable again and then slowly increase them. If you have problems or questions, call your caregiver or physical therapist for advice.  ° °Rehabilitation is important following a joint replacement. After just a few days of immobilization, the muscles of the leg can become weakened and shrink (atrophy).  These exercises are designed to build up the tone and strength of the thigh and leg muscles and to improve motion. Often times heat used for twenty to thirty minutes before working out will loosen up your tissues and help with improving the range of motion but do not use heat for the first two weeks following surgery (sometimes heat can increase post-operative swelling).  ° °These exercises can be done on a training (exercise) mat, on the floor, on a table or on a bed. Use whatever works the best and is most comfortable for you.    Use music or television while you are exercising so that the exercises are a pleasant break in your day. This will make your life better with the exercises acting   as a break in your routine that you can look forward to.   Perform all exercises about fifteen times, three times per day or as directed.  You should exercise both the operative leg and the other leg as well. ° °Exercises include: °  °Quad Sets - Tighten up the muscle on the front of the thigh (Quad) and hold for 5-10 seconds.   °Straight Leg Raises - With your knee straight (if you were given a brace, keep it on), lift the leg to 60 degrees, hold for 3 seconds, and slowly lower the leg.  Perform this  exercise against resistance later as your leg gets stronger.  °Leg Slides: Lying on your back, slowly slide your foot toward your buttocks, bending your knee up off the floor (only go as far as is comfortable). Then slowly slide your foot back down until your leg is flat on the floor again.  °Angel Wings: Lying on your back spread your legs to the side as far apart as you can without causing discomfort.  °Hamstring Strength:  Lying on your back, push your heel against the floor with your leg straight by tightening up the muscles of your buttocks.  Repeat, but this time bend your knee to a comfortable angle, and push your heel against the floor.  You may put a pillow under the heel to make it more comfortable if necessary.  ° °A rehabilitation program following joint replacement surgery can speed recovery and prevent re-injury in the future due to weakened muscles. Contact your doctor or a physical therapist for more information on knee rehabilitation.  ° ° °CONSTIPATION ° °Constipation is defined medically as fewer than three stools per week and severe constipation as less than one stool per week.  Even if you have a regular bowel pattern at home, your normal regimen is likely to be disrupted due to multiple reasons following surgery.  Combination of anesthesia, postoperative narcotics, change in appetite and fluid intake all can affect your bowels.  ° °YOU MUST use at least one of the following options; they are listed in order of increasing strength to get the job done.  They are all available over the counter, and you may need to use some, POSSIBLY even all of these options:   ° °Drink plenty of fluids (prune juice may be helpful) and high fiber foods °Colace 100 mg by mouth twice a day  °Senokot for constipation as directed and as needed Dulcolax (bisacodyl), take with full glass of water  °Miralax (polyethylene glycol) once or twice a day as needed. ° °If you have tried all these things and are unable to have a  bowel movement in the first 3-4 days after surgery call either your surgeon or your primary doctor.   ° °If you experience loose stools or diarrhea, hold the medications until you stool forms back up.  If your symptoms do not get better within 1 week or if they get worse, check with your doctor.  If you experience "the worst abdominal pain ever" or develop nausea or vomiting, please contact the office immediately for further recommendations for treatment. ° ° °ITCHING:  If you experience itching with your medications, try taking only a single pain pill, or even half a pain pill at a time.  You can also use Benadryl over the counter for itching or also to help with sleep.  ° °TED HOSE STOCKINGS:  Use stockings on both legs until for at least 2 weeks or as directed by   physician office. They may be removed at night for sleeping. ° °MEDICATIONS:  See your medication summary on the “After Visit Summary” that nursing will review with you.  You may have some home medications which will be placed on hold until you complete the course of blood thinner medication.  It is important for you to complete the blood thinner medication as prescribed. ° °PRECAUTIONS:  If you experience chest pain or shortness of breath - call 911 immediately for transfer to the hospital emergency department.  ° °If you develop a fever greater that 101 F, purulent drainage from wound, increased redness or drainage from wound, foul odor from the wound/dressing, or calf pain - CONTACT YOUR SURGEON.   °                                                °FOLLOW-UP APPOINTMENTS:  If you do not already have a post-op appointment, please call the office for an appointment to be seen by your surgeon.  Guidelines for how soon to be seen are listed in your “After Visit Summary”, but are typically between 1-4 weeks after surgery. ° °OTHER INSTRUCTIONS:  ° °Knee Replacement:  Do not place pillow under knee, focus on keeping the knee straight while resting.   ° ° °POST-OPERATIVE OPIOID TAPER INSTRUCTIONS: °It is important to wean off of your opioid medication as soon as possible. If you do not need pain medication after your surgery it is ok to stop day one. °Opioids include: °Codeine, Hydrocodone(Norco, Vicodin), Oxycodone(Percocet, oxycontin) and hydromorphone amongst others.  °Long term and even short term use of opiods can cause: °Increased pain response °Dependence °Constipation °Depression °Respiratory depression °And more.  °Withdrawal symptoms can include °Flu like symptoms °Nausea, vomiting °And more °Techniques to manage these symptoms °Hydrate well °Eat regular healthy meals °Stay active °Use relaxation techniques(deep breathing, meditating, yoga) °Do Not substitute Alcohol to help with tapering °If you have been on opioids for less than two weeks and do not have pain than it is ok to stop all together.  °Plan to wean off of opioids °This plan should start within one week post op of your joint replacement. °Maintain the same interval or time between taking each dose and first decrease the dose.  °Cut the total daily intake of opioids by one tablet each day °Next start to increase the time between doses. °The last dose that should be eliminated is the evening dose.  ° °MAKE SURE YOU:  °Understand these instructions.  °Get help right away if you are not doing well or get worse.  ° ° °Thank you for letting us be a part of your medical care team.  It is a privilege we respect greatly.  We hope these instructions will help you stay on track for a fast and full recovery!  ° ° °  °

## 2022-12-25 NOTE — Anesthesia Procedure Notes (Signed)
Procedure Name: Intubation Date/Time: 12/25/2022 9:53 AM  Performed by: Caren Macadam, CRNAPre-anesthesia Checklist: Patient identified, Emergency Drugs available, Suction available and Patient being monitored Patient Re-evaluated:Patient Re-evaluated prior to induction Oxygen Delivery Method: Circle system utilized Preoxygenation: Pre-oxygenation with 100% oxygen Induction Type: IV induction Ventilation: Mask ventilation without difficulty Laryngoscope Size: 2 and Miller Grade View: Grade II Tube type: Oral Tube size: 7.5 mm Number of attempts: 1 Airway Equipment and Method: Stylet and Oral airway Placement Confirmation: ETT inserted through vocal cords under direct vision, positive ETCO2 and breath sounds checked- equal and bilateral Secured at: 22 cm Tube secured with: Tape Dental Injury: Teeth and Oropharynx as per pre-operative assessment

## 2022-12-25 NOTE — Anesthesia Postprocedure Evaluation (Signed)
Anesthesia Post Note  Patient: [name]  Procedure(s) Performed: TOTAL HIP ARTHROPLASTY (Right: Hip)     Patient location during evaluation: PACU Anesthesia Type: General Level of consciousness: awake and alert Pain management: pain level controlled Vital Signs Assessment: post-procedure vital signs reviewed and stable Respiratory status: spontaneous breathing, nonlabored ventilation, respiratory function stable and patient connected to nasal cannula oxygen Cardiovascular status: blood pressure returned to baseline and stable Postop Assessment: no apparent nausea or vomiting Anesthetic complications: no   No notable events documented.  Last Vitals:  Vitals:   12/25/22 1600 12/25/22 1615  BP: (!) 147/79 133/79  Pulse: 90 84  Resp: 20 13  Temp:    SpO2: (!) 89% 90%    Last Pain:  Vitals:   12/25/22 1543  TempSrc:   PainSc: 8                  Darnita Woodrum S

## 2022-12-25 NOTE — Interval H&P Note (Signed)
History and Physical Interval Note:  12/25/2022 8:38 AM  [name]  has presented today for surgery, with the diagnosis of djd right hip.  The various methods of treatment have been discussed with the patient and family. After consideration of risks, benefits and other options for treatment, the patient has consented to  Procedure(s): TOTAL HIP ARTHROPLASTY (Right) as a surgical intervention.  The patient's history has been reviewed, patient examined, no change in status, stable for surgery.  I have reviewed the patient's chart and labs.  Questions were answered to the patient's satisfaction.     Eulas Post

## 2022-12-25 NOTE — Progress Notes (Signed)
Notified [facility] unable to keep patients sats above 87% on RA.  This RN has worked with Facilities manager with this patient.  Patient would like to go home.  MDA wants this RN to call Dr. Dion Saucier.  Dr. Dion Saucier also notified of the situation with the patient and he is currently in the OR and will call back when he is done with the case he is doing.

## 2022-12-25 NOTE — Op Note (Signed)
12/25/2022  11:28 AM  PATIENT:  [name]   MRN: [id number]  PRE-OPERATIVE DIAGNOSIS: Right hip primary localized osteoarthritis  POST-OPERATIVE DIAGNOSIS:  same  PROCEDURE:  Procedure(s): TOTAL HIP ARTHROPLASTY  PREOPERATIVE INDICATIONS:    Shaun LUBERDA is an 62 y.o. male who has a diagnosis of right hip primary localized osteoarthritis and elected for surgical management after failing conservative treatment.  The risks benefits and alternatives were discussed with the patient including but not limited to the risks of nonoperative treatment, versus surgical intervention including infection, bleeding, nerve injury, periprosthetic fracture, the need for revision surgery, dislocation, leg length discrepancy, blood clots, cardiopulmonary complications, morbidity, mortality, among others, and they were willing to proceed.     OPERATIVE REPORT     SURGEON:  Teryl Lucy, MD    ASSISTANT:  Janine Ores, PA-C, (Present throughout the entire procedure,  necessary for completion of procedure in a timely manner, assisting with retraction, instrumentation, and closure)     ANESTHESIA: General  ESTIMATED BLOOD LOSS: 250 mL    COMPLICATIONS:  None.     UNIQUE ASPECTS OF THE CASE: He had a fair amount of urinary retention, and when we positioned him initially he had some urinary release, so we put in a Foley catheter and had 700 mL of urine output immediately.  The femoral neck was fairly long, I took what I thought was a fairly conservative neck cut but had excellent access to the acetabulum, although I did have excellent capsular releases.  I had about 1 nozzle worth of anterior wall showing, and posteriorly I was matching very nicely at the 9 o'clock position, he had a little bit of osteophyte overhanging inferiorly, superiorly I was nearly matching exactly with the bone, with just a few beads showing.  I was slightly introverted on the femur, and he had excellent stability.  I debated his  length, the tissues closed anatomically, he still had a little bit of shuck, but I felt like more length might be over lengthening him.  Bone quality was not particularly good.  He reamed easily to a size 6, even a 7, I placed a 6 and left half of the tooth showing.  COMPONENTS:  [employer]uy [employer] fit femur size 6 with a 36 mm + 5 ceramic head ball and a Gription Acetabular shell size 54 with an apex hole eliminator and a +4 neutral polyethylene liner.    PROCEDURE IN DETAIL:   The patient was met in the holding area and  identified.  The appropriate hip was identified and marked at the operative site.  The patient was then transported to the OR  and  placed under anesthesia.  At that point, the patient was  placed in the lateral decubitus position with the operative side up and  secured to the operating room table and all bony prominences padded.     The operative lower extremity was prepped from the iliac crest to the distal leg.  Sterile draping was performed.  Time out was performed prior to incision.      A routine posterolateral approach was utilized via sharp dissection  carried down to the subcutaneous tissue.  Gross bleeders were Bovie coagulated.  The iliotibial band was identified and incised along the length of the skin incision.  Self-retaining retractors were  inserted.  With the hip internally rotated, the short external rotators  were identified. The piriformis and capsule was tagged with Ethibond, and the hip capsule released in a T-type  fashion.  The femoral neck was exposed, and I resected the femoral neck using the appropriate jig. This was performed at approximately a thumb's breadth above the lesser trochanter.    I then exposed the deep acetabulum, cleared out any tissue including the ligamentum teres.  A wing retractor was placed.  After adequate visualization, I excised the labrum, and then sequentially reamed.  I placed the trial acetabulum, which seated nicely, and  then impacted the real cup into place.  Appropriate version and inclination was confirmed clinically matching their bony anatomy, and also with the use of the jig.  My fixation felt excellent and I did not feel like I needed an additional screw.  A trial polyethylene liner was placed and the wing retractor removed.    I then prepared the proximal femur using the cookie-cutter, the lateralizing reamer, and then sequentially reamed and broached.  A trial broach, neck, and head was utilized, and I reduced the hip and it was found to have excellent stability with functional range of motion. The trial components were then removed, and the real polyethylene liner was placed.  I then impacted the real femoral prosthesis into place into the appropriate version, slightly anteverted to the normal anatomy, and I impacted the real head ball into place. The hip was then reduced and taken through functional range of motion and found to have excellent stability. Leg lengths were restored.  I then used a 2 mm drill bits to pass the Ethibond suture from the capsule and piriformis through the greater trochanter, and secured this. Excellent posterior capsular repair was achieved. I also closed the T in the capsule.  I then irrigated the hip copiously again with pulse lavage, and repaired the fascia with Vicryl, followed by Vicryl for the subcutaneous tissue, Monocryl for the skin, Steri-Strips and sterile gauze. The wounds were injected. The patient was then awakened and returned to PACU in stable and satisfactory condition. There were no complications.  Teryl Lucy, MD Orthopedic Surgeon [phone]   12/25/2022 11:28 AM

## 2022-12-25 NOTE — Progress Notes (Signed)
Spoke to MDA about patients sats currently ranging 89-93.  PA ok with that for patient to go home.  MDA ok too.  Will move patient to phase II.

## 2022-12-25 NOTE — Progress Notes (Signed)
Notified [facility] patients BP 174/85 no new orders at this time.  Also notified [facility] patients CBG 200.  Patient is supposed to go home and MDA ok with patient getting back on his home meds when he leaves here.

## 2022-12-25 NOTE — Progress Notes (Signed)
Spoke with [name] to reconcile discharge AVS and to verify patient can be discharged home with oxygen sats between 87-90%. PA cleared patient to be discharged home.

## 2022-12-26 ENCOUNTER — Encounter (HOSPITAL_COMMUNITY): Payer: Self-pay | Admitting: Orthopedic Surgery

## 2022-12-27 ENCOUNTER — Other Ambulatory Visit: Payer: Self-pay

## 2023-01-03 DIAGNOSIS — M25551 Pain in right hip: Secondary | ICD-10-CM | POA: Diagnosis not present

## 2023-01-03 DIAGNOSIS — M25651 Stiffness of right hip, not elsewhere classified: Secondary | ICD-10-CM | POA: Diagnosis not present

## 2023-01-03 DIAGNOSIS — M6281 Muscle weakness (generalized): Secondary | ICD-10-CM | POA: Diagnosis not present

## 2023-01-03 DIAGNOSIS — R2689 Other abnormalities of gait and mobility: Secondary | ICD-10-CM | POA: Diagnosis not present

## 2023-01-07 DIAGNOSIS — M1611 Unilateral primary osteoarthritis, right hip: Secondary | ICD-10-CM | POA: Diagnosis not present

## 2023-01-09 ENCOUNTER — Other Ambulatory Visit: Payer: Self-pay | Admitting: General Practice

## 2023-01-09 DIAGNOSIS — M25551 Pain in right hip: Secondary | ICD-10-CM | POA: Diagnosis not present

## 2023-01-09 DIAGNOSIS — M6281 Muscle weakness (generalized): Secondary | ICD-10-CM | POA: Diagnosis not present

## 2023-01-09 DIAGNOSIS — R2689 Other abnormalities of gait and mobility: Secondary | ICD-10-CM | POA: Diagnosis not present

## 2023-01-09 DIAGNOSIS — M25651 Stiffness of right hip, not elsewhere classified: Secondary | ICD-10-CM | POA: Diagnosis not present

## 2023-01-11 DIAGNOSIS — M25551 Pain in right hip: Secondary | ICD-10-CM | POA: Diagnosis not present

## 2023-01-11 DIAGNOSIS — M25651 Stiffness of right hip, not elsewhere classified: Secondary | ICD-10-CM | POA: Diagnosis not present

## 2023-01-11 DIAGNOSIS — R2689 Other abnormalities of gait and mobility: Secondary | ICD-10-CM | POA: Diagnosis not present

## 2023-01-11 DIAGNOSIS — M6281 Muscle weakness (generalized): Secondary | ICD-10-CM | POA: Diagnosis not present

## 2023-01-23 DIAGNOSIS — M79661 Pain in right lower leg: Secondary | ICD-10-CM | POA: Diagnosis not present

## 2023-01-24 ENCOUNTER — Other Ambulatory Visit (HOSPITAL_COMMUNITY): Payer: Self-pay | Admitting: Orthopedic Surgery

## 2023-01-24 ENCOUNTER — Ambulatory Visit (HOSPITAL_COMMUNITY)
Admission: RE | Admit: 2023-01-24 | Discharge: 2023-01-24 | Disposition: A | Discharge status: Home / Self Care | Payer: 59 | Source: Ambulatory Visit | Attending: Cardiology | Admitting: Cardiology

## 2023-01-24 DIAGNOSIS — M79661 Pain in right lower leg: Secondary | ICD-10-CM | POA: Diagnosis not present

## 2023-01-24 DIAGNOSIS — M79604 Pain in right leg: Secondary | ICD-10-CM

## 2023-01-24 DIAGNOSIS — M7989 Other specified soft tissue disorders: Secondary | ICD-10-CM

## 2023-01-30 DIAGNOSIS — M1611 Unilateral primary osteoarthritis, right hip: Secondary | ICD-10-CM | POA: Diagnosis not present

## 2023-02-09 ENCOUNTER — Encounter (HOSPITAL_COMMUNITY): Payer: Self-pay | Admitting: *Deleted

## 2023-02-09 ENCOUNTER — Emergency Department (HOSPITAL_COMMUNITY): Payer: 59

## 2023-02-09 ENCOUNTER — Inpatient Hospital Stay (HOSPITAL_COMMUNITY)
Admission: EM | Admit: 2023-02-09 | Discharge: 2023-02-11 | DRG: 193 | Disposition: A | Discharge status: Home / Self Care | Payer: 59 | Attending: Internal Medicine | Admitting: Internal Medicine

## 2023-02-09 ENCOUNTER — Other Ambulatory Visit: Payer: Self-pay

## 2023-02-09 DIAGNOSIS — E11649 Type 2 diabetes mellitus with hypoglycemia without coma: Secondary | ICD-10-CM | POA: Insufficient documentation

## 2023-02-09 DIAGNOSIS — Z86718 Personal history of other venous thrombosis and embolism: Secondary | ICD-10-CM

## 2023-02-09 DIAGNOSIS — I10 Essential (primary) hypertension: Secondary | ICD-10-CM | POA: Diagnosis present

## 2023-02-09 DIAGNOSIS — S99911A Unspecified injury of right ankle, initial encounter: Secondary | ICD-10-CM | POA: Diagnosis not present

## 2023-02-09 DIAGNOSIS — E1151 Type 2 diabetes mellitus with diabetic peripheral angiopathy without gangrene: Secondary | ICD-10-CM | POA: Diagnosis present

## 2023-02-09 DIAGNOSIS — R0902 Hypoxemia: Secondary | ICD-10-CM | POA: Diagnosis not present

## 2023-02-09 DIAGNOSIS — M109 Gout, unspecified: Secondary | ICD-10-CM | POA: Diagnosis present

## 2023-02-09 DIAGNOSIS — Y906 Blood alcohol level of 120-199 mg/100 ml: Secondary | ICD-10-CM | POA: Diagnosis present

## 2023-02-09 DIAGNOSIS — K219 Gastro-esophageal reflux disease without esophagitis: Secondary | ICD-10-CM | POA: Diagnosis not present

## 2023-02-09 DIAGNOSIS — R197 Diarrhea, unspecified: Secondary | ICD-10-CM | POA: Diagnosis not present

## 2023-02-09 DIAGNOSIS — L03115 Cellulitis of right lower limb: Secondary | ICD-10-CM | POA: Diagnosis not present

## 2023-02-09 DIAGNOSIS — Z86010 Personal history of colonic polyps: Secondary | ICD-10-CM

## 2023-02-09 DIAGNOSIS — J189 Pneumonia, unspecified organism: Secondary | ICD-10-CM | POA: Diagnosis not present

## 2023-02-09 DIAGNOSIS — G8929 Other chronic pain: Secondary | ICD-10-CM | POA: Diagnosis not present

## 2023-02-09 DIAGNOSIS — Z981 Arthrodesis status: Secondary | ICD-10-CM

## 2023-02-09 DIAGNOSIS — Z951 Presence of aortocoronary bypass graft: Secondary | ICD-10-CM

## 2023-02-09 DIAGNOSIS — I2510 Atherosclerotic heart disease of native coronary artery without angina pectoris: Secondary | ICD-10-CM | POA: Diagnosis not present

## 2023-02-09 DIAGNOSIS — R7989 Other specified abnormal findings of blood chemistry: Secondary | ICD-10-CM | POA: Insufficient documentation

## 2023-02-09 DIAGNOSIS — J440 Chronic obstructive pulmonary disease with acute lower respiratory infection: Secondary | ICD-10-CM | POA: Diagnosis not present

## 2023-02-09 DIAGNOSIS — R81 Glycosuria: Secondary | ICD-10-CM | POA: Diagnosis not present

## 2023-02-09 DIAGNOSIS — Z7982 Long term (current) use of aspirin: Secondary | ICD-10-CM

## 2023-02-09 DIAGNOSIS — F17210 Nicotine dependence, cigarettes, uncomplicated: Secondary | ICD-10-CM | POA: Diagnosis present

## 2023-02-09 DIAGNOSIS — Z7141 Alcohol abuse counseling and surveillance of alcoholic: Secondary | ICD-10-CM

## 2023-02-09 DIAGNOSIS — Z79899 Other long term (current) drug therapy: Secondary | ICD-10-CM

## 2023-02-09 DIAGNOSIS — I872 Venous insufficiency (chronic) (peripheral): Secondary | ICD-10-CM | POA: Diagnosis present

## 2023-02-09 DIAGNOSIS — R4781 Slurred speech: Secondary | ICD-10-CM | POA: Diagnosis not present

## 2023-02-09 DIAGNOSIS — A084 Viral intestinal infection, unspecified: Secondary | ICD-10-CM | POA: Diagnosis present

## 2023-02-09 DIAGNOSIS — Z96641 Presence of right artificial hip joint: Secondary | ICD-10-CM | POA: Diagnosis not present

## 2023-02-09 DIAGNOSIS — I700 Atherosclerosis of aorta: Secondary | ICD-10-CM | POA: Diagnosis not present

## 2023-02-09 DIAGNOSIS — E876 Hypokalemia: Secondary | ICD-10-CM | POA: Diagnosis not present

## 2023-02-09 DIAGNOSIS — E782 Mixed hyperlipidemia: Secondary | ICD-10-CM | POA: Diagnosis present

## 2023-02-09 DIAGNOSIS — Z7902 Long term (current) use of antithrombotics/antiplatelets: Secondary | ICD-10-CM

## 2023-02-09 DIAGNOSIS — R972 Elevated prostate specific antigen [PSA]: Secondary | ICD-10-CM | POA: Diagnosis present

## 2023-02-09 DIAGNOSIS — J9601 Acute respiratory failure with hypoxia: Secondary | ICD-10-CM | POA: Insufficient documentation

## 2023-02-09 DIAGNOSIS — E162 Hypoglycemia, unspecified: Secondary | ICD-10-CM

## 2023-02-09 DIAGNOSIS — E1140 Type 2 diabetes mellitus with diabetic neuropathy, unspecified: Secondary | ICD-10-CM | POA: Diagnosis present

## 2023-02-09 DIAGNOSIS — Z7984 Long term (current) use of oral hypoglycemic drugs: Secondary | ICD-10-CM

## 2023-02-09 DIAGNOSIS — R918 Other nonspecific abnormal finding of lung field: Secondary | ICD-10-CM | POA: Diagnosis not present

## 2023-02-09 LAB — COMPREHENSIVE METABOLIC PANEL
ALT: 15 U/L (ref 0–44)
AST: 14 U/L — ABNORMAL LOW (ref 15–41)
Albumin: 3.7 g/dL (ref 3.5–5.0)
Alkaline Phosphatase: 68 U/L (ref 38–126)
Anion gap: 11 (ref 5–15)
BUN: 8 mg/dL (ref 8–23)
CO2: 23 mmol/L (ref 22–32)
Calcium: 8.4 mg/dL — ABNORMAL LOW (ref 8.9–10.3)
Chloride: 95 mmol/L — ABNORMAL LOW (ref 98–111)
Creatinine, Ser: 0.68 mg/dL (ref 0.61–1.24)
GFR, Estimated: >60 mL/min (ref >60)
Glucose, Bld: 59 mg/dL — ABNORMAL LOW (ref 70–99)
Potassium: 3.1 mmol/L — ABNORMAL LOW (ref 3.5–5.1)
Sodium: 129 mmol/L — ABNORMAL LOW (ref 135–145)
Total Bilirubin: 0.3 mg/dL (ref 0.3–1.2)
Total Protein: 7 g/dL (ref 6.5–8.1)

## 2023-02-09 LAB — CBC WITH DIFFERENTIAL/PLATELET
Abs Immature Granulocytes: 0.02 10*3/uL (ref 0.00–0.07)
Basophils Absolute: 0.1 10*3/uL (ref 0.0–0.1)
Basophils Relative: 1 %
Eosinophils Absolute: 0.2 10*3/uL (ref 0.0–0.5)
Eosinophils Relative: 3 %
HCT: 39.8 % (ref 39.0–52.0)
Hemoglobin: 12.8 g/dL — ABNORMAL LOW (ref 13.0–17.0)
Immature Granulocytes: 0 %
Lymphocytes Relative: 20 %
Lymphs Abs: 1.9 10*3/uL (ref 0.7–4.0)
MCH: 29.6 pg (ref 26.0–34.0)
MCHC: 32.2 g/dL (ref 30.0–36.0)
MCV: 91.9 fL (ref 80.0–100.0)
Monocytes Absolute: 0.9 10*3/uL (ref 0.1–1.0)
Monocytes Relative: 9 %
Neutro Abs: 6.4 10*3/uL (ref 1.7–7.7)
Neutrophils Relative %: 67 %
Platelets: 150 10*3/uL (ref 150–400)
RBC: 4.33 MIL/uL (ref 4.22–5.81)
RDW: 13.8 % (ref 11.5–15.5)
WBC: 9.4 10*3/uL (ref 4.0–10.5)
nRBC: 0 % (ref 0.0–0.2)

## 2023-02-09 LAB — CBG MONITORING, ED: Glucose-Capillary: 64 mg/dL — ABNORMAL LOW (ref 70–99)

## 2023-02-09 LAB — D-DIMER, QUANTITATIVE: D-Dimer, Quant: 2.26 ug/mL-FEU — ABNORMAL HIGH (ref 0.00–0.50)

## 2023-02-09 MED ORDER — LACTATED RINGERS IV BOLUS
1000.0000 mL | Freq: Once | INTRAVENOUS | Status: AC
  Administered 2023-02-09: 1000 mL via INTRAVENOUS

## 2023-02-09 MED ORDER — DEXTROSE 50 % IV SOLN
INTRAVENOUS | Status: AC
  Administered 2023-02-09: 50 mL via INTRAVENOUS
  Filled 2023-02-09: qty 50

## 2023-02-09 MED ORDER — DEXTROSE 50 % IV SOLN: 1.0000 | Freq: Once | INTRAVENOUS | Status: AC

## 2023-02-09 NOTE — ED Triage Notes (Signed)
Pt c/o right foot pain x 2 weeks and describes it as a throbbing pain like a toothache  Upon arrival to ED room pt is slurry words and lethargic with an episode of incontinence of diarrhea; pt's SO states pt has been having episodes of diarrhea all day  Pt admits to drinking to 2 beers today  Pt assisted to bed and cleaned up with new sheets and gown

## 2023-02-09 NOTE — ED Provider Notes (Signed)
[place] EMERGENCY DEPARTMENT AT [facility] Provider Note   CSN: [id number] Arrival date & time: 02/09/23  2310     History {Add pertinent medical, surgical, social history, OB history to HPI:1} Chief Complaint  Patient presents with   Foot Pain    [name] is a 62 y.o. male.  62 year old male presents ER with foot pain.  Patient states that it hurt for couple weeks.  It is progressively worsened.  He feels like the worst to think he is ever had pain in his foot.  Has had mild swelling to nighttime as well and now started have some erythema around his ankle.  No fevers or injuries.  Reviewed the records it appears he had a DVT study back on June 20 for swelling and pain which was negative.  Did have hip surgery on that leg back at the end of May.  States that the swelling and pain started shortly after that.  Sounds like he also has episodes of hypoglycemia and his wife thinks that is why he had uncontrollable diarrhea today.  No syncope.  No seizure-like activity.  He is slurring his words quite badly however states he is only at 1 or 2 drinks and his wife states that she thinks this happens when his blood sugar gets low as well.  Does have a history of an aneurysm status post coiling.  Also has a history of open heart surgery for multiple valve replacements.   Foot Pain       Home Medications Prior to Admission medications   Medication Sig Start Date End Date Taking? Authorizing Provider  acetaminophen (TYLENOL) 650 MG CR tablet Take 650 mg by mouth every 8 (eight) hours as needed for pain.    [provider]  aspirin EC 81 MG EC tablet Take 1 tablet (81 mg total) by mouth daily. 09/11/18   Catarina Hartshorn, MD  Blood Glucose Monitoring Suppl (ONETOUCH VERIO FLEX SYSTEM) w/Device KIT  11/21/20   [provider]  carboxymethylcellulose (REFRESH PLUS) 0.5 % SOLN Place 1 drop into both eyes 3 (three) times daily as needed (dry eyes).    [provider]  clopidogrel (PLAVIX) 75 MG tablet Take 1 tablet (75 mg total) by mouth daily with breakfast. 05/03/15   Hilty, Lisette Abu, MD  cyclobenzaprine (FLEXERIL) 10 MG tablet Take 1 tablet (10 mg total) by mouth 3 (three) times daily as needed for muscle spasms. 10/31/17   Tressie Stalker, MD  empagliflozin (JARDIANCE) 25 MG TABS tablet Take 25 mg by mouth daily.    [provider]  gabapentin (NEURONTIN) 300 MG capsule Take 300 mg by mouth 3 (three) times daily. 08/19/18   [provider]  GLIMEPIRIDE PO Take 1 tablet by mouth in the morning and at bedtime.    [provider]  isosorbide mononitrate (IMDUR) 30 MG 24 hr tablet Take 1 tablet (30 mg total) by mouth daily. 04/25/22   Runell Gess, MD  Lancets [facility] DELICA PLUS [place]) MISC Apply topically daily. 02/07/21   [provider]  losartan (COZAAR) 50 MG tablet Take 50 mg by mouth daily. 04/27/22   [provider]  Loteprednol Etabonate (EYSUVIS) 0.25 % SUSP Place 1 drop into both eyes at bedtime as needed (Dry Eye).    [provider]  metoprolol succinate (TOPROL-XL) 25 MG 24 hr tablet TAKE 1 TABLET BY MOUTH ONCE A DAY. Patient taking differently: Take 25 mg by mouth daily. 09/26/22   Hilty,  Lisette Abu, MD  Na Sulfate-K Sulfate-Mg Sulf 17.5-3.13-1.6 GM/177ML SOLN As directed 09/19/22   Rourk, Gerrit Friends, MD  ondansetron (ZOFRAN) 4 MG tablet Take 1 tablet (4 mg total) by mouth every 8 (eight) hours as needed for nausea or vomiting. 12/25/22   Armida Sans, PA-C  [facility] VERIO test strip 2 (two) times daily. 11/21/20   [provider]  oxyCODONE-acetaminophen (PERCOCET) 10-325 MG tablet Take 1 tablet by mouth 4 (four) times daily as needed for pain. 02/20/21   [provider]  PROAIR HFA 108 (90 BASE) MCG/ACT inhaler Inhale 2 puffs into the lungs every 6 (six) hours as needed for wheezing or shortness of breath.  08/14/13   [provider]  rosuvastatin (CRESTOR) 40 MG  tablet Take 1 tablet (40 mg total) by mouth daily. 09/10/18   Catarina Hartshorn, MD  zolpidem (AMBIEN CR) 12.5 MG CR tablet Take 12.5 mg by mouth at bedtime. 06/15/20   [provider]      Allergies    Patient has no known allergies.    Review of Systems   Review of Systems  Physical Exam Updated Vital Signs BP 130/64 (BP Location: Right Arm)   Pulse 76   Temp 97.8 F (36.6 C) (Oral)   Resp (!) 25   Ht 5\' 11"  (1.803 m)   Wt 77.1 kg   SpO2 91% Comment: pt placed on 2L of O2 and O2 sats increased to 96%  BMI 23.71 kg/m  Physical Exam Vitals and nursing note reviewed.  Constitutional:      Appearance: He is well-developed.  HENT:     Head: Normocephalic and atraumatic.  Eyes:     Pupils: Pupils are equal, round, and reactive to light.  Cardiovascular:     Rate and Rhythm: Normal rate.  Pulmonary:     Effort: Pulmonary effort is normal. No respiratory distress.  Abdominal:     General: Abdomen is flat. There is no distension.  Musculoskeletal:        General: Swelling present. Normal range of motion.     Cervical back: Normal range of motion.     Right lower leg: Edema (edema around right ankle and distal tib/fib area with mild erythema, induration and ttp) present.  Skin:    General: Skin is warm and dry.  Neurological:     General: No focal deficit present.     Mental Status: He is alert.     ED Results / Procedures / Treatments   Labs (all labs ordered are listed, but only abnormal results are displayed) Labs Reviewed  CBC WITH DIFFERENTIAL/PLATELET - Abnormal; Notable for the following components:      Result Value   Hemoglobin 12.8 (*)    All other components within normal limits  CBG MONITORING, ED - Abnormal; Notable for the following components:   Glucose-Capillary 64 (*)    All other components within normal limits  URINALYSIS, ROUTINE W REFLEX MICROSCOPIC  COMPREHENSIVE METABOLIC PANEL  D-DIMER, QUANTITATIVE  ETHANOL  CBG MONITORING, ED     EKG EKG Interpretation Date/Time:  Saturday February 09 2023 23:32:40 EDT Ventricular Rate:  78 PR Interval:  222 QRS Duration:  120 QT Interval:  409 QTC Calculation: 466 R Axis:   -84  Text Interpretation: Sinus rhythm Prolonged PR interval Nonspecific IVCD with LAD Anteroseptal infarct, old Confirmed by Marily Memos [phone]) on 02/09/2023 11:37:44 PM  Radiology No results found.  Procedures Procedures    Medications Ordered in ED Medications  lactated  ringers bolus 1,000 mL (has no administration in time range)  dextrose 50 % solution (has no administration in time range)    ED Course/ Medical Decision Making/ A&P                             Medical Decision Making Amount and/or Complexity of Data Reviewed Labs: ordered. Radiology: ordered.  Glucose in the 60s and wife states that he is often symptomatic with that.  D50 given.  Patient states that I wanted to beers but is significantly slurring his speech and smells strongly of alcohol so we will check ethanol level.  If this is relatively low and his blood sugars improved and he still slurring his speech like he has he may need a head CT with his history of aneurysm. He smokes and has a 30-pack-year smoking history sats were 91% on arrival and was tachypneic.  In association with a swollen erythematous painful right lower extremity concern for DVT/PE especially after having hip surgery.  Will check a D-dimer. Will also xray to ensure no occult injury.  ***  {Document critical care time when appropriate:1} {Document review of labs and clinical decision tools ie heart score, Chads2Vasc2 etc:1}  {Document your independent review of radiology images, and any outside records:1} {Document your discussion with family members, caretakers, and with consultants:1} {Document social determinants of health affecting pt's care:1} {Document your decision making why or why not admission, treatments were needed:1} Final Clinical  Impression(s) / ED Diagnoses Final diagnoses:  None    Rx / DC Orders ED Discharge Orders     None

## 2023-02-10 ENCOUNTER — Emergency Department (HOSPITAL_COMMUNITY): Payer: 59

## 2023-02-10 DIAGNOSIS — J9601 Acute respiratory failure with hypoxia: Secondary | ICD-10-CM | POA: Diagnosis not present

## 2023-02-10 DIAGNOSIS — F17210 Nicotine dependence, cigarettes, uncomplicated: Secondary | ICD-10-CM | POA: Diagnosis present

## 2023-02-10 DIAGNOSIS — A084 Viral intestinal infection, unspecified: Secondary | ICD-10-CM | POA: Diagnosis present

## 2023-02-10 DIAGNOSIS — I2510 Atherosclerotic heart disease of native coronary artery without angina pectoris: Secondary | ICD-10-CM | POA: Diagnosis not present

## 2023-02-10 DIAGNOSIS — E162 Hypoglycemia, unspecified: Secondary | ICD-10-CM | POA: Diagnosis present

## 2023-02-10 DIAGNOSIS — E1140 Type 2 diabetes mellitus with diabetic neuropathy, unspecified: Secondary | ICD-10-CM | POA: Diagnosis present

## 2023-02-10 DIAGNOSIS — R81 Glycosuria: Secondary | ICD-10-CM | POA: Diagnosis present

## 2023-02-10 DIAGNOSIS — I10 Essential (primary) hypertension: Secondary | ICD-10-CM | POA: Diagnosis not present

## 2023-02-10 DIAGNOSIS — Z86718 Personal history of other venous thrombosis and embolism: Secondary | ICD-10-CM | POA: Diagnosis not present

## 2023-02-10 DIAGNOSIS — I700 Atherosclerosis of aorta: Secondary | ICD-10-CM | POA: Diagnosis not present

## 2023-02-10 DIAGNOSIS — Z951 Presence of aortocoronary bypass graft: Secondary | ICD-10-CM | POA: Diagnosis not present

## 2023-02-10 DIAGNOSIS — E11649 Type 2 diabetes mellitus with hypoglycemia without coma: Secondary | ICD-10-CM | POA: Diagnosis not present

## 2023-02-10 DIAGNOSIS — G8929 Other chronic pain: Secondary | ICD-10-CM | POA: Diagnosis present

## 2023-02-10 DIAGNOSIS — R197 Diarrhea, unspecified: Secondary | ICD-10-CM | POA: Insufficient documentation

## 2023-02-10 DIAGNOSIS — J189 Pneumonia, unspecified organism: Secondary | ICD-10-CM | POA: Diagnosis present

## 2023-02-10 DIAGNOSIS — E876 Hypokalemia: Secondary | ICD-10-CM | POA: Insufficient documentation

## 2023-02-10 DIAGNOSIS — J440 Chronic obstructive pulmonary disease with acute lower respiratory infection: Secondary | ICD-10-CM | POA: Diagnosis present

## 2023-02-10 DIAGNOSIS — Y906 Blood alcohol level of 120-199 mg/100 ml: Secondary | ICD-10-CM | POA: Diagnosis present

## 2023-02-10 DIAGNOSIS — E782 Mixed hyperlipidemia: Secondary | ICD-10-CM | POA: Diagnosis not present

## 2023-02-10 DIAGNOSIS — R7989 Other specified abnormal findings of blood chemistry: Secondary | ICD-10-CM | POA: Insufficient documentation

## 2023-02-10 DIAGNOSIS — Z7141 Alcohol abuse counseling and surveillance of alcoholic: Secondary | ICD-10-CM | POA: Diagnosis not present

## 2023-02-10 DIAGNOSIS — L03115 Cellulitis of right lower limb: Secondary | ICD-10-CM | POA: Diagnosis not present

## 2023-02-10 DIAGNOSIS — E1151 Type 2 diabetes mellitus with diabetic peripheral angiopathy without gangrene: Secondary | ICD-10-CM | POA: Diagnosis present

## 2023-02-10 DIAGNOSIS — Z96641 Presence of right artificial hip joint: Secondary | ICD-10-CM | POA: Diagnosis present

## 2023-02-10 DIAGNOSIS — K219 Gastro-esophageal reflux disease without esophagitis: Secondary | ICD-10-CM | POA: Diagnosis not present

## 2023-02-10 DIAGNOSIS — I872 Venous insufficiency (chronic) (peripheral): Secondary | ICD-10-CM | POA: Diagnosis present

## 2023-02-10 DIAGNOSIS — R4781 Slurred speech: Secondary | ICD-10-CM | POA: Diagnosis present

## 2023-02-10 DIAGNOSIS — R918 Other nonspecific abnormal finding of lung field: Secondary | ICD-10-CM | POA: Diagnosis not present

## 2023-02-10 LAB — URINALYSIS, ROUTINE W REFLEX MICROSCOPIC
Bacteria, UA: NONE SEEN
Bilirubin Urine: NEGATIVE
Glucose, UA: >=500 mg/dL — AB
Hgb urine dipstick: NEGATIVE
Ketones, ur: NEGATIVE mg/dL
Leukocytes,Ua: NEGATIVE
Nitrite: NEGATIVE
Protein, ur: NEGATIVE mg/dL
Specific Gravity, Urine: 1.002 — ABNORMAL LOW (ref 1.005–1.030)
pH: 6 (ref 5.0–8.0)

## 2023-02-10 LAB — GLUCOSE, CAPILLARY
Glucose-Capillary: 177 mg/dL — ABNORMAL HIGH (ref 70–99)
Glucose-Capillary: 215 mg/dL — ABNORMAL HIGH (ref 70–99)
Glucose-Capillary: 123 mg/dL — ABNORMAL HIGH (ref 70–99)
Glucose-Capillary: 199 mg/dL — ABNORMAL HIGH (ref 70–99)

## 2023-02-10 LAB — CBG MONITORING, ED
Glucose-Capillary: 81 mg/dL (ref 70–99)
Glucose-Capillary: 97 mg/dL (ref 70–99)

## 2023-02-10 LAB — MAGNESIUM: Magnesium: 2.2 mg/dL (ref 1.7–2.4)

## 2023-02-10 LAB — PROCALCITONIN: Procalcitonin: <0.1 ng/mL

## 2023-02-10 LAB — ETHANOL: Alcohol, Ethyl (B): 140 mg/dL — ABNORMAL HIGH (ref <10)

## 2023-02-10 LAB — PHOSPHORUS: Phosphorus: 4.9 mg/dL — ABNORMAL HIGH (ref 2.5–4.6)

## 2023-02-10 LAB — MRSA NEXT GEN BY PCR, NASAL: MRSA by PCR Next Gen: NOT DETECTED

## 2023-02-10 LAB — HIV ANTIBODY (ROUTINE TESTING W REFLEX): HIV Screen 4th Generation wRfx: NONREACTIVE

## 2023-02-10 MED ORDER — DOXYCYCLINE HYCLATE 100 MG PO TABS
100.0000 mg | ORAL_TABLET | Freq: Once | ORAL | Status: AC
  Administered 2023-02-10: 100 mg via ORAL
  Filled 2023-02-10: qty 1

## 2023-02-10 MED ORDER — DM-GUAIFENESIN ER 30-600 MG PO TB12
1.0000 | ORAL_TABLET | Freq: Two times a day (BID) | ORAL | Status: DC
  Administered 2023-02-10 – 2023-02-11 (×3): 1 via ORAL
  Filled 2023-02-10 (×3): qty 1

## 2023-02-10 MED ORDER — CYCLOBENZAPRINE HCL 10 MG PO TABS: 10.0000 mg | ORAL_TABLET | Freq: Three times a day (TID) | ORAL | Status: DC | PRN

## 2023-02-10 MED ORDER — SODIUM CHLORIDE 0.9 % IV SOLN
2.0000 g | Freq: Once | INTRAVENOUS | Status: AC
  Administered 2023-02-10: 2 g via INTRAVENOUS
  Filled 2023-02-10: qty 20

## 2023-02-10 MED ORDER — ENOXAPARIN SODIUM 40 MG/0.4ML IJ SOSY
40.0000 mg | PREFILLED_SYRINGE | INTRAMUSCULAR | Status: DC
  Administered 2023-02-10 – 2023-02-11 (×2): 40 mg via SUBCUTANEOUS
  Filled 2023-02-10 (×2): qty 0.4

## 2023-02-10 MED ORDER — BUDESONIDE 0.25 MG/2ML IN SUSP
0.2500 mg | Freq: Two times a day (BID) | RESPIRATORY_TRACT | Status: DC
  Administered 2023-02-10 – 2023-02-11 (×3): 0.25 mg via RESPIRATORY_TRACT
  Filled 2023-02-10 (×3): qty 2

## 2023-02-10 MED ORDER — POTASSIUM CHLORIDE 10 MEQ/100ML IV SOLN
10.0000 meq | INTRAVENOUS | Status: AC
  Administered 2023-02-10 (×2): 10 meq via INTRAVENOUS
  Filled 2023-02-10: qty 100

## 2023-02-10 MED ORDER — ACETAMINOPHEN 650 MG RE SUPP: 650.0000 mg | Freq: Four times a day (QID) | RECTAL | Status: DC | PRN

## 2023-02-10 MED ORDER — ONDANSETRON HCL 4 MG PO TABS: 4.0000 mg | ORAL_TABLET | Freq: Four times a day (QID) | ORAL | Status: DC | PRN

## 2023-02-10 MED ORDER — SODIUM CHLORIDE 0.9 % IV SOLN
100.0000 mg | Freq: Two times a day (BID) | INTRAVENOUS | Status: DC
  Administered 2023-02-10 – 2023-02-11 (×2): 100 mg via INTRAVENOUS
  Filled 2023-02-10 (×5): qty 100

## 2023-02-10 MED ORDER — OXYCODONE-ACETAMINOPHEN 5-325 MG PO TABS
1.0000 | ORAL_TABLET | Freq: Four times a day (QID) | ORAL | Status: DC | PRN
  Administered 2023-02-10 (×2): 1 via ORAL
  Filled 2023-02-10 (×2): qty 1

## 2023-02-10 MED ORDER — ROSUVASTATIN CALCIUM 20 MG PO TABS
40.0000 mg | ORAL_TABLET | Freq: Every day | ORAL | Status: DC
  Administered 2023-02-10 – 2023-02-11 (×2): 40 mg via ORAL
  Filled 2023-02-10 (×2): qty 2

## 2023-02-10 MED ORDER — OXYCODONE HCL 5 MG PO TABS
5.0000 mg | ORAL_TABLET | Freq: Four times a day (QID) | ORAL | Status: DC | PRN
  Administered 2023-02-10 – 2023-02-11 (×3): 5 mg via ORAL
  Filled 2023-02-10 (×3): qty 1

## 2023-02-10 MED ORDER — CLOPIDOGREL BISULFATE 75 MG PO TABS
75.0000 mg | ORAL_TABLET | Freq: Every day | ORAL | Status: DC
  Administered 2023-02-10 – 2023-02-11 (×2): 75 mg via ORAL
  Filled 2023-02-10 (×2): qty 1

## 2023-02-10 MED ORDER — ACETAMINOPHEN 325 MG PO TABS: 650.0000 mg | ORAL_TABLET | Freq: Four times a day (QID) | ORAL | Status: DC | PRN

## 2023-02-10 MED ORDER — ISOSORBIDE MONONITRATE ER 30 MG PO TB24
60.0000 mg | ORAL_TABLET | Freq: Every day | ORAL | Status: DC
  Administered 2023-02-10 – 2023-02-11 (×2): 60 mg via ORAL
  Filled 2023-02-10 (×2): qty 2

## 2023-02-10 MED ORDER — ASPIRIN 81 MG PO TBEC
81.0000 mg | DELAYED_RELEASE_TABLET | Freq: Every day | ORAL | Status: DC
  Administered 2023-02-10 – 2023-02-11 (×2): 81 mg via ORAL
  Filled 2023-02-10 (×2): qty 1

## 2023-02-10 MED ORDER — SODIUM CHLORIDE 0.9 % IV SOLN
2.0000 g | INTRAVENOUS | Status: DC
  Administered 2023-02-11: 2 g via INTRAVENOUS
  Filled 2023-02-10: qty 20

## 2023-02-10 MED ORDER — ZOLPIDEM TARTRATE 5 MG PO TABS
5.0000 mg | ORAL_TABLET | Freq: Every evening | ORAL | Status: DC | PRN
  Administered 2023-02-10: 5 mg via ORAL
  Filled 2023-02-10: qty 1

## 2023-02-10 MED ORDER — DEXTROSE 50 % IV SOLN
50.0000 mL | Freq: Once | INTRAVENOUS | Status: AC
  Administered 2023-02-10: 50 mL via INTRAVENOUS
  Filled 2023-02-10: qty 50

## 2023-02-10 MED ORDER — POTASSIUM CHLORIDE 10 MEQ/100ML IV SOLN
10.0000 meq | Freq: Once | INTRAVENOUS | Status: AC
  Administered 2023-02-10: 10 meq via INTRAVENOUS
  Filled 2023-02-10: qty 100

## 2023-02-10 MED ORDER — IOHEXOL 350 MG/ML SOLN
75.0000 mL | Freq: Once | INTRAVENOUS | Status: AC | PRN
  Administered 2023-02-10: 75 mL via INTRAVENOUS

## 2023-02-10 MED ORDER — ONDANSETRON HCL 4 MG/2ML IJ SOLN: 4.0000 mg | Freq: Four times a day (QID) | INTRAMUSCULAR | Status: DC | PRN

## 2023-02-10 MED ORDER — THIAMINE MONONITRATE 100 MG PO TABS
100.0000 mg | ORAL_TABLET | Freq: Every day | ORAL | Status: DC
  Administered 2023-02-10 – 2023-02-11 (×2): 100 mg via ORAL
  Filled 2023-02-10 (×2): qty 1

## 2023-02-10 MED ORDER — OXYCODONE-ACETAMINOPHEN 10-325 MG PO TABS: 1.0000 | ORAL_TABLET | Freq: Four times a day (QID) | ORAL | Status: DC | PRN

## 2023-02-10 MED ORDER — CHLORHEXIDINE GLUCONATE CLOTH 2 % EX PADS
6.0000 | MEDICATED_PAD | Freq: Every day | CUTANEOUS | Status: DC
  Administered 2023-02-10: 6 via TOPICAL

## 2023-02-10 MED ORDER — CALCIUM CARBONATE 1250 (500 CA) MG PO TABS
1.0000 | ORAL_TABLET | Freq: Every day | ORAL | Status: DC
  Administered 2023-02-10 – 2023-02-11 (×2): 1250 mg via ORAL
  Filled 2023-02-10 (×2): qty 1

## 2023-02-10 MED ORDER — ORAL CARE MOUTH RINSE: 15.0000 mL | OROMUCOSAL | Status: DC | PRN

## 2023-02-10 MED ORDER — LOSARTAN POTASSIUM 50 MG PO TABS
50.0000 mg | ORAL_TABLET | Freq: Every day | ORAL | Status: DC
  Administered 2023-02-10 – 2023-02-11 (×2): 50 mg via ORAL
  Filled 2023-02-10 (×2): qty 1

## 2023-02-10 MED ORDER — FOLIC ACID 1 MG PO TABS
1.0000 mg | ORAL_TABLET | Freq: Every day | ORAL | Status: DC
  Administered 2023-02-10 – 2023-02-11 (×2): 1 mg via ORAL
  Filled 2023-02-10 (×2): qty 1

## 2023-02-10 MED ORDER — DEXTROSE 10 % IV SOLN: Freq: Once | INTRAVENOUS | Status: AC

## 2023-02-10 NOTE — ED Notes (Signed)
Report given to ICU RN

## 2023-02-10 NOTE — Evaluation (Signed)
Physical Therapy Evaluation Patient Details Name: [name] MRN: [id number] DOB: June 30, 1961 Today's Date: 02/10/2023  History of Present Illness  [name] is a 62 y.o. male with medical history significant of type 2 diabetes mellitus, hypertension, CAD s/p CABG x 4 (January 2014), right hip arthritis s/p total hip arthroplasty (12/25/2022), cerebral aneurysm s/p coiling and lower extremity DVT who presents to the emergency department due to right foot pain.  Patient complained of several week onset of pain in right foot that has progressively worsened, a complaint of mild swelling at nighttime and now started to have mild erythema around his ankle.  Patient also presented with low-level low blood glucose associated with slurred speech which wife states was due to his low blood glucose level.  Patient also had sudden onset of loose bowel movement x 2 (1 at home and the other after arrival to the ED).  He states that he had 1-2 beers at home.  Wife states that patient has been short of breath since yesterday and that he could barely walk 20 feet not being short of breath.   Clinical Impression  Patient functioning near baseline for functional mobility and gait demonstrating good return for bed mobility, transfers and walking in room/hallway without requiring use of an AD and no loss of balance.  Plan:  Patient discharged from physical therapy to care of nursing for ambulation daily as tolerated for length of stay.          Assistance Recommended at Discharge PRN  If plan is discharge home, recommend the following:  Can travel by private vehicle  Other (comment) (patient near normal)        Equipment Recommendations None recommended by PT  Recommendations for Other Services       Functional Status Assessment Patient has not had a recent decline in their functional status     Precautions / Restrictions Precautions Precautions: None Restrictions Weight Bearing Restrictions: No       Mobility  Bed Mobility Overal bed mobility: Independent                  Transfers Overall transfer level: Independent                      Ambulation/Gait Ambulation/Gait assistance: Modified independent (Device/Increase time) Gait Distance (Feet): 100 Feet Assistive device: None Gait Pattern/deviations: WFL(Within Functional Limits) Gait velocity: near normal     General Gait Details: grossly WFL with good return for ambulating in room, hallways without loss of balance  Stairs            Wheelchair Mobility     Tilt Bed    Modified Rankin (Stroke Patients Only)       Balance Overall balance assessment: No apparent balance deficits (not formally assessed)                                           Pertinent Vitals/Pain Pain Assessment Pain Assessment: 0-10 Pain Score: 7  Pain Location: right foot Pain Descriptors / Indicators: Sore Pain Intervention(s): Limited activity within patient's tolerance, Monitored during session, Repositioned    Home Living Family/patient expects to be discharged to:: Private residence Living Arrangements: Spouse/significant other Available Help at Discharge: Family Type of Home: House Home Access: Stairs to enter Entrance Stairs-Rails: None Entrance Stairs-Number of Steps: 1   Home Layout: One level  Home Equipment: Cane - single Librarian, academic (2 wheels)      Prior Function Prior Level of Function : Independent/Modified Independent;Driving             Mobility Comments: Tourist information centre manager without AD, drives ADLs Comments: Independent     Hand Dominance   Dominant Hand: Right    Extremity/Trunk Assessment   Upper Extremity Assessment Upper Extremity Assessment: Defer to OT evaluation    Lower Extremity Assessment Lower Extremity Assessment: Overall WFL for tasks assessed    Cervical / Trunk Assessment Cervical / Trunk Assessment: Normal  Communication    Communication: No difficulties  Cognition Arousal/Alertness: Awake/alert Behavior During Therapy: WFL for tasks assessed/performed Overall Cognitive Status: Within Functional Limits for tasks assessed                                          General Comments      Exercises     Assessment/Plan    PT Assessment Patient does not need any further PT services  PT Problem List         PT Treatment Interventions      PT Goals (Current goals can be found in the Care Plan section)  Acute Rehab PT Goals Patient Stated Goal: return home with family to assist PT Goal Formulation: With patient Time For Goal Achievement: 02/10/23 Potential to Achieve Goals: Good    Frequency       Co-evaluation               AM-PAC PT "6 Clicks" Mobility  Outcome Measure Help needed turning from your back to your side while in a flat bed without using bedrails?: None Help needed moving from lying on your back to sitting on the side of a flat bed without using bedrails?: None Help needed moving to and from a bed to a chair (including a wheelchair)?: None Help needed standing up from a chair using your arms (e.g., wheelchair or bedside chair)?: None Help needed to walk in hospital room?: None Help needed climbing 3-5 steps with a railing? : A Little 6 Click Score: 23    End of Session   Activity Tolerance: Patient tolerated treatment well Patient left: in chair Nurse Communication: Mobility status PT Visit Diagnosis: Unsteadiness on feet (R26.81);Other abnormalities of gait and mobility (R26.89);Muscle weakness (generalized) (M62.81)    Time: 9629-5284 PT Time Calculation (min) (ACUTE ONLY): 20 min   Charges:   PT Evaluation $PT Eval Moderate Complexity: 1 Mod PT Treatments $Therapeutic Activity: 8-22 mins PT General Charges $$ ACUTE PT VISIT: 1 Visit         11:50 AM, 02/10/23 Ocie Bob, MPT Physical Therapist with [facility] [phone]  [phone] office [phone] mobile phone

## 2023-02-10 NOTE — H&P (Addendum)
History and Physical    Patient: [name] ZOX:[id number] DOB: Aug 23, 1960 DOA: 02/09/2023 DOS: the patient was seen and examined on 02/10/2023 PCP: [name], NP  Patient coming from: Home  Chief Complaint:  Chief Complaint  Patient presents with   Foot Pain   HPI: [name] is a 62 y.o. male with medical history significant of type 2 diabetes mellitus, hypertension, CAD s/p CABG x 4 (January 2014), right hip arthritis s/p total hip arthroplasty (12/25/2022), cerebral aneurysm s/p coiling and lower extremity DVT who presents to the emergency department due to right foot pain.  Patient complained of several week onset of pain in right foot that has progressively worsened, a complaint of mild swelling at nighttime and now started to have mild erythema around his ankle.  Patient also presented with low-level low blood glucose associated with slurred speech which wife states was due to his low blood glucose level.  Patient also had sudden onset of loose bowel movement x 2 (1 at home and the other after arrival to the ED).  He states that he had 1-2 beers at home.  Wife states that patient has been short of breath since yesterday and that he could barely walk 20 feet not being short of breath.  ED Course:  In the emergency department, he was tachypneic to the vital signs were within normal range.  Workup in the ED showed normal CBC except for hemoglobin of 12.8.  BMP showed sodium 129, potassium 3.1, chloride 95, bicarb 23, blood glucose 59, BUN 8, creatinine 0.68, calcium 8.4, alcohol level 140.  D-dimer 2.26.  Blood culture pending.  Urinalysis was positive for glycosuria. CT angiography chest with contrast showed no evidence of pulmonary embolus.  Groundglass airspace opacity in the lingula concerning for early pneumonia. Right ankle x-ray showed chronic appearing deformity of the right medial malleolus.  Moderate severity degenerative changes along the dorsal aspect of the proximal to mid  right foot. Patient was treated with IV ceftriaxone and doxycycline due to Community-acquired pneumonia and presumed right ankle cellulitis.  Dextrose was given due to hypoglycemia, potassium was replenished, IV hydration was provided. Hospitalist was asked to admit patient for further evaluation and management.  Review of Systems: Review of systems as noted in the HPI. All other systems reviewed and are negative.   Past Medical History:  Diagnosis Date   Arthritis    "some; in hips sometimes" (01/02/2013)   Back pain    "w/prolonged walking or standing" (01/02/2013)   CAD (coronary artery disease)    CABG X 4 08/2012   COPD (chronic obstructive pulmonary disease) (HCC)    Elevated PSA    Esophageal erosions 05/24/2001   egd by Dr. Jena Gauss   GERD (gastroesophageal reflux disease)    H/O hiatal hernia    Headache    History of gout    Hyperlipidemia    Hypertension    not on any medication for htn at present   PAD (peripheral artery disease) (HCC)    LEA DOPPLER, 11/11/2012 - moderate arterial insufficiency to lower extremities at rest, BILATERAL SFA-demonstrates occlusive disease with reconstitution of flow   S/P percutaneous transluminal angioplasty (PTA) with stent placement 03/29/21 03/31/2021   Tobacco abuse    Tubular adenoma of colon 08/28/2011   Type II diabetes mellitus (HCC)    Unintentional weight loss    Past Surgical History:  Procedure Laterality Date   ABDOMINAL AORTOGRAM W/LOWER EXTREMITY N/A 03/30/2021   Procedure: ABDOMINAL AORTOGRAM W/LOWER EXTREMITY;  Surgeon: Runell Gess, MD;  Location: [facility] INVASIVE CV LAB;  Service: Cardiovascular;  Laterality: N/A;   ABDOMINAL AORTOGRAM W/LOWER EXTREMITY N/A 04/12/2022   Procedure: ABDOMINAL AORTOGRAM W/LOWER EXTREMITY;  Surgeon: Runell Gess, MD;  Location: [facility] INVASIVE CV LAB;  Service: Cardiovascular;  Laterality: N/A;   ANTERIOR CERVICAL DECOMP/DISCECTOMY FUSION  12/19/2011   Procedure: ANTERIOR CERVICAL  DECOMPRESSION/DISCECTOMY FUSION 2 LEVELS;  Surgeon: Karn Cassis, MD;  Location: [facility] NEURO ORS;  Service: Neurosurgery;  Laterality: N/A;  Cervical four-five Cervical five-six  Anterior cervical decompression/diskectomy, fusion, plate   ATHERECTOMY N/A 01/02/2013   Procedure: ATHERECTOMY;  Surgeon: Runell Gess, MD;  Location: [facility] CATH LAB;  Service: Cardiovascular;  Laterality: N/A;   BACK SURGERY     x3   CARDIAC CATHETERIZATION  08/19/2012   CABG warranted; Severe ostial LCx stenosis with mid to distal vessel occlusion   COLONOSCOPY W/ POLYPECTOMY  08/28/2011   Rourk-tubular adenomas removed from ascending colon, suboptimal prep, diminutive rectal polyps   COLONOSCOPY WITH PROPOFOL N/A 06/13/2017   Procedure: COLONOSCOPY WITH PROPOFOL;  Surgeon: Corbin Ade, MD;  Location: [facility] ENDO SUITE;  Service: Endoscopy;  Laterality: N/A;  9:15am   CORONARY ARTERY BYPASS GRAFT  08/22/2012   Procedure: CORONARY ARTERY BYPASS GRAFTING (CABG);  Surgeon: Delight Ovens, MD;  Location: [facility] OR;  Service: Open Heart Surgery;  Laterality: N/A;  CABG x four,  using left internal mammary artery and right leg greater saphenous vein harvested endoscopically   ESOPHAGOGASTRODUODENOSCOPY  05/24/2001   by Dr. Jena Gauss   ESOPHAGOGASTRODUODENOSCOPY  08/28/2011   Rourk-erosive reflux esophagitis, gastric erosions   ESOPHAGOGASTRODUODENOSCOPY (EGD) WITH PROPOFOL N/A 06/13/2017   Procedure: ESOPHAGOGASTRODUODENOSCOPY (EGD) WITH PROPOFOL;  Surgeon: Corbin Ade, MD;  Location: [facility] ENDO SUITE;  Service: Endoscopy;  Laterality: N/A;   INTRAOPERATIVE TRANSESOPHAGEAL ECHOCARDIOGRAM  08/22/2012   Procedure: INTRAOPERATIVE TRANSESOPHAGEAL ECHOCARDIOGRAM;  Surgeon: Delight Ovens, MD;  Location: [facility] OR;  Service: Open Heart Surgery;  Laterality: N/A;   IR 3D INDEPENDENT WKST  05/17/2022   IR ANGIO INTRA EXTRACRAN SEL INTERNAL CAROTID UNI R MOD SED  05/17/2022   IR ANGIO INTRA EXTRACRAN SEL INTERNAL CAROTID UNI R MOD  SED  12/04/2022   IR ANGIOGRAM FOLLOW UP STUDY  05/17/2022   IR NEURO EACH ADD'L AFTER BASIC UNI RIGHT (MS)  05/17/2022   IR TRANSCATH/EMBOLIZ  05/17/2022   IR US GUIDE VASC ACCESS RIGHT  05/17/2022   IR US GUIDE VASC ACCESS RIGHT  12/04/2022   LEFT HEART CATHETERIZATION WITH CORONARY ANGIOGRAM N/A 08/19/2012   Procedure: LEFT HEART CATHETERIZATION WITH CORONARY ANGIOGRAM;  Surgeon: Chrystie Nose, MD;  Location: [facility] CATH LAB;  Service: Cardiovascular;  Laterality: N/A;   LOWER EXTREMITY ANGIOGRAM N/A 11/17/2012   Procedure: LOWER EXTREMITY ANGIOGRAM;  Surgeon: Runell Gess, MD;  Location: [facility] CATH LAB;  Service: Cardiovascular;  Laterality: N/A;   LOWER EXTREMITY ANGIOGRAM N/A 02/02/2013   Procedure: LOWER EXTREMITY ANGIOGRAM;  Surgeon: Runell Gess, MD;  Location: [facility] CATH LAB;  Service: Cardiovascular;  Laterality: N/A;   LUMBAR DISC SURGERY     LUMBAR FUSION  ~ 2011   PELVIC ABCESS DRAINAGE     x2   PERIPHERAL VASCULAR ATHERECTOMY  03/30/2021   Procedure: PERIPHERAL VASCULAR ATHERECTOMY;  Surgeon: Runell Gess, MD;  Location: [facility] INVASIVE CV LAB;  Service: Cardiovascular;;  left common iliac artery   PERIPHERAL VASCULAR BALLOON ANGIOPLASTY Left 04/12/2022   Procedure: PERIPHERAL VASCULAR BALLOON ANGIOPLASTY;  Surgeon: Runell Gess,  MD;  Location: [facility] INVASIVE CV LAB;  Service: Cardiovascular;  Laterality: Left;  SFA   PERIPHERAL VASCULAR INTERVENTION  03/30/2021   Procedure: PERIPHERAL VASCULAR INTERVENTION;  Surgeon: Runell Gess, MD;  Location: [facility] INVASIVE CV LAB;  Service: Cardiovascular;;   POLYPECTOMY  06/13/2017   Procedure: POLYPECTOMY;  Surgeon: Corbin Ade, MD;  Location: [facility] ENDO SUITE;  Service: Endoscopy;;  colon   RADIOLOGY WITH ANESTHESIA N/A 05/17/2022   Procedure: Diagnostic Cerebral Angiogram, Possible Aneurysm Coiling, Possible Stent;  Surgeon: Lisbeth Renshaw, MD;  Location: [facility] OR;  Service: Radiology;  Laterality: N/A;   SHOULDER SURGERY  Right    TOTAL HIP ARTHROPLASTY Right 12/25/2022   Procedure: TOTAL HIP ARTHROPLASTY;  Surgeon: Teryl Lucy, MD;  Location: [facility] ORS;  Service: Orthopedics;  Laterality: Right;   TOTAL HIP ARTHROPLASTY Right    VASCULAR SURGERY Right 01/02/2013   diamondback orbital rotational  atherectomy, chocolate balloon and IDEV  Stent.    Social History:  reports that he has been smoking cigarettes. He has a 58.50 pack-year smoking history. He has never used smokeless tobacco. He reports current alcohol use of about 12.0 standard drinks of alcohol per week. He reports that he does not use drugs.   No Known Allergies  Family History  Problem Relation Age of Onset   Anesthesia problems Neg Hx    Colon cancer Neg Hx    Gastric cancer Neg Hx    Esophageal cancer Neg Hx    Colon polyps Neg Hx      Prior to Admission medications   Medication Sig Start Date End Date Taking? Authorizing Provider  acetaminophen (TYLENOL) 650 MG CR tablet Take 650 mg by mouth every 8 (eight) hours as needed for pain.    [provider]  aspirin EC 81 MG EC tablet Take 1 tablet (81 mg total) by mouth daily. 09/11/18   Catarina Hartshorn, MD  Blood Glucose Monitoring Suppl (ONETOUCH VERIO FLEX SYSTEM) w/Device KIT  11/21/20   [provider]  carboxymethylcellulose (REFRESH PLUS) 0.5 % SOLN Place 1 drop into both eyes 3 (three) times daily as needed (dry eyes).    [provider]  clopidogrel (PLAVIX) 75 MG tablet Take 1 tablet (75 mg total) by mouth daily with breakfast. 05/03/15   Hilty, Lisette Abu, MD  cyclobenzaprine (FLEXERIL) 10 MG tablet Take 1 tablet (10 mg total) by mouth 3 (three) times daily as needed for muscle spasms. 10/31/17   Tressie Stalker, MD  empagliflozin (JARDIANCE) 25 MG TABS tablet Take 25 mg by mouth daily.    [provider]  gabapentin (NEURONTIN) 300 MG capsule Take 300 mg by mouth 3 (three) times daily. 08/19/18   [provider]  GLIMEPIRIDE PO Take 1 tablet by  mouth in the morning and at bedtime.    [provider]  isosorbide mononitrate (IMDUR) 30 MG 24 hr tablet Take 1 tablet (30 mg total) by mouth daily. 04/25/22   Runell Gess, MD  Lancets [facility] DELICA PLUS [place]) MISC Apply topically daily. 02/07/21   [provider]  losartan (COZAAR) 50 MG tablet Take 50 mg by mouth daily. 04/27/22   [provider]  Loteprednol Etabonate (EYSUVIS) 0.25 % SUSP Place 1 drop into both eyes at bedtime as needed (Dry Eye).    [provider]  metoprolol succinate (TOPROL-XL) 25 MG 24 hr tablet TAKE 1 TABLET BY MOUTH ONCE A DAY. Patient taking differently: Take 25 mg by mouth daily. 09/26/22   Zoila Shutter  C, MD  Na Sulfate-K Sulfate-Mg Sulf 17.5-3.13-1.6 GM/177ML SOLN As directed 09/19/22   Rourk, Gerrit Friends, MD  ondansetron (ZOFRAN) 4 MG tablet Take 1 tablet (4 mg total) by mouth every 8 (eight) hours as needed for nausea or vomiting. 12/25/22   Armida Sans, PA-C  [facility] VERIO test strip 2 (two) times daily. 11/21/20   [provider]  oxyCODONE-acetaminophen (PERCOCET) 10-325 MG tablet Take 1 tablet by mouth 4 (four) times daily as needed for pain. 02/20/21   [provider]  PROAIR HFA 108 (90 BASE) MCG/ACT inhaler Inhale 2 puffs into the lungs every 6 (six) hours as needed for wheezing or shortness of breath.  08/14/13   [provider]  rosuvastatin (CRESTOR) 40 MG tablet Take 1 tablet (40 mg total) by mouth daily. 09/10/18   Catarina Hartshorn, MD  zolpidem (AMBIEN CR) 12.5 MG CR tablet Take 12.5 mg by mouth at bedtime. 06/15/20   [provider]    Physical Exam: BP (!) 110/58   Pulse 67   Temp 97.8 F (36.6 C) (Oral)   Resp (!) 23   Ht 5\' 11"  (1.803 m)   Wt 77.1 kg   SpO2 93%   BMI 23.71 kg/m   General: 62 y.o. year-old male ill appearing, but in no acute distress.  Alert and oriented x3. HEENT: NCAT, EOMI Neck: Supple, trachea medial Cardiovascular: Regular rate and rhythm  with no rubs or gallops.  No thyromegaly or JVD noted.  2/4 pulses in all 4 extremities. Respiratory: Tachypnea.  Clear to auscultation with no wheezes or rales. Good inspiratory effort. Abdomen: Soft, nontender nondistended with normal bowel sounds x4 quadrants. Muskuloskeletal: Tender to palpation of right ankle, mild erythema without warmth to touch.   Neuro: CN II-XII intact, focal neurologic deficit.  Sensation, reflexes intact Skin: No ulcerative lesions noted or rashes Psychiatry: Judgement and insight appear normal. Mood is appropriate for condition and setting          Labs on Admission:  Basic Metabolic Panel: Recent Labs  Lab 02/09/23 2341  NA 129*  K 3.1*  CL 95*  CO2 23  GLUCOSE 59*  BUN 8  CREATININE 0.68  CALCIUM 8.4*   Liver Function Tests: Recent Labs  Lab 02/09/23 2341  AST 14*  ALT 15  ALKPHOS 68  BILITOT 0.3  PROT 7.0  ALBUMIN 3.7   No results for input(s): "LIPASE", "AMYLASE" in the last 168 hours. No results for input(s): "AMMONIA" in the last 168 hours. CBC: Recent Labs  Lab 02/09/23 2341  WBC 9.4  NEUTROABS 6.4  HGB 12.8*  HCT 39.8  MCV 91.9  PLT 150   Cardiac Enzymes: No results for input(s): "CKTOTAL", "CKMB", "CKMBINDEX", "TROPONINI" in the last 168 hours.  BNP (last 3 results) No results for input(s): "BNP" in the last 8760 hours.  ProBNP (last 3 results) No results for input(s): "PROBNP" in the last 8760 hours.  CBG: Recent Labs  Lab 02/09/23 2339 02/10/23 0046 02/10/23 0236  GLUCAP 64* 81 97    Radiological Exams on Admission: CT Angio Chest PE W and/or Wo Contrast  Result Date: 02/10/2023 CLINICAL DATA:  Insert history EXAM: CT ANGIOGRAPHY CHEST WITH CONTRAST TECHNIQUE: Multidetector CT imaging of the chest was performed using the standard protocol during bolus administration of intravenous contrast. Multiplanar CT image reconstructions and MIPs were obtained to evaluate the vascular anatomy. RADIATION DOSE REDUCTION:  This exam was performed according to the departmental dose-optimization program which includes automated exposure control, adjustment  of the mA and/or kV according to patient size and/or use of iterative reconstruction technique. CONTRAST:  75mL OMNIPAQUE IOHEXOL 350 MG/ML SOLN COMPARISON:  08/15/2022 FINDINGS: Cardiovascular: No filling defects in the pulmonary arteries to suggest pulmonary emboli. Heart is normal size. Aorta is normal caliber. Prior CABG. Aortic atherosclerosis. Mediastinum/Nodes: No mediastinal, hilar, or axillary adenopathy. Trachea and esophagus are unremarkable. Thyroid unremarkable. Lungs/Pleura: Ground-glass airspace opacity in the lingula. No additional confluent airspace opacity or effusion. Upper Abdomen: No acute findings Musculoskeletal: Chest wall soft tissues are unremarkable. No acute bony abnormality. Review of the MIP images confirms the above findings. IMPRESSION: No evidence of pulmonary embolus. Ground-glass airspace opacity in the lingula concerning for early pneumonia. Coronary artery disease, prior CABG. Aortic Atherosclerosis (ICD10-I70.0). Electronically Signed   By: Charlett Nose M.D.   On: 02/10/2023 01:37   DG Ankle 2 Views Right  Result Date: 02/09/2023 CLINICAL DATA:  Status post trauma. EXAM: RIGHT ANKLE - 2 VIEW COMPARISON:  None Available. FINDINGS: There is no evidence of an acute fracture, dislocation, or joint effusion. In ill-defined, chronic appearing deformity is seen involving the right medial malleolus. There are moderate severity degenerative changes seen along the dorsal aspect of the proximal to mid right foot. Soft tissues are unremarkable. IMPRESSION: 1. Chronic appearing deformity of the right medial malleolus. 2. Moderate severity degenerative changes along the dorsal aspect of the proximal to mid right foot. Electronically Signed   By: Aram Candela M.D.   On: 02/09/2023 23:59    EKG: I independently viewed the EKG done and my findings are as  followed: Normal sinus rhythm at a rate of 78 bpm  Assessment/Plan Present on Admission:  CAP (community acquired pneumonia)  Essential hypertension  Mixed hyperlipidemia  Principal Problem:   CAP (community acquired pneumonia) Active Problems:   Essential hypertension   Mixed hyperlipidemia   Acute respiratory failure with hypoxia (HCC)   Cellulitis of right ankle   Type 2 diabetes mellitus with hypoglycemia (HCC)   Hypokalemia   Hypocalcemia   Elevated d-dimer   Coronary artery disease   Acute diarrhea  CAP POA CT angiography of chest was suggestive of early pneumonia Patient was started on ceftriaxone and doxycycline, we shall continue same at this time with plan to de-escalate/discontinue based on blood culture, sputum culture, urine Legionella, strep pneumo and procalcitonin Continue Tylenol as needed Continue Mucinex, incentive spirometry, flutter valve   Acute respiratory failure with hypoxia possibly secondary to above Continue supplemental oxygen via [place] to maintain O2 sat > 92% with plan to wean patient off this as tolerated.  Patient does not use oxygen at baseline)  Presumed right ankle cellulitis Continue IV doxycycline  Chronic right ankle pain Continue Tylenol as needed Continue Percocet and Flexeril per home regimen Continue PT/OT eval and treat  Acute diarrhea C. difficile pending  Hypokalemia K+ 3.1, this will be replenished  Type 2 diabetes mellitus with hypoglycemia Hemoglobin A1c on 5/9 was 7.2 Hypoglycemia may be due to glimepiride effect, this will be held at this time Continue to monitor CBG and continue with hypoglycemic protocol Consider starting sliding scale insulin when blood glucose becomes elevated  Hypocalcemia Corrected Calcium level based on albumin was 8.6 Continue Os-Cal  Elevated alcohol level Alcohol level was 140.  No sign of any alcohol withdrawal at this time Continue to monitor patient for withdrawal symptoms and  consider starting patient on CIWA protocol Patient denies habitual alcohol consumption  Elevated D-dimer CT angiography was negative for pulmonary embolism  CAD post CABG Continue on aspirin, clopidogrel, Crestor, Imdur  Essential hypertension Continue losartan, Imdur  Mixed hyperlipidemia Continue Crestor  DVT prophylaxis: Lovenox   Advance Care Planning: Full code  Consults: None  Family Communication: Wife at bedside  Severity of Illness: The appropriate patient status for this patient is INPATIENT. Inpatient status is judged to be reasonable and necessary in order to provide the required intensity of service to ensure the patient's safety. The patient's presenting symptoms, physical exam findings, and initial radiographic and laboratory data in the context of their chronic comorbidities is felt to place them at high risk for further clinical deterioration. Furthermore, it is not anticipated that the patient will be medically stable for discharge from the hospital within 2 midnights of admission.   * I certify that at the point of admission it is my clinical judgment that the patient will require inpatient hospital care spanning beyond 2 midnights from the point of admission due to high intensity of service, high risk for further deterioration and high frequency of surveillance required.*  Author: Frankey Shown, DO 02/10/2023 5:13 AM  For on call review [email or web address].

## 2023-02-10 NOTE — Progress Notes (Signed)
Patient seen and examined; admitted after midnight secondary to shortness of breath, coughing spells and generalized fatigue.  Workup demonstrating the presence of community-acquired pneumonia and concerns for right ankle cellulitis.  Hemodynamically stable at the moment and demonstrating good oxygen saturation on 2 L supplementation.  Please refer to H&P written by Dr. Thomes Dinning on 02/10/2023 for further info/details on admission.  Plan: -Start thiamine and folic acid -Follow CBG fluctuation -Continue current antibiotics -Started nebulizer management, adjust steroids and follow clinical response.  Vassie Loll MD [phone]

## 2023-02-10 NOTE — Progress Notes (Signed)
Patient stated he "already had two incentive spirometers at home." I requested he ask family to bring him one when they came to visit and he agreed.

## 2023-02-10 NOTE — ED Notes (Signed)
ED TO INPATIENT HANDOFF REPORT  ED Nurse Name and Phone #:  Neldon Mc RN [phone]  S Name/Age/Gender [name] 62 y.o. male Room/Bed: APA05/APA05  Code Status   Code Status: Full Code  Home/SNF/Other Home Patient oriented to: self, place, time, and situation Is this baseline? Yes   Triage Complete: Triage complete  Chief Complaint CAP (community acquired pneumonia) [J18.9]  Triage Note Pt c/o right foot pain x 2 weeks and describes it as a throbbing pain like a toothache  Upon arrival to ED room pt is slurry words and lethargic with an episode of incontinence of diarrhea; pt's SO states pt has been having episodes of diarrhea all day  Pt admits to drinking to 2 beers today  Pt assisted to bed and cleaned up with new sheets and gown   Allergies No Known Allergies  Level of Care/Admitting Diagnosis ED Disposition     ED Disposition  Admit   Condition  --   Comment  Hospital Area: [facility] [[id number]]  Level of Care: Stepdown [14]  Covid Evaluation: Asymptomatic - no recent exposure (last 10 days) testing not required  Diagnosis: CAP (community acquired pneumonia) [102725]  Admitting Physician: Frankey Shown [[id number]]  Attending Physician: Frankey Shown [[id number]]  Certification:: I certify this patient will need inpatient services for at least 2 midnights  Estimated Length of Stay: 3          B Medical/Surgery History Past Medical History:  Diagnosis Date   Arthritis    "some; in hips sometimes" (01/02/2013)   Back pain    "w/prolonged walking or standing" (01/02/2013)   CAD (coronary artery disease)    CABG X 4 08/2012   COPD (chronic obstructive pulmonary disease) (HCC)    Elevated PSA    Esophageal erosions 05/24/2001   egd by Dr. Jena Gauss   GERD (gastroesophageal reflux disease)    H/O hiatal hernia    Headache    History of gout    Hyperlipidemia    Hypertension    not on any medication for htn at present   PAD  (peripheral artery disease) (HCC)    LEA DOPPLER, 11/11/2012 - moderate arterial insufficiency to lower extremities at rest, BILATERAL SFA-demonstrates occlusive disease with reconstitution of flow   S/P percutaneous transluminal angioplasty (PTA) with stent placement 03/29/21 03/31/2021   Tobacco abuse    Tubular adenoma of colon 08/28/2011   Type II diabetes mellitus (HCC)    Unintentional weight loss    Past Surgical History:  Procedure Laterality Date   ABDOMINAL AORTOGRAM W/LOWER EXTREMITY N/A 03/30/2021   Procedure: ABDOMINAL AORTOGRAM W/LOWER EXTREMITY;  Surgeon: Runell Gess, MD;  [facility];  Service: Cardiovascular;  Laterality: N/A;   ABDOMINAL AORTOGRAM W/LOWER EXTREMITY N/A 04/12/2022   Procedure: ABDOMINAL AORTOGRAM W/LOWER EXTREMITY;  Surgeon: Runell Gess, MD;  Location: [facility]AS[facility] CV LAB;  Service: Cardiovascular;  Laterality: N/A;   ANTERIOR CERVICAL DECOMP/DISCECTOMY FUSION  12/19/2011   Procedure: ANTERIOR CERVICAL DECOMPRESSION/DISCECTOMY FUSION 2 LEVELS;  Surgeon: Karn Cassis, MD;  Location: [facility] NEURO ORS;  Service: Neurosurgery;  Laterality: N/A;  Cervical four-five Cervical five-six  Anterior cervical decompression/diskectomy, fusion, plate   ATHERECTOMY N/A 01/02/2013   Procedure: ATHERECTOMY;  Surgeon: Runell Gess, MD;  Location: [facility] CATH LAB;  Service: Cardiovascular;  Laterality: N/A;   BACK SURGERY     x3   CARDIAC CATHETERIZATION  08/19/2012   CABG warranted; Severe ostial LCx stenosis with mid to distal vessel occlusion  COLONOSCOPY W/ POLYPECTOMY  08/28/2011   Rourk-tubular adenomas removed from ascending colon, suboptimal prep, diminutive rectal polyps   COLONOSCOPY WITH PROPOFOL N/A 06/13/2017   Procedure: COLONOSCOPY WITH PROPOFOL;  Surgeon: Corbin Ade, MD;  [facility]: [facility];  Service: Endoscopy;  Laterality: N/A;  9:15am   CORONARY ARTERY BYPASS GRAFT  08/22/2012   Procedure: CORONARY ARTERY BYPASS GRAFTING  (CABG);  Surgeon: Delight Ovens, MD;  Location: [facility] OR;  Service: Open Heart Surgery;  Laterality: N/A;  CABG x four,  using left internal mammary artery and right leg greater saphenous vein harvested endoscopically   ESOPHAGOGASTRODUODENOSCOPY  05/24/2001   by Dr. Jena Gauss   ESOPHAGOGASTRODUODENOSCOPY  08/28/2011   Rourk-erosive reflux esophagitis, gastric erosions   ESOPHAGOGASTRODUODENOSCOPY (EGD) WITH PROPOFOL N/A 06/13/2017   Procedure: ESOPHAGOGASTRODUODENOSCOPY (EGD) WITH PROPOFOL;  Surgeon: Corbin Ade, MD;  [facility];  Service: Endoscopy;  Laterality: N/A;   INTRAOPERATIVE TRANSESOPHAGEAL ECHOCARDIOGRAM  08/22/2012   Procedure: INTRAOPERATIVE TRANSESOPHAGEAL ECHOCARDIOGRAM;  Surgeon: Delight Ovens, MD;  Location: [facility] OR;  Service: Open Heart Surgery;  Laterality: N/A;   IR 3D INDEPENDENT WKST  05/17/2022   IR ANGIO INTRA EXTRACRAN SEL INTERNAL CAROTID UNI R MOD SED  05/17/2022   IR ANGIO INTRA EXTRACRAN SEL INTERNAL CAROTID UNI R MOD SED  12/04/2022   IR ANGIOGRAM FOLLOW UP STUDY  05/17/2022   IR NEURO EACH ADD'L AFTER BASIC UNI RIGHT (MS)  05/17/2022   IR TRANSCATH/EMBOLIZ  05/17/2022   IR US GUIDE VASC ACCESS RIGHT  05/17/2022   IR US GUIDE VASC ACCESS RIGHT  12/04/2022   LEFT HEART CATHETERIZATION WITH CORONARY ANGIOGRAM N/A 08/19/2012   Procedure: LEFT HEART CATHETERIZATION WITH CORONARY ANGIOGRAM;  Surgeon: Chrystie Nose, MD;  [facility] CATH LAB;  Service: Cardiovascular;  Laterality: N/A;   LOWER EXTREMITY ANGIOGRAM N/A 11/17/2012   Procedure: LOWER EXTREMITY ANGIOGRAM;  Surgeon: Runell Gess, MD;  Location: [facility] CATH LAB;  Service: Cardiovascular;  Laterality: N/A;   LOWER EXTREMITY ANGIOGRAM N/A 02/02/2013   Procedure: LOWER EXTREMITY ANGIOGRAM;  Surgeon: Runell Gess, MD;  Location: [facility] CATH LAB;  Service: Cardiovascular;  Laterality: N/A;   LUMBAR DISC SURGERY     LUMBAR FUSION  ~ 2011   PELVIC ABCESS DRAINAGE     x2   PERIPHERAL VASCULAR  ATHERECTOMY  03/30/2021   Procedure: PERIPHERAL VASCULAR ATHERECTOMY;  Surgeon: Runell Gess, MD;  Location: [facility] INVASIVE CV LAB;  Service: Cardiovascular;;  left common iliac artery   PERIPHERAL VASCULAR BALLOON ANGIOPLASTY Left 04/12/2022   Procedure: PERIPHERAL VASCULAR BALLOON ANGIOPLASTY;  Surgeon: Runell Gess, MD;  [facility];  Service: Cardiovascular;  Laterality: Left;  SFA   PERIPHERAL VASCULAR INTERVENTION  03/30/2021   Procedure: PERIPHERAL VASCULAR INTERVENTION;  Surgeon: Runell Gess, MD;  [facility]: [facility];  Service: Cardiovascular;;   POLYPECTOMY  06/13/2017   Procedure: POLYPECTOMY;  Surgeon: Corbin Ade, MD;  [facility];  Service: Endoscopy;;  colon   RADIOLOGY WITH ANESTHESIA N/A 05/17/2022   Procedure: Diagnostic Cerebral Angiogram, Possible Aneurysm Coiling, Possible Stent;  Surgeon: Lisbeth Renshaw, MD;  Location: [facility] OR;  Service: Radiology;  Laterality: N/A;   SHOULDER SURGERY Right    TOTAL HIP ARTHROPLASTY Right 12/25/2022   Procedure: TOTAL HIP ARTHROPLASTY;  Surgeon: Teryl Lucy, MD;  Location: [facility] ORS;  Service: Orthopedics;  Laterality: Right;   TOTAL HIP ARTHROPLASTY Right    VASCULAR SURGERY Right 01/02/2013   diamondback orbital  rotational  atherectomy, chocolate balloon and IDEV  Stent.     A IV Location/Drains/Wounds Patient Lines/Drains/Airways Status     Active Line/Drains/Airways     Name Placement date Placement time Site Days   Peripheral IV 02/09/23 20 G Anterior;Right Forearm 02/09/23  2337  Forearm  1   Wound / Incision (Open or Dehisced) 05/17/22 Puncture Groin Anterior;Proximal;Right Arterial access puncture site 05/17/22  1800  Groin  269            Intake/Output Last 24 hours  Intake/Output Summary (Last 24 hours) at 02/10/2023 0513 Last data filed at 02/10/2023 0224 Gross per 24 hour  Intake 1100 ml  Output --  Net 1100 ml    Labs/Imaging Results for orders placed  or performed during the hospital encounter of 02/09/23 (from the past 48 hour(s))  CBG monitoring, ED     Status: Abnormal   Collection Time: 02/09/23 11:39 PM  Result Value Ref Range   Glucose-Capillary 64 (L) 70 - 99 mg/dL    Comment: Glucose reference range applies only to samples taken after fasting for at least 8 hours.  CBC with Differential     Status: Abnormal   Collection Time: 02/09/23 11:41 PM  Result Value Ref Range   WBC 9.4 4.0 - 10.5 K/uL   RBC 4.33 4.22 - 5.81 MIL/uL   Hemoglobin 12.8 (L) 13.0 - 17.0 g/dL   HCT 16.1 09.6 - 04.5 %   MCV 91.9 80.0 - 100.0 fL   MCH 29.6 26.0 - 34.0 pg   MCHC 32.2 30.0 - 36.0 g/dL   RDW 40.9 81.1 - 91.4 %   Platelets 150 150 - 400 K/uL   nRBC 0.0 0.0 - 0.2 %   Neutrophils Relative % 67 %   Neutro Abs 6.4 1.7 - 7.7 K/uL   Lymphocytes Relative 20 %   Lymphs Abs 1.9 0.7 - 4.0 K/uL   Monocytes Relative 9 %   Monocytes Absolute 0.9 0.1 - 1.0 K/uL   Eosinophils Relative 3 %   Eosinophils Absolute 0.2 0.0 - 0.5 K/uL   Basophils Relative 1 %   Basophils Absolute 0.1 0.0 - 0.1 K/uL   Immature Granulocytes 0 %   Abs Immature Granulocytes 0.02 0.00 - 0.07 K/uL    Comment: Performed at [facility], [address] [place], [place] [address]  Comprehensive metabolic panel     Status: Abnormal   Collection Time: 02/09/23 11:41 PM  Result Value Ref Range   Sodium 129 (L) 135 - 145 mmol/L   Potassium 3.1 (L) 3.5 - 5.1 mmol/L   Chloride 95 (L) 98 - 111 mmol/L   CO2 23 22 - 32 mmol/L   Glucose, Bld 59 (L) 70 - 99 mg/dL    Comment: Glucose reference range applies only to samples taken after fasting for at least 8 hours.   BUN 8 8 - 23 mg/dL   Creatinine, Ser 6.21 0.61 - 1.24 mg/dL   Calcium 8.4 (L) 8.9 - 10.3 mg/dL   Total Protein 7.0 6.5 - 8.1 g/dL   Albumin 3.7 3.5 - 5.0 g/dL   AST 14 (L) 15 - 41 U/L   ALT 15 0 - 44 U/L   Alkaline Phosphatase 68 38 - 126 U/L   Total Bilirubin 0.3 0.3 - 1.2 mg/dL   GFR, Estimated >30 >86 mL/min     Comment: (NOTE) Calculated using the CKD-EPI Creatinine Equation (2021)    Anion gap 11 5 - 15    Comment: Performed  at [facility], [address], [place], [place] [address]  D-dimer, quantitative     Status: Abnormal   Collection Time: 02/09/23 11:41 PM  Result Value Ref Range   D-Dimer, Quant 2.26 (H) 0.00 - 0.50 ug/mL-FEU    Comment: (NOTE) At the manufacturer cut-off value of 0.5 g/mL FEU, this assay has a negative predictive value of 95-100%.This assay is intended for use in conjunction with a clinical pretest probability (PTP) assessment model to exclude pulmonary embolism (PE) and deep venous thrombosis (DVT) in outpatients suspected of PE or DVT. Results should be correlated with clinical presentation. Performed at [facility], [address], [place], [place] [address]   Ethanol     Status: Abnormal   Collection Time: 02/10/23 12:42 AM  Result Value Ref Range   Alcohol, Ethyl (B) 140 (H) <10 mg/dL    Comment: (NOTE) Lowest detectable limit for serum alcohol is 10 mg/dL.  For medical purposes only. Performed at [facility], [address], [place], [place] [address]   POC CBG, ED     Status: None   Collection Time: 02/10/23 12:46 AM  Result Value Ref Range   Glucose-Capillary 81 70 - 99 mg/dL    Comment: Glucose reference range applies only to samples taken after fasting for at least 8 hours.   Comment 1 Notify RN    Comment 2 Document in Chart   CBG monitoring, ED     Status: None   Collection Time: 02/10/23  2:36 AM  Result Value Ref Range   Glucose-Capillary 97 70 - 99 mg/dL    Comment: Glucose reference range applies only to samples taken after fasting for at least 8 hours.  Blood culture (routine x 2)     Status: None (Preliminary result)   Collection Time: 02/10/23  3:04 AM   Specimen: Right Antecubital; Blood  Result Value Ref Range   Specimen Description RIGHT ANTECUBITAL    Special Requests      BOTTLES DRAWN AEROBIC AND ANAEROBIC Blood Culture  adequate volume Performed at [facility], [address] [place], [place] [address]    Culture PENDING    Report Status PENDING   Blood culture (routine x 2)     Status: None (Preliminary result)   Collection Time: 02/10/23  3:10 AM   Specimen: Blood  Result Value Ref Range   Specimen Description LEFT ANTECUBITAL    Special Requests      BOTTLES DRAWN AEROBIC AND ANAEROBIC Blood Culture adequate volume Performed at [facility], [address] [place], [place] [address]    Culture PENDING    Report Status PENDING   Urinalysis, Routine w reflex microscopic -Urine, Clean Catch     Status: Abnormal   Collection Time: 02/10/23  4:30 AM  Result Value Ref Range   Color, Urine COLORLESS (A) YELLOW   APPearance CLEAR CLEAR   Specific Gravity, Urine 1.002 (L) 1.005 - 1.030   pH 6.0 5.0 - 8.0   Glucose, UA >=500 (A) NEGATIVE mg/dL   Hgb urine dipstick NEGATIVE NEGATIVE   Bilirubin Urine NEGATIVE NEGATIVE   Ketones, ur NEGATIVE NEGATIVE mg/dL   Protein, ur NEGATIVE NEGATIVE mg/dL   Nitrite NEGATIVE NEGATIVE   Leukocytes,Ua NEGATIVE NEGATIVE   RBC / HPF 0-5 0 - 5 RBC/hpf   WBC, UA 0-5 0 - 5 WBC/hpf   Bacteria, UA NONE SEEN NONE SEEN   Squamous Epithelial / HPF 0-5 0 - 5 /HPF    Comment: Performed at [facility], [address] [place], [place] [address]   CT Angio Chest PE W and/or Wo Contrast  Result Date: 02/10/2023 CLINICAL DATA:  Insert history EXAM: CT ANGIOGRAPHY CHEST WITH CONTRAST TECHNIQUE: Multidetector CT imaging of the chest was performed using the standard protocol during bolus administration of intravenous contrast. Multiplanar CT image reconstructions and MIPs were obtained to evaluate the vascular anatomy. RADIATION DOSE REDUCTION: This exam was performed according to the departmental dose-optimization program which includes automated exposure control, adjustment of the mA and/or kV according to patient size and/or use of iterative reconstruction technique. CONTRAST:  75mL  OMNIPAQUE IOHEXOL 350 MG/ML SOLN COMPARISON:  08/15/2022 FINDINGS: Cardiovascular: No filling defects in the pulmonary arteries to suggest pulmonary emboli. Heart is normal size. Aorta is normal caliber. Prior CABG. Aortic atherosclerosis. Mediastinum/Nodes: No mediastinal, hilar, or axillary adenopathy. Trachea and esophagus are unremarkable. Thyroid unremarkable. Lungs/Pleura: Ground-glass airspace opacity in the lingula. No additional confluent airspace opacity or effusion. Upper Abdomen: No acute findings Musculoskeletal: Chest wall soft tissues are unremarkable. No acute bony abnormality. Review of the MIP images confirms the above findings. IMPRESSION: No evidence of pulmonary embolus. Ground-glass airspace opacity in the lingula concerning for early pneumonia. Coronary artery disease, prior CABG. Aortic Atherosclerosis (ICD10-I70.0). Electronically Signed   By: Charlett Nose M.D.   On: 02/10/2023 01:37   DG Ankle 2 Views Right  Result Date: 02/09/2023 CLINICAL DATA:  Status post trauma. EXAM: RIGHT ANKLE - 2 VIEW COMPARISON:  None Available. FINDINGS: There is no evidence of an acute fracture, dislocation, or joint effusion. In ill-defined, chronic appearing deformity is seen involving the right medial malleolus. There are moderate severity degenerative changes seen along the dorsal aspect of the proximal to mid right foot. Soft tissues are unremarkable. IMPRESSION: 1. Chronic appearing deformity of the right medial malleolus. 2. Moderate severity degenerative changes along the dorsal aspect of the proximal to mid right foot. Electronically Signed   By: Aram Candela M.D.   On: 02/09/2023 23:59    Pending Labs Unresulted Labs (From admission, onward)     Start     Ordered   02/17/23 0500  Creatinine, serum  (enoxaparin (LOVENOX)    CrCl >/= 30 ml/min)  Weekly,   R     Comments: while on enoxaparin therapy    02/10/23 0434   02/10/23 0500  Comprehensive metabolic panel  Tomorrow morning,   R         02/10/23 0434   02/10/23 0500  Magnesium  Tomorrow morning,   R        02/10/23 0434   02/10/23 0500  Phosphorus  Tomorrow morning,   R        02/10/23 0434   02/10/23 0500  Procalcitonin  Tomorrow morning,   R       References:    Procalcitonin Lower Respiratory Tract Infection AND Sepsis Procalcitonin Algorithm   02/10/23 0439   02/10/23 0433  CBC  (enoxaparin (LOVENOX)    CrCl >/= 30 ml/min)  Once,   R       Comments: Baseline for enoxaparin therapy IF NOT ALREADY DRAWN.  Notify MD if PLT < 100 K.    02/10/23 0434   02/10/23 0431  HIV Antibody (routine testing w rflx)  (HIV Antibody (Routine testing w reflex) panel)  Once,   R        02/10/23 0434   02/10/23 0431  Expectorated Sputum Assessment w Gram Stain, Rflx to Resp Cult  (COPD / Pneumonia / Cellulitis / Lower  Extremity Wound)  Once,   R        02/10/23 0434   02/10/23 0431  Legionella Pneumophila Serogp 1 Ur Ag  (COPD / Pneumonia / Cellulitis / Lower Extremity Wound)  Once,   R        02/10/23 0434   02/10/23 0431  Strep pneumoniae urinary antigen  (COPD / Pneumonia / Cellulitis / Lower Extremity Wound)  Once,   R        02/10/23 0434   02/10/23 0244  C Difficile Quick Screen w PCR reflex  (C Difficile quick screen w PCR reflex panel )  Once, for 24 hours,   URGENT       References:    CDiff Information Tool   02/10/23 0243   02/10/23 0244  Gastrointestinal Panel by PCR , Stool  (Gastrointestinal Panel by PCR, Stool                                                                                                                                                     **Does Not include CLOSTRIDIUM DIFFICILE testing. **If CDIFF testing is needed, place order from the "C Difficile Testing" order set.**)  Once,   URGENT        02/10/23 0243            Vitals/Pain Today's Vitals   02/09/23 2334 02/10/23 0000 02/10/23 0030 02/10/23 0048  BP:  124/64 (!) 110/58   Pulse:  70 67   Resp:  (!) 23 (!) 23   Temp:      TempSrc:       SpO2:  95% 93%   Weight: 77.1 kg     Height: 5\' 11"  (1.803 m)     PainSc:    Asleep    Isolation Precautions Enteric precautions (UV disinfection)  Medications Medications  cefTRIAXone (ROCEPHIN) 2 g in sodium chloride 0.9 % 100 mL IVPB (has no administration in time range)  enoxaparin (LOVENOX) injection 40 mg (has no administration in time range)  acetaminophen (TYLENOL) tablet 650 mg (has no administration in time range)    Or  acetaminophen (TYLENOL) suppository 650 mg (has no administration in time range)  ondansetron (ZOFRAN) tablet 4 mg (has no administration in time range)    Or  ondansetron (ZOFRAN) injection 4 mg (has no administration in time range)  doxycycline (VIBRAMYCIN) 100 mg in sodium chloride 0.9 % 250 mL IVPB (has no administration in time range)  dextromethorphan-guaiFENesin (MUCINEX DM) 30-600 MG per 12 hr tablet 1 tablet (has no administration in time range)  potassium chloride 10 mEq in 100 mL IVPB (has no administration in time range)  calcium carbonate (OS-CAL - dosed in mg of elemental calcium) tablet 1,250 mg (has no administration in time range)  lactated ringers bolus 1,000 mL (0 mLs Intravenous Stopped 02/10/23 0048)  dextrose 50 % solution 50 mL (50 mLs Intravenous Not Given 02/10/23 0017)  potassium chloride 10 mEq in 100 mL IVPB (0 mEq Intravenous Stopped 02/10/23 0224)  dextrose 50 % solution 50 mL (50 mLs Intravenous Given 02/10/23 0106)  iohexol (OMNIPAQUE) 350 MG/ML injection 75 mL (75 mLs Intravenous Contrast Given 02/10/23 0118)  doxycycline (VIBRA-TABS) tablet 100 mg (100 mg Oral Given 02/10/23 0224)  dextrose 10 % infusion ( Intravenous New Bag/Given 02/10/23 0311)  cefTRIAXone (ROCEPHIN) 2 g in sodium chloride 0.9 % 100 mL IVPB (2 g Intravenous New Bag/Given 02/10/23 1610)    Mobility walks with person assist     Focused Assessments    R Recommendations: See Admitting Provider Note  Report given to:   Additional Notes:

## 2023-02-11 DIAGNOSIS — K219 Gastro-esophageal reflux disease without esophagitis: Secondary | ICD-10-CM

## 2023-02-11 DIAGNOSIS — I2510 Atherosclerotic heart disease of native coronary artery without angina pectoris: Secondary | ICD-10-CM | POA: Diagnosis not present

## 2023-02-11 DIAGNOSIS — R7989 Other specified abnormal findings of blood chemistry: Secondary | ICD-10-CM | POA: Diagnosis not present

## 2023-02-11 DIAGNOSIS — J189 Pneumonia, unspecified organism: Secondary | ICD-10-CM | POA: Diagnosis not present

## 2023-02-11 DIAGNOSIS — E876 Hypokalemia: Secondary | ICD-10-CM | POA: Diagnosis not present

## 2023-02-11 LAB — CBC
HCT: 44.2 % (ref 39.0–52.0)
Hemoglobin: 13.9 g/dL (ref 13.0–17.0)
MCH: 29.6 pg (ref 26.0–34.0)
MCHC: 31.4 g/dL (ref 30.0–36.0)
MCV: 94.2 fL (ref 80.0–100.0)
Platelets: 153 10*3/uL (ref 150–400)
RBC: 4.69 MIL/uL (ref 4.22–5.81)
RDW: 13.9 % (ref 11.5–15.5)
WBC: 8.1 10*3/uL (ref 4.0–10.5)
nRBC: 0 % (ref 0.0–0.2)

## 2023-02-11 LAB — BASIC METABOLIC PANEL
Anion gap: 10 (ref 5–15)
BUN: 12 mg/dL (ref 8–23)
CO2: 26 mmol/L (ref 22–32)
Calcium: 9.2 mg/dL (ref 8.9–10.3)
Chloride: 101 mmol/L (ref 98–111)
Creatinine, Ser: 0.83 mg/dL (ref 0.61–1.24)
GFR, Estimated: >60 mL/min (ref >60)
Glucose, Bld: 145 mg/dL — ABNORMAL HIGH (ref 70–99)
Potassium: 4.2 mmol/L (ref 3.5–5.1)
Sodium: 137 mmol/L (ref 135–145)

## 2023-02-11 LAB — GLUCOSE, CAPILLARY
Glucose-Capillary: 126 mg/dL — ABNORMAL HIGH (ref 70–99)
Glucose-Capillary: 161 mg/dL — ABNORMAL HIGH (ref 70–99)
Glucose-Capillary: 205 mg/dL — ABNORMAL HIGH (ref 70–99)
Glucose-Capillary: 209 mg/dL — ABNORMAL HIGH (ref 70–99)

## 2023-02-11 LAB — LEGIONELLA PNEUMOPHILA SEROGP 1 UR AG: L. pneumophila Serogp 1 Ur Ag: NEGATIVE

## 2023-02-11 LAB — STREP PNEUMONIAE URINARY ANTIGEN: Strep Pneumo Urinary Antigen: NEGATIVE

## 2023-02-11 MED ORDER — CYCLOBENZAPRINE HCL 10 MG PO TABS: 10.0000 mg | ORAL_TABLET | Freq: Three times a day (TID) | ORAL | Status: DC | PRN

## 2023-02-11 MED ORDER — FOLIC ACID 1 MG PO TABS: 1.0000 mg | ORAL_TABLET | Freq: Every day | ORAL | 1 refills | Status: DC

## 2023-02-11 MED ORDER — VITAMIN B-1 100 MG PO TABS: 100.0000 mg | ORAL_TABLET | Freq: Every day | ORAL | 2 refills | Status: AC

## 2023-02-11 MED ORDER — ISOSORBIDE MONONITRATE ER 60 MG PO TB24: 60.0000 mg | ORAL_TABLET | Freq: Every day | ORAL | 2 refills | Status: AC

## 2023-02-11 MED ORDER — AMOXICILLIN-POT CLAVULANATE 875-125 MG PO TABS: 1.0000 | ORAL_TABLET | Freq: Two times a day (BID) | ORAL | 0 refills | Status: AC

## 2023-02-11 MED ORDER — DM-GUAIFENESIN ER 30-600 MG PO TB12: 1.0000 | ORAL_TABLET | Freq: Two times a day (BID) | ORAL | 0 refills | Status: AC

## 2023-02-11 NOTE — Progress Notes (Signed)
OT Cancellation Note  Patient Details Name: [name] MRN: [id number] DOB: 05-Apr-1961   Cancelled Treatment:    Reason Eval/Treat Not Completed: OT screened, no needs identified, will sign off. Discussed pt with evaluating physical therapist. Pt reportedly did well and is not in need of OT services. Pt will be removed from the OT treatment list. Thank you.   Siri Buege OT, MOT   Danie Chandler 02/11/2023, 7:52 AM

## 2023-02-11 NOTE — Discharge Summary (Signed)
Physician Discharge Summary   Patient: [name] MRN: [id number] DOB: [birth year]  Admit date:     02/09/2023  Discharge date: 02/11/23  Discharge Physician: Vassie Loll   PCP: [name], NP   Recommendations at discharge:  Repeat basic metabolic panel to follow electrolytes renal function Repeat chest x-ray in 6-8 weeks to assure resolution of infiltrate Reassess blood sugar/A1c with further adjustment to hypoglycemia regimen  Discharge Diagnoses: Principal Problem:   CAP (community acquired pneumonia) Active Problems:   Essential hypertension   Mixed hyperlipidemia   Acute respiratory failure with hypoxia (HCC)   Cellulitis of right ankle   Type 2 diabetes mellitus with hypoglycemia (HCC)   Hypokalemia   Hypocalcemia   Elevated d-dimer   Coronary artery disease   Acute diarrhea   Gastroesophageal reflux disease  Brief Hospital admission narrative: As per H&P written by Dr. Thomes Dinning on 02/10/2019 [name] is a 62 y.o. male with medical history significant of type 2 diabetes mellitus, hypertension, CAD s/p CABG x 4 (January 2014), right hip arthritis s/p total hip arthroplasty (12/25/2022), cerebral aneurysm s/p coiling and lower extremity DVT who presents to the emergency department due to right foot pain.  Patient complained of several week onset of pain in right foot that has progressively worsened, a complaint of mild swelling at nighttime and now started to have mild erythema around his ankle.  Patient also presented with low-level low blood glucose associated with slurred speech which wife states was due to his low blood glucose level.  Patient also had sudden onset of loose bowel movement x 2 (1 at home and the other after arrival to the ED).  He states that he had 1-2 beers at home.  Wife states that patient has been short of breath since yesterday and that he could barely walk 20 feet without being short of breath.   In the emergency department, he was tachypneic  to the vital signs were within normal range.  Workup in the ED showed normal CBC except for hemoglobin of 12.8.  BMP showed sodium 129, potassium 3.1, chloride 95, bicarb 23, blood glucose 59, BUN 8, creatinine 0.68, calcium 8.4, alcohol level 140.  D-dimer 2.26.  Blood culture pending.  Urinalysis was positive for glycosuria. CT angiography chest with contrast showed no evidence of pulmonary embolus.  Groundglass airspace opacity in the lingula concerning for early pneumonia. Right ankle x-ray showed chronic appearing deformity of the right medial malleolus.  Moderate severity degenerative changes along the dorsal aspect of the proximal to mid right foot. Patient was treated with IV ceftriaxone and doxycycline due to Community-acquired pneumonia and presumed right ankle cellulitis.  Dextrose was given due to hypoglycemia, potassium was replenished, IV hydration was provided. Hospitalist was asked to admit patient for further evaluation and management.  Assessment and Plan: 1-acute respiratory failure with hypoxia secondary to community-acquired pneumonia -Excellent response to antibiotic therapy and supportive care -At time of discharge no oxygen supplementation was required -Patient has been sent home on oral antibiotics to complete management and instruction to follow-up with PCP in 10 days. -Continue the use of mucolytic's and incentive spirometer. -Blood cultures and sputum culture without any growth at time of discharge.  2-acute isolated episode of diarrhea -Resolved after hospitalization -No further events of loose stools noticed -C. difficile and GI panel after discussing with infection prevention department were discontinued. -Patient advised to maintain adequate hydration -Most likely viral gastroenteritis.  3-hypokalemia -In the setting of GI losses and decreased oral intake -  Electrolytes have been repleted and within normal limits at discharge -Repeat basic metabolic panel  follow-up visit to assess electrolytes trend/stability.  4-hypocalcemia -Corrected calcium level on albumin was 8.6 -Continue daily Os-Cal supplementation -Continue to follow electrolytes trend -Patient advised to maintain adequate nutritional/hydration.  5-elevated alcohol level -Patient reported having some beer intake prior to admission -Report that he does not drink much or get drunk that this was an isolated event -Cessation counseling provided -No withdrawal symptoms appreciated -Thiamine and folic acid prescribed at discharge.  6-elevated D-dimer -CT angiography negative for pulmonary embolism -Patient denies any chest pain or shortness of breath at time of discharge  7-mixed hyperlipidemia -Continue statin  8-history of coronary artery disease status post CABG -Continue treatment with aspirin, Plavix, Crestor, Imdur and metoprolol Continue patient follow-up with cardiology service. -Chest pain reported at discharge.  9-essential hypertension -Resume home antihypertensive regimen -Heart healthy/low-sodium diet discussed with patient.  10-type 2 diabetes -Resume home hypoglycemic regimen -Continue to follow-up with PCP as an outpatient to further adjust diabetes management as required.  11-resewn right lower leg cellulitis -Patient reporting significant intermittent episodes of swelling and pain especially in his left ankle and at times associated neuropathy -After keeping leg elevated there was no concerns of swelling or pain there may be a component of venous insufficiency -Cellulitis was ruled out.  Consultants: None Procedures performed: See below for x-ray reports. Disposition: Home Diet recommendation: Heart healthy/low-sodium and modified carbohydrates diet recommended.  DISCHARGE MEDICATION: Allergies as of 02/11/2023   No Known Allergies      Medication List     STOP taking these medications    HumaLOG Mix 75/25 KwikPen (75-25) 100 UNIT/ML  Kwikpen Generic drug: Insulin Lispro Prot & Lispro   HYDROmorphone 2 MG tablet Commonly known as: Dilaudid   Lantus SoloStar 100 UNIT/ML Solostar Pen Generic drug: insulin glargine   OneTouch Delica Plus Lancet33G Misc       TAKE these medications    acetaminophen 650 MG CR tablet Commonly known as: TYLENOL Take 1,300 mg by mouth daily as needed for pain.   albuterol 108 (90 Base) MCG/ACT inhaler Commonly known as: VENTOLIN HFA Inhale 2 puffs into the lungs every 6 (six) hours as needed for wheezing or shortness of breath.   amoxicillin-clavulanate 875-125 MG tablet Commonly known as: AUGMENTIN Take 1 tablet by mouth 2 (two) times daily for 7 days.   aspirin EC 81 MG tablet Take 1 tablet (81 mg total) by mouth daily.   clopidogrel 75 MG tablet Commonly known as: PLAVIX Take 1 tablet (75 mg total) by mouth daily with breakfast.   cyclobenzaprine 10 MG tablet Commonly known as: FLEXERIL Take 1 tablet (10 mg total) by mouth every 8 (eight) hours as needed for muscle spasms. What changed: when to take this   dextromethorphan-guaiFENesin 30-600 MG 12hr tablet Commonly known as: MUCINEX DM Take 1 tablet by mouth 2 (two) times daily for 10 days.   empagliflozin 25 MG Tabs tablet Commonly known as: JARDIANCE Take 25 mg by mouth daily.   folic acid 1 MG tablet Commonly known as: FOLVITE Take 1 tablet (1 mg total) by mouth daily. Start taking on: February 12, 2023   gabapentin 600 MG tablet Commonly known as: NEURONTIN Take 600 mg by mouth 3 (three) times daily.   glipiZIDE 10 MG 24 hr tablet Commonly known as: GLUCOTROL XL Take 10 mg by mouth 2 (two) times daily.   icosapent Ethyl 1 g capsule Commonly known as: VASCEPA Take 2  g by mouth 2 (two) times daily.   isosorbide mononitrate 60 MG 24 hr tablet Commonly known as: IMDUR Take 1 tablet (60 mg total) by mouth daily. Start taking on: February 12, 2023 What changed:  medication strength how much to take    losartan 50 MG tablet Commonly known as: COZAAR Take 50 mg by mouth daily.   metoprolol succinate 25 MG 24 hr tablet Commonly known as: TOPROL-XL TAKE 1 TABLET BY MOUTH ONCE A DAY.   ondansetron 4 MG tablet Commonly known as: Zofran Take 1 tablet (4 mg total) by mouth every 8 (eight) hours as needed for nausea or vomiting.   oxyCODONE-acetaminophen 10-325 MG tablet Commonly known as: PERCOCET Take 1 tablet by mouth 4 (four) times daily as needed for pain.   rosuvastatin 40 MG tablet Commonly known as: CRESTOR Take 1 tablet (40 mg total) by mouth daily.   thiamine 100 MG tablet Commonly known as: Vitamin B-1 Take 1 tablet (100 mg total) by mouth daily. Start taking on: February 12, 2023   VITAMIN D-3 PO Take 1 capsule by mouth daily.   zolpidem 12.5 MG CR tablet Commonly known as: AMBIEN CR Take 12.5 mg by mouth at bedtime.        Follow-up Information     [name], NP. Schedule an appointment as soon as possible for a visit in 10 day(s).   Specialty: Adult Health Nurse Practitioner Contact information: [address] [place] [address] [phone]                Discharge Exam: Ceasar Mons Weights   02/09/23 2334 02/10/23 0700 02/11/23 0521  Weight: 77.1 kg 85.6 kg 85 kg   General exam: Alert, awake, oriented x 3 Respiratory system: Clear to auscultation. Respiratory effort normal. Cardiovascular system:RRR. No murmurs, rubs, gallops. Gastrointestinal system: Abdomen is nondistended, soft and nontender. No organomegaly or masses felt. Normal bowel sounds heard. Central nervous system: Alert and oriented. No focal neurological deficits. Extremities: No C/C/E, +pedal pulses Skin: No rashes, lesions or ulcers Psychiatry: Judgement and insight appear normal. Mood & affect appropriate.    Condition at discharge: Stable and improved.  The results of significant diagnostics from this hospitalization (including imaging, microbiology,  ancillary and laboratory) are listed below for reference.   Imaging Studies: CT Angio Chest PE W and/or Wo Contrast  Result Date: 02/10/2023 CLINICAL DATA:  Insert history EXAM: CT ANGIOGRAPHY CHEST WITH CONTRAST TECHNIQUE: Multidetector CT imaging of the chest was performed using the standard protocol during bolus administration of intravenous contrast. Multiplanar CT image reconstructions and MIPs were obtained to evaluate the vascular anatomy. RADIATION DOSE REDUCTION: This exam was performed according to the departmental dose-optimization program which includes automated exposure control, adjustment of the mA and/or kV according to patient size and/or use of iterative reconstruction technique. CONTRAST:  75mL OMNIPAQUE IOHEXOL 350 MG/ML SOLN COMPARISON:  08/15/2022 FINDINGS: Cardiovascular: No filling defects in the pulmonary arteries to suggest pulmonary emboli. Heart is normal size. Aorta is normal caliber. Prior CABG. Aortic atherosclerosis. Mediastinum/Nodes: No mediastinal, hilar, or axillary adenopathy. Trachea and esophagus are unremarkable. Thyroid unremarkable. Lungs/Pleura: Ground-glass airspace opacity in the lingula. No additional confluent airspace opacity or effusion. Upper Abdomen: No acute findings Musculoskeletal: Chest wall soft tissues are unremarkable. No acute bony abnormality. Review of the MIP images confirms the above findings. IMPRESSION: No evidence of pulmonary embolus. Ground-glass airspace opacity in the lingula concerning for early pneumonia. Coronary artery disease, prior CABG. Aortic Atherosclerosis (ICD10-I70.0).  Electronically Signed   By: Charlett Nose M.D.   On: 02/10/2023 01:37   DG Ankle 2 Views Right  Result Date: 02/09/2023 CLINICAL DATA:  Status post trauma. EXAM: RIGHT ANKLE - 2 VIEW COMPARISON:  None Available. FINDINGS: There is no evidence of an acute fracture, dislocation, or joint effusion. In ill-defined, chronic appearing deformity is seen involving the  right medial malleolus. There are moderate severity degenerative changes seen along the dorsal aspect of the proximal to mid right foot. Soft tissues are unremarkable. IMPRESSION: 1. Chronic appearing deformity of the right medial malleolus. 2. Moderate severity degenerative changes along the dorsal aspect of the proximal to mid right foot. Electronically Signed   By: Aram Candela M.D.   On: 02/09/2023 23:59   VAS [place] LOWER EXTREMITY VENOUS (DVT)  Result Date: 01/24/2023  Lower Venous DVT Study Patient Name:  GABRIELL LACHNEY  Date of Exam:   01/24/2023 Medical Rec #: [id number]      Accession #:    [id number] Date of Birth: 04-30-1961      Patient Gender: M Patient Age:   55 years Exam Location:  [facility] Procedure:      VAS [place] LOWER EXTREMITY VENOUS (DVT) Referring Phys: Teryl Lucy --------------------------------------------------------------------------------  Indications: Acute onset of swelling and pain in the right calf and foot. Patient denies any SOB.  Risk Factors: Surgery to the right hip; total right hip arthroplasty on 12/25/2022. Comparison Study: NA Performing Technologist: Tyna Jaksch RVT  Examination Guidelines: A complete evaluation includes B-mode imaging, spectral Doppler, color Doppler, and power Doppler as needed of all accessible portions of each vessel. Bilateral testing is considered an integral part of a complete examination. Limited examinations for reoccurring indications may be performed as noted. The reflux portion of the exam is performed with the patient in reverse Trendelenburg.  +---------+---------------+---------+-----------+----------+--------------+ RIGHT    CompressibilityPhasicitySpontaneityPropertiesThrombus Aging +---------+---------------+---------+-----------+----------+--------------+ CFV      Full           Yes      Yes                                 +---------+---------------+---------+-----------+----------+--------------+ SFJ      Full            Yes      Yes                                 +---------+---------------+---------+-----------+----------+--------------+ FV Prox  Full           Yes      Yes                                 +---------+---------------+---------+-----------+----------+--------------+ FV Mid   Full                                                        +---------+---------------+---------+-----------+----------+--------------+ FV DistalFull           Yes      Yes                                 +---------+---------------+---------+-----------+----------+--------------+  PFV      Full                    Yes                                 +---------+---------------+---------+-----------+----------+--------------+ POP      Full           Yes      Yes                                 +---------+---------------+---------+-----------+----------+--------------+ PTV      Full           Yes      Yes                                 +---------+---------------+---------+-----------+----------+--------------+ PERO     Full           Yes      Yes                                 +---------+---------------+---------+-----------+----------+--------------+ Gastroc  Full                                                        +---------+---------------+---------+-----------+----------+--------------+ GSV      Full           Yes      Yes                                 +---------+---------------+---------+-----------+----------+--------------+   +----+---------------+---------+-----------+----------+--------------+ LEFTCompressibilityPhasicitySpontaneityPropertiesThrombus Aging +----+---------------+---------+-----------+----------+--------------+ CFV Full           Yes      Yes                                 +----+---------------+---------+-----------+----------+--------------+     Summary: RIGHT: - No evidence of deep vein thrombosis in the lower extremity. No indirect  evidence of obstruction proximal to the inguinal ligament. - No cystic structure found in the popliteal fossa.  LEFT: - No evidence of common femoral vein obstruction.  *See table(s) above for measurements and observations. Electronically signed by Sherald Hess MD on 01/24/2023 at 3:46:32 PM.    Final     Microbiology: Results for orders placed or performed during the hospital encounter of 02/09/23  Blood culture (routine x 2)     Status: None (Preliminary result)   Collection Time: 02/10/23  3:04 AM   Specimen: Right Antecubital; Blood  Result Value Ref Range Status   Specimen Description RIGHT ANTECUBITAL  Final   Special Requests   Final    BOTTLES DRAWN AEROBIC AND ANAEROBIC Blood Culture adequate volume   Culture   Final    NO GROWTH 1 DAY Performed at [facility], [address], [place], [place] [address]    Report Status PENDING  Incomplete  Blood culture (routine x 2)     Status: None (Preliminary result)  Collection Time: 02/10/23  3:10 AM   Specimen: Left Antecubital; Blood  Result Value Ref Range Status   Specimen Description LEFT ANTECUBITAL  Final   Special Requests   Final    BOTTLES DRAWN AEROBIC AND ANAEROBIC Blood Culture adequate volume   Culture   Final    NO GROWTH 1 DAY Performed at [facility], [address] [place], [place] [address]    Report Status PENDING  Incomplete  MRSA Next Gen by PCR, Nasal     Status: None   Collection Time: 02/10/23  5:30 AM  Result Value Ref Range Status   MRSA by PCR Next Gen NOT DETECTED NOT DETECTED Final    Comment: (NOTE) The GeneXpert MRSA Assay (FDA approved for NASAL specimens only), is one component of a comprehensive MRSA colonization surveillance program. It is not intended to diagnose MRSA infection nor to guide or monitor treatment for MRSA infections. Test performance is not FDA approved in patients less than 74 years old. Performed at [facility], [address], [place], [place] [address]      Labs: CBC: Recent Labs  Lab 02/09/23 2341 02/11/23 0508  WBC 9.4 8.1  NEUTROABS 6.4  --   HGB 12.8* 13.9  HCT 39.8 44.2  MCV 91.9 94.2  PLT 150 153   Basic Metabolic Panel: Recent Labs  Lab 02/09/23 2341 02/10/23 0609 02/11/23 0508  NA 129*  --  137  K 3.1*  --  4.2  CL 95*  --  101  CO2 23  --  26  GLUCOSE 59*  --  145*  BUN 8  --  12  CREATININE 0.68  --  0.83  CALCIUM 8.4*  --  9.2  MG  --  2.2  --   PHOS  --  4.9*  --    Liver Function Tests: Recent Labs  Lab 02/09/23 2341  AST 14*  ALT 15  ALKPHOS 68  BILITOT 0.3  PROT 7.0  ALBUMIN 3.7   CBG: Recent Labs  Lab 02/10/23 2013 02/11/23 0027 02/11/23 0445 02/11/23 0738 02/11/23 1121  GLUCAP 199* 126* 205* 161* 209*    Discharge time spent: greater than 30 minutes.  Signed: Vassie Loll, MD [facility] 02/11/2023

## 2023-02-15 LAB — CULTURE, BLOOD (ROUTINE X 2)
Culture: NO GROWTH
Culture: NO GROWTH
Special Requests: ADEQUATE
Special Requests: ADEQUATE

## 2023-02-22 ENCOUNTER — Other Ambulatory Visit (HOSPITAL_COMMUNITY): Payer: Self-pay | Admitting: Adult Health Nurse Practitioner

## 2023-02-22 ENCOUNTER — Telehealth: Payer: Self-pay | Admitting: Cardiovascular Disease

## 2023-02-22 ENCOUNTER — Ambulatory Visit (HOSPITAL_COMMUNITY)
Admission: RE | Admit: 2023-02-22 | Discharge: 2023-02-22 | Disposition: A | Discharge status: Home / Self Care | Payer: 59 | Source: Ambulatory Visit | Attending: Adult Health Nurse Practitioner | Admitting: Adult Health Nurse Practitioner

## 2023-02-22 DIAGNOSIS — J189 Pneumonia, unspecified organism: Secondary | ICD-10-CM | POA: Diagnosis not present

## 2023-02-22 DIAGNOSIS — E782 Mixed hyperlipidemia: Secondary | ICD-10-CM | POA: Diagnosis not present

## 2023-02-22 DIAGNOSIS — E876 Hypokalemia: Secondary | ICD-10-CM | POA: Diagnosis not present

## 2023-02-22 DIAGNOSIS — M79671 Pain in right foot: Secondary | ICD-10-CM | POA: Diagnosis not present

## 2023-02-22 DIAGNOSIS — J9601 Acute respiratory failure with hypoxia: Secondary | ICD-10-CM | POA: Diagnosis not present

## 2023-02-22 DIAGNOSIS — Z951 Presence of aortocoronary bypass graft: Secondary | ICD-10-CM | POA: Diagnosis not present

## 2023-02-22 DIAGNOSIS — E1165 Type 2 diabetes mellitus with hyperglycemia: Secondary | ICD-10-CM | POA: Diagnosis not present

## 2023-02-22 NOTE — Telephone Encounter (Signed)
[facility] PCP, [name], NP, is calling to schedule sooner appt due to change in top right foot. She states change is significant and there is no pulse. Requesting patient be called if there is a sooner available appt with Dr. Allyson Sabal available.

## 2023-02-22 NOTE — Telephone Encounter (Signed)
Toni Amend would like for you to review patient's recent ED visit 7/6 and xray's of his foot. She states he was diagnosed with cellulitis but disagrees. She states he has no feeling in his foot and no blood flow. She states this is something you are already aware of the issue. She wanted to know if consult can be sooner to do his PV angio. Are you able to see him sooner than 7/31

## 2023-02-27 DIAGNOSIS — M87051 Idiopathic aseptic necrosis of right femur: Secondary | ICD-10-CM | POA: Diagnosis not present

## 2023-03-06 ENCOUNTER — Ambulatory Visit: Payer: 59 | Attending: Cardiovascular Disease | Admitting: Cardiovascular Disease

## 2023-03-06 ENCOUNTER — Encounter: Payer: Self-pay | Admitting: Cardiovascular Disease

## 2023-03-06 VITALS — BP 124/68 | HR 88 | Ht 71.0 in | Wt 187.2 lb

## 2023-03-06 DIAGNOSIS — I739 Peripheral vascular disease, unspecified: Secondary | ICD-10-CM | POA: Diagnosis not present

## 2023-03-06 DIAGNOSIS — Z01818 Encounter for other preprocedural examination: Secondary | ICD-10-CM | POA: Diagnosis not present

## 2023-03-06 LAB — BASIC METABOLIC PANEL

## 2023-03-06 LAB — CBC
Hematocrit: 46.3 % (ref 37.5–51.0)
Hemoglobin: 15.1 g/dL (ref 13.0–17.7)
MCH: 28.9 pg (ref 26.6–33.0)
MCHC: 32.6 g/dL (ref 31.5–35.7)
MCV: 89 fL (ref 79–97)
Platelets: 177 10*3/uL (ref 150–450)
RBC: 5.22 x10E6/uL (ref 4.14–5.80)
RDW: 13.6 % (ref 11.6–15.4)
WBC: 8.8 10*3/uL (ref 3.4–10.8)

## 2023-03-06 NOTE — H&P (View-Only) (Signed)
 03/06/2023 [name]   [birth year]  [id number]  Primary Physician [name], NP Primary Cardiologist: Runell Gess MD Nicholes Calamity, [place]  HPI:  [name] is a 62 y.o.    mildly overweight single, though lives with his girlfriend [name], Caucasian male father of one child seen for peripheral vascular disease because of claudication and diminished ABIs.  I last saw him in the office 11/20/2022.  Risk factors include continued tobacco abuse of one pack -2 pks per day, diabetes and hyperlipidemia. He was catheterized by Dr. Marinus Maw 08/19/2012 demonstrated three-vessel disease. He ultimately underwent coronary artery bypass grafting x4 January 17 for LIMA to his LAD, a vein to the diagonal branch, ramus intermedius branch and to the RCA. He tolerated the procedure well. His EF was 50-55% by 2-D echo. Dopplers revealed ABIs of 0.5 and 0.7. He complaind of left greater than right claudication at one block. He underwent percutaneous revascularization using the Viance CTO catheter, diamondback orbital rotational atherectomy, chocolate balloon and IDEV Stent successfully to the mid Rt SFA. He enjoyed an excellent angiographic and clinical result with improvement Dopplers. He underwent an attempt at percutaneous revascularization of his long segment calcified left SFA CTO for lifestyle limiting claudication.I was unable to cross the lesion with a Viance CTO catheter.the patient presents in followup. Continues to have left lower extremity claudication. Also continues to smoke. We discussed options including retrograde attempt at history catheterization by Dr. Hoy Finlay in [place] versus femoropopliteal bypass grafting. Since she's had coronary bypass grafting, I suspect he does not have adequate venous conduits left to perform lower shimmy surgical room randomization and what was thought to require PTFE.  I did send him to [facility] where Dr. Hoy Finlay been 6 hours revascularizing a  left SFA CTO successfully.  He is continue to smoke a pack a day.  Over the last several months he has noticed increasing left greater than right lower extremity lifestyle of any claudication with recent Doppler studies performed 03/16/2021 revealing a right ABI of 0.80 and a left of 0.74.  He does have a high-frequency signal in his left SFA.  He wishes to proceed with outpatient endovascular therapy.   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  He underwent orbital atherectomy followed by VBX covered stenting (8 mm x 29 mm).  He was discharged home the following day.  His Dopplers performed 04/03/2021 revealed a widely patent stent.  His claudication resolved after that time.  He does unfortunately continue to smoke.   He has had recurrent claudication symptoms beginning of this year.  He had Dopplers performed 02/20/2022 revealing right ABI of 0.73 and a left ABI of 0.61.  He does have a high-frequency signal in his proximal left SFA with patent SFA stents bilaterally.  He wishes to proceed with outpatient peripheral angiography.   I performed angiography and intervention on him 04/12/2022 revealing patent left common iliac artery stent, patent bilateral SFA stents and a subtotally occluded calcified proximal left left SFA stenosis which I intervened on using chocolate balloon angioplasty followed by DCB.  His claudication had resolved and his Dopplers have markedly improved.  He does continue to smoke unfortunately.   Since I saw him 3 months ago I had intended perform right lower extremity intervention however this was delayed because of a intracranial coiling procedure.  He ultimately underwent right total hip replacement with Dr.  Landau in had pneumonia as well.  He returns now because of right calf and foot pain and wishes to proceed with outpatient peripheral angiography and endovascular therapy.  He does  continue to smoke a pack and a half a day.   Current Meds  Medication Sig   acetaminophen (TYLENOL) 650 MG CR tablet Take 1,300 mg by mouth daily as needed for pain.   albuterol (VENTOLIN HFA) 108 (90 Base) MCG/ACT inhaler Inhale 2 puffs into the lungs every 6 (six) hours as needed for wheezing or shortness of breath.   aspirin EC 81 MG EC tablet Take 1 tablet (81 mg total) by mouth daily.   Cholecalciferol (VITAMIN D-3 PO) Take 1 capsule by mouth daily.   clopidogrel (PLAVIX) 75 MG tablet Take 1 tablet (75 mg total) by mouth daily with breakfast.   cyclobenzaprine (FLEXERIL) 10 MG tablet Take 1 tablet (10 mg total) by mouth every 8 (eight) hours as needed for muscle spasms.   empagliflozin (JARDIANCE) 25 MG TABS tablet Take 25 mg by mouth daily.   folic acid (FOLVITE) 1 MG tablet Take 1 tablet (1 mg total) by mouth daily.   gabapentin (NEURONTIN) 600 MG tablet Take 600 mg by mouth 3 (three) times daily.   glipiZIDE (GLUCOTROL XL) 10 MG 24 hr tablet Take 10 mg by mouth 2 (two) times daily.   icosapent Ethyl (VASCEPA) 1 g capsule Take 2 g by mouth 2 (two) times daily.   isosorbide mononitrate (IMDUR) 60 MG 24 hr tablet Take 1 tablet (60 mg total) by mouth daily.   losartan (COZAAR) 50 MG tablet Take 50 mg by mouth daily.   metoprolol succinate (TOPROL-XL) 25 MG 24 hr tablet TAKE 1 TABLET BY MOUTH ONCE A DAY.   ondansetron (ZOFRAN) 4 MG tablet Take 1 tablet (4 mg total) by mouth every 8 (eight) hours as needed for nausea or vomiting.   oxyCODONE-acetaminophen (PERCOCET) 10-325 MG tablet Take 1 tablet by mouth 4 (four) times daily as needed for pain.   rosuvastatin (CRESTOR) 40 MG tablet Take 1 tablet (40 mg total) by mouth daily.   thiamine (VITAMIN B-1) 100 MG tablet Take 1 tablet (100 mg total) by mouth daily.   zolpidem (AMBIEN CR) 12.5 MG CR tablet Take 12.5 mg by mouth at bedtime.     No Known Allergies  Social History   Socioeconomic History   Marital status: Single    Spouse  name: Not on file   Number of children: 1   Years of education: Not on file   Highest education level: Not on file  Occupational History   Occupation: disabled    Employer: DISABLED  Tobacco Use   Smoking status: Every Day    Current packs/day: 1.50    Average packs/day: 1.5 packs/day for 39.0 years (58.5 ttl pk-yrs)    Types: Cigarettes   Smokeless tobacco: Never  Vaping Use   Vaping status: Never Used  Substance and Sexual Activity   Alcohol use: Yes    Alcohol/week: 12.0 standard drinks of alcohol    Types: 12 Cans of beer per week    Comment: weekly   Drug use: No   Sexual activity: Not Currently  Other Topics Concern   Not on file  Social History Narrative   Lives w/ girlfriend   Social Determinants of Health   Financial Resource Strain: Low Risk  (08/15/2022)   Received from [facility], [facility]   Overall Financial Resource Strain (CARDIA)    Difficulty of  Paying Living Expenses: Not very hard  Food Insecurity: No Food Insecurity (02/10/2023)   Hunger Vital Sign    Worried About Running Out of Food in the Last Year: Never true    Ran Out of Food in the Last Year: Never true  Transportation Needs: No Transportation Needs (02/10/2023)   PRAPARE - Administrator, Civil Service (Medical): No    Lack of Transportation (Non-Medical): No  Physical Activity: Unknown (08/15/2022)   Received from [facility], [facility]   Exercise Vital Sign    Days of Exercise per Week: 0 days    Minutes of Exercise per Session: Not on file  Stress: No Stress Concern Present (08/15/2022)   Received from [facility], [facility] of Occupational Health - Occupational Stress Questionnaire    Feeling of Stress : Not at all  Social Connections: Moderately Integrated (08/15/2022)   Received from [facility], [facility]   Social Network    How would you rate your social network (family, work, friends)?: Adequate participation with social networks   Intimate Partner Violence: Not At Risk (02/10/2023)   Humiliation, Afraid, Rape, and Kick questionnaire    Fear of Current or Ex-Partner: No    Emotionally Abused: No    Physically Abused: No    Sexually Abused: No     Review of Systems: General: negative for chills, fever, night sweats or weight changes.  Cardiovascular: negative for chest pain, dyspnea on exertion, edema, orthopnea, palpitations, paroxysmal nocturnal dyspnea or shortness of breath Dermatological: negative for rash Respiratory: negative for cough or wheezing Urologic: negative for hematuria Abdominal: negative for nausea, vomiting, diarrhea, bright red blood per rectum, melena, or hematemesis Neurologic: negative for visual changes, syncope, or dizziness All other systems reviewed and are otherwise negative except as noted above.    Blood pressure 124/68, pulse 88, height 5\' 11"  (1.803 m), weight 187 lb 3.2 oz (84.9 kg), SpO2 94%.  General appearance: alert and no distress Neck: no adenopathy, no carotid bruit, no JVD, supple, symmetrical, trachea midline, and thyroid not enlarged, symmetric, no tenderness/mass/nodules Lungs: clear to auscultation bilaterally Heart: regular rate and rhythm, S1, S2 normal, no murmur, click, rub or gallop Extremities: extremities normal, atraumatic, no cyanosis or edema Pulses: Diminished pedal pulses Skin: Skin color, texture, turgor normal. No rashes or lesions Neurologic: Grossly normal  EKG not performed today      ASSESSMENT AND PLAN:   Claudication in peripheral vascular disease [facility]) Mr. Bedell returns today for follow-up of PAD.  He has a history of bilateral SFA intervention and left common iliac artery intervention.  When I saw him 3 months ago he was complaining of right calf and foot pain.  His most recent Doppler studies performed 11/05/2022 revealed a right ABI of 0.55 and a left of 0.82.  His left iliac stent was patent.  He had potentially an occlusion of his right  popliteal artery.  He wishes to proceed with angiography and potential intervention.     Runell Gess MD FACP,FACC,FAHA, [facility] 03/06/2023 11:59 AM

## 2023-03-06 NOTE — Assessment & Plan Note (Signed)
Mr. Rusek returns today for follow-up of PAD.  He has a history of bilateral SFA intervention and left common iliac artery intervention.  When I saw him 3 months ago he was complaining of right calf and foot pain.  His most recent Doppler studies performed 11/05/2022 revealed a right ABI of 0.55 and a left of 0.82.  His left iliac stent was patent.  He had potentially an occlusion of his right popliteal artery.  He wishes to proceed with angiography and potential intervention.

## 2023-03-06 NOTE — Progress Notes (Signed)
03/06/2023 [name]   [birth year]  [id number]  Primary Physician Shaun Rutherford, NP Primary Cardiologist: Shaun Gess MD [name], [place]  HPI:  [name] is a 62 y.o.    mildly overweight single, though lives with his girlfriend [name], Caucasian male father of one child seen for peripheral vascular disease because of claudication and diminished ABIs.  I last saw him in the office 11/20/2022.  Risk factors include continued tobacco abuse of one pack -2 pks per day, diabetes and hyperlipidemia. He was catheterized by Dr. Marinus Reed 08/19/2012 demonstrated three-vessel disease. He ultimately underwent coronary artery bypass grafting x4 January 17 for LIMA to his LAD, a vein to the diagonal branch, ramus intermedius branch and to the RCA. He tolerated the procedure well. His EF was 50-55% by 2-D echo. Dopplers revealed ABIs of 0.5 and 0.7. He complaind of left greater than right claudication at one block. He underwent percutaneous revascularization using the Viance CTO catheter, diamondback orbital rotational atherectomy, chocolate balloon and IDEV Stent successfully to the mid Rt SFA. He enjoyed an excellent angiographic and clinical result with improvement Dopplers. He underwent an attempt at percutaneous revascularization of his long segment calcified left SFA CTO for lifestyle limiting claudication.I was unable to cross the lesion with a Viance CTO catheter.the patient presents in followup. Continues to have left lower extremity claudication. Also continues to smoke. We discussed options including retrograde attempt at history catheterization by Dr. Hoy Reed in [place] versus femoropopliteal bypass grafting. Since she's had coronary bypass grafting, I suspect he does not have adequate venous conduits left to perform lower shimmy surgical room randomization and what was thought to require PTFE.  I did send him to [facility] where Dr. Hoy Reed been 6 hours revascularizing a  left SFA CTO successfully.  He is continue to smoke a pack a day.  Over the last several months he has noticed increasing left greater than right lower extremity lifestyle of any claudication with recent Doppler studies performed 03/16/2021 revealing a right ABI of 0.80 and a left of 0.74.  He does have a high-frequency signal in his left SFA.  He wishes to proceed with outpatient endovascular therapy.   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  He underwent orbital atherectomy followed by VBX covered stenting (8 mm x 29 mm).  He was discharged home the following day.  His Dopplers performed 04/03/2021 revealed a widely patent stent.  His claudication resolved after that time.  He does unfortunately continue to smoke.   He has had recurrent claudication symptoms beginning of this year.  He had Dopplers performed 02/20/2022 revealing right ABI of 0.73 and a left ABI of 0.61.  He does have a high-frequency signal in his proximal left SFA with patent SFA stents bilaterally.  He wishes to proceed with outpatient peripheral angiography.   I performed angiography and intervention on him 04/12/2022 revealing patent left common iliac artery stent, patent bilateral SFA stents and a subtotally occluded calcified proximal left left SFA stenosis which I intervened on using chocolate balloon angioplasty followed by DCB.  His claudication had resolved and his Dopplers have markedly improved.  He does continue to smoke unfortunately.   Since I saw him 3 months ago I had intended perform right lower extremity intervention however this was delayed because of a intracranial coiling procedure.  He ultimately underwent right total hip replacement with Dr.  Landau in had pneumonia as well.  He returns now because of right calf and foot pain and wishes to proceed with outpatient peripheral angiography and endovascular therapy.  He does  continue to smoke a pack and a half a day.   Current Meds  Medication Sig   acetaminophen (TYLENOL) 650 MG CR tablet Take 1,300 mg by mouth daily as needed for pain.   albuterol (VENTOLIN HFA) 108 (90 Base) MCG/ACT inhaler Inhale 2 puffs into the lungs every 6 (six) hours as needed for wheezing or shortness of breath.   aspirin EC 81 MG EC tablet Take 1 tablet (81 mg total) by mouth daily.   Cholecalciferol (VITAMIN D-3 PO) Take 1 capsule by mouth daily.   clopidogrel (PLAVIX) 75 MG tablet Take 1 tablet (75 mg total) by mouth daily with breakfast.   cyclobenzaprine (FLEXERIL) 10 MG tablet Take 1 tablet (10 mg total) by mouth every 8 (eight) hours as needed for muscle spasms.   empagliflozin (JARDIANCE) 25 MG TABS tablet Take 25 mg by mouth daily.   folic acid (FOLVITE) 1 MG tablet Take 1 tablet (1 mg total) by mouth daily.   gabapentin (NEURONTIN) 600 MG tablet Take 600 mg by mouth 3 (three) times daily.   glipiZIDE (GLUCOTROL XL) 10 MG 24 hr tablet Take 10 mg by mouth 2 (two) times daily.   icosapent Ethyl (VASCEPA) 1 g capsule Take 2 g by mouth 2 (two) times daily.   isosorbide mononitrate (IMDUR) 60 MG 24 hr tablet Take 1 tablet (60 mg total) by mouth daily.   losartan (COZAAR) 50 MG tablet Take 50 mg by mouth daily.   metoprolol succinate (TOPROL-XL) 25 MG 24 hr tablet TAKE 1 TABLET BY MOUTH ONCE A DAY.   ondansetron (ZOFRAN) 4 MG tablet Take 1 tablet (4 mg total) by mouth every 8 (eight) hours as needed for nausea or vomiting.   oxyCODONE-acetaminophen (PERCOCET) 10-325 MG tablet Take 1 tablet by mouth 4 (four) times daily as needed for pain.   rosuvastatin (CRESTOR) 40 MG tablet Take 1 tablet (40 mg total) by mouth daily.   thiamine (VITAMIN B-1) 100 MG tablet Take 1 tablet (100 mg total) by mouth daily.   zolpidem (AMBIEN CR) 12.5 MG CR tablet Take 12.5 mg by mouth at bedtime.     No Known Allergies  Social History   Socioeconomic History   Marital status: Single    Spouse  name: Not on file   Number of children: 1   Years of education: Not on file   Highest education level: Not on file  Occupational History   Occupation: disabled    Employer: DISABLED  Tobacco Use   Smoking status: Every Day    Current packs/day: 1.50    Average packs/day: 1.5 packs/day for 39.0 years (58.5 ttl pk-yrs)    Types: Cigarettes   Smokeless tobacco: Never  Vaping Use   Vaping status: Never Used  Substance and Sexual Activity   Alcohol use: Yes    Alcohol/week: 12.0 standard drinks of alcohol    Types: 12 Cans of beer per week    Comment: weekly   Drug use: No   Sexual activity: Not Currently  Other Topics Concern   Not on file  Social History Narrative   Lives w/ girlfriend   Social Determinants of Health   Financial Resource Strain: Low Risk  (08/15/2022)   Received from [facility], [facility]   Overall Financial Resource Strain (CARDIA)    Difficulty of  Paying Living Expenses: Not very hard  Food Insecurity: No Food Insecurity (02/10/2023)   Hunger Vital Sign    Worried About Running Out of Food in the Last Year: Never true    Ran Out of Food in the Last Year: Never true  Transportation Needs: No Transportation Needs (02/10/2023)   PRAPARE - Administrator, Civil Service (Medical): No    Lack of Transportation (Non-Medical): No  Physical Activity: Unknown (08/15/2022)   Received from [facility], [facility]   Exercise Vital Sign    Days of Exercise per Week: 0 days    Minutes of Exercise per Session: Not on file  Stress: No Stress Concern Present (08/15/2022)   Received from [facility], [facility] of Occupational Health - Occupational Stress Questionnaire    Feeling of Stress : Not at all  Social Connections: Moderately Integrated (08/15/2022)   Received from [facility], [facility]   Social Network    How would you rate your social network (family, work, friends)?: Adequate participation with social networks   Intimate Partner Violence: Not At Risk (02/10/2023)   Humiliation, Afraid, Rape, and Kick questionnaire    Fear of Current or Ex-Partner: No    Emotionally Abused: No    Physically Abused: No    Sexually Abused: No     Review of Systems: General: negative for chills, fever, night sweats or weight changes.  Cardiovascular: negative for chest pain, dyspnea on exertion, edema, orthopnea, palpitations, paroxysmal nocturnal dyspnea or shortness of breath Dermatological: negative for rash Respiratory: negative for cough or wheezing Urologic: negative for hematuria Abdominal: negative for nausea, vomiting, diarrhea, bright red blood per rectum, melena, or hematemesis Neurologic: negative for visual changes, syncope, or dizziness All other systems reviewed and are otherwise negative except as noted above.    Blood pressure 124/68, pulse 88, height 5\' 11"  (1.803 m), weight 187 lb 3.2 oz (84.9 kg), SpO2 94%.  General appearance: alert and no distress Neck: no adenopathy, no carotid bruit, no JVD, supple, symmetrical, trachea midline, and thyroid not enlarged, symmetric, no tenderness/mass/nodules Lungs: clear to auscultation bilaterally Heart: regular rate and rhythm, S1, S2 normal, no murmur, click, rub or gallop Extremities: extremities normal, atraumatic, no cyanosis or edema Pulses: Diminished pedal pulses Skin: Skin color, texture, turgor normal. No rashes or lesions Neurologic: Grossly normal  EKG not performed today      ASSESSMENT AND PLAN:   Claudication in peripheral vascular disease [facility]) Mr. Bedell returns today for follow-up of PAD.  He has a history of bilateral SFA intervention and left common iliac artery intervention.  When I saw him 3 months ago he was complaining of right calf and foot pain.  His most recent Doppler studies performed 11/05/2022 revealed a right ABI of 0.55 and a left of 0.82.  His left iliac stent was patent.  He had potentially an occlusion of his right  popliteal artery.  He wishes to proceed with angiography and potential intervention.     Shaun Gess MD FACP,FACC,FAHA, [facility] 03/06/2023 11:59 AM

## 2023-03-06 NOTE — Patient Instructions (Addendum)
Medication Instructions:  Your physician recommends that you continue on your current medications as directed. Please refer to the Current Medication list given to you today.  *If you need a refill on your cardiac medications before your next appointment, please call your pharmacy*   Lab Work: Your physician recommends that you have labs drawn today: BMET & CBC  If you have labs (blood work) drawn today and your tests are completely normal, you will receive your results only by: MyChart Message (if you have MyChart) OR A paper copy in the mail If you have any lab test that is abnormal or we need to change your treatment, we will call you to review the results.    Testing/Procedures: Your physician has requested that you have an Aorta/Iliac Duplex. This will be take place at [address] [facility], [address].  No food after 11PM the night before.  Water is OK. (Don't drink liquids if you have been instructed not to for ANOTHER test) Avoid foods that produce bowel gas, for 24 hours prior to exam (see below). No breakfast, no chewing gum, no smoking or carbonated beverages. Patient may take morning medications with water. Come in for test at least 15 minutes early to register. To do 1-2 weeks after your procedure (8/26)  Your physician has requested that you have a lower extremity arterial duplex. During this test, ultrasound is used to evaluate arterial blood flow in the legs. Allow one hour for this exam. There are no restrictions or special instructions. This will take place at [address] [facility], [address]. To do 1-2 weeks after your procedure (8/26).  Your physician has requested that you have an ankle brachial index (ABI). During this test an ultrasound and blood pressure cuff are used to evaluate the arteries that supply the arms and legs with blood. Allow thirty minutes for this exam. There are no restrictions or special instructions. This will take place at [address] [facility], [address]. To do 1-2 weeks after your procedure (8/26).     Follow-Up: At [facility], you and your health needs are our priority.  As part of our continuing mission to provide you with exceptional heart care, we have created designated Provider Care Teams.  These Care Teams include your primary Cardiologist (physician) and Advanced Practice Providers (APPs -  Physician Assistants and Nurse Practitioners) who all work together to provide you with the care you need, when you need it.  We recommend signing up for the patient portal called "MyChart".  Sign up information is provided on this After Visit Summary.  MyChart is used to connect with patients for Virtual Visits (Telemedicine).  Patients are able to view lab/test results, encounter notes, upcoming appointments, etc.  Non-urgent messages can be sent to your provider as well.   To learn more about what you can do with MyChart, go to [email or web address].    Your next appointment:   2-3 week(s) after your procedure (8/26)  Provider:   Nanetta Batty, MD   Other Instructions       Cardiac/Peripheral Catheterization   You are scheduled for a Peripheral Angiogram on Monday, August 26 with Dr. Nanetta Batty.  1. Please arrive at the [facility] (Main Entrance A) at [facility]: [address] [place], [place] [address] at 10:00 AM (This time is 2 hour(s) before your procedure to ensure your preparation). Free valet parking service is available. You will check in at ADMITTING. The support person will be asked to wait  in the waiting room.  It is OK to have someone drop you off and come back when you are ready to be discharged.        Special note: Every effort is made to have your procedure done on time. Please understand that emergencies sometimes delay scheduled procedures.  2. Diet: Do not eat solid foods after midnight.  You may have clear liquids until 5 AM the day of the procedure.  3. Labs: You will need to have  blood drawn today (7/31).  4. Medication instructions in preparation for your procedure:  Hold Jardiance on Friday, August 23rd   Do not take Diabetes Med Glipizide on the day of the procedure and HOLD 48 HOURS AFTER THE PROCEDURE.  On the morning of your procedure, take Aspirin 81 mg and Plavix/Clopidogrel and any morning medicines NOT listed above.  You may use sips of water.  5. Plan to go home the same day, you will only stay overnight if medically necessary. 6. You MUST have a responsible adult to drive you home. 7. An adult MUST be with you the first 24 hours after you arrive home. 8. Bring a current list of your medications, and the last time and date medication taken. 9. Bring ID and current insurance cards. 10.Please wear clothes that are easy to get on and off and wear slip-on shoes.  Thank you for allowing [place] to care for you!   -- [facility]

## 2023-03-20 DIAGNOSIS — E1165 Type 2 diabetes mellitus with hyperglycemia: Secondary | ICD-10-CM | POA: Diagnosis not present

## 2023-03-20 DIAGNOSIS — Z0000 Encounter for general adult medical examination without abnormal findings: Secondary | ICD-10-CM | POA: Diagnosis not present

## 2023-03-20 DIAGNOSIS — I739 Peripheral vascular disease, unspecified: Secondary | ICD-10-CM | POA: Diagnosis not present

## 2023-03-22 ENCOUNTER — Other Ambulatory Visit: Payer: Self-pay

## 2023-03-22 DIAGNOSIS — I739 Peripheral vascular disease, unspecified: Secondary | ICD-10-CM

## 2023-03-28 ENCOUNTER — Telehealth: Payer: Self-pay | Admitting: *Deleted

## 2023-03-28 NOTE — Telephone Encounter (Addendum)
Abdominal aortogram scheduled at [facility] for: Monday April 01, 2023 12 Noon Arrival time [facility] Main Entrance A at: 10 AM  Nothing to eat after midnight prior to procedure, clear liquids until 5 AM day of procedure.  Medication instructions: -Hold:  Glipizide/Jardiance-AM of procedure -Other usual morning medications can be taken with sips of water including aspirin 81 mg and Plavix 75 mg  Plan to go home the same day, you will only stay overnight if medically necessary.  You must have responsible adult to drive you home.  Someone must be with you the first 24 hours after you arrive home.  Left message for patient to call back to review procedure instructions.  Left detailed procedure instructions on voicemail (DPR).

## 2023-04-01 ENCOUNTER — Ambulatory Visit (HOSPITAL_COMMUNITY)
Admission: RE | Disposition: A | Discharge status: Home / Self Care | Payer: Self-pay | Source: Home / Self Care | Attending: Cardiovascular Disease

## 2023-04-01 ENCOUNTER — Ambulatory Visit (HOSPITAL_COMMUNITY)
Admission: RE | Admit: 2023-04-01 | Discharge: 2023-04-02 | Disposition: A | Discharge status: Home / Self Care | Payer: 59 | Attending: Cardiovascular Disease | Admitting: Cardiovascular Disease

## 2023-04-01 ENCOUNTER — Encounter (HOSPITAL_COMMUNITY): Payer: Self-pay | Admitting: Cardiovascular Disease

## 2023-04-01 ENCOUNTER — Other Ambulatory Visit: Payer: Self-pay

## 2023-04-01 DIAGNOSIS — Z951 Presence of aortocoronary bypass graft: Secondary | ICD-10-CM | POA: Diagnosis not present

## 2023-04-01 DIAGNOSIS — I70211 Atherosclerosis of native arteries of extremities with intermittent claudication, right leg: Secondary | ICD-10-CM | POA: Insufficient documentation

## 2023-04-01 DIAGNOSIS — E785 Hyperlipidemia, unspecified: Secondary | ICD-10-CM | POA: Diagnosis not present

## 2023-04-01 DIAGNOSIS — I739 Peripheral vascular disease, unspecified: Secondary | ICD-10-CM | POA: Diagnosis present

## 2023-04-01 DIAGNOSIS — I2510 Atherosclerotic heart disease of native coronary artery without angina pectoris: Secondary | ICD-10-CM | POA: Diagnosis not present

## 2023-04-01 DIAGNOSIS — Z96641 Presence of right artificial hip joint: Secondary | ICD-10-CM | POA: Diagnosis not present

## 2023-04-01 DIAGNOSIS — I10 Essential (primary) hypertension: Secondary | ICD-10-CM | POA: Diagnosis present

## 2023-04-01 DIAGNOSIS — F17210 Nicotine dependence, cigarettes, uncomplicated: Secondary | ICD-10-CM | POA: Insufficient documentation

## 2023-04-01 DIAGNOSIS — E118 Type 2 diabetes mellitus with unspecified complications: Secondary | ICD-10-CM

## 2023-04-01 DIAGNOSIS — E1151 Type 2 diabetes mellitus with diabetic peripheral angiopathy without gangrene: Secondary | ICD-10-CM | POA: Diagnosis not present

## 2023-04-01 HISTORY — PX: ABDOMINAL AORTOGRAM W/LOWER EXTREMITY: CATH118223

## 2023-04-01 HISTORY — PX: PERIPHERAL VASCULAR BALLOON ANGIOPLASTY: CATH118281

## 2023-04-01 HISTORY — PX: PERIPHERAL VASCULAR ATHERECTOMY: CATH118256

## 2023-04-01 LAB — POCT ACTIVATED CLOTTING TIME
Activated Clotting Time: 177 seconds
Activated Clotting Time: 183 seconds

## 2023-04-01 LAB — GLUCOSE, CAPILLARY
Glucose-Capillary: 227 mg/dL — ABNORMAL HIGH (ref 70–99)
Glucose-Capillary: 156 mg/dL — ABNORMAL HIGH (ref 70–99)

## 2023-04-01 SURGERY — ABDOMINAL AORTOGRAM W/LOWER EXTREMITY
Anesthesia: LOCAL | Laterality: Right

## 2023-04-01 MED ORDER — ASPIRIN 81 MG PO CHEW: 81.0000 mg | CHEWABLE_TABLET | ORAL | Status: DC

## 2023-04-01 MED ORDER — CLOPIDOGREL BISULFATE 75 MG PO TABS
75.0000 mg | ORAL_TABLET | Freq: Every day | ORAL | Status: DC
  Administered 2023-04-02: 75 mg via ORAL
  Filled 2023-04-01: qty 1

## 2023-04-01 MED ORDER — ACETAMINOPHEN 325 MG PO TABS: 650.0000 mg | ORAL_TABLET | ORAL | Status: DC | PRN

## 2023-04-01 MED ORDER — MIDAZOLAM HCL 2 MG/2ML IJ SOLN
INTRAMUSCULAR | Status: AC
  Filled 2023-04-01: qty 2

## 2023-04-01 MED ORDER — ISOSORBIDE MONONITRATE ER 60 MG PO TB24
60.0000 mg | ORAL_TABLET | Freq: Every day | ORAL | Status: DC
  Administered 2023-04-01 – 2023-04-02 (×2): 60 mg via ORAL
  Filled 2023-04-01 (×2): qty 1

## 2023-04-01 MED ORDER — VIPERSLIDE LUBRICANT OPTIME
TOPICAL | Status: DC | PRN
  Administered 2023-04-01: 200 mL via SURGICAL_CAVITY

## 2023-04-01 MED ORDER — HYDRALAZINE HCL 20 MG/ML IJ SOLN: 5.0000 mg | INTRAMUSCULAR | Status: DC | PRN

## 2023-04-01 MED ORDER — NITROGLYCERIN IN D5W 200-5 MCG/ML-% IV SOLN
INTRAVENOUS | Status: AC
  Filled 2023-04-01: qty 250

## 2023-04-01 MED ORDER — ASPIRIN 81 MG PO TBEC
81.0000 mg | DELAYED_RELEASE_TABLET | Freq: Every day | ORAL | Status: DC
  Administered 2023-04-01 – 2023-04-02 (×2): 81 mg via ORAL
  Filled 2023-04-01 (×2): qty 1

## 2023-04-01 MED ORDER — SODIUM CHLORIDE 0.9 % IV SOLN: 250.0000 mL | INTRAVENOUS | Status: DC | PRN

## 2023-04-01 MED ORDER — HEPARIN SODIUM (PORCINE) 1000 UNIT/ML IJ SOLN
INTRAMUSCULAR | Status: AC
  Filled 2023-04-01: qty 10

## 2023-04-01 MED ORDER — NITROGLYCERIN 1 MG/10 ML FOR IR/CATH LAB
INTRA_ARTERIAL | Status: DC | PRN
  Administered 2023-04-01 (×3): 200 ug via INTRA_ARTERIAL

## 2023-04-01 MED ORDER — MORPHINE SULFATE (PF) 2 MG/ML IV SOLN
INTRAVENOUS | Status: AC
  Filled 2023-04-01: qty 1

## 2023-04-01 MED ORDER — ALBUTEROL SULFATE HFA 108 (90 BASE) MCG/ACT IN AERS: 2.0000 | INHALATION_SPRAY | Freq: Four times a day (QID) | RESPIRATORY_TRACT | Status: DC | PRN

## 2023-04-01 MED ORDER — CLOPIDOGREL BISULFATE 300 MG PO TABS
ORAL_TABLET | ORAL | Status: AC
  Filled 2023-04-01: qty 1

## 2023-04-01 MED ORDER — IODIXANOL 320 MG/ML IV SOLN
INTRAVENOUS | Status: DC | PRN
  Administered 2023-04-01: 200 mL

## 2023-04-01 MED ORDER — SODIUM CHLORIDE 0.9 % WEIGHT BASED INFUSION
3.0000 mL/kg/h | INTRAVENOUS | Status: DC
  Administered 2023-04-01: 3 mL/kg/h via INTRAVENOUS

## 2023-04-01 MED ORDER — VERAPAMIL HCL 2.5 MG/ML IV SOLN
INTRAVENOUS | Status: AC
  Filled 2023-04-01: qty 4

## 2023-04-01 MED ORDER — FENTANYL CITRATE (PF) 100 MCG/2ML IJ SOLN
INTRAMUSCULAR | Status: AC
  Filled 2023-04-01: qty 2

## 2023-04-01 MED ORDER — LIDOCAINE HCL (PF) 1 % IJ SOLN
INTRAMUSCULAR | Status: DC | PRN
  Administered 2023-04-01: 20 mL

## 2023-04-01 MED ORDER — MORPHINE SULFATE (PF) 2 MG/ML IV SOLN
2.0000 mg | INTRAVENOUS | Status: DC | PRN
  Administered 2023-04-01 (×2): 2 mg via INTRAVENOUS
  Filled 2023-04-01: qty 1

## 2023-04-01 MED ORDER — HYDRALAZINE HCL 20 MG/ML IJ SOLN
INTRAMUSCULAR | Status: AC
  Filled 2023-04-01: qty 1

## 2023-04-01 MED ORDER — ONDANSETRON HCL 4 MG/2ML IJ SOLN: 4.0000 mg | Freq: Four times a day (QID) | INTRAMUSCULAR | Status: DC | PRN

## 2023-04-01 MED ORDER — GLIPIZIDE ER 10 MG PO TB24
10.0000 mg | ORAL_TABLET | Freq: Two times a day (BID) | ORAL | Status: DC
  Administered 2023-04-02: 10 mg via ORAL
  Filled 2023-04-01: qty 1

## 2023-04-01 MED ORDER — SODIUM CHLORIDE 0.9 % IV SOLN: INTRAVENOUS | Status: AC

## 2023-04-01 MED ORDER — SODIUM CHLORIDE 0.9% FLUSH: 3.0000 mL | INTRAVENOUS | Status: DC | PRN

## 2023-04-01 MED ORDER — SODIUM CHLORIDE 0.9% FLUSH
3.0000 mL | Freq: Two times a day (BID) | INTRAVENOUS | Status: DC
  Administered 2023-04-01 – 2023-04-02 (×2): 3 mL via INTRAVENOUS

## 2023-04-01 MED ORDER — HYDRALAZINE HCL 20 MG/ML IJ SOLN
INTRAMUSCULAR | Status: DC | PRN
  Administered 2023-04-01: 10 mg via INTRAVENOUS

## 2023-04-01 MED ORDER — FENTANYL CITRATE (PF) 100 MCG/2ML IJ SOLN
INTRAMUSCULAR | Status: DC | PRN
  Administered 2023-04-01 (×2): 25 ug via INTRAVENOUS

## 2023-04-01 MED ORDER — MIDAZOLAM HCL 2 MG/2ML IJ SOLN
INTRAMUSCULAR | Status: DC | PRN
  Administered 2023-04-01: 1 mg via INTRAVENOUS

## 2023-04-01 MED ORDER — LIDOCAINE HCL (PF) 1 % IJ SOLN
INTRAMUSCULAR | Status: AC
  Filled 2023-04-01: qty 30

## 2023-04-01 MED ORDER — ICOSAPENT ETHYL 1 G PO CAPS
2.0000 g | ORAL_CAPSULE | Freq: Two times a day (BID) | ORAL | Status: DC
  Administered 2023-04-01 – 2023-04-02 (×2): 2 g via ORAL
  Filled 2023-04-01 (×3): qty 2

## 2023-04-01 MED ORDER — METOPROLOL SUCCINATE ER 25 MG PO TB24
25.0000 mg | ORAL_TABLET | Freq: Every day | ORAL | Status: DC
  Administered 2023-04-01 – 2023-04-02 (×2): 25 mg via ORAL
  Filled 2023-04-01 (×2): qty 1

## 2023-04-01 MED ORDER — LOSARTAN POTASSIUM 50 MG PO TABS
50.0000 mg | ORAL_TABLET | Freq: Every day | ORAL | Status: DC
  Administered 2023-04-01 – 2023-04-02 (×2): 50 mg via ORAL
  Filled 2023-04-01 (×2): qty 1

## 2023-04-01 MED ORDER — HEPARIN SODIUM (PORCINE) 1000 UNIT/ML IJ SOLN
INTRAMUSCULAR | Status: DC | PRN
  Administered 2023-04-01: 9000 [IU] via INTRAVENOUS

## 2023-04-01 MED ORDER — ROSUVASTATIN CALCIUM 20 MG PO TABS
40.0000 mg | ORAL_TABLET | Freq: Every day | ORAL | Status: DC
  Administered 2023-04-01 – 2023-04-02 (×2): 40 mg via ORAL
  Filled 2023-04-01 (×2): qty 2

## 2023-04-01 MED ORDER — EMPAGLIFLOZIN 25 MG PO TABS
25.0000 mg | ORAL_TABLET | Freq: Every day | ORAL | Status: DC
  Administered 2023-04-01 – 2023-04-02 (×2): 25 mg via ORAL
  Filled 2023-04-01 (×3): qty 1

## 2023-04-01 MED ORDER — LABETALOL HCL 5 MG/ML IV SOLN
INTRAVENOUS | Status: AC
  Administered 2023-04-01: 10 mg
  Filled 2023-04-01: qty 4

## 2023-04-01 MED ORDER — ALBUTEROL SULFATE (2.5 MG/3ML) 0.083% IN NEBU: 2.5000 mg | INHALATION_SOLUTION | Freq: Four times a day (QID) | RESPIRATORY_TRACT | Status: DC | PRN

## 2023-04-01 MED ORDER — LABETALOL HCL 5 MG/ML IV SOLN: 10.0000 mg | INTRAVENOUS | Status: DC | PRN

## 2023-04-01 MED ORDER — ASPIRIN 81 MG PO TBEC: 81.0000 mg | DELAYED_RELEASE_TABLET | Freq: Every day | ORAL | Status: DC

## 2023-04-01 MED ORDER — ATORVASTATIN CALCIUM 80 MG PO TABS: 80.0000 mg | ORAL_TABLET | Freq: Every day | ORAL | Status: DC

## 2023-04-01 MED ORDER — SODIUM CHLORIDE 0.9 % WEIGHT BASED INFUSION: 1.0000 mL/kg/h | INTRAVENOUS | Status: DC

## 2023-04-01 MED ORDER — HEPARIN (PORCINE) IN NACL 1000-0.9 UT/500ML-% IV SOLN
INTRAVENOUS | Status: DC | PRN
  Administered 2023-04-01 (×2): 500 mL

## 2023-04-01 MED ORDER — CLOPIDOGREL BISULFATE 75 MG PO TABS: 75.0000 mg | ORAL_TABLET | Freq: Every day | ORAL | Status: DC

## 2023-04-01 SURGICAL SUPPLY — 29 items
BAG SNAP BAND KOVER 36X36 (MISCELLANEOUS) IMPLANT
BALLN IN.PACT DCB 5X120 (BALLOONS) ×3
BALLN MUSTANG 5X80X135 (BALLOONS)
BALLN STERLING OTW 5X80X135 (BALLOONS) ×3
BALLOON MUSTANG 5X80X135 (BALLOONS) IMPLANT
BALLOON STERLING OTW 5X80X135 (BALLOONS) IMPLANT
CATH ANGIO 5F PIGTAIL 65CM (CATHETERS) IMPLANT
CATH CROSS OVER TEMPO 5F (CATHETERS) IMPLANT
CATH NAVICROSS ANGLED 90CM (MICROCATHETER) IMPLANT
CATH QUICKCROSS .018X135CM (MICROCATHETER) IMPLANT
COVER DOME SNAP 22 D (MISCELLANEOUS) IMPLANT
DCB IN.PACT 5X120 (BALLOONS) IMPLANT
DEVICE EMBOSHIELD NAV6 2.5-4.8 (FILTER) IMPLANT
DEVICE TORQUE .025-.038 (MISCELLANEOUS) IMPLANT
DIAMONDBACK SOLID OAS 1.5MM (CATHETERS) ×3
GUIDEWIRE ANGLED .035X150CM (WIRE) IMPLANT
KIT ENCORE 26 ADVANTAGE (KITS) IMPLANT
LUBRICANT VIPERSLIDE CORONARY (MISCELLANEOUS) IMPLANT
SET ATX-X65L (MISCELLANEOUS) IMPLANT
SHEATH CATAPULT 6FR 45 (SHEATH) IMPLANT
SHEATH PINNACLE 5F 10CM (SHEATH) IMPLANT
SHEATH PINNACLE 6F 10CM (SHEATH) IMPLANT
SHEATH PROBE COVER 6X72 (BAG) IMPLANT
SYSTEM DIMNDBCK SLD OAS 1.5MM (CATHETERS) IMPLANT
TAPE SHOOT N SEE (TAPE) IMPLANT
TRAY PV CATH (CUSTOM PROCEDURE TRAY) ×3 IMPLANT
WIRE HITORQ VERSACORE ST 145CM (WIRE) IMPLANT
WIRE SHEPHERD 6G .014 (WIRE) IMPLANT
WIRE VIPER ADVANCE .017X335CM (WIRE) IMPLANT

## 2023-04-01 NOTE — Plan of Care (Signed)

## 2023-04-01 NOTE — Interval H&P Note (Signed)
History and Physical Interval Note:  04/01/2023 12:46 PM  [name]  has presented today for surgery, with the diagnosis of pad.  The various methods of treatment have been discussed with the patient and family. After consideration of risks, benefits and other options for treatment, the patient has consented to  Procedure(s): ABDOMINAL AORTOGRAM W/LOWER EXTREMITY (N/A) as a surgical intervention.  The patient's history has been reviewed, patient examined, no change in status, stable for surgery.  I have reviewed the patient's chart and labs.  Questions were answered to the patient's satisfaction.     Nanetta Batty

## 2023-04-01 NOTE — Progress Notes (Signed)
73fr sheath aspirated and removed from left femoral artery. Manual pressure applied for 30 minutes. Site level 0 , no s+s of hematoma. Tegaderm dressing applied, bedrest instructions given.   Left dp and pt pulses present with doppler Right dp and pt also present with doppler, and location marked.     Bedrest begins at 17:06:00

## 2023-04-02 ENCOUNTER — Encounter (HOSPITAL_COMMUNITY): Payer: Self-pay | Admitting: Cardiovascular Disease

## 2023-04-02 DIAGNOSIS — I70211 Atherosclerosis of native arteries of extremities with intermittent claudication, right leg: Secondary | ICD-10-CM | POA: Diagnosis not present

## 2023-04-02 DIAGNOSIS — Z951 Presence of aortocoronary bypass graft: Secondary | ICD-10-CM | POA: Diagnosis not present

## 2023-04-02 DIAGNOSIS — F17210 Nicotine dependence, cigarettes, uncomplicated: Secondary | ICD-10-CM | POA: Diagnosis not present

## 2023-04-02 DIAGNOSIS — I739 Peripheral vascular disease, unspecified: Secondary | ICD-10-CM

## 2023-04-02 DIAGNOSIS — Z96641 Presence of right artificial hip joint: Secondary | ICD-10-CM | POA: Diagnosis not present

## 2023-04-02 DIAGNOSIS — I2510 Atherosclerotic heart disease of native coronary artery without angina pectoris: Secondary | ICD-10-CM | POA: Diagnosis not present

## 2023-04-02 DIAGNOSIS — E785 Hyperlipidemia, unspecified: Secondary | ICD-10-CM | POA: Diagnosis not present

## 2023-04-02 DIAGNOSIS — E1151 Type 2 diabetes mellitus with diabetic peripheral angiopathy without gangrene: Secondary | ICD-10-CM | POA: Diagnosis not present

## 2023-04-02 DIAGNOSIS — I10 Essential (primary) hypertension: Secondary | ICD-10-CM | POA: Diagnosis not present

## 2023-04-02 LAB — BASIC METABOLIC PANEL
Anion gap: 9 (ref 5–15)
BUN: 10 mg/dL (ref 8–23)
CO2: 25 mmol/L (ref 22–32)
Calcium: 8.6 mg/dL — ABNORMAL LOW (ref 8.9–10.3)
Chloride: 102 mmol/L (ref 98–111)
Creatinine, Ser: 0.9 mg/dL (ref 0.61–1.24)
GFR, Estimated: >60 mL/min (ref >60)
Glucose, Bld: 200 mg/dL — ABNORMAL HIGH (ref 70–99)
Potassium: 3.8 mmol/L (ref 3.5–5.1)
Sodium: 136 mmol/L (ref 135–145)

## 2023-04-02 LAB — LIPID PANEL
Cholesterol: 145 mg/dL (ref 0–200)
HDL: 37 mg/dL — ABNORMAL LOW (ref >40)
LDL Cholesterol: 40 mg/dL (ref 0–99)
Total CHOL/HDL Ratio: 3.9 RATIO
Triglycerides: 341 mg/dL — ABNORMAL HIGH (ref <150)
VLDL: 68 mg/dL — ABNORMAL HIGH (ref 0–40)

## 2023-04-02 LAB — CBC
HCT: 41.5 % (ref 39.0–52.0)
Hemoglobin: 13.5 g/dL (ref 13.0–17.0)
MCH: 29.5 pg (ref 26.0–34.0)
MCHC: 32.5 g/dL (ref 30.0–36.0)
MCV: 90.8 fL (ref 80.0–100.0)
Platelets: 161 10*3/uL (ref 150–400)
RBC: 4.57 MIL/uL (ref 4.22–5.81)
RDW: 14.9 % (ref 11.5–15.5)
WBC: 8.1 10*3/uL (ref 4.0–10.5)
nRBC: 0 % (ref 0.0–0.2)

## 2023-04-02 MED FILL — Clopidogrel Bisulfate Tab 300 MG (Base Equiv): ORAL | Qty: 1 | Status: AC

## 2023-04-02 NOTE — Discharge Summary (Signed)
Discharge Summary    Patient ID: [name] MRN: [id number]; DOB: 02-17-1961  Admit date: 04/01/2023 Discharge date: 04/02/2023  PCP:  Shaun Rutherford, NP   [facility] Providers Cardiologist:  Shaun Nose, MD        Discharge Diagnoses    Principal Problem:   Claudication in peripheral vascular disease [facility]) Active Problems:   Type 2 diabetes mellitus with complication, with long-term current use of insulin (HCC)   Essential hypertension   CAD in native artery   S/P CABG x 4    Diagnostic Studies/Procedures    LE angiography 04/01/2023 Pre Procedure Diagnosis: Peripheral arterial disease   Post Procedure Diagnosis: Peripheral arterial disease   Operators: Dr. Nanetta Reed   Procedures Performed:             1.  Left common femoral access             2.  Abdominal aortogram/bilateral iliac angiogram/bifemoral runoff             3.  Contralateral access (second-order catheter placement)             4.  Placement of a Nav 6 distal protection device in the right below the knee popliteal artery             5.  Orbital atherectomy right SFA             6.  DCB right SFA   PROCEDURE DESCRIPTION:    The patient was brought to the second floor [facility] lab in the the postabsorptive state. He was premedicated with IV Versed and fentanyl. His left groin was prepped and shaved in usual sterile fashion. Xylocaine 1% was used for local anesthesia. A 5 French sheath was inserted into the left common femoral artery using standard Seldinger technique.  A 5 French pigtail catheters placed the distal abdominal aorta.  Distal abdominal aortography, bilateral iliac angiography with bifemoral runoff was performed using bolus chase, digital subtraction and step table technique.  Omnipaque dye was used for the entirety of the case (200 cc of contrast total to patient).  Retrograde with pressures monitored to the case.     Angiographic Data:    1: Abdominal  aorta-the infrarenal abdominal aorta was fluoroscopic calcified but not aneurysmal or atherosclerotic/obstructive. 2: Left lower extremity-the ostial left common iliac artery stent was widely patent.  There was a 30% stenosis in the left common femoral artery.  The mid left SFA stent was widely patent with three-vessel runoff 3: There was a 40% stenosis in the right extrailiac artery with a 30 to 40 mm pullback gradient.  The mid right SFA stent was widely patent.  There was a segmental 90 to 95% calcified (exophytic plaque) segment below the stented segment in the adductor canal.  There is two-vessel runoff with a occluded anterior tibial artery.   IMPRESSION: [name] has right lower extremity claudication.  He has a moderate stenosis in the right extrailiac artery and high-grade segmental calcified mid to distal right SFA stenosis just beyond the stented segment with two-vessel runoff.  Will proceed with orbital atherectomy using a 1.5 mm solid bur and NAV  6 distal protection device.   Procedure Description: Contralateral access was obtained with a 3 5 versa core wire and a 6 French 45 cm catapult sheath.  The patient received 9000's of heparin with an ACT of 415.  I was then able to cross the diseased segment with  a 0.1/6 g shepherd wire.  I exchanged this through a 01 8/135 cm long quick cross endhole catheter for a 0.1/0.17 Viper wire.  I then performed orbital atherectomy up to 120,000 RPM of the diseased segment followed by PTA using a 5 mm x 80 mm balloon and finally DCB using a 5 mm x 120 cm long Impact Admiral Balloon at 6 atm for 3 minutes resulting reduction of a long 90% calcified stenosis in the mid to distal right SFA to less than 20% residual with a small nonflow-limiting dissection.  The patient tolerated procedure well.  He received an additional 300 mg of p.o. clopidogrel.  The Nav 6 distal protection device was then captured and withdrawn from the body.  It was full of "Endo dust".  The  catapult sheath was then exchanged over an 035 versa core wire for a short 6 French sheath which was then secured in place.   final Impression: Successful orbital atherectomy, DCB of a highly calcified segmental mid to distal right SFA stenosis beyond the previously stented segment for lifestyle-limiting claudication.  Patient tolerated procedure well.  The sheath will be removed once ACT falls below 170 and pressure held.  He will be hydrated overnight and discharged home in the morning on DAPT.  Will obtain lower extremity arterial Doppler studies in unrefined office next week and I will see him back 1 to 2 weeks thereafter.  He left the lab in stable condition. _____________   History of Present Illness     [name] is a 62 y.o. male with his girlfriend [name], Caucasian male father of one child seen for peripheral vascular disease because of claudication and diminished ABIs.  I last saw him in the office 11/20/2022.  Risk factors include continued tobacco abuse of one pack -2 pks per day, diabetes and hyperlipidemia. He was catheterized by Dr. Marinus Reed 08/19/2012 demonstrated three-vessel disease. He ultimately underwent coronary artery bypass grafting x4 January 17 for LIMA to his LAD, a vein to the diagonal branch, ramus intermedius branch and to the RCA. He tolerated the procedure well. His EF was 50-55% by 2-D echo. Dopplers revealed ABIs of 0.5 and 0.7. He complaind of left greater than right claudication at one block. He underwent percutaneous revascularization using the Viance CTO catheter, diamondback orbital rotational atherectomy, chocolate balloon and IDEV Stent successfully to the mid Rt SFA. He enjoyed an excellent angiographic and clinical result with improvement Dopplers. He underwent an attempt at percutaneous revascularization of his long segment calcified left SFA CTO for lifestyle limiting claudication.I was unable to cross the lesion with a Viance CTO catheter.the patient presents in  followup. Continues to have left lower extremity claudication. Also continues to smoke. We discussed options including retrograde attempt at history catheterization by Dr. Hoy Finlay in [place] versus femoropopliteal bypass grafting. Since she's had coronary bypass grafting, I suspect he does not have adequate venous conduits left to perform lower shimmy surgical room randomization and what was thought to require PTFE.  I did send him to [facility] where Dr. Hoy Finlay been 6 hours revascularizing a left SFA CTO successfully.  He is continue to smoke a pack a day.  Over the last several months he has noticed increasing left greater than right lower extremity lifestyle of any claudication with recent Doppler studies performed 03/16/2021 revealing a right ABI of 0.80 and a left of 0.74.  He does have a high-frequency signal in his left SFA.  He wishes to proceed with outpatient  endovascular therapy.   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  He underwent orbital atherectomy followed by VBX covered stenting (8 mm x 29 mm).  He was discharged home the following day.  His Dopplers performed 04/03/2021 revealed a widely patent stent.  His claudication resolved after that time.  He does unfortunately continue to smoke.   He has had recurrent claudication symptoms beginning of this year.  He had Dopplers performed 02/20/2022 revealing right ABI of 0.73 and a left ABI of 0.61.  He does have a high-frequency signal in his proximal left SFA with patent SFA stents bilaterally.  He wishes to proceed with outpatient peripheral angiography.   I performed angiography and intervention on him 04/12/2022 revealing patent left common iliac artery stent, patent bilateral SFA stents and a subtotally occluded calcified proximal left left SFA stenosis which I intervened on using chocolate balloon angioplasty followed by DCB.  His  claudication had resolved and his Dopplers have markedly improved.  He does continue to smoke unfortunately.   Since I saw him 3 months ago I had intended perform right lower extremity intervention however this was delayed because of a intracranial coiling procedure.  He ultimately underwent right total hip replacement with Dr. Dion Saucier in had pneumonia as well.  He returns now because of right calf and foot pain and wishes to proceed with outpatient peripheral angiography and endovascular therapy.  He does continue to smoke a pack and a half a day.  Hospital Course     Consultants: N/A   Patient was recently seen by Dr. Nanetta Reed on 03/06/2019 for at which time he complained of claudication symptoms in the right calf and foot.  Recent ABI showed right ABI 0.55, left ABI 0.82.  He ultimately underwent lower extremity angiography on 04/01/2023 that showed 30% disease in left common femoral artery, widely patent mid left SFA stent with 3 vessel runoff, 40% disease in the right extrailiac artery, widely patent right SFA stent, segmental 90 to 95% calcified segment below the stented segment in the adductor canal, two-vessel runoff with occluded anterior tibial artery.  Patient underwent successful orbital arthrectomy and drug-coated balloon angioplasty of the highly calcified segmental mid to distal right SFA lesion beyond the previously stented segment.  Postprocedure, patient recovered well.  He was seen in the following morning by Dr. Carolan Clines at which time he was doing well.  He is deemed stable for discharge from the cardiac perspective.  Cath precaution has been discussed with the patient.  He is aware not to lift anything greater than 5 pounds for 1 week.  He has upcoming lower extremity arterial Doppler and follow-up scheduled.      Did the patient have an acute coronary syndrome (MI, NSTEMI, STEMI, etc) this admission?:  No                               Did the patient have a percutaneous  coronary intervention (stent / angioplasty)?:  No.          _____________  Discharge Vitals Blood pressure 137/63, pulse 76, temperature 97.9 F (36.6 C), temperature source Oral, resp. rate 20, height 5\' 11"  (1.803 m), weight 84.8 kg, SpO2 93%.  Filed Weights   04/01/23 1000  Weight: 84.8 kg    Labs & Radiologic Studies    CBC Recent Labs    04/02/23  0404  WBC 8.1  HGB 13.5  HCT 41.5  MCV 90.8  PLT 161   Basic Metabolic Panel Recent Labs    09/81/19 0404  NA 136  K 3.8  CL 102  CO2 25  GLUCOSE 200*  BUN 10  CREATININE 0.90  CALCIUM 8.6*   Liver Function Tests No results for input(s): "AST", "ALT", "ALKPHOS", "BILITOT", "PROT", "ALBUMIN" in the last 72 hours. No results for input(s): "LIPASE", "AMYLASE" in the last 72 hours. High Sensitivity Troponin:   No results for input(s): "TROPONINIHS" in the last 720 hours.  BNP Invalid input(s): "POCBNP" D-Dimer No results for input(s): "DDIMER" in the last 72 hours. Hemoglobin A1C No results for input(s): "HGBA1C" in the last 72 hours. Fasting Lipid Panel Recent Labs    04/02/23 0404  CHOL 145  HDL 37*  LDLCALC 40  TRIG 147*  CHOLHDL 3.9   Thyroid Function Tests No results for input(s): "TSH", "T4TOTAL", "T3FREE", "THYROIDAB" in the last 72 hours.  Invalid input(s): "FREET3" _____________  PERIPHERAL VASCULAR CATHETERIZATION  Result Date: 04/01/2023 Images from the original result were not included.  [id number] LOCATION:  FACILITY: [facility] PHYSICIAN: [name], M.D. Mar 17, 1961 DATE OF PROCEDURE:  04/01/2023 DATE OF DISCHARGE: PV Angiogram/Intervention History obtained from chart review. [name] is a 61 y.o.    mildly overweight single, though lives with his girlfriend [name], Caucasian male father of one child seen for peripheral vascular disease because of claudication and diminished ABIs.  I last saw him in the office 11/20/2022.  Risk factors include continued tobacco abuse of one pack -2 pks per  day, diabetes and hyperlipidemia. He was catheterized by Dr. Marinus Reed 08/19/2012 demonstrated three-vessel disease. He ultimately underwent coronary artery bypass grafting x4 January 17 for LIMA to his LAD, a vein to the diagonal branch, ramus intermedius branch and to the RCA. He tolerated the procedure well. His EF was 50-55% by 2-D echo. Dopplers revealed ABIs of 0.5 and 0.7. He complaind of left greater than right claudication at one block. He underwent percutaneous revascularization using the Viance CTO catheter, diamondback orbital rotational atherectomy, chocolate balloon and IDEV Stent successfully to the mid Rt SFA. He enjoyed an excellent angiographic and clinical result with improvement Dopplers. He underwent an attempt at percutaneous revascularization of his long segment calcified left SFA CTO for lifestyle limiting claudication.I was unable to cross the lesion with a Viance CTO catheter.the patient presents in followup. Continues to have left lower extremity claudication. Also continues to smoke. We discussed options including retrograde attempt at history catheterization by Dr. Hoy Finlay in [place] versus femoropopliteal bypass grafting. Since she's had coronary bypass grafting, I suspect he does not have adequate venous conduits left to perform lower shimmy surgical room randomization and what was thought to require PTFE. I did send him to [facility] where Dr. Hoy Finlay been 6 hours revascularizing a left SFA CTO successfully.  He is continue to smoke a pack a day.  Over the last several months he has noticed increasing left greater than right lower extremity lifestyle of any claudication with recent Doppler studies performed 03/16/2021 revealing a right ABI of 0.80 and a left of 0.74.  He does have a high-frequency signal in his left SFA.  He wishes to proceed with outpatient endovascular therapy.  I performed peripheral angiography on him 03/30/2021 revealing patent SFA stents with a high-grade  calcified left common iliac artery stenosis.  He underwent orbital atherectomy followed by VBX covered stenting (8 mm x 29 mm).  He was discharged home the following day.  His Dopplers performed 04/03/2021 revealed a widely patent stent.  His claudication resolved after that time.  He does unfortunately continue to smoke.  He has had recurrent claudication symptoms beginning of this year.  He had Dopplers performed 02/20/2022 revealing right ABI of 0.73 and a left ABI of 0.61.  He does have a high-frequency signal in his proximal left SFA with patent SFA stents bilaterally.  He wishes to proceed with outpatient peripheral angiography.  I performed angiography and intervention on him 04/12/2022 revealing patent left common iliac artery stent, patent bilateral SFA stents and a subtotally occluded calcified proximal left left SFA stenosis which I intervened on using chocolate balloon angioplasty followed by DCB.  His claudication had resolved and his Dopplers have markedly improved.  He does continue to smoke unfortunately.  Since I saw him 3 months ago I had intended perform right lower extremity intervention however this was delayed because of a intracranial coiling procedure.  He ultimately underwent right total hip replacement with Dr. Dion Saucier in had pneumonia as well.  He returns now because of right calf and foot pain and wishes to proceed with outpatient peripheral angiography and endovascular therapy.  He does continue to smoke a pack and a half a day. Pre Procedure Diagnosis: Peripheral arterial disease Post Procedure Diagnosis: Peripheral arterial disease Operators: Dr. Nanetta Reed Procedures Performed:  1.  Left common femoral access  2.  Abdominal aortogram/bilateral iliac angiogram/bifemoral runoff  3.  Contralateral access (second-order catheter placement)  4.  Placement of a Nav 6 distal protection device in the right below the knee popliteal artery             5.  Orbital atherectomy right SFA              6.  DCB right SFA PROCEDURE DESCRIPTION: The patient was brought to the second floor [facility] lab in the the postabsorptive state. He was premedicated with IV Versed and fentanyl. His left groin was prepped and shaved in usual sterile fashion. Xylocaine 1% was used for local anesthesia. A 5 French sheath was inserted into the left common femoral artery using standard Seldinger technique.  A 5 French pigtail catheters placed the distal abdominal aorta.  Distal abdominal aortography, bilateral iliac angiography with bifemoral runoff was performed using bolus chase, digital subtraction and step table technique.  Omnipaque dye was used for the entirety of the case (200 cc of contrast total to patient).  Retrograde with pressures monitored to the case.  Angiographic Data: 1: Abdominal aorta-the infrarenal abdominal aorta was fluoroscopic calcified but not aneurysmal or atherosclerotic/obstructive. 2: Left lower extremity-the ostial left common iliac artery stent was widely patent.  There was a 30% stenosis in the left common femoral artery.  The mid left SFA stent was widely patent with three-vessel runoff 3: There was a 40% stenosis in the right extrailiac artery with a 30 to 40 mm pullback gradient.  The mid right SFA stent was widely patent.  There was a segmental 90 to 95% calcified (exophytic plaque) segment below the stented segment in the adductor canal.  There is two-vessel runoff with a occluded anterior tibial artery.   [name] has right lower extremity claudication.  He has a moderate stenosis in the right extrailiac artery and high-grade segmental calcified mid to distal right SFA stenosis just beyond the stented segment with two-vessel runoff.  Will proceed with orbital atherectomy using a 1.5 mm solid bur  and NAV  6 distal protection device. Procedure Description: Contralateral access was obtained with a 3 5 versa core wire and a 6 French 45 cm catapult sheath.  The patient received  9000's of heparin with an ACT of 415.  I was then able to cross the diseased segment with a 0.1/6 g shepherd wire.  I exchanged this through a 01 8/135 cm long quick cross endhole catheter for a 0.1/0.17 Viper wire.  I then performed orbital atherectomy up to 120,000 RPM of the diseased segment followed by PTA using a 5 mm x 80 mm balloon and finally DCB using a 5 mm x 120 cm long Impact Admiral Balloon at 6 atm for 3 minutes resulting reduction of a long 90% calcified stenosis in the mid to distal right SFA to less than 20% residual with a small nonflow-limiting dissection.  The patient tolerated procedure well.  He received an additional 300 mg of p.o. clopidogrel.  The Nav 6 distal protection device was then captured and withdrawn from the body.  It was full of "Endo dust".  The catapult sheath was then exchanged over an 035 versa core wire for a short 6 French sheath which was then secured in place. final Impression: Successful orbital atherectomy, DCB of a highly calcified segmental mid to distal right SFA stenosis beyond the previously stented segment for lifestyle-limiting claudication.  Patient tolerated procedure well.  The sheath will be removed once ACT falls below 170 and pressure held.  He will be hydrated overnight and discharged home in the morning on DAPT.  Will obtain lower extremity arterial Doppler studies in unrefined office next week and I will see him back 1 to 2 weeks thereafter.  He left the lab in stable condition. [name]. MD, [facility] 04/01/2023 3:21 PM    Disposition   Pt is being discharged home today in good condition.  Follow-up Plans & Appointments     Follow-up Information     [facility] at [facility] Follow up on 04/15/2023.   Specialty: Cardiology Why: @ 8:30AM. Post procedural ultrasound. Contact information: [address] [place] [address] [phone]        Runell Gess, MD Follow up on 04/24/2023.    Specialties: Cardiology, Radiology Why: @ 9:30AM. Post procedure follow up Contact information: [address] [place] [address] [phone]                   Discharge Medications   Allergies as of 04/02/2023   No Known Allergies      Medication List     TAKE these medications    acetaminophen 650 MG CR tablet Commonly known as: TYLENOL Take 1,300 mg by mouth daily as needed for pain.   albuterol 108 (90 Base) MCG/ACT inhaler Commonly known as: VENTOLIN HFA Inhale 2 puffs into the lungs every 6 (six) hours as needed for wheezing or shortness of breath.   aspirin EC 81 MG tablet Take 1 tablet (81 mg total) by mouth daily.   clopidogrel 75 MG tablet Commonly known as: PLAVIX Take 1 tablet (75 mg total) by mouth daily with breakfast.   cyclobenzaprine 10 MG tablet Commonly known as: FLEXERIL Take 1 tablet (10 mg total) by mouth every 8 (eight) hours as needed for muscle spasms.   empagliflozin 25 MG Tabs tablet Commonly known as: JARDIANCE Take 25 mg by mouth daily.   folic acid 1 MG tablet Commonly known as: FOLVITE Take 1 tablet (  1 mg total) by mouth daily.   gabapentin 600 MG tablet Commonly known as: NEURONTIN Take 600 mg by mouth 3 (three) times daily.   glipiZIDE 10 MG 24 hr tablet Commonly known as: GLUCOTROL XL Take 10 mg by mouth 2 (two) times daily.   icosapent Ethyl 1 g capsule Commonly known as: VASCEPA Take 2 g by mouth 2 (two) times daily.   isosorbide mononitrate 60 MG 24 hr tablet Commonly known as: IMDUR Take 1 tablet (60 mg total) by mouth daily.   losartan 50 MG tablet Commonly known as: COZAAR Take 50 mg by mouth daily.   metoprolol succinate 25 MG 24 hr tablet Commonly known as: TOPROL-XL TAKE 1 TABLET BY MOUTH ONCE A DAY.   ondansetron 4 MG tablet Commonly known as: Zofran Take 1 tablet (4 mg total) by mouth every 8 (eight) hours as needed for nausea or vomiting.   oxyCODONE-acetaminophen  10-325 MG tablet Commonly known as: PERCOCET Take 1 tablet by mouth 4 (four) times daily as needed for pain.   rosuvastatin 40 MG tablet Commonly known as: CRESTOR Take 1 tablet (40 mg total) by mouth daily.   thiamine 100 MG tablet Commonly known as: Vitamin B-1 Take 1 tablet (100 mg total) by mouth daily.   VITAMIN D-3 PO Take 1 capsule by mouth daily.   zolpidem 12.5 MG CR tablet Commonly known as: AMBIEN CR Take 12.5 mg by mouth at bedtime.           Outstanding Labs/Studies   N/A  Duration of Discharge Encounter   Greater than 30 minutes including physician time.  Ramond Dial, PA 04/02/2023, 10:59 AM

## 2023-04-02 NOTE — Plan of Care (Signed)
[name] has hx of CABG, bilateral SFA intervention and left common iliac artery intervention,  presented 2/2 persistent claudication Doppler studies performed 11/05/2022 revealed a right ABI of 0.55 and a left of 0.82. His left iliac stent was patent. He had potentially an occlusion of his right popliteal artery.  He is underwent placement of a Nav 6 distal protection device in the right below the knee popliteal artery, orbital atherectomy right SFA. He feels well this AM. No issues. Renal function is normal. Hgb is normal. His groin site is clean and dry.  Plan: continue CAD/PAD GDMT. Blood pressure is well controlled. No changes in his regimen. See DC summary.  Vitals:   04/02/23 0451 04/02/23 0825  BP: (!) 104/56 (!) 110/52  Pulse:  76  Resp: 18 20  Temp: 97.7 F (36.5 C) 97.9 F (36.6 C)  SpO2:  93%   Physical Exam Gen: well appearing Neuro: alert and oriented CV: r,r,r no murmurs. No JVD Pulm: nl wob, CLAB Vasc: right femoral site clean and dry Abd: non distended Ext: No LE edema Skin: warm and well perfused Psych: normal mood

## 2023-04-03 LAB — POCT ACTIVATED CLOTTING TIME
Activated Clotting Time: 305 s
Activated Clotting Time: 372 s
Activated Clotting Time: 415 s
Activated Clotting Time: 244 s

## 2023-04-15 ENCOUNTER — Inpatient Hospital Stay (HOSPITAL_COMMUNITY): Admission: RE | Admit: 2023-04-15 | Payer: 59 | Source: Ambulatory Visit

## 2023-04-15 ENCOUNTER — Ambulatory Visit (HOSPITAL_COMMUNITY)
Admission: RE | Admit: 2023-04-15 | Discharge: 2023-04-15 | Disposition: A | Discharge status: Home / Self Care | Payer: 59 | Source: Ambulatory Visit | Attending: Cardiovascular Disease | Admitting: Cardiovascular Disease

## 2023-04-15 ENCOUNTER — Ambulatory Visit (HOSPITAL_BASED_OUTPATIENT_CLINIC_OR_DEPARTMENT_OTHER)
Admission: RE | Admit: 2023-04-15 | Discharge: 2023-04-15 | Disposition: A | Discharge status: Home / Self Care | Payer: 59 | Source: Ambulatory Visit | Attending: Cardiovascular Disease | Admitting: Cardiovascular Disease

## 2023-04-15 DIAGNOSIS — I10 Essential (primary) hypertension: Secondary | ICD-10-CM | POA: Diagnosis not present

## 2023-04-15 DIAGNOSIS — Z951 Presence of aortocoronary bypass graft: Secondary | ICD-10-CM

## 2023-04-15 DIAGNOSIS — Z720 Tobacco use: Secondary | ICD-10-CM

## 2023-04-15 DIAGNOSIS — I739 Peripheral vascular disease, unspecified: Secondary | ICD-10-CM | POA: Diagnosis not present

## 2023-04-15 DIAGNOSIS — Z95820 Peripheral vascular angioplasty status with implants and grafts: Secondary | ICD-10-CM

## 2023-04-15 LAB — VAS US ABI WITH/WO TBI
Left ABI: 0.91
Right ABI: 0.66

## 2023-04-24 ENCOUNTER — Encounter: Payer: Self-pay | Admitting: Cardiovascular Disease

## 2023-04-24 ENCOUNTER — Ambulatory Visit: Payer: 59 | Attending: Cardiovascular Disease | Admitting: Cardiovascular Disease

## 2023-04-24 VITALS — BP 128/74 | HR 75 | Ht 71.0 in | Wt 187.8 lb

## 2023-04-24 DIAGNOSIS — M25571 Pain in right ankle and joints of right foot: Secondary | ICD-10-CM | POA: Diagnosis not present

## 2023-04-24 DIAGNOSIS — I739 Peripheral vascular disease, unspecified: Secondary | ICD-10-CM

## 2023-04-24 DIAGNOSIS — M545 Low back pain, unspecified: Secondary | ICD-10-CM | POA: Diagnosis not present

## 2023-04-24 NOTE — Progress Notes (Signed)
[name] returns today for follow-up of his right lower extremity revascularization procedure performed 04/01/2023.  He has had prior stenting of his right SFA by myself remotely.  He has chronic pain in his right foot which now in retrospect may be related to diabetic peripheral neuropathy.  I performed angiography on him revealing a 40% right extrailiac artery stenosis with a 30 mm gradient, patent right SFA stent with 90% segmental calcified exophytic plaque beyond this and two-vessel runoff.  Anterior tibial is occluded.  He underwent orbital atherectomy and drug-coated balloon angioplasty with an excellent result and was discharged home the following day.  His ABI however only increased from 0.55 on the right to 0.66.  The original symptoms prior to stent implantation was calf claudication which he no longer has.  At this point, I do not feel compelled to address the right extrailiac artery.  Will get lower extremity arterial Doppler studies in 6 months and I will see him back after that for follow-up.  Unfortunately, [name] does continue to smoke.   Runell Gess, M.D., FACP, [facility], [facility] [address] [place], [place]  [address]  [phone] 04/24/2023 9:47 AM

## 2023-04-24 NOTE — Patient Instructions (Signed)
Medication Instructions:  Your physician recommends that you continue on your current medications as directed. Please refer to the Current Medication list given to you today.  *If you need a refill on your cardiac medications before your next appointment, please call your pharmacy*   Testing/Procedures: Your physician has requested that you have an Aorta/Iliac Duplex. This will be take place at [address] [facility], [address].  No food after 11PM the night before.  Water is OK. (Don't drink liquids if you have been instructed not to for ANOTHER test) Avoid foods that produce bowel gas, for 24 hours prior to exam (see below). No breakfast, no chewing gum, no smoking or carbonated beverages. Patient may take morning medications with water. Come in for test at least 15 minutes early to register. To do in March 2025   Your physician has requested that you have a lower extremity arterial duplex. During this test, ultrasound is used to evaluate arterial blood flow in the legs. Allow one hour for this exam. There are no restrictions or special instructions. This will take place at [address] [facility], [address]. To do in March 2025   Your physician has requested that you have an ankle brachial index (ABI). During this test an ultrasound and blood pressure cuff are used to evaluate the arteries that supply the arms and legs with blood. Allow thirty minutes for this exam. There are no restrictions or special instructions. This will take place at [address] [facility], [address]. To do in March 2025    Follow-Up: At [facility], you and your health needs are our priority.  As part of our continuing mission to provide you with exceptional heart care, we have created designated Provider Care Teams.  These Care Teams include your primary Cardiologist (physician) and Advanced Practice Providers (APPs -  Physician Assistants and Nurse Practitioners) who all work together to provide you with the care  you need, when you need it.  We recommend signing up for the patient portal called "MyChart".  Sign up information is provided on this After Visit Summary.  MyChart is used to connect with patients for Virtual Visits (Telemedicine).  Patients are able to view lab/test results, encounter notes, upcoming appointments, etc.  Non-urgent messages can be sent to your provider as well.   To learn more about what you can do with MyChart, go to [email or web address].    Your next appointment:   6 month(s)  Provider:   Nanetta Batty, MD

## 2023-05-05 ENCOUNTER — Other Ambulatory Visit: Payer: Self-pay | Admitting: Internal Medicine

## 2023-05-10 ENCOUNTER — Ambulatory Visit (INDEPENDENT_AMBULATORY_CARE_PROVIDER_SITE_OTHER): Payer: 59 | Admitting: Podiatry

## 2023-05-10 ENCOUNTER — Ambulatory Visit (INDEPENDENT_AMBULATORY_CARE_PROVIDER_SITE_OTHER): Payer: 59

## 2023-05-10 DIAGNOSIS — I739 Peripheral vascular disease, unspecified: Secondary | ICD-10-CM

## 2023-05-10 DIAGNOSIS — R2681 Unsteadiness on feet: Secondary | ICD-10-CM | POA: Diagnosis not present

## 2023-05-10 DIAGNOSIS — M778 Other enthesopathies, not elsewhere classified: Secondary | ICD-10-CM | POA: Diagnosis not present

## 2023-05-10 DIAGNOSIS — M79671 Pain in right foot: Secondary | ICD-10-CM

## 2023-05-10 DIAGNOSIS — G629 Polyneuropathy, unspecified: Secondary | ICD-10-CM

## 2023-05-10 DIAGNOSIS — G8929 Other chronic pain: Secondary | ICD-10-CM | POA: Diagnosis not present

## 2023-05-10 NOTE — Progress Notes (Unsigned)
Subjective:   Patient ID: [name], male   DOB: 62 y.o.   MRN: [id number]   HPI No chief complaint on file.  62 year old male with a history of diabetes, neuropathy, and vascular disease, presents with worsening neuropathy in the right foot. The neuropathy has been ongoing for years, but has recently worsened, with the patient reporting that the right foot will "give out" causing them to fall. The patient also reports that the neuropathy is starting to progress up the right leg. The patient underwent a total hip replacement in May and had a stent placed in the leg due to a blockage in Shaun. Despite the stent placement, the patient reports no improvement in the neuropathy symptoms. The patient is currently on gabapentin 600mg  three to four times a day for the neuropathy, but reports that it is not providing sufficient relief. The patient denies any wounds on the feet. The patient also has a history of nerve damage in the left hand, which causes them to drop things. The patient's diabetes is well-controlled, with a recent A1c of 6.9, down from 9.7.  ABI 04/15/2023 Summary:  Right: Resting right ankle-brachial index indicates moderate right lower  extremity arterial disease. The right toe-brachial index is abnormal.   Left: Resting left ankle-brachial index indicates mild left lower  extremity arterial disease. The left toe-brachial index is abnormal.   He does follow with Dr. Allyson Sabal for PAD   Review of Systems  All other systems reviewed and are negative.   Past Medical History:  Diagnosis Date   Arthritis    "some; in hips sometimes" (01/02/2013)   Back pain    "w/prolonged walking or standing" (01/02/2013)   CAD (coronary artery disease)    CABG X 4 08/2012   COPD (chronic obstructive pulmonary disease) (HCC)    Elevated PSA    Esophageal erosions 05/24/2001   egd by Dr. Jena Gauss   GERD (gastroesophageal reflux disease)    H/O hiatal hernia    Headache    History of gout     Hyperlipidemia    Hypertension    not on any medication for htn at present   PAD (peripheral artery disease) (HCC)    LEA DOPPLER, 11/11/2012 - moderate arterial insufficiency to lower extremities at rest, BILATERAL SFA-demonstrates occlusive disease with reconstitution of flow   S/P percutaneous transluminal angioplasty (PTA) with stent placement 03/29/21 03/31/2021   Tobacco abuse    Tubular adenoma of colon 08/28/2011   Type II diabetes mellitus (HCC)    Unintentional weight loss     Past Surgical History:  Procedure Laterality Date   ABDOMINAL AORTOGRAM W/LOWER EXTREMITY N/A 03/30/2021   Procedure: ABDOMINAL AORTOGRAM W/LOWER EXTREMITY;  Surgeon: Runell Gess, MD;  [facility]: [facility] IN[facility] LAB;  Service: Cardiovascular;  Laterality: N/A;   ABDOMINAL AORTOGRAM W/LOWER EXTREMITY N/A 04/12/2022   Procedure: ABDOMINAL AORTOGRAM W/LOWER EXTREMITY;  Surgeon: Runell Gess, MD;  Location: [facility]AS[facility] CV [facility];  Service: Cardiovascular;  Laterality: N/A;   ABDOMINAL AORTOGRAM W/LOWER EXTREMITY N/A 04/01/2023   Procedure: ABDOMINAL AORTOGRAM W/LOWER EXTREMITY;  Surgeon: Runell Gess, MD;  Location: [facility];  Service: Cardiovascular;  Laterality: N/A;   ANTERIOR CERVICAL DECOMP/DISCECTOMY FUSION  12/19/2011   Procedure: ANTERIOR CERVICAL DECOMPRESSION/DISCECTOMY FUSION 2 LEVELS;  Surgeon: Karn Cassis, MD;  Location: [facility] NEURO ORS;  Service: Neurosurgery;  Laterality: N/A;  Cervical four-five Cervical five-six  Anterior cervical decompression/diskectomy, fusion, plate   ATHERECTOMY N/A 01/02/2013   Procedure: ATHERECTOMY;  Surgeon: Runell Gess, MD;  Location: [facility] CATH LAB;  Service: Cardiovascular;  Laterality: N/A;   BACK SURGERY     x3   CARDIAC CATHETERIZATION  08/19/2012   CABG warranted; Severe ostial LCx stenosis with mid to distal vessel occlusion   COLONOSCOPY W/ POLYPECTOMY  08/28/2011   Rourk-tubular adenomas removed from ascending colon, suboptimal prep,  diminutive rectal polyps   COLONOSCOPY WITH PROPOFOL N/A 06/13/2017   Procedure: COLONOSCOPY WITH PROPOFOL;  Surgeon: Corbin Ade, MD;  [facility]: [facility];  Service: Endoscopy;  Laterality: N/A;  9:15am   CORONARY ARTERY BYPASS GRAFT  08/22/2012   Procedure: CORONARY ARTERY BYPASS GRAFTING (CABG);  Surgeon: Delight Ovens, MD;  Location: [facility] OR;  Service: Open Heart Surgery;  Laterality: N/A;  CABG x four,  using left internal mammary artery and right leg greater saphenous vein harvested endoscopically   ESOPHAGOGASTRODUODENOSCOPY  05/24/2001   by Dr. Jena Gauss   ESOPHAGOGASTRODUODENOSCOPY  08/28/2011   Rourk-erosive reflux esophagitis, gastric erosions   ESOPHAGOGASTRODUODENOSCOPY (EGD) WITH PROPOFOL N/A 06/13/2017   Procedure: ESOPHAGOGASTRODUODENOSCOPY (EGD) WITH PROPOFOL;  Surgeon: Corbin Ade, MD;  [facility];  Service: Endoscopy;  Laterality: N/A;   INTRAOPERATIVE TRANSESOPHAGEAL ECHOCARDIOGRAM  08/22/2012   Procedure: INTRAOPERATIVE TRANSESOPHAGEAL ECHOCARDIOGRAM;  Surgeon: Delight Ovens, MD;  Location: [facility] OR;  Service: Open Heart Surgery;  Laterality: N/A;   IR 3D INDEPENDENT WKST  05/17/2022   IR ANGIO INTRA EXTRACRAN SEL INTERNAL CAROTID UNI R MOD SED  05/17/2022   IR ANGIO INTRA EXTRACRAN SEL INTERNAL CAROTID UNI R MOD SED  12/04/2022   IR ANGIOGRAM FOLLOW UP STUDY  05/17/2022   IR NEURO EACH ADD'L AFTER BASIC UNI RIGHT (MS)  05/17/2022   IR TRANSCATH/EMBOLIZ  05/17/2022   IR US GUIDE VASC ACCESS RIGHT  05/17/2022   IR US GUIDE VASC ACCESS RIGHT  12/04/2022   LEFT HEART CATHETERIZATION WITH CORONARY ANGIOGRAM N/A 08/19/2012   Procedure: LEFT HEART CATHETERIZATION WITH CORONARY ANGIOGRAM;  Surgeon: Chrystie Nose, MD;  Location: [facility] CATH LAB;  Service: Cardiovascular;  Laterality: N/A;   LOWER EXTREMITY ANGIOGRAM N/A 11/17/2012   Procedure: LOWER EXTREMITY ANGIOGRAM;  Surgeon: Runell Gess, MD;  Location: [facility] CATH LAB;  Service: Cardiovascular;   Laterality: N/A;   LOWER EXTREMITY ANGIOGRAM N/A 02/02/2013   Procedure: LOWER EXTREMITY ANGIOGRAM;  Surgeon: Runell Gess, MD;  Location: [facility] CATH LAB;  Service: Cardiovascular;  Laterality: N/A;   LUMBAR DISC SURGERY     LUMBAR FUSION  ~ 2011   PELVIC ABCESS DRAINAGE     x2   PERIPHERAL VASCULAR ATHERECTOMY  03/30/2021   Procedure: PERIPHERAL VASCULAR ATHERECTOMY;  Surgeon: Runell Gess, MD;  Location: [facility] INVASIVE CV LAB;  Service: Cardiovascular;;  left common iliac artery   PERIPHERAL VASCULAR ATHERECTOMY  04/01/2023   Procedure: PERIPHERAL VASCULAR ATHERECTOMY;  Surgeon: Runell Gess, MD;  Location: [facility] INVASIVE CV LAB;  Service: Cardiovascular;;   PERIPHERAL VASCULAR BALLOON ANGIOPLASTY Left 04/12/2022   Procedure: PERIPHERAL VASCULAR BALLOON ANGIOPLASTY;  Surgeon: Runell Gess, MD;  [facility];  Service: Cardiovascular;  Laterality: Left;  SFA   PERIPHERAL VASCULAR BALLOON ANGIOPLASTY Right 04/01/2023   Procedure: PERIPHERAL VASCULAR BALLOON ANGIOPLASTY;  Surgeon: Runell Gess, MD;  [facility]: [facility];  Service: Cardiovascular;  Laterality: Right;  DCB - SFA   PERIPHERAL VASCULAR INTERVENTION  03/30/2021   Procedure: PERIPHERAL VASCULAR INTERVENTION;  Surgeon: Runell Gess, MD;  Location: [facility];  Service: Cardiovascular;;   POLYPECTOMY  06/13/2017   Procedure: POLYPECTOMY;  Surgeon: Corbin Ade, MD;  [facility];  Service: Endoscopy;;  colon   RADIOLOGY WITH ANESTHESIA N/A 05/17/2022   Procedure: Diagnostic Cerebral Angiogram, Possible Aneurysm Coiling, Possible Stent;  Surgeon: Lisbeth Renshaw, MD;  Location: [facility] OR;  Service: Radiology;  Laterality: N/A;   SHOULDER SURGERY Right    TOTAL HIP ARTHROPLASTY Right 12/25/2022   Procedure: TOTAL HIP ARTHROPLASTY;  Surgeon: Teryl Lucy, MD;  Location: [facility] ORS;  Service: Orthopedics;  Laterality: Right;   TOTAL HIP ARTHROPLASTY Right    VASCULAR SURGERY  Right 01/02/2013   diamondback orbital rotational  atherectomy, chocolate balloon and IDEV  Stent.     Current Outpatient Medications:    acetaminophen (TYLENOL) 650 MG CR tablet, Take 1,300 mg by mouth daily as needed for pain., Disp: , Rfl:    albuterol (VENTOLIN HFA) 108 (90 Base) MCG/ACT inhaler, Inhale 2 puffs into the lungs every 6 (six) hours as needed for wheezing or shortness of breath., Disp: , Rfl:    aspirin EC 81 MG EC tablet, Take 1 tablet (81 mg total) by mouth daily., Disp: , Rfl:    Cholecalciferol (VITAMIN D-3 PO), Take 1 capsule by mouth daily., Disp: , Rfl:    clopidogrel (PLAVIX) 75 MG tablet, Take 1 tablet (75 mg total) by mouth daily with breakfast., Disp: 30 tablet, Rfl: 11   cyclobenzaprine (FLEXERIL) 10 MG tablet, Take 1 tablet (10 mg total) by mouth every 8 (eight) hours as needed for muscle spasms., Disp: , Rfl:    empagliflozin (JARDIANCE) 25 MG TABS tablet, Take 25 mg by mouth daily., Disp: , Rfl:    folic acid (FOLVITE) 1 MG tablet, Take 1 tablet (1 mg total) by mouth daily., Disp: 30 tablet, Rfl: 1   gabapentin (NEURONTIN) 600 MG tablet, Take 600 mg by mouth 3 (three) times daily., Disp: , Rfl:    glipiZIDE (GLUCOTROL XL) 10 MG 24 hr tablet, Take 10 mg by mouth 2 (two) times daily., Disp: , Rfl:    icosapent Ethyl (VASCEPA) 1 g capsule, Take 2 g by mouth 2 (two) times daily., Disp: , Rfl:    isosorbide mononitrate (IMDUR) 60 MG 24 hr tablet, Take 1 tablet (60 mg total) by mouth daily., Disp: 30 tablet, Rfl: 2   losartan (COZAAR) 50 MG tablet, Take 50 mg by mouth daily., Disp: , Rfl:    metoprolol succinate (TOPROL-XL) 25 MG 24 hr tablet, TAKE 1 TABLET BY MOUTH ONCE A DAY., Disp: 90 tablet, Rfl: 3   ondansetron (ZOFRAN) 4 MG tablet, Take 1 tablet (4 mg total) by mouth every 8 (eight) hours as needed for nausea or vomiting., Disp: 10 tablet, Rfl: 0   oxyCODONE-acetaminophen (PERCOCET) 10-325 MG tablet, Take 1 tablet by mouth 4 (four) times daily as needed for pain.,  Disp: , Rfl:    rosuvastatin (CRESTOR) 40 MG tablet, Take 1 tablet (40 mg total) by mouth daily., Disp: 30 tablet, Rfl: 1   thiamine (VITAMIN B-1) 100 MG tablet, Take 1 tablet (100 mg total) by mouth daily., Disp: 30 tablet, Rfl: 2   zolpidem (AMBIEN CR) 12.5 MG CR tablet, Take 12.5 mg by mouth at bedtime., Disp: , Rfl:   No Known Allergies        Objective:  Physical Exam  General: AAO x3, NAD  Dermatological: Dry skin present at the tip of the hallux without any underlying ulceration drainage or signs of infection.  There is  no open lesions.  Vascular: Pulses decreased on the right compared to the left.  The feet appear to be of equal temperature gradient.  Neruologic: Sensation decreased with Semmes Weinstein monofilament bilaterally right side worse than left.  Musculoskeletal: Unable to appreciate any area pinpoint tenderness.  Gait: Unassisted, Nonantalgic.       Assessment:   63 year old male with chronic lower extremity pain     Plan:  Right Lower Extremity Neuropathy Chronic neuropathy, worse on the right side, with recent worsening of symptoms. Gabapentin 600mg  3-4 times daily not providing relief. Noted discrepancy in circulation between the two feet, despite recent stenting in the right leg. -Refer to interventional pain management for further evaluation and management. -Consider nerve conduction study if recommended by pain management. -Refer to physical therapy for strengthening exercises.  Referral sent to [facility] in [place].   Diabetes Mellitus Well-controlled with recent A1c of 6.9. -Continue current management.  Radiology: X-rays obtained reviewed of the right foot.  3 views were obtained.  No evidence of acute fracture.  Calcaneal spurring is present.  Follow-up in 3 months or sooner if needed.    Vivi Barrack DPM

## 2023-05-16 ENCOUNTER — Encounter (HOSPITAL_COMMUNITY): Payer: 59

## 2023-05-20 ENCOUNTER — Other Ambulatory Visit: Payer: Self-pay | Admitting: Podiatry

## 2023-05-20 ENCOUNTER — Telehealth: Payer: Self-pay | Admitting: Podiatry

## 2023-05-20 DIAGNOSIS — G629 Polyneuropathy, unspecified: Secondary | ICD-10-CM

## 2023-05-20 DIAGNOSIS — G8929 Other chronic pain: Secondary | ICD-10-CM

## 2023-05-20 NOTE — Telephone Encounter (Signed)
Pt called and was to have been referred to pain management and has not heard anything. He is having a lot of pain and it is giving out on him and causing him to fall. HE did call get a call from PT but declined currently until he goes to pain management. He stated he wants to figure out what is wrong with his foot from pain management.  Per pt How would PT know what to work on if they do not know what is wrong with his foot. Please advise

## 2023-05-21 ENCOUNTER — Telehealth: Payer: Self-pay

## 2023-05-21 NOTE — Telephone Encounter (Signed)
Spoke to patient and confirmed - he spoke with [facility] and they are reviewing his case. Pain asked for the location and I gave him the address: [phone]

## 2023-05-21 NOTE — Telephone Encounter (Signed)
Lvm for pt that Dr Ardelle Anton has sent a new referral to [facility] and I did leave the number for him to call them if he does not hear from them( [phone]). Also told pt he should follow up with the appt for help with his gait, strengthening and balance issues. Asked pt to call if any further questions.

## 2023-05-23 DIAGNOSIS — H6121 Impacted cerumen, right ear: Secondary | ICD-10-CM | POA: Diagnosis not present

## 2023-05-23 DIAGNOSIS — H9011 Conductive hearing loss, unilateral, right ear, with unrestricted hearing on the contralateral side: Secondary | ICD-10-CM | POA: Diagnosis not present

## 2023-06-03 ENCOUNTER — Telehealth: Payer: Self-pay | Admitting: Emergency Medicine

## 2023-06-03 DIAGNOSIS — E1165 Type 2 diabetes mellitus with hyperglycemia: Secondary | ICD-10-CM | POA: Diagnosis not present

## 2023-06-03 DIAGNOSIS — G4701 Insomnia due to medical condition: Secondary | ICD-10-CM | POA: Diagnosis not present

## 2023-06-03 DIAGNOSIS — I739 Peripheral vascular disease, unspecified: Secondary | ICD-10-CM | POA: Diagnosis not present

## 2023-06-03 DIAGNOSIS — M48062 Spinal stenosis, lumbar region with neurogenic claudication: Secondary | ICD-10-CM | POA: Diagnosis not present

## 2023-06-03 DIAGNOSIS — Z23 Encounter for immunization: Secondary | ICD-10-CM | POA: Diagnosis not present

## 2023-06-03 NOTE — Telephone Encounter (Signed)
[name], np with [facility] . Stent placement 04/15/23; Dr Allyson Sabal patient- here with ongoing severe right foot pain and redness. Needing to speak with DOD- pt in office.

## 2023-06-05 DIAGNOSIS — E119 Type 2 diabetes mellitus without complications: Secondary | ICD-10-CM | POA: Diagnosis not present

## 2023-06-05 DIAGNOSIS — Z7984 Long term (current) use of oral hypoglycemic drugs: Secondary | ICD-10-CM | POA: Diagnosis not present

## 2023-06-05 DIAGNOSIS — H2513 Age-related nuclear cataract, bilateral: Secondary | ICD-10-CM | POA: Diagnosis not present

## 2023-06-10 NOTE — Progress Notes (Unsigned)
Patient name: [name] MRN: [id number] DOB: Sep 13, 1960 Sex: male  REASON FOR CONSULT: PAD, right foot pain  HPI: [name] is a 63 y.o. male, with history of coronary artery disease status post CABG, COPD, hypertension, hyperlipidemia, diabetes, tobacco abuse that presents for evaluation of peripheral arterial disease.  Patient states his right foot hurts constantly.  Has been hurting for 3 months.  States it is painful and numb all the time.  States this started after his hip surgery about 3 months ago.  He has been under the care of Dr. Allyson Sabal.  In review of the notes he previously had a left SFA CTO treated by Hoy Finlay.  On 03/30/2021 he had a orbital atherectomy and VBX stenting of his left common iliac artery for stenosis by Dr. Allyson Sabal..  Most recently had a right SFA orbital atherectomy with Dr. Allyson Sabal on 04/01/2023 and his ABIs improved from 0.55 to 0.66.  He has had prior right SFA intervention in the past prior to his most recent SFA intervention.  Recent duplex suggests some right common femoral and proximal SFA disease as well as right external iliac disease.  Past Medical History:  Diagnosis Date   Arthritis    "some; in hips sometimes" (01/02/2013)   Back pain    "w/prolonged walking or standing" (01/02/2013)   CAD (coronary artery disease)    CABG X 4 08/2012   COPD (chronic obstructive pulmonary disease) (HCC)    Elevated PSA    Esophageal erosions 05/24/2001   egd by Dr. Jena Gauss   GERD (gastroesophageal reflux disease)    H/O hiatal hernia    Headache    History of gout    Hyperlipidemia    Hypertension    not on any medication for htn at present   PAD (peripheral artery disease) (HCC)    LEA DOPPLER, 11/11/2012 - moderate arterial insufficiency to lower extremities at rest, BILATERAL SFA-demonstrates occlusive disease with reconstitution of flow   S/P percutaneous transluminal angioplasty (PTA) with stent placement 03/29/21 03/31/2021   Tobacco abuse    Tubular  adenoma of colon 08/28/2011   Type II diabetes mellitus (HCC)    Unintentional weight loss     Past Surgical History:  Procedure Laterality Date   ABDOMINAL AORTOGRAM W/LOWER EXTREMITY N/A 03/30/2021   Procedure: ABDOMINAL AORTOGRAM W/LOWER EXTREMITY;  Surgeon: Runell Gess, MD;  Location: [facility];  Service: Cardiovascular;  Laterality: N/A;   ABDOMINAL AORTOGRAM W/LOWER EXTREMITY N/A 04/12/2022   Procedure: ABDOMINAL AORTOGRAM W/LOWER EXTREMITY;  Surgeon: Runell Gess, MD;  Location: [facility];  Service: Cardiovascular;  Laterality: N/A;   ABDOMINAL AORTOGRAM W/LOWER EXTREMITY N/A 04/01/2023   Procedure: ABDOMINAL AORTOGRAM W/LOWER EXTREMITY;  Surgeon: Runell Gess, MD;  Location: [facility];  Service: Cardiovascular;  Laterality: N/A;   ANTERIOR CERVICAL DECOMP/DISCECTOMY FUSION  12/19/2011   Procedure: ANTERIOR CERVICAL DECOMPRESSION/DISCECTOMY FUSION 2 LEVELS;  Surgeon: Karn Cassis, MD;  Location: [facility];  Service: Neurosurgery;  Laterality: N/A;  Cervical four-five Cervical five-six  Anterior cervical decompression/diskectomy, fusion, plate   ATHERECTOMY N/A 01/02/2013   Procedure: ATHERECTOMY;  Surgeon: Runell Gess, MD;  Location: [facility] CATH LAB;  Service: Cardiovascular;  Laterality: N/A;   BACK SURGERY     x3   CARDIAC CATHETERIZATION  08/19/2012   CABG warranted; Severe ostial LCx stenosis with mid to distal vessel occlusion   COLONOSCOPY W/ POLYPECTOMY  08/28/2011   Rourk-tubular adenomas removed from ascending  colon, suboptimal prep, diminutive rectal polyps   COLONOSCOPY WITH PROPOFOL N/A 06/13/2017   Procedure: COLONOSCOPY WITH PROPOFOL;  Surgeon: Corbin Ade, MD;  Location: [facility];  Service: Endoscopy;  Laterality: N/A;  9:15am   CORONARY ARTERY BYPASS GRAFT  08/22/2012   Procedure: CORONARY ARTERY BYPASS GRAFTING (CABG);  Surgeon: Delight Ovens, MD;  Location: [facility] OR;  Service: Open Heart Surgery;   Laterality: N/A;  CABG x four,  using left internal mammary artery and right leg greater saphenous vein harvested endoscopically   ESOPHAGOGASTRODUODENOSCOPY  05/24/2001   by Dr. Jena Gauss   ESOPHAGOGASTRODUODENOSCOPY  08/28/2011   Rourk-erosive reflux esophagitis, gastric erosions   ESOPHAGOGASTRODUODENOSCOPY (EGD) WITH PROPOFOL N/A 06/13/2017   Procedure: ESOPHAGOGASTRODUODENOSCOPY (EGD) WITH PROPOFOL;  Surgeon: Corbin Ade, MD;  Location: [facility];  Service: Endoscopy;  Laterality: N/A;   INTRAOPERATIVE TRANSESOPHAGEAL ECHOCARDIOGRAM  08/22/2012   Procedure: INTRAOPERATIVE TRANSESOPHAGEAL ECHOCARDIOGRAM;  Surgeon: Delight Ovens, MD;  Location: [facility] OR;  Service: Open Heart Surgery;  Laterality: N/A;   IR 3D INDEPENDENT WKST  05/17/2022   IR ANGIO INTRA EXTRACRAN SEL INTERNAL CAROTID UNI R MOD SED  05/17/2022   IR ANGIO INTRA EXTRACRAN SEL INTERNAL CAROTID UNI R MOD SED  12/04/2022   IR ANGIOGRAM FOLLOW UP STUDY  05/17/2022   IR NEURO EACH ADD'L AFTER BASIC UNI RIGHT (MS)  05/17/2022   IR TRANSCATH/EMBOLIZ  05/17/2022   IR US GUIDE VASC ACCESS RIGHT  05/17/2022   IR US GUIDE VASC ACCESS RIGHT  12/04/2022   LEFT HEART CATHETERIZATION WITH CORONARY ANGIOGRAM N/A 08/19/2012   Procedure: LEFT HEART CATHETERIZATION WITH CORONARY ANGIOGRAM;  Surgeon: Chrystie Nose, MD;  Location: [facility];  Service: Cardiovascular;  Laterality: N/A;   LOWER EXTREMITY ANGIOGRAM N/A 11/17/2012   Procedure: LOWER EXTREMITY ANGIOGRAM;  Surgeon: Runell Gess, MD;  Location: [facility] CATH LAB;  Service: Cardiovascular;  Laterality: N/A;   LOWER EXTREMITY ANGIOGRAM N/A 02/02/2013   Procedure: LOWER EXTREMITY ANGIOGRAM;  Surgeon: Runell Gess, MD;  Location: [facility] CATH LAB;  Service: Cardiovascular;  Laterality: N/A;   LUMBAR DISC SURGERY     LUMBAR FUSION  ~ 2011   PELVIC ABCESS DRAINAGE     x2   PERIPHERAL VASCULAR ATHERECTOMY  03/30/2021   Procedure: PERIPHERAL VASCULAR ATHERECTOMY;  Surgeon: Runell Gess, MD;  Location: [facility] INVASIVE CV LAB;  Service: Cardiovascular;;  left common iliac artery   PERIPHERAL VASCULAR ATHERECTOMY  04/01/2023   Procedure: PERIPHERAL VASCULAR ATHERECTOMY;  Surgeon: Runell Gess, MD;  Location: [facility] INVASIVE CV LAB;  Service: Cardiovascular;;   PERIPHERAL VASCULAR BALLOON ANGIOPLASTY Left 04/12/2022   Procedure: PERIPHERAL VASCULAR BALLOON ANGIOPLASTY;  Surgeon: Runell Gess, MD;  Location: [facility];  Service: Cardiovascular;  Laterality: Left;  SFA   PERIPHERAL VASCULAR BALLOON ANGIOPLASTY Right 04/01/2023   Procedure: PERIPHERAL VASCULAR BALLOON ANGIOPLASTY;  Surgeon: Runell Gess, MD;  Location: [facility];  Service: Cardiovascular;  Laterality: Right;  DCB - SFA   PERIPHERAL VASCULAR INTERVENTION  03/30/2021   Procedure: PERIPHERAL VASCULAR INTERVENTION;  Surgeon: Runell Gess, MD;  Location: [facility];  Service: Cardiovascular;;   POLYPECTOMY  06/13/2017   Procedure: POLYPECTOMY;  Surgeon: Corbin Ade, MD;  Location: [facility];  Service: Endoscopy;;  colon   RADIOLOGY WITH ANESTHESIA N/A 05/17/2022   Procedure: Diagnostic Cerebral Angiogram, Possible Aneurysm Coiling, Possible Stent;  Surgeon: Lisbeth Renshaw, MD;  Location: [facility] OR;  Service: Radiology;  Laterality: N/A;   SHOULDER SURGERY Right    TOTAL HIP ARTHROPLASTY Right 12/25/2022   Procedure: TOTAL HIP ARTHROPLASTY;  Surgeon: Teryl Lucy, MD;  Location: [facility] ORS;  Service: Orthopedics;  Laterality: Right;   TOTAL HIP ARTHROPLASTY Right    VASCULAR SURGERY Right 01/02/2013   diamondback orbital rotational  atherectomy, chocolate balloon and IDEV  Stent.    Family History  Problem Relation Age of Onset   Anesthesia problems Neg Hx    Colon cancer Neg Hx    Gastric cancer Neg Hx    Esophageal cancer Neg Hx    Colon polyps Neg Hx     SOCIAL HISTORY: Social History   Socioeconomic History   Marital status: Single    Spouse name: Not  on file   Number of children: 1   Years of education: Not on file   Highest education level: Not on file  Occupational History   Occupation: disabled    Employer: DISABLED  Tobacco Use   Smoking status: Every Day    Current packs/day: 1.50    Average packs/day: 1.5 packs/day for 39.0 years (58.5 ttl pk-yrs)    Types: Cigarettes   Smokeless tobacco: Never  Vaping Use   Vaping status: Never Used  Substance and Sexual Activity   Alcohol use: Yes    Alcohol/week: 12.0 standard drinks of alcohol    Types: 12 Cans of beer per week    Comment: weekly   Drug use: No   Sexual activity: Not Currently  Other Topics Concern   Not on file  Social History Narrative   Lives w/ girlfriend   Social Determinants of Health   Financial Resource Strain: Low Risk  (03/20/2023)   Received from [facility]   Overall Financial Resource Strain (CARDIA)    Difficulty of Paying Living Expenses: Not very hard  Food Insecurity: Low Risk  (05/23/2023)   Received from [facility]   Hunger Vital Sign    Worried About Running Out of Food in the Last Year: Never true    Ran Out of Food in the Last Year: Never true  Transportation Needs: No Transportation Needs (05/23/2023)   Received from [facility]    In the past 12 months, has lack of reliable transportation kept you from medical appointments, meetings, work or from getting things needed for daily living? : No  Physical Activity: Unknown (03/20/2023)   Received from [facility]   Exercise Vital Sign    Days of Exercise per Week: 0 days    Minutes of Exercise per Session: Not on file  Stress: No Stress Concern Present (03/20/2023)   Received from [facility] - Occupational Stress Questionnaire    Feeling of Stress : Not at all  Social Connections: Moderately Integrated (03/20/2023)   Received [facility]   Social Network    How would you rate your social network (family,  work, friends)?: Adequate participation with social networks  Intimate Partner Violence: Not At Risk (04/01/2023)   Humiliation, Afraid, Rape, and Kick questionnaire    Fear of Current or Ex-Partner: No    Emotionally Abused: No    Physically Abused: No    Sexually Abused: No    No Known Allergies  Current Outpatient Medications  Medication Sig Dispense Refill   acetaminophen (TYLENOL) 650 MG CR tablet Take 1,300 mg by mouth daily as needed for pain.     albuterol (VENTOLIN HFA) 108 (90 Base)  MCG/ACT inhaler Inhale 2 puffs into the lungs every 6 (six) hours as needed for wheezing or shortness of breath.     aspirin EC 81 MG EC tablet Take 1 tablet (81 mg total) by mouth daily.     Cholecalciferol (VITAMIN D-3 PO) Take 1 capsule by mouth daily.     clopidogrel (PLAVIX) 75 MG tablet Take 1 tablet (75 mg total) by mouth daily with breakfast. 30 tablet 11   cyclobenzaprine (FLEXERIL) 10 MG tablet Take 1 tablet (10 mg total) by mouth every 8 (eight) hours as needed for muscle spasms.     empagliflozin (JARDIANCE) 25 MG TABS tablet Take 25 mg by mouth daily.     folic acid (FOLVITE) 1 MG tablet Take 1 tablet (1 mg total) by mouth daily. 30 tablet 1   gabapentin (NEURONTIN) 600 MG tablet Take 600 mg by mouth 3 (three) times daily.     glipiZIDE (GLUCOTROL XL) 10 MG 24 hr tablet Take 10 mg by mouth 2 (two) times daily.     icosapent Ethyl (VASCEPA) 1 g capsule Take 2 g by mouth 2 (two) times daily.     isosorbide mononitrate (IMDUR) 60 MG 24 hr tablet Take 1 tablet (60 mg total) by mouth daily. 30 tablet 2   losartan (COZAAR) 50 MG tablet Take 50 mg by mouth daily.     metoprolol succinate (TOPROL-XL) 25 MG 24 hr tablet TAKE 1 TABLET BY MOUTH ONCE A DAY. 90 tablet 3   ondansetron (ZOFRAN) 4 MG tablet Take 1 tablet (4 mg total) by mouth every 8 (eight) hours as needed for nausea or vomiting. 10 tablet 0   oxyCODONE-acetaminophen (PERCOCET) 10-325 MG tablet Take 1 tablet by mouth 4 (four) times  daily as needed for pain.     rosuvastatin (CRESTOR) 40 MG tablet Take 1 tablet (40 mg total) by mouth daily. 30 tablet 1   thiamine (VITAMIN B-1) 100 MG tablet Take 1 tablet (100 mg total) by mouth daily. 30 tablet 2   zolpidem (AMBIEN CR) 12.5 MG CR tablet Take 12.5 mg by mouth at bedtime.     No current facility-administered medications for this visit.    REVIEW OF SYSTEMS:  [X]  denotes positive finding, [ ]  denotes negative finding Cardiac  Comments:  Chest pain or chest pressure:    Shortness of breath upon exertion:    Short of breath when lying flat:    Irregular heart rhythm:        Vascular    Pain in calf, thigh, or hip brought on by ambulation:    Pain in feet at night that wakes you up from your sleep:  x   Blood clot in your veins:    Leg swelling:         Pulmonary    Oxygen at home:    Productive cough:     Wheezing:         Neurologic    Sudden weakness in arms or legs:     Sudden numbness in arms or legs:     Sudden onset of difficulty speaking or slurred speech:    Temporary loss of vision in one eye:     Problems with dizziness:         Gastrointestinal    Blood in stool:     Vomited blood:         Genitourinary    Burning when urinating:     Blood in urine:  Psychiatric    Major depression:         Hematologic    Bleeding problems:    Problems with blood clotting too easily:        Skin    Rashes or ulcers:        Constitutional    Fever or chills:      PHYSICAL EXAM: There were no vitals filed for this visit.  GENERAL: The patient is a well-nourished male, in no acute distress. The vital signs are documented above. CARDIAC: There is a regular rate and rhythm.  VASCULAR:  Weakly palpable femoral pulses Left DP palpable No palpable right pedal pulses No tissue loss PULMONARY: No respiratory distress.   ABDOMEN: Soft and non-tender. MUSCULOSKELETAL: There are no major deformities or cyanosis. NEUROLOGIC: No focal weakness or  paresthesias are detected. SKIN: There are no ulcers or rashes noted. PSYCHIATRIC: The patient has a normal affect.  DATA:   ABIs 04/15/23 are 0.66 on the right monophasic and 0.89 left biphasic   Right leg arterial duplex 04/15/23 suggest moderate common femoral stenosis 50-74% as well as proximal SFA disease   Assessment/Plan:  62 y.o. male, with history of coronary artery disease status post CABG, COPD, hypertension, hyperlipidemia, diabetes, tobacco abuse that presents for evaluation of PAD.  Patient complains of 3 months of pain and numbness in the right foot.  He has previously been under the care of Dr. Allyson Sabal and most recently underwent right SFA orbital atherectomy on 04/01/2023 and his ABIs only mildly improved from 0.55 to 0.66.  I discussed that with an ABI 0.66 I would not expect him to have ischemic rest pain keeping him awake at night with constant numbness.  The foot is warm.  He may have another etiology for his pain and numbness.  However, I cannot feel a very good femoral pulse on the right.   There is some question of right external iliac disease as well as common femoral disease and proximal SFA disease.   I have recommended CTA aortobifem with runoff and then follow-up with me. I will evaluate his imaging and then further discuss with the patient as well as Dr. Allyson Sabal.   Cephus Shelling, MD [facility] Office: [phone]

## 2023-06-11 ENCOUNTER — Encounter: Payer: Self-pay | Admitting: Vascular Surgery

## 2023-06-11 ENCOUNTER — Ambulatory Visit (INDEPENDENT_AMBULATORY_CARE_PROVIDER_SITE_OTHER): Payer: 59 | Admitting: Vascular Surgery

## 2023-06-11 VITALS — BP 160/89 | HR 68 | Temp 98.3°F | Ht 71.0 in | Wt 192.8 lb

## 2023-06-11 DIAGNOSIS — I739 Peripheral vascular disease, unspecified: Secondary | ICD-10-CM | POA: Diagnosis not present

## 2023-06-14 ENCOUNTER — Other Ambulatory Visit: Payer: Self-pay

## 2023-06-14 DIAGNOSIS — I739 Peripheral vascular disease, unspecified: Secondary | ICD-10-CM

## 2023-06-17 ENCOUNTER — Ambulatory Visit (HOSPITAL_COMMUNITY)
Admission: RE | Admit: 2023-06-17 | Discharge: 2023-06-17 | Disposition: A | Discharge status: Home / Self Care | Payer: 59 | Source: Ambulatory Visit | Attending: Internal Medicine | Admitting: Internal Medicine

## 2023-06-17 DIAGNOSIS — J432 Centrilobular emphysema: Secondary | ICD-10-CM | POA: Diagnosis not present

## 2023-06-17 DIAGNOSIS — R918 Other nonspecific abnormal finding of lung field: Secondary | ICD-10-CM | POA: Insufficient documentation

## 2023-06-17 DIAGNOSIS — I700 Atherosclerosis of aorta: Secondary | ICD-10-CM | POA: Diagnosis not present

## 2023-06-21 DIAGNOSIS — M48062 Spinal stenosis, lumbar region with neurogenic claudication: Secondary | ICD-10-CM | POA: Diagnosis not present

## 2023-06-21 DIAGNOSIS — Z79899 Other long term (current) drug therapy: Secondary | ICD-10-CM | POA: Diagnosis not present

## 2023-06-21 DIAGNOSIS — M51371 Other intervertebral disc degeneration, lumbosacral region with lower extremity pain only: Secondary | ICD-10-CM | POA: Diagnosis not present

## 2023-06-21 DIAGNOSIS — I739 Peripheral vascular disease, unspecified: Secondary | ICD-10-CM | POA: Diagnosis not present

## 2023-06-28 DIAGNOSIS — I739 Peripheral vascular disease, unspecified: Secondary | ICD-10-CM | POA: Diagnosis not present

## 2023-06-29 LAB — BUN+CREAT
BUN/Creatinine Ratio: 17 (ref 10–24)
BUN: 15 mg/dL (ref 8–27)
Creatinine, Ser: 0.89 mg/dL (ref 0.76–1.27)
eGFR: 97 mL/min/{1.73_m2} (ref >59)

## 2023-07-03 ENCOUNTER — Ambulatory Visit (HOSPITAL_COMMUNITY)
Admission: RE | Admit: 2023-07-03 | Discharge: 2023-07-03 | Disposition: A | Discharge status: Home / Self Care | Payer: 59 | Source: Ambulatory Visit | Attending: Vascular Surgery | Admitting: Vascular Surgery

## 2023-07-03 DIAGNOSIS — I739 Peripheral vascular disease, unspecified: Secondary | ICD-10-CM | POA: Diagnosis not present

## 2023-07-03 DIAGNOSIS — I700 Atherosclerosis of aorta: Secondary | ICD-10-CM | POA: Diagnosis not present

## 2023-07-03 DIAGNOSIS — N3289 Other specified disorders of bladder: Secondary | ICD-10-CM | POA: Diagnosis not present

## 2023-07-03 MED ORDER — IOHEXOL 350 MG/ML SOLN
100.0000 mL | Freq: Once | INTRAVENOUS | Status: AC | PRN
  Administered 2023-07-03: 100 mL via INTRAVENOUS

## 2023-07-08 DIAGNOSIS — Z79899 Other long term (current) drug therapy: Secondary | ICD-10-CM | POA: Diagnosis not present

## 2023-07-08 NOTE — Progress Notes (Unsigned)
Patient name: [name] MRN: [id number] DOB: November 27, 1960 Sex: male  REASON FOR CONSULT: F/U after CTA, PAD with right foot pain  HPI: [name] is a 62 y.o. male, with history of coronary artery disease status post CABG, COPD, hypertension, hyperlipidemia, diabetes, tobacco abuse that presents for follow-up after CTA for evaluation of peripheral arterial disease.  Patient previously seen with right foot pain, constantly.  Has been hurting for 3-4 months with pain and numbness all the time.  Stated this started after his hip surgery about 4 months ago.  He has been under the care of Dr. Allyson Sabal.  Most recently had a right SFA orbital atherectomy with Dr. Allyson Sabal on 04/01/2023 and his ABIs improved from 0.55 to 0.66.  He has had prior right SFA intervention in the past prior to his most recent SFA intervention.  In review of the notes he previously had a left SFA CTO treated by Hoy Finlay.  On 03/30/2021 he had a orbital atherectomy and VBX stenting of his left common iliac artery for stenosis by Dr. Allyson Sabal.  Recent duplex suggests some right common femoral and proximal SFA disease as well as right external iliac disease.  I then sent him for CTA.  Past Medical History:  Diagnosis Date   Arthritis    "some; in hips sometimes" (01/02/2013)   Back pain    "w/prolonged walking or standing" (01/02/2013)   CAD (coronary artery disease)    CABG X 4 08/2012   COPD (chronic obstructive pulmonary disease) (HCC)    Elevated PSA    Esophageal erosions 05/24/2001   egd by Dr. Jena Gauss   GERD (gastroesophageal reflux disease)    H/O hiatal hernia    Headache    History of gout    Hyperlipidemia    Hypertension    not on any medication for htn at present   PAD (peripheral artery disease) (HCC)    LEA DOPPLER, 11/11/2012 - moderate arterial insufficiency to lower extremities at rest, BILATERAL SFA-demonstrates occlusive disease with reconstitution of flow   S/P percutaneous transluminal angioplasty  (PTA) with stent placement 03/29/21 03/31/2021   Tobacco abuse    Tubular adenoma of colon 08/28/2011   Type II diabetes mellitus (HCC)    Unintentional weight loss     Past Surgical History:  Procedure Laterality Date   ABDOMINAL AORTOGRAM W/LOWER EXTREMITY N/A 03/30/2021   Procedure: ABDOMINAL AORTOGRAM W/LOWER EXTREMITY;  Surgeon: Runell Gess, MD;  [facility]: [facility];  Service: Cardiovascular;  Laterality: N/A;   ABDOMINAL AORTOGRAM W/LOWER EXTREMITY N/A 04/12/2022   Procedure: ABDOMINAL AORTOGRAM W/LOWER EXTREMITY;  Surgeon: Runell Gess, MD;  Location: [facility];  Service: Cardiovascular;  Laterality: N/A;   ABDOMINAL AORTOGRAM W/LOWER EXTREMITY N/A 04/01/2023   Procedure: ABDOMINAL AORTOGRAM W/LOWER EXTREMITY;  Surgeon: Runell Gess, MD;  Location: [facility];  Service: Cardiovascular;  Laterality: N/A;   ANTERIOR CERVICAL DECOMP/DISCECTOMY FUSION  12/19/2011   Procedure: ANTERIOR CERVICAL DECOMPRESSION/DISCECTOMY FUSION 2 LEVELS;  Surgeon: Karn Cassis, MD;  Location: [facility];  Service: Neurosurgery;  Laterality: N/A;  Cervical four-five Cervical five-six  Anterior cervical decompression/diskectomy, fusion, plate   ATHERECTOMY N/A 01/02/2013   Procedure: ATHERECTOMY;  Surgeon: Runell Gess, MD;  [facility] CATH LAB;  Service: Cardiovascular;  Laterality: N/A;   BACK SURGERY     x3   CARDIAC CATHETERIZATION  08/19/2012   CABG warranted; Severe ostial LCx stenosis with mid to distal vessel occlusion  COLONOSCOPY W/ POLYPECTOMY  08/28/2011   Rourk-tubular adenomas removed from ascending colon, suboptimal prep, diminutive rectal polyps   COLONOSCOPY WITH PROPOFOL N/A 06/13/2017   Procedure: COLONOSCOPY WITH PROPOFOL;  Surgeon: Corbin Ade, MD;  Location: [facility];  Service: Endoscopy;  Laterality: N/A;  9:15am   CORONARY ARTERY BYPASS GRAFT  08/22/2012   Procedure: CORONARY ARTERY BYPASS GRAFTING (CABG);  Surgeon: Delight Ovens, MD;  Location: [facility] OR;  Service: Open Heart Surgery;  Laterality: N/A;  CABG x four,  using left internal mammary artery and right leg greater saphenous vein harvested endoscopically   ESOPHAGOGASTRODUODENOSCOPY  05/24/2001   by Dr. Jena Gauss   ESOPHAGOGASTRODUODENOSCOPY  08/28/2011   Rourk-erosive reflux esophagitis, gastric erosions   ESOPHAGOGASTRODUODENOSCOPY (EGD) WITH PROPOFOL N/A 06/13/2017   Procedure: ESOPHAGOGASTRODUODENOSCOPY (EGD) WITH PROPOFOL;  Surgeon: Corbin Ade, MD;  [facility]: [facility];  Service: Endoscopy;  Laterality: N/A;   INTRAOPERATIVE TRANSESOPHAGEAL ECHOCARDIOGRAM  08/22/2012   Procedure: INTRAOPERATIVE TRANSESOPHAGEAL ECHOCARDIOGRAM;  Surgeon: Delight Ovens, MD;  Location: [facility] OR;  Service: Open Heart Surgery;  Laterality: N/A;   IR 3D INDEPENDENT WKST  05/17/2022   IR ANGIO INTRA EXTRACRAN SEL INTERNAL CAROTID UNI R MOD SED  05/17/2022   IR ANGIO INTRA EXTRACRAN SEL INTERNAL CAROTID UNI R MOD SED  12/04/2022   IR ANGIOGRAM FOLLOW UP STUDY  05/17/2022   IR NEURO EACH ADD'L AFTER BASIC UNI RIGHT (MS)  05/17/2022   IR TRANSCATH/EMBOLIZ  05/17/2022   IR US GUIDE VASC ACCESS RIGHT  05/17/2022   IR US GUIDE VASC ACCESS RIGHT  12/04/2022   LEFT HEART CATHETERIZATION WITH CORONARY ANGIOGRAM N/A 08/19/2012   Procedure: LEFT HEART CATHETERIZATION WITH CORONARY ANGIOGRAM;  Surgeon: Chrystie Nose, MD;  Location: [facility] CATH LAB;  Service: Cardiovascular;  Laterality: N/A;   LOWER EXTREMITY ANGIOGRAM N/A 11/17/2012   Procedure: LOWER EXTREMITY ANGIOGRAM;  Surgeon: Runell Gess, MD;  Location: [facility] CATH LAB;  Service: Cardiovascular;  Laterality: N/A;   LOWER EXTREMITY ANGIOGRAM N/A 02/02/2013   Procedure: LOWER EXTREMITY ANGIOGRAM;  Surgeon: Runell Gess, MD;  Location: [facility] CATH LAB;  Service: Cardiovascular;  Laterality: N/A;   LUMBAR DISC SURGERY     LUMBAR FUSION  ~ 2011   PELVIC ABCESS DRAINAGE     x2   PERIPHERAL VASCULAR ATHERECTOMY  03/30/2021    Procedure: PERIPHERAL VASCULAR ATHERECTOMY;  Surgeon: Runell Gess, MD;  Location: [facility] INVASIVE CV LAB;  Service: Cardiovascular;;  left common iliac artery   PERIPHERAL VASCULAR ATHERECTOMY  04/01/2023   Procedure: PERIPHERAL VASCULAR ATHERECTOMY;  Surgeon: Runell Gess, MD;  Location: [facility] INVASIVE CV LAB;  Service: Cardiovascular;;   PERIPHERAL VASCULAR BALLOON ANGIOPLASTY Left 04/12/2022   Procedure: PERIPHERAL VASCULAR BALLOON ANGIOPLASTY;  Surgeon: Runell Gess, MD;  [facility];  Service: Cardiovascular;  Laterality: Left;  SFA   PERIPHERAL VASCULAR BALLOON ANGIOPLASTY Right 04/01/2023   Procedure: PERIPHERAL VASCULAR BALLOON ANGIOPLASTY;  Surgeon: Runell Gess, MD;  [facility];  Service: Cardiovascular;  Laterality: Right;  DCB - SFA   PERIPHERAL VASCULAR INTERVENTION  03/30/2021   Procedure: PERIPHERAL VASCULAR INTERVENTION;  Surgeon: Runell Gess, MD;  [facility]: [facility];  Service: Cardiovascular;;   POLYPECTOMY  06/13/2017   Procedure: POLYPECTOMY;  Surgeon: Corbin Ade, MD;  [facility];  Service: Endoscopy;;  colon   RADIOLOGY WITH ANESTHESIA N/A 05/17/2022   Procedure: Diagnostic Cerebral Angiogram, Possible Aneurysm Coiling, Possible Stent;  Surgeon: Lisbeth Renshaw, MD;  Location: [facility] OR;  Service: Radiology;  Laterality: N/A;   SHOULDER SURGERY Right    TOTAL HIP ARTHROPLASTY Right 12/25/2022   Procedure: TOTAL HIP ARTHROPLASTY;  Surgeon: Teryl Lucy, MD;  Location: [facility] ORS;  Service: Orthopedics;  Laterality: Right;   TOTAL HIP ARTHROPLASTY Right    VASCULAR SURGERY Right 01/02/2013   diamondback orbital rotational  atherectomy, chocolate balloon and IDEV  Stent.    Family History  Problem Relation Age of Onset   Anesthesia problems Neg Hx    Colon cancer Neg Hx    Gastric cancer Neg Hx    Esophageal cancer Neg Hx    Colon polyps Neg Hx     SOCIAL HISTORY: Social History    Socioeconomic History   Marital status: Single    Spouse name: Not on file   Number of children: 1   Years of education: Not on file   Highest education level: Not on file  Occupational History   Occupation: disabled    Employer: DISABLED  Tobacco Use   Smoking status: Every Day    Current packs/day: 1.50    Average packs/day: 1.5 packs/day for 39.0 years (58.5 ttl pk-yrs)    Types: Cigarettes   Smokeless tobacco: Never  Vaping Use   Vaping status: Never Used  Substance and Sexual Activity   Alcohol use: Yes    Alcohol/week: 12.0 standard drinks of alcohol    Types: 12 Cans of beer per week    Comment: weekly   Drug use: No   Sexual activity: Not Currently  Other Topics Concern   Not on file  Social History Narrative   Lives w/ girlfriend   Social Determinants of Health   Financial Resource Strain: Low Risk  (03/20/2023)   Received from [facility]   Overall Financial Resource Strain (CARDIA)    Difficulty of Paying Living Expenses: Not very hard  Food Insecurity: Low Risk  (05/23/2023)   Received from [facility]   Hunger Vital Sign    Worried About Running Out of Food in the Last Year: Never true    Ran Out of Food in the Last Year: Never true  Transportation Needs: No Transportation Needs (05/23/2023)   Received from [facility]    In the past 12 months, has lack of reliable transportation kept you from medical appointments, meetings, work or from getting things needed for daily living? : No  Physical Activity: Unknown (03/20/2023)   Received from [facility]   Exercise Vital Sign    Days of Exercise per Week: 0 days    Minutes of Exercise per Session: Not on file  Stress: No Stress Concern Present (03/20/2023)   Received from [facility] - Occupational Stress Questionnaire    Feeling of Stress : Not at all  Social Connections: Moderately Integrated (03/20/2023)   Received from [facility]    Social Network    How would you rate your social network (family, work, friends)?: Adequate participation with social networks  Intimate Partner Violence: Not At Risk (04/01/2023)   Humiliation, Afraid, Rape, and Kick questionnaire    Fear of Current or Ex-Partner: No    Emotionally Abused: No    Physically Abused: No    Sexually Abused: No    No Known Allergies  Current Outpatient Medications  Medication Sig Dispense Refill   acetaminophen (TYLENOL) 650 MG CR tablet Take 1,300 mg by mouth daily as needed  for pain.     albuterol (VENTOLIN HFA) 108 (90 Base) MCG/ACT inhaler Inhale 2 puffs into the lungs every 6 (six) hours as needed for wheezing or shortness of breath.     aspirin EC 81 MG EC tablet Take 1 tablet (81 mg total) by mouth daily.     Cholecalciferol (VITAMIN D-3 PO) Take 1 capsule by mouth daily.     clopidogrel (PLAVIX) 75 MG tablet Take 1 tablet (75 mg total) by mouth daily with breakfast. 30 tablet 11   cyclobenzaprine (FLEXERIL) 10 MG tablet Take 1 tablet (10 mg total) by mouth every 8 (eight) hours as needed for muscle spasms.     empagliflozin (JARDIANCE) 25 MG TABS tablet Take 25 mg by mouth daily.     folic acid (FOLVITE) 1 MG tablet Take 1 tablet (1 mg total) by mouth daily. 30 tablet 1   gabapentin (NEURONTIN) 600 MG tablet Take 600 mg by mouth 3 (three) times daily. (Patient not taking: Reported on 06/11/2023)     gabapentin (NEURONTIN) 800 MG tablet Take 800 mg by mouth 3 (three) times daily.     glipiZIDE (GLUCOTROL XL) 10 MG 24 hr tablet Take 10 mg by mouth 2 (two) times daily.     icosapent Ethyl (VASCEPA) 1 g capsule Take 2 g by mouth 2 (two) times daily.     isosorbide mononitrate (IMDUR) 60 MG 24 hr tablet Take 1 tablet (60 mg total) by mouth daily. 30 tablet 2   losartan (COZAAR) 50 MG tablet Take 50 mg by mouth daily.     metoprolol succinate (TOPROL-XL) 25 MG 24 hr tablet TAKE 1 TABLET BY MOUTH ONCE A DAY. 90 tablet 3   ondansetron (ZOFRAN) 4 MG tablet  Take 1 tablet (4 mg total) by mouth every 8 (eight) hours as needed for nausea or vomiting. 10 tablet 0   oxyCODONE-acetaminophen (PERCOCET) 10-325 MG tablet Take 1 tablet by mouth 4 (four) times daily as needed for pain.     rosuvastatin (CRESTOR) 40 MG tablet Take 1 tablet (40 mg total) by mouth daily. 30 tablet 1   thiamine (VITAMIN B-1) 100 MG tablet Take 1 tablet (100 mg total) by mouth daily. 30 tablet 2   zolpidem (AMBIEN CR) 12.5 MG CR tablet Take 12.5 mg by mouth at bedtime.     No current facility-administered medications for this visit.    REVIEW OF SYSTEMS:  [X]  denotes positive finding, [ ]  denotes negative finding Cardiac  Comments:  Chest pain or chest pressure:    Shortness of breath upon exertion:    Short of breath when lying flat:    Irregular heart rhythm:        Vascular    Pain in calf, thigh, or hip brought on by ambulation:    Pain in feet at night that wakes you up from your sleep:  x right  Blood clot in your veins:    Leg swelling:         Pulmonary    Oxygen at home:    Productive cough:     Wheezing:         Neurologic    Sudden weakness in arms or legs:     Sudden numbness in arms or legs:     Sudden onset of difficulty speaking or slurred speech:    Temporary loss of vision in one eye:     Problems with dizziness:         Gastrointestinal    Blood  in stool:     Vomited blood:         Genitourinary    Burning when urinating:     Blood in urine:        Psychiatric    Major depression:         Hematologic    Bleeding problems:    Problems with blood clotting too easily:        Skin    Rashes or ulcers:        Constitutional    Fever or chills:      PHYSICAL EXAM: There were no vitals filed for this visit.  GENERAL: The patient is a well-nourished male, in no acute distress. The vital signs are documented above. CARDIAC: There is a regular rate and rhythm.  VASCULAR:  Weakly palpable femoral pulses Left DP palpable No  palpable right pedal pulses No tissue loss PULMONARY: No respiratory distress.   ABDOMEN: Soft and non-tender. MUSCULOSKELETAL: There are no major deformities or cyanosis. NEUROLOGIC: No focal weakness or paresthesias are detected. SKIN: There are no ulcers or rashes noted. PSYCHIATRIC: The patient has a normal affect.  DATA:   CTA reviewed from 07/03/2023 with greater than 70% stenosis of the right common and external iliac artery and also >50% stenosis of the right common femoral artery.  His right SFA intervention is patent after previous stenting.  ABIs 04/15/23 are 0.66 on the right monophasic and 0.89 left biphasic   Right leg arterial duplex 04/15/23 suggest moderate common femoral stenosis 50-74% as well as proximal SFA disease   Assessment/Plan:  62 y.o. male, with history of coronary artery disease status post CABG, COPD, hypertension, hyperlipidemia, diabetes, tobacco abuse that presents for follow-up after CTA and further evaluation of his PAD.  Patient complains of 3-4 months of pain and numbness in the right foot.  He has previously been under the care of Dr. Allyson Sabal and most recently underwent right SFA orbital atherectomy on 04/01/2023 and his ABIs only mildly improved from 0.55 to 0.66.  I previously  discussed that with an ABI 0.66 I would not expect him to have ischemic rest pain keeping him awake at night with constant numbness.  I did send him for a CTA.  Discussed that his CTA confirmed significant right common femoral disease with iliac disease - he also has tibial disease (mult-level as we discussed today).  I again discussed with an ABI of 0.6 I would not expect him to have ischemic rest pain.  He tells me that he cannot live with the symptoms and would be willing to pursue limb amputation given the severity of his discomfort in the foot.  I reviewed his CTA showing multilevel occlusive disease.  Discussed his SFA intervention from Dr. Allyson Sabal remains patent.  From my standpoint  discussed I would offer a right common femoral endarterectomy with retrograde iliac stenting.  Risks and benefits discussed including risk of wound healing problems, infection, bleeding, vessel injury.  He wishes to get surgery scheduled.  I discussed the other option of continued observation which he does not feel he can tolerate.  Again discussed that if his pain does not improve with further revascularization this is likely related to neuropathy or some other etiology.  I will also reach out to Dr. Allyson Sabal to make sure he is in agreement.  Cephus Shelling, MD [facility] Office: [phone]

## 2023-07-09 ENCOUNTER — Ambulatory Visit (INDEPENDENT_AMBULATORY_CARE_PROVIDER_SITE_OTHER): Payer: 59 | Admitting: Vascular Surgery

## 2023-07-09 ENCOUNTER — Encounter: Payer: Self-pay | Admitting: Vascular Surgery

## 2023-07-09 VITALS — BP 131/81 | HR 76 | Ht 71.0 in | Wt 191.8 lb

## 2023-07-09 DIAGNOSIS — I70221 Atherosclerosis of native arteries of extremities with rest pain, right leg: Secondary | ICD-10-CM | POA: Insufficient documentation

## 2023-07-10 ENCOUNTER — Other Ambulatory Visit (HOSPITAL_COMMUNITY): Payer: Self-pay | Admitting: *Deleted

## 2023-07-10 DIAGNOSIS — R918 Other nonspecific abnormal finding of lung field: Secondary | ICD-10-CM

## 2023-07-15 ENCOUNTER — Other Ambulatory Visit: Payer: Self-pay | Admitting: *Deleted

## 2023-07-15 DIAGNOSIS — I70221 Atherosclerosis of native arteries of extremities with rest pain, right leg: Secondary | ICD-10-CM

## 2023-07-15 DIAGNOSIS — I6529 Occlusion and stenosis of unspecified carotid artery: Secondary | ICD-10-CM

## 2023-07-18 ENCOUNTER — Other Ambulatory Visit (HOSPITAL_COMMUNITY): Payer: Self-pay | Admitting: *Deleted

## 2023-07-25 NOTE — Progress Notes (Signed)
Surgical Instructions   Your procedure is scheduled on Monday, August 05, 2023. Report to [facility] Main Entrance "A" at 8:35 A.M., then check in with the Admitting office. Any questions or running late day of surgery: call [phone]  Questions prior to your surgery date: call [phone], Monday-Friday, 8am-4pm. If you experience any cold or flu symptoms such as cough, fever, chills, shortness of breath, etc. between now and your scheduled surgery, please notify us at the above number.     Remember:  Do not eat  or drink after midnight the night before your surgery  Take these medicines the morning of surgery with A SIP OF WATER  aspirin EC  clopidogrel (PLAVIX)  gabapentin (NEURONTIN)  icosapent Ethyl (VASCEPA)  isosorbide mononitrate (IMDUR)  metoprolol succinate (TOPROL-XL)  rosuvastatin (CRESTOR)   May take these medicines IF NEEDED: acetaminophen (TYLENOL)  albuterol (VENTOLIN HFA) 108 (90 Base) MCG/ACT inhaler.  Please bring with you to the hospital.   cyclobenzaprine (FLEXERIL)  ondansetron (ZOFRAN)  oxyCODONE-acetaminophen (PERCOCET)    One week prior to surgery, STOP taking any Aleve, Naproxen, Ibuprofen, Motrin, Advil, Goody's, BC's, all herbal medications, fish oil, and non-prescription vitamins.   WHAT DO I DO ABOUT MY DIABETES MEDICATION?   Do not take oral diabetes medicines (pills) the morning of surgery.         DO NOT TAKE YOUR empagliflozin (JARDIANCE) 72 HOURS PRIOR TO SURGERY, WITH THE LAST DOSE BEING 08/01/2023.          DO NOT TAKE YOUR  glipiZIDE (GLUCOTROL XL) THE EVENING BEFORE OR THE MORNING OF SURGERY.     The day of surgery, do not take other diabetes injectables, including Byetta (exenatide), Bydureon (exenatide ER), Victoza (liraglutide), or Trulicity (dulaglutide).  If your CBG is greater than 220 mg/dL, you may take  of your sliding scale (correction) dose of insulin.   HOW TO MANAGE YOUR DIABETES BEFORE AND AFTER  SURGERY  Why is it important to control my blood sugar before and after surgery? Improving blood sugar levels before and after surgery helps healing and can limit problems. A way of improving blood sugar control is eating a healthy diet by:  Eating less sugar and carbohydrates  Increasing activity/exercise  Talking with your doctor about reaching your blood sugar goals High blood sugars (greater than 180 mg/dL) can raise your risk of infections and slow your recovery, so you will need to focus on controlling your diabetes during the weeks before surgery. Make sure that the doctor who takes care of your diabetes knows about your planned surgery including the date and location.  How do I manage my blood sugar before surgery? Check your blood sugar at least 4 times a day, starting 2 days before surgery, to make sure that the level is not too high or low.  Check your blood sugar the morning of your surgery when you wake up and every 2 hours until you get to the Short Stay unit.  If your blood sugar is less than 70 mg/dL, you will need to treat for low blood sugar: Do not take insulin. Treat a low blood sugar (less than 70 mg/dL) with  cup of clear juice (cranberry or apple), 4 glucose tablets, OR glucose gel. Recheck blood sugar in 15 minutes after treatment (to make sure it is greater than 70 mg/dL). If your blood sugar is not greater than 70 mg/dL on recheck, call [phone] for further instructions. Report your blood sugar to the short stay nurse when you  get to Short Stay.  If you are admitted to the hospital after surgery: Your blood sugar will be checked by the staff and you will probably be given insulin after surgery (instead of oral diabetes medicines) to make sure you have good blood sugar levels. The goal for blood sugar control after surgery is 80-180 mg/dL.                      Do NOT Smoke (Tobacco/Vaping) for 24 hours prior to your procedure.  If you use a CPAP at night,  you may bring your mask/headgear for your overnight stay.   You will be asked to remove any contacts, glasses, piercing's, hearing aid's, dentures/partials prior to surgery. Please bring cases for these items if needed.    Patients discharged the day of surgery will not be allowed to drive home, and someone needs to stay with them for 24 hours.  SURGICAL WAITING ROOM VISITATION Patients may have no more than 2 support people in the waiting area - these visitors may rotate.   Pre-op nurse will coordinate an appropriate time for 1 ADULT support person, who may not rotate, to accompany patient in pre-op.  Children under the age of 40 must have an adult with them who is not the patient and must remain in the main waiting area with an adult.  If the patient needs to stay at the hospital during part of their recovery, the visitor guidelines for inpatient rooms apply.  Please refer to the [facility] website for the visitor guidelines for any additional information.   If you received a COVID test during your pre-op visit  it is requested that you wear a mask when out in public, stay away from anyone that may not be feeling well and notify your surgeon if you develop symptoms. If you have been in contact with anyone that has tested positive in the last 10 days please notify you surgeon.      Pre-operative CHG Bathing Instructions   You can play a key role in reducing the risk of infection after surgery. Your skin needs to be as free of germs as possible. You can reduce the number of germs on your skin by washing with CHG (chlorhexidine gluconate) soap before surgery. CHG is an antiseptic soap that kills germs and continues to kill germs even after washing.   DO NOT use if you have an allergy to chlorhexidine/CHG or antibacterial soaps. If your skin becomes reddened or irritated, stop using the CHG and notify one of our RNs at [phone].              TAKE A SHOWER THE NIGHT BEFORE SURGERY AND  THE DAY OF SURGERY    Please keep in mind the following:  DO NOT shave, including legs and underarms, 48 hours prior to surgery.   You may shave your face before/day of surgery.  Place clean sheets on your bed the night before surgery Use a clean washcloth (not used since being washed) for each shower. DO NOT sleep with pet's night before surgery.  CHG Shower Instructions:  Wash your face and private area with normal soap. If you choose to wash your hair, wash first with your normal shampoo.  After you use shampoo/soap, rinse your hair and body thoroughly to remove shampoo/soap residue.  Turn the water OFF and apply half the bottle of CHG soap to a CLEAN washcloth.  Apply CHG soap ONLY FROM YOUR NECK DOWN TO YOUR  TOES (washing for 3-5 minutes)  DO NOT use CHG soap on face, private areas, open wounds, or sores.  Pay special attention to the area where your surgery is being performed.  If you are having back surgery, having someone wash your back for you may be helpful. Wait 2 minutes after CHG soap is applied, then you may rinse off the CHG soap.  Pat dry with a clean towel  Put on clean pajamas    Additional instructions for the day of surgery: DO NOT APPLY any lotions, deodorants or cologne   Do not wear jewelry Do not bring valuables to the hospital. [facility] is not responsible for valuables/personal belongings. Put on clean/comfortable clothes.  Please brush your teeth.  Ask your nurse before applying any prescription medications to the skin.

## 2023-07-26 ENCOUNTER — Encounter (HOSPITAL_COMMUNITY)
Admission: RE | Admit: 2023-07-26 | Discharge: 2023-07-26 | Disposition: A | Discharge status: Home / Self Care | Payer: 59 | Source: Ambulatory Visit | Attending: Vascular Surgery | Admitting: Vascular Surgery

## 2023-07-26 ENCOUNTER — Encounter (HOSPITAL_COMMUNITY): Payer: Self-pay

## 2023-07-26 ENCOUNTER — Other Ambulatory Visit: Payer: Self-pay

## 2023-07-26 VITALS — BP 156/73 | HR 73 | Temp 97.8°F | Resp 17 | Ht 71.0 in | Wt 196.5 lb

## 2023-07-26 DIAGNOSIS — I10 Essential (primary) hypertension: Secondary | ICD-10-CM | POA: Diagnosis not present

## 2023-07-26 DIAGNOSIS — I70221 Atherosclerosis of native arteries of extremities with rest pain, right leg: Secondary | ICD-10-CM | POA: Diagnosis not present

## 2023-07-26 DIAGNOSIS — E1151 Type 2 diabetes mellitus with diabetic peripheral angiopathy without gangrene: Secondary | ICD-10-CM | POA: Diagnosis not present

## 2023-07-26 DIAGNOSIS — Z01818 Encounter for other preprocedural examination: Secondary | ICD-10-CM

## 2023-07-26 DIAGNOSIS — I2510 Atherosclerotic heart disease of native coronary artery without angina pectoris: Secondary | ICD-10-CM | POA: Diagnosis not present

## 2023-07-26 DIAGNOSIS — E119 Type 2 diabetes mellitus without complications: Secondary | ICD-10-CM

## 2023-07-26 DIAGNOSIS — F17210 Nicotine dependence, cigarettes, uncomplicated: Secondary | ICD-10-CM | POA: Insufficient documentation

## 2023-07-26 DIAGNOSIS — Z01812 Encounter for preprocedural laboratory examination: Secondary | ICD-10-CM | POA: Diagnosis not present

## 2023-07-26 DIAGNOSIS — R7989 Other specified abnormal findings of blood chemistry: Secondary | ICD-10-CM | POA: Diagnosis not present

## 2023-07-26 DIAGNOSIS — J439 Emphysema, unspecified: Secondary | ICD-10-CM | POA: Diagnosis not present

## 2023-07-26 DIAGNOSIS — K449 Diaphragmatic hernia without obstruction or gangrene: Secondary | ICD-10-CM | POA: Insufficient documentation

## 2023-07-26 DIAGNOSIS — Z951 Presence of aortocoronary bypass graft: Secondary | ICD-10-CM | POA: Insufficient documentation

## 2023-07-26 LAB — SURGICAL PCR SCREEN
MRSA, PCR: NEGATIVE
Staphylococcus aureus: NEGATIVE

## 2023-07-26 LAB — COMPREHENSIVE METABOLIC PANEL
ALT: 18 U/L (ref 0–44)
AST: 22 U/L (ref 15–41)
Albumin: 3.9 g/dL (ref 3.5–5.0)
Alkaline Phosphatase: 71 U/L (ref 38–126)
Anion gap: 8 (ref 5–15)
BUN: 16 mg/dL (ref 8–23)
CO2: 23 mmol/L (ref 22–32)
Calcium: 9.2 mg/dL (ref 8.9–10.3)
Chloride: 103 mmol/L (ref 98–111)
Creatinine, Ser: 1.41 mg/dL — ABNORMAL HIGH (ref 0.61–1.24)
GFR, Estimated: 56 mL/min — ABNORMAL LOW (ref >60)
Glucose, Bld: 221 mg/dL — ABNORMAL HIGH (ref 70–99)
Potassium: 4.2 mmol/L (ref 3.5–5.1)
Sodium: 134 mmol/L — ABNORMAL LOW (ref 135–145)
Total Bilirubin: 0.5 mg/dL (ref <1.2)
Total Protein: 7.4 g/dL (ref 6.5–8.1)

## 2023-07-26 LAB — TYPE AND SCREEN
ABO/RH(D): O POS
Antibody Screen: NEGATIVE

## 2023-07-26 LAB — CBC
HCT: 49.7 % (ref 39.0–52.0)
Hemoglobin: 16.5 g/dL (ref 13.0–17.0)
MCH: 31.3 pg (ref 26.0–34.0)
MCHC: 33.2 g/dL (ref 30.0–36.0)
MCV: 94.3 fL (ref 80.0–100.0)
Platelets: 153 10*3/uL (ref 150–400)
RBC: 5.27 MIL/uL (ref 4.22–5.81)
RDW: 12.4 % (ref 11.5–15.5)
WBC: 10.2 10*3/uL (ref 4.0–10.5)
nRBC: 0 % (ref 0.0–0.2)

## 2023-07-26 LAB — URINALYSIS, ROUTINE W REFLEX MICROSCOPIC
Bacteria, UA: NONE SEEN
Bilirubin Urine: NEGATIVE
Glucose, UA: >=500 mg/dL — AB
Hgb urine dipstick: NEGATIVE
Ketones, ur: NEGATIVE mg/dL
Leukocytes,Ua: NEGATIVE
Nitrite: NEGATIVE
Protein, ur: NEGATIVE mg/dL
Specific Gravity, Urine: 1.017 (ref 1.005–1.030)
pH: 5 (ref 5.0–8.0)

## 2023-07-26 LAB — PROTIME-INR
INR: 1 (ref 0.8–1.2)
Prothrombin Time: 13.3 s (ref 11.4–15.2)

## 2023-07-26 LAB — APTT: aPTT: 33 s (ref 24–36)

## 2023-07-26 LAB — HEMOGLOBIN A1C
Hgb A1c MFr Bld: 7.2 % — ABNORMAL HIGH (ref 4.8–5.6)
Mean Plasma Glucose: 159.94 mg/dL

## 2023-07-26 LAB — GLUCOSE, CAPILLARY: Glucose-Capillary: 214 mg/dL — ABNORMAL HIGH (ref 70–99)

## 2023-07-26 NOTE — Progress Notes (Signed)
Surgical Instructions   Your procedure is scheduled on Monday, August 05, 2023. Report to [facility] : [facility] Main Entrance "A" at 8:35 A.M., then check in with the Admitting office. Any questions or running late day of surgery: call [phone]  Questions prior to your surgery date: call [phone], Monday-Friday, 8am-4pm. If you experience any cold or flu symptoms such as cough, fever, chills, shortness of breath, etc. between now and your scheduled surgery, please notify us at the above number.     Remember:  Do not eat  or drink after midnight the night before your surgery  Take these medicines the morning of surgery with A SIP OF WATER  aspirin EC  gabapentin (NEURONTIN)  icosapent Ethyl (VASCEPA)  isosorbide mononitrate (IMDUR)  metoprolol succinate (TOPROL-XL)  rosuvastatin (CRESTOR)   May take these medicines IF NEEDED: acetaminophen (TYLENOL)  albuterol (VENTOLIN HFA) 108 (90 Base) MCG/ACT inhaler.  Please bring with you to the hospital.   cyclobenzaprine (FLEXERIL)  ondansetron (ZOFRAN)  oxyCODONE-acetaminophen (PERCOCET)    One week prior to surgery, STOP taking any Aleve, Naproxen, Ibuprofen, Motrin, Advil, Goody's, BC's, all herbal medications, fish oil, and non-prescription vitamins.   WHAT DO I DO ABOUT MY DIABETES MEDICATION?   Do not take oral diabetes medicines (pills) the morning of surgery.         DO NOT TAKE YOUR empagliflozin (JARDIANCE) 72 HOURS PRIOR TO SURGERY, WITH THE LAST DOSE BEING 08/01/2023.          DO NOT TAKE YOUR  glipiZIDE (GLUCOTROL XL) THE EVENING BEFORE OR THE MORNING OF SURGERY.     The day of surgery, do not take other diabetes injectables, including Byetta (exenatide), Bydureon (exenatide ER), Victoza (liraglutide), or Trulicity (dulaglutide).  If your CBG is greater than 220 mg/dL, you may take  of your sliding scale (correction) dose of insulin.   HOW TO MANAGE YOUR DIABETES BEFORE AND AFTER SURGERY  Why is it important to  control my blood sugar before and after surgery? Improving blood sugar levels before and after surgery helps healing and can limit problems. A way of improving blood sugar control is eating a healthy diet by:  Eating less sugar and carbohydrates  Increasing activity/exercise  Talking with your doctor about reaching your blood sugar goals High blood sugars (greater than 180 mg/dL) can raise your risk of infections and slow your recovery, so you will need to focus on controlling your diabetes during the weeks before surgery. Make sure that the doctor who takes care of your diabetes knows about your planned surgery including the date and location.  How do I manage my blood sugar before surgery? Check your blood sugar at least 4 times a day, starting 2 days before surgery, to make sure that the level is not too high or low.  Check your blood sugar the morning of your surgery when you wake up and every 2 hours until you get to the Short Stay unit.  If your blood sugar is less than 70 mg/dL, you will need to treat for low blood sugar: Do not take insulin. Treat a low blood sugar (less than 70 mg/dL) with  cup of clear juice (cranberry or apple), 4 glucose tablets, OR glucose gel. Recheck blood sugar in 15 minutes after treatment (to make sure it is greater than 70 mg/dL). If your blood sugar is not greater than 70 mg/dL on recheck, call [phone] for further instructions. Report your blood sugar to the short stay nurse when you get to Short  Stay.  If you are admitted to the hospital after surgery: Your blood sugar will be checked by the staff and you will probably be given insulin after surgery (instead of oral diabetes medicines) to make sure you have good blood sugar levels. The goal for blood sugar control after surgery is 80-180 mg/dL.                      Do NOT Smoke (Tobacco/Vaping) for 24 hours prior to your procedure.  If you use a CPAP at night, you may bring your mask/headgear  for your overnight stay.   You will be asked to remove any contacts, glasses, piercing's, hearing aid's, dentures/partials prior to surgery. Please bring cases for these items if needed.    Patients discharged the day of surgery will not be allowed to drive home, and someone needs to stay with them for 24 hours.  SURGICAL WAITING ROOM VISITATION Patients may have no more than 2 support people in the waiting area - these visitors may rotate.   Pre-op nurse will coordinate an appropriate time for 1 ADULT support person, who may not rotate, to accompany patient in pre-op.  Children under the age of 64 must have an adult with them who is not the patient and must remain in the main waiting area with an adult.  If the patient needs to stay at the hospital during part of their recovery, the visitor guidelines for inpatient rooms apply.  Please refer to the [facility] website for the visitor guidelines for any additional information.   If you received a COVID test during your pre-op visit  it is requested that you wear a mask when out in public, stay away from anyone that may not be feeling well and notify your surgeon if you develop symptoms. If you have been in contact with anyone that has tested positive in the last 10 days please notify you surgeon.      Pre-operative CHG Bathing Instructions   You can play a key role in reducing the risk of infection after surgery. Your skin needs to be as free of germs as possible. You can reduce the number of germs on your skin by washing with CHG (chlorhexidine gluconate) soap before surgery. CHG is an antiseptic soap that kills germs and continues to kill germs even after washing.   DO NOT use if you have an allergy to chlorhexidine/CHG or antibacterial soaps. If your skin becomes reddened or irritated, stop using the CHG and notify one of our RNs at [phone].              TAKE A SHOWER THE NIGHT BEFORE SURGERY AND THE DAY OF SURGERY    Please keep  in mind the following:  DO NOT shave, including legs and underarms, 48 hours prior to surgery.   You may shave your face before/day of surgery.  Place clean sheets on your bed the night before surgery Use a clean washcloth (not used since being washed) for each shower. DO NOT sleep with pet's night before surgery.  CHG Shower Instructions:  Wash your face and private area with normal soap. If you choose to wash your hair, wash first with your normal shampoo.  After you use shampoo/soap, rinse your hair and body thoroughly to remove shampoo/soap residue.  Turn the water OFF and apply half the bottle of CHG soap to a CLEAN washcloth.  Apply CHG soap ONLY FROM YOUR NECK DOWN TO YOUR TOES (washing for  3-5 minutes)  DO NOT use CHG soap on face, private areas, open wounds, or sores.  Pay special attention to the area where your surgery is being performed.  If you are having back surgery, having someone wash your back for you may be helpful. Wait 2 minutes after CHG soap is applied, then you may rinse off the CHG soap.  Pat dry with a clean towel  Put on clean pajamas    Additional instructions for the day of surgery: DO NOT APPLY any lotions, deodorants or cologne   Do not wear jewelry Do not bring valuables to the hospital. [facility] is not responsible for valuables/personal belongings. Put on clean/comfortable clothes.  Please brush your teeth.  Ask your nurse before applying any prescription medications to the skin.

## 2023-07-26 NOTE — Progress Notes (Signed)
PCP - [name], NP Cardiologist - Dr. Nanetta Batty  PPM/ICD - denies   Chest x-ray - 02/22/23 EKG - 02/09/23 Stress Test - 09/10/18 ECHO - 09/10/18 Cardiac Cath - 08/19/12  Sleep Study - denies   Fasting Blood Sugar - 160-170 Checks Blood Sugar once every 2-3 weeks  Last dose of GLP1 agonist-  n/a    Blood Thinner Instructions: pt states he was instructed to hold his Plavix starting 12/25, with the last dose being on 12/25.  Aspirin Instructions: continue  ERAS Protcol - no, NPO   COVID TEST- n/a   Anesthesia review: yes, cardiac hx  Patient denies shortness of breath, fever, cough and chest pain at PAT appointment   All instructions explained to the patient, with a verbal understanding of the material. Patient agrees to go over the instructions while at home for a better understanding.  The opportunity to ask questions was provided.

## 2023-07-29 NOTE — Anesthesia Preprocedure Evaluation (Addendum)
Anesthesia Evaluation  Patient identified by MRN, date of birth, ID band Patient awake    Reviewed: Allergy & Precautions, NPO status , Patient's Chart, lab work & pertinent test results, reviewed documented beta blocker date and time   History of Anesthesia Complications Negative for: history of anesthetic complications  Airway Mallampati: III  TM Distance: >3 FB     Dental  (+) Missing, Poor Dentition   Pulmonary pneumonia, COPD, Current Smoker   breath sounds clear to auscultation       Cardiovascular hypertension, Pt. on medications + CAD, + CABG and + Peripheral Vascular Disease   Rhythm:Regular Rate:Normal     Neuro/Psych  Headaches    GI/Hepatic hiatal hernia,GERD  Medicated,,(+) neg Cirrhosis        Endo/Other  diabetes, Type 2    Renal/GU ARF and Renal InsufficiencyRenal disease     Musculoskeletal  (+) Arthritis ,    Abdominal   Peds  Hematology   Anesthesia Other Findings   Reproductive/Obstetrics                             Anesthesia Physical Anesthesia Plan  ASA: 3  Anesthesia Plan: General   Post-op Pain Management:    Induction: Intravenous  PONV Risk Score and Plan: 2 and Ondansetron and Dexamethasone  Airway Management Planned: Oral ETT  Additional Equipment: Arterial line  Intra-op Plan:   Post-operative Plan: Extubation in OR  Informed Consent: I have reviewed the patients History and Physical, chart, labs and discussed the procedure including the risks, benefits and alternatives for the proposed anesthesia with the patient or authorized representative who has indicated his/her understanding and acceptance.     Dental advisory given  Plan Discussed with: CRNA  Anesthesia Plan Comments: (PAT note written 07/29/2023 by Shonna Chock, PA-C.  )       Anesthesia Quick Evaluation

## 2023-07-29 NOTE — Progress Notes (Signed)
Anesthesia Chart Review:  Case: [id number] Date/Time: 08/05/23 1020   Procedures:      ENDARTERECTOMY FEMORAL (Right)     PATCH ANGIOPLASTY (Right)   Anesthesia type: Choice   Pre-op diagnosis: rest pain and stenosis   [facility] / [facility]   Surgeons: Cephus Shelling, MD       DISCUSSION: Patient is a 62 year old male scheduled for the above procedure.  History includes smoking, COPD, HTN, DM2, CAD (s/p CABG: LIMA-LAD, SVG-DIAG, SVG-Intermediate, SVG-PDA 08/22/12), PAD (s/p left CIA atherectomy/stent 03/30/21; left SFA PTA/DCB 04/12/22; atherectomy/DCB right SFA 04/01/23), cerebral aneurysm (s/p coil emobolization RMCA aneurysm 05/17/22), GERD, hiatal hernia, gout, osteoarthritis (right THA 12/25/22), spinal surgery (C4-6 ACDF 12/19/11; L4-5 fusion 10/30/17).  Last cardiology visit with Dr. Allyson Sabal was on 04/24/23 for follow-up right SFA atherectomy/drug-coated balloon angioplasty on 04/01/23. ABI only increased from 0.55 on the right to 0.66, but calf claudication symptoms hat improved. Still smoking. Six month follow-up planned. Evaluated by Dr. Chestine Spore on 06/11/23 for right foot constant pain and numbness., and CTA Ao+Bifem ordered and done on 07/03/23. Per 07/09/23 follow-up with Dr. Chestine Spore, "CTA confirmed significant right common femoral disease with iliac disease - he also has tibial disease (mult-level as we discussed today).  I again discussed with an ABI of 0.6 I would not expect him to have ischemic rest pain.  He tells me that he cannot live with the symptoms and would be willing to pursue limb amputation given the severity of his discomfort in the foot.  I reviewed his CTA showing multilevel occlusive disease.  Discussed his SFA intervention from Dr. Allyson Sabal remains patent.  From my standpoint discussed I would offer a right common femoral endarterectomy with retrograde iliac stenting."    Cardiology did not recommended preoperative CV testing prior to May 2024 right THA.   He is on  Pletal, Plavix, ASA 81 mg daily. He reported instruction to hold Plavix starting 07/31/2023.  Preoperative labs showed Creatinine 1.41, BUN 16, eGFR 56. (S/p CTA 07/03/23.) K 4.2. A1c 7.2%. Creatinine previously primarily ~ 0.8-0.9 range since 05/2022-06/2023, although Creatinine 1.40 as well on 11/20/22 per labs by Dr. Allyson Sabal and pre-hydration recommended prior to peripheral angiography. Results routed to Dr. Chestine Spore and will tentatively order iSTAT8 for day of surgery to reassess renal function.    VS: BP (!) 156/73   Pulse 73   Temp 36.6 C   Resp 17   Ht 5\' 11"  (1.803 m)   Wt 89.1 kg   SpO2 96%   BMI 27.41 kg/m    PROVIDERS: [name], NP is PCP  Nanetta Batty, MD is cardiologist  Sherald Hess, MD is vascular surgeon Lisbeth Renshaw, MD is neurosurgeon Eulis Canner is urologist   LABS: Preoperative labs noted. See DISCUSSION. A1c 7.2% on 07/26/23.  (all labs ordered are listed, but only abnormal results are displayed)  Labs Reviewed  GLUCOSE, CAPILLARY - Abnormal; Notable for the following components:      Result Value   Glucose-Capillary 214 (*)    All other components within normal limits  COMPREHENSIVE METABOLIC PANEL - Abnormal; Notable for the following components:   Sodium 134 (*)    Glucose, Bld 221 (*)    Creatinine, Ser 1.41 (*)    GFR, Estimated 56 (*)    All other components within normal limits  URINALYSIS, ROUTINE W REFLEX MICROSCOPIC - Abnormal; Notable for the following components:   Color, Urine STRAW (*)    Glucose, UA >=500 (*)  All other components within normal limits  HEMOGLOBIN A1C - Abnormal; Notable for the following components:   Hgb A1c MFr Bld 7.2 (*)    All other components within normal limits  SURGICAL PCR SCREEN  CBC  PROTIME-INR  APTT  TYPE AND SCREEN    IMAGES: CTA Ao+Bifem 07/03/23: IMPRESSION: Vascular Impression: 1. Moderate amount of atherosclerotic plaque within the normal caliber abdominal aorta, not  resulting in a hemodynamically significant stenosis. Aortic Atherosclerosis (ICD10-I70.0). 2. Suspected hemodynamically significant narrowings involving the celiac and SMA. The IMA is diseased though remains patent. Correlation for symptoms of chronic mesenteric ischemia is advised. 3. Suspected hemodynamically significant narrowing of the right renal artery without associated delayed renal enhancement or asymmetric renal atrophy.   Vascular Impression of the right lower extremity: 1. Suspected hemodynamically significant narrowings involving the right common and external iliac arteries. 2. Suspected 50% luminal narrowing of the right superficial femoral artery. 3. Post stenting of the mid and distal aspects of the right superficial femoral artery without evidence of residual or recurrent hemodynamically significant narrowing. 4. Large amount of eccentric predominantly calcified atherosclerotic plaque results in tandem areas of moderate to severe narrowing of both the right above and below-knee popliteal arteries. 5. Short-segment occlusion of the right anterior tibial artery with early reconstitution. Otherwise, three-vessel runoff to the right lower leg and foot. The right-sided dorsalis pedis artery is patent to the level of the forefoot. No evidence of distal embolism.   Vascular Impression of the left lower extremity: 1. No definitive evidence of a hemodynamically significant narrowing affecting the inflow vasculature of the left lower extremity. 2. Approximately 50% luminal narrowing of the left common femoral artery. 3. Mixed calcified and noncalcified atherosclerotic plaque results in tandem areas of at least 50% luminal narrowing within the uncovered portion of the proximal left superficial femoral artery. 4. Post stenting of the mid and distal aspects of the left superficial femoral artery without evidence of a residual or recurrent hemodynamically significant  narrowing. 5. The left above and below-knee popliteal artery is diseased though without a hemodynamically significant narrowing. 6. Three-vessel runoff to the left lower leg and foot. The left-sided dorsalis pedis artery is patent to the level of the forefoot. No discrete intraluminal filling defects to suggest distal embolism.   Nonvascular Impression: 1. Cholelithiasis without evidence of acute cholecystitis. 2. Scattered minimal colonic diverticulosis without evidence of superimposed acute diverticulitis.   CT Chest 06/17/23: IMPRESSION: 1. Resolved left upper lobe/lingular pneumonia. 2. Scattered pulmonary nodules, including a left apical nodule versus area of scarring at up to 6 mm. Non-contrast chest CT at 6-12 months is recommended. If the nodule is stable at time of repeat CT, then future CT at 18-24 months (from today's scan) is considered optional for low-risk patients, but is recommended for high-risk patients. This recommendation follows the consensus statement: Guidelines for Management of Incidental Pulmonary Nodules Detected on CT Images: From the Fleischner Society 2017; Radiology 2017; 284:228-243. 3. Aortic atherosclerosis (ICD10-I70.0) and emphysema (ICD10-J43.9).   EKG: 02/09/2023: Sinus rhythm Prolonged PR interval (PR 222 ms) Nonspecific IVCD with LAD Anteroseptal infarct, old   CV: US Carotid 05/16/2022 Summary:  - Right Carotid: Velocities in the right ICA are consistent with a 1-39% stenosis.  - Left Carotid: Velocities in the left ICA are consistent with a 1-39% stenosis. The ICA and ECA are reversed.  - Vertebrals:  Bilateral vertebral arteries demonstrate antegrade flow.  - Subclavians: Normal flow hemodynamics were seen in bilateral subclavian arteries.    Myocardial  Perfusion 09/10/2018 No diagnostic ST segment changes to indicate ischemia. Very small, mild intensity, apical inferior and anterior defects that are partially reversible and most  consistent with variable soft tissue attenuation rather than ischemia. No large ischemic territories are noted. This is a low risk study. Nuclear stress EF: 70%.    Echo 09/10/2018 1. The left ventricle has normal systolic function of 60-65%. The cavity  size is normal. There is borderline increase in left ventricular wall  thickness. Echo evidence of normal diastolic relaxation.   2. The right ventricle has normal systolic function. The cavity in normal  in size. There is no increase in right ventricular wall thickness. Right  ventricular systolic pressure could not be assessed.   3. The aortic valve is tricuspid. There is mild aortic annular  calcification noted.   4. The mitral valve is normal in structure.   5. The pulmonic valve is grossly normal.    Past Medical History:  Diagnosis Date   Arthritis    "some; in hips sometimes" (01/02/2013)   Back pain    "w/prolonged walking or standing" (01/02/2013)   CAD (coronary artery disease)    CABG X 4 08/2012   COPD (chronic obstructive pulmonary disease) (HCC)    Elevated PSA    Esophageal erosions 05/24/2001   egd by Dr. Jena Gauss   GERD (gastroesophageal reflux disease)    H/O hiatal hernia    Headache    History of gout    Hyperlipidemia    Hypertension    not on any medication for htn at present   PAD (peripheral artery disease) (HCC)    LEA DOPPLER, 11/11/2012 - moderate arterial insufficiency to lower extremities at rest, BILATERAL SFA-demonstrates occlusive disease with reconstitution of flow   S/P percutaneous transluminal angioplasty (PTA) with stent placement 03/29/21 03/31/2021   Tobacco abuse    Tubular adenoma of colon 08/28/2011   Type II diabetes mellitus (HCC)    Unintentional weight loss     Past Surgical History:  Procedure Laterality Date   ABDOMINAL AORTOGRAM W/LOWER EXTREMITY N/A 03/30/2021   Procedure: ABDOMINAL AORTOGRAM W/LOWER EXTREMITY;  Surgeon: Runell Gess, MD;  [facility];   Service: Cardiovascular;  Laterality: N/A;   ABDOMINAL AORTOGRAM W/LOWER EXTREMITY N/A 04/12/2022   Procedure: ABDOMINAL AORTOGRAM W/LOWER EXTREMITY;  Surgeon: Runell Gess, MD;  [facility];  Service: Cardiovascular;  Laterality: N/A;   ABDOMINAL AORTOGRAM W/LOWER EXTREMITY N/A 04/01/2023   Procedure: ABDOMINAL AORTOGRAM W/LOWER EXTREMITY;  Surgeon: Runell Gess, MD;  [facility];  Service: Cardiovascular;  Laterality: N/A;   ANTERIOR CERVICAL DECOMP/DISCECTOMY FUSION  12/19/2011   Procedure: ANTERIOR CERVICAL DECOMPRESSION/DISCECTOMY FUSION 2 LEVELS;  Surgeon: Karn Cassis, MD;  Location: [facility] NEURO [facility]S;  Service: Neurosurgery;  Laterality: N/A;  Cervical four-five Cervical five-six  Anterior cervical decompression/diskectomy, fusion, plate   ATHERECTOMY N/A 01/02/2013   Procedure: ATHERECTOMY;  Surgeon: Runell Gess, MD;  Location: [facility] CATH LAB;  Service: Cardiovascular;  Laterality: N/A;   BACK SURGERY     x4   CARDIAC CATHETERIZATION  08/19/2012   CABG warranted; Severe ostial LCx stenosis with mid to distal vessel occlusion   COLONOSCOPY W/ POLYPECTOMY  08/28/2011   Rourk-tubular adenomas removed from ascending colon, suboptimal prep, diminutive rectal polyps   COLONOSCOPY WITH PROPOFOL N/A 06/13/2017   Procedure: COLONOSCOPY WITH PROPOFOL;  Surgeon: Corbin Ade, MD;  Location: [facility];  Service: Endoscopy;  Laterality: N/A;  9:15am  CORONARY ARTERY BYPASS GRAFT  08/22/2012   Procedure: CORONARY ARTERY BYPASS GRAFTING (CABG);  Surgeon: Delight Ovens, MD;  Location: [facility] OR;  Service: Open Heart Surgery;  Laterality: N/A;  CABG x four,  using left internal mammary artery and right leg greater saphenous vein harvested endoscopically   ESOPHAGOGASTRODUODENOSCOPY  05/24/2001   by Dr. Jena Gauss   ESOPHAGOGASTRODUODENOSCOPY  08/28/2011   Rourk-erosive reflux esophagitis, gastric erosions   ESOPHAGOGASTRODUODENOSCOPY (EGD) WITH PROPOFOL  N/A 06/13/2017   Procedure: ESOPHAGOGASTRODUODENOSCOPY (EGD) WITH PROPOFOL;  Surgeon: Corbin Ade, MD;  Location: [facility];  Service: Endoscopy;  Laterality: N/A;   INTRAOPERATIVE TRANSESOPHAGEAL ECHOCARDIOGRAM  08/22/2012   Procedure: INTRAOPERATIVE TRANSESOPHAGEAL ECHOCARDIOGRAM;  Surgeon: Delight Ovens, MD;  Location: [facility] OR;  Service: Open Heart Surgery;  Laterality: N/A;   IR 3D INDEPENDENT WKST  05/17/2022   IR ANGIO INTRA EXTRACRAN SEL INTERNAL CAROTID UNI R MOD SED  05/17/2022   IR ANGIO INTRA EXTRACRAN SEL INTERNAL CAROTID UNI R MOD SED  12/04/2022   IR ANGIOGRAM FOLLOW UP STUDY  05/17/2022   IR NEURO EACH ADD'L AFTER BASIC UNI RIGHT (MS)  05/17/2022   IR TRANSCATH/EMBOLIZ  05/17/2022   IR US GUIDE VASC ACCESS RIGHT  05/17/2022   IR US GUIDE VASC ACCESS RIGHT  12/04/2022   LEFT HEART CATHETERIZATION WITH CORONARY ANGIOGRAM N/A 08/19/2012   Procedure: LEFT HEART CATHETERIZATION WITH CORONARY ANGIOGRAM;  Surgeon: Chrystie Nose, MD;  [facility] CATH LAB;  Service: Cardiovascular;  Laterality: N/A;   LOWER EXTREMITY ANGIOGRAM N/A 11/17/2012   Procedure: LOWER EXTREMITY ANGIOGRAM;  Surgeon: Runell Gess, MD;  Location: [facility] CATH LAB;  Service: Cardiovascular;  Laterality: N/A;   LOWER EXTREMITY ANGIOGRAM N/A 02/02/2013   Procedure: LOWER EXTREMITY ANGIOGRAM;  Surgeon: Runell Gess, MD;  Location: [facility] CATH LAB;  Service: Cardiovascular;  Laterality: N/A;   LUMBAR DISC SURGERY     LUMBAR FUSION  ~ 2011   PELVIC ABCESS DRAINAGE     x2   PERIPHERAL VASCULAR ATHERECTOMY  03/30/2021   Procedure: PERIPHERAL VASCULAR ATHERECTOMY;  Surgeon: Runell Gess, MD;  Location: [facility] INVASIVE CV LAB;  Service: Cardiovascular;;  left common iliac artery   PERIPHERAL VASCULAR ATHERECTOMY  04/01/2023   Procedure: PERIPHERAL VASCULAR ATHERECTOMY;  Surgeon: Runell Gess, MD;  Location: [facility] INVASIVE CV LAB;  Service: Cardiovascular;;   PERIPHERAL VASCULAR BALLOON ANGIOPLASTY Left  04/12/2022   Procedure: PERIPHERAL VASCULAR BALLOON ANGIOPLASTY;  Surgeon: Runell Gess, MD;  [facility];  Service: Cardiovascular;  Laterality: Left;  SFA   PERIPHERAL VASCULAR BALLOON ANGIOPLASTY Right 04/01/2023   Procedure: PERIPHERAL VASCULAR BALLOON ANGIOPLASTY;  Surgeon: Runell Gess, MD;  [facility] CV LAB;  Service: Cardiovascular;  Laterality: Right;  DCB - SFA   PERIPHERAL VASCULAR INTERVENTION  03/30/2021   Procedure: PERIPHERAL VASCULAR INTERVENTION;  Surgeon: Runell Gess, MD;  [facility] CV LAB;  Service: Cardiovascular;;   POLYPECTOMY  06/13/2017   Procedure: POLYPECTOMY;  Surgeon: Corbin Ade, MD;  Location: [facility];  Service: Endoscopy;;  colon   RADIOLOGY WITH ANESTHESIA N/A 05/17/2022   Procedure: Diagnostic Cerebral Angiogram, Possible Aneurysm Coiling, Possible Stent;  Surgeon: Lisbeth Renshaw, MD;  Location: [facility] OR;  Service: Radiology;  Laterality: N/A;   SHOULDER SURGERY Right    TOTAL HIP ARTHROPLASTY Right 12/25/2022   Procedure: TOTAL HIP ARTHROPLASTY;  Surgeon: Teryl Lucy, MD;  Location: [facility] ORS;  Service: Orthopedics;  Laterality: Right;   TOTAL HIP  ARTHROPLASTY Right    VASCULAR SURGERY Right 01/02/2013   diamondback orbital rotational  atherectomy, chocolate balloon and IDEV  Stent.    MEDICATIONS:  acetaminophen (TYLENOL) 650 MG CR tablet   albuterol (VENTOLIN HFA) 108 (90 Base) MCG/ACT inhaler   aspirin EC 81 MG EC tablet   Cholecalciferol (VITAMIN D-3 PO)   cilostazol (PLETAL) 100 MG tablet   clopidogrel (PLAVIX) 75 MG tablet   cyclobenzaprine (FLEXERIL) 10 MG tablet   empagliflozin (JARDIANCE) 25 MG TABS tablet   folic acid (FOLVITE) 1 MG tablet   gabapentin (NEURONTIN) 600 MG tablet   gabapentin (NEURONTIN) 800 MG tablet   glipiZIDE (GLUCOTROL XL) 10 MG 24 hr tablet   icosapent Ethyl (VASCEPA) 1 g capsule   isosorbide mononitrate (IMDUR) 60 MG 24 hr tablet   losartan (COZAAR) 50  MG tablet   metoprolol succinate (TOPROL-XL) 25 MG 24 hr tablet   ondansetron (ZOFRAN) 4 MG tablet   oxyCODONE-acetaminophen (PERCOCET) 10-325 MG tablet   rosuvastatin (CRESTOR) 40 MG tablet   thiamine (VITAMIN B-1) 100 MG tablet   zolpidem (AMBIEN CR) 12.5 MG CR tablet   No current facility-administered medications for this encounter.   Jardiance to be held after 08/01/23 dose.    Shonna Chock, PA-C Surgical Short Stay/Anesthesiology [facility] Phone [phone] [facility] Phone [phone] 07/29/2023 3:07 PM

## 2023-08-05 ENCOUNTER — Encounter (HOSPITAL_COMMUNITY)
Admission: RE | Disposition: A | Discharge status: Home Health | Payer: Self-pay | Source: Home / Self Care | Attending: Vascular Surgery

## 2023-08-05 ENCOUNTER — Other Ambulatory Visit: Payer: Self-pay

## 2023-08-05 ENCOUNTER — Inpatient Hospital Stay (HOSPITAL_COMMUNITY): Payer: 59 | Admitting: Vascular Surgery

## 2023-08-05 ENCOUNTER — Encounter (HOSPITAL_COMMUNITY): Payer: Self-pay | Admitting: Vascular Surgery

## 2023-08-05 ENCOUNTER — Inpatient Hospital Stay (HOSPITAL_COMMUNITY): Payer: 59

## 2023-08-05 ENCOUNTER — Inpatient Hospital Stay (HOSPITAL_COMMUNITY)
Admission: RE | Admit: 2023-08-05 | Discharge: 2023-08-06 | DRG: 254 | Disposition: A | Discharge status: Home Health | Payer: 59 | Attending: Vascular Surgery | Admitting: Vascular Surgery

## 2023-08-05 DIAGNOSIS — I739 Peripheral vascular disease, unspecified: Secondary | ICD-10-CM | POA: Diagnosis present

## 2023-08-05 DIAGNOSIS — F17210 Nicotine dependence, cigarettes, uncomplicated: Secondary | ICD-10-CM | POA: Diagnosis present

## 2023-08-05 DIAGNOSIS — Z951 Presence of aortocoronary bypass graft: Secondary | ICD-10-CM

## 2023-08-05 DIAGNOSIS — Z95828 Presence of other vascular implants and grafts: Secondary | ICD-10-CM

## 2023-08-05 DIAGNOSIS — I2510 Atherosclerotic heart disease of native coronary artery without angina pectoris: Secondary | ICD-10-CM | POA: Diagnosis present

## 2023-08-05 DIAGNOSIS — J449 Chronic obstructive pulmonary disease, unspecified: Secondary | ICD-10-CM | POA: Diagnosis present

## 2023-08-05 DIAGNOSIS — I10 Essential (primary) hypertension: Secondary | ICD-10-CM

## 2023-08-05 DIAGNOSIS — Z981 Arthrodesis status: Secondary | ICD-10-CM | POA: Diagnosis not present

## 2023-08-05 DIAGNOSIS — Z7902 Long term (current) use of antithrombotics/antiplatelets: Secondary | ICD-10-CM

## 2023-08-05 DIAGNOSIS — I70221 Atherosclerosis of native arteries of extremities with rest pain, right leg: Principal | ICD-10-CM | POA: Diagnosis present

## 2023-08-05 DIAGNOSIS — Z96641 Presence of right artificial hip joint: Secondary | ICD-10-CM | POA: Diagnosis present

## 2023-08-05 DIAGNOSIS — Z7984 Long term (current) use of oral hypoglycemic drugs: Secondary | ICD-10-CM

## 2023-08-05 DIAGNOSIS — E1151 Type 2 diabetes mellitus with diabetic peripheral angiopathy without gangrene: Secondary | ICD-10-CM | POA: Diagnosis present

## 2023-08-05 DIAGNOSIS — Z860101 Personal history of adenomatous and serrated colon polyps: Secondary | ICD-10-CM | POA: Diagnosis not present

## 2023-08-05 DIAGNOSIS — E119 Type 2 diabetes mellitus without complications: Secondary | ICD-10-CM | POA: Diagnosis not present

## 2023-08-05 DIAGNOSIS — I70229 Atherosclerosis of native arteries of extremities with rest pain, unspecified extremity: Secondary | ICD-10-CM | POA: Diagnosis present

## 2023-08-05 DIAGNOSIS — Z79899 Other long term (current) drug therapy: Secondary | ICD-10-CM

## 2023-08-05 DIAGNOSIS — M109 Gout, unspecified: Secondary | ICD-10-CM | POA: Diagnosis present

## 2023-08-05 DIAGNOSIS — M160 Bilateral primary osteoarthritis of hip: Secondary | ICD-10-CM | POA: Diagnosis present

## 2023-08-05 DIAGNOSIS — Z7982 Long term (current) use of aspirin: Secondary | ICD-10-CM | POA: Diagnosis not present

## 2023-08-05 DIAGNOSIS — E785 Hyperlipidemia, unspecified: Secondary | ICD-10-CM | POA: Diagnosis present

## 2023-08-05 DIAGNOSIS — K219 Gastro-esophageal reflux disease without esophagitis: Secondary | ICD-10-CM | POA: Diagnosis present

## 2023-08-05 DIAGNOSIS — I70201 Unspecified atherosclerosis of native arteries of extremities, right leg: Secondary | ICD-10-CM

## 2023-08-05 DIAGNOSIS — Z01818 Encounter for other preprocedural examination: Secondary | ICD-10-CM

## 2023-08-05 DIAGNOSIS — R7989 Other specified abnormal findings of blood chemistry: Secondary | ICD-10-CM

## 2023-08-05 HISTORY — PX: ENDARTERECTOMY FEMORAL: SHX5804

## 2023-08-05 HISTORY — PX: PATCH ANGIOPLASTY: SHX6230

## 2023-08-05 HISTORY — PX: INSERTION OF ILIAC STENT: SHX6256

## 2023-08-05 LAB — BASIC METABOLIC PANEL
Anion gap: 8 (ref 5–15)
BUN: 14 mg/dL (ref 8–23)
CO2: 24 mmol/L (ref 22–32)
Calcium: 9.2 mg/dL (ref 8.9–10.3)
Chloride: 104 mmol/L (ref 98–111)
Creatinine, Ser: 0.85 mg/dL (ref 0.61–1.24)
GFR, Estimated: >60 mL/min (ref >60)
Glucose, Bld: 192 mg/dL — ABNORMAL HIGH (ref 70–99)
Potassium: 4.8 mmol/L (ref 3.5–5.1)
Sodium: 136 mmol/L (ref 135–145)

## 2023-08-05 LAB — CBC
HCT: 42.8 % (ref 39.0–52.0)
Hemoglobin: 14.2 g/dL (ref 13.0–17.0)
MCH: 31.2 pg (ref 26.0–34.0)
MCHC: 33.2 g/dL (ref 30.0–36.0)
MCV: 94.1 fL (ref 80.0–100.0)
Platelets: 124 10*3/uL — ABNORMAL LOW (ref 150–400)
RBC: 4.55 MIL/uL (ref 4.22–5.81)
RDW: 12.9 % (ref 11.5–15.5)
WBC: 9 10*3/uL (ref 4.0–10.5)
nRBC: 0 % (ref 0.0–0.2)

## 2023-08-05 LAB — GLUCOSE, CAPILLARY
Glucose-Capillary: 210 mg/dL — ABNORMAL HIGH (ref 70–99)
Glucose-Capillary: 133 mg/dL — ABNORMAL HIGH (ref 70–99)
Glucose-Capillary: 169 mg/dL — ABNORMAL HIGH (ref 70–99)
Glucose-Capillary: 397 mg/dL — ABNORMAL HIGH (ref 70–99)

## 2023-08-05 LAB — CREATININE, SERUM
Creatinine, Ser: 0.89 mg/dL (ref 0.61–1.24)
GFR, Estimated: >60 mL/min (ref >60)

## 2023-08-05 SURGERY — ENDARTERECTOMY, FEMORAL
Anesthesia: General | Site: Groin | Laterality: Right

## 2023-08-05 MED ORDER — LIDOCAINE 2% (20 MG/ML) 5 ML SYRINGE
INTRAMUSCULAR | Status: AC
  Filled 2023-08-05: qty 5

## 2023-08-05 MED ORDER — CHLORHEXIDINE GLUCONATE CLOTH 2 % EX PADS: 6.0000 | MEDICATED_PAD | Freq: Once | CUTANEOUS | Status: DC

## 2023-08-05 MED ORDER — GLIPIZIDE ER 10 MG PO TB24: 10.0000 mg | ORAL_TABLET | Freq: Two times a day (BID) | ORAL | Status: DC

## 2023-08-05 MED ORDER — POLYETHYLENE GLYCOL 3350 17 G PO PACK: 17.0000 g | PACK | Freq: Every day | ORAL | Status: DC | PRN

## 2023-08-05 MED ORDER — PROPOFOL 10 MG/ML IV BOLUS
INTRAVENOUS | Status: DC | PRN
  Administered 2023-08-05: 120 mg via INTRAVENOUS

## 2023-08-05 MED ORDER — DEXAMETHASONE SODIUM PHOSPHATE 10 MG/ML IJ SOLN
INTRAMUSCULAR | Status: DC | PRN
  Administered 2023-08-05: 5 mg via INTRAVENOUS

## 2023-08-05 MED ORDER — FENTANYL CITRATE (PF) 250 MCG/5ML IJ SOLN
INTRAMUSCULAR | Status: AC
  Filled 2023-08-05: qty 5

## 2023-08-05 MED ORDER — HEMOSTATIC AGENTS (NO CHARGE) OPTIME
TOPICAL | Status: DC | PRN
  Administered 2023-08-05: 1 via TOPICAL

## 2023-08-05 MED ORDER — PHENYLEPHRINE HCL-NACL 20-0.9 MG/250ML-% IV SOLN
INTRAVENOUS | Status: DC | PRN
  Administered 2023-08-05: 25 ug/min via INTRAVENOUS

## 2023-08-05 MED ORDER — INSULIN ASPART 100 UNIT/ML IJ SOLN: 0.0000 [IU] | INTRAMUSCULAR | Status: DC | PRN

## 2023-08-05 MED ORDER — ALUM & MAG HYDROXIDE-SIMETH 200-200-20 MG/5ML PO SUSP: 15.0000 mL | ORAL | Status: DC | PRN

## 2023-08-05 MED ORDER — ACETAMINOPHEN 650 MG RE SUPP: 325.0000 mg | RECTAL | Status: DC | PRN

## 2023-08-05 MED ORDER — SODIUM CHLORIDE 0.9 % IV SOLN: INTRAVENOUS | Status: DC | PRN

## 2023-08-05 MED ORDER — ZOLPIDEM TARTRATE 5 MG PO TABS
10.0000 mg | ORAL_TABLET | Freq: Every evening | ORAL | Status: DC | PRN
  Administered 2023-08-05: 10 mg via ORAL
  Filled 2023-08-05: qty 2

## 2023-08-05 MED ORDER — ROCURONIUM BROMIDE 10 MG/ML (PF) SYRINGE
PREFILLED_SYRINGE | INTRAVENOUS | Status: DC | PRN
  Administered 2023-08-05: 80 mg via INTRAVENOUS
  Administered 2023-08-05: 10 mg via INTRAVENOUS

## 2023-08-05 MED ORDER — INSULIN ASPART 100 UNIT/ML IJ SOLN
INTRAMUSCULAR | Status: AC
  Administered 2023-08-05: 4 [IU] via SUBCUTANEOUS
  Filled 2023-08-05: qty 1

## 2023-08-05 MED ORDER — METOPROLOL SUCCINATE ER 25 MG PO TB24
25.0000 mg | ORAL_TABLET | Freq: Every day | ORAL | Status: DC
  Administered 2023-08-06: 25 mg via ORAL
  Filled 2023-08-05: qty 1

## 2023-08-05 MED ORDER — CEFAZOLIN SODIUM-DEXTROSE 2-4 GM/100ML-% IV SOLN
2.0000 g | INTRAVENOUS | Status: AC
Start: 2023-08-05 — End: 2023-08-05
  Administered 2023-08-05: 2 g via INTRAVENOUS
  Filled 2023-08-05: qty 100

## 2023-08-05 MED ORDER — MAGNESIUM SULFATE 2 GM/50ML IV SOLN: 2.0000 g | Freq: Every day | INTRAVENOUS | Status: DC | PRN

## 2023-08-05 MED ORDER — EPHEDRINE SULFATE-NACL 50-0.9 MG/10ML-% IV SOSY
PREFILLED_SYRINGE | INTRAVENOUS | Status: DC | PRN
  Administered 2023-08-05: 5 mg via INTRAVENOUS

## 2023-08-05 MED ORDER — GLIPIZIDE ER 10 MG PO TB24
10.0000 mg | ORAL_TABLET | Freq: Two times a day (BID) | ORAL | Status: DC
  Administered 2023-08-05 – 2023-08-06 (×2): 10 mg via ORAL
  Filled 2023-08-05 (×2): qty 1

## 2023-08-05 MED ORDER — ROCURONIUM BROMIDE 10 MG/ML (PF) SYRINGE
PREFILLED_SYRINGE | INTRAVENOUS | Status: AC
  Filled 2023-08-05: qty 10

## 2023-08-05 MED ORDER — FOLIC ACID 1 MG PO TABS
1.0000 mg | ORAL_TABLET | Freq: Every day | ORAL | Status: DC
  Administered 2023-08-05 – 2023-08-06 (×2): 1 mg via ORAL
  Filled 2023-08-05 (×2): qty 1

## 2023-08-05 MED ORDER — HEPARIN SODIUM (PORCINE) 5000 UNIT/ML IJ SOLN
5000.0000 [IU] | Freq: Three times a day (TID) | INTRAMUSCULAR | Status: DC
  Administered 2023-08-06: 5000 [IU] via SUBCUTANEOUS
  Filled 2023-08-05: qty 1

## 2023-08-05 MED ORDER — PROPOFOL 10 MG/ML IV BOLUS
INTRAVENOUS | Status: AC
  Filled 2023-08-05: qty 20

## 2023-08-05 MED ORDER — ONDANSETRON HCL 4 MG/2ML IJ SOLN
4.0000 mg | Freq: Four times a day (QID) | INTRAMUSCULAR | Status: DC | PRN
Start: 2023-08-05 — End: 2023-08-06

## 2023-08-05 MED ORDER — CYCLOBENZAPRINE HCL 10 MG PO TABS: 10.0000 mg | ORAL_TABLET | Freq: Three times a day (TID) | ORAL | Status: DC | PRN

## 2023-08-05 MED ORDER — MIDAZOLAM HCL 2 MG/2ML IJ SOLN
INTRAMUSCULAR | Status: AC
Start: 2023-08-05 — End: ?
  Filled 2023-08-05: qty 2

## 2023-08-05 MED ORDER — OXYCODONE HCL 5 MG PO TABS: 5.0000 mg | ORAL_TABLET | Freq: Once | ORAL | Status: DC | PRN

## 2023-08-05 MED ORDER — HEPARIN 6000 UNIT IRRIGATION SOLUTION
Status: DC | PRN
  Administered 2023-08-05: 1

## 2023-08-05 MED ORDER — CHLORHEXIDINE GLUCONATE 0.12 % MT SOLN
15.0000 mL | Freq: Once | OROMUCOSAL | Status: AC
  Administered 2023-08-05: 15 mL via OROMUCOSAL
  Filled 2023-08-05: qty 15

## 2023-08-05 MED ORDER — ALBUMIN HUMAN 5 % IV SOLN: INTRAVENOUS | Status: DC | PRN

## 2023-08-05 MED ORDER — DEXAMETHASONE SODIUM PHOSPHATE 10 MG/ML IJ SOLN
INTRAMUSCULAR | Status: AC
  Filled 2023-08-05: qty 1

## 2023-08-05 MED ORDER — ALBUTEROL SULFATE HFA 108 (90 BASE) MCG/ACT IN AERS: 2.0000 | INHALATION_SPRAY | Freq: Four times a day (QID) | RESPIRATORY_TRACT | Status: DC | PRN

## 2023-08-05 MED ORDER — BISACODYL 10 MG RE SUPP: 10.0000 mg | Freq: Every day | RECTAL | Status: DC | PRN

## 2023-08-05 MED ORDER — SODIUM CHLORIDE 0.9 % IV SOLN: 500.0000 mL | Freq: Once | INTRAVENOUS | Status: DC | PRN

## 2023-08-05 MED ORDER — ORAL CARE MOUTH RINSE: 15.0000 mL | Freq: Once | OROMUCOSAL | Status: AC

## 2023-08-05 MED ORDER — ICOSAPENT ETHYL 1 G PO CAPS
2.0000 g | ORAL_CAPSULE | Freq: Two times a day (BID) | ORAL | Status: DC
  Administered 2023-08-05 – 2023-08-06 (×2): 2 g via ORAL
  Filled 2023-08-05 (×2): qty 2

## 2023-08-05 MED ORDER — METOPROLOL TARTRATE 5 MG/5ML IV SOLN: 2.0000 mg | INTRAVENOUS | Status: DC | PRN

## 2023-08-05 MED ORDER — ISOSORBIDE MONONITRATE ER 60 MG PO TB24
60.0000 mg | ORAL_TABLET | Freq: Every day | ORAL | Status: DC
  Administered 2023-08-06: 60 mg via ORAL
  Filled 2023-08-05: qty 1

## 2023-08-05 MED ORDER — ASPIRIN 81 MG PO TBEC
81.0000 mg | DELAYED_RELEASE_TABLET | Freq: Every day | ORAL | Status: DC
  Administered 2023-08-06: 81 mg via ORAL
  Filled 2023-08-05: qty 1

## 2023-08-05 MED ORDER — CEFAZOLIN SODIUM-DEXTROSE 2-4 GM/100ML-% IV SOLN
2.0000 g | Freq: Three times a day (TID) | INTRAVENOUS | Status: AC
  Administered 2023-08-05 – 2023-08-06 (×2): 2 g via INTRAVENOUS
  Filled 2023-08-05 (×2): qty 100

## 2023-08-05 MED ORDER — OXYCODONE-ACETAMINOPHEN 10-325 MG PO TABS: 1.0000 | ORAL_TABLET | Freq: Four times a day (QID) | ORAL | Status: DC | PRN

## 2023-08-05 MED ORDER — ACETAMINOPHEN 325 MG PO TABS: 325.0000 mg | ORAL_TABLET | ORAL | Status: DC | PRN

## 2023-08-05 MED ORDER — MORPHINE SULFATE (PF) 2 MG/ML IV SOLN
2.0000 mg | INTRAVENOUS | Status: DC | PRN
  Administered 2023-08-05: 2 mg via INTRAVENOUS
  Filled 2023-08-05: qty 1

## 2023-08-05 MED ORDER — GUAIFENESIN-DM 100-10 MG/5ML PO SYRP: 15.0000 mL | ORAL_SOLUTION | ORAL | Status: DC | PRN

## 2023-08-05 MED ORDER — HEPARIN 6000 UNIT IRRIGATION SOLUTION
Status: AC
  Filled 2023-08-05: qty 500

## 2023-08-05 MED ORDER — PHENYLEPHRINE 80 MCG/ML (10ML) SYRINGE FOR IV PUSH (FOR BLOOD PRESSURE SUPPORT)
PREFILLED_SYRINGE | INTRAVENOUS | Status: DC | PRN
  Administered 2023-08-05 (×4): 80 ug via INTRAVENOUS

## 2023-08-05 MED ORDER — LABETALOL HCL 5 MG/ML IV SOLN: 10.0000 mg | INTRAVENOUS | Status: DC | PRN

## 2023-08-05 MED ORDER — SODIUM CHLORIDE 0.9 % IV SOLN: INTRAVENOUS | Status: DC

## 2023-08-05 MED ORDER — OXYCODONE-ACETAMINOPHEN 5-325 MG PO TABS
1.0000 | ORAL_TABLET | Freq: Four times a day (QID) | ORAL | Status: DC | PRN
  Administered 2023-08-05 – 2023-08-06 (×2): 1 via ORAL
  Filled 2023-08-05 (×2): qty 1

## 2023-08-05 MED ORDER — PANTOPRAZOLE SODIUM 40 MG PO TBEC
40.0000 mg | DELAYED_RELEASE_TABLET | Freq: Every day | ORAL | Status: DC
  Administered 2023-08-05 – 2023-08-06 (×2): 40 mg via ORAL
  Filled 2023-08-05 (×2): qty 1

## 2023-08-05 MED ORDER — CLOPIDOGREL BISULFATE 75 MG PO TABS
75.0000 mg | ORAL_TABLET | Freq: Every day | ORAL | Status: DC
  Administered 2023-08-06: 75 mg via ORAL
  Filled 2023-08-05: qty 1

## 2023-08-05 MED ORDER — PHENYLEPHRINE 80 MCG/ML (10ML) SYRINGE FOR IV PUSH (FOR BLOOD PRESSURE SUPPORT)
PREFILLED_SYRINGE | INTRAVENOUS | Status: AC
  Filled 2023-08-05: qty 10

## 2023-08-05 MED ORDER — MIDAZOLAM HCL 2 MG/2ML IJ SOLN
INTRAMUSCULAR | Status: DC | PRN
  Administered 2023-08-05: 2 mg via INTRAVENOUS

## 2023-08-05 MED ORDER — ROSUVASTATIN CALCIUM 20 MG PO TABS
40.0000 mg | ORAL_TABLET | Freq: Every day | ORAL | Status: DC
  Administered 2023-08-05 – 2023-08-06 (×2): 40 mg via ORAL
  Filled 2023-08-05 (×2): qty 2

## 2023-08-05 MED ORDER — EPHEDRINE 5 MG/ML INJ
INTRAVENOUS | Status: AC
  Filled 2023-08-05: qty 5

## 2023-08-05 MED ORDER — LOSARTAN POTASSIUM 50 MG PO TABS
50.0000 mg | ORAL_TABLET | Freq: Every day | ORAL | Status: DC
  Administered 2023-08-05 – 2023-08-06 (×2): 50 mg via ORAL
  Filled 2023-08-05 (×2): qty 1

## 2023-08-05 MED ORDER — ONDANSETRON HCL 4 MG/2ML IJ SOLN
INTRAMUSCULAR | Status: DC | PRN
  Administered 2023-08-05: 4 mg via INTRAVENOUS

## 2023-08-05 MED ORDER — LIDOCAINE 2% (20 MG/ML) 5 ML SYRINGE
INTRAMUSCULAR | Status: DC | PRN
  Administered 2023-08-05: 100 mg via INTRAVENOUS

## 2023-08-05 MED ORDER — 0.9 % SODIUM CHLORIDE (POUR BTL) OPTIME
TOPICAL | Status: DC | PRN
  Administered 2023-08-05: 1000 mL

## 2023-08-05 MED ORDER — IODIXANOL 320 MG/ML IV SOLN
INTRAVENOUS | Status: DC | PRN
  Administered 2023-08-05: 25 mL

## 2023-08-05 MED ORDER — ONDANSETRON HCL 4 MG/2ML IJ SOLN: 4.0000 mg | Freq: Once | INTRAMUSCULAR | Status: DC | PRN

## 2023-08-05 MED ORDER — PHENOL 1.4 % MT LIQD: 1.0000 | OROMUCOSAL | Status: DC | PRN

## 2023-08-05 MED ORDER — FENTANYL CITRATE (PF) 250 MCG/5ML IJ SOLN
INTRAMUSCULAR | Status: DC | PRN
  Administered 2023-08-05: 100 ug via INTRAVENOUS
  Administered 2023-08-05: 50 ug via INTRAVENOUS
  Administered 2023-08-05: 100 ug via INTRAVENOUS

## 2023-08-05 MED ORDER — PROTAMINE SULFATE 10 MG/ML IV SOLN
INTRAVENOUS | Status: DC | PRN
  Administered 2023-08-05 (×3): 10 mg via INTRAVENOUS

## 2023-08-05 MED ORDER — OXYCODONE HCL 5 MG PO TABS
5.0000 mg | ORAL_TABLET | Freq: Four times a day (QID) | ORAL | Status: DC | PRN
  Administered 2023-08-05 – 2023-08-06 (×2): 5 mg via ORAL
  Filled 2023-08-05 (×2): qty 1

## 2023-08-05 MED ORDER — SUGAMMADEX SODIUM 200 MG/2ML IV SOLN
INTRAVENOUS | Status: DC | PRN
  Administered 2023-08-05: 200 mg via INTRAVENOUS

## 2023-08-05 MED ORDER — INSULIN ASPART 100 UNIT/ML IJ SOLN
0.0000 [IU] | Freq: Three times a day (TID) | INTRAMUSCULAR | Status: DC
  Administered 2023-08-05: 3 [IU] via SUBCUTANEOUS
  Administered 2023-08-06: 15 [IU] via SUBCUTANEOUS

## 2023-08-05 MED ORDER — LACTATED RINGERS IV SOLN: INTRAVENOUS | Status: DC

## 2023-08-05 MED ORDER — GABAPENTIN 400 MG PO CAPS
800.0000 mg | ORAL_CAPSULE | Freq: Three times a day (TID) | ORAL | Status: DC
  Administered 2023-08-05 – 2023-08-06 (×3): 800 mg via ORAL
  Filled 2023-08-05 (×3): qty 2

## 2023-08-05 MED ORDER — OXYCODONE HCL 5 MG/5ML PO SOLN: 5.0000 mg | Freq: Once | ORAL | Status: DC | PRN

## 2023-08-05 MED ORDER — HEPARIN SODIUM (PORCINE) 1000 UNIT/ML IJ SOLN
INTRAMUSCULAR | Status: DC | PRN
  Administered 2023-08-05: 9000 [IU] via INTRAVENOUS

## 2023-08-05 MED ORDER — POTASSIUM CHLORIDE CRYS ER 20 MEQ PO TBCR: 20.0000 meq | EXTENDED_RELEASE_TABLET | Freq: Every day | ORAL | Status: DC | PRN

## 2023-08-05 MED ORDER — ACETAMINOPHEN 10 MG/ML IV SOLN
1000.0000 mg | Freq: Once | INTRAVENOUS | Status: DC | PRN
Start: 2023-08-05 — End: 2023-08-05

## 2023-08-05 MED ORDER — DOCUSATE SODIUM 100 MG PO CAPS
100.0000 mg | ORAL_CAPSULE | Freq: Every day | ORAL | Status: DC
  Administered 2023-08-06: 100 mg via ORAL
  Filled 2023-08-05: qty 1

## 2023-08-05 MED ORDER — HYDRALAZINE HCL 20 MG/ML IJ SOLN: 5.0000 mg | INTRAMUSCULAR | Status: DC | PRN

## 2023-08-05 MED ORDER — ONDANSETRON HCL 4 MG/2ML IJ SOLN
INTRAMUSCULAR | Status: AC
  Filled 2023-08-05: qty 2

## 2023-08-05 MED ORDER — FENTANYL CITRATE (PF) 100 MCG/2ML IJ SOLN: 25.0000 ug | INTRAMUSCULAR | Status: DC | PRN

## 2023-08-05 MED ORDER — ONDANSETRON HCL 4 MG PO TABS: 4.0000 mg | ORAL_TABLET | Freq: Three times a day (TID) | ORAL | Status: DC | PRN

## 2023-08-05 SURGICAL SUPPLY — 55 items
BAG COUNTER SPONGE SURGICOUNT (BAG) ×1 IMPLANT
BALLN MUSTANG 7.0X40 75 (BALLOONS) ×1
BALLOON MUSTANG 7.0X40 75 (BALLOONS) IMPLANT
BNDG COHESIVE 3X5 TAN ST LF (GAUZE/BANDAGES/DRESSINGS) IMPLANT
CANISTER SUCT 3000ML PPV (MISCELLANEOUS) ×1 IMPLANT
CATH BEACON 5 .035 65 KMP TIP (CATHETERS) IMPLANT
CATH OMNI FLUSH 5F 65CM (CATHETERS) ×1 IMPLANT
CHLORAPREP W/TINT 26 (MISCELLANEOUS) ×1 IMPLANT
CLIP TI MEDIUM 24 (CLIP) ×1 IMPLANT
CLIP TI WIDE RED SMALL 24 (CLIP) ×1 IMPLANT
DERMABOND ADVANCED .7 DNX12 (GAUZE/BANDAGES/DRESSINGS) ×1 IMPLANT
DERMABOND ADVANCED .7 DNX6 (GAUZE/BANDAGES/DRESSINGS) IMPLANT
DRAIN CHANNEL 15F RND FF W/TCR (WOUND CARE) IMPLANT
DRAPE C-ARM 42X72 X-RAY (DRAPES) IMPLANT
ELECT REM PT RETURN 9FT ADLT (ELECTROSURGICAL) ×1
ELECTRODE REM PT RTRN 9FT ADLT (ELECTROSURGICAL) ×1 IMPLANT
EVACUATOR SILICONE 100CC (DRAIN) IMPLANT
GLIDEWIRE ADV .035X180CM (WIRE) IMPLANT
GLIDEWIRE ADV .035X260CM (WIRE) IMPLANT
GLOVE BIO SURGEON STRL SZ7.5 (GLOVE) ×1 IMPLANT
GLOVE BIOGEL PI IND STRL 8 (GLOVE) ×1 IMPLANT
GOWN STRL REUS W/ TWL LRG LVL3 (GOWN DISPOSABLE) ×3 IMPLANT
GOWN STRL REUS W/ TWL XL LVL3 (GOWN DISPOSABLE) ×2 IMPLANT
GRAFT VASC PATCH XENOSURE 1X14 (Vascular Products) IMPLANT
HEMOSTAT SNOW SURGICEL 2X4 (HEMOSTASIS) IMPLANT
HEMOSTAT SPONGE AVITENE ULTRA (HEMOSTASIS) IMPLANT
KIT BASIN OR (CUSTOM PROCEDURE TRAY) ×1 IMPLANT
KIT ENCORE 26 ADVANTAGE (KITS) IMPLANT
KIT TURNOVER KIT B (KITS) ×1 IMPLANT
LOOP VESSEL MINI RED (MISCELLANEOUS) IMPLANT
NS IRRIG 1000ML POUR BTL (IV SOLUTION) ×2 IMPLANT
PACK ENDO MINOR (CUSTOM PROCEDURE TRAY) ×1 IMPLANT
PACK PERIPHERAL VASCULAR (CUSTOM PROCEDURE TRAY) ×1 IMPLANT
PAD ARMBOARD 7.5X6 YLW CONV (MISCELLANEOUS) ×2 IMPLANT
SET MICROPUNCTURE 5F STIFF (MISCELLANEOUS) ×1 IMPLANT
SHEATH BRITE TIP 7FRX11 (SHEATH) IMPLANT
SHEATH PINNACLE 5F 10CM (SHEATH) ×1 IMPLANT
SHEATH PINNACLE 8F 10CM (SHEATH) ×1 IMPLANT
STENT INNOVA 8X40X130 (Permanent Stent) IMPLANT
SUT MNCRL AB 4-0 PS2 18 (SUTURE) ×1 IMPLANT
SUT PROLENE 5 0 C 1 24 (SUTURE) ×1 IMPLANT
SUT PROLENE 6 0 BV (SUTURE) IMPLANT
SUT VIC AB 2-0 CT1 TAPERPNT 27 (SUTURE) ×1 IMPLANT
SUT VIC AB 3-0 SH 27X BRD (SUTURE) ×1 IMPLANT
SYR 10ML LL (SYRINGE) IMPLANT
SYR MEDRAD MARK V 150ML (SYRINGE) IMPLANT
TOWEL GREEN STERILE (TOWEL DISPOSABLE) ×1 IMPLANT
TRAY FOLEY MTR SLVR 16FR STAT (SET/KITS/TRAYS/PACK) IMPLANT
TUBING HIGH PRESSURE 120CM (CONNECTOR) IMPLANT
UNDERPAD 30X36 HEAVY ABSORB (UNDERPADS AND DIAPERS) ×1 IMPLANT
WATER STERILE IRR 1000ML POUR (IV SOLUTION) ×1 IMPLANT
WIRE AMPLATZ SS-J .035X180CM (WIRE) IMPLANT
WIRE AMPLATZ SS-J .035X260CM (WIRE) IMPLANT
WIRE BENTSON .035X145CM (WIRE) ×1 IMPLANT
WIRE G V18X300CM (WIRE) IMPLANT

## 2023-08-05 NOTE — Discharge Instructions (Signed)

## 2023-08-05 NOTE — Anesthesia Procedure Notes (Addendum)
Procedure Name: Intubation Date/Time: 08/05/2023 11:30 AM  Performed by: Camillia Herter, CRNAPre-anesthesia Checklist: Patient identified, Emergency Drugs available, Suction available and Patient being monitored Patient Re-evaluated:Patient Re-evaluated prior to induction Oxygen Delivery Method: Circle System Utilized Preoxygenation: Pre-oxygenation with 100% oxygen Induction Type: IV induction Ventilation: Mask ventilation without difficulty Laryngoscope Size: Miller and 2 Grade View: Grade II Tube type: Oral Tube size: 8.0 mm Number of attempts: 1 Airway Equipment and Method: Stylet and Oral airway Placement Confirmation: ETT inserted through vocal cords under direct vision, positive ETCO2 and breath sounds checked- equal and bilateral Secured at: 23 cm Tube secured with: Tape Dental Injury: Teeth and Oropharynx as per pre-operative assessment

## 2023-08-05 NOTE — Progress Notes (Signed)
Pt admitted to rm 15 from PACU. Initiated tele. Oriented pt to the unit. VSS. Call bell within reach.   Lawson Radar, RN

## 2023-08-05 NOTE — H&P (Signed)
History and Physical Interval Note:  08/05/2023 10:34 AM  [name]  has presented today for surgery, with the diagnosis of rest pain and stenosis.  The various methods of treatment have been discussed with the patient and family. After consideration of risks, benefits and other options for treatment, the patient has consented to  Procedure(s): ENDARTERECTOMY FEMORAL (Right) PATCH ANGIOPLASTY (Right) INSERTION OF ILIAC STENT (Right) as a surgical intervention.  The patient's history has been reviewed, patient examined, no change in status, stable for surgery.  I have reviewed the patient's chart and labs.  Questions were answered to the patient's satisfaction.     Cephus Shelling     Patient name: [name] MRN: [id number]        DOB: 1960-10-06          Sex: male   REASON FOR CONSULT: F/U after CTA, PAD with right foot pain   HPI: [name] is a 62 y.o. male, with history of coronary artery disease status post CABG, COPD, hypertension, hyperlipidemia, diabetes, tobacco abuse that presents for follow-up after CTA for evaluation of peripheral arterial disease.  Patient previously seen with right foot pain, constantly.  Has been hurting for 3-4 months with pain and numbness all the time.  Stated this started after his hip surgery about 4 months ago.  He has been under the care of Dr. Allyson Sabal.  Most recently had a right SFA orbital atherectomy with Dr. Allyson Sabal on 04/01/2023 and his ABIs improved from 0.55 to 0.66.  He has had prior right SFA intervention in the past prior to his most recent SFA intervention.   In review of the notes he previously had a left SFA CTO treated by Hoy Finlay.  On 03/30/2021 he had a orbital atherectomy and VBX stenting of his left common iliac artery for stenosis by Dr. Allyson Sabal.   Recent duplex suggests some right common femoral and proximal SFA disease as well as right external iliac disease.   I then sent him for CTA.       Past Medical History:   Diagnosis Date   Arthritis      "some; in hips sometimes" (01/02/2013)   Back pain      "w/prolonged walking or standing" (01/02/2013)   CAD (coronary artery disease)      CABG X 4 08/2012   COPD (chronic obstructive pulmonary disease) (HCC)     Elevated PSA     Esophageal erosions 05/24/2001    egd by Dr. Jena Gauss   GERD (gastroesophageal reflux disease)     H/O hiatal hernia     Headache     History of gout     Hyperlipidemia     Hypertension      not on any medication for htn at present   PAD (peripheral artery disease) (HCC)      LEA DOPPLER, 11/11/2012 - moderate arterial insufficiency to lower extremities at rest, BILATERAL SFA-demonstrates occlusive disease with reconstitution of flow   S/P percutaneous transluminal angioplasty (PTA) with stent placement 03/29/21 03/31/2021   Tobacco abuse     Tubular adenoma of colon 08/28/2011   Type II diabetes mellitus (HCC)     Unintentional weight loss                 Past Surgical History:  Procedure Laterality Date   ABDOMINAL AORTOGRAM W/LOWER EXTREMITY N/A 03/30/2021    Procedure: ABDOMINAL AORTOGRAM W/LOWER EXTREMITY;  Surgeon: Runell Gess, MD;  Location: [facility] INVASIVE CV  LAB;  Service: Cardiovascular;  Laterality: N/A;   ABDOMINAL AORTOGRAM W/LOWER EXTREMITY N/A 04/12/2022    Procedure: ABDOMINAL AORTOGRAM W/LOWER EXTREMITY;  Surgeon: Runell Gess, MD;  Location: [facility] INVASIVE CV LAB;  Service: Cardiovascular;  Laterality: N/A;   ABDOMINAL AORTOGRAM W/LOWER EXTREMITY N/A 04/01/2023    Procedure: ABDOMINAL AORTOGRAM W/LOWER EXTREMITY;  Surgeon: Runell Gess, MD;  Location: [facility] INVASIVE CV LAB;  Service: Cardiovascular;  Laterality: N/A;   ANTERIOR CERVICAL DECOMP/DISCECTOMY FUSION   12/19/2011    Procedure: ANTERIOR CERVICAL DECOMPRESSION/DISCECTOMY FUSION 2 LEVELS;  Surgeon: Karn Cassis, MD;  Location: [facility] NEURO ORS;  Service: Neurosurgery;  Laterality: N/A;  Cervical four-five Cervical five-six  Anterior cervical  decompression/diskectomy, fusion, plate   ATHERECTOMY N/A 01/02/2013    Procedure: ATHERECTOMY;  Surgeon: Runell Gess, MD;  Location: [facility] CATH LAB;  Service: Cardiovascular;  Laterality: N/A;   BACK SURGERY        x3   CARDIAC CATHETERIZATION   08/19/2012    CABG warranted; Severe ostial LCx stenosis with mid to distal vessel occlusion   COLONOSCOPY W/ POLYPECTOMY   08/28/2011    Rourk-tubular adenomas removed from ascending colon, suboptimal prep, diminutive rectal polyps   COLONOSCOPY WITH PROPOFOL N/A 06/13/2017    Procedure: COLONOSCOPY WITH PROPOFOL;  Surgeon: Corbin Ade, MD;  Location: [facility] ENDO SUITE;  Service: Endoscopy;  Laterality: N/A;  9:15am   CORONARY ARTERY BYPASS GRAFT   08/22/2012    Procedure: CORONARY ARTERY BYPASS GRAFTING (CABG);  Surgeon: Delight Ovens, MD;  Location: [facility] OR;  Service: Open Heart Surgery;  Laterality: N/A;  CABG x four,  using left internal mammary artery and right leg greater saphenous vein harvested endoscopically   ESOPHAGOGASTRODUODENOSCOPY   05/24/2001    by Dr. Jena Gauss   ESOPHAGOGASTRODUODENOSCOPY   08/28/2011    Rourk-erosive reflux esophagitis, gastric erosions   ESOPHAGOGASTRODUODENOSCOPY (EGD) WITH PROPOFOL N/A 06/13/2017    Procedure: ESOPHAGOGASTRODUODENOSCOPY (EGD) WITH PROPOFOL;  Surgeon: Corbin Ade, MD;  Location: [facility] ENDO SUITE;  Service: Endoscopy;  Laterality: N/A;   INTRAOPERATIVE TRANSESOPHAGEAL ECHOCARDIOGRAM   08/22/2012    Procedure: INTRAOPERATIVE TRANSESOPHAGEAL ECHOCARDIOGRAM;  Surgeon: Delight Ovens, MD;  Location: [facility] OR;  Service: Open Heart Surgery;  Laterality: N/A;   IR 3D INDEPENDENT WKST   05/17/2022   IR ANGIO INTRA EXTRACRAN SEL INTERNAL CAROTID UNI R MOD SED   05/17/2022   IR ANGIO INTRA EXTRACRAN SEL INTERNAL CAROTID UNI R MOD SED   12/04/2022   IR ANGIOGRAM FOLLOW UP STUDY   05/17/2022   IR NEURO EACH ADD'L AFTER BASIC UNI RIGHT (MS)   05/17/2022   IR TRANSCATH/EMBOLIZ   05/17/2022   IR US GUIDE  VASC ACCESS RIGHT   05/17/2022   IR US GUIDE VASC ACCESS RIGHT   12/04/2022   LEFT HEART CATHETERIZATION WITH CORONARY ANGIOGRAM N/A 08/19/2012    Procedure: LEFT HEART CATHETERIZATION WITH CORONARY ANGIOGRAM;  Surgeon: Chrystie Nose, MD;  Location: [facility] CATH LAB;  Service: Cardiovascular;  Laterality: N/A;   LOWER EXTREMITY ANGIOGRAM N/A 11/17/2012    Procedure: LOWER EXTREMITY ANGIOGRAM;  Surgeon: Runell Gess, MD;  Location: [facility] CATH LAB;  Service: Cardiovascular;  Laterality: N/A;   LOWER EXTREMITY ANGIOGRAM N/A 02/02/2013    Procedure: LOWER EXTREMITY ANGIOGRAM;  Surgeon: Runell Gess, MD;  Location: [facility] CATH LAB;  Service: Cardiovascular;  Laterality: N/A;   LUMBAR DISC SURGERY       LUMBAR FUSION   ~ 2011   PELVIC ABCESS  DRAINAGE        x2   PERIPHERAL VASCULAR ATHERECTOMY   03/30/2021    Procedure: PERIPHERAL VASCULAR ATHERECTOMY;  Surgeon: Runell Gess, MD;  Location: [facility] INVASIVE CV LAB;  Service: Cardiovascular;;  left common iliac artery   PERIPHERAL VASCULAR ATHERECTOMY   04/01/2023    Procedure: PERIPHERAL VASCULAR ATHERECTOMY;  Surgeon: Runell Gess, MD;  Location: [facility] INVASIVE CV LAB;  Service: Cardiovascular;;   PERIPHERAL VASCULAR BALLOON ANGIOPLASTY Left 04/12/2022    Procedure: PERIPHERAL VASCULAR BALLOON ANGIOPLASTY;  Surgeon: Runell Gess, MD;  Location: [facility] INVASIVE CV LAB;  Service: Cardiovascular;  Laterality: Left;  SFA   PERIPHERAL VASCULAR BALLOON ANGIOPLASTY Right 04/01/2023    Procedure: PERIPHERAL VASCULAR BALLOON ANGIOPLASTY;  Surgeon: Runell Gess, MD;  Location: [facility] INVASIVE CV LAB;  Service: Cardiovascular;  Laterality: Right;  DCB - SFA   PERIPHERAL VASCULAR INTERVENTION   03/30/2021    Procedure: PERIPHERAL VASCULAR INTERVENTION;  Surgeon: Runell Gess, MD;  Location: [facility] INVASIVE CV LAB;  Service: Cardiovascular;;   POLYPECTOMY   06/13/2017    Procedure: POLYPECTOMY;  Surgeon: Corbin Ade, MD;  Location: [facility] ENDO SUITE;   Service: Endoscopy;;  colon   RADIOLOGY WITH ANESTHESIA N/A 05/17/2022    Procedure: Diagnostic Cerebral Angiogram, Possible Aneurysm Coiling, Possible Stent;  Surgeon: Lisbeth Renshaw, MD;  Location: [facility] OR;  Service: Radiology;  Laterality: N/A;   SHOULDER SURGERY Right     TOTAL HIP ARTHROPLASTY Right 12/25/2022    Procedure: TOTAL HIP ARTHROPLASTY;  Surgeon: Teryl Lucy, MD;  Location: [facility] ORS;  Service: Orthopedics;  Laterality: Right;   TOTAL HIP ARTHROPLASTY Right     VASCULAR SURGERY Right 01/02/2013    diamondback orbital rotational  atherectomy, chocolate balloon and IDEV  Stent.               Family History  Problem Relation Age of Onset   Anesthesia problems Neg Hx     Colon cancer Neg Hx     Gastric cancer Neg Hx     Esophageal cancer Neg Hx     Colon polyps Neg Hx            SOCIAL HISTORY: Social History         Socioeconomic History   Marital status: Single      Spouse name: Not on file   Number of children: 1   Years of education: Not on file   Highest education level: Not on file  Occupational History   Occupation: disabled      Employer: DISABLED  Tobacco Use   Smoking status: Every Day      Current packs/day: 1.50      Average packs/day: 1.5 packs/day for 39.0 years (58.5 ttl pk-yrs)      Types: Cigarettes   Smokeless tobacco: Never  Vaping Use   Vaping status: Never Used  Substance and Sexual Activity   Alcohol use: Yes      Alcohol/week: 12.0 standard drinks of alcohol      Types: 12 Cans of beer per week      Comment: weekly   Drug use: No   Sexual activity: Not Currently  Other Topics Concern   Not on file  Social History Narrative    Lives w/ girlfriend    Social Determinants of Health        Financial Resource Strain: Low Risk  (03/20/2023)    Received from [facility]    Overall Financial Resource Strain (CARDIA)  Difficulty of Paying Living Expenses: Not very hard  Food Insecurity: Low Risk  (05/23/2023)    Received  from [facility]    Hunger Vital Sign     Worried About Running Out of Food in the Last Year: Never true     Ran Out of Food in the Last Year: Never true  Transportation Needs: No Transportation Needs (05/23/2023)    Received from [employer]     In the past 12 months, has lack of reliable transportation kept you from medical appointments, meetings, work or from getting things needed for daily living? : No  Physical Activity: Unknown (03/20/2023)    Received from [facility]    Exercise Vital Sign     Days of Exercise per Week: 0 days     Minutes of Exercise per Session: Not on file  Stress: No Stress Concern Present (03/20/2023)    Received from [facility] - Occupational Stress Questionnaire     Feeling of Stress : Not at all  Social Connections: Moderately Integrated (03/20/2023)    Received from [facility]    Social Network     How would you rate your social network (family, work, friends)?: Adequate participation with social networks  Intimate Partner Violence: Not At Risk (04/01/2023)    Humiliation, Afraid, Rape, and Kick questionnaire     Fear of Current or Ex-Partner: No     Emotionally Abused: No     Physically Abused: No     Sexually Abused: No      Allergies  No Known Allergies           Current Outpatient Medications  Medication Sig Dispense Refill   acetaminophen (TYLENOL) 650 MG CR tablet Take 1,300 mg by mouth daily as needed for pain.       albuterol (VENTOLIN HFA) 108 (90 Base) MCG/ACT inhaler Inhale 2 puffs into the lungs every 6 (six) hours as needed for wheezing or shortness of breath.       aspirin EC 81 MG EC tablet Take 1 tablet (81 mg total) by mouth daily.       Cholecalciferol (VITAMIN D-3 PO) Take 1 capsule by mouth daily.       clopidogrel (PLAVIX) 75 MG tablet Take 1 tablet (75 mg total) by mouth daily with breakfast. 30 tablet 11   cyclobenzaprine (FLEXERIL) 10 MG tablet Take 1  tablet (10 mg total) by mouth every 8 (eight) hours as needed for muscle spasms.       empagliflozin (JARDIANCE) 25 MG TABS tablet Take 25 mg by mouth daily.       folic acid (FOLVITE) 1 MG tablet Take 1 tablet (1 mg total) by mouth daily. 30 tablet 1   gabapentin (NEURONTIN) 600 MG tablet Take 600 mg by mouth 3 (three) times daily. (Patient not taking: Reported on 06/11/2023)       gabapentin (NEURONTIN) 800 MG tablet Take 800 mg by mouth 3 (three) times daily.       glipiZIDE (GLUCOTROL XL) 10 MG 24 hr tablet Take 10 mg by mouth 2 (two) times daily.       icosapent Ethyl (VASCEPA) 1 g capsule Take 2 g by mouth 2 (two) times daily.       isosorbide mononitrate (IMDUR) 60 MG 24 hr tablet Take 1 tablet (60 mg total) by mouth daily. 30 tablet 2   losartan (COZAAR) 50 MG tablet Take 50 mg  by mouth daily.       metoprolol succinate (TOPROL-XL) 25 MG 24 hr tablet TAKE 1 TABLET BY MOUTH ONCE A DAY. 90 tablet 3   ondansetron (ZOFRAN) 4 MG tablet Take 1 tablet (4 mg total) by mouth every 8 (eight) hours as needed for nausea or vomiting. 10 tablet 0   oxyCODONE-acetaminophen (PERCOCET) 10-325 MG tablet Take 1 tablet by mouth 4 (four) times daily as needed for pain.       rosuvastatin (CRESTOR) 40 MG tablet Take 1 tablet (40 mg total) by mouth daily. 30 tablet 1   thiamine (VITAMIN B-1) 100 MG tablet Take 1 tablet (100 mg total) by mouth daily. 30 tablet 2   zolpidem (AMBIEN CR) 12.5 MG CR tablet Take 12.5 mg by mouth at bedtime.          No current facility-administered medications for this visit.        REVIEW OF SYSTEMS:  [X]  denotes positive finding, [ ]  denotes negative finding Cardiac   Comments:  Chest pain or chest pressure:      Shortness of breath upon exertion:      Short of breath when lying flat:      Irregular heart rhythm:             Vascular      Pain in calf, thigh, or hip brought on by ambulation:      Pain in feet at night that wakes you up from your sleep:  x right  Blood  clot in your veins:      Leg swelling:              Pulmonary      Oxygen at home:      Productive cough:       Wheezing:              Neurologic      Sudden weakness in arms or legs:       Sudden numbness in arms or legs:       Sudden onset of difficulty speaking or slurred speech:      Temporary loss of vision in one eye:       Problems with dizziness:              Gastrointestinal      Blood in stool:       Vomited blood:              Genitourinary      Burning when urinating:       Blood in urine:             Psychiatric      Major depression:              Hematologic      Bleeding problems:      Problems with blood clotting too easily:             Skin      Rashes or ulcers:             Constitutional      Fever or chills:          PHYSICAL EXAM: There were no vitals filed for this visit.   GENERAL: The patient is a well-nourished male, in no acute distress. The vital signs are documented above. CARDIAC: There is a regular rate and rhythm.  VASCULAR:  Weakly palpable femoral pulses Left DP palpable No palpable right pedal pulses No tissue loss PULMONARY:  No respiratory distress.   ABDOMEN: Soft and non-tender. MUSCULOSKELETAL: There are no major deformities or cyanosis. NEUROLOGIC: No focal weakness or paresthesias are detected. SKIN: There are no ulcers or rashes noted. PSYCHIATRIC: The patient has a normal affect.   DATA:    CTA reviewed from 07/03/2023 with greater than 70% stenosis of the right common and external iliac artery and also >50% stenosis of the right common femoral artery.  His right SFA intervention is patent after previous stenting.   ABIs 04/15/23 are 0.66 on the right monophasic and 0.89 left biphasic    Right leg arterial duplex 04/15/23 suggest moderate common femoral stenosis 50-74% as well as proximal SFA disease    Assessment/Plan:   62 y.o. male, with history of coronary artery disease status post CABG, COPD, hypertension,  hyperlipidemia, diabetes, tobacco abuse that presents for follow-up after CTA and further evaluation of his PAD.  Patient complains of 3-4 months of pain and numbness in the right foot.  He has previously been under the care of Dr. Allyson Sabal and most recently underwent right SFA orbital atherectomy on 04/01/2023 and his ABIs only mildly improved from 0.55 to 0.66.  I previously  discussed that with an ABI 0.66 I would not expect him to have ischemic rest pain keeping him awake at night with constant numbness.  I did send him for a CTA.   Discussed that his CTA confirmed significant right common femoral disease with iliac disease - he also has tibial disease (mult-level as we discussed today).  I again discussed with an ABI of 0.6 I would not expect him to have ischemic rest pain.  He tells me that he cannot live with the symptoms and would be willing to pursue limb amputation given the severity of his discomfort in the foot.  I reviewed his CTA showing multilevel occlusive disease.  Discussed his SFA intervention from Dr. Allyson Sabal remains patent.  From my standpoint discussed I would offer a right common femoral endarterectomy with retrograde iliac stenting.  Risks and benefits discussed including risk of wound healing problems, infection, bleeding, vessel injury.  He wishes to get surgery scheduled.  I discussed the other option of continued observation which he does not feel he can tolerate.  Again discussed that if his pain does not improve with further revascularization this is likely related to neuropathy or some other etiology.  I will also reach out to Dr. Allyson Sabal to make sure he is in agreement.   Cephus Shelling, MD [facility] Office: [phone]

## 2023-08-05 NOTE — Anesthesia Procedure Notes (Addendum)
Arterial Line Insertion Start/End12/30/2024 11:05 AM, 08/05/2023 11:09 AM Performed by: Camillia Herter, CRNA, CRNA  Patient location: Pre-op. Preanesthetic checklist: patient identified, IV checked, site marked, risks and benefits discussed, surgical consent, monitors and equipment checked, pre-op evaluation, timeout performed and anesthesia consent Lidocaine 1% used for infiltration Left, radial was placed Catheter size: 20 G Hand hygiene performed  and maximum sterile barriers used   Attempts: 1 Procedure performed using ultrasound guided technique. Ultrasound Notes:anatomy identified Following insertion, dressing applied and Biopatch. Post procedure assessment: normal and unchanged  Patient tolerated the procedure well with no immediate complications.

## 2023-08-05 NOTE — Transfer of Care (Signed)
Immediate Anesthesia Transfer of Care Note  Patient: [name]  Procedure(s) Performed: ENDARTERECTOMY FEMORAL with Profundoplasty (Right: Groin) PATCH ANGIOPLASTY (Right: Groin) Insertion of Iliac Stent with Iliac Arteriogram (Right: Groin)  Patient Location: PACU  Anesthesia Type:General  Level of Consciousness: awake, alert , and oriented  Airway & Oxygen Therapy: Patient Spontanous Breathing and Patient connected to face mask oxygen  Post-op Assessment: Report given to RN and Post -op Vital signs reviewed and stable  Post vital signs: Reviewed and stable  Last Vitals:  Vitals Value Taken Time  BP 133/61 08/05/23 1445  Temp 36.8 C 08/05/23 1436  Pulse 83 08/05/23 1447  Resp 14 08/05/23 1447  SpO2 92 % 08/05/23 1447  Vitals shown include unfiled device data.  Last Pain:  Vitals:   08/05/23 0934  TempSrc:   PainSc: 8          Complications: No notable events documented.

## 2023-08-05 NOTE — Op Note (Signed)
Date: August 05, 2023  Preoperative diagnosis: Critical limb ischemia of right lower extremity with rest pain  Postoperative diagnosis: Same  Procedure: 1.  Right common femoral endarterectomy including profundoplasty and endarterectomy of the proximal SFA with bovine pericardial patch angioplasty 2.  Right iliac arteriogram 3.  Angioplasty and stent of right external iliac artery (stent with a 8 mm x 40 mm Innova postdilated with a 7 mm Mustang)  Surgeon: Cephus Shelling, MD  Assistant: Doreatha Massed, PA  Indications: 62 year old male with history of multilevel PAD and bilateral lower extremity interventions.  Recently underwent right SFA intervention with Dr. Allyson Sabal and has had ongoing right lower extremity complaints.  Ultimately CTA did show significant iliac as well as common femoral disease on the right.  He presents today for multilevel revascularization including right femoral endarterectomy with retrograde iliac stenting after risks benefits discussed.  An assistant was needed given the complexity of the case and also for endarterectomy and sewing the patch angioplasty.  Findings: Extensive endarterectomy of the right common femoral artery carried onto the proximal SFA for a greater than 80% common femoral stenosis and we also performed eversion endarterectomy of the profunda.  I tacked down the plaque in the proximal SFA and we had excellent backbleeding from the profunda.  A bovine pericardial patch was sewn to the right common femoral artery onto the proximal SFA.  I stuck the patch retrograde and got an iliac arteriogram and then stented the right external iliac artery with an 8 mm x 40 mm Innova for greater than 80% flow-limiting stenosis.  The right common iliac artery had a <50% stenosis that did not appear flow-limiting.  We elected not to treat this.  We did preserve hypogastric on the right.  Palpable right femoral pulse with brisk PT signal completion.  Anesthesia:  General  Details: Patient was taken to the operating room after informed consent was obtained.  Placed on operative table in the supine position.  General endotracheal anesthesia was induced.  The right groin was then prepped and draped standard sterile fashion.  Antibiotics were given and timeout performed.  Initially made a horizontal incision in the right groin above the inguinal crease.  Dissected through the subcutaneous tissue with Bovie cautery and used cerebellar retractors for added visualization.  The femoral sheath was opened longitudinally.  I dissected out the common femoral as well as the SFA and profunda and the distal external iliac artery and all these branches were controlled with Vesseloops.  Patient was then given 100 units/kg IV heparin.  We checked ACT to maintain greater than 250.  I used Vesseloops on the SFA and profunda and then a Henley clamp on the distal external iliac artery.  The right common femoral was opened with 11 blade scalpel and Potts scissors.  There was extensive flow-limiting plaque over 80% in the right common femoral artery down to the bifurcation of the SFA and profunda.  All of this was endarterectomized with [facility] including the distal external iliac.  I had excellent pulsatile inflow.  Distally I did transect the plaque in the proximal SFA and tacked this plaque  down with multiple 6-0 Prolene tacking sutures.  The profunda was endarterectomized with eversion technique and we had excellent pulsatile backbleeding.  A large bovine pericardial patch was brought on the field and sewn in place with 5-0 Prolene parachute technique and de-aired prior to completion with the help of my assistant.  I then had to stick the patch retrograde with a  micro access needle and micro wire and placed a micro sheath.  I then crossed the iliac stenosis retrograde with a Glidewire advantage under fluoroscopy.  I then upsized to a 7 [place] sheath.  I obtained a retrograde iliac  arteriogram showing a flow-limiting greater than 80% stenosis in the right external iliac artery.  This was then stented with an 8 mm x 40 mm Innova postdilated with a 7 mm Mustang.  This was widely patent with preservation of the hypogastric.  I did look at the common iliac artery in multiple views and this was there was calcified plaque but this was less than 50% that did not appear flow-limiting.  We had excellent pulse in the bovine patch that was much improved as he had no femoral pulse prior to intervention.  We elected not to treat the right common iliac artery disease.  Wires and catheters were removed and I did repair the arteriotomy in the patch for placement of the sheath with a 5-0 Prolene.  We listened to the foot and had excellent Doppler flow.  Protamine was given for reversal.  The groin was irrigated out and closed in multiple layers of 2-0 Vicryl, 3-0 Vicryl, 4-0 Monocryl and Dermabond.  Complication: None  Condition: Stable  Cephus Shelling, MD [facility] [place] Office: [phone]   Cephus Shelling

## 2023-08-06 ENCOUNTER — Encounter (HOSPITAL_COMMUNITY): Payer: Self-pay | Admitting: Vascular Surgery

## 2023-08-06 LAB — BASIC METABOLIC PANEL
Anion gap: 8 (ref 5–15)
BUN: 23 mg/dL (ref 8–23)
CO2: 22 mmol/L (ref 22–32)
Calcium: 8.2 mg/dL — ABNORMAL LOW (ref 8.9–10.3)
Chloride: 100 mmol/L (ref 98–111)
Creatinine, Ser: 1.2 mg/dL (ref 0.61–1.24)
GFR, Estimated: >60 mL/min (ref >60)
Glucose, Bld: 437 mg/dL — ABNORMAL HIGH (ref 70–99)
Potassium: 4.9 mmol/L (ref 3.5–5.1)
Sodium: 130 mmol/L — ABNORMAL LOW (ref 135–145)

## 2023-08-06 LAB — LIPID PANEL
Cholesterol: 106 mg/dL (ref 0–200)
HDL: 41 mg/dL (ref >40)
LDL Cholesterol: 9 mg/dL (ref 0–99)
Total CHOL/HDL Ratio: 2.6 {ratio}
Triglycerides: 282 mg/dL — ABNORMAL HIGH (ref <150)
VLDL: 56 mg/dL — ABNORMAL HIGH (ref 0–40)

## 2023-08-06 LAB — CBC
HCT: 40.4 % (ref 39.0–52.0)
Hemoglobin: 13.4 g/dL (ref 13.0–17.0)
MCH: 31.6 pg (ref 26.0–34.0)
MCHC: 33.2 g/dL (ref 30.0–36.0)
MCV: 95.3 fL (ref 80.0–100.0)
Platelets: 148 10*3/uL — ABNORMAL LOW (ref 150–400)
RBC: 4.24 MIL/uL (ref 4.22–5.81)
RDW: 12.9 % (ref 11.5–15.5)
WBC: 11.7 10*3/uL — ABNORMAL HIGH (ref 4.0–10.5)
nRBC: 0 % (ref 0.0–0.2)

## 2023-08-06 LAB — POCT ACTIVATED CLOTTING TIME
Activated Clotting Time: 256 s
Activated Clotting Time: 285 s

## 2023-08-06 LAB — GLUCOSE, CAPILLARY: Glucose-Capillary: 371 mg/dL — ABNORMAL HIGH (ref 70–99)

## 2023-08-06 NOTE — Progress Notes (Addendum)
 Patient given discharge instructions, medication list and and follow up appointments. IV and tele were dc'd, all questions answered at this time. Pt states he recently had a hip replaced and has equipment for home use already at home (cane and walker). Pt has voided. Ambulated patient in hallway approximately 270 feet, pt ambulated independently gait steady. States he thinks his foot feels better already Will discharge home as ordered. [place] Stell Jessup RN

## 2023-08-06 NOTE — Plan of Care (Signed)
   Problem: Activity: Goal: Ability to tolerate increased activity will improve Outcome: Progressing

## 2023-08-06 NOTE — Discharge Summary (Signed)
 Discharge Summary  Patient ID: [name] [id number] 62 y.o. 05/21/1961  Admit date: 08/05/2023  Discharge date and time: 08/06/2023  9:49 AM   Admitting Physician: Lonni JINNY Gaskins, MD   Discharge Physician: same  Admission Diagnoses: Rest pain of lower extremity due to atherosclerosis (HCC) [I70.229] PAD (peripheral artery disease) (HCC) [I73.9]  Discharge Diagnoses: same  Admission Condition: fair  Discharged Condition: fair  Indication for Admission: post operative care  Hospital Course: Mr. Bora Broner is a 62 year old male who was brought in as an outpatient and underwent right common femoral endarterectomy with bovine patch angioplasty and angioplasty and stenting of the right external iliac artery by Dr. Gaskins on 08/05/2023 due to critical limb ischemia with rest pain.  He tolerated the procedure well and was admitted to the hospital postoperatively.  POD #1 he was ready for discharge home.  His groin incision had local ecchymosis however was soft and appeared to be healing well.  On exam he had brisk PT and DP signals by Doppler.  He will continue aspirin , Plavix , statin daily.  After reviewing his history of narcotic use, he was not prescribed pain medicine at discharge.  He will follow-up in office in about 2 to 3 weeks for incision check.  Discharge instructions were reviewed with the patient and he voiced his understanding.  He was discharged home in stable condition.  Consults: None  Treatments: surgery: Right common femoral endarterectomy including profundoplasty with bovine patch angioplasty, angioplasty and stenting of the right external iliac artery by Dr. Gaskins on 08/05/2023  Discharge Exam: See progress note 08/06/23 Vitals:   08/06/23 0300 08/06/23 0801  BP: (!) 113/58 (!) 154/69  Pulse: 74 90  Resp: 14   Temp: 98.1 F (36.7 C) 97.7 F (36.5 C)  SpO2: 91% 91%     Disposition: Discharge disposition: 01-Home or Self Care       Patient  Instructions:  Allergies as of 08/06/2023   No Known Allergies      Medication List     TAKE these medications    acetaminophen  650 MG CR tablet Commonly known as: TYLENOL  Take 1,300 mg by mouth daily as needed for pain.   albuterol  108 (90 Base) MCG/ACT inhaler Commonly known as: VENTOLIN  HFA Inhale 2 puffs into the lungs every 6 (six) hours as needed for wheezing or shortness of breath.   aspirin  EC 81 MG tablet Take 1 tablet (81 mg total) by mouth daily.   cilostazol 100 MG tablet Commonly known as: PLETAL Take by mouth.   clopidogrel  75 MG tablet Commonly known as: PLAVIX  Take 1 tablet (75 mg total) by mouth daily with breakfast.   cyclobenzaprine  10 MG tablet Commonly known as: FLEXERIL  Take 1 tablet (10 mg total) by mouth every 8 (eight) hours as needed for muscle spasms.   empagliflozin  25 MG Tabs tablet Commonly known as: JARDIANCE  Take 25 mg by mouth daily.   folic acid  1 MG tablet Commonly known as: FOLVITE  Take 1 tablet (1 mg total) by mouth daily.   gabapentin  600 MG tablet Commonly known as: NEURONTIN  Take 600 mg by mouth 3 (three) times daily.   gabapentin  800 MG tablet Commonly known as: NEURONTIN  Take 800 mg by mouth 3 (three) times daily.   glipiZIDE  10 MG 24 hr tablet Commonly known as: GLUCOTROL  XL Take 10 mg by mouth 2 (two) times daily.   icosapent  Ethyl 1 g capsule Commonly known as: VASCEPA  Take 2 g by mouth 2 (two) times daily.  isosorbide  mononitrate 60 MG 24 hr tablet Commonly known as: IMDUR  Take 1 tablet (60 mg total) by mouth daily.   losartan  50 MG tablet Commonly known as: COZAAR  Take 50 mg by mouth daily.   metoprolol  succinate 25 MG 24 hr tablet Commonly known as: TOPROL -XL TAKE 1 TABLET BY MOUTH ONCE A DAY.   ondansetron  4 MG tablet Commonly known as: Zofran  Take 1 tablet (4 mg total) by mouth every 8 (eight) hours as needed for nausea or vomiting.   oxyCODONE -acetaminophen  10-325 MG tablet Commonly known  as: PERCOCET Take 1 tablet by mouth 4 (four) times daily as needed for pain.   rosuvastatin  40 MG tablet Commonly known as: CRESTOR  Take 1 tablet (40 mg total) by mouth daily.   thiamine  100 MG tablet Commonly known as: Vitamin B-1 Take 1 tablet (100 mg total) by mouth daily.   VITAMIN D-3 PO Take 1 capsule by mouth daily.   zolpidem  12.5 MG CR tablet Commonly known as: AMBIEN  CR Take 12.5 mg by mouth at bedtime.       Activity: activity as tolerated Diet: regular diet Wound Care: keep wound clean and dry  Follow-up with [facility] in 2 weeks.  SignedBETHA Donnice Sender, PA-C 08/06/2023 10:02 AM [facility] Office: [phone]

## 2023-08-06 NOTE — Progress Notes (Addendum)
  Progress Note    08/06/2023 7:35 AM 1 Day Post-Op  Subjective:  wants to go home.  R foot feels better compared to before surgery   Vitals:   08/05/23 2311 08/06/23 0300  BP: 132/64 (!) 113/58  Pulse: 80 74  Resp: 17 14  Temp: 98.3 F (36.8 C) 98.1 F (36.7 C)  SpO2: 91% 91%   Physical Exam: Lungs:  non labored Incisions:  R groin with ecchymosis but soft; incision is c/d/i Extremities:  PT > DP by doppler Neurologic: A&O  CBC    Component Value Date/Time   WBC 11.7 (H) 08/06/2023 0430   RBC 4.24 08/06/2023 0430   HGB 13.4 08/06/2023 0430   HGB 15.1 03/06/2023 1224   HCT 40.4 08/06/2023 0430   HCT 46.3 03/06/2023 1224   PLT 148 (L) 08/06/2023 0430   PLT 177 03/06/2023 1224   MCV 95.3 08/06/2023 0430   MCV 89 03/06/2023 1224   MCH 31.6 08/06/2023 0430   MCHC 33.2 08/06/2023 0430   RDW 12.9 08/06/2023 0430   RDW 13.6 03/06/2023 1224   LYMPHSABS 1.9 02/09/2023 2341   MONOABS 0.9 02/09/2023 2341   EOSABS 0.2 02/09/2023 2341   BASOSABS 0.1 02/09/2023 2341    BMET    Component Value Date/Time   NA 130 (L) 08/06/2023 0430   NA 141 03/06/2023 1224   K 4.9 08/06/2023 0430   CL 100 08/06/2023 0430   CO2 22 08/06/2023 0430   GLUCOSE 437 (H) 08/06/2023 0430   BUN 23 08/06/2023 0430   BUN 15 06/28/2023 1524   CREATININE 1.20 08/06/2023 0430   CREATININE 0.80 09/09/2013 0953   CALCIUM  8.2 (L) 08/06/2023 0430   GFRNONAA >60 08/06/2023 0430   GFRNONAA >89 09/09/2013 0953   GFRAA >60 09/10/2018 0606   GFRAA >89 09/09/2013 0953    INR    Component Value Date/Time   INR 1.0 07/26/2023 1401     Intake/Output Summary (Last 24 hours) at 08/06/2023 0735 Last data filed at 08/06/2023 0700 Gross per 24 hour  Intake 1744.81 ml  Output 2500 ml  Net -755.19 ml     Assessment/Plan:  62 y.o. male is s/p R iliofemoral endarterectomy with bovine patch angioplasty and R EIA stent 1 Day Post-Op   R foot well perfused based on doppler exam Ecchymosis to R groin  incision but soft  OOB this morning Continue aspirin , plavix , statin daily Ok for  d/c home if walking and urinating   Donnice Sender, PA-C [facility] [phone] 08/06/2023 7:35 AM  I have seen and evaluated the patient. I agree with the PA note as documented above.  Postop day 1 status post extensive right femoral endarterectomy with profundoplasty and endarterectomy of the proximal SFA with bovine patch and right external iliac stent.  He has a brisk triphasic PT signal at the right ankle.  The groin looks good although he has some bruising and ecchymosis.  Patient states he is ready to go home.  States REST pain in his right foot is resolved which is excellent.  Discussed aspirin  statin Plavix .  Will arrange follow-up in 2 to 3 weeks for wound check.  Lonni DOROTHA Gaskins, MD [facility] [place] Office: [phone]

## 2023-08-06 NOTE — TOC Transition Note (Signed)
 Transition of Care (TOC) - Discharge Note Rayfield Gobble RN, BSN Transitions of Care Unit 4E- RN Case Manager See Treatment Team for direct phone #   Patient Details  Name: [name] MRN: [id number] Date of Birth: [birth year]  Transition of Care [facility]) CM/SW Contact:  Gobble Rayfield Hurst, RN Phone Number: 08/06/2023, 11:22 AM   Clinical Narrative:    Pt stable for transition home, per pt he has needed DME at home. CM was notified by [facility] liaison that [facility] office made referral for [facility] needs- [facility] will follow up with pt for [facility] needs under [facility] office protocol.   Pt has transportation home, no further TOC needs noted.    Final next level of care: Home w Home Health Services Barriers to Discharge: No Barriers Identified   Patient Goals and CMS Choice Patient states their goals for this hospitalization and ongoing recovery are:: return home   Choice offered to / list presented to :  ([facility] office referral)      Discharge Placement               Home w/ [facility]        Discharge Plan and Services Additional resources added to the After Visit Summary for     Discharge Planning Services: CM Consult            DME Arranged: N/A DME Agency: NA         HH Agency: [facility] ([facility] Date HH Agency Contacted: 08/06/23 Time HH Agency Contacted: 1121 Representative spoke with at [facility] Agency: [place]  Social Drivers of Health (SDOH) Interventions SDOH Screenings   Food Insecurity: No Food Insecurity (08/05/2023)  Housing: Unknown (08/05/2023)  Transportation Needs: No Transportation Needs (08/05/2023)  Utilities: Not At Risk (08/05/2023)  Financial Resource Strain: Low Risk  (03/20/2023)   Received from [facility]  Physical Activity: Unknown (03/20/2023)   Received from [facility]  Social Connections: Moderately Integrated (03/20/2023)   Received from [facility]  Stress: No Stress Concern Present (03/20/2023)   Received from [facility]   Tobacco Use: High Risk (08/05/2023)     Readmission Risk Interventions    08/06/2023   11:21 AM  Readmission Risk Prevention Plan  Transportation Screening Complete  Home Care Screening Complete  Medication Review (RN CM) Complete

## 2023-09-03 ENCOUNTER — Ambulatory Visit (INDEPENDENT_AMBULATORY_CARE_PROVIDER_SITE_OTHER): Payer: 59 | Admitting: Physician Assistant

## 2023-09-03 VITALS — BP 172/80 | HR 75 | Temp 97.8°F | Resp 18 | Ht 71.0 in | Wt 196.5 lb

## 2023-09-03 DIAGNOSIS — I70221 Atherosclerosis of native arteries of extremities with rest pain, right leg: Secondary | ICD-10-CM

## 2023-09-03 NOTE — Progress Notes (Signed)
POST OPERATIVE OFFICE NOTE    CC:  F/u for surgery  HPI:  This is a 63 y.o. male who is s/p  right common femoral endarterectomy including profundoplasty and endarterectomy of the proximal SFA with bovine pericardial patch angioplasty and angioplasty and stent of right external iliac artery on 08/05/2023 by Dr. Chestine Spore for CLI with rest pain.  He was discharged home on POD 1.  Pt returns today for follow up.  Pt states his right foot pain and neuropathy are unchanged.  He states it is miserable to the point of talking about cutting off his leg.  He also has neuropathy in the left foot but not as bad as the right.  He takes gabapentin 800mg  tid without relief.  He does not get relief with pain medication either.   He states his incision has healed.  He is compliant with his statin/plavix/asa.    No Known Allergies  Current Outpatient Medications  Medication Sig Dispense Refill   acetaminophen (TYLENOL) 650 MG CR tablet Take 1,300 mg by mouth daily as needed for pain.     albuterol (VENTOLIN HFA) 108 (90 Base) MCG/ACT inhaler Inhale 2 puffs into the lungs every 6 (six) hours as needed for wheezing or shortness of breath.     aspirin EC 81 MG EC tablet Take 1 tablet (81 mg total) by mouth daily.     Cholecalciferol (VITAMIN D-3 PO) Take 1 capsule by mouth daily.     cilostazol (PLETAL) 100 MG tablet Take by mouth. (Patient not taking: Reported on 07/26/2023)     clopidogrel (PLAVIX) 75 MG tablet Take 1 tablet (75 mg total) by mouth daily with breakfast. 30 tablet 11   cyclobenzaprine (FLEXERIL) 10 MG tablet Take 1 tablet (10 mg total) by mouth every 8 (eight) hours as needed for muscle spasms.     empagliflozin (JARDIANCE) 25 MG TABS tablet Take 25 mg by mouth daily.     folic acid (FOLVITE) 1 MG tablet Take 1 tablet (1 mg total) by mouth daily. 30 tablet 1   gabapentin (NEURONTIN) 600 MG tablet Take 600 mg by mouth 3 (three) times daily. (Patient not taking: Reported on 07/26/2023)      gabapentin (NEURONTIN) 800 MG tablet Take 800 mg by mouth 3 (three) times daily.     glipiZIDE (GLUCOTROL XL) 10 MG 24 hr tablet Take 10 mg by mouth 2 (two) times daily.     icosapent Ethyl (VASCEPA) 1 g capsule Take 2 g by mouth 2 (two) times daily.     isosorbide mononitrate (IMDUR) 60 MG 24 hr tablet Take 1 tablet (60 mg total) by mouth daily. 30 tablet 2   losartan (COZAAR) 50 MG tablet Take 50 mg by mouth daily.     metoprolol succinate (TOPROL-XL) 25 MG 24 hr tablet TAKE 1 TABLET BY MOUTH ONCE A DAY. 90 tablet 3   ondansetron (ZOFRAN) 4 MG tablet Take 1 tablet (4 mg total) by mouth every 8 (eight) hours as needed for nausea or vomiting. 10 tablet 0   oxyCODONE-acetaminophen (PERCOCET) 10-325 MG tablet Take 1 tablet by mouth 4 (four) times daily as needed for pain.     rosuvastatin (CRESTOR) 40 MG tablet Take 1 tablet (40 mg total) by mouth daily. 30 tablet 1   thiamine (VITAMIN B-1) 100 MG tablet Take 1 tablet (100 mg total) by mouth daily. 30 tablet 2   zolpidem (AMBIEN CR) 12.5 MG CR tablet Take 12.5 mg by mouth at bedtime.  No current facility-administered medications for this visit.     ROS:  See HPI  Physical Exam:  Today's Vitals   09/03/23 1457 09/03/23 1459  BP: (!) 172/80   Pulse: 75   Resp: 18   Temp: 97.8 F (36.6 C)   TempSrc: Temporal   SpO2: 94%   Weight: 196 lb 8 oz (89.1 kg)   Height: 5\' 11"  (1.803 m)   PainSc: 8  8    Body mass index is 27.41 kg/m.   Incision:  healing nicely Extremities:  brisk right PT doppler signal > DP     Assessment/Plan:  This is a 63 y.o. male who is s/p:  right common femoral endarterectomy including profundoplasty and endarterectomy of the proximal SFA with bovine pericardial patch angioplasty and angioplasty and stent of right external iliac artery on 08/05/2023 by Dr. Chestine Spore for CLI with rest pain.  -pt with brisk right PT doppler signal and incision is healing nicely. -he has not had any improvement in the pain and  numbness in the right foot.  Dr. Chestine Spore discussed neurology evaluation to see if there are any other treatment options from their perspective.  Will also refer him back to Dr. Lovell Sheehan to see if he has any back issues that can be contributing to his right foot pain and numbness.  -continue asa/statin/plavix -Dr. Chestine Spore will see pt back in 3 months with aortoiliac duplex, RLE arterial duplex and ABI in [place].   -he will call sooner if any issues before then.   Doreatha Massed, [facility] [phone]   Clinic MD:  Chestine Spore

## 2023-09-17 ENCOUNTER — Encounter: Payer: Self-pay | Admitting: Diagnostic Neuroimaging

## 2023-09-17 ENCOUNTER — Ambulatory Visit (INDEPENDENT_AMBULATORY_CARE_PROVIDER_SITE_OTHER): Payer: 59 | Admitting: Diagnostic Neuroimaging

## 2023-09-17 ENCOUNTER — Telehealth: Payer: Self-pay | Admitting: Diagnostic Neuroimaging

## 2023-09-17 VITALS — BP 121/67 | HR 78 | Ht 71.0 in | Wt 192.0 lb

## 2023-09-17 DIAGNOSIS — M79671 Pain in right foot: Secondary | ICD-10-CM | POA: Diagnosis not present

## 2023-09-17 NOTE — Telephone Encounter (Signed)
[employer] medicare/[place] medicaid NPR sent to [employer] [id number]

## 2023-09-17 NOTE — Progress Notes (Signed)
[facility]  PATIENT: [name] DOB: Nov 09, 1960  REFERRING CLINICIAN: Cephus Shelling, MD HISTORY FROM: patient  REASON FOR VISIT: new consult   HISTORICAL  CHIEF COMPLAINT:  Chief Complaint  Patient presents with   New Patient (Initial Visit)    Pt in room 7. Lenda Kelp in room. Internal referral for neuropathy. Pt said for 8 months c/o  numbness and pain in right foot only. Pt said right had pain foot all the time, but hurts more when walking. [facility]    HISTORY OF PRESENT ILLNESS:   63 year old male here for evaluation of right foot pain.  20-year history of diabetes.  History of hypertension, hyperlipidemia, coronary artery disease, peripheral arterial disease, tobacco abuse.  May 2024 patient had some chronic right hip pain issue and underwent right hip arthroplasty. In July 2024 patient presented to hospital due to new onset right foot pain.  He was diagnosed with community-acquired pneumonia and right foot/ankle cellulitis.  He was treated with antibiotics.  Symptoms continued after hospital discharge.  Went to podiatry for evaluation, was noted to have decreased pulse in the right foot.  He went to vascular surgery and was found to have some peripheral vascular disease, treated with endarterectomy and stenting.  Symptoms of right foot have continued.  He reports 8 out of 10 pain at rest.  Pain is 10 out of 10 when he stands and walks.  No pain above his ankle.  History of lumbar spine surgery x 4.  Some mild ongoing low back pain without radiating symptoms.    REVIEW OF SYSTEMS: Full 14 system review of systems performed and negative with exception of: as per HPI.  ALLERGIES: No Known Allergies  HOME MEDICATIONS: Outpatient Medications Prior to Visit  Medication Sig Dispense Refill   acetaminophen (TYLENOL) 650 MG CR tablet Take 1,300 mg by mouth daily as needed for pain.     albuterol (VENTOLIN HFA)  108 (90 Base) MCG/ACT inhaler Inhale 2 puffs into the lungs every 6 (six) hours as needed for wheezing or shortness of breath.     aspirin EC 81 MG EC tablet Take 1 tablet (81 mg total) by mouth daily.     Cholecalciferol (VITAMIN D-3 PO) Take 1 capsule by mouth daily.     clopidogrel (PLAVIX) 75 MG tablet Take 1 tablet (75 mg total) by mouth daily with breakfast. 30 tablet 11   cyclobenzaprine (FLEXERIL) 10 MG tablet Take 1 tablet (10 mg total) by mouth every 8 (eight) hours as needed for muscle spasms.     empagliflozin (JARDIANCE) 25 MG TABS tablet Take 25 mg by mouth daily.     gabapentin (NEURONTIN) 800 MG tablet Take 800 mg by mouth 3 (three) times daily.     glipiZIDE (GLUCOTROL XL) 10 MG 24 hr tablet Take 10 mg by mouth 2 (two) times daily.     icosapent Ethyl (VASCEPA) 1 g capsule Take 2 g by mouth 2 (two) times daily.     isosorbide mononitrate (IMDUR) 60 MG 24 hr tablet Take 1 tablet (60 mg total) by mouth daily. 30 tablet 2   losartan (COZAAR) 50 MG tablet Take 50 mg by mouth daily.     metoprolol succinate (TOPROL-XL) 25 MG 24 hr tablet TAKE 1 TABLET BY MOUTH ONCE A DAY. 90 tablet 3   ondansetron (ZOFRAN) 4 MG tablet Take 1 tablet (4 mg total) by mouth every 8 (eight) hours as needed for  nausea or vomiting. 10 tablet 0   oxyCODONE-acetaminophen (PERCOCET) 10-325 MG tablet Take 1 tablet by mouth 4 (four) times daily as needed for pain.     rosuvastatin (CRESTOR) 40 MG tablet Take 1 tablet (40 mg total) by mouth daily. 30 tablet 1   thiamine (VITAMIN B-1) 100 MG tablet Take 1 tablet (100 mg total) by mouth daily. 30 tablet 2   zolpidem (AMBIEN CR) 12.5 MG CR tablet Take 12.5 mg by mouth at bedtime.     cilostazol (PLETAL) 100 MG tablet Take by mouth. (Patient not taking: Reported on 09/03/2023)     folic acid (FOLVITE) 1 MG tablet Take 1 tablet (1 mg total) by mouth daily. (Patient not taking: Reported on 09/03/2023) 30 tablet 1   gabapentin (NEURONTIN) 600 MG tablet Take 800 mg by mouth  3 (three) times daily. (Patient not taking: Reported on 09/17/2023)     No facility-administered medications prior to visit.    PAST MEDICAL HISTORY: Past Medical History:  Diagnosis Date   Arthritis    "some; in hips sometimes" (01/02/2013)   Back pain    "w/prolonged walking or standing" (01/02/2013)   CAD (coronary artery disease)    CABG X 4 08/2012   COPD (chronic obstructive pulmonary disease) (HCC)    Elevated PSA    Esophageal erosions 05/24/2001   egd by Dr. Jena Gauss   GERD (gastroesophageal reflux disease)    H/O hiatal hernia    Headache    History of gout    Hyperlipidemia    Hypertension    not on any medication for htn at present   PAD (peripheral artery disease) (HCC)    LEA DOPPLER, 11/11/2012 - moderate arterial insufficiency to lower extremities at rest, BILATERAL SFA-demonstrates occlusive disease with reconstitution of flow   S/P percutaneous transluminal angioplasty (PTA) with stent placement 03/29/21 03/31/2021   Tobacco abuse    Tubular adenoma of colon 08/28/2011   Type II diabetes mellitus (HCC)    Unintentional weight loss     PAST SURGICAL HISTORY: Past Surgical History:  Procedure Laterality Date   ABDOMINAL AORTOGRAM W/LOWER EXTREMITY N/A 03/30/2021   Procedure: ABDOMINAL AORTOGRAM W/LOWER EXTREMITY;  Surgeon: Runell Gess, MD;  [facility]: [facility];  Service: Cardiovascular;  Laterality: N/A;   ABDOMINAL AORTOGRAM W/LOWER EXTREMITY N/A 04/12/2022   Procedure: ABDOMINAL AORTOGRAM W/LOWER EXTREMITY;  Surgeon: Runell Gess, MD;  Location: [facility];  Service: Cardiovascular;  Laterality: N/A;   ABDOMINAL AORTOGRAM W/LOWER EXTREMITY N/A 04/01/2023   Procedure: ABDOMINAL AORTOGRAM W/LOWER EXTREMITY;  Surgeon: Runell Gess, MD;  Location: [facility];  Service: Cardiovascular;  Laterality: N/A;   ANTERIOR CERVICAL DECOMP/DISCECTOMY FUSION  12/19/2011   Procedure: ANTERIOR CERVICAL DECOMPRESSION/DISCECTOMY FUSION 2  LEVELS;  Surgeon: Karn Cassis, MD;  Location: [facility];  Service: Neurosurgery;  Laterality: N/A;  Cervical four-five Cervical five-six  Anterior cervical decompression/diskectomy, fusion, plate   ATHERECTOMY N/A 01/02/2013   Procedure: ATHERECTOMY;  Surgeon: Runell Gess, MD;  Location: [facility] CATH LAB;  Service: Cardiovascular;  Laterality: N/A;   BACK SURGERY     x4   CARDIAC CATHETERIZATION  08/19/2012   CABG warranted; Severe ostial LCx stenosis with mid to distal vessel occlusion   COLONOSCOPY W/ POLYPECTOMY  08/28/2011   Rourk-tubular adenomas removed from ascending colon, suboptimal prep, diminutive rectal polyps   COLONOSCOPY WITH PROPOFOL N/A 06/13/2017   Procedure: COLONOSCOPY WITH PROPOFOL;  Surgeon: Corbin Ade, MD;  Location: [facility];  Service: Endoscopy;  Laterality: N/A;  9:15am   CORONARY ARTERY BYPASS GRAFT  08/22/2012   Procedure: CORONARY ARTERY BYPASS GRAFTING (CABG);  Surgeon: Delight Ovens, MD;  Location: [facility] OR;  Service: Open Heart Surgery;  Laterality: N/A;  CABG x four,  using left internal mammary artery and right leg greater saphenous vein harvested endoscopically   ENDARTERECTOMY FEMORAL Right 08/05/2023   Procedure: ENDARTERECTOMY FEMORAL with Profundoplasty;  Surgeon: Cephus Shelling, MD;  Location: [facility] OR;  Service: Vascular;  Laterality: Right;   ESOPHAGOGASTRODUODENOSCOPY  05/24/2001   by Dr. Jena Gauss   ESOPHAGOGASTRODUODENOSCOPY  08/28/2011   Rourk-erosive reflux esophagitis, gastric erosions   ESOPHAGOGASTRODUODENOSCOPY (EGD) WITH PROPOFOL N/A 06/13/2017   Procedure: ESOPHAGOGASTRODUODENOSCOPY (EGD) WITH PROPOFOL;  Surgeon: Corbin Ade, MD;  [facility]: [facility];  Service: Endoscopy;  Laterality: N/A;   INSERTION OF ILIAC STENT Right 08/05/2023   Procedure: Insertion of Iliac Stent with Iliac Arteriogram;  Surgeon: Cephus Shelling, MD;  Location: [facility] OR;  Service: Vascular;  Laterality: Right;   INTRAOPERATIVE  TRANSESOPHAGEAL ECHOCARDIOGRAM  08/22/2012   Procedure: INTRAOPERATIVE TRANSESOPHAGEAL ECHOCARDIOGRAM;  Surgeon: Delight Ovens, MD;  Location: [facility] OR;  Service: Open Heart Surgery;  Laterality: N/A;   IR 3D INDEPENDENT WKST  05/17/2022   IR ANGIO INTRA EXTRACRAN SEL INTERNAL CAROTID UNI R MOD SED  05/17/2022   IR ANGIO INTRA EXTRACRAN SEL INTERNAL CAROTID UNI R MOD SED  12/04/2022   IR ANGIOGRAM FOLLOW UP STUDY  05/17/2022   IR NEURO EACH ADD'L AFTER BASIC UNI RIGHT (MS)  05/17/2022   IR TRANSCATH/EMBOLIZ  05/17/2022   IR US GUIDE VASC ACCESS RIGHT  05/17/2022   IR US GUIDE VASC ACCESS RIGHT  12/04/2022   LEFT HEART CATHETERIZATION WITH CORONARY ANGIOGRAM N/A 08/19/2012   Procedure: LEFT HEART CATHETERIZATION WITH CORONARY ANGIOGRAM;  Surgeon: Chrystie Nose, MD;  Location: [facility];  Service: Cardiovascular;  Laterality: N/A;   LOWER EXTREMITY ANGIOGRAM N/A 11/17/2012   Procedure: LOWER EXTREMITY ANGIOGRAM;  Surgeon: Runell Gess, MD;  Location: [facility] CATH LAB;  Service: Cardiovascular;  Laterality: N/A;   LOWER EXTREMITY ANGIOGRAM N/A 02/02/2013   Procedure: LOWER EXTREMITY ANGIOGRAM;  Surgeon: Runell Gess, MD;  Location: [facility] CATH LAB;  Service: Cardiovascular;  Laterality: N/A;   LUMBAR DISC SURGERY     LUMBAR FUSION  ~ 2011   PATCH ANGIOPLASTY Right 08/05/2023   Procedure: PATCH ANGIOPLASTY;  Surgeon: Cephus Shelling, MD;  Location: [facility] OR;  Service: Vascular;  Laterality: Right;   PELVIC ABCESS DRAINAGE     x2   PERIPHERAL VASCULAR ATHERECTOMY  03/30/2021   Procedure: PERIPHERAL VASCULAR ATHERECTOMY;  Surgeon: Runell Gess, MD;  [facility];  Service: Cardiovascular;;  left common iliac artery   PERIPHERAL VASCULAR ATHERECTOMY  04/01/2023   Procedure: PERIPHERAL VASCULAR ATHERECTOMY;  Surgeon: Runell Gess, MD;  Location: [facility] INVASIVE CV LAB;  Service: Cardiovascular;;   PERIPHERAL VASCULAR BALLOON ANGIOPLASTY Left 04/12/2022   Procedure:  PERIPHERAL VASCULAR BALLOON ANGIOPLASTY;  Surgeon: Runell Gess, MD;  Location: [facility];  Service: Cardiovascular;  Laterality: Left;  SFA   PERIPHERAL VASCULAR BALLOON ANGIOPLASTY Right 04/01/2023   Procedure: PERIPHERAL VASCULAR BALLOON ANGIOPLASTY;  Surgeon: Runell Gess, MD;  Location: [facility];  Service: Cardiovascular;  Laterality: Right;  DCB - SFA   PERIPHERAL VASCULAR INTERVENTION  03/30/2021   Procedure: PERIPHERAL VASCULAR INTERVENTION;  Surgeon: Runell Gess, MD;  Location: [facility] INVASIVE  CV LAB;  Service: Cardiovascular;;   POLYPECTOMY  06/13/2017   Procedure: POLYPECTOMY;  Surgeon: Corbin Ade, MD;  [facility];  Service: Endoscopy;;  colon   RADIOLOGY WITH ANESTHESIA N/A 05/17/2022   Procedure: Diagnostic Cerebral Angiogram, Possible Aneurysm Coiling, Possible Stent;  Surgeon: Lisbeth Renshaw, MD;  Location: [facility] OR;  Service: Radiology;  Laterality: N/A;   SHOULDER SURGERY Right    TOTAL HIP ARTHROPLASTY Right 12/25/2022   Procedure: TOTAL HIP ARTHROPLASTY;  Surgeon: Teryl Lucy, MD;  Location: [facility] ORS;  Service: Orthopedics;  Laterality: Right;   TOTAL HIP ARTHROPLASTY Right    VASCULAR SURGERY Right 01/02/2013   diamondback orbital rotational  atherectomy, chocolate balloon and IDEV  Stent.    FAMILY HISTORY: Family History  Problem Relation Age of Onset   Anesthesia problems Neg Hx    Colon cancer Neg Hx    Gastric cancer Neg Hx    Esophageal cancer Neg Hx    Colon polyps Neg Hx     SOCIAL HISTORY: Social History   Socioeconomic History   Marital status: Single    Spouse name: Not on file   Number of children: 1   Years of education: Not on file   Highest education level: Not on file  Occupational History   Occupation: disabled    Employer: DISABLED  Tobacco Use   Smoking status: Every Day    Current packs/day: 1.50    Average packs/day: 1.5 packs/day for 39.0 years (58.5 ttl pk-yrs)    Types: Cigarettes    Smokeless tobacco: Never  Vaping Use   Vaping status: Never Used  Substance and Sexual Activity   Alcohol use: Yes    Alcohol/week: 12.0 standard drinks of alcohol    Types: 12 Cans of beer per week    Comment: weekly   Drug use: No   Sexual activity: Not Currently  Other Topics Concern   Not on file  Social History Narrative   Lives w/ girlfriend      Right handed   Wears glasses    Drinks sweet tea (rare)   Drinks diet mountain dew    Social Drivers of Health   Financial Resource Strain: Low Risk  (03/20/2023)   Received from [facility]   Overall Financial Resource Strain (CARDIA)    Difficulty of Paying Living Expenses: Not very hard  Food Insecurity: No Food Insecurity (08/05/2023)   Hunger Vital Sign    Worried About Running Out of Food in the Last Year: Never true    Ran Out of Food in the Last Year: Never true  Transportation Needs: No Transportation Needs (08/05/2023)   PRAPARE - Administrator, Civil Service (Medical): No    Lack of Transportation (Non-Medical): No  Physical Activity: Unknown (03/20/2023)   Received from [facility]   Exercise Vital Sign    Days of Exercise per Week: 0 days    Minutes of Exercise per Session: Not on file  Stress: No Stress Concern Present (03/20/2023)   Received from [facility] - Occupational Stress Questionnaire    Feeling of Stress : Not at all  Social Connections: Moderately Integrated (03/20/2023)   Received from [facility]   Social Network    How would you rate your social network (family, work, friends)?: Adequate participation with social networks  Intimate Partner Violence: Not At Risk (08/05/2023)   Humiliation, Afraid, Rape, and Kick questionnaire    Fear of Current or  Ex-Partner: No    Emotionally Abused: No    Physically Abused: No    Sexually Abused: No     PHYSICAL EXAM  GENERAL EXAM/CONSTITUTIONAL: Vitals:  Vitals:   09/17/23 1323  BP:  121/67  Pulse: 78  Weight: 192 lb (87.1 kg)  Height: 5\' 11"  (1.803 m)   Body mass index is 26.78 kg/m. Wt Readings from Last 3 Encounters:  09/17/23 192 lb (87.1 kg)  09/03/23 196 lb 8 oz (89.1 kg)  08/05/23 192 lb (87.1 kg)   Patient is in no distress; well developed, nourished and groomed; neck is supple  CARDIOVASCULAR: Examination of carotid arteries is normal; no carotid bruits Regular rate and rhythm, no murmurs Examination of peripheral vascular system by observation and palpation is normal  EYES: Ophthalmoscopic exam of optic discs and posterior segments is normal; no papilledema or hemorrhages No results found.  MUSCULOSKELETAL: Gait, strength, tone, movements noted in Neurologic exam below  NEUROLOGIC: MENTAL STATUS:      No data to display         awake, alert, oriented to person, place and time recent and remote memory intact normal attention and concentration language fluent, comprehension intact, naming intact fund of knowledge appropriate  CRANIAL NERVE:  2nd - no papilledema on fundoscopic exam 2nd, 3rd, 4th, 6th - pupils equal and reactive to light, visual fields full to confrontation, extraocular muscles intact, no nystagmus 5th - facial sensation symmetric 7th - facial strength symmetric 8th - hearing intact 9th - palate elevates symmetrically, uvula midline 11th - shoulder shrug symmetric 12th - tongue protrusion midline  MOTOR:  normal bulk and tone, full strength in the BUE, BLE  SENSORY:  normal and symmetric to light touch; SEVERELY DECR IN FEET AND HANDS  COORDINATION:  finger-nose-finger, fine finger movements normal  REFLEXES:  deep tendon reflexes ABSENT and symmetric  GAIT/STATION:  narrow based gait; LIMPING ON RIGHT LEG     DIAGNOSTIC DATA (LABS, IMAGING, TESTING) - I reviewed patient records, labs, notes, testing and imaging myself where available.  Lab Results  Component Value Date   WBC 11.7 (H) 08/06/2023    HGB 13.4 08/06/2023   HCT 40.4 08/06/2023   MCV 95.3 08/06/2023   PLT 148 (L) 08/06/2023      Component Value Date/Time   NA 130 (L) 08/06/2023 0430   NA 141 03/06/2023 1224   K 4.9 08/06/2023 0430   CL 100 08/06/2023 0430   CO2 22 08/06/2023 0430   GLUCOSE 437 (H) 08/06/2023 0430   BUN 23 08/06/2023 0430   BUN 15 06/28/2023 1524   CREATININE 1.20 08/06/2023 0430   CREATININE 0.80 09/09/2013 0953   CALCIUM 8.2 (L) 08/06/2023 0430   PROT 7.4 07/26/2023 1401   ALBUMIN 3.9 07/26/2023 1401   AST 22 07/26/2023 1401   ALT 18 07/26/2023 1401   ALKPHOS 71 07/26/2023 1401   BILITOT 0.5 07/26/2023 1401   GFRNONAA >60 08/06/2023 0430   GFRNONAA >89 09/09/2013 0953   GFRAA >60 09/10/2018 0606   GFRAA >89 09/09/2013 0953   Lab Results  Component Value Date   CHOL 106 08/06/2023   HDL 41 08/06/2023   LDLCALC 9 08/06/2023   TRIG 282 (H) 08/06/2023   CHOLHDL 2.6 08/06/2023   Lab Results  Component Value Date   HGBA1C 7.2 (H) 07/26/2023   No results found for: "VITAMINB12" Lab Results  Component Value Date   TSH 1.762 01/17/2012    07/20/21 MRI lumbar spine -Postoperative changes at L3-L4 and  L4-L5. Enhancing probable scar tissue along the left neural foramina at L3-L4 which could potentially result in exiting nerve root impingement. Moderate left and mild right neural foraminal narrowing at L4-L5. - Moderate left-sided neural foraminal stenosis at L5-S1 due to leftward disc protrusion and mild facet arthropathy. -Mild facet arthropathy and disc bulging at L1-L2 and L2-L3 without significant stenosis.    ASSESSMENT AND PLAN  63 y.o. year old male here with hypertension, hyperlipidemia, coronary artery disease, peripheral arterial disease, tobacco abuse.   Dx:  1. Right foot pain      PLAN:  Chronic right foot pain (since July 2024; diabetes; possible right foot / ankle cellulitis at that time) - check right foot xray; check MRI right foot - check labs -  continue oxycodone and gabapentin; follow up with pain mgmt /PCP  Orders Placed This Encounter  Procedures   DG Foot Complete Right   MR FOOT RIGHT W WO CONTRAST   ANA,IFA RA Diag Pnl w/rflx Tit/Patn   Uric Acid   C-reactive Protein   Sedimentation Rate   Return for pending if symptoms worsen or fail to improve, pending test results.    Suanne Marker, MD 09/17/2023, 2:35 PM Certified in Neurology, Neurophysiology and Neuroimaging  [facility] [address], [address] [place], [place] [address] [phone]

## 2023-09-17 NOTE — Patient Instructions (Signed)
Chronic right foot pain (since July 2024; diabetes; possible right foot / ankle cellulitis at that time) - check right foot xray; check MRI right foot; check labs - continue oxycodone and gabapentin

## 2023-09-19 ENCOUNTER — Other Ambulatory Visit: Payer: Self-pay

## 2023-09-19 DIAGNOSIS — I70221 Atherosclerosis of native arteries of extremities with rest pain, right leg: Secondary | ICD-10-CM

## 2023-09-20 DIAGNOSIS — J0100 Acute maxillary sinusitis, unspecified: Secondary | ICD-10-CM | POA: Diagnosis not present

## 2023-09-20 DIAGNOSIS — M48062 Spinal stenosis, lumbar region with neurogenic claudication: Secondary | ICD-10-CM | POA: Diagnosis not present

## 2023-09-20 DIAGNOSIS — I739 Peripheral vascular disease, unspecified: Secondary | ICD-10-CM | POA: Diagnosis not present

## 2023-09-22 LAB — ANA,IFA RA DIAG PNL W/RFLX TIT/PATN
ANA Titer 1: NEGATIVE
Cyclic Citrullin Peptide Ab: 6 U (ref 0–19)
Rheumatoid fact SerPl-aCnc: <10 [IU]/mL (ref <14.0)

## 2023-09-22 LAB — C-REACTIVE PROTEIN: CRP: 48 mg/L — ABNORMAL HIGH (ref 0–10)

## 2023-09-22 LAB — URIC ACID: Uric Acid: 5.4 mg/dL (ref 3.8–8.4)

## 2023-09-22 LAB — SEDIMENTATION RATE: Sed Rate: 55 mm/h — ABNORMAL HIGH (ref 0–30)

## 2023-09-26 ENCOUNTER — Ambulatory Visit (HOSPITAL_COMMUNITY): Admission: RE | Admit: 2023-09-26 | Payer: 59 | Source: Ambulatory Visit

## 2023-09-29 ENCOUNTER — Ambulatory Visit (HOSPITAL_COMMUNITY)
Admission: RE | Admit: 2023-09-29 | Discharge: 2023-09-29 | Disposition: A | Discharge status: Home / Self Care | Payer: 59 | Source: Ambulatory Visit | Attending: Diagnostic Neuroimaging | Admitting: Diagnostic Neuroimaging

## 2023-09-29 DIAGNOSIS — R600 Localized edema: Secondary | ICD-10-CM | POA: Diagnosis not present

## 2023-09-29 DIAGNOSIS — M19071 Primary osteoarthritis, right ankle and foot: Secondary | ICD-10-CM | POA: Diagnosis not present

## 2023-09-29 DIAGNOSIS — M79671 Pain in right foot: Secondary | ICD-10-CM | POA: Diagnosis not present

## 2023-09-29 DIAGNOSIS — M7731 Calcaneal spur, right foot: Secondary | ICD-10-CM | POA: Diagnosis not present

## 2023-09-29 DIAGNOSIS — L03115 Cellulitis of right lower limb: Secondary | ICD-10-CM | POA: Diagnosis not present

## 2023-09-29 MED ORDER — GADOBUTROL 1 MMOL/ML IV SOLN
9.0000 mL | Freq: Once | INTRAVENOUS | Status: AC | PRN
  Administered 2023-09-29: 9 mL via INTRAVENOUS

## 2023-09-30 ENCOUNTER — Ambulatory Visit (HOSPITAL_COMMUNITY): Admission: RE | Admit: 2023-09-30 | Payer: 59 | Source: Ambulatory Visit

## 2023-09-30 ENCOUNTER — Telehealth: Payer: Self-pay | Admitting: Neurology

## 2023-09-30 NOTE — Progress Notes (Signed)
 Mild arthritis changes. -VRP

## 2023-09-30 NOTE — Telephone Encounter (Signed)
 Called the patient and reviewed the urgent results with him. Advised that Dr Marjory Lies would recommend that the pt reach out to PCP asap and determine next steps. Pt has been established with Dr Dion Saucier at [facility]. Advised I would route the result to PCP, ortho MD, vascular surgeon and foot doc.  Pt verbalized understanding. Pt had no questions at this time but was encouraged to call back if questions arise.

## 2023-09-30 NOTE — Progress Notes (Signed)
 Right foot cellulitis. Needs asap follow up with PCP and orthopedics.

## 2023-09-30 NOTE — Telephone Encounter (Signed)
-----   Message from [name] [facility] sent at 09/30/2023  8:21 AM EST ----- Right foot cellulitis. Needs asap follow up with PCP and orthopedics.

## 2023-10-01 ENCOUNTER — Telehealth: Payer: Self-pay

## 2023-10-01 DIAGNOSIS — G4701 Insomnia due to medical condition: Secondary | ICD-10-CM | POA: Diagnosis not present

## 2023-10-01 DIAGNOSIS — I70221 Atherosclerosis of native arteries of extremities with rest pain, right leg: Secondary | ICD-10-CM | POA: Diagnosis not present

## 2023-10-01 DIAGNOSIS — M48062 Spinal stenosis, lumbar region with neurogenic claudication: Secondary | ICD-10-CM | POA: Diagnosis not present

## 2023-10-01 DIAGNOSIS — E1143 Type 2 diabetes mellitus with diabetic autonomic (poly)neuropathy: Secondary | ICD-10-CM | POA: Diagnosis not present

## 2023-10-01 DIAGNOSIS — M86271 Subacute osteomyelitis, right ankle and foot: Secondary | ICD-10-CM | POA: Diagnosis not present

## 2023-10-01 NOTE — Telephone Encounter (Signed)
-----   Message from Vivi Barrack sent at 09/30/2023  2:47 PM EST ----- Thanks for letting me know. Shelly, can you please get this patient in ASAP? Thanks! ----- Message ----- From: Judi Cong, RN Sent: 09/30/2023  11:13 AM EST To: Vivi Barrack, DPM  SEE URGENT MRI RESULT. (Report also sent to pt PCP, vascular surgeon and orthopedic)

## 2023-10-01 NOTE — Telephone Encounter (Signed)
 Left a message on 09/30/2023 & 10/01/2023 for Zev to call and schedule an appointment with Dr. Ardelle Anton as soon as possible.

## 2023-10-02 DIAGNOSIS — M869 Osteomyelitis, unspecified: Secondary | ICD-10-CM | POA: Diagnosis not present

## 2023-10-07 DIAGNOSIS — L03031 Cellulitis of right toe: Secondary | ICD-10-CM | POA: Diagnosis not present

## 2023-10-07 DIAGNOSIS — B351 Tinea unguium: Secondary | ICD-10-CM | POA: Diagnosis not present

## 2023-10-07 DIAGNOSIS — E1142 Type 2 diabetes mellitus with diabetic polyneuropathy: Secondary | ICD-10-CM | POA: Diagnosis not present

## 2023-10-07 DIAGNOSIS — M79671 Pain in right foot: Secondary | ICD-10-CM | POA: Diagnosis not present

## 2023-10-07 DIAGNOSIS — M792 Neuralgia and neuritis, unspecified: Secondary | ICD-10-CM | POA: Diagnosis not present

## 2023-10-07 DIAGNOSIS — L600 Ingrowing nail: Secondary | ICD-10-CM | POA: Diagnosis not present

## 2023-10-07 DIAGNOSIS — I739 Peripheral vascular disease, unspecified: Secondary | ICD-10-CM | POA: Diagnosis not present

## 2023-10-08 NOTE — Telephone Encounter (Signed)
 I contacted pt to see if he has followed up with anyone yet. He stated he saw his foot doc yesterday. Per chart review pt has seen PCP and inf disease. Has upcoming appt with ID 10/14/23.

## 2023-10-16 ENCOUNTER — Ambulatory Visit: Payer: 59 | Admitting: Cardiovascular Disease

## 2023-10-21 DIAGNOSIS — E1142 Type 2 diabetes mellitus with diabetic polyneuropathy: Secondary | ICD-10-CM | POA: Diagnosis not present

## 2023-10-21 DIAGNOSIS — M869 Osteomyelitis, unspecified: Secondary | ICD-10-CM | POA: Diagnosis not present

## 2023-10-21 DIAGNOSIS — L600 Ingrowing nail: Secondary | ICD-10-CM | POA: Diagnosis not present

## 2023-10-21 DIAGNOSIS — I739 Peripheral vascular disease, unspecified: Secondary | ICD-10-CM | POA: Diagnosis not present

## 2023-10-21 DIAGNOSIS — L03031 Cellulitis of right toe: Secondary | ICD-10-CM | POA: Diagnosis not present

## 2023-10-25 DIAGNOSIS — M545 Low back pain, unspecified: Secondary | ICD-10-CM | POA: Diagnosis not present

## 2023-10-25 DIAGNOSIS — M25551 Pain in right hip: Secondary | ICD-10-CM | POA: Diagnosis not present

## 2023-11-26 ENCOUNTER — Other Ambulatory Visit (HOSPITAL_COMMUNITY): Payer: Self-pay | Admitting: Neurosurgery

## 2023-11-26 DIAGNOSIS — M48062 Spinal stenosis, lumbar region with neurogenic claudication: Secondary | ICD-10-CM

## 2023-12-02 ENCOUNTER — Ambulatory Visit (HOSPITAL_COMMUNITY)
Admission: RE | Admit: 2023-12-02 | Discharge: 2023-12-02 | Disposition: A | Discharge status: Home / Self Care | Source: Ambulatory Visit | Attending: Neurosurgery | Admitting: Neurosurgery

## 2023-12-02 DIAGNOSIS — M51369 Other intervertebral disc degeneration, lumbar region without mention of lumbar back pain or lower extremity pain: Secondary | ICD-10-CM | POA: Diagnosis not present

## 2023-12-02 DIAGNOSIS — M48062 Spinal stenosis, lumbar region with neurogenic claudication: Secondary | ICD-10-CM | POA: Insufficient documentation

## 2023-12-02 DIAGNOSIS — M47816 Spondylosis without myelopathy or radiculopathy, lumbar region: Secondary | ICD-10-CM | POA: Diagnosis not present

## 2023-12-02 DIAGNOSIS — M51379 Other intervertebral disc degeneration, lumbosacral region without mention of lumbar back pain or lower extremity pain: Secondary | ICD-10-CM | POA: Diagnosis not present

## 2023-12-02 DIAGNOSIS — M48061 Spinal stenosis, lumbar region without neurogenic claudication: Secondary | ICD-10-CM | POA: Diagnosis not present

## 2023-12-06 ENCOUNTER — Telehealth: Payer: Self-pay | Admitting: Gastroenterology

## 2023-12-06 NOTE — Telephone Encounter (Signed)
 Pt left us  message called pt back no answer left voicemail

## 2023-12-10 ENCOUNTER — Ambulatory Visit: Admitting: Vascular Surgery

## 2023-12-10 ENCOUNTER — Ambulatory Visit: Payer: 59 | Admitting: Vascular Surgery

## 2023-12-10 ENCOUNTER — Encounter

## 2023-12-13 NOTE — Progress Notes (Unsigned)
 Referring Provider: Lorre Rosin, NP Primary Care Physician:  Lorre Rosin, NP Primary GI Physician: Dr. Riley Cheadle  No chief complaint on file.   HPI:   [name] is a 63 y.o. male presenting today to discuss rescheduling surveillance colonoscopy.  He has a history significant for severe PAD s/p multiple interventions with most recent in December 2024 chronically on aspirin , Pletal, and Plavix , history of CAD s/p CABG x 4 in 2014, HTN, HLD, type 2 diabetes, COPD, GERD adenomatous colon polyps.  Last colonoscopy 06/13/2017: -5 mm polyp in the sigmoid colon (hyperplastic) -Internal hemorrhoids -Repeat colonoscopy in 5 years   Patient was originally scheduled for colonoscopy 10/23/2022, but canceled.  Today:    Past Medical History:  Diagnosis Date   Arthritis    "some; in hips sometimes" (01/02/2013)   Back pain    "w/prolonged walking or standing" (01/02/2013)   CAD (coronary artery disease)    CABG X 4 08/2012   COPD (chronic obstructive pulmonary disease) (HCC)    Elevated PSA    Esophageal erosions 05/24/2001   egd by Dr. Riley Cheadle   GERD (gastroesophageal reflux disease)    H/O hiatal hernia    Headache    History of gout    Hyperlipidemia    Hypertension    not on any medication for htn at present   PAD (peripheral artery disease) (HCC)    LEA DOPPLER, 11/11/2012 - moderate arterial insufficiency to lower extremities at rest, BILATERAL SFA-demonstrates occlusive disease with reconstitution of flow   S/P percutaneous transluminal angioplasty (PTA) with stent placement 03/29/21 03/31/2021   Tobacco abuse    Tubular adenoma of colon 08/28/2011   Type II diabetes mellitus (HCC)    Unintentional weight loss     Past Surgical History:  Procedure Laterality Date   ABDOMINAL AORTOGRAM W/LOWER EXTREMITY N/A 03/30/2021   Procedure: ABDOMINAL AORTOGRAM W/LOWER EXTREMITY;  Surgeon: Avanell Leigh, MD;  [facility];  Service: Cardiovascular;   Laterality: N/A;   ABDOMINAL AORTOGRAM W/LOWER EXTREMITY N/A 04/12/2022   Procedure: ABDOMINAL AORTOGRAM W/LOWER EXTREMITY;  Surgeon: Avanell Leigh, MD;  [facility]: [facility];  Service: Cardiovascular;  Laterality: N/A;   ABDOMINAL AORTOGRAM W/LOWER EXTREMITY N/A 04/01/2023   Procedure: ABDOMINAL AORTOGRAM W/LOWER EXTREMITY;  Surgeon: Avanell Leigh, MD;  [facility]: [facility];  Service: Cardiovascular;  Laterality: N/A;   ANTERIOR CERVICAL DECOMP/DISCECTOMY FUSION  12/19/2011   Procedure: ANTERIOR CERVICAL DECOMPRESSION/DISCECTOMY FUSION 2 LEVELS;  Surgeon: Adelbert Adler, MD;  Location: [facility];  Service: Neurosurgery;  Laterality: N/A;  Cervical four-five Cervical five-six  Anterior cervical decompression/diskectomy, fusion, plate   ATHERECTOMY N/A 01/02/2013   Procedure: ATHERECTOMY;  Surgeon: Avanell Leigh, MD;  Location: [facility] CATH LAB;  Service: Cardiovascular;  Laterality: N/A;   BACK SURGERY     x4   CARDIAC CATHETERIZATION  08/19/2012   CABG warranted; Severe ostial LCx stenosis with mid to distal vessel occlusion   COLONOSCOPY W/ POLYPECTOMY  08/28/2011   Rourk-tubular adenomas removed from ascending colon, suboptimal prep, diminutive rectal polyps   COLONOSCOPY WITH PROPOFOL  N/A 06/13/2017   Procedure: COLONOSCOPY WITH PROPOFOL ;  Surgeon: Suzette Espy, MD;  [facility]: [facility];  Service: Endoscopy;  Laterality: N/A;  9:15am   CORONARY ARTERY BYPASS GRAFT  08/22/2012   Procedure: CORONARY ARTERY BYPASS GRAFTING (CABG);  Surgeon: Norita Beauvais, MD;  Location: [facility] OR;  Service: Open Heart Surgery;  Laterality: N/A;  CABG x four,  using left internal mammary artery and right leg greater saphenous vein harvested endoscopically   ENDARTERECTOMY FEMORAL Right 08/05/2023   Procedure: ENDARTERECTOMY FEMORAL with Profundoplasty;  Surgeon: Young Hensen, MD;  Location: [facility] OR;  Service: Vascular;  Laterality: Right;   ESOPHAGOGASTRODUODENOSCOPY   05/24/2001   by Dr. Riley Cheadle   ESOPHAGOGASTRODUODENOSCOPY  08/28/2011   Rourk-erosive reflux esophagitis, gastric erosions   ESOPHAGOGASTRODUODENOSCOPY (EGD) WITH PROPOFOL  N/A 06/13/2017   Procedure: ESOPHAGOGASTRODUODENOSCOPY (EGD) WITH PROPOFOL ;  Surgeon: Suzette Espy, MD;  [facility];  Service: Endoscopy;  Laterality: N/A;   INSERTION OF ILIAC STENT Right 08/05/2023   Procedure: Insertion of Iliac Stent with Iliac Arteriogram;  Surgeon: Young Hensen, MD;  Location: [facility] OR;  Service: Vascular;  Laterality: Right;   INTRAOPERATIVE TRANSESOPHAGEAL ECHOCARDIOGRAM  08/22/2012   Procedure: INTRAOPERATIVE TRANSESOPHAGEAL ECHOCARDIOGRAM;  Surgeon: Norita Beauvais, MD;  Location: [facility] OR;  Service: Open Heart Surgery;  Laterality: N/A;   IR 3D INDEPENDENT WKST  05/17/2022   IR ANGIO INTRA EXTRACRAN SEL INTERNAL CAROTID UNI R MOD SED  05/17/2022   IR ANGIO INTRA EXTRACRAN SEL INTERNAL CAROTID UNI R MOD SED  12/04/2022   IR ANGIOGRAM FOLLOW UP STUDY  05/17/2022   IR NEURO EACH ADD'L AFTER BASIC UNI RIGHT (MS)  05/17/2022   IR TRANSCATH/EMBOLIZ  05/17/2022   IR US  GUIDE VASC ACCESS RIGHT  05/17/2022   IR US  GUIDE VASC ACCESS RIGHT  12/04/2022   LEFT HEART CATHETERIZATION WITH CORONARY ANGIOGRAM N/A 08/19/2012   Procedure: LEFT HEART CATHETERIZATION WITH CORONARY ANGIOGRAM;  Surgeon: Hazle Lites, MD;  [facility]: [facility] CATH [facility];  Service: Cardiovascular;  Laterality: N/A;   LOWER EXTREMITY ANGIOGRAM N/A 11/17/2012   Procedure: LOWER EXTREMITY ANGIOGRAM;  Surgeon: Avanell Leigh, MD;  Location: [facility] CATH LAB;  Service: Cardiovascular;  Laterality: N/A;   LOWER EXTREMITY ANGIOGRAM N/A 02/02/2013   Procedure: LOWER EXTREMITY ANGIOGRAM;  Surgeon: Avanell Leigh, MD;  Location: [facility] CATH LAB;  Service: Cardiovascular;  Laterality: N/A;   LUMBAR DISC SURGERY     LUMBAR FUSION  ~ 2011   PATCH ANGIOPLASTY Right 08/05/2023   Procedure: PATCH ANGIOPLASTY;  Surgeon: Young Hensen,  MD;  Location: [facility] OR;  Service: Vascular;  Laterality: Right;   PELVIC ABCESS DRAINAGE     x2   PERIPHERAL VASCULAR ATHERECTOMY  03/30/2021   Procedure: PERIPHERAL VASCULAR ATHERECTOMY;  Surgeon: Avanell Leigh, MD;  [facility];  Service: Cardiovascular;;  left common iliac artery   PERIPHERAL VASCULAR ATHERECTOMY  04/01/2023   Procedure: PERIPHERAL VASCULAR ATHERECTOMY;  Surgeon: Avanell Leigh, MD;  Location: [facility] INVASIVE CV LAB;  Service: Cardiovascular;;   PERIPHERAL VASCULAR BALLOON ANGIOPLASTY Left 04/12/2022   Procedure: PERIPHERAL VASCULAR BALLOON ANGIOPLASTY;  Surgeon: Avanell Leigh, MD;  [facility];  Service: Cardiovascular;  Laterality: Left;  SFA   PERIPHERAL VASCULAR BALLOON ANGIOPLASTY Right 04/01/2023   Procedure: PERIPHERAL VASCULAR BALLOON ANGIOPLASTY;  Surgeon: Avanell Leigh, MD;  [facility];  Service: Cardiovascular;  Laterality: Right;  DCB - SFA   PERIPHERAL VASCULAR INTERVENTION  03/30/2021   Procedure: PERIPHERAL VASCULAR INTERVENTION;  Surgeon: Avanell Leigh, MD;  [facility];  Service: Cardiovascular;;   POLYPECTOMY  06/13/2017   Procedure: POLYPECTOMY;  Surgeon: Suzette Espy, MD;  [facility];  Service: Endoscopy;;  colon   RADIOLOGY WITH ANESTHESIA N/A 05/17/2022   Procedure: Diagnostic Cerebral Angiogram, Possible Aneurysm Coiling, Possible Stent;  Surgeon: Augusto Blonder, MD;  Location: [facility] OR;  Service: Radiology;  Laterality: N/A;   SHOULDER SURGERY Right    TOTAL HIP ARTHROPLASTY Right 12/25/2022   Procedure: TOTAL HIP ARTHROPLASTY;  Surgeon: Osa Blase, MD;  Location: [facility] ORS;  Service: Orthopedics;  Laterality: Right;   TOTAL HIP ARTHROPLASTY Right    VASCULAR SURGERY Right 01/02/2013   diamondback orbital rotational  atherectomy, chocolate balloon and IDEV  Stent.    Current Outpatient Medications  Medication Sig Dispense Refill   acetaminophen   (TYLENOL ) 650 MG CR tablet Take 1,300 mg by mouth daily as needed for pain.     albuterol  (VENTOLIN  HFA) 108 (90 Base) MCG/ACT inhaler Inhale 2 puffs into the lungs every 6 (six) hours as needed for wheezing or shortness of breath.     aspirin  EC 81 MG EC tablet Take 1 tablet (81 mg total) by mouth daily.     Cholecalciferol (VITAMIN D-3 PO) Take 1 capsule by mouth daily.     cilostazol (PLETAL) 100 MG tablet Take by mouth. (Patient not taking: Reported on 09/03/2023)     clopidogrel  (PLAVIX ) 75 MG tablet Take 1 tablet (75 mg total) by mouth daily with breakfast. 30 tablet 11   cyclobenzaprine  (FLEXERIL ) 10 MG tablet Take 1 tablet (10 mg total) by mouth every 8 (eight) hours as needed for muscle spasms.     empagliflozin  (JARDIANCE ) 25 MG TABS tablet Take 25 mg by mouth daily.     folic acid  (FOLVITE ) 1 MG tablet Take 1 tablet (1 mg total) by mouth daily. (Patient not taking: Reported on 09/03/2023) 30 tablet 1   gabapentin  (NEURONTIN ) 600 MG tablet Take 800 mg by mouth 3 (three) times daily. (Patient not taking: Reported on 09/17/2023)     gabapentin  (NEURONTIN ) 800 MG tablet Take 800 mg by mouth 3 (three) times daily.     glipiZIDE  (GLUCOTROL  XL) 10 MG 24 hr tablet Take 10 mg by mouth 2 (two) times daily.     icosapent  Ethyl (VASCEPA ) 1 g capsule Take 2 g by mouth 2 (two) times daily.     isosorbide  mononitrate (IMDUR ) 60 MG 24 hr tablet Take 1 tablet (60 mg total) by mouth daily. 30 tablet 2   losartan  (COZAAR ) 50 MG tablet Take 50 mg by mouth daily.     metoprolol  succinate (TOPROL -XL) 25 MG 24 hr tablet TAKE 1 TABLET BY MOUTH ONCE A DAY. 90 tablet 3   ondansetron  (ZOFRAN ) 4 MG tablet Take 1 tablet (4 mg total) by mouth every 8 (eight) hours as needed for nausea or vomiting. 10 tablet 0   oxyCODONE -acetaminophen  (PERCOCET) 10-325 MG tablet Take 1 tablet by mouth 4 (four) times daily as needed for pain.     rosuvastatin  (CRESTOR ) 40 MG tablet Take 1 tablet (40 mg total) by mouth daily. 30 tablet 1    thiamine  (VITAMIN B-1) 100 MG tablet Take 1 tablet (100 mg total) by mouth daily. 30 tablet 2   zolpidem  (AMBIEN  CR) 12.5 MG CR tablet Take 12.5 mg by mouth at bedtime.     No current facility-administered medications for this visit.    Allergies as of 12/16/2023   (No Known Allergies)    Family History  Problem Relation Age of Onset   Anesthesia problems Neg Hx    Colon cancer Neg Hx    Gastric cancer Neg Hx    Esophageal cancer Neg Hx    Colon polyps Neg Hx     Social History   Socioeconomic History  Marital status: Single    Spouse name: Not on file   Number of children: 1   Years of education: Not on file   Highest education level: Not on file  Occupational History   Occupation: disabled    Employer: DISABLED  Tobacco Use   Smoking status: Every Day    Current packs/day: 1.50    Average packs/day: 1.5 packs/day for 39.0 years (58.5 ttl pk-yrs)    Types: Cigarettes   Smokeless tobacco: Never  Vaping Use   Vaping status: Never Used  Substance and Sexual Activity   Alcohol use: Yes    Alcohol/week: 12.0 standard drinks of alcohol    Types: 12 Cans of beer per week    Comment: weekly   Drug use: No   Sexual activity: Not Currently  Other Topics Concern   Not on file  Social History Narrative   Lives w/ girlfriend      Right handed   Wears glasses    Drinks sweet tea (rare)   Drinks diet mountain dew    Social Drivers of Health   Financial Resource Strain: Low Risk  (09/20/2023)   Received from [facility]   Overall Financial Resource Strain (CARDIA)    Difficulty of Paying Living Expenses: Not hard at all  Food Insecurity: No Food Insecurity (09/20/2023)   Received from [facility]   Hunger Vital Sign    Worried About Running Out of Food in the Last Year: Never true    Ran Out of Food in the Last Year: Never true  Transportation Needs: No Transportation Needs (09/20/2023)   Received from [facility] - Transportation    Lack of  Transportation (Medical): No    Lack of Transportation (Non-Medical): No  Physical Activity: Unknown (03/20/2023)   Received from [facility]   Exercise Vital Sign    Days of Exercise per Week: 0 days    Minutes of Exercise per Session: Not on file  Stress: No Stress Concern Present (03/20/2023)   Received from [facility] - Occupational Stress Questionnaire    Feeling of Stress : Not at all  Social Connections: Moderately Integrated (03/20/2023)   Received from [facility]   Social Network    How would you rate your social network (family, work, friends)?: Adequate participation with social networks    Review of Systems: Gen: Denies fever, chills, anorexia. Denies fatigue, weakness, weight loss.  CV: Denies chest pain, palpitations, syncope, peripheral edema, and claudication. Resp: Denies dyspnea at rest, cough, wheezing, coughing up blood, and pleurisy. GI: Denies vomiting blood, jaundice, and fecal incontinence.   Denies dysphagia or odynophagia. Derm: Denies rash, itching, dry skin Psych: Denies depression, anxiety, memory loss, confusion. No homicidal or suicidal ideation.  Heme: Denies bruising, bleeding, and enlarged lymph nodes.  Physical Exam: There were no vitals taken for this visit. General:   Alert and oriented. No distress noted. Pleasant and cooperative.  Head:  Normocephalic and atraumatic. Eyes:  Conjuctiva clear without scleral icterus. Heart:  S1, S2 present without murmurs appreciated. Lungs:  Clear to auscultation bilaterally. No wheezes, rales, or rhonchi. No distress.  Abdomen:  +BS, soft, non-tender and non-distended. No rebound or guarding. No HSM or masses noted. Msk:  Symmetrical without gross deformities. Normal posture. Extremities:  Without edema. Neurologic:  Alert and  oriented x4 Psych:  Normal mood and affect.    Assessment:     Plan:  ***   Shana Daring,  PA-C [facility]  Gastroenterology 12/16/2023

## 2023-12-16 ENCOUNTER — Ambulatory Visit (INDEPENDENT_AMBULATORY_CARE_PROVIDER_SITE_OTHER): Admitting: Gastroenterology

## 2023-12-16 ENCOUNTER — Encounter: Payer: Self-pay | Admitting: *Deleted

## 2023-12-16 ENCOUNTER — Other Ambulatory Visit: Payer: Self-pay | Admitting: *Deleted

## 2023-12-16 ENCOUNTER — Encounter: Payer: Self-pay | Admitting: Gastroenterology

## 2023-12-16 VITALS — BP 137/76 | HR 79 | Temp 97.9°F | Ht 71.0 in | Wt 193.0 lb

## 2023-12-16 DIAGNOSIS — Z860100 Personal history of colon polyps, unspecified: Secondary | ICD-10-CM

## 2023-12-16 DIAGNOSIS — Z860101 Personal history of adenomatous and serrated colon polyps: Secondary | ICD-10-CM | POA: Diagnosis not present

## 2023-12-16 MED ORDER — NA SULFATE-K SULFATE-MG SULF 17.5-3.13-1.6 GM/177ML PO SOLN: ORAL | 0 refills | Status: DC

## 2023-12-16 NOTE — Patient Instructions (Signed)
 Will be scheduled for colonoscopy in the near future with Dr. Riley Cheadle. You will need to hold Jardiance  for 3 days prior to your colonoscopy. 1 day prior to your colonoscopy: Hold evening dose of glipizide . Day of your procedure: Do not take any morning diabetes medications.  We will see you back in the office after your colonoscopy as Dr. Riley Cheadle recommends.  Shana Daring, PA-C [facility] Gastroenterology

## 2024-01-03 ENCOUNTER — Telehealth: Payer: Self-pay | Admitting: Cardiovascular Disease

## 2024-01-03 NOTE — Telephone Encounter (Signed)
 Left message for patient to callback to verify he is referring to the Vascepa . Also, this is not prescribed by our providers here, he may need to request from his PCP.  Will send message to Dr. Katheryne Pane to see if OK to refill Vascepa  for patient.

## 2024-01-03 NOTE — Telephone Encounter (Signed)
 Pt c/o medication issue:  1. Name of Medication:   Fish Oil  2. How are you currently taking this medication (dosage and times per day)?   As prescribed  3. Are you having a reaction (difficulty breathing--STAT)?   No  4. What is your medication issue?     Patient stated he wants to get a refill prescription for his Fish Oil medication sent to [facility] [place] [facility] [place] [address]  Patient stated he still has some medication left.

## 2024-01-04 NOTE — Telephone Encounter (Signed)
 He already has this approved from whoever he got the Rx from, hence would recommend he contact the primary provider for Rx.  Suspect his elevated triglycerides are related to compliance with his diet, reduction in starch, fried food, improving his blood sugar will certainly help in reducing triglycerides.

## 2024-01-06 DIAGNOSIS — M48062 Spinal stenosis, lumbar region with neurogenic claudication: Secondary | ICD-10-CM | POA: Diagnosis not present

## 2024-01-06 DIAGNOSIS — Z79899 Other long term (current) drug therapy: Secondary | ICD-10-CM | POA: Diagnosis not present

## 2024-01-06 DIAGNOSIS — E1143 Type 2 diabetes mellitus with diabetic autonomic (poly)neuropathy: Secondary | ICD-10-CM | POA: Diagnosis not present

## 2024-01-06 DIAGNOSIS — E1165 Type 2 diabetes mellitus with hyperglycemia: Secondary | ICD-10-CM | POA: Diagnosis not present

## 2024-01-06 DIAGNOSIS — E782 Mixed hyperlipidemia: Secondary | ICD-10-CM | POA: Diagnosis not present

## 2024-01-06 NOTE — Telephone Encounter (Signed)
 Left message for patient to call back

## 2024-01-07 NOTE — Telephone Encounter (Signed)
 Per Dr. Berry Bristol  "He already has this approved from whoever he got the Rx from, hence would recommend he contact the primary provider for Rx.  Suspect his elevated triglycerides are related to compliance with his diet, reduction in starch, fried food, improving his blood sugar will certainly help in reducing triglycerides."   Returned call to pt- no answer, lerft detailed message, ok per DPR.

## 2024-01-29 ENCOUNTER — Other Ambulatory Visit: Payer: Self-pay

## 2024-01-29 ENCOUNTER — Encounter (HOSPITAL_COMMUNITY): Payer: Self-pay

## 2024-01-29 NOTE — Pre-Procedure Instructions (Signed)
 PAT completed over the phone with the patient.

## 2024-01-30 ENCOUNTER — Encounter (HOSPITAL_COMMUNITY)
Admission: RE | Admit: 2024-01-30 | Discharge: 2024-01-30 | Disposition: A | Discharge status: Home / Self Care | Source: Ambulatory Visit | Attending: Internal Medicine | Admitting: Internal Medicine

## 2024-02-03 ENCOUNTER — Encounter (HOSPITAL_COMMUNITY): Payer: Self-pay | Admitting: Internal Medicine

## 2024-02-03 ENCOUNTER — Other Ambulatory Visit: Payer: Self-pay

## 2024-02-03 ENCOUNTER — Encounter (HOSPITAL_COMMUNITY)
Admission: RE | Disposition: A | Discharge status: Home / Self Care | Payer: Self-pay | Source: Home / Self Care | Attending: Internal Medicine

## 2024-02-03 ENCOUNTER — Ambulatory Visit (HOSPITAL_COMMUNITY): Admitting: Certified Registered Nurse Anesthetist

## 2024-02-03 ENCOUNTER — Ambulatory Visit (HOSPITAL_COMMUNITY)
Admission: RE | Admit: 2024-02-03 | Discharge: 2024-02-03 | Disposition: A | Discharge status: Home / Self Care | Attending: Internal Medicine | Admitting: Internal Medicine

## 2024-02-03 ENCOUNTER — Ambulatory Visit (HOSPITAL_BASED_OUTPATIENT_CLINIC_OR_DEPARTMENT_OTHER): Admitting: Certified Registered Nurse Anesthetist

## 2024-02-03 DIAGNOSIS — E1151 Type 2 diabetes mellitus with diabetic peripheral angiopathy without gangrene: Secondary | ICD-10-CM | POA: Diagnosis not present

## 2024-02-03 DIAGNOSIS — K5730 Diverticulosis of large intestine without perforation or abscess without bleeding: Secondary | ICD-10-CM | POA: Insufficient documentation

## 2024-02-03 DIAGNOSIS — M1990 Unspecified osteoarthritis, unspecified site: Secondary | ICD-10-CM | POA: Diagnosis not present

## 2024-02-03 DIAGNOSIS — I2510 Atherosclerotic heart disease of native coronary artery without angina pectoris: Secondary | ICD-10-CM | POA: Insufficient documentation

## 2024-02-03 DIAGNOSIS — J449 Chronic obstructive pulmonary disease, unspecified: Secondary | ICD-10-CM | POA: Insufficient documentation

## 2024-02-03 DIAGNOSIS — K219 Gastro-esophageal reflux disease without esophagitis: Secondary | ICD-10-CM | POA: Insufficient documentation

## 2024-02-03 DIAGNOSIS — Z7902 Long term (current) use of antithrombotics/antiplatelets: Secondary | ICD-10-CM | POA: Insufficient documentation

## 2024-02-03 DIAGNOSIS — Z7984 Long term (current) use of oral hypoglycemic drugs: Secondary | ICD-10-CM | POA: Insufficient documentation

## 2024-02-03 DIAGNOSIS — Z860101 Personal history of adenomatous and serrated colon polyps: Secondary | ICD-10-CM | POA: Diagnosis present

## 2024-02-03 DIAGNOSIS — Z1211 Encounter for screening for malignant neoplasm of colon: Secondary | ICD-10-CM | POA: Insufficient documentation

## 2024-02-03 DIAGNOSIS — K449 Diaphragmatic hernia without obstruction or gangrene: Secondary | ICD-10-CM | POA: Diagnosis not present

## 2024-02-03 DIAGNOSIS — K644 Residual hemorrhoidal skin tags: Secondary | ICD-10-CM | POA: Diagnosis not present

## 2024-02-03 DIAGNOSIS — Z860100 Personal history of colon polyps, unspecified: Secondary | ICD-10-CM | POA: Diagnosis not present

## 2024-02-03 DIAGNOSIS — K649 Unspecified hemorrhoids: Secondary | ICD-10-CM | POA: Insufficient documentation

## 2024-02-03 DIAGNOSIS — I10 Essential (primary) hypertension: Secondary | ICD-10-CM | POA: Insufficient documentation

## 2024-02-03 DIAGNOSIS — F17210 Nicotine dependence, cigarettes, uncomplicated: Secondary | ICD-10-CM | POA: Diagnosis not present

## 2024-02-03 DIAGNOSIS — F17200 Nicotine dependence, unspecified, uncomplicated: Secondary | ICD-10-CM

## 2024-02-03 HISTORY — PX: COLONOSCOPY: SHX5424

## 2024-02-03 LAB — GLUCOSE, CAPILLARY: Glucose-Capillary: 161 mg/dL — ABNORMAL HIGH (ref 70–99)

## 2024-02-03 SURGERY — COLONOSCOPY
Anesthesia: General

## 2024-02-03 MED ORDER — LIDOCAINE HCL (PF) 2 % IJ SOLN
INTRAMUSCULAR | Status: DC | PRN
Start: 2024-02-03 — End: 2024-02-03
  Administered 2024-02-03: 60 mg via INTRADERMAL

## 2024-02-03 MED ORDER — LACTATED RINGERS IV SOLN: INTRAVENOUS | Status: DC | PRN

## 2024-02-03 MED ORDER — PROPOFOL 500 MG/50ML IV EMUL
INTRAVENOUS | Status: DC | PRN
Start: 2024-02-03 — End: 2024-02-03
  Administered 2024-02-03: 150 ug/kg/min via INTRAVENOUS

## 2024-02-03 MED ORDER — PROPOFOL 10 MG/ML IV BOLUS
INTRAVENOUS | Status: DC | PRN
  Administered 2024-02-03: 40 mg via INTRAVENOUS
  Administered 2024-02-03: 80 mg via INTRAVENOUS

## 2024-02-03 NOTE — Op Note (Signed)
 [facility] Patient Name: [name] Procedure Date: 02/03/2024 7:58 AM MRN: [id number] Date of Birth: 03-21-1961 Attending MD: Lamar Ozell Hollingshead , MD, [id number] CSN: [id number] Age: 63 Admit Type: Outpatient Procedure:                Colonoscopy Indications:              High risk colon cancer surveillance: Personal                            history of colonic polyps Providers:                Lamar Ozell Hollingshead, MD, Jon LABOR. Gerome RN, RN,                            Italy Wilson, Technician Referring MD:              Medicines:                Propofol  per Anesthesia Complications:            No immediate complications. Estimated Blood Loss:     Estimated blood loss: none. Procedure:                Pre-Anesthesia Assessment:                           - Prior to the procedure, a History and Physical                            was performed, and patient medications and                            allergies were reviewed. The patient's tolerance of                            previous anesthesia was also reviewed. The risks                            and benefits of the procedure and the sedation                            options and risks were discussed with the patient.                            All questions were answered, and informed consent                            was obtained. Prior Anticoagulants: The patient                            last took Plavix  (clopidogrel ) 1 day prior to the                            procedure. ASA Grade Assessment: III - A patient  with severe systemic disease. After reviewing the                            risks and benefits, the patient was deemed in                            satisfactory condition to undergo the procedure.                           After obtaining informed consent, the colonoscope                            was passed under direct vision. Throughout the                             procedure, the patient's blood pressure, pulse, and                            oxygen saturations were monitored continuously. The                            [id number]) scope was introduced through the                            anus and advanced to the the cecum, identified by                            appendiceal orifice and ileocecal valve. The                            colonoscopy was performed without difficulty. The                            patient tolerated the procedure well. The quality                            of the bowel preparation was adequate. The                            ileocecal valve, appendiceal orifice, and rectum                            were photographed. The colonoscopy was performed                            without difficulty. The patient tolerated the                            procedure well. The quality of the bowel                            preparation was adequate. Scope In: 8:18:24 AM Scope Out: 8:29:42 AM Scope Withdrawal Time: 0 hours 7 minutes 41 seconds  Total Procedure Duration: 0 hours 11 minutes  18 seconds  Findings:      The perianal and digital rectal examinations were normal. some stool       residue throughout the colon after washing and lavage took place prep       became adequate for polyp detection.      Scattered diverticula were found in the entire colon. Estimated blood       loss: none.      The exam was otherwise without abnormality. Rectum too small to       retroflex. Seen well on face. Impression:               - Diverticulosis in the entire examined colon. Anal                            canal hemorrhoids                           - The examination was otherwise normal.                           - No specimens collected. Patient would do better                            with a large volume prep next time. Also patient                            and spouse noted me patient was having significant                             trouble with postprandial cramps and diarrhea. Moderate Sedation:      Moderate (conscious) sedation was personally administered by an       anesthesia professional. The following parameters were monitored: oxygen       saturation, heart rate, blood pressure, respiratory rate, EKG, adequacy       of pulmonary ventilation, and response to care. Recommendation:           - Patient has a contact number available for                            emergencies. The signs and symptoms of potential                            delayed complications were discussed with the                            patient. Return to normal activities tomorrow.                            Written discharge instructions were provided to the                            patient.                           - Advance diet as tolerated.                           -  Continue present medications.                           - Repeat colonoscopy in 7 years for surveillance.                           - Return to GI office in 1 month. Office visit in 4                            weeks to evaluate New bowel complaints. Procedure Code(s):        --- Professional ---                           [id number], Colonoscopy, flexible; diagnostic, including                            collection of specimen(s) by brushing or washing,                            when performed (separate procedure) Diagnosis Code(s):        --- Professional ---                           Z86.010, Personal history of colonic polyps                           K57.30, Diverticulosis of large intestine without                            perforation or abscess without bleeding CPT copyright 2022 American [employer] Association. All rights reserved. The codes documented in this report are preliminary and upon coder review may  be revised to meet current compliance requirements. Lamar HERO. Elnora Quizon, MD Lamar Ozell Hollingshead, MD 02/03/2024 8:47:04 AM This report has been signed  electronically. Number of Addenda: 0

## 2024-02-03 NOTE — Transfer of Care (Signed)
 Immediate Anesthesia Transfer of Care Note  Patient: [name]  Procedure(s) Performed: COLONOSCOPY  Patient Location: Endoscopy Unit  Anesthesia Type:General  Level of Consciousness: awake, alert , and oriented  Airway & Oxygen Therapy: Patient Spontanous Breathing  Post-op Assessment: Report given to RN and Post -op Vital signs reviewed and stable  Post vital signs: Reviewed and stable  Last Vitals:  Vitals Value Taken Time  BP 110/60   Temp 98   Pulse 65   Resp 16   SpO2 96     Last Pain:  Vitals:   02/03/24 0812  TempSrc:   PainSc: 0-No pain         Complications: No notable events documented.

## 2024-02-03 NOTE — H&P (Signed)
 @LOGO @   Primary Care Physician:  Suanne Pfeiffer, NP Primary Gastroenterologist:  Dr. Shaaron  Pre-Procedure History & Physical: HPI:  [name] is a 63 y.o. male here for here for surveillance colonoscopy.  Distant history of colonic adenoma removed 2013 hyperplastic polyp 2018 here for surveillance examination.  He remains on Plavix  per plan.  Past Medical History:  Diagnosis Date   Arthritis    some; in hips sometimes (01/02/2013)   Back pain    w/prolonged walking or standing (01/02/2013)   CAD (coronary artery disease)    CABG X 4 08/2012   COPD (chronic obstructive pulmonary disease) (HCC)    Elevated PSA    Esophageal erosions 05/24/2001   egd by Dr. Shaaron   GERD (gastroesophageal reflux disease)    H/O hiatal hernia    Headache    History of gout    Hyperlipidemia    Hypertension    not on any medication for htn at present   PAD (peripheral artery disease) (HCC)    LEA DOPPLER, 11/11/2012 - moderate arterial insufficiency to lower extremities at rest, BILATERAL SFA-demonstrates occlusive disease with reconstitution of flow   S/P percutaneous transluminal angioplasty (PTA) with stent placement 03/29/21 03/31/2021   Tobacco abuse    Tubular adenoma of colon 08/28/2011   Type II diabetes mellitus (HCC)    Unintentional weight loss     Past Surgical History:  Procedure Laterality Date   ABDOMINAL AORTOGRAM W/LOWER EXTREMITY N/A 03/30/2021   Procedure: ABDOMINAL AORTOGRAM W/LOWER EXTREMITY;  Surgeon: Court Dorn PARAS, MD;  [facility];  Service: Cardiovascular;  Laterality: N/A;   ABDOMINAL AORTOGRAM W/LOWER EXTREMITY N/A 04/12/2022   Procedure: ABDOMINAL AORTOGRAM W/LOWER EXTREMITY;  Surgeon: Court Dorn PARAS, MD;  [facility]: [facility];  Service: Cardiovascular;  Laterality: N/A;   ABDOMINAL AORTOGRAM W/LOWER EXTREMITY N/A 04/01/2023   Procedure: ABDOMINAL AORTOGRAM W/LOWER EXTREMITY;  Surgeon: Court Dorn PARAS, MD;  [facility];  Service: Cardiovascular;  Laterality: N/A;   ANTERIOR CERVICAL DECOMP/DISCECTOMY FUSION  12/19/2011   Procedure: ANTERIOR CERVICAL DECOMPRESSION/DISCECTOMY FUSION 2 LEVELS;  Surgeon: Catalina CHRISTELLA Stains, MD;  Location: [facility];  Service: Neurosurgery;  Laterality: N/A;  Cervical four-five Cervical five-six  Anterior cervical decompression/diskectomy, fusion, plate   ATHERECTOMY N/A 01/02/2013   Procedure: ATHERECTOMY;  Surgeon: Dorn PARAS Court, MD;  Location: [facility] CATH LAB;  Service: Cardiovascular;  Laterality: N/A;   BACK SURGERY     x4   CARDIAC CATHETERIZATION  08/19/2012   CABG warranted; Severe ostial LCx stenosis with mid to distal vessel occlusion   COLONOSCOPY W/ POLYPECTOMY  08/28/2011   Alayla Dethlefs-tubular adenomas removed from ascending colon, suboptimal prep, diminutive rectal polyps   COLONOSCOPY WITH PROPOFOL  N/A 06/13/2017   Procedure: COLONOSCOPY WITH PROPOFOL ;  Surgeon: Shaaron Lamar CHRISTELLA, MD;  [facility]: [facility];  Service: Endoscopy;  Laterality: N/A;  9:15am   CORONARY ARTERY BYPASS GRAFT  08/22/2012   Procedure: CORONARY ARTERY BYPASS GRAFTING (CABG);  Surgeon: Dallas KATHEE Jude, MD;  Location: [facility] OR;  Service: Open Heart Surgery;  Laterality: N/A;  CABG x four,  using left internal mammary artery and right leg greater saphenous vein harvested endoscopically   ENDARTERECTOMY FEMORAL Right 08/05/2023   Procedure: ENDARTERECTOMY FEMORAL with Profundoplasty;  Surgeon: Gretta Lonni PARAS, MD;  Location: [facility] OR;  Service: Vascular;  Laterality: Right;   ESOPHAGOGASTRODUODENOSCOPY  05/24/2001   by Dr. Shaaron   ESOPHAGOGASTRODUODENOSCOPY  08/28/2011   Jenafer Winterton-erosive reflux esophagitis, gastric erosions  ESOPHAGOGASTRODUODENOSCOPY (EGD) WITH PROPOFOL  N/A 06/13/2017   Procedure: ESOPHAGOGASTRODUODENOSCOPY (EGD) WITH PROPOFOL ;  Surgeon: Shaaron Lamar HERO, MD;  [facility]: [facility];  Service: Endoscopy;  Laterality: N/A;   INSERTION OF ILIAC STENT Right 08/05/2023    Procedure: Insertion of Iliac Stent with Iliac Arteriogram;  Surgeon: Gretta Lonni PARAS, MD;  Location: [facility] OR;  Service: Vascular;  Laterality: Right;   INTRAOPERATIVE TRANSESOPHAGEAL ECHOCARDIOGRAM  08/22/2012   Procedure: INTRAOPERATIVE TRANSESOPHAGEAL ECHOCARDIOGRAM;  Surgeon: Dallas KATHEE Jude, MD;  Location: [facility] OR;  Service: Open Heart Surgery;  Laterality: N/A;   IR 3D INDEPENDENT WKST  05/17/2022   IR ANGIO INTRA EXTRACRAN SEL INTERNAL CAROTID UNI R MOD SED  05/17/2022   IR ANGIO INTRA EXTRACRAN SEL INTERNAL CAROTID UNI R MOD SED  12/04/2022   IR ANGIOGRAM FOLLOW UP STUDY  05/17/2022   IR NEURO EACH ADD'L AFTER BASIC UNI RIGHT (MS)  05/17/2022   IR TRANSCATH/EMBOLIZ  05/17/2022   IR US  GUIDE VASC ACCESS RIGHT  05/17/2022   IR US  GUIDE VASC ACCESS RIGHT  12/04/2022   LEFT HEART CATHETERIZATION WITH CORONARY ANGIOGRAM N/A 08/19/2012   Procedure: LEFT HEART CATHETERIZATION WITH CORONARY ANGIOGRAM;  Surgeon: Vinie KYM Maxcy, MD;  [facility]: [facility];  Service: Cardiovascular;  Laterality: N/A;   LOWER EXTREMITY ANGIOGRAM N/A 11/17/2012   Procedure: LOWER EXTREMITY ANGIOGRAM;  Surgeon: Dorn PARAS Lesches, MD;  Location: [facility] CATH LAB;  Service: Cardiovascular;  Laterality: N/A;   LOWER EXTREMITY ANGIOGRAM N/A 02/02/2013   Procedure: LOWER EXTREMITY ANGIOGRAM;  Surgeon: Dorn PARAS Lesches, MD;  Location: [facility] CATH LAB;  Service: Cardiovascular;  Laterality: N/A;   LUMBAR DISC SURGERY     LUMBAR FUSION  ~ 2011   PATCH ANGIOPLASTY Right 08/05/2023   Procedure: PATCH ANGIOPLASTY;  Surgeon: Gretta Lonni PARAS, MD;  Location: [facility] OR;  Service: Vascular;  Laterality: Right;   PELVIC ABCESS DRAINAGE     x2   PERIPHERAL VASCULAR ATHERECTOMY  03/30/2021   Procedure: PERIPHERAL VASCULAR ATHERECTOMY;  Surgeon: Lesches Dorn PARAS, MD;  [facility];  Service: Cardiovascular;;  left common iliac artery   PERIPHERAL VASCULAR ATHERECTOMY  04/01/2023   Procedure: PERIPHERAL VASCULAR  ATHERECTOMY;  Surgeon: Lesches Dorn PARAS, MD;  Location: [facility] INVASIVE CV LAB;  Service: Cardiovascular;;   PERIPHERAL VASCULAR BALLOON ANGIOPLASTY Left 04/12/2022   Procedure: PERIPHERAL VASCULAR BALLOON ANGIOPLASTY;  Surgeon: Lesches Dorn PARAS, MD;  [facility];  Service: Cardiovascular;  Laterality: Left;  SFA   PERIPHERAL VASCULAR BALLOON ANGIOPLASTY Right 04/01/2023   Procedure: PERIPHERAL VASCULAR BALLOON ANGIOPLASTY;  Surgeon: Lesches Dorn PARAS, MD;  [facility];  Service: Cardiovascular;  Laterality: Right;  DCB - SFA   PERIPHERAL VASCULAR INTERVENTION  03/30/2021   Procedure: PERIPHERAL VASCULAR INTERVENTION;  Surgeon: Lesches Dorn PARAS, MD;  [facility];  Service: Cardiovascular;;   POLYPECTOMY  06/13/2017   Procedure: POLYPECTOMY;  Surgeon: Shaaron Lamar HERO, MD;  [facility];  Service: Endoscopy;;  colon   RADIOLOGY WITH ANESTHESIA N/A 05/17/2022   Procedure: Diagnostic Cerebral Angiogram, Possible Aneurysm Coiling, Possible Stent;  Surgeon: Lanis Pupa, MD;  Location: [facility] OR;  Service: Radiology;  Laterality: N/A;   SHOULDER SURGERY Right    TOTAL HIP ARTHROPLASTY Right 12/25/2022   Procedure: TOTAL HIP ARTHROPLASTY;  Surgeon: Josefina Chew, MD;  Location: [facility] ORS;  Service: Orthopedics;  Laterality: Right;   TOTAL HIP ARTHROPLASTY Right    VASCULAR SURGERY Right 01/02/2013   diamondback orbital  rotational  atherectomy, chocolate balloon and IDEV  Stent.    Prior to Admission medications   Medication Sig Start Date End Date Taking? Authorizing Provider  albuterol  (VENTOLIN  HFA) 108 (90 Base) MCG/ACT inhaler Inhale 2 puffs into the lungs every 6 (six) hours as needed for wheezing or shortness of breath.   Yes [provider]  Cholecalciferol (VITAMIN D-3 PO) Take 1 capsule by mouth daily.   Yes [provider]  glipiZIDE  (GLUCOTROL  XL) 10 MG 24 hr tablet Take 10 mg by mouth 2 (two) times daily.   Yes  [provider]  icosapent  Ethyl (VASCEPA ) 1 g capsule Take 2 g by mouth 2 (two) times daily.   Yes [provider]  losartan  (COZAAR ) 50 MG tablet Take 50 mg by mouth daily. 04/27/22  Yes [provider]  oxyCODONE -acetaminophen  (PERCOCET) 10-325 MG tablet Take 1 tablet by mouth 4 (four) times daily as needed for pain. 02/20/21  Yes [provider]  rosuvastatin  (CRESTOR ) 40 MG tablet Take 1 tablet (40 mg total) by mouth daily. 09/10/18  Yes Tat, Alm, MD  thiamine  (VITAMIN B-1) 100 MG tablet Take 1 tablet (100 mg total) by mouth daily. 02/12/23  Yes Ricky Fines, MD  zolpidem  (AMBIEN  CR) 12.5 MG CR tablet Take 12.5 mg by mouth at bedtime. 06/15/20  Yes [provider]  acetaminophen  (TYLENOL ) 650 MG CR tablet Take 1,300 mg by mouth daily as needed for pain.    [provider]  aspirin  EC 81 MG EC tablet Take 1 tablet (81 mg total) by mouth daily. 09/11/18   Evonnie Alm, MD  clopidogrel  (PLAVIX ) 75 MG tablet Take 1 tablet (75 mg total) by mouth daily with breakfast. 05/03/15   Hilty, Vinie BROCKS, MD  empagliflozin  (JARDIANCE ) 25 MG TABS tablet Take 25 mg by mouth daily.    [provider]  gabapentin  (NEURONTIN ) 800 MG tablet Take 800 mg by mouth 3 (three) times daily. 05/29/23   [provider]  isosorbide  mononitrate (IMDUR ) 60 MG 24 hr tablet Take 1 tablet (60 mg total) by mouth daily. 02/12/23   Ricky Fines, MD  metoprolol  succinate (TOPROL -XL) 25 MG 24 hr tablet TAKE 1 TABLET BY MOUTH ONCE A DAY. 05/07/23   Mona Vinie BROCKS, MD  Na Sulfate-K Sulfate-Mg Sulfate concentrate (SUPREP) 17.5-3.13-1.6 GM/177ML SOLN As directed 12/16/23   Morganne Haile, Lamar HERO, MD  ondansetron  (ZOFRAN ) 4 MG tablet Take 1 tablet (4 mg total) by mouth every 8 (eight) hours as needed for nausea or vomiting. 12/25/22   Delores Army POUR, PA-C    Allergies as of 12/16/2023   (No Known Allergies)    Family History  Problem Relation Age of Onset   Anesthesia  problems Neg Hx    Colon cancer Neg Hx    Gastric cancer Neg Hx    Esophageal cancer Neg Hx    Colon polyps Neg Hx     Social History   Socioeconomic History   Marital status: Single    Spouse name: Not on file   Number of children: 1   Years of education: Not on file   Highest education level: Not on file  Occupational History   Occupation: disabled    Employer: DISABLED  Tobacco Use   Smoking status: Every Day    Current packs/day: 1.50    Average packs/day: 1.5 packs/day for 39.0 years (58.5 ttl pk-yrs)    Types: Cigarettes   Smokeless tobacco: Never  Vaping Use   Vaping status: Never Used  Substance and Sexual Activity   Alcohol use: Yes    Alcohol/week: 12.0 standard drinks of alcohol    Types: 12 Cans of beer per week    Comment: weekly   Drug use: No   Sexual activity: Not Currently  Other Topics Concern   Not on file  Social History Narrative   Lives w/ girlfriend      Right handed   Wears glasses    Drinks sweet tea (rare)   Drinks diet mountain dew    Social Drivers of Health   Financial Resource Strain: Low Risk  (09/20/2023)   Received from [facility]   Overall Financial Resource Strain (CARDIA)    Difficulty of Paying Living Expenses: Not hard at all  Food Insecurity: No Food Insecurity (09/20/2023)   Received from [facility]   Hunger Vital Sign    Within the past 12 months, you worried that your food would run out before you got the money to buy more.: Never true    Within the past 12 months, the food you bought just didn't last and you didn't have money to get more.: Never true  Transportation Needs: No Transportation Needs (09/20/2023)   Received from [facility] - Transportation    Lack of Transportation (Medical): No    Lack of Transportation (Non-Medical): No  Physical Activity: Unknown (03/20/2023)   Received from [facility]   Exercise Vital Sign    On average, how many days per week do you engage in moderate to  strenuous exercise (like a brisk walk)?: 0 days    Minutes of Exercise per Session: Not on file  Stress: No Stress Concern Present (03/20/2023)   Received from [facility] - Occupational Stress Questionnaire    Feeling of Stress : Not at all  Social Connections: Moderately Integrated (03/20/2023)   Received from [facility]   Social Network    How would you rate your social network (family, work, friends)?: Adequate participation with social networks  Intimate Partner Violence: Not At Risk (08/05/2023)   Humiliation, Afraid, Rape, and Kick questionnaire    Fear of Current or Ex-Partner: No    Emotionally Abused: No    Physically Abused: No    Sexually Abused: No    Review of Systems: See HPI, otherwise negative ROS  Physical Exam: BP (!) 145/70   Pulse 71   Temp 98.1 F (36.7 C) (Oral)   Resp 16   Ht 5' 11 (1.803 m)   Wt 85.7 kg   SpO2 92%   BMI 26.36 kg/m  General:   Alert,  Well-developed, well-nourished, pleasant and cooperative in NAD Neck:  Supple; no masses or thyromegaly. No significant cervical adenopathy. Lungs:  Clear throughout to auscultation.   No wheezes, crackles, or rhonchi. No acute distress. Heart:  Regular rate and rhythm; no murmurs, clicks, rubs,  or gallops. Abdomen: Non-distended, normal bowel sounds.  Soft and nontender without appreciable mass or hepatosplenomegaly.  Pulses:  Normal pulses noted. Extremities:  Without clubbing or edema.  Impression/Plan: 62 year old gentleman with a distant history of colonic adenoma here for surveillance colonoscopy.  The risks, benefits, limitations, alternatives and imponderables have been reviewed with the patient. Questions have been answered. All parties are agreeable.       Notice: This dictation was prepared with Dragon dictation along with smaller phrase technology. Any transcriptional errors that result from this process are unintentional and may not be  corrected upon review.

## 2024-02-03 NOTE — Anesthesia Preprocedure Evaluation (Addendum)
 Anesthesia Evaluation  Patient identified by MRN, date of birth, ID band Patient awake    Reviewed: Allergy & Precautions, H&P , NPO status , Patient's Chart, lab work & pertinent test results  Airway Mallampati: II  TM Distance: >3 FB Neck ROM: Full    Dental no notable dental hx.    Pulmonary pneumonia, COPD, Current Smoker   Pulmonary exam normal breath sounds clear to auscultation       Cardiovascular hypertension, + CAD and + Peripheral Vascular Disease  Normal cardiovascular exam Rhythm:Regular Rate:Normal     Neuro/Psych  Headaches  negative psych ROS   GI/Hepatic Neg liver ROS, hiatal hernia,GERD  ,,  Endo/Other  diabetes    Renal/GU negative Renal ROS  negative genitourinary   Musculoskeletal  (+) Arthritis ,    Abdominal   Peds negative pediatric ROS (+)  Hematology negative hematology ROS (+)   Anesthesia Other Findings   Reproductive/Obstetrics negative OB ROS                             Anesthesia Physical Anesthesia Plan  ASA: 3  Anesthesia Plan: General   Post-op Pain Management:    Induction: Intravenous  PONV Risk Score and Plan: Propofol  infusion  Airway Management Planned: Nasal Cannula  Additional Equipment:   Intra-op Plan:   Post-operative Plan:   Informed Consent: I have reviewed the patients History and Physical, chart, labs and discussed the procedure including the risks, benefits and alternatives for the proposed anesthesia with the patient or authorized representative who has indicated his/her understanding and acceptance.     Dental advisory given  Plan Discussed with: CRNA  Anesthesia Plan Comments:        Anesthesia Quick Evaluation

## 2024-02-03 NOTE — Anesthesia Postprocedure Evaluation (Signed)
 Anesthesia Post Note  Patient: [name]  Procedure(s) Performed: COLONOSCOPY  Patient location during evaluation: PACU Anesthesia Type: General Level of consciousness: awake and alert Pain management: pain level controlled Vital Signs Assessment: post-procedure vital signs reviewed and stable Respiratory status: spontaneous breathing, nonlabored ventilation, respiratory function stable and patient connected to nasal cannula oxygen Cardiovascular status: stable and blood pressure returned to baseline Postop Assessment: no apparent nausea or vomiting Anesthetic complications: no  No notable events documented.   Last Vitals:  Vitals:   02/03/24 0832 02/03/24 0835  BP: (!) 103/58 116/80  Pulse: 70   Resp: 19   Temp: 36.5 C   SpO2: 93% 97%    Last Pain:  Vitals:   02/03/24 0832  TempSrc: Oral  PainSc: 0-No pain                 Andrea Limes

## 2024-02-03 NOTE — Discharge Instructions (Addendum)
  Colonoscopy Discharge Instructions  Read the instructions outlined below and refer to this sheet in the next few weeks. These discharge instructions provide you with general information on caring for yourself after you leave the hospital. Your doctor may also give you specific instructions. While your treatment has been planned according to the most current medical practices available, unavoidable complications occasionally occur. If you have any problems or questions after discharge, call Dr. Shaaron at [phone]. ACTIVITY You may resume your regular activity, but move at a slower pace for the next 24 hours.  Take frequent rest periods for the next 24 hours.  Walking will help get rid of the air and reduce the bloated feeling in your belly (abdomen).  No driving for 24 hours (because of the medicine (anesthesia) used during the test).   Do not sign any important legal documents or operate any machinery for 24 hours (because of the anesthesia used during the test).  NUTRITION Drink plenty of fluids.  You may resume your normal diet as instructed by your doctor.  Begin with a light meal and progress to your normal diet. Heavy or fried foods are harder to digest and may make you feel sick to your stomach (nauseated).  Avoid alcoholic beverages for 24 hours or as instructed.  MEDICATIONS You may resume your normal medications unless your doctor tells you otherwise.  WHAT YOU CAN EXPECT TODAY Some feelings of bloating in the abdomen.  Passage of more gas than usual.  Spotting of blood in your stool or on the toilet paper.  IF YOU HAD POLYPS REMOVED DURING THE COLONOSCOPY: No aspirin  products for 7 days or as instructed.  No alcohol for 7 days or as instructed.  Eat a soft diet for the next 24 hours.  FINDING OUT THE RESULTS OF YOUR TEST Not all test results are available during your visit. If your test results are not back during the visit, make an appointment with your caregiver to find out the  results. Do not assume everything is normal if you have not heard from your caregiver or the medical facility. It is important for you to follow up on all of your test results.  SEEK IMMEDIATE MEDICAL ATTENTION IF: You have more than a spotting of blood in your stool.  Your belly is swollen (abdominal distention).  You are nauseated or vomiting.  You have a temperature over 101.  You have abdominal pain or discomfort that is severe or gets worse throughout the day.      Diverticulosis only found today  No polyps  Recommend a repeat colonoscopy in 7 years  At patient request, I called Reena Daring at [phone] with the findings recommendations  Office visit with [facility] in 4 to assess bowel issues.

## 2024-02-04 ENCOUNTER — Encounter (HOSPITAL_COMMUNITY): Payer: Self-pay | Admitting: Internal Medicine

## 2024-03-24 ENCOUNTER — Ambulatory Visit (INDEPENDENT_AMBULATORY_CARE_PROVIDER_SITE_OTHER): Admitting: Internal Medicine

## 2024-03-24 ENCOUNTER — Encounter: Payer: Self-pay | Admitting: Internal Medicine

## 2024-03-24 VITALS — BP 150/87 | HR 64 | Temp 98.4°F | Ht 71.0 in | Wt 197.6 lb

## 2024-03-24 DIAGNOSIS — R109 Unspecified abdominal pain: Secondary | ICD-10-CM | POA: Diagnosis not present

## 2024-03-24 DIAGNOSIS — F1090 Alcohol use, unspecified, uncomplicated: Secondary | ICD-10-CM | POA: Diagnosis not present

## 2024-03-24 DIAGNOSIS — R197 Diarrhea, unspecified: Secondary | ICD-10-CM | POA: Diagnosis not present

## 2024-03-24 MED ORDER — HYOSCYAMINE SULFATE 0.125 MG SL SUBL: 0.1250 mg | SUBLINGUAL_TABLET | Freq: Three times a day (TID) | SUBLINGUAL | 3 refills | Status: DC

## 2024-03-24 NOTE — Progress Notes (Unsigned)
 Primary Care Physician:  Suanne Pfeiffer, NP Primary Gastroenterologist:  Dr. Shaaron  Pre-Procedure History & Physical: HPI:  [name] is a 63 y.o. male here for further evaluation of a near year history of intermittent postprandial abdominal cramps bloating and nonbloody diarrhea.  Just had a surveillance colonoscopy which revealed only pancolonic diverticulosis; due for surveillance in 7 years given history of colonic polyps.  Chronic postprandial abdominal cramps and diarrhea.  Takes an Imodium on occasion.  If he does not eat he does not have symptoms.  Symptoms or not after every bowel movement.  But they occur often.  He does admit to drinking at least a case of beer weekly hemoglobin A1c is a bit high.  He does have neuropathy.  Denies any GERD symptoms.  No dysphagia no nausea or vomiting.  Essentially negative EGD previously.  Past Medical History:  Diagnosis Date   Arthritis    some; in hips sometimes (01/02/2013)   Back pain    w/prolonged walking or standing (01/02/2013)   CAD (coronary artery disease)    CABG X 4 08/2012   COPD (chronic obstructive pulmonary disease) (HCC)    Elevated PSA    Esophageal erosions 05/24/2001   egd by Dr. Shaaron   GERD (gastroesophageal reflux disease)    H/O hiatal hernia    Headache    History of gout    Hyperlipidemia    Hypertension    not on any medication for htn at present   PAD (peripheral artery disease) (HCC)    LEA DOPPLER, 11/11/2012 - moderate arterial insufficiency to lower extremities at rest, BILATERAL SFA-demonstrates occlusive disease with reconstitution of flow   S/P percutaneous transluminal angioplasty (PTA) with stent placement 03/29/21 03/31/2021   Tobacco abuse    Tubular adenoma of colon 08/28/2011   Type II diabetes mellitus (HCC)    Unintentional weight loss     Past Surgical History:  Procedure Laterality Date   ABDOMINAL AORTOGRAM W/LOWER EXTREMITY N/A 03/30/2021   Procedure: ABDOMINAL AORTOGRAM  W/LOWER EXTREMITY;  Surgeon: Court Dorn PARAS, MD;  [facility]: [facility];  Service: Cardiovascular;  Laterality: N/A;   ABDOMINAL AORTOGRAM W/LOWER EXTREMITY N/A 04/12/2022   Procedure: ABDOMINAL AORTOGRAM W/LOWER EXTREMITY;  Surgeon: Court Dorn PARAS, MD;  Location: [facility];  Service: Cardiovascular;  Laterality: N/A;   ABDOMINAL AORTOGRAM W/LOWER EXTREMITY N/A 04/01/2023   Procedure: ABDOMINAL AORTOGRAM W/LOWER EXTREMITY;  Surgeon: Court Dorn PARAS, MD;  Location: [facility];  Service: Cardiovascular;  Laterality: N/A;   ANTERIOR CERVICAL DECOMP/DISCECTOMY FUSION  12/19/2011   Procedure: ANTERIOR CERVICAL DECOMPRESSION/DISCECTOMY FUSION 2 LEVELS;  Surgeon: Catalina CHRISTELLA Stains, MD;  Location: [facility];  Service: Neurosurgery;  Laterality: N/A;  Cervical four-five Cervical five-six  Anterior cervical decompression/diskectomy, fusion, plate   ATHERECTOMY N/A 01/02/2013   Procedure: ATHERECTOMY;  Surgeon: Dorn PARAS Court, MD;  Location: [facility] CATH LAB;  Service: Cardiovascular;  Laterality: N/A;   BACK SURGERY     x4   CARDIAC CATHETERIZATION  08/19/2012   CABG warranted; Severe ostial LCx stenosis with mid to distal vessel occlusion   COLONOSCOPY N/A 02/03/2024   Procedure: COLONOSCOPY;  Surgeon: Shaaron Lamar CHRISTELLA, MD;  Location: [facility];  Service: Endoscopy;  Laterality: N/A;  8:15 am, asa 3   COLONOSCOPY W/ POLYPECTOMY  08/28/2011   Aydan Phoenix-tubular adenomas removed from ascending colon, suboptimal prep, diminutive rectal polyps   COLONOSCOPY WITH PROPOFOL  N/A 06/13/2017   Procedure: COLONOSCOPY WITH PROPOFOL ;  Surgeon:  Jadriel Saxer, Lamar HERO, MD;  Location: [facility];  Service: Endoscopy;  Laterality: N/A;  9:15am   CORONARY ARTERY BYPASS GRAFT  08/22/2012   Procedure: CORONARY ARTERY BYPASS GRAFTING (CABG);  Surgeon: Dallas KATHEE Jude, MD;  Location: [facility] OR;  Service: Open Heart Surgery;  Laterality: N/A;  CABG x four,  using left internal mammary artery and right leg  greater saphenous vein harvested endoscopically   ENDARTERECTOMY FEMORAL Right 08/05/2023   Procedure: ENDARTERECTOMY FEMORAL with Profundoplasty;  Surgeon: Gretta Lonni PARAS, MD;  Location: [facility] OR;  Service: Vascular;  Laterality: Right;   ESOPHAGOGASTRODUODENOSCOPY  05/24/2001   by Dr. Shaaron   ESOPHAGOGASTRODUODENOSCOPY  08/28/2011   Pattricia Weiher-erosive reflux esophagitis, gastric erosions   ESOPHAGOGASTRODUODENOSCOPY (EGD) WITH PROPOFOL  N/A 06/13/2017   Procedure: ESOPHAGOGASTRODUODENOSCOPY (EGD) WITH PROPOFOL ;  Surgeon: Shaaron Lamar HERO, MD;  [facility];  Service: Endoscopy;  Laterality: N/A;   INSERTION OF ILIAC STENT Right 08/05/2023   Procedure: Insertion of Iliac Stent with Iliac Arteriogram;  Surgeon: Gretta Lonni PARAS, MD;  Location: [facility] OR;  Service: Vascular;  Laterality: Right;   INTRAOPERATIVE TRANSESOPHAGEAL ECHOCARDIOGRAM  08/22/2012   Procedure: INTRAOPERATIVE TRANSESOPHAGEAL ECHOCARDIOGRAM;  Surgeon: Dallas KATHEE Jude, MD;  Location: [facility] OR;  Service: Open Heart Surgery;  Laterality: N/A;   IR 3D INDEPENDENT WKST  05/17/2022   IR ANGIO INTRA EXTRACRAN SEL INTERNAL CAROTID UNI R MOD SED  05/17/2022   IR ANGIO INTRA EXTRACRAN SEL INTERNAL CAROTID UNI R MOD SED  12/04/2022   IR ANGIOGRAM FOLLOW UP STUDY  05/17/2022   IR NEURO EACH ADD'L AFTER BASIC UNI RIGHT (MS)  05/17/2022   IR TRANSCATH/EMBOLIZ  05/17/2022   IR US  GUIDE VASC ACCESS RIGHT  05/17/2022   IR US  GUIDE VASC ACCESS RIGHT  12/04/2022   LEFT HEART CATHETERIZATION WITH CORONARY ANGIOGRAM N/A 08/19/2012   Procedure: LEFT HEART CATHETERIZATION WITH CORONARY ANGIOGRAM;  Surgeon: Vinie KYM Maxcy, MD;  Location: [facility];  Service: Cardiovascular;  Laterality: N/A;   LOWER EXTREMITY ANGIOGRAM N/A 11/17/2012   Procedure: LOWER EXTREMITY ANGIOGRAM;  Surgeon: Dorn PARAS Lesches, MD;  Location: [facility] CATH LAB;  Service: Cardiovascular;  Laterality: N/A;   LOWER EXTREMITY ANGIOGRAM N/A 02/02/2013   Procedure: LOWER  EXTREMITY ANGIOGRAM;  Surgeon: Dorn PARAS Lesches, MD;  Location: [facility] CATH LAB;  Service: Cardiovascular;  Laterality: N/A;   LUMBAR DISC SURGERY     LUMBAR FUSION  ~ 2011   PATCH ANGIOPLASTY Right 08/05/2023   Procedure: PATCH ANGIOPLASTY;  Surgeon: Gretta Lonni PARAS, MD;  Location: [facility] OR;  Service: Vascular;  Laterality: Right;   PELVIC ABCESS DRAINAGE     x2   PERIPHERAL VASCULAR ATHERECTOMY  03/30/2021   Procedure: PERIPHERAL VASCULAR ATHERECTOMY;  Surgeon: Lesches Dorn PARAS, MD;  [facility];  Service: Cardiovascular;;  left common iliac artery   PERIPHERAL VASCULAR ATHERECTOMY  04/01/2023   Procedure: PERIPHERAL VASCULAR ATHERECTOMY;  Surgeon: Lesches Dorn PARAS, MD;  Location: [facility] INVASIVE CV LAB;  Service: Cardiovascular;;   PERIPHERAL VASCULAR BALLOON ANGIOPLASTY Left 04/12/2022   Procedure: PERIPHERAL VASCULAR BALLOON ANGIOPLASTY;  Surgeon: Lesches Dorn PARAS, MD;  [facility];  Service: Cardiovascular;  Laterality: Left;  SFA   PERIPHERAL VASCULAR BALLOON ANGIOPLASTY Right 04/01/2023   Procedure: PERIPHERAL VASCULAR BALLOON ANGIOPLASTY;  Surgeon: Lesches Dorn PARAS, MD;  [facility]: [facility];  Service: Cardiovascular;  Laterality: Right;  DCB - SFA   PERIPHERAL VASCULAR INTERVENTION  03/30/2021   Procedure: PERIPHERAL VASCULAR INTERVENTION;  Surgeon:  Court Dorn PARAS, MD;  [facility]: [facility];  Service: Cardiovascular;;   POLYPECTOMY  06/13/2017   Procedure: POLYPECTOMY;  Surgeon: Shaaron Lamar HERO, MD;  [facility];  Service: Endoscopy;;  colon   RADIOLOGY WITH ANESTHESIA N/A 05/17/2022   Procedure: Diagnostic Cerebral Angiogram, Possible Aneurysm Coiling, Possible Stent;  Surgeon: Lanis Pupa, MD;  Location: [facility] OR;  Service: Radiology;  Laterality: N/A;   SHOULDER SURGERY Right    TOTAL HIP ARTHROPLASTY Right 12/25/2022   Procedure: TOTAL HIP ARTHROPLASTY;  Surgeon: Josefina Chew, MD;  Location: [facility] ORS;  Service:  Orthopedics;  Laterality: Right;   TOTAL HIP ARTHROPLASTY Right    VASCULAR SURGERY Right 01/02/2013   diamondback orbital rotational  atherectomy, chocolate balloon and IDEV  Stent.    Prior to Admission medications   Medication Sig Start Date End Date Taking? Authorizing Provider  acetaminophen  (TYLENOL ) 650 MG CR tablet Take 1,300 mg by mouth daily as needed for pain.   Yes [provider]  albuterol  (VENTOLIN  HFA) 108 (90 Base) MCG/ACT inhaler Inhale 2 puffs into the lungs every 6 (six) hours as needed for wheezing or shortness of breath.   Yes [provider]  aspirin  EC 81 MG EC tablet Take 1 tablet (81 mg total) by mouth daily. 09/11/18  Yes Tat, Alm, MD  Cholecalciferol (VITAMIN D-3 PO) Take 1 capsule by mouth daily.   Yes [provider]  clopidogrel  (PLAVIX ) 75 MG tablet Take 1 tablet (75 mg total) by mouth daily with breakfast. 05/03/15  Yes Hilty, Vinie BROCKS, MD  empagliflozin  (JARDIANCE ) 25 MG TABS tablet Take 25 mg by mouth daily.   Yes [provider]  gabapentin  (NEURONTIN ) 800 MG tablet Take 800 mg by mouth 3 (three) times daily. 05/29/23  Yes [provider]  glipiZIDE  (GLUCOTROL  XL) 10 MG 24 hr tablet Take 10 mg by mouth 2 (two) times daily.   Yes [provider]  icosapent  Ethyl (VASCEPA ) 1 g capsule Take 2 g by mouth 2 (two) times daily.   Yes [provider]  isosorbide  mononitrate (IMDUR ) 60 MG 24 hr tablet Take 1 tablet (60 mg total) by mouth daily. 02/12/23  Yes Ricky Fines, MD  losartan  (COZAAR ) 50 MG tablet Take 50 mg by mouth daily. 04/27/22  Yes [provider]  metoprolol  succinate (TOPROL -XL) 25 MG 24 hr tablet TAKE 1 TABLET BY MOUTH ONCE A DAY. 05/07/23  Yes Hilty, Vinie BROCKS, MD  ondansetron  (ZOFRAN ) 4 MG tablet Take 1 tablet (4 mg total) by mouth every 8 (eight) hours as needed for nausea or vomiting. 12/25/22  Yes Brown, Blaine K, PA-C  oxyCODONE -acetaminophen  (PERCOCET) 10-325 MG tablet Take 1  tablet by mouth 4 (four) times daily as needed for pain. 02/20/21  Yes [provider]  rosuvastatin  (CRESTOR ) 40 MG tablet Take 1 tablet (40 mg total) by mouth daily. 09/10/18  Yes Tat, Alm, MD  thiamine  (VITAMIN B-1) 100 MG tablet Take 1 tablet (100 mg total) by mouth daily. 02/12/23  Yes Ricky Fines, MD  zolpidem  (AMBIEN  CR) 12.5 MG CR tablet Take 12.5 mg by mouth at bedtime. 06/15/20  Yes [provider]    Allergies as of 03/24/2024   (No Known Allergies)    Family History  Problem Relation Age of Onset   Anesthesia problems Neg Hx    Colon cancer Neg Hx    Gastric cancer Neg Hx    Esophageal cancer Neg Hx    Colon polyps Neg Hx  Social History   Socioeconomic History   Marital status: Single    Spouse name: Not on file   Number of children: 1   Years of education: Not on file   Highest education level: Not on file  Occupational History   Occupation: disabled    Employer: DISABLED  Tobacco Use   Smoking status: Every Day    Current packs/day: 1.50    Average packs/day: 1.5 packs/day for 39.0 years (58.5 ttl pk-yrs)    Types: Cigarettes   Smokeless tobacco: Never  Vaping Use   Vaping status: Never Used  Substance and Sexual Activity   Alcohol use: Yes    Alcohol/week: 12.0 standard drinks of alcohol    Types: 12 Cans of beer per week    Comment: weekly   Drug use: No   Sexual activity: Not Currently  Other Topics Concern   Not on file  Social History Narrative   Lives w/ girlfriend      Right handed   Wears glasses    Drinks sweet tea (rare)   Drinks diet mountain dew    Social Drivers of Health   Financial Resource Strain: Low Risk  (09/20/2023)   Received from [facility]   Overall Financial Resource Strain (CARDIA)    Difficulty of Paying Living Expenses: Not hard at all  Food Insecurity: No Food Insecurity (09/20/2023)   Received from [facility]   Hunger Vital Sign    Within the past 12 months, you worried that your food  would run out before you got the money to buy more.: Never true    Within the past 12 months, the food you bought just didn't last and you didn't have money to get more.: Never true  Transportation Needs: No Transportation Needs (09/20/2023)   Received from [facility] - Transportation    Lack of Transportation (Medical): No    Lack of Transportation (Non-Medical): No  Physical Activity: Unknown (03/20/2023)   Received from [facility]   Exercise Vital Sign    On average, how many days per week do you engage in moderate to strenuous exercise (like a brisk walk)?: 0 days    Minutes of Exercise per Session: Not on file  Stress: No Stress Concern Present (03/20/2023)   Received from [facility] - Occupational Stress Questionnaire    Feeling of Stress : Not at all  Social Connections: Moderately Integrated (03/20/2023)   Received from [facility]   Social Network    How would you rate your social network (family, work, friends)?: Adequate participation with social networks  Intimate Partner Violence: Not At Risk (08/05/2023)   Humiliation, Afraid, Rape, and Kick questionnaire    Fear of Current or Ex-Partner: No    Emotionally Abused: No    Physically Abused: No    Sexually Abused: No    Review of Systems: See HPI, otherwise negative ROS  Physical Exam: BP (!) 150/87 (BP Location: Right Arm, Patient Position: Sitting, Cuff Size: Large)   Pulse 64   Temp 98.4 F (36.9 C) (Oral)   Ht 5' 11 (1.803 m)   Wt 197 lb 9.6 oz (89.6 kg)   SpO2 93%   BMI 27.56 kg/m  General:   Alert,  Well-developed, well-nourished, pleasant and cooperative in NAD Heart:  Regular rate and rhythm; no murmurs, clicks, rubs,  or gallops. Abdomen: Diastases recti present.  Non-distended, normal bowel sounds.  Soft and nontender without appreciable mass  or hepatosplenomegaly.   Impression/Plan: 63 year old gentleman with intermittent abdominal cramps  and postprandial diarrhea.  No frank pain or sitophobia concerning for mesenteric ischemia.  Symptoms are in setting of excessive beer consumption poorly controlled diabetes and known diabetic neuropathy.  Recent colonoscopy reassuring.  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  Recommendations:  Serum CRP, tTG-IgA and total serum IgA this week  Levsin  0.125 mg sublingual tablets but 1 under the tongue or swallow 20 minutes before meals and at bedtime as needed.  Dispense 120 with 3 refills  I advise cutting back on beer consumption by 50%  Maximize control of blood sugars  Office visit with me in 6 to 8 weeks     Notice: This dictation was prepared with Dragon dictation along with smaller phrase technology. Any transcriptional errors that result from this process are unintentional and may not be corrected upon review.

## 2024-03-24 NOTE — Patient Instructions (Signed)
 It was good to see you again!  As discussed, your symptoms of postprandial abdominal cramping and diarrhea are likely due to irritable bowel syndrome from the effects of longstanding diabetes (neuropathy in your gut).  Alcohol (beer) consumption likely worsening the situation.  Serum CRP, tTG-IgA and total serum IgA this week  Levsin  0.125 mg sublingual tablets but 1 under the tongue or swallow 20 minutes before meals and at bedtime as needed.  Dispense 120 with 3 refills  I advise cutting back on beer consumption by 50%  Maximize control of blood sugars  Office visit with me in 6 to 8 weeks

## 2024-03-25 ENCOUNTER — Other Ambulatory Visit (HOSPITAL_COMMUNITY)
Admission: RE | Admit: 2024-03-25 | Discharge: 2024-03-25 | Disposition: A | Discharge status: Home / Self Care | Source: Ambulatory Visit | Attending: Internal Medicine | Admitting: Internal Medicine

## 2024-03-25 DIAGNOSIS — R197 Diarrhea, unspecified: Secondary | ICD-10-CM | POA: Insufficient documentation

## 2024-03-25 LAB — C-REACTIVE PROTEIN: CRP: 1.3 mg/dL — ABNORMAL HIGH (ref <1.0)

## 2024-03-26 LAB — IGA: IgA: 413 mg/dL (ref 61–437)

## 2024-03-26 LAB — TISSUE TRANSGLUTAMINASE, IGA: Tissue Transglutaminase Ab, IgA: <2 U/mL (ref 0–3)

## 2024-03-31 ENCOUNTER — Telehealth: Payer: Self-pay

## 2024-03-31 ENCOUNTER — Ambulatory Visit: Payer: Self-pay | Admitting: Internal Medicine

## 2024-03-31 DIAGNOSIS — R197 Diarrhea, unspecified: Secondary | ICD-10-CM

## 2024-03-31 NOTE — Telephone Encounter (Signed)
 Pt called stating that his insurance will not pay for the hyoscyamine . Please advise.

## 2024-04-01 ENCOUNTER — Other Ambulatory Visit: Payer: Self-pay

## 2024-04-01 MED ORDER — DICYCLOMINE HCL 20 MG PO TABS: 20.0000 mg | ORAL_TABLET | Freq: Three times a day (TID) | ORAL | 11 refills | Status: DC

## 2024-04-07 DIAGNOSIS — R197 Diarrhea, unspecified: Secondary | ICD-10-CM | POA: Diagnosis not present

## 2024-04-09 LAB — GI PROFILE, STOOL, PCR

## 2024-04-09 LAB — CALPROTECTIN, FECAL: Calprotectin, Fecal: 59 ug/g (ref 0–120)

## 2024-04-10 DIAGNOSIS — E1165 Type 2 diabetes mellitus with hyperglycemia: Secondary | ICD-10-CM | POA: Diagnosis not present

## 2024-04-10 DIAGNOSIS — E782 Mixed hyperlipidemia: Secondary | ICD-10-CM | POA: Diagnosis not present

## 2024-04-10 DIAGNOSIS — M48062 Spinal stenosis, lumbar region with neurogenic claudication: Secondary | ICD-10-CM | POA: Diagnosis not present

## 2024-04-10 DIAGNOSIS — A498 Other bacterial infections of unspecified site: Secondary | ICD-10-CM | POA: Diagnosis not present

## 2024-04-10 DIAGNOSIS — E559 Vitamin D deficiency, unspecified: Secondary | ICD-10-CM | POA: Diagnosis not present

## 2024-04-10 DIAGNOSIS — E1143 Type 2 diabetes mellitus with diabetic autonomic (poly)neuropathy: Secondary | ICD-10-CM | POA: Diagnosis not present

## 2024-04-10 DIAGNOSIS — Z0000 Encounter for general adult medical examination without abnormal findings: Secondary | ICD-10-CM | POA: Diagnosis not present

## 2024-04-10 DIAGNOSIS — Z79899 Other long term (current) drug therapy: Secondary | ICD-10-CM | POA: Diagnosis not present

## 2024-04-10 DIAGNOSIS — Z23 Encounter for immunization: Secondary | ICD-10-CM | POA: Diagnosis not present

## 2024-04-10 DIAGNOSIS — R338 Other retention of urine: Secondary | ICD-10-CM | POA: Diagnosis not present

## 2024-04-11 LAB — C DIFFICILE, CYTOTOXIN B

## 2024-04-11 LAB — C DIFFICILE TOXINS A+B W/RFLX: C difficile Toxins A+B, EIA: NEGATIVE

## 2024-04-28 ENCOUNTER — Encounter: Payer: Self-pay | Admitting: Gastroenterology

## 2024-04-28 ENCOUNTER — Ambulatory Visit (INDEPENDENT_AMBULATORY_CARE_PROVIDER_SITE_OTHER): Admitting: Gastroenterology

## 2024-04-28 VITALS — BP 119/68 | HR 67 | Temp 97.2°F | Ht 71.0 in | Wt 195.6 lb

## 2024-04-28 DIAGNOSIS — K5900 Constipation, unspecified: Secondary | ICD-10-CM | POA: Diagnosis not present

## 2024-04-28 NOTE — Patient Instructions (Addendum)
 We need to stop dicyclomine . This can cause constipation.  I would like for you to take 1 capful of Miralax  in 8 ounces of water  every hour for 6 hours. Take dulcolax 10 mg tablet (over the counter) 2 hours after starting the prep and take again at 4 hours after the prep.   If needed, you can repeat the Miralax  dosing again tomorrow.   Make sure to drink plenty of water , stay hydrated. Please call if any worsening symptoms!  Keep appointment coming up in October!  It was a pleasure to see you today. I want to create trusting relationships with patients and provide genuine, compassionate, and quality care. I truly value your feedback, so please be on the lookout for a survey regarding your visit with me today. I appreciate your time in completing this!         Therisa MICAEL Stager, PhD, ANP-BC [facility]

## 2024-04-28 NOTE — Progress Notes (Signed)
 Gastroenterology Office Note     Primary Care Physician:  Suanne Pfeiffer, NP  Primary Gastroenterologist: Dr. Shaaron    Chief Complaint   Chief Complaint  Patient presents with   Constipation    Pt arrives due to constipation. Pt states no BM since last Sunday(04/19/24)morning. Friday night pt went to drugstore and got liquid Dulcolax, had BM but it was water  and black. Pt had pain in top part of abdomen with swelling. Pt wife states he has lots of gas.      History of Present Illness   [name] is a 63 y.o. male presenting today with a history of intermittent abdominal cramping, bloating, diarrhea, last seen in Aug 2025 with labs completed and stool tests showing enteropathogenic E.coli on 9/2. He was prescribed Cipro BID for 10 days and completed this.   At last visit, he was also prescribed dicyclomine  20 mg for suspected IBS. Additional labs including CRP mildly elevated at 1.3, negative celiac serologies. Fecal cal normal at 59.   Now he is constipated. Last BM a week and a day ago. Took dulcolax with liquid output. Abdomen feels bloated and tight. Abdominal discomfort across middle at umbilicus. No vomiting. Eating 3 meals a day. No prior issues with constipation. Completed Cipro for E.coli. Has been taking dicyclomine  20 mg BID. Last dose last night. Passing flatus. Chronic percocet for back pain. Good appetite.    Colonoscopy June 2025: pancolonic diverticulosis, anal canal hemorrhoids, otherwise normal. 7 years surveillance.   colonoscopy 06/13/2017: -5 mm polyp in the sigmoid colon (hyperplastic) -Internal hemorrhoids -Repeat colonoscopy in 5 years  Past Medical History:  Diagnosis Date   Arthritis    some; in hips sometimes (01/02/2013)   Back pain    w/prolonged walking or standing (01/02/2013)   CAD (coronary artery disease)    CABG X 4 08/2012   COPD (chronic obstructive pulmonary disease) (HCC)    Elevated PSA    Esophageal erosions 05/24/2001    egd by Dr. Shaaron   GERD (gastroesophageal reflux disease)    H/O hiatal hernia    Headache    History of gout    Hyperlipidemia    Hypertension    not on any medication for htn at present   PAD (peripheral artery disease)    LEA DOPPLER, 11/11/2012 - moderate arterial insufficiency to lower extremities at rest, BILATERAL SFA-demonstrates occlusive disease with reconstitution of flow   S/P percutaneous transluminal angioplasty (PTA) with stent placement 03/29/21 03/31/2021   Tobacco abuse    Tubular adenoma of colon 08/28/2011   Type II diabetes mellitus (HCC)    Unintentional weight loss     Past Surgical History:  Procedure Laterality Date   ABDOMINAL AORTOGRAM W/LOWER EXTREMITY N/A 03/30/2021   Procedure: ABDOMINAL AORTOGRAM W/LOWER EXTREMITY;  Surgeon: Court Dorn PARAS, MD;  [facility]: [facility];  Service: Cardiovascular;  Laterality: N/A;   ABDOMINAL AORTOGRAM W/LOWER EXTREMITY N/A 04/12/2022   Procedure: ABDOMINAL AORTOGRAM W/LOWER EXTREMITY;  Surgeon: Court Dorn PARAS, MD;  Location: [facility];  Service: Cardiovascular;  Laterality: N/A;   ABDOMINAL AORTOGRAM W/LOWER EXTREMITY N/A 04/01/2023   Procedure: ABDOMINAL AORTOGRAM W/LOWER EXTREMITY;  Surgeon: Court Dorn PARAS, MD;  Location: [facility];  Service: Cardiovascular;  Laterality: N/A;   ANTERIOR CERVICAL DECOMP/DISCECTOMY FUSION  12/19/2011   Procedure: ANTERIOR CERVICAL DECOMPRESSION/DISCECTOMY FUSION 2 LEVELS;  Surgeon: Catalina CHRISTELLA Stains, MD;  Location: [facility];  Service: Neurosurgery;  Laterality: N/A;  Cervical four-five  Cervical five-six  Anterior cervical decompression/diskectomy, fusion, plate   ATHERECTOMY N/A 01/02/2013   Procedure: ATHERECTOMY;  Surgeon: Dorn JINNY Lesches, MD;  Location: [facility] CATH LAB;  Service: Cardiovascular;  Laterality: N/A;   BACK SURGERY     x4   CARDIAC CATHETERIZATION  08/19/2012   CABG warranted; Severe ostial LCx stenosis with mid to distal vessel occlusion    COLONOSCOPY N/A 02/03/2024   Procedure: COLONOSCOPY;  Surgeon: Shaaron Lamar HERO, MD;  Location: [facility];  Service: Endoscopy;  Laterality: N/A;  8:15 am, asa 3   COLONOSCOPY W/ POLYPECTOMY  08/28/2011   Rourk-tubular adenomas removed from ascending colon, suboptimal prep, diminutive rectal polyps   COLONOSCOPY WITH PROPOFOL  N/A 06/13/2017   Procedure: COLONOSCOPY WITH PROPOFOL ;  Surgeon: Shaaron Lamar HERO, MD;  [facility]: [facility];  Service: Endoscopy;  Laterality: N/A;  9:15am   CORONARY ARTERY BYPASS GRAFT  08/22/2012   Procedure: CORONARY ARTERY BYPASS GRAFTING (CABG);  Surgeon: Dallas KATHEE Jude, MD;  Location: [facility] OR;  Service: Open Heart Surgery;  Laterality: N/A;  CABG x four,  using left internal mammary artery and right leg greater saphenous vein harvested endoscopically   ENDARTERECTOMY FEMORAL Right 08/05/2023   Procedure: ENDARTERECTOMY FEMORAL with Profundoplasty;  Surgeon: Gretta Lonni JINNY, MD;  Location: [facility] OR;  Service: Vascular;  Laterality: Right;   ESOPHAGOGASTRODUODENOSCOPY  05/24/2001   by Dr. Shaaron   ESOPHAGOGASTRODUODENOSCOPY  08/28/2011   Rourk-erosive reflux esophagitis, gastric erosions   ESOPHAGOGASTRODUODENOSCOPY (EGD) WITH PROPOFOL  N/A 06/13/2017   Procedure: ESOPHAGOGASTRODUODENOSCOPY (EGD) WITH PROPOFOL ;  Surgeon: Shaaron Lamar HERO, MD;  [facility]: [facility];  Service: Endoscopy;  Laterality: N/A;   INSERTION OF ILIAC STENT Right 08/05/2023   Procedure: Insertion of Iliac Stent with Iliac Arteriogram;  Surgeon: Gretta Lonni JINNY, MD;  Location: [facility] OR;  Service: Vascular;  Laterality: Right;   INTRAOPERATIVE TRANSESOPHAGEAL ECHOCARDIOGRAM  08/22/2012   Procedure: INTRAOPERATIVE TRANSESOPHAGEAL ECHOCARDIOGRAM;  Surgeon: Dallas KATHEE Jude, MD;  Location: [facility] OR;  Service: Open Heart Surgery;  Laterality: N/A;   IR 3D INDEPENDENT WKST  05/17/2022   IR ANGIO INTRA EXTRACRAN SEL INTERNAL CAROTID UNI R MOD SED  05/17/2022   IR ANGIO INTRA EXTRACRAN SEL  INTERNAL CAROTID UNI R MOD SED  12/04/2022   IR ANGIOGRAM FOLLOW UP STUDY  05/17/2022   IR NEURO EACH ADD'L AFTER BASIC UNI RIGHT (MS)  05/17/2022   IR TRANSCATH/EMBOLIZ  05/17/2022   IR US  GUIDE VASC ACCESS RIGHT  05/17/2022   IR US  GUIDE VASC ACCESS RIGHT  12/04/2022   LEFT HEART CATHETERIZATION WITH CORONARY ANGIOGRAM N/A 08/19/2012   Procedure: LEFT HEART CATHETERIZATION WITH CORONARY ANGIOGRAM;  Surgeon: Vinie KYM Maxcy, MD;  Location: [facility] CATH [facility];  Service: Cardiovascular;  Laterality: N/A;   LOWER EXTREMITY ANGIOGRAM N/A 11/17/2012   Procedure: LOWER EXTREMITY ANGIOGRAM;  Surgeon: Dorn JINNY Lesches, MD;  Location: [facility] CATH LAB;  Service: Cardiovascular;  Laterality: N/A;   LOWER EXTREMITY ANGIOGRAM N/A 02/02/2013   Procedure: LOWER EXTREMITY ANGIOGRAM;  Surgeon: Dorn JINNY Lesches, MD;  Location: [facility] CATH LAB;  Service: Cardiovascular;  Laterality: N/A;   LUMBAR DISC SURGERY     LUMBAR FUSION  ~ 2011   PATCH ANGIOPLASTY Right 08/05/2023   Procedure: PATCH ANGIOPLASTY;  Surgeon: Gretta Lonni JINNY, MD;  Location: [facility] OR;  Service: Vascular;  Laterality: Right;   PELVIC ABCESS DRAINAGE     x2   PERIPHERAL VASCULAR ATHERECTOMY  03/30/2021   Procedure: PERIPHERAL VASCULAR ATHERECTOMY;  Surgeon: Lesches Dorn JINNY, MD;  [facility]: [facility];  Service: Cardiovascular;;  left common iliac artery   PERIPHERAL VASCULAR ATHERECTOMY  04/01/2023   Procedure: PERIPHERAL VASCULAR ATHERECTOMY;  Surgeon: Court Dorn PARAS, MD;  Location: [facility] INVASIVE CV LAB;  Service: Cardiovascular;;   PERIPHERAL VASCULAR BALLOON ANGIOPLASTY Left 04/12/2022   Procedure: PERIPHERAL VASCULAR BALLOON ANGIOPLASTY;  Surgeon: Court Dorn PARAS, MD;  [facility]: [facility];  Service: Cardiovascular;  Laterality: Left;  SFA   PERIPHERAL VASCULAR BALLOON ANGIOPLASTY Right 04/01/2023   Procedure: PERIPHERAL VASCULAR BALLOON ANGIOPLASTY;  Surgeon: Court Dorn PARAS, MD;  Location: [facility];  Service:  Cardiovascular;  Laterality: Right;  DCB - SFA   PERIPHERAL VASCULAR INTERVENTION  03/30/2021   Procedure: PERIPHERAL VASCULAR INTERVENTION;  Surgeon: Court Dorn PARAS, MD;  [facility]: [facility];  Service: Cardiovascular;;   POLYPECTOMY  06/13/2017   Procedure: POLYPECTOMY;  Surgeon: Shaaron Lamar HERO, MD;  [facility];  Service: Endoscopy;;  colon   RADIOLOGY WITH ANESTHESIA N/A 05/17/2022   Procedure: Diagnostic Cerebral Angiogram, Possible Aneurysm Coiling, Possible Stent;  Surgeon: Lanis Pupa, MD;  Location: [facility] OR;  Service: Radiology;  Laterality: N/A;   SHOULDER SURGERY Right    TOTAL HIP ARTHROPLASTY Right 12/25/2022   Procedure: TOTAL HIP ARTHROPLASTY;  Surgeon: Josefina Chew, MD;  Location: [facility] ORS;  Service: Orthopedics;  Laterality: Right;   TOTAL HIP ARTHROPLASTY Right    VASCULAR SURGERY Right 01/02/2013   diamondback orbital rotational  atherectomy, chocolate balloon and IDEV  Stent.    Current Outpatient Medications  Medication Sig Dispense Refill   acetaminophen  (TYLENOL ) 650 MG CR tablet Take 1,300 mg by mouth daily as needed for pain.     albuterol  (VENTOLIN  HFA) 108 (90 Base) MCG/ACT inhaler Inhale 2 puffs into the lungs every 6 (six) hours as needed for wheezing or shortness of breath.     aspirin  EC 81 MG EC tablet Take 1 tablet (81 mg total) by mouth daily.     Cholecalciferol (VITAMIN D-3 PO) Take 1 capsule by mouth daily.     clopidogrel  (PLAVIX ) 75 MG tablet Take 1 tablet (75 mg total) by mouth daily with breakfast. 30 tablet 11   dicyclomine  (BENTYL ) 20 MG tablet Take 1 tablet (20 mg total) by mouth 4 (four) times daily -  before meals and at bedtime. 60 tablet 11   empagliflozin  (JARDIANCE ) 25 MG TABS tablet Take 25 mg by mouth daily.     gabapentin  (NEURONTIN ) 800 MG tablet Take 800 mg by mouth 3 (three) times daily.     glipiZIDE  (GLUCOTROL  XL) 10 MG 24 hr tablet Take 10 mg by mouth 2 (two) times daily.     icosapent  Ethyl (VASCEPA ) 1  g capsule Take 2 g by mouth 2 (two) times daily.     isosorbide  mononitrate (IMDUR ) 60 MG 24 hr tablet Take 1 tablet (60 mg total) by mouth daily. 30 tablet 2   losartan  (COZAAR ) 50 MG tablet Take 50 mg by mouth daily.     metoprolol  succinate (TOPROL -XL) 25 MG 24 hr tablet TAKE 1 TABLET BY MOUTH ONCE A DAY. 90 tablet 3   ondansetron  (ZOFRAN ) 4 MG tablet Take 1 tablet (4 mg total) by mouth every 8 (eight) hours as needed for nausea or vomiting. 10 tablet 0   oxyCODONE -acetaminophen  (PERCOCET) 10-325 MG tablet Take 1 tablet by mouth 4 (four) times daily as needed for pain.     rosuvastatin  (CRESTOR ) 40 MG tablet Take 1 tablet (40 mg total)  by mouth daily. 30 tablet 1   thiamine  (VITAMIN B-1) 100 MG tablet Take 1 tablet (100 mg total) by mouth daily. 30 tablet 2   zolpidem  (AMBIEN  CR) 12.5 MG CR tablet Take 12.5 mg by mouth at bedtime.     No current facility-administered medications for this visit.    Allergies as of 04/28/2024   (No Known Allergies)    Family History  Problem Relation Age of Onset   Anesthesia problems Neg Hx    Colon cancer Neg Hx    Gastric cancer Neg Hx    Esophageal cancer Neg Hx    Colon polyps Neg Hx     Social History   Socioeconomic History   Marital status: Single    Spouse name: Not on file   Number of children: 1   Years of education: Not on file   Highest education level: Not on file  Occupational History   Occupation: disabled    Employer: DISABLED  Tobacco Use   Smoking status: Every Day    Current packs/day: 1.50    Average packs/day: 1.5 packs/day for 39.0 years (58.5 ttl pk-yrs)    Types: Cigarettes   Smokeless tobacco: Never  Vaping Use   Vaping status: Never Used  Substance and Sexual Activity   Alcohol use: Yes    Alcohol/week: 12.0 standard drinks of alcohol    Types: 12 Cans of beer per week    Comment: weekly   Drug use: No   Sexual activity: Not Currently  Other Topics Concern   Not on file  Social History Narrative    Lives w/ girlfriend      Right handed   Wears glasses    Drinks sweet tea (rare)   Drinks diet mountain dew    Social Drivers of Health   Financial Resource Strain: Low Risk  (04/10/2024)   Received from [facility]   Overall Financial Resource Strain (CARDIA)    How hard is it for you to pay for the very basics like food, housing, medical care, and heating?: Not very hard  Food Insecurity: No Food Insecurity (04/10/2024)   Received from [facility]   Hunger Vital Sign    Within the past 12 months, you worried that your food would run out before you got the money to buy more.: Never true    Within the past 12 months, the food you bought just didn't last and you didn't have money to get more.: Never true  Transportation Needs: No Transportation Needs (04/10/2024)   Received from [facility] - Transportation    In the past 12 months, has lack of transportation kept you from medical appointments or from getting medications?: No    In the past 12 months, has lack of transportation kept you from meetings, work, or from getting things needed for daily living?: No  Physical Activity: Inactive (04/10/2024)   Received from [facility]   Exercise Vital Sign    On average, how many days per week do you engage in moderate to strenuous exercise (like a brisk walk)?: 0 days    Minutes of Exercise per Session: Not on file  Stress: No Stress Concern Present (04/10/2024)   Received from [facility] - Occupational Stress Questionnaire    Do you feel stress - tense, restless, nervous, or anxious, or unable to sleep at night because your mind is troubled all the time - these days?: Only a little  Social Connections: Socially Integrated (04/10/2024)   Received from [facility]   Social Network    How would you rate your social network (family, work, friends)?: Good participation with social networks  Intimate Partner Violence: Not At Risk (04/10/2024)    Received from [facility]   HITS    Over the last 12 months how often did your partner physically hurt you?: Never    Over the last 12 months how often did your partner insult you or talk down to you?: Never    Over the last 12 months how often did your partner threaten you with physical harm?: Never    Over the last 12 months how often did your partner scream or curse at you?: Never     Review of Systems   Gen: Denies any fever, chills, fatigue, weight loss, lack of appetite.  CV: Denies chest pain, heart palpitations, peripheral edema, syncope.  Resp: Denies shortness of breath at rest or with exertion. Denies wheezing or cough.  GI: Denies dysphagia or odynophagia. Denies jaundice, hematemesis, fecal incontinence. GU : Denies urinary burning, urinary frequency, urinary hesitancy MS: Denies joint pain, muscle weakness, cramps, or limitation of movement.  Derm: Denies rash, itching, dry skin Psych: Denies depression, anxiety, memory loss, and confusion Heme: Denies bruising, bleeding, and enlarged lymph nodes.   Physical Exam   BP 119/68   Pulse 67   Temp (!) 97.2 F (36.2 C)   Ht 5' 11 (1.803 m)   Wt 195 lb 9.6 oz (88.7 kg)   BMI 27.28 kg/m  General:   Alert and oriented. Pleasant and cooperative. Well-nourished and well-developed.  Head:  Normocephalic and atraumatic. Eyes:  Without icterus Abdomen:  +BS, round but soft, no TTP, no rebound or guarding.  Rectal:  without impaction, no mass.  Msk:  Symmetrical without gross deformities. Normal posture. Extremities:  Without edema. Neurologic:  Alert and  oriented x4;  grossly normal neurologically. Skin:  Intact without significant lesions or rashes. Psych:  Alert and cooperative. Normal mood and affect.   Assessment/Plan:    [name] is a 63 y.o. male presenting today with a history of intermittent abdominal cramping, bloating, diarrhea, last seen in Aug 2025 and felt to have element of IBS, prescribed  dicyclomine  20 mg, and subsequently found to have E.coli on stool tests with antibiotics prescribed and completed. Now presenting with new onset constipation.  Constipation likely stemming from continued dicyclomine  use BID without need as diarrhea resolved. He has no obstructive signs, no concerning signs on physical exam. DRE without mass or impaction. I have asked that he stop dicyclomine . I am holding off on xray, as his exam is benign.   Recommend Miralax  prep today, adding dulcolax twice as noted in prep outline. Stop dicyclomine . He may repeat tomorrow if needed.   Keep upcoming appt 10/14. Call if no improvement or any abdominal pain, vomiting, etc.   Colonoscopy up-to-date as of June 2025.     Therisa MICAEL Stager, PhD, ANP-BC [facility] Gastroenterology

## 2024-05-08 DIAGNOSIS — M542 Cervicalgia: Secondary | ICD-10-CM | POA: Diagnosis not present

## 2024-05-08 DIAGNOSIS — M25511 Pain in right shoulder: Secondary | ICD-10-CM | POA: Diagnosis not present

## 2024-05-08 DIAGNOSIS — B029 Zoster without complications: Secondary | ICD-10-CM | POA: Diagnosis not present

## 2024-05-10 ENCOUNTER — Other Ambulatory Visit: Payer: Self-pay | Admitting: Internal Medicine

## 2024-05-19 ENCOUNTER — Ambulatory Visit: Admitting: Internal Medicine

## 2024-06-02 ENCOUNTER — Ambulatory Visit: Admitting: Internal Medicine

## 2024-06-09 ENCOUNTER — Telehealth: Payer: Self-pay | Admitting: Cardiovascular Disease

## 2024-06-09 NOTE — Telephone Encounter (Signed)
 Left message to call office.  Patient has appointment with Dr Court on 11/7

## 2024-06-09 NOTE — Telephone Encounter (Signed)
 Pt c/o swelling: STAT is pt has developed SOB within 24 hours  How much weight have you gained and in what time span?  Patient unsure if he gained weight. Says he doesn't keep track and only checks weight during appointments.  If swelling, where is the swelling located?  Left calf, foot, and ankle. No swelling in right leg.  Are you currently taking a fluid pill?  No   Are you currently SOB?  No   Do you have a log of your daily weights (if so, list)?   Have you gained 3 pounds in a day or 5 pounds in a week?   Have you traveled recently?

## 2024-06-10 NOTE — Telephone Encounter (Signed)
 Pt was returning nurse call and is requesting a callback. Please advise.

## 2024-06-10 NOTE — Telephone Encounter (Signed)
 Spoke with pt regarding his swelling. Pt stated his left calf, foot and ankle are swollen. Pt stated his leg feels tight but is not painful and is not red. Pt is unsure if he has gained any weight. Pt stated that when he sleeps the swelling goes down but then starts back up when he gets up in the morning. Pt was given Ed precautions and has an appointment with Dr. Court on 11/7. Pt was told that Dr. Court would be notified of his condition. Pt verbalized understanding. All questions if any were answered.

## 2024-06-12 ENCOUNTER — Ambulatory Visit: Attending: Cardiovascular Disease | Admitting: Cardiovascular Disease

## 2024-06-12 ENCOUNTER — Encounter: Payer: Self-pay | Admitting: Cardiovascular Disease

## 2024-06-12 VITALS — BP 148/76 | HR 80 | Ht 71.0 in | Wt 194.2 lb

## 2024-06-12 DIAGNOSIS — I739 Peripheral vascular disease, unspecified: Secondary | ICD-10-CM | POA: Diagnosis not present

## 2024-06-12 DIAGNOSIS — I10 Essential (primary) hypertension: Secondary | ICD-10-CM

## 2024-06-12 DIAGNOSIS — E782 Mixed hyperlipidemia: Secondary | ICD-10-CM

## 2024-06-12 DIAGNOSIS — Z951 Presence of aortocoronary bypass graft: Secondary | ICD-10-CM

## 2024-06-12 DIAGNOSIS — Z720 Tobacco use: Secondary | ICD-10-CM | POA: Diagnosis not present

## 2024-06-12 NOTE — Assessment & Plan Note (Signed)
 History of hyperlipidemia on Crestor  with lipid profile performed 08/06/2023 revealing total cholesterol 106, LDL of 9 HDL 41.

## 2024-06-12 NOTE — Assessment & Plan Note (Signed)
 History of PAD status post multiple lower extremity interventions by myself and Dr. Myra.  I have intervened on his right SFA with diamondback orbital rotational atherectomy, chocolate balloon angioplasty, and I DEB stenting.  I performed orbital atherectomy and VBX stenting of a high-grade calcified left common iliac artery stenosis 03/30/2021.  Dr. Myra recanalized and left SFA CTO back in 2022.  I angiogram to him 04/01/2023 revealing disease beyond the previously placed right SFA stent.  Performed orbital atherectomy and drug-coated balloon angioplasty with excellent result.  He has separately undergone right common femoral endarterectomy with patch angioplasty also involving the proximal right SFA as well as right iliac stenting by Dr. Gretta 08/05/2023.  He has not had Doppler study since.  He does complain of some swelling in his left calf at the end of the day and some pain in his left calf with ambulation.  He denies pain in his right leg.  He has had a right total hip replacement in December of last year.

## 2024-06-12 NOTE — Assessment & Plan Note (Signed)
 History of CAD status post catheterization performed by Dr. Mona 08/19/2012 which ultimately led to CABG x 4 with a LIMA to his LAD, vein to diagonal branch, ramus intermedius branch and RCA.  He has had no cardiac issues since.  He denies chest pain.

## 2024-06-12 NOTE — Patient Instructions (Signed)
 Medication Instructions:  Your physician recommends that you continue on your current medications as directed. Please refer to the Current Medication list given to you today.  *If you need a refill on your cardiac medications before your next appointment, please call your pharmacy*  Testing/Procedures: Your physician has requested that you have an echocardiogram. Echocardiography is a painless test that uses sound waves to create images of your heart. It provides your doctor with information about the size and shape of your heart and how well your heart's chambers and valves are working. This procedure takes approximately one hour. There are no restrictions for this procedure. Please do NOT wear cologne, perfume, aftershave, or lotions (deodorant is allowed). Please arrive 15 minutes prior to your appointment time.  Please note: We ask at that you not bring children with you during ultrasound (echo/ vascular) testing. Due to room size and safety concerns, children are not allowed in the ultrasound rooms during exams. Our front office staff cannot provide observation of children in our lobby area while testing is being conducted. An adult accompanying a patient to their appointment will only be allowed in the ultrasound room at the discretion of the ultrasound technician under special circumstances. We apologize for any inconvenience.   Your physician has requested that you have an Aorta/Iliac Duplex. This will take place at [address] [facility], 4th floor  No food after 11PM the night before.  Water  is OK. (Don't drink liquids if you have been instructed not to for ANOTHER test) Avoid foods that produce bowel gas, for 24 hours prior to exam (see below). No breakfast, no chewing gum, no smoking or carbonated beverages. Patient may take morning medications with water . Come in for test at least 15 minutes early to register.  Please note: We ask at that you not bring children with you during ultrasound  (echo/ vascular) testing. Due to room size and safety concerns, children are not allowed in the ultrasound rooms during exams. Our front office staff cannot provide observation of children in our lobby area while testing is being conducted. An adult accompanying a patient to their appointment will only be allowed in the ultrasound room at the discretion of the ultrasound technician under special circumstances. We apologize for any inconvenience.  Your physician has requested that you have a lower extremity arterial duplex. During this test, ultrasound is used to evaluate arterial blood flow in the legs. Allow one hour for this exam. There are no restrictions or special instructions. This will take place at [address], 4th floor  Please note: We ask at that you not bring children with you during ultrasound (echo/ vascular) testing. Due to room size and safety concerns, children are not allowed in the ultrasound rooms during exams. Our front office staff cannot provide observation of children in our lobby area while testing is being conducted. An adult accompanying a patient to their appointment will only be allowed in the ultrasound room at the discretion of the ultrasound technician under special circumstances. We apologize for any inconvenience.  Your physician has requested that you have an ankle brachial index (ABI). During this test an ultrasound and blood pressure cuff are used to evaluate the arteries that supply the arms and legs with blood. Allow thirty minutes for this exam. There are no restrictions or special instructions. This will take place at [address], 4th floor   Please note: We ask at that you not bring children with you during ultrasound (echo/ vascular) testing. Due to  room size and safety concerns, children are not allowed in the ultrasound rooms during exams. Our front office staff cannot provide observation of children in our lobby area while testing is being conducted.  An adult accompanying a patient to their appointment will only be allowed in the ultrasound room at the discretion of the ultrasound technician under special circumstances. We apologize for any inconvenience.    Follow-Up: At [facility], you and your health needs are our priority.  As part of our continuing mission to provide you with exceptional heart care, our providers are all part of one team.  This team includes your primary Cardiologist (physician) and Advanced Practice Providers or APPs (Physician Assistants and Nurse Practitioners) who all work together to provide you with the care you need, when you need it.  Your next appointment:   3 month(s)  Provider:   Dorn Lesches, MD    We recommend signing up for the patient portal called MyChart.  Sign up information is provided on this After Visit Summary.  MyChart is used to connect with patients for Virtual Visits (Telemedicine).  Patients are able to view lab/test results, encounter notes, upcoming appointments, etc.  Non-urgent messages can be sent to your provider as well.   To learn more about what you can do with MyChart, go to [email or web address].   Other Instructions

## 2024-06-12 NOTE — Assessment & Plan Note (Signed)
 History of essential hypertension with blood pressure measured today at 148/76.  He is on losartan  and metoprolol .

## 2024-06-12 NOTE — Progress Notes (Signed)
 06/12/2024 [name]   27-Feb-1961  [id number]  Primary Physician Suanne Pfeiffer, NP Primary Cardiologist: Dorn JINNY Lesches MD [name], [place]  HPI:  [name] is a 63 y.o.     mildly overweight single, though lives with his girlfriend [name], Caucasian male father of one child seen for peripheral vascular disease because of claudication and diminished ABIs.  I last saw him in the office 04/24/2023.SABRA  He is accompanied by his wife [name] today.  Risk factors include continued tobacco abuse of one pack -2 pks per day, diabetes and hyperlipidemia. He was catheterized by Dr. Dia 08/19/2012 demonstrated three-vessel disease. He ultimately underwent coronary artery bypass grafting x4 January 17 for LIMA to his LAD, a vein to the diagonal branch, ramus intermedius branch and to the RCA. He tolerated the procedure well. His EF was 50-55% by 2-D echo. Dopplers revealed ABIs of 0.5 and 0.7. He complaind of left greater than right claudication at one block. He underwent percutaneous revascularization using the Viance CTO catheter, diamondback orbital rotational atherectomy, chocolate balloon and IDEV Stent successfully to the mid Rt SFA. He enjoyed an excellent angiographic and clinical result with improvement Dopplers. He underwent an attempt at percutaneous revascularization of his long segment calcified left SFA CTO for lifestyle limiting claudication.I was unable to cross the lesion with a Viance CTO catheter.the patient presents in followup. Continues to have left lower extremity claudication. Also continues to smoke. We discussed options including retrograde attempt at history catheterization by Dr. Zachary Reed in [place] versus femoropopliteal bypass grafting. Since she's had coronary bypass grafting, I suspect he does not have adequate venous conduits left to perform lower shimmy surgical room randomization and what was thought to require PTFE.  I did send him to [facility] where  Dr. Zachary Reed been 6 hours revascularizing a left SFA CTO successfully.  He is continue to smoke a pack a day.  Over the last several months he has noticed increasing left greater than right lower extremity lifestyle of any claudication with recent Doppler studies performed 03/16/2021 revealing a right ABI of 0.80 and a left of 0.74.  He does have a high-frequency signal in his left SFA.  He wishes to proceed with outpatient endovascular therapy.   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  He underwent orbital atherectomy followed by VBX covered stenting (8 mm x 29 mm).  He was discharged home the following day.  His Dopplers performed 04/03/2021 revealed a widely patent stent.  His claudication resolved after that time.  He does unfortunately continue to smoke.   He has had recurrent claudication symptoms beginning of this year.  He had Dopplers performed 02/20/2022 revealing right ABI of 0.73 and a left ABI of 0.61.  He does have a high-frequency signal in his proximal left SFA with patent SFA stents bilaterally.  He wishes to proceed with outpatient peripheral angiography.   I performed angiography and intervention on him 04/12/2022 revealing patent left common iliac artery stent, patent bilateral SFA stents and a subtotally occluded calcified proximal left left SFA stenosis which I intervened on using chocolate balloon angioplasty followed by DCB.  His claudication had resolved and his Dopplers have markedly improved.  He does continue to smoke unfortunately.   Since I saw him 3 months ago I had intended perform right lower extremity intervention however this was delayed because of a intracranial coiling procedure.  He ultimately underwent right total hip replacement with Dr. Josefina in had pneumonia as well.  He returns now because of right calf and foot pain and wishes to proceed with outpatient  peripheral angiography and endovascular therapy.  He does continue to smoke a pack and a half a day.  I performed peripheral angiography on him 04/01/2023 revealing a patent right SFA stent with segmental high-grade calcific disease beyond that.  I performed orbital atherectomy followed by [facility] with excellent result although his ABI only increased from 0.55-0.66.  His claudication resolved.  He ultimately underwent right common femoral and proximal right SFA endarterectomy with bovine patch angioplasty as well as right external iliac artery stenting by Dr. Gretta 08/05/2023.  He does complain of some left calf claudication as well as some swelling in his left calf towards the end of the day.  He continues to smoke 1-1/2 packs/day.  He denies chest pain but does complain of some dyspnea.   Current Meds  Medication Sig   acetaminophen  (TYLENOL ) 650 MG CR tablet Take 1,300 mg by mouth daily as needed for pain.   albuterol  (VENTOLIN  HFA) 108 (90 Base) MCG/ACT inhaler Inhale 2 puffs into the lungs every 6 (six) hours as needed for wheezing or shortness of breath.   aspirin  EC 81 MG EC tablet Take 1 tablet (81 mg total) by mouth daily.   Cholecalciferol (VITAMIN D-3 PO) Take 1 capsule by mouth daily.   clopidogrel  (PLAVIX ) 75 MG tablet Take 1 tablet (75 mg total) by mouth daily with breakfast.   dicyclomine  (BENTYL ) 20 MG tablet Take 1 tablet (20 mg total) by mouth 4 (four) times daily -  before meals and at bedtime.   empagliflozin  (JARDIANCE ) 25 MG TABS tablet Take 25 mg by mouth daily.   gabapentin  (NEURONTIN ) 800 MG tablet Take 800 mg by mouth 3 (three) times daily.   glipiZIDE  (GLUCOTROL  XL) 10 MG 24 hr tablet Take 10 mg by mouth 2 (two) times daily.   icosapent  Ethyl (VASCEPA ) 1 g capsule Take 2 g by mouth 2 (two) times daily.   isosorbide  mononitrate (IMDUR ) 60 MG 24 hr tablet Take 1 tablet (60 mg total) by mouth daily.   losartan  (COZAAR ) 50 MG tablet Take 50 mg by mouth daily.   metoprolol   succinate (TOPROL -XL) 25 MG 24 hr tablet TAKE 1 TABLET BY MOUTH ONCE A DAY.   ondansetron  (ZOFRAN ) 4 MG tablet Take 1 tablet (4 mg total) by mouth every 8 (eight) hours as needed for nausea or vomiting.   oxyCODONE -acetaminophen  (PERCOCET) 10-325 MG tablet Take 1 tablet by mouth 4 (four) times daily as needed for pain.   rosuvastatin  (CRESTOR ) 40 MG tablet Take 1 tablet (40 mg total) by mouth daily.   thiamine  (VITAMIN B-1) 100 MG tablet Take 1 tablet (100 mg total) by mouth daily.   zolpidem  (AMBIEN  CR) 12.5 MG CR tablet Take 12.5 mg by mouth at bedtime.     No Known Allergies  Social History   Socioeconomic History   Marital status: Single    Spouse name: Not on file   Number of children: 1   Years of education: Not on file   Highest education level: Not on file  Occupational History   Occupation: disabled    Employer: DISABLED  Tobacco Use   Smoking status: Every Day    Current packs/day: 1.50    Average packs/day: 1.5 packs/day for 39.0 years (58.5 ttl pk-yrs)    Types: Cigarettes   Smokeless tobacco: Never  Vaping Use   Vaping status: Never Used  Substance and Sexual Activity   Alcohol use: Yes    Alcohol/week: 12.0 standard drinks of alcohol    Types: 12 Cans of beer per week    Comment: weekly   Drug use: No   Sexual activity: Not Currently  Other Topics Concern   Not on file  Social History Narrative   Lives w/ girlfriend      Right handed   Wears glasses    Drinks sweet tea (rare)   Drinks diet mountain dew    Social Drivers of Health   Financial Resource Strain: Low Risk  (04/10/2024)   Received from [facility]   Overall Financial Resource Strain (CARDIA)    How hard is it for you to pay for the very basics like food, housing, medical care, and heating?: Not very hard  Food Insecurity: No Food Insecurity (04/10/2024)   Received from [facility]   Hunger Vital Sign    Within the past 12 months, you worried that your food would run out before you got  the money to buy more.: Never true    Within the past 12 months, the food you bought just didn't last and you didn't have money to get more.: Never true  Transportation Needs: No Transportation Needs (04/10/2024)   Received from [facility] - Transportation    In the past 12 months, has lack of transportation kept you from medical appointments or from getting medications?: No    In the past 12 months, has lack of transportation kept you from meetings, work, or from getting things needed for daily living?: No  Physical Activity: Inactive (04/10/2024)   Received from [facility]   Exercise Vital Sign    On average, how many days per week do you engage in moderate to strenuous exercise (like a brisk walk)?: 0 days    Minutes of Exercise per Session: Not on file  Stress: No Stress Concern Present (04/10/2024)   Received from [facility] - Occupational Stress Questionnaire    Do you feel stress - tense, restless, nervous, or anxious, or unable to sleep at night because your mind is troubled all the time - these days?: Only a little  Social Connections: Socially Integrated (04/10/2024)   Received from [facility]   Social Network    How would you rate your social network (family, work, friends)?: Good participation with social networks  Intimate Partner Violence: Not At Risk (04/10/2024)   Received from [facility]   HITS    Over the last 12 months how often did your partner physically hurt you?: Never    Over the last 12 months how often did your partner insult you or talk down to you?: Never    Over the last 12 months how often did your partner threaten you with physical harm?: Never    Over the last 12 months how often did your partner scream or curse at you?: Never     Review of Systems: General: negative for chills, fever, night sweats or weight changes.  Cardiovascular: negative for chest pain, dyspnea on exertion, edema, orthopnea,  palpitations, paroxysmal nocturnal dyspnea or shortness of breath Dermatological: negative for rash Respiratory: negative for cough or wheezing Urologic: negative for hematuria Abdominal: negative for nausea, vomiting, diarrhea, bright red blood per rectum, melena, or hematemesis Neurologic: negative for visual changes, syncope, or dizziness All other systems reviewed and are otherwise  negative except as noted above.    Blood pressure (!) 148/76, pulse 80, height 5' 11 (1.803 m), weight 194 lb 3.2 oz (88.1 kg), SpO2 93%.  General appearance: alert and no distress Neck: no adenopathy, no carotid bruit, no JVD, supple, symmetrical, trachea midline, and thyroid  not enlarged, symmetric, no tenderness/mass/nodules Lungs: clear to auscultation bilaterally Heart: regular rate and rhythm, S1, S2 normal, no murmur, click, rub or gallop Extremities: extremities normal, atraumatic, no cyanosis or edema Pulses: 2+ and symmetric Skin: Skin color, texture, turgor normal. No rashes or lesions Neurologic: Grossly normal  EKG EKG Interpretation Date/Time:  Friday June 12 2024 11:09:36 EST Ventricular Rate:  72 PR Interval:  206 QRS Duration:  104 QT Interval:  404 QTC Calculation: 442 R Axis:   -20  Text Interpretation: Normal sinus rhythm Anteroseptal infarct , age undetermined When compared with ECG of 09-Feb-2023 23:32, PREVIOUS ECG IS PRESENT Confirmed by Court Carrier [phone]) on 06/12/2024 11:11:09 AM    ASSESSMENT AND PLAN:   Essential hypertension History of essential hypertension with blood pressure measured today at 148/76.  He is on losartan  and metoprolol .  Tobacco abuse Ongoing tobacco abuse of 1-1/2 packs/day recalcitrant to risk factor modification.  S/P CABG x 4 History of CAD status post catheterization performed by Dr. Mona 08/19/2012 which ultimately led to CABG x 4 with a LIMA to his LAD, vein to diagonal branch, ramus intermedius branch and RCA.  He has had no  cardiac issues since.  He denies chest pain.  PVD (peripheral vascular disease) with claudication (HCC) History of PAD status post multiple lower extremity interventions by myself and Dr. Myra.  I have intervened on his right SFA with diamondback orbital rotational atherectomy, chocolate balloon angioplasty, and I DEB stenting.  I performed orbital atherectomy and VBX stenting of a high-grade calcified left common iliac artery stenosis 03/30/2021.  Dr. Myra recanalized and left SFA CTO back in 2022.  I angiogram to him 04/01/2023 revealing disease beyond the previously placed right SFA stent.  Performed orbital atherectomy and drug-coated balloon angioplasty with excellent result.  He has separately undergone right common femoral endarterectomy with patch angioplasty also involving the proximal right SFA as well as right iliac stenting by Dr. Gretta 08/05/2023.  He has not had Doppler study since.  He does complain of some swelling in his left calf at the end of the day and some pain in his left calf with ambulation.  He denies pain in his right leg.  He has had a right total hip replacement in December of last year.  Mixed hyperlipidemia History of hyperlipidemia on Crestor  with lipid profile performed 08/06/2023 revealing total cholesterol 106, LDL of 9 HDL 41.     Carrier DOROTHA Court MD FACP,FACC,FAHA, [facility] 06/12/2024 11:13 AM

## 2024-06-12 NOTE — Assessment & Plan Note (Signed)
Ongoing tobacco abuse of 1-1/2 packs/day recalcitrant to risk factor modification. 

## 2024-07-02 ENCOUNTER — Other Ambulatory Visit: Payer: Self-pay | Admitting: Cardiovascular Disease

## 2024-07-07 ENCOUNTER — Ambulatory Visit: Admitting: Internal Medicine

## 2024-07-07 ENCOUNTER — Encounter: Payer: Self-pay | Admitting: Internal Medicine

## 2024-07-07 ENCOUNTER — Other Ambulatory Visit: Payer: Self-pay

## 2024-07-07 VITALS — BP 138/70 | HR 76 | Temp 97.5°F | Ht 71.0 in | Wt 197.4 lb

## 2024-07-07 DIAGNOSIS — K5900 Constipation, unspecified: Secondary | ICD-10-CM | POA: Diagnosis not present

## 2024-07-07 DIAGNOSIS — K219 Gastro-esophageal reflux disease without esophagitis: Secondary | ICD-10-CM

## 2024-07-07 DIAGNOSIS — R1013 Epigastric pain: Secondary | ICD-10-CM

## 2024-07-07 DIAGNOSIS — Z860100 Personal history of colon polyps, unspecified: Secondary | ICD-10-CM

## 2024-07-07 DIAGNOSIS — R197 Diarrhea, unspecified: Secondary | ICD-10-CM | POA: Diagnosis not present

## 2024-07-07 MED ORDER — PANTOPRAZOLE SODIUM 40 MG PO TBEC: 40.0000 mg | DELAYED_RELEASE_TABLET | Freq: Every day | ORAL | 11 refills | Status: AC

## 2024-07-07 NOTE — Progress Notes (Unsigned)
 Gastroenterology Progress Note    Primary Care Physician:  Suanne Pfeiffer, NP Primary Gastroenterologist:  Dr. Shaaron  Pre-Procedure History & Physical: HPI:  [name] is a 63 y.o. male here for here for follow-up of altered bowel function consistent with IBS.  Patient has rapidly changing bowel symptoms may have constipation 1 day diarrhea the next.  Takes MiraLAX  alternating with Bentyl  on an as-needed basis.  Does not have postprandial abdominal pain.  Has a history of reflux esophagitis but denies reflux symptoms does have upper abdominal discomfort on a regular basis.  No esophageal dysphagia.  He has PAD and cerebrovascular disease.  He has multiple stents.  Pancolonic diverticulosis earlier this year on colonoscopy.  Colonic polyps.  Due for 1 more colonoscopy in 7 years. Discussed his alcohol intake in some detail.  He does admit to at least 24 cans of beer weekly  Past Medical History:  Diagnosis Date   Arthritis    some; in hips sometimes (01/02/2013)   Back pain    w/prolonged walking or standing (01/02/2013)   CAD (coronary artery disease)    CABG X 4 08/2012   COPD (chronic obstructive pulmonary disease) (HCC)    Elevated PSA    Esophageal erosions 05/24/2001   egd by Dr. Shaaron   GERD (gastroesophageal reflux disease)    H/O hiatal hernia    Headache    History of gout    Hyperlipidemia    Hypertension    not on any medication for htn at present   PAD (peripheral artery disease)    LEA DOPPLER, 11/11/2012 - moderate arterial insufficiency to lower extremities at rest, BILATERAL SFA-demonstrates occlusive disease with reconstitution of flow   S/P percutaneous transluminal angioplasty (PTA) with stent placement 03/29/21 03/31/2021   Tobacco abuse    Tubular adenoma of colon 08/28/2011   Type II diabetes mellitus (HCC)    Unintentional weight loss     Past Surgical History:  Procedure Laterality Date   ABDOMINAL AORTOGRAM W/LOWER EXTREMITY N/A 03/30/2021    Procedure: ABDOMINAL AORTOGRAM W/LOWER EXTREMITY;  Surgeon: Court Dorn PARAS, MD;  [facility]: [facility];  Service: Cardiovascular;  Laterality: N/A;   ABDOMINAL AORTOGRAM W/LOWER EXTREMITY N/A 04/12/2022   Procedure: ABDOMINAL AORTOGRAM W/LOWER EXTREMITY;  Surgeon: Court Dorn PARAS, MD;  Location: [facility];  Service: Cardiovascular;  Laterality: N/A;   ABDOMINAL AORTOGRAM W/LOWER EXTREMITY N/A 04/01/2023   Procedure: ABDOMINAL AORTOGRAM W/LOWER EXTREMITY;  Surgeon: Court Dorn PARAS, MD;  Location: [facility];  Service: Cardiovascular;  Laterality: N/A;   ANTERIOR CERVICAL DECOMP/DISCECTOMY FUSION  12/19/2011   Procedure: ANTERIOR CERVICAL DECOMPRESSION/DISCECTOMY FUSION 2 LEVELS;  Surgeon: Catalina CHRISTELLA Stains, MD;  Location: [facility];  Service: Neurosurgery;  Laterality: N/A;  Cervical four-five Cervical five-six  Anterior cervical decompression/diskectomy, fusion, plate   ATHERECTOMY N/A 01/02/2013   Procedure: ATHERECTOMY;  Surgeon: Dorn PARAS Court, MD;  Location: [facility] CATH LAB;  Service: Cardiovascular;  Laterality: N/A;   BACK SURGERY     x4   CARDIAC CATHETERIZATION  08/19/2012   CABG warranted; Severe ostial LCx stenosis with mid to distal vessel occlusion   COLONOSCOPY N/A 02/03/2024   Procedure: COLONOSCOPY;  Surgeon: Shaaron Lamar CHRISTELLA, MD;  Location: [facility];  Service: Endoscopy;  Laterality: N/A;  8:15 am, asa 3   COLONOSCOPY W/ POLYPECTOMY  08/28/2011   Rosio Weiss-tubular adenomas removed from ascending colon, suboptimal prep, diminutive rectal polyps   COLONOSCOPY WITH PROPOFOL  N/A 06/13/2017  Procedure: COLONOSCOPY WITH PROPOFOL ;  Surgeon: Shaaron Lamar HERO, MD;  [facility]: [facility];  Service: Endoscopy;  Laterality: N/A;  9:15am   CORONARY ARTERY BYPASS GRAFT  08/22/2012   Procedure: CORONARY ARTERY BYPASS GRAFTING (CABG);  Surgeon: Dallas KATHEE Jude, MD;  Location: [facility] OR;  Service: Open Heart Surgery;  Laterality: N/A;  CABG x four,  using left  internal mammary artery and right leg greater saphenous vein harvested endoscopically   ENDARTERECTOMY FEMORAL Right 08/05/2023   Procedure: ENDARTERECTOMY FEMORAL with Profundoplasty;  Surgeon: Gretta Lonni PARAS, MD;  Location: [facility] OR;  Service: Vascular;  Laterality: Right;   ESOPHAGOGASTRODUODENOSCOPY  05/24/2001   by Dr. Shaaron   ESOPHAGOGASTRODUODENOSCOPY  08/28/2011   Nashanti Duquette-erosive reflux esophagitis, gastric erosions   ESOPHAGOGASTRODUODENOSCOPY (EGD) WITH PROPOFOL  N/A 06/13/2017   Procedure: ESOPHAGOGASTRODUODENOSCOPY (EGD) WITH PROPOFOL ;  Surgeon: Shaaron Lamar HERO, MD;  [facility];  Service: Endoscopy;  Laterality: N/A;   INSERTION OF ILIAC STENT Right 08/05/2023   Procedure: Insertion of Iliac Stent with Iliac Arteriogram;  Surgeon: Gretta Lonni PARAS, MD;  Location: [facility] OR;  Service: Vascular;  Laterality: Right;   INTRAOPERATIVE TRANSESOPHAGEAL ECHOCARDIOGRAM  08/22/2012   Procedure: INTRAOPERATIVE TRANSESOPHAGEAL ECHOCARDIOGRAM;  Surgeon: Dallas KATHEE Jude, MD;  Location: [facility] OR;  Service: Open Heart Surgery;  Laterality: N/A;   IR 3D INDEPENDENT WKST  05/17/2022   IR ANGIO INTRA EXTRACRAN SEL INTERNAL CAROTID UNI R MOD SED  05/17/2022   IR ANGIO INTRA EXTRACRAN SEL INTERNAL CAROTID UNI R MOD SED  12/04/2022   IR ANGIOGRAM FOLLOW UP STUDY  05/17/2022   IR NEURO EACH ADD'L AFTER BASIC UNI RIGHT (MS)  05/17/2022   IR TRANSCATH/EMBOLIZ  05/17/2022   IR US  GUIDE VASC ACCESS RIGHT  05/17/2022   IR US  GUIDE VASC ACCESS RIGHT  12/04/2022   LEFT HEART CATHETERIZATION WITH CORONARY ANGIOGRAM N/A 08/19/2012   Procedure: LEFT HEART CATHETERIZATION WITH CORONARY ANGIOGRAM;  Surgeon: Vinie KYM Maxcy, MD;  Location: [facility];  Service: Cardiovascular;  Laterality: N/A;   LOWER EXTREMITY ANGIOGRAM N/A 11/17/2012   Procedure: LOWER EXTREMITY ANGIOGRAM;  Surgeon: Dorn PARAS Lesches, MD;  Location: [facility] CATH LAB;  Service: Cardiovascular;  Laterality: N/A;   LOWER EXTREMITY ANGIOGRAM  N/A 02/02/2013   Procedure: LOWER EXTREMITY ANGIOGRAM;  Surgeon: Dorn PARAS Lesches, MD;  Location: [facility] CATH LAB;  Service: Cardiovascular;  Laterality: N/A;   LUMBAR DISC SURGERY     LUMBAR FUSION  ~ 2011   PATCH ANGIOPLASTY Right 08/05/2023   Procedure: PATCH ANGIOPLASTY;  Surgeon: Gretta Lonni PARAS, MD;  Location: [facility] OR;  Service: Vascular;  Laterality: Right;   PELVIC ABCESS DRAINAGE     x2   PERIPHERAL VASCULAR ATHERECTOMY  03/30/2021   Procedure: PERIPHERAL VASCULAR ATHERECTOMY;  Surgeon: Lesches Dorn PARAS, MD;  [facility];  Service: Cardiovascular;;  left common iliac artery   PERIPHERAL VASCULAR ATHERECTOMY  04/01/2023   Procedure: PERIPHERAL VASCULAR ATHERECTOMY;  Surgeon: Lesches Dorn PARAS, MD;  Location: [facility] INVASIVE CV LAB;  Service: Cardiovascular;;   PERIPHERAL VASCULAR BALLOON ANGIOPLASTY Left 04/12/2022   Procedure: PERIPHERAL VASCULAR BALLOON ANGIOPLASTY;  Surgeon: Lesches Dorn PARAS, MD;  [facility]: [facility];  Service: Cardiovascular;  Laterality: Left;  SFA   PERIPHERAL VASCULAR BALLOON ANGIOPLASTY Right 04/01/2023   Procedure: PERIPHERAL VASCULAR BALLOON ANGIOPLASTY;  Surgeon: Lesches Dorn PARAS, MD;  [facility]: [facility];  Service: Cardiovascular;  Laterality: Right;  DCB - SFA   PERIPHERAL VASCULAR INTERVENTION  03/30/2021  Procedure: PERIPHERAL VASCULAR INTERVENTION;  Surgeon: Court Dorn PARAS, MD;  Location: [facility] INVASIVE CV LAB;  Service: Cardiovascular;;   POLYPECTOMY  06/13/2017   Procedure: POLYPECTOMY;  Surgeon: Shaaron Lamar HERO, MD;  [facility];  Service: Endoscopy;;  colon   RADIOLOGY WITH ANESTHESIA N/A 05/17/2022   Procedure: Diagnostic Cerebral Angiogram, Possible Aneurysm Coiling, Possible Stent;  Surgeon: Lanis Pupa, MD;  Location: [facility] OR;  Service: Radiology;  Laterality: N/A;   SHOULDER SURGERY Right    TOTAL HIP ARTHROPLASTY Right 12/25/2022   Procedure: TOTAL HIP ARTHROPLASTY;  Surgeon: Josefina Chew,  MD;  Location: [facility] ORS;  Service: Orthopedics;  Laterality: Right;   TOTAL HIP ARTHROPLASTY Right    VASCULAR SURGERY Right 01/02/2013   diamondback orbital rotational  atherectomy, chocolate balloon and IDEV  Stent.    Prior to Admission medications   Medication Sig Start Date End Date Taking? Authorizing Provider  acetaminophen  (TYLENOL ) 650 MG CR tablet Take 1,300 mg by mouth daily as needed for pain.   Yes [provider]  albuterol  (VENTOLIN  HFA) 108 (90 Base) MCG/ACT inhaler Inhale 2 puffs into the lungs every 6 (six) hours as needed for wheezing or shortness of breath.   Yes [provider]  aspirin  EC 81 MG EC tablet Take 1 tablet (81 mg total) by mouth daily. 09/11/18  Yes Tat, Alm, MD  Cholecalciferol (VITAMIN D-3 PO) Take 1 capsule by mouth daily.   Yes [provider]  clopidogrel  (PLAVIX ) 75 MG tablet Take 1 tablet (75 mg total) by mouth daily with breakfast. 05/03/15  Yes Hilty, Vinie BROCKS, MD  docusate sodium  (COLACE) 100 MG capsule Take 100 mg by mouth daily as needed for mild constipation.   Yes [provider]  empagliflozin  (JARDIANCE ) 25 MG TABS tablet Take 25 mg by mouth daily.   Yes [provider]  gabapentin  (NEURONTIN ) 800 MG tablet Take 800 mg by mouth 3 (three) times daily. 05/29/23  Yes [provider]  glipiZIDE  (GLUCOTROL  XL) 10 MG 24 hr tablet Take 10 mg by mouth 2 (two) times daily.   Yes [provider]  icosapent  Ethyl (VASCEPA ) 1 g capsule Take 2 g by mouth 2 (two) times daily.   Yes [provider]  isosorbide  mononitrate (IMDUR ) 60 MG 24 hr tablet Take 1 tablet (60 mg total) by mouth daily. 02/12/23  Yes Ricky Fines, MD  losartan  (COZAAR ) 50 MG tablet Take 50 mg by mouth daily. 04/27/22  Yes [provider]  metoprolol  succinate (TOPROL -XL) 25 MG 24 hr tablet TAKE 1 TABLET BY MOUTH ONCE A DAY. 05/12/24  Yes Court Dorn PARAS, MD  ondansetron  (ZOFRAN ) 4 MG tablet Take 1 tablet (4 mg  total) by mouth every 8 (eight) hours as needed for nausea or vomiting. 12/25/22  Yes Brown, Blaine K, PA-C  oxyCODONE -acetaminophen  (PERCOCET) 10-325 MG tablet Take 1 tablet by mouth 4 (four) times daily as needed for pain. 02/20/21  Yes [provider]  rosuvastatin  (CRESTOR ) 40 MG tablet Take 1 tablet (40 mg total) by mouth daily. 09/10/18  Yes Tat, Alm, MD  thiamine  (VITAMIN B-1) 100 MG tablet Take 1 tablet (100 mg total) by mouth daily. 02/12/23  Yes Ricky Fines, MD  zolpidem  (AMBIEN  CR) 12.5 MG CR tablet Take 12.5 mg by mouth at bedtime. 06/15/20  Yes [provider]    Allergies as of 07/07/2024   (No Known Allergies)    Family History  Problem Relation Age of Onset   Anesthesia problems Neg  Hx    Colon cancer Neg Hx    Gastric cancer Neg Hx    Esophageal cancer Neg Hx    Colon polyps Neg Hx     Social History   Socioeconomic History   Marital status: Single    Spouse name: Not on file   Number of children: 1   Years of education: Not on file   Highest education level: Not on file  Occupational History   Occupation: disabled    Employer: DISABLED  Tobacco Use   Smoking status: Every Day    Current packs/day: 1.50    Average packs/day: 1.5 packs/day for 39.0 years (58.5 ttl pk-yrs)    Types: Cigarettes   Smokeless tobacco: Never  Vaping Use   Vaping status: Never Used  Substance and Sexual Activity   Alcohol use: Yes    Alcohol/week: 12.0 standard drinks of alcohol    Types: 12 Cans of beer per week    Comment: weekly   Drug use: No   Sexual activity: Not Currently  Other Topics Concern   Not on file  Social History Narrative   Lives w/ girlfriend      Right handed   Wears glasses    Drinks sweet tea (rare)   Drinks diet mountain dew    Social Drivers of Health   Financial Resource Strain: Low Risk  (04/10/2024)   Received from [facility]   Overall Financial Resource Strain (CARDIA)    How hard is it for you to pay for the very  basics like food, housing, medical care, and heating?: Not very hard  Food Insecurity: No Food Insecurity (04/10/2024)   Received from [facility]   Hunger Vital Sign    Within the past 12 months, you worried that your food would run out before you got the money to buy more.: Never true    Within the past 12 months, the food you bought just didn't last and you didn't have money to get more.: Never true  Transportation Needs: No Transportation Needs (04/10/2024)   Received from [facility] - Transportation    In the past 12 months, has lack of transportation kept you from medical appointments or from getting medications?: No    In the past 12 months, has lack of transportation kept you from meetings, work, or from getting things needed for daily living?: No  Physical Activity: Inactive (04/10/2024)   Received from [facility]   Exercise Vital Sign    On average, how many days per week do you engage in moderate to strenuous exercise (like a brisk walk)?: 0 days    Minutes of Exercise per Session: Not on file  Stress: No Stress Concern Present (04/10/2024)   Received from [facility] - Occupational Stress Questionnaire    Do you feel stress - tense, restless, nervous, or anxious, or unable to sleep at night because your mind is troubled all the time - these days?: Only a little  Social Connections: Socially Integrated (04/10/2024)   Received from [facility]   Social Network    How would you rate your social network (family, work, friends)?: Good participation with social networks  Intimate Partner Violence: Not At Risk (04/10/2024)   Received from [facility]   HITS    Over the last 12 months how often did your partner physically hurt you?: Never    Over the last 12 months how often did your partner insult  you or talk down to you?: Never    Over the last 12 months how often did your partner threaten you with physical harm?: Never     Over the last 12 months how often did your partner scream or curse at you?: Never    Review of Systems   See HPI, otherwise negative ROS  Physical Exam: BP 138/70 (Cuff Size: Normal)   Pulse 76   Temp (!) 97.5 F (36.4 C)   Ht 5' 11 (1.803 m)   Wt 197 lb 6.4 oz (89.5 kg)   BMI 27.53 kg/m  General:   Alert,  Well-developed, well-nourished, pleasant and cooperative in NAD Neck:  Supple; no masses or thyromegaly. No significant cervical adenopathy. Lungs:  Clear throughout to auscultation.   No wheezes, crackles, or rhonchi. No acute distress. Heart:  Regular rate and rhythm; no murmurs, clicks, rubs,  or gallops. Abdomen: Non-distended, normal bowel sounds.  Soft and nontender without appreciable mass or hepatosplenomegaly.   Impression/Plan:   63 year old gentleman with multiple comorbidities including significant PAD/cerebrovascular disease returns for further evaluation of chronic altered bowel function.  Alternating constipation diarrhea most consistent with a mixed IBS.  Recent colonoscopy findings reassuring.  He has vague symptoms of dyspepsia.  He does not have any symptoms to go with mesenteric ischemia.  Given he has a history of reflux esophagitis he ought to be on daily acid suppression therapy  He may be overshooting he has goals of therapy with bowel function alternating treatment with dicyclomine  and MiraLAX .  Excessive weekly beer consumption may also be exacerbating his bowel symptoms.  History of colonic adenoma; diverticulosis on recent colonoscopy.  Slated for 1 more colonoscopy in 7 years.  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  Alternating constipation and diarrhea.  You also have diverticulosis.  I recommend that you go on a daily fiber supplement like Benefiber beginning with 1 tablespoon daily for a month; then increase to 2 tablespoons thereafter and take every day-indefinitely.  May take MiraLAX  only if you happen  to have significant constipation.  Likewise, if you have an episode of severe diarrhea I would prefer for you to take Imodium and no longer use dicyclomine .  Because you have known reflux esophagitis and upper abdominal discomfort we will start you back on Protonix  40 mg daily (dispense 30 with 11 refills)  Treatment for IBS may change over time depending on symptoms.  There is no cure.  However, we hope to manage your symptoms so that you are good days far out number your bad days  Your beer consumption may be contributing to your bowel issues.  I do recommend you cut back significantly on your weekly beer intake.  Plan to see you back in the office in 6 weeks.       Notice: This dictation was prepared with Dragon dictation along with smaller phrase technology. Any transcriptional errors that result from this process are unintentional and may not be corrected upon review.

## 2024-07-07 NOTE — Patient Instructions (Signed)
 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  Alternating constipation and diarrhea.  You also have diverticulosis.  I recommend that you go on a daily fiber supplement like Benefiber beginning with 1 tablespoon daily for a month; then increase to 2 tablespoons thereafter and take every day-indefinitely.  May take MiraLAX  only if you happen to have significant constipation.  Likewise, if you have an episode of severe diarrhea I would prefer for you to take Imodium and no longer use dicyclomine .  Because you have known reflux esophagitis and upper abdominal discomfort we will start you back on Protonix  40 mg daily (dispense 30 with 11 refills)  Treatment for IBS may change over time depending on symptoms.  There is no cure.  However, we hope to manage your symptoms so that you are good days far out number your bad days  Your beer consumption may be contributing to your bowel issues.  I do recommend you cut back significantly on your weekly beer intake.  Plan to see you back in the office in 6 weeks.

## 2024-07-22 ENCOUNTER — Encounter: Payer: Self-pay | Admitting: Internal Medicine

## 2024-07-22 ENCOUNTER — Ambulatory Visit (HOSPITAL_COMMUNITY): Admission: RE | Admit: 2024-07-22 | Discharge: 2024-07-22 | Attending: Cardiology | Admitting: Cardiology

## 2024-07-22 DIAGNOSIS — I2510 Atherosclerotic heart disease of native coronary artery without angina pectoris: Secondary | ICD-10-CM

## 2024-07-22 DIAGNOSIS — I10 Essential (primary) hypertension: Secondary | ICD-10-CM | POA: Diagnosis present

## 2024-07-22 DIAGNOSIS — Z951 Presence of aortocoronary bypass graft: Secondary | ICD-10-CM | POA: Insufficient documentation

## 2024-07-22 DIAGNOSIS — E782 Mixed hyperlipidemia: Secondary | ICD-10-CM | POA: Insufficient documentation

## 2024-07-22 LAB — ECHOCARDIOGRAM COMPLETE
Area-P 1/2: 4.15 cm2
S' Lateral: 4 cm

## 2024-07-23 ENCOUNTER — Ambulatory Visit: Payer: Self-pay | Admitting: Cardiovascular Disease

## 2024-07-27 ENCOUNTER — Ambulatory Visit (HOSPITAL_COMMUNITY)
Admission: RE | Admit: 2024-07-27 | Discharge: 2024-07-27 | Attending: Cardiovascular Disease | Admitting: Cardiovascular Disease

## 2024-07-27 ENCOUNTER — Ambulatory Visit (HOSPITAL_COMMUNITY)
Admission: RE | Admit: 2024-07-27 | Discharge: 2024-07-27 | Disposition: A | Discharge status: Home / Self Care | Source: Ambulatory Visit | Attending: Cardiovascular Disease | Admitting: Cardiovascular Disease

## 2024-07-27 ENCOUNTER — Ambulatory Visit (HOSPITAL_COMMUNITY)

## 2024-07-27 DIAGNOSIS — E782 Mixed hyperlipidemia: Secondary | ICD-10-CM | POA: Diagnosis not present

## 2024-07-27 DIAGNOSIS — Z720 Tobacco use: Secondary | ICD-10-CM

## 2024-07-27 DIAGNOSIS — I739 Peripheral vascular disease, unspecified: Secondary | ICD-10-CM

## 2024-07-27 DIAGNOSIS — Z95828 Presence of other vascular implants and grafts: Secondary | ICD-10-CM

## 2024-07-27 LAB — VAS US ABI WITH/WO TBI
Left ABI: 1.01
Right ABI: 0.6

## 2024-09-14 ENCOUNTER — Ambulatory Visit: Admitting: Cardiovascular Disease

## 2024-09-23 ENCOUNTER — Ambulatory Visit: Admitting: Urology
